# Patient Record
Sex: Male | Born: 1940
Health system: Southern US, Community
[De-identification: ages and names within clinical notes are randomized; demographics above are authoritative.]

## PROBLEM LIST (undated history)

## (undated) DIAGNOSIS — J984 Other disorders of lung: Secondary | ICD-10-CM

## (undated) DIAGNOSIS — R942 Abnormal results of pulmonary function studies: Secondary | ICD-10-CM

## (undated) DIAGNOSIS — G20 Parkinson's disease: Secondary | ICD-10-CM

## (undated) DIAGNOSIS — R011 Cardiac murmur, unspecified: Secondary | ICD-10-CM

## (undated) DIAGNOSIS — F419 Anxiety disorder, unspecified: Secondary | ICD-10-CM

## (undated) DIAGNOSIS — R202 Paresthesia of skin: Secondary | ICD-10-CM

## (undated) DIAGNOSIS — N529 Male erectile dysfunction, unspecified: Secondary | ICD-10-CM

## (undated) DIAGNOSIS — M1990 Unspecified osteoarthritis, unspecified site: Secondary | ICD-10-CM

## (undated) DIAGNOSIS — E785 Hyperlipidemia, unspecified: Secondary | ICD-10-CM

## (undated) DIAGNOSIS — N400 Enlarged prostate without lower urinary tract symptoms: Secondary | ICD-10-CM

## (undated) DIAGNOSIS — K219 Gastro-esophageal reflux disease without esophagitis: Secondary | ICD-10-CM

## (undated) DIAGNOSIS — I4821 Permanent atrial fibrillation: Secondary | ICD-10-CM

## (undated) DIAGNOSIS — L0390 Cellulitis, unspecified: Secondary | ICD-10-CM

## (undated) DIAGNOSIS — I10 Essential (primary) hypertension: Secondary | ICD-10-CM

## (undated) DIAGNOSIS — J309 Allergic rhinitis, unspecified: Secondary | ICD-10-CM

## (undated) HISTORY — DX: Abnormal results of pulmonary function studies: R94.2

## (undated) HISTORY — PX: COLONOSCOPY: SHX174

## (undated) HISTORY — DX: Anxiety disorder, unspecified: F41.9

## (undated) HISTORY — PX: CARDIOVERSION: SHX1299

## (undated) HISTORY — PX: EYE SURGERY: SHX253

## (undated) HISTORY — DX: Other disorders of lung: J98.4

## (undated) HISTORY — DX: Cellulitis, unspecified: L03.90

## (undated) HISTORY — DX: Hyperlipidemia, unspecified: E78.5

## (undated) HISTORY — DX: Allergic rhinitis, unspecified: J30.9

## (undated) HISTORY — DX: Paresthesia of skin: R20.2

## (undated) HISTORY — DX: Permanent atrial fibrillation: I48.21

## (undated) HISTORY — PX: CATARACT EXTRACTION: SUR2

## (undated) HISTORY — DX: Unspecified osteoarthritis, unspecified site: M19.90

## (undated) HISTORY — DX: Benign prostatic hyperplasia without lower urinary tract symptoms: N40.0

## (undated) HISTORY — DX: Essential (primary) hypertension: I10

## (undated) HISTORY — DX: Parkinson's disease: G20

## (undated) HISTORY — DX: Gastro-esophageal reflux disease without esophagitis: K21.9

## (undated) HISTORY — DX: Male erectile dysfunction, unspecified: N52.9

## (undated) HISTORY — PX: JOINT REPLACEMENT: SHX530

---

## 1998-07-12 ENCOUNTER — Encounter: Payer: Self-pay | Admitting: Pulmonary Disease

## 1998-07-12 ENCOUNTER — Ambulatory Visit (HOSPITAL_COMMUNITY): Admission: RE | Admit: 1998-07-12 | Discharge: 1998-07-12 | Payer: Self-pay | Admitting: Pulmonary Disease

## 1999-12-03 ENCOUNTER — Inpatient Hospital Stay (HOSPITAL_COMMUNITY): Admission: EM | Admit: 1999-12-03 | Discharge: 1999-12-07 | Payer: Self-pay | Admitting: *Deleted

## 1999-12-03 ENCOUNTER — Encounter: Payer: Self-pay | Admitting: *Deleted

## 1999-12-04 ENCOUNTER — Encounter: Payer: Self-pay | Admitting: Pulmonary Disease

## 2001-04-08 ENCOUNTER — Ambulatory Visit (HOSPITAL_COMMUNITY): Admission: RE | Admit: 2001-04-08 | Discharge: 2001-04-08 | Payer: Self-pay | Admitting: Cardiology

## 2001-08-18 ENCOUNTER — Ambulatory Visit (HOSPITAL_COMMUNITY): Admission: RE | Admit: 2001-08-18 | Discharge: 2001-08-18 | Payer: Self-pay | Admitting: Cardiology

## 2001-09-16 ENCOUNTER — Ambulatory Visit (HOSPITAL_COMMUNITY): Admission: RE | Admit: 2001-09-16 | Discharge: 2001-09-16 | Payer: Self-pay | Admitting: Gastroenterology

## 2003-06-23 ENCOUNTER — Inpatient Hospital Stay (HOSPITAL_COMMUNITY): Admission: AD | Admit: 2003-06-23 | Discharge: 2003-06-27 | Payer: Self-pay | Admitting: Cardiology

## 2003-08-07 ENCOUNTER — Encounter: Admission: RE | Admit: 2003-08-07 | Discharge: 2003-08-07 | Payer: Self-pay | Admitting: Cardiology

## 2003-08-08 ENCOUNTER — Ambulatory Visit (HOSPITAL_COMMUNITY): Admission: RE | Admit: 2003-08-08 | Discharge: 2003-08-08 | Payer: Self-pay | Admitting: Cardiology

## 2003-08-14 ENCOUNTER — Ambulatory Visit (HOSPITAL_COMMUNITY): Admission: RE | Admit: 2003-08-14 | Discharge: 2003-08-14 | Payer: Self-pay | Admitting: Cardiology

## 2003-12-06 ENCOUNTER — Ambulatory Visit (HOSPITAL_COMMUNITY): Admission: RE | Admit: 2003-12-06 | Discharge: 2003-12-06 | Payer: Self-pay | Admitting: Cardiology

## 2004-07-19 ENCOUNTER — Ambulatory Visit (HOSPITAL_COMMUNITY): Admission: RE | Admit: 2004-07-19 | Discharge: 2004-07-19 | Payer: Self-pay | Admitting: Cardiology

## 2004-08-12 ENCOUNTER — Encounter: Admission: RE | Admit: 2004-08-12 | Discharge: 2004-08-12 | Payer: Self-pay | Admitting: Internal Medicine

## 2004-08-20 ENCOUNTER — Encounter: Admission: RE | Admit: 2004-08-20 | Discharge: 2004-08-20 | Payer: Self-pay | Admitting: Internal Medicine

## 2005-12-19 ENCOUNTER — Ambulatory Visit (HOSPITAL_COMMUNITY): Admission: RE | Admit: 2005-12-19 | Discharge: 2005-12-19 | Payer: Self-pay | Admitting: Cardiology

## 2006-10-15 ENCOUNTER — Encounter: Admission: RE | Admit: 2006-10-15 | Discharge: 2006-10-15 | Payer: Self-pay | Admitting: Internal Medicine

## 2007-02-09 ENCOUNTER — Ambulatory Visit (HOSPITAL_COMMUNITY): Admission: RE | Admit: 2007-02-09 | Discharge: 2007-02-09 | Payer: Self-pay | Admitting: Cardiology

## 2008-03-14 ENCOUNTER — Ambulatory Visit (HOSPITAL_COMMUNITY): Admission: RE | Admit: 2008-03-14 | Discharge: 2008-03-14 | Payer: Self-pay | Admitting: Cardiology

## 2008-03-14 ENCOUNTER — Encounter: Payer: Self-pay | Admitting: Pulmonary Disease

## 2008-05-15 ENCOUNTER — Ambulatory Visit: Payer: Self-pay | Admitting: Pulmonary Disease

## 2008-05-15 DIAGNOSIS — I10 Essential (primary) hypertension: Secondary | ICD-10-CM

## 2008-05-15 DIAGNOSIS — R942 Abnormal results of pulmonary function studies: Secondary | ICD-10-CM | POA: Insufficient documentation

## 2008-05-15 DIAGNOSIS — E785 Hyperlipidemia, unspecified: Secondary | ICD-10-CM

## 2008-05-18 ENCOUNTER — Ambulatory Visit: Payer: Self-pay | Admitting: Internal Medicine

## 2008-05-18 ENCOUNTER — Ambulatory Visit: Payer: Self-pay | Admitting: Pulmonary Disease

## 2008-05-29 ENCOUNTER — Encounter: Payer: Self-pay | Admitting: Pulmonary Disease

## 2008-06-02 ENCOUNTER — Ambulatory Visit: Payer: Self-pay | Admitting: Pulmonary Disease

## 2008-06-02 DIAGNOSIS — R0989 Other specified symptoms and signs involving the circulatory and respiratory systems: Secondary | ICD-10-CM

## 2008-06-02 DIAGNOSIS — R0609 Other forms of dyspnea: Secondary | ICD-10-CM

## 2008-06-02 DIAGNOSIS — J984 Other disorders of lung: Secondary | ICD-10-CM

## 2008-11-30 ENCOUNTER — Ambulatory Visit: Payer: Self-pay | Admitting: Pulmonary Disease

## 2008-11-30 ENCOUNTER — Ambulatory Visit: Payer: Self-pay | Admitting: Cardiology

## 2008-12-01 ENCOUNTER — Encounter: Payer: Self-pay | Admitting: Pulmonary Disease

## 2009-11-22 HISTORY — PX: CARDIAC CATHETERIZATION: SHX172

## 2010-07-31 ENCOUNTER — Encounter: Payer: Self-pay | Admitting: Internal Medicine

## 2010-08-06 ENCOUNTER — Ambulatory Visit (HOSPITAL_COMMUNITY)
Admission: RE | Admit: 2010-08-06 | Discharge: 2010-08-06 | Disposition: A | Discharge status: Home / Self Care | Payer: Medicare Other | Source: Ambulatory Visit | Attending: Cardiology | Admitting: Cardiology

## 2010-08-06 DIAGNOSIS — I4891 Unspecified atrial fibrillation: Secondary | ICD-10-CM | POA: Insufficient documentation

## 2010-08-06 LAB — PROTIME-INR
INR: 2.2 — ABNORMAL HIGH (ref 0.00–1.49)
Prothrombin Time: 24.6 seconds — ABNORMAL HIGH (ref 11.6–15.2)

## 2010-08-06 LAB — MAGNESIUM: Magnesium: 1.7 mg/dL (ref 1.5–2.5)

## 2010-08-09 NOTE — Discharge Summary (Signed)
[place]. [facility]  Patient:    Brian [name], Brian [name]                        MRN: [id number] Adm. Date:  [id number] Disc. Date: [id number] Attending:  Eino Farber Dictator:   Dionne D. Su Hilt, N.P.                           Discharge Summary  ADMISSION DIAGNOSES: 1. Atrial fibrillation. 2. Arrhythmias with sinus pauses. 3. Hypertensive cardiovascular disease. 4. Hyperlipidemia. 5. Hypokalemia.  DISCHARGE DIAGNOSES: 1. Atrial fibrillation converted to sinus rhythm. 2. Hypertensive cardiovascular disease. 3. Hypercholesterolemia. 4. Hyperkalemia. 5. Obesity, moderate.  REASON FOR HOSPITALIZATION:  This is a 70 year old black male who states he did three days of hard workout Thursday, Friday, and Saturday.  States that he normally has days off between workout.  He noticed on Friday evening as well as Saturday and Sunday after where he thought his heart was beating fast. States it was more ______ pounding in his throat.  He states he felt like his heart was out of synch with his body, did not quite feel right.  These episodes usually came on when he was doing something strenuous.  Also when he was up the steps, lasted a few minutes, and subsided on their own.  He had a similar episode 10 years ago when he was in [place].  He had an irregular heart rate, but the heart spontaneously converted to a normal sinus rhythm. Two years ago he states this is in his record with Dr. Petra Kuba.  He noticed over the last two or three days he has been sweating.  He has taken a lot longer to cool down.  He had to get in front of the air conditioner to cool down while he was at home after his shower.  He had no nausea or vomiting.  ALLERGIES:  No known drug allergies.  LABORATORY DATA AND STUDIES:  On December 03, 1999, EKG showed atrial fibrillation.  Chest x-ray on December 03, 1999, showed myocardial ______, but no active disease.  On December 03, 1999,  WBCs 4.5, hemoglobin 15.1, hematocrit 42.0, platelets 215.  Chemistry studies on December 03, 1999, showed sodium of 139, potassium 3.1, chloride 102, CO2 28, glucose 99, BUN 21, creatinine 1.2, calcium 9.7, total protein 7.2, albumin 3.8, AST 27, ALT 24, ALP 51, total bilirubin 0.7.  On December 07, 1999, chemistry studies showed a sodium of 135, potassium of 4.2, chloride 102, CO2 28, glucose 98, BUN 12, creatinine 1.2, calcium 8.8.  Cardiac enzymes on December 03, 1999, showed a CK of 189, CK-MB 1.8, index of 1.0, troponin less than 0.03.  On December 03, 1999, T4 was 8.2, TSH 1.2, T3 150.4.  HOSPITAL COURSE:  The patient was admitted to a telemetry bed.  The patient was started on Prinivil and Lipitor and a baby aspirin, and Lovenox 60 mg q.12h.  Also he received Lopressor 25 mg p.o. q.12h.  Cardiac enzymes were obtained with troponins.  The patient had an episode of hypokalemia.  He was given potassium supplement orally and intravenously.  Chemistry studies were drawn and monitored at intervals.  Cardiology consult was done by Dr. Donia Guiles.  Adenosine Cardiolite was obtained, and also a 2-D echo.  Thyroid profile was also drawn.  The patient had an episode of bradycardia.  Lopressor was held.  The patient was started on Coumadin  6 mg.  He also had a TEE done with cardioversion.  Lopressor was restarted 25 mg tab p.o. q.d.  CONDITION ON DISCHARGE:  The patient was discharged home in stable condition. The patient is doing well and tolerating medications post cardioversion.  DISCHARGE MEDICATIONS: 1. Coumadin 5 mg tab one p.o. q.d. 2. Lopressor 50 mg tab 1/2 tab q.d. 3. Prinivil 20 mg tab one q.d. 4. KCL 20 mEq tab one q.d. 5. Baby aspirin 81 mg tab one q.d. 6. Lipitor 10 mg tab one q.d.  ACTIVITY:  Ambulate as tolerated.  DIET:  Low fat, low cholesterol diet, no added salt diet.  FOLLOWUP APPOINTMENT:  In 7 to 10 days.  Need blood, chemistry, CBC,  and prothrombin. DD:  01/14/00 TD:  01/15/00 Job: [id number]

## 2010-08-09 NOTE — H&P (Signed)
[place]. [facility]  Patient:    Brian [name], Brian [name] Visit Number: [id number] MRN: [id number]          Service Type: CAT Location: [facility] 2871 01 Attending Physician:  Armanda Magic Dictated by:   Anselm Lis, N.P. Admit Date:  04/08/2001 Discharge Date: 04/08/2001                           History and Physical  DATE OF BIRTH:  1940/10/30  PRIMARY CARE Leyana Whidden:  Dr. Tyson Dense.  DATE OF PROCEDURE:  April 08, 2001  PROBLEMS:  1. History of paroxysmal atrial fibrillation.     a. Episode in [place] about eight years ago.     b. (September 2001) [facility] admission where he underwent direct-current        cardioversion (Dr. Doylene Canning. Ladona Ridgel).  Stress Cardiolite,        2-D echocardiogram were done at that time.     c. March 12, 2001, 2-D echocardiogram revealing normal left ventricular        cavity dimensions with moderate left ventricular hypertrophy.        Moderate-to-marked left atrial enlargement.  Upper-limits-of-normal        right ventricular size and mild right atrial enlargement.  Mild aortic        valve sclerosis, no stenosis.  Mildly thickened anterior mitral valve        leaflet with trace mitral regurgitation.  Mild tricuspid regurgitation.        Left atrial enlargement -- 57 mm.  In comparison with        2-D echocardiogram, September 2001, left atrium appears to have        increased in size.     d. Recent stress Cardiolite revealing decreased uptake, inferior wall,        with stress only.  ST segment depression at peak exercise.  No        complaint of chest pain nor shortness of breath.  2. History of hypertension for approximately 10 years.  3. History of dyslipidemia, treated for two years.  4. History of gastroesophageal reflux disease, treated for Helicobacter     pylori about 10 years earlier.  CURRENT MEDICATIONS:  1. Lopressor 50 mg p.o. q.h.s.  2. Lipitor 10 mg p.o. q.d.  3. Lisinopril/HCTZ 20/25 mg p.o.  q.d.  4. Coumadin -- last dose April 04, 2001.  5. Tylenol PM one dose every evening and one dose prior to his     four-to-five-times-per-week cardiovascular exercises.  HISTORY OF PRESENT ILLNESS:  [name] is a very pleasant 70 year old gentleman with a history of paroxysmal atrial fibrillation as detailed above. He had recurrence of his atrial fibrillation sometime post cardioversion, September 2001.  He has been on systemic anticoagulation for this.  He had an echocardiogram and stress Cardiolite recently.  The Cardiolite was suspicious for inferior ischemia.  The echo revealed moderate left ventricular hypertrophy and marked left atrial enlargement thought likely secondary to underlying hypertension.  He has been counseled for and has agreed to undergo coronary angiography.  ALLERGIES:  No known drug allergies.  No problem with seafood, shellfish nor with iodinated products.  SOCIAL HISTORY AND HABITS:  Patient has been married for 18 years (second marriage).  He has four children, ages 49, 70, 52, 64, all alive and well. Smoking/ETOH:  Negative.  Exercise:  Works out four to five times per week with cardiovascular  exercises.  Caffeine:  None.  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  REVIEW OF SYSTEMS:  He does have episodic transient light-headedness with fast position changes.  Denies anterior chest pressure nor tightness.  Negative dysphagia to food or fluid.  Negative symptoms of GERD.  Good dentition though he does have upper and lower partials.  Denies melena nor bright red blood PR. Negative constipation nor diarrhea.  He did recover recently from a 24-hour intestinal flu bug 2 days earlier.  Negative dysuria nor hematuria. Arthritic-type complaints:  Just early morning stiffness; it is improved if he takes Tylenol PM the night before.  Denies pedal edema, orthopnea, PND nor DOE.  He does snore but no apneic periods per his  wife.  PHYSICAL EXAMINATION:  (As performed by Dr. Armanda Magic.)  VITAL SIGNS:  Blood pressure is 122/72, heart rate regular, respiratory rate 12.  He is afebrile.  His weight is 250 pounds.  He is 6 feet 2 inches.  GENERAL:  He is a well-nourished 70 year old gentleman in no apparent distress.  His wife is in attendance.  HEENT/NECK:  Brisk bilateral carotid upstrokes without bruits.  No significant JVD nor thyromegaly.  CARDIAC:  Regular rate and rhythm without murmur, rub nor gallop.  Normal S1 and S2.  CHEST:  Lung sounds clear with equal bilateral excursion.  Negative CVA tenderness.  ABDOMEN:  Soft, nondistended, normoactive bowel sounds.  Negative abdominal aortic, renal nor femoral bruit.  Nontender to applied pressure.  No masses nor organomegaly appreciated.  EXTREMITIES:  Distal pulses intact.  Negative pedal edema.  Pedal pulses are decreased.  NEUROLOGIC:  Cranial nerves II-XII grossly intact, alert and oriented x3.  GENITAL:  Deferred.  RECTAL:  Deferred.  LABORATORY TESTS AND DATA:  WBC of 3.8, hemoglobin 14.6, hematocrit of 43.1, platelets of 183,000; those labs were from today.  From March 30, 2001, glucose of 123, BUN of 31, creatinine 1.5, sodium 136, potassium of 4.3, chloride of 98, CO2 of 28, calcium of 9.1; hemoglobin 14.8, hematocrit of 43.1, platelets of 201,000, WBC 4.3.  Pro time 15.5, INR of 1.73, PTT of 33.  Chest x-ray was done today and is pending.  EKG from February 11, 2001 revealed atrial fibrillation with controlled ventricular response, 78 beats per minute, without ischemic changes.  IMPRESSION:  (As dictated by Dr. Armanda Magic.)  1. History of paroxysmal atrial fibrillation, now recurrent and continuous.  2. Systemic anticoagulation secondary to atrial fibrillation; last dose taken     four day prior to today.  3. History of hyperlipidemia; on Lipitor.  4. History of hypertension, good control on current medical regimen.  5.  Recent abnormal stress Cardiolite suggestive of inferior ischemia with      some diagnostic electrocardiographic changes of ischemia at peak exercise.  PLAN:  (As dictated by Dr. Armanda Magic.)  1. Patient was counseled to undergo and has accepted plans for coronary     angiography with possible percutaneous intervention if indicated and able.     Risks, potential complications, benefits and alternatives to procedure     were discussed in detail.  Mr. Dibartolo and his wife indicate their questions     and concerns have been addressed and are agreeable to proceed.  2. Awaiting STAT PT/INR results before proceeding. Dictated by:   Anselm Lis, N.P. Attending Physician:  Armanda Magic DD:  04/08/01 TD:  04/08/01 Job: [id number]

## 2010-08-09 NOTE — Procedures (Signed)
[place]. [facility]  Patient:    Brian, [name] Visit Number: [id number] MRN: [id number]          Service Type: END Location: ENDO Attending Physician:  Dennison Bulla Ii Dictated by:   Verlin Grills, M.D. Proc. Date: 09/16/01 Admit Date:  09/16/2001 Discharge Date: 09/16/2001                             Procedure Report  REFERRING PHYSICIAN:  Tyson Dense, M.D.  PROCEDURE:  Screening colonoscopy.  PROCEDURE INDICATION:  Brian [name] is a 70 year old male who is referred for his first screening colonoscopy with polypectomy to prevent colon cancer.  ENDOSCOPIST:  Verlin Grills, M.D.  PREMEDICATION:  Versed 10 mg, fentanyl 50 mcg.  ENDOSCOPE:  Olympus pediatric colonoscope.  DESCRIPTION OF PROCEDURE:  After obtaining informed consent, Brian [name] was placed in the left lateral decubitus position.  I administered intravenous fentanyl and intravenous Versed to achieve conscious sedation for the procedure.  The patients blood pressure, oxygen saturation, and cardiac rhythm were monitored throughout the procedure and documented in the medical record.  Anal inspection was normal.  Digital rectal exam revealed a non-nodular prostate.  The Olympus pediatric video colonoscope was introduced into the rectum and easily advanced to the cecum.  Colonic preparation for the exam today was excellent.  Rectum normal.  Sigmoid colon and descending colon normal.  Splenic flexure normal.  Transverse colon normal.  Hepatic flexure normal.  Ascending colon normal.  Cecum and ileocecal valve normal.  ASSESSMENT:  Normal screening proctocolonoscopy to the cecum.  No endoscopic evidence for the presence of colorectal neoplasia. Dictated by:   Verlin Grills, M.D. Attending Physician:  Dennison Bulla Ii DD:  09/16/01 TD:  09/18/01 Job: [id number]

## 2010-08-09 NOTE — Op Note (Signed)
NAME:  Brian [name], Brian [name]                           ACCOUNT NO.:  [id number]   MEDICAL RECORD NO.:  [id number]                   PATIENT TYPE:  OIB   LOCATION:  2899                                 FACILITY:  [facility]   PHYSICIAN:  Armanda Magic, M.D.                  DATE OF BIRTH:  1940/04/15   DATE OF PROCEDURE:  08/14/2003  DATE OF DISCHARGE:  08/14/2003                                 OPERATIVE REPORT   REFERRING PHYSICIAN:  Georgann Housekeeper, M.D.   PROCEDURE:  Direct current cardioversion.   OPERATOR:  Armanda Magic, M.D.   INDICATIONS FOR PROCEDURE:  Atrial fibrillation with therapeutic INR.   COMPLICATIONS:  None.   MEDICATIONS:  Pentothal 375 mg.   This is a 70 year old male with a history of paroxysmal atrial fibrillation  with multiple attempts at keeping in sinus rhythm after cardioversion.  He  is now status post Amiodarone loading and has a therapeutic INR and presents  for cardioversion.  The patient was brought to the hospital in a fasting  nonsedated state.  Informed consent was obtained.  The patient was connected  to continuous heart rate and pulse oximetry monitoring and intermittent  blood pressure monitoring.  After adequate anesthesia was obtained, a  synchronized biphasic 100 joule shock was delivered.  It was unsuccessful at  converting.  A second 150 joule synchronized biphasic shock was then  delivered successfully converting the patient to normal sinus rhythm.  The  patient tolerated the procedure well.   ASSESSMENT:  1. Atrial fibrillation.  2. Systemic anticoagulation with therapeutic INR for greater than four     weeks.  3. Successful direct current cardioversion to sinus rhythm.   PLAN:  Discharge to home after ambulating and fully awake.  Follow up with  Dr. Mayford Knife in three weeks.  Decrease Coumadin to 5 mg every other day  alternating with 2.5 mg every other day due to INR being 3.9 today.  PT and  INR check on Thursday in our Coumadin clinic.                                             Armanda Magic, M.D.   TT/MEDQ  D:  08/14/2003  T:  08/14/2003  Job:  [id number]   cc:   Georgann Housekeeper, M.D.  [address]  [place]  [place] [address]  Fax: [phone]

## 2010-08-09 NOTE — Procedures (Signed)
[facility]  Patient:    Brian [name], Brian [name] Visit Number: [id number] MRN: [id number]          Service Type: CAT Location: [facility] 2853 01 Attending Physician:  Armanda Magic Dictated by:   Armanda Magic, M.D. Proc. Date: 08/18/01 Admit Date:  08/18/2001 Discharge Date: 08/18/2001   CC:         Tyson Dense, M.D.   Procedure Report  DATE OF BIRTH:  Jan 01, 1941.  PROCEDURE:  Direct current cardioversion.  OPERATOR:  Armanda Magic, M.D.  INDICATION:  Atrial fibrillation on Coumadin, with INR greater than or equal to 2 for four weeks.  COMPLICATIONS:  None.  IV MEDICATIONS:  Pentothal 325 mg IV.  CLINICAL NOTE:  This is a 70 year old black male with a history of atrial fibrillation.  He underwent cardiac catheterization showing normal coronary arteries and normal LV systolic function.  Left atrium is moderately enlarged at 57 mm by 2 D echocardiogram.  He also has moderate LVH and normal systolic function.  He has been on Lopressor and Coumadin.  Once therapeutic with an INR of greater than or equal to 2 for four weeks, he now presents for DC cardioversion.  DESCRIPTION OF PROCEDURE:  The patient was in the fasting, nonsedated state, and was connected to continuous heart rate and pulse oximetry monitor and intermittent blood pressure monitoring.  After informed consent was obtained, anesthesia administered pentothal 325 mg IV.  After adequate anesthesia was obtained, a synchronized 150-joule shock was delivered, which successfully converted the patient to sinus bradycardia with heart rates of 50-60 beats per minute.  The patient tolerated the procedure well.  ASSESSMENT: 1. Atrial fibrillation. 2. Systemic anticoagulation. 3. Status post successful direct current cardioversion to normal sinus rhythm,    sinus bradycardia, heart rates 49-60 beats per minute.  PLAN:  Discharge home after fully awake.  Decrease Lopressor to 12.5 mg b.i.d., and  follow up with Dr. Mayford Knife in two weeks. Dictated by:   Armanda Magic, M.D. Attending Physician:  Armanda Magic DD:  08/18/01 TD:  08/19/01 Job: [id number]

## 2010-08-09 NOTE — Cardiovascular Report (Signed)
[place]. [facility]  Patient:    Brian [name], Brian [name] Visit Number: [id number] MRN: [id number]          Service Type: CAT Location: [facility] 2871 01 Attending Physician:  Armanda Magic Dictated by:   Armanda Magic, M.D. Proc. Date: 04/08/01 Admit Date:  04/08/2001 Discharge Date: 04/08/2001   CC:         Tyson Dense, M.D.   Cardiac Catheterization  REFERRING PHYSICIAN: Tyson Dense, M.D.  HISTORY OF PRESENT ILLNESS: This is very pleasant 70 year old black male, who initially saw me for a paroxysmal atrial fibrillation. He was noted to have an abnormal stress test with significant ST segment depression at peak exercise. A 2-D echocardiogram showed moderate LV hypertrophy with moderate to marked left atrial enlargement with thickening of the mitral valve leaflet and trace MR. No mitral valve prolapse. He had an abnormal Cardiolite study and now presents for cardiac catheterization.  PROCEDURES: Left heart catheterization, coronary angiography, left ventriculography.  INDICATIONS: Atrial fibrillation, abnormal Cardiolite.  INTERVENOUS ACCESS: Via right femoral artery, 6 French sheath.  COMPLICATIONS: None.  DESCRIPTION OF PROCEDURE: The patient is brought to the cardiac catheterization laboratory in a fasting, nonsedated state.  Informed consent was obtained.  The patient was connected to continuous heart rate and pulse oximetry monitoring, and intermittent blood pressure monitoring.  The right groin was prepped and draped in a sterile fashion.  Xylocaine 1% was used for local anesthesia.  Using the modified Seldinger technique, a 6 French sheath was placed in the right femoral artery.  Under fluoroscopic guidance, a 6 [place] JL4 catheter was placed in the left coronary artery. Under fluoroscopic guidance multiple cine films were taken in a 30 degree RAO, 40 degree LAO views.  This catheter was then exchanged out over a wire and a 6 [place] JR4 catheter  which was placed under fluoroscopic guidance in the right coronary artery. Multiple cine films were taken in a 30 degree RAO, 40 degree LAO views. This catheter was then exchanged out over a guide wire for a 6 French angled pigtail catheter which was placed under fluoroscopic guidance in the left ventricular cavity. Left ventriculography was performed in a 30 degree RAO view using a total of 30 cc of contrast at 13 cc/sec.  The catheter was then pulled back across the aortic valve with no significant gradient noted. At the end of the procedure, all catheters and sheaths were removed. Manual compression was performed until adequate hemostasis was obtained. The patient was transferred back to his room in stable condition.  RESULTS: 1. The left main coronary artery is widely patent and trifurcates into    a left anterior descending artery, ramus branch and left circumflex    artery. The ramus branch is widely patent and proximally bifurcates into    two large daughter branches which are widely patent. 2. The left anterior descending artery is widely patent proximally and    gives rise to two diagonal branches. In between the first and second    diagonal there is a 30% narrowing, otherwise widely patent. 3. The left circumflex is widely patent throughout its course and gives rise    to two obtuse marginal branches which are widely patent. 4. The right coronary artery is widely patent throughout its course and    bifurcates distally in the posterior descending artery and posterolateral    artery, both of which are widely patent.  LEFT VENTRICULOGRAM: The left ventriculography performed in the 30 degree RAO vies using a  total of 30 cc of contrast at 13 cc/sec. showed low-normal LV function with an EF of 50-55% and mild mitral regurgitation.  ASSESSMENT: 1. Paroxysmal atrial fibrillation. 2. Nonobstructive coronary disease. 3. Low-normal left ventricular function. 4. Mild mitral  regurgitation with a history of thickened anterior    mitral valve leaflet by echocardiogram.  PLAN: 1. Discharge to home after IV fluid and bed rest. 2. Restart Coumadin tonight. 3. Followup with Dr. Mayford Knife in two weeks. 4. PT/INR check on Monday. 5. SBE prophylaxis secondary to MR and thickened mitral valve leaflet. Dictated by:   Armanda Magic, M.D. Attending Physician:  Armanda Magic DD:  04/08/01 TD:  04/09/01 Job: [id number]

## 2010-08-09 NOTE — Discharge Summary (Signed)
NAME:  RICHARDSON, DUBREE                           ACCOUNT NO.:  [id number]   MEDICAL RECORD NO.:  [id number]                   PATIENT TYPE:  INP   LOCATION:  3736                                 FACILITY:  [facility]   PHYSICIAN:  Armanda Magic, M.D.                  DATE OF BIRTH:  1940-10-07   DATE OF ADMISSION:  06/23/2003  DATE OF DISCHARGE:  06/27/2003                                 DISCHARGE SUMMARY   HISTORY OF PRESENT ILLNESS AND REASON FOR ADMISSION:  Mr. Sowles is a 70-year-  old male patient with a known history of recurrent paroxysmal atrial  fibrillation.  He had cardioversion done in 2001 and back in September 2004  was maintaining sinus rhythm.  Subsequently the patient had been  experiencing palpitations and after evaluation per Dr. Mayford Knife was found to  be back in atrial fibrillation.  He was placed on Coumadin and his rate was  well controlled with Lopressor.  After discussion, the patient opted to come  into the hospital for Betapace loading in attempt for chemical cardioversion  with Betapace.  He was also made aware that if he had not converted on his  own, that Dr. Mayford Knife will consider doing an electric cardioversion prior to  discharge.  Now that the patient's INR was greater than 2.0, he therefore  was admitted to [facility] for Betapace loading.   HOSPITAL COURSE:  Recurrent atrial fibrillation - Admission for Betapace  loading.  The patient was admitted to the general telemetry unit where he  was started on Betapace 80 mg b.i.d.  By the next day, it was noted that he  had subsequently developed significant bradyarrhythmias after first dose of  Betapace.  Betapace was subsequently held for 24 hours.  If his heart rate  continued to decrease, there was some consideration about changing the  patient over to amiodarone.  He continued to have episodes of bradycardia  and pauses and therefore Betapace was discontinued and he was started on  amiodarone 400 mg b.i.d.  As per  routine, TSH, LFTs, PFTs were done pre  treatment and daily EKGs were monitored to follow QTC.  The patient remained  in atrial fibrillation without any further episodes of significant  bradyarrhythmias or pauses.  He had one short episode of heart rate in the  40s while sleeping.  He remained therapeutic on his INR.  Due to one episode  of decreased heart rate into the 40s amiodarone was decreased to daily  dosing.  While the patient went to the respiratory therapy department for  PFTs measurements, he experienced transient uncontrolled ventricular rate  with a heart rate in the 130s.  He also experienced wide-complex tachycardia  that Dr. Mayford Knife later determined to be atrial fibrillation with aberrant  conduction.  Subsequently the patient has remained stable with daily  amiodarone.  QTC was 438 ms on April 5.  At that  point Dr. Mayford Knife determined  that the patient was safe for discharge home and discharge recommendations  will be noted in the discharge instructions later in the dictation.   FINAL DISCHARGE DIAGNOSES:  1. Paroxysmal atrial fibrillation with recurrent atrial fibrillation, status     post amiodarone loading.  2. Hypertension, controlled.  3. Dyslipidemia.  4. Erectile dysfunction.  5. Chronic anticoagulation.   DISCHARGE MEDICATIONS:  1. Pravachol 40 mg daily.  2. Amiodarone 200 mg daily.  3. Avalide 300/12.5 mg daily.  4. Coumadin 5 mg daily.  5. Stop Diovan 160/HCT 12.5 for now.  6. Stop Lipitor.  7. Stop Lopressor.   ACTIVITY:  As previous.   DIET:  Cardiac with Coumadin precautions, avoid excessive intake of leafy,  green vegetables.   FOLLOW-UP APPOINTMENTS:  1. Dr. Mayford Knife, Thursday, April 21, at 11:30 a.m.  2. EKG Friday at the [facility] Office on April 8, at 10:30 a.m.  3. Coumadin Clinic, Friday, April 15, at 2:00 p.m.  4. Statin panel in six weeks on Tuesday, May 17.  The patient has been     instructed to have nothing to eat or drink after  midnight before the     statin and nothing to eat or drink the morning of the lab.      Allison L. Ulla Gallo, M.D.    ALE/MEDQ  D:  07/24/2003  T:  07/25/2003  Job:  [id number]

## 2010-08-13 NOTE — Op Note (Signed)
  NAMELEO, FRAY                 ACCOUNT NO.:  [id number]  MEDICAL RECORD NO.:  [id number]           PATIENT TYPE:  O  LOCATION:  [facility]                         FACILITY:  [facility]  PHYSICIAN:  Armanda Magic, M.D.     DATE OF BIRTH:  12-26-1940  DATE OF PROCEDURE: DATE OF DISCHARGE:  08/06/2010                              OPERATIVE REPORT   REFERRING PHYSICIAN:  Georgann Housekeeper, MD  PROCEDURE:  Direct current cardioversion.  OPERATION:  Armanda Magic, MD  INDICATIONS:  Atrial fibrillation.  COMPLICATIONS:  None.  IV MEDICATIONS:  Propofol 150 mg.  This is a 70 year old African American male with a history of paroxysmal atrial fibrillation status post 2 cardioversions in the past, hypertension, normal coronary arteries in the past, dyslipidemia, GERD who presented for routine office visit and was found to be back in atrial fibrillation.  He was asymptomatic except for some occasional numbness and tingling in his face and hands.  He has been on systemic anticoagulation for some time and he has had therapeutic INRs over the past several months.  He now presents for cardioversion.  The patient was brought to the day hospital in a fasting nonsedated state.  Informed consent was obtained.  The patient was connected to continuous heart rate, pulse oximetry monitoring and intermittent blood pressure monitor.  Defibrillator pads were placed on the left anterior chest and posterior back.  After adequate anesthesia was obtained, a 150 joules synchronized biphasic shock was delivered which was unsuccessful in converting the patient to sinus rhythm.  A 200 joules synchronized biphasic shock was then delivered which again was unsuccessful in converting the patient to sinus rhythm.  A second attempt to the 200 joules synchronized biphasic shock was delivered again unsuccessful in converting the patient to sinus rhythm.  The patient tolerated the procedure well without complications.  He was  later discharged to home. 1. Atrial fibrillation. 2. Systemic anticoagulation with therapeutic INR greater than 4 weeks. 3. Hypertension.  PLAN:  We will need to go on antiarrhythmic drug therapy.  We will check an outpatient Lexiscan Cardiolite and 2-D echocardiogram.  If these are normal, then we will start outpatient flecainide with subsequent cardioversion in 2-3 weeks.     Armanda Magic, M.D.     TT/MEDQ  D:  08/06/2010  T:  08/07/2010  Job:  [id number]  Electronically Signed by Armanda Magic M.D. on 08/13/2010 01:41:03 PM

## 2010-08-14 ENCOUNTER — Encounter: Payer: Self-pay | Admitting: Internal Medicine

## 2010-08-20 ENCOUNTER — Encounter: Payer: Self-pay | Admitting: Internal Medicine

## 2010-09-09 ENCOUNTER — Encounter: Payer: Self-pay | Admitting: Internal Medicine

## 2010-09-10 ENCOUNTER — Ambulatory Visit (HOSPITAL_COMMUNITY)
Admission: RE | Admit: 2010-09-10 | Discharge: 2010-09-10 | Disposition: A | Discharge status: Home / Self Care | Payer: Medicare Other | Source: Ambulatory Visit | Attending: Cardiology | Admitting: Cardiology

## 2010-09-10 DIAGNOSIS — K219 Gastro-esophageal reflux disease without esophagitis: Secondary | ICD-10-CM | POA: Insufficient documentation

## 2010-09-10 DIAGNOSIS — E785 Hyperlipidemia, unspecified: Secondary | ICD-10-CM | POA: Insufficient documentation

## 2010-09-10 DIAGNOSIS — Z79899 Other long term (current) drug therapy: Secondary | ICD-10-CM | POA: Insufficient documentation

## 2010-09-10 DIAGNOSIS — I129 Hypertensive chronic kidney disease with stage 1 through stage 4 chronic kidney disease, or unspecified chronic kidney disease: Secondary | ICD-10-CM | POA: Insufficient documentation

## 2010-09-10 DIAGNOSIS — N400 Enlarged prostate without lower urinary tract symptoms: Secondary | ICD-10-CM | POA: Insufficient documentation

## 2010-09-10 DIAGNOSIS — Z01812 Encounter for preprocedural laboratory examination: Secondary | ICD-10-CM | POA: Insufficient documentation

## 2010-09-10 DIAGNOSIS — I4891 Unspecified atrial fibrillation: Secondary | ICD-10-CM | POA: Insufficient documentation

## 2010-09-10 DIAGNOSIS — N181 Chronic kidney disease, stage 1: Secondary | ICD-10-CM | POA: Insufficient documentation

## 2010-09-10 DIAGNOSIS — Z7901 Long term (current) use of anticoagulants: Secondary | ICD-10-CM | POA: Insufficient documentation

## 2010-09-10 DIAGNOSIS — Z01810 Encounter for preprocedural cardiovascular examination: Secondary | ICD-10-CM | POA: Insufficient documentation

## 2010-09-10 LAB — PROTIME-INR: Prothrombin Time: 32.2 seconds — ABNORMAL HIGH (ref 11.6–15.2)

## 2010-09-17 NOTE — Op Note (Signed)
  NAMESIDDIQ, KALUZNY                 ACCOUNT NO.:  [id number]  MEDICAL RECORD NO.:  [id number]  LOCATION:  [facility]                         FACILITY:  [facility]  PHYSICIAN:  Armanda Magic, M.D.     DATE OF BIRTH:  09-08-1940  DATE OF PROCEDURE:  09/10/2010 DATE OF DISCHARGE:  09/10/2010                              OPERATIVE REPORT   PROCEDURE:  Direct current cardioversion.  OPERATOR:  Armanda Magic, MD  INDICATIONS:  Atrial fibrillation.  COMPLICATIONS:  None.  MEDICATIONS:  Propofol 120 mg IV.  REFERRING PHYSICIAN:  Georgann Housekeeper, MD  This is a 70 year old male with a history of hypertension, paroxysmal atrial fibrillation, status post several cardioversions in the past, normal coronary arteries by cath in 2011, who presented with recurrent AFib and underwent cardioversion which was unsuccessful.  He was subsequently put on flecainide 50 mg b.i.d.  A exercise treadmill test showed frequent ventricular ectopy versus AFib with aberration.  He now presents back for cardioversion and then plan was to repeat exercise treadmill test to see if this was actually aberration from his AFib or actual PVCs.  The patient was brought to the day hospital in the fasting nonsedated state.  Informed consent was obtained.  The patient was connected to continuous heart rate, pulse oximetry monitoring and intermittent blood pressure monitor.  Defibrillator pads were placed in the left anterior and posterior chest.  After adequate anesthesia was obtained, a 150 joules synchronized biphasic shock was delivered which was unsuccessful in converting the patient's sinus rhythm.  A 200 joules synchronized biphasic shock was delivered which was unsuccessful and was repeated again and again was unsuccessful in converting the patient's sinus rhythm.  The patient was later discharged to home after fully awake and ambulating.  ASSESSMENT: 1. Atrial fibrillation, status post flecainide load. 2. Systemic  anticoagulation with therapeutic INR for greater than 3     months. 3. Unsuccessful cardioversion to sinus rhythm.  PLAN:  Discharged home after fully awake.  We will discontinue flecainide given the possibility that he is having some proarrhythmia based on his recent exercise treadmill test.  We will continue Bystolic and we will discuss further with EP further antiarrhythmic drug therapy.     Armanda Magic, M.D.     TT/MEDQ  D:  09/10/2010  T:  09/11/2010  Job:  [id number]  cc:   Georgann Housekeeper, MD  Electronically Signed by Armanda Magic M.D. on 09/17/2010 10:02:35 PM

## 2010-10-10 ENCOUNTER — Encounter: Payer: Self-pay | Admitting: Internal Medicine

## 2010-10-11 ENCOUNTER — Encounter: Payer: Self-pay | Admitting: *Deleted

## 2010-10-14 ENCOUNTER — Ambulatory Visit (INDEPENDENT_AMBULATORY_CARE_PROVIDER_SITE_OTHER): Payer: Medicare Other | Admitting: Internal Medicine

## 2010-10-14 ENCOUNTER — Encounter: Payer: Self-pay | Admitting: Internal Medicine

## 2010-10-14 DIAGNOSIS — I4891 Unspecified atrial fibrillation: Secondary | ICD-10-CM

## 2010-10-14 DIAGNOSIS — I10 Essential (primary) hypertension: Secondary | ICD-10-CM

## 2010-10-14 NOTE — Assessment & Plan Note (Signed)
The patient has persistent atrial fibrillation.  He has had multiple recent cardioversions which were unsuccessful in restoring sinus rhythm.  He previously was treated with amiodarone however this was discontinued.  He reports having thyroid changes with amiodarone.  At this time, he is well rate controlled and appropriately anticoagualted with coumadin.  He is asymptomatic. Therapeutic strategies for afib including medicine and ablation were discussed in detail with the patient today. Risk, benefits, and alternatives to EP study and radiofrequency ablation for afib were also discussed in detail today.  Given his persistent afib refractory to cardioversion, his anticipated success rates from ablation are on the order of 50%.  As he is asymptomatic, I would not advised ablation at this time. We discussed the AFFIRM trial and strategies of rate vs rhythm control.  As he is asymptomatic, I would like to continue rhythm control.  I think that this is a very appropriate strategy.  No changes were made today. He will follow-up with Dr Mayford Knife and I will see him as needed.

## 2010-10-14 NOTE — Assessment & Plan Note (Signed)
Above goal He will keep a log of his blood pressure readings and follow-up with Dr Mayford Knife.

## 2010-10-14 NOTE — Progress Notes (Signed)
Brian [name] is a pleasant 70 y.o. yo patient with a h/o persistent atrial fibrillation who presents today for EP consultation.  He reports initially being diagnosed with atrial fibrillation around 1995 when he presented to his primary care doctor's office with fatigue.  He was living in [place] at that time.  He states that he was about to be cardioverted when he returned to sinus rhythm.  He did well until 9/01 and was found to have recurrence of afib at that time.  He required cardioversion at that time.  He reports being treated with amiodarone and coumadin at that time.  He required cardioversions 2003 and 2005.  He did very well and eventually was taken off of amiodarone a year or so ago.  He did not have any trouble on amiodarone but stopped the medicine as he had had no further afib.  He returned to Dr Malachy Mood office 5/12 and was found to have recurrence of afib.  He was asymptomatic at that time.  He underwent cardiovesion off of AAD 5/12 which was unsuccessful.  He was started on flecainide and returned 6/12 for cardioversion.  This cardioversion was also unsuccessful. Presently, he reports that he is doing very well.  He continues to workout regularly.  He does not feel that he is limited by his afib.  He feels that his energy is preserved.  He is unaware of any triggers or precipitants for his afib.  Today, he denies symptoms of palpitations, chest pain, shortness of breath, orthopnea, PND, lower extremity edema, dizziness, presyncope, syncope, or neurologic sequela. The patient is tolerating medications without difficulties and is otherwise without complaint today.   Past Medical History  Diagnosis Date  . Atrial fibrillation     persistent afib  . Hypertension   . Dyslipidemia   . Coronary artery disease     Non-obstructive  . Mitral regurgitation     Mild  . Abnormal PFT 1. 05/18/08  2. 11/30/08    1. Showed mild airflow obstruction, mild restriction, mild diffusion defect; FEV1  2.22(64%), FVC 3.33(65%), FEVi% 67, TLC 5.19(69%), DLCO 77%, +BD  2. FEV1 2.38(73%), FVC 3.81(80%), FEV1% 63, TLC 5.61(80%), DLCO 79%, no BD   Past Surgical History  Procedure Date  . Cataract extraction   . Cardioversion     multiple    Current Outpatient Prescriptions  Medication Sig Dispense Refill  . amLODipine-olmesartan (AZOR) 10-40 MG per tablet Take 1 tablet by mouth daily.        . Cholecalciferol (VITAMIN D3) 1000 UNITS CAPS Take by mouth daily.        . Multiple Vitamins-Minerals (CENTRUM SILVER PO) Take by mouth daily.        . nebivolol (BYSTOLIC) 5 MG tablet Take 5 mg by mouth daily.        . pravastatin (PRAVACHOL) 40 MG tablet Take 40 mg by mouth daily.        Marland Kitchen warfarin (COUMADIN) 5 MG tablet Take 5 mg by mouth as directed.          Allergies  Allergen Reactions  . Lisinopril     coughing    History   Social History  . Marital Status: Married    Spouse Name: N/A    Number of Children: 4  . Years of Education: N/A   Occupational History  . Benefit analyst     Worked with [employer]   Social History Main Topics  . Smoking status: Never Smoker   . Smokeless  tobacco: Never Used  . Alcohol Use: No     occasionally  . Drug Use: No  . Sexually Active: Not on file   Other Topics Concern  . Not on file   Social History Narrative   Married and lives in [place].  Retired Airline pilot.    Family History  Problem Relation Age of Onset  . Diabetes Mother     DM  . Stroke Father     CVA  . Pancreatic cancer Brother   . Pancreatic cancer Other     Nephew    ROS- All systems are reviewed and negative except as per the HPI above  Physical Exam: Filed Vitals:   10/14/10 1007  BP: 166/91  Pulse: 77  Height: 6\' 2"  (1.88 m)  Weight: 258 lb (117.028 kg)    GEN- The patient is well appearing, alert and oriented x 3 today.   Head- normocephalic, atraumatic Eyes-  Sclera clear, conjunctiva pink Ears- hearing intact Oropharynx- clear Neck-  supple, no JVP Lymph- no cervical lymphadenopathy Lungs- Clear to ausculation bilaterally, normal work of breathing Heart- irregular rate and rhythm, no murmurs, rubs or gallops, PMI not laterally displaced GI- soft, NT, ND, + BS Extremities- no clubbing, cyanosis, or edema MS- no significant deformity or atrophy Skin- no rash or lesion Psych- euthymic mood, full affect Neuro- strength and sensation are intact  EKG today reveals afib, V rate 77 bpm, LAD, nonspecific ST/T changes Echo 08/14/10- EF 66%, mild MR, LA size 48 mm  Assessment and Plan:

## 2011-04-01 DIAGNOSIS — M1710 Unilateral primary osteoarthritis, unspecified knee: Secondary | ICD-10-CM | POA: Diagnosis not present

## 2011-04-04 DIAGNOSIS — M1710 Unilateral primary osteoarthritis, unspecified knee: Secondary | ICD-10-CM | POA: Diagnosis not present

## 2011-04-08 DIAGNOSIS — Z7901 Long term (current) use of anticoagulants: Secondary | ICD-10-CM | POA: Diagnosis not present

## 2011-04-08 DIAGNOSIS — I4891 Unspecified atrial fibrillation: Secondary | ICD-10-CM | POA: Diagnosis not present

## 2011-04-14 DIAGNOSIS — Z0000 Encounter for general adult medical examination without abnormal findings: Secondary | ICD-10-CM | POA: Diagnosis not present

## 2011-04-14 DIAGNOSIS — I4891 Unspecified atrial fibrillation: Secondary | ICD-10-CM | POA: Diagnosis not present

## 2011-04-14 DIAGNOSIS — E782 Mixed hyperlipidemia: Secondary | ICD-10-CM | POA: Diagnosis not present

## 2011-04-14 DIAGNOSIS — N182 Chronic kidney disease, stage 2 (mild): Secondary | ICD-10-CM | POA: Diagnosis not present

## 2011-04-14 DIAGNOSIS — I10 Essential (primary) hypertension: Secondary | ICD-10-CM | POA: Diagnosis not present

## 2011-04-14 DIAGNOSIS — Z125 Encounter for screening for malignant neoplasm of prostate: Secondary | ICD-10-CM | POA: Diagnosis not present

## 2011-04-22 DIAGNOSIS — E782 Mixed hyperlipidemia: Secondary | ICD-10-CM | POA: Diagnosis not present

## 2011-04-22 DIAGNOSIS — I10 Essential (primary) hypertension: Secondary | ICD-10-CM | POA: Diagnosis not present

## 2011-04-22 DIAGNOSIS — M1990 Unspecified osteoarthritis, unspecified site: Secondary | ICD-10-CM | POA: Diagnosis not present

## 2011-04-22 DIAGNOSIS — I4891 Unspecified atrial fibrillation: Secondary | ICD-10-CM | POA: Diagnosis not present

## 2011-04-29 DIAGNOSIS — I4891 Unspecified atrial fibrillation: Secondary | ICD-10-CM | POA: Diagnosis not present

## 2011-05-27 DIAGNOSIS — I4891 Unspecified atrial fibrillation: Secondary | ICD-10-CM | POA: Diagnosis not present

## 2011-06-24 DIAGNOSIS — I4891 Unspecified atrial fibrillation: Secondary | ICD-10-CM | POA: Diagnosis not present

## 2011-07-01 DIAGNOSIS — J209 Acute bronchitis, unspecified: Secondary | ICD-10-CM | POA: Diagnosis not present

## 2011-07-14 DIAGNOSIS — M1710 Unilateral primary osteoarthritis, unspecified knee: Secondary | ICD-10-CM | POA: Diagnosis not present

## 2011-07-22 DIAGNOSIS — I4891 Unspecified atrial fibrillation: Secondary | ICD-10-CM | POA: Diagnosis not present

## 2011-07-23 DIAGNOSIS — I4891 Unspecified atrial fibrillation: Secondary | ICD-10-CM | POA: Diagnosis not present

## 2011-07-23 DIAGNOSIS — I10 Essential (primary) hypertension: Secondary | ICD-10-CM | POA: Diagnosis not present

## 2011-07-23 DIAGNOSIS — Z7901 Long term (current) use of anticoagulants: Secondary | ICD-10-CM | POA: Diagnosis not present

## 2011-08-12 DIAGNOSIS — F411 Generalized anxiety disorder: Secondary | ICD-10-CM | POA: Diagnosis not present

## 2011-08-12 DIAGNOSIS — R61 Generalized hyperhidrosis: Secondary | ICD-10-CM | POA: Diagnosis not present

## 2011-08-19 DIAGNOSIS — I4891 Unspecified atrial fibrillation: Secondary | ICD-10-CM | POA: Diagnosis not present

## 2011-09-16 DIAGNOSIS — I4891 Unspecified atrial fibrillation: Secondary | ICD-10-CM | POA: Diagnosis not present

## 2011-09-22 DIAGNOSIS — H40019 Open angle with borderline findings, low risk, unspecified eye: Secondary | ICD-10-CM | POA: Diagnosis not present

## 2011-09-22 DIAGNOSIS — Z961 Presence of intraocular lens: Secondary | ICD-10-CM | POA: Diagnosis not present

## 2011-09-30 DIAGNOSIS — G20 Parkinson's disease: Secondary | ICD-10-CM | POA: Diagnosis not present

## 2011-09-30 DIAGNOSIS — R209 Unspecified disturbances of skin sensation: Secondary | ICD-10-CM | POA: Diagnosis not present

## 2011-10-01 ENCOUNTER — Other Ambulatory Visit: Payer: Self-pay | Admitting: Neurology

## 2011-10-01 DIAGNOSIS — G20 Parkinson's disease: Secondary | ICD-10-CM

## 2011-10-01 DIAGNOSIS — R202 Paresthesia of skin: Secondary | ICD-10-CM

## 2011-10-06 DIAGNOSIS — R209 Unspecified disturbances of skin sensation: Secondary | ICD-10-CM | POA: Diagnosis not present

## 2011-10-06 DIAGNOSIS — G5600 Carpal tunnel syndrome, unspecified upper limb: Secondary | ICD-10-CM | POA: Diagnosis not present

## 2011-10-07 ENCOUNTER — Ambulatory Visit
Admission: RE | Admit: 2011-10-07 | Discharge: 2011-10-07 | Disposition: A | Discharge status: Home / Self Care | Payer: Medicare Other | Source: Ambulatory Visit | Attending: Neurology | Admitting: Neurology

## 2011-10-07 DIAGNOSIS — R202 Paresthesia of skin: Secondary | ICD-10-CM

## 2011-10-07 DIAGNOSIS — G20 Parkinson's disease: Secondary | ICD-10-CM | POA: Diagnosis not present

## 2011-10-07 DIAGNOSIS — R209 Unspecified disturbances of skin sensation: Secondary | ICD-10-CM | POA: Diagnosis not present

## 2011-10-07 MED ORDER — GADOBENATE DIMEGLUMINE 529 MG/ML IV SOLN
20.0000 mL | Freq: Once | INTRAVENOUS | Status: AC | PRN
  Administered 2011-10-07: 20 mL via INTRAVENOUS

## 2011-10-14 DIAGNOSIS — I4891 Unspecified atrial fibrillation: Secondary | ICD-10-CM | POA: Diagnosis not present

## 2011-10-14 DIAGNOSIS — I10 Essential (primary) hypertension: Secondary | ICD-10-CM | POA: Diagnosis not present

## 2011-10-16 ENCOUNTER — Other Ambulatory Visit: Payer: Self-pay | Admitting: Neurology

## 2011-10-16 DIAGNOSIS — G20 Parkinson's disease: Secondary | ICD-10-CM

## 2011-10-16 DIAGNOSIS — R9089 Other abnormal findings on diagnostic imaging of central nervous system: Secondary | ICD-10-CM

## 2011-10-22 DIAGNOSIS — I4891 Unspecified atrial fibrillation: Secondary | ICD-10-CM | POA: Diagnosis not present

## 2011-10-22 DIAGNOSIS — I10 Essential (primary) hypertension: Secondary | ICD-10-CM | POA: Diagnosis not present

## 2011-10-22 DIAGNOSIS — J309 Allergic rhinitis, unspecified: Secondary | ICD-10-CM | POA: Diagnosis not present

## 2011-10-22 DIAGNOSIS — E782 Mixed hyperlipidemia: Secondary | ICD-10-CM | POA: Diagnosis not present

## 2011-10-22 DIAGNOSIS — Z0000 Encounter for general adult medical examination without abnormal findings: Secondary | ICD-10-CM | POA: Diagnosis not present

## 2011-10-23 ENCOUNTER — Ambulatory Visit
Admission: RE | Admit: 2011-10-23 | Discharge: 2011-10-23 | Disposition: A | Discharge status: Home / Self Care | Payer: Medicare Other | Source: Ambulatory Visit | Attending: Neurology | Admitting: Neurology

## 2011-10-23 DIAGNOSIS — G20 Parkinson's disease: Secondary | ICD-10-CM | POA: Diagnosis not present

## 2011-10-23 DIAGNOSIS — I10 Essential (primary) hypertension: Secondary | ICD-10-CM

## 2011-10-23 DIAGNOSIS — J984 Other disorders of lung: Secondary | ICD-10-CM | POA: Diagnosis not present

## 2011-10-23 DIAGNOSIS — E785 Hyperlipidemia, unspecified: Secondary | ICD-10-CM

## 2011-10-23 DIAGNOSIS — R0609 Other forms of dyspnea: Secondary | ICD-10-CM

## 2011-10-23 DIAGNOSIS — R930 Abnormal findings on diagnostic imaging of skull and head, not elsewhere classified: Secondary | ICD-10-CM | POA: Diagnosis not present

## 2011-10-23 DIAGNOSIS — R9089 Other abnormal findings on diagnostic imaging of central nervous system: Secondary | ICD-10-CM

## 2011-10-23 DIAGNOSIS — I4891 Unspecified atrial fibrillation: Secondary | ICD-10-CM | POA: Diagnosis not present

## 2011-10-23 DIAGNOSIS — G20A1 Parkinson's disease without dyskinesia, without mention of fluctuations: Secondary | ICD-10-CM | POA: Diagnosis not present

## 2011-10-23 DIAGNOSIS — R942 Abnormal results of pulmonary function studies: Secondary | ICD-10-CM

## 2011-10-23 DIAGNOSIS — R0989 Other specified symptoms and signs involving the circulatory and respiratory systems: Secondary | ICD-10-CM | POA: Diagnosis not present

## 2011-10-23 LAB — CSF CELL COUNT WITH DIFFERENTIAL
RBC Count, CSF: 0 cu mm
WBC, CSF: 0 cu mm (ref 0–5)

## 2011-10-23 NOTE — Progress Notes (Signed)
Pt tolerated blood draw well, blood drawn from left antecubital vein, site unremarkable.

## 2011-10-25 LAB — ANGIOTENSIN CONVERTING ENZYME, CSF: ACE, CSF: 5 U/L (ref <=15)

## 2011-10-25 LAB — CNS IGG SYNTHESIS RATE, CSF+BLOOD
IgG, CSF: 3.3 mg/dL (ref 0.8–7.7)
MS CNS IgG Synthesis Rate: -6.5 mg/24 h (ref <=3.3)

## 2011-11-04 DIAGNOSIS — G20 Parkinson's disease: Secondary | ICD-10-CM | POA: Diagnosis not present

## 2011-11-11 DIAGNOSIS — I4891 Unspecified atrial fibrillation: Secondary | ICD-10-CM | POA: Diagnosis not present

## 2011-12-09 DIAGNOSIS — I4891 Unspecified atrial fibrillation: Secondary | ICD-10-CM | POA: Diagnosis not present

## 2011-12-16 DIAGNOSIS — G20 Parkinson's disease: Secondary | ICD-10-CM | POA: Diagnosis not present

## 2011-12-25 DIAGNOSIS — K5730 Diverticulosis of large intestine without perforation or abscess without bleeding: Secondary | ICD-10-CM | POA: Diagnosis not present

## 2011-12-25 DIAGNOSIS — D126 Benign neoplasm of colon, unspecified: Secondary | ICD-10-CM | POA: Diagnosis not present

## 2011-12-25 DIAGNOSIS — Z1211 Encounter for screening for malignant neoplasm of colon: Secondary | ICD-10-CM | POA: Diagnosis not present

## 2011-12-30 DIAGNOSIS — M1710 Unilateral primary osteoarthritis, unspecified knee: Secondary | ICD-10-CM | POA: Diagnosis not present

## 2012-01-06 DIAGNOSIS — I4891 Unspecified atrial fibrillation: Secondary | ICD-10-CM | POA: Diagnosis not present

## 2012-01-12 DIAGNOSIS — M1710 Unilateral primary osteoarthritis, unspecified knee: Secondary | ICD-10-CM | POA: Diagnosis not present

## 2012-01-19 DIAGNOSIS — M1710 Unilateral primary osteoarthritis, unspecified knee: Secondary | ICD-10-CM | POA: Diagnosis not present

## 2012-01-26 DIAGNOSIS — J40 Bronchitis, not specified as acute or chronic: Secondary | ICD-10-CM | POA: Diagnosis not present

## 2012-01-26 DIAGNOSIS — M1710 Unilateral primary osteoarthritis, unspecified knee: Secondary | ICD-10-CM | POA: Diagnosis not present

## 2012-01-26 DIAGNOSIS — Z23 Encounter for immunization: Secondary | ICD-10-CM | POA: Diagnosis not present

## 2012-02-03 DIAGNOSIS — I4891 Unspecified atrial fibrillation: Secondary | ICD-10-CM | POA: Diagnosis not present

## 2012-02-03 DIAGNOSIS — I10 Essential (primary) hypertension: Secondary | ICD-10-CM | POA: Diagnosis not present

## 2012-02-03 DIAGNOSIS — Z7901 Long term (current) use of anticoagulants: Secondary | ICD-10-CM | POA: Diagnosis not present

## 2012-02-16 DIAGNOSIS — I4891 Unspecified atrial fibrillation: Secondary | ICD-10-CM | POA: Diagnosis not present

## 2012-02-17 DIAGNOSIS — R209 Unspecified disturbances of skin sensation: Secondary | ICD-10-CM | POA: Diagnosis not present

## 2012-02-17 DIAGNOSIS — R9409 Abnormal results of other function studies of central nervous system: Secondary | ICD-10-CM | POA: Diagnosis not present

## 2012-02-17 DIAGNOSIS — I4891 Unspecified atrial fibrillation: Secondary | ICD-10-CM | POA: Diagnosis not present

## 2012-02-17 DIAGNOSIS — G20 Parkinson's disease: Secondary | ICD-10-CM | POA: Diagnosis not present

## 2012-03-01 DIAGNOSIS — I4891 Unspecified atrial fibrillation: Secondary | ICD-10-CM | POA: Diagnosis not present

## 2012-03-15 DIAGNOSIS — I10 Essential (primary) hypertension: Secondary | ICD-10-CM | POA: Diagnosis not present

## 2012-03-15 DIAGNOSIS — I4891 Unspecified atrial fibrillation: Secondary | ICD-10-CM | POA: Diagnosis not present

## 2012-03-15 DIAGNOSIS — Z7901 Long term (current) use of anticoagulants: Secondary | ICD-10-CM | POA: Diagnosis not present

## 2012-03-15 DIAGNOSIS — R609 Edema, unspecified: Secondary | ICD-10-CM | POA: Diagnosis not present

## 2012-03-23 DIAGNOSIS — Z79899 Other long term (current) drug therapy: Secondary | ICD-10-CM | POA: Diagnosis not present

## 2012-03-23 DIAGNOSIS — R609 Edema, unspecified: Secondary | ICD-10-CM | POA: Diagnosis not present

## 2012-03-23 DIAGNOSIS — I4891 Unspecified atrial fibrillation: Secondary | ICD-10-CM | POA: Diagnosis not present

## 2012-03-23 DIAGNOSIS — I10 Essential (primary) hypertension: Secondary | ICD-10-CM | POA: Diagnosis not present

## 2012-04-09 DIAGNOSIS — Z7901 Long term (current) use of anticoagulants: Secondary | ICD-10-CM | POA: Diagnosis not present

## 2012-04-09 DIAGNOSIS — I10 Essential (primary) hypertension: Secondary | ICD-10-CM | POA: Diagnosis not present

## 2012-04-09 DIAGNOSIS — I4891 Unspecified atrial fibrillation: Secondary | ICD-10-CM | POA: Diagnosis not present

## 2012-04-09 DIAGNOSIS — R609 Edema, unspecified: Secondary | ICD-10-CM | POA: Diagnosis not present

## 2012-04-12 DIAGNOSIS — I4891 Unspecified atrial fibrillation: Secondary | ICD-10-CM | POA: Diagnosis not present

## 2012-04-12 DIAGNOSIS — I10 Essential (primary) hypertension: Secondary | ICD-10-CM | POA: Diagnosis not present

## 2012-04-19 DIAGNOSIS — I4891 Unspecified atrial fibrillation: Secondary | ICD-10-CM | POA: Diagnosis not present

## 2012-04-22 ENCOUNTER — Other Ambulatory Visit: Payer: Self-pay | Admitting: Internal Medicine

## 2012-04-22 DIAGNOSIS — R609 Edema, unspecified: Secondary | ICD-10-CM | POA: Diagnosis not present

## 2012-04-22 DIAGNOSIS — R599 Enlarged lymph nodes, unspecified: Secondary | ICD-10-CM | POA: Diagnosis not present

## 2012-04-22 DIAGNOSIS — N400 Enlarged prostate without lower urinary tract symptoms: Secondary | ICD-10-CM | POA: Diagnosis not present

## 2012-04-22 DIAGNOSIS — Z0000 Encounter for general adult medical examination without abnormal findings: Secondary | ICD-10-CM | POA: Diagnosis not present

## 2012-04-22 DIAGNOSIS — E782 Mixed hyperlipidemia: Secondary | ICD-10-CM | POA: Diagnosis not present

## 2012-04-22 DIAGNOSIS — Z1331 Encounter for screening for depression: Secondary | ICD-10-CM | POA: Diagnosis not present

## 2012-04-22 DIAGNOSIS — I10 Essential (primary) hypertension: Secondary | ICD-10-CM | POA: Diagnosis not present

## 2012-04-22 DIAGNOSIS — I4891 Unspecified atrial fibrillation: Secondary | ICD-10-CM | POA: Diagnosis not present

## 2012-04-26 ENCOUNTER — Ambulatory Visit
Admission: RE | Admit: 2012-04-26 | Discharge: 2012-04-26 | Disposition: A | Discharge status: Home / Self Care | Payer: Medicare Other | Source: Ambulatory Visit | Attending: Internal Medicine | Admitting: Internal Medicine

## 2012-04-26 DIAGNOSIS — R599 Enlarged lymph nodes, unspecified: Secondary | ICD-10-CM

## 2012-04-26 DIAGNOSIS — K8689 Other specified diseases of pancreas: Secondary | ICD-10-CM | POA: Diagnosis not present

## 2012-04-26 MED ORDER — IOHEXOL 300 MG/ML  SOLN
125.0000 mL | Freq: Once | INTRAMUSCULAR | Status: AC | PRN
  Administered 2012-04-26: 125 mL via INTRAVENOUS

## 2012-05-11 DIAGNOSIS — Z7901 Long term (current) use of anticoagulants: Secondary | ICD-10-CM | POA: Diagnosis not present

## 2012-05-11 DIAGNOSIS — I4891 Unspecified atrial fibrillation: Secondary | ICD-10-CM | POA: Diagnosis not present

## 2012-05-11 DIAGNOSIS — R609 Edema, unspecified: Secondary | ICD-10-CM | POA: Diagnosis not present

## 2012-05-11 DIAGNOSIS — I10 Essential (primary) hypertension: Secondary | ICD-10-CM | POA: Diagnosis not present

## 2012-06-21 DIAGNOSIS — I4891 Unspecified atrial fibrillation: Secondary | ICD-10-CM | POA: Diagnosis not present

## 2012-06-22 ENCOUNTER — Encounter: Payer: Self-pay | Admitting: Neurology

## 2012-06-22 ENCOUNTER — Ambulatory Visit (INDEPENDENT_AMBULATORY_CARE_PROVIDER_SITE_OTHER): Payer: Medicare Other | Admitting: Neurology

## 2012-06-22 VITALS — BP 147/86 | HR 71 | Temp 98.4°F | Ht 73.0 in | Wt 259.0 lb

## 2012-06-22 DIAGNOSIS — R202 Paresthesia of skin: Secondary | ICD-10-CM

## 2012-06-22 DIAGNOSIS — I4891 Unspecified atrial fibrillation: Secondary | ICD-10-CM

## 2012-06-22 DIAGNOSIS — R209 Unspecified disturbances of skin sensation: Secondary | ICD-10-CM

## 2012-06-22 DIAGNOSIS — G20 Parkinson's disease: Secondary | ICD-10-CM

## 2012-06-22 DIAGNOSIS — G20C Parkinsonism, unspecified: Secondary | ICD-10-CM

## 2012-06-22 HISTORY — DX: Parkinsonism, unspecified: G20.C

## 2012-06-22 HISTORY — DX: Parkinson's disease: G20

## 2012-06-22 HISTORY — DX: Paresthesia of skin: R20.2

## 2012-06-22 MED ORDER — CARBIDOPA-LEVODOPA 25-100 MG PO TABS: 0.5000 | ORAL_TABLET | Freq: Three times a day (TID) | ORAL | Status: DC

## 2012-06-22 NOTE — Patient Instructions (Addendum)
I think overall you are doing fairly well and are stable at this point. But I do want you to restart the Gabapentin seen 100 mg capsule one pill 3 times a day for your tingling. After a month I want to just start taking a new medicine for your Parkinson's symptoms. This will be generic Sinemet 25/100 mg strength half a pill 3 times a day, namely at 8 AM, 2 PM, and 8 PM. Give [place] a call if they have any trouble with the new medication or the gabapentin. I do have some generic suggestions for you today:  Please make sure that you drink plenty of fluids. I would like for you to exercise daily for example in the form of walking 20-30 minutes every day, if you can. Please keep a regular sleep-wake schedule, keep regular meal times, do not skip any meals, eat  healthy snacks in between meals, such as fruit or nuts. Try to eat protein with every meal.   Engage in social activities in your community and with your family and try to keep up with current events by reading the newspaper or watching the news.  I want to see you back in 3 months, sooner if we need to. Please call us if you have any interim questions, concerns, or problems or updates to need to discuss.  Brett Canales is my clinical assistant and will answer any of your questions and relay your messages to me and will give you my messages.   Our phone number is [phone]. We also have an after hours call service for urgent matters and there is a physician on-call for urgent questions. For any emergencies you know to call 911 or go to the nearest emergency room.

## 2012-06-22 NOTE — Progress Notes (Signed)
Subjective:    Patient ID: Brian [name] is a 72 y.o. male.  HPI Interim history:  Brian [name] is a very friendly 72 year old right-handed African American gentleman with an underlying history of atrial fibrillation, hypertension, hyperlipidemia and obstructive sleep apnea who presents for followup consultation of his parkinsonism. He is unaccompanied today. This is his first visit with me and he has previously been following with Dr. Sandria Manly for a few years. He was last seen by him on 02/17/2012 at which time Dr. Sandria Manly felt that his parkinsonism had improved. Particularly his gait was better. The patient was complaining of some paresthesias for which Dr. Sandria Manly suggested a trial of low-dose gabapentin 100 mg 3 times a day. The patient currently is on pramipexole 0.5 mg 3 times a day, Azor 10-40 mg strength once daily, multivitamin once daily, vitamin D, pravastatin 40 mg once daily, by starlike 5 mg once daily, Coumadin 5 mg daily, gabapentin seen 100 mg 3 times a day.  I reviewed Dr. Imagene Gurney prior notes and the patient records and below is a summary of that review:  72 year old RH AAM with a Hx of A fib of over 20 years' duration, s/p cardioversion, which was not successful. He is on Coumadin. Some 2 1/2 years ago he noted tingling occurring in various regions, including his legs, arms, face, and hands. This would occur at different times of the day without triggers and no alleviating factors. He has been sleeping well. He has a family history of DM on his mother's side. He uses city water. He has no history of exposure to toxins. He denies Lhermitte's sign, single eye visual loss, double vision, swallowing problems, bowel or bladder dysfunction or slurred speech. He works out at [employer] 5 times per week. He has a past history of gastroesophageal reflux disease. CBC, CMP, lipid profile, TSH, and PSA were normal on 04/04/2010. EKG showed atrial fibrillation with left axis anterior fascicular block. His vitamin B12  level was normal. He was noted to have parkinsonian findings on 08/24/2010. His tingling did not improve and reportedly are made worse during inactivity and better with activity. They occur throughout the day. There is no itching and no pain. He has nocturia 2 times per night. He has "arthritis" in his knees followed by Dr. Jillyn Hidden and received an injection in each knee recently with benefit. He has difficulty getting out of a car. He believes his knee pain is what causes walking difficulties.He notices tightness in his legs. He had an evaluation for sleep apnea 2 years ago with apparently negative findings. He has constipation, but no loss of sense of smell. He denies memory loss, hallucinations, or delusions. He noted that his handwriting was getting smaller. MRI of the brain on 10/07/2011 showed mild to moderate chronic small vessel disease. EMG/NCV on 10/06/2011 was normal. CSF evaluation was normal including  protein, glucose, cell count, VDRL, ACE, IgG, and oligoclonal IgG on 10/23/2011. There is no family history of tremor. 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 His wife has noted tremor in his right hand and arm when he is anxious. He was placed him on increasing doses of pramipaxole, which helped. He denies sleepiness, postural hypotension, compulsive behavior, or change in appetite with the medication. He recently had a cough with mucus and was treated with prednisone. He snores at night.   He states, that discontinued both the gabapentin and the pramipexole a couple of months ago due to swelling in the feet and ankles,  which improved after stopping the meds. He has arthritis in both knees, L is worse. He feels, his Sx with parkinsonism are worse on the R.   His Past Medical History Is Significant For: Past Medical History  Diagnosis Date  . Atrial fibrillation     persistent afib  . Hypertension   . Dyslipidemia   . Coronary artery disease     Non-obstructive  . Mitral regurgitation      Mild  . Abnormal PFT 1. 05/18/08  2. 11/30/08    1. Showed mild airflow obstruction, mild restriction, mild diffusion defect; FEV1 2.22(64%), FVC 3.33(65%), FEVi% 67, TLC 5.19(69%), DLCO 77%, +BD  2. FEV1 2.38(73%), FVC 3.81(80%), FEV1% 63, TLC 5.61(80%), DLCO 79%, no BD  . Parkinsonism 06/22/2012  . Paresthesias 06/22/2012    His Past Surgical History Is Significant For: Past Surgical History  Procedure Laterality Date  . Cataract extraction    . Cardioversion      multiple    His Family History Is Significant For: Family History  Problem Relation Age of Onset  . Diabetes Mother     DM  . Stroke Father     CVA  . Pancreatic cancer Brother   . Pancreatic cancer Other     Nephew    His Social History Is Significant For: History   Social History  . Marital Status: Married    Spouse Name: N/A    Number of Children: 4  . Years of Education: N/A   Occupational History  . Benefit analyst     Worked with [employer]   Social History Main Topics  . Smoking status: Never Smoker   . Smokeless tobacco: Never Used  . Alcohol Use: No     Comment: occasionally  . Drug Use: No  . Sexually Active: None   Other Topics Concern  . None   Social History Narrative   Married and lives in [place].  Retired Airline pilot.    His Allergies Are:  Allergies  Allergen Reactions  . Lisinopril Other (See Comments)    coughing  :   His Current Medications Are:  Outpatient Encounter Prescriptions as of 06/22/2012  Medication Sig Dispense Refill  . amLODipine-olmesartan (AZOR) 10-40 MG per tablet Take 1 tablet by mouth daily.        . Cholecalciferol (VITAMIN D3) 1000 UNITS CAPS Take by mouth daily.        Marland Kitchen doxazosin (CARDURA) 2 MG tablet       . furosemide (LASIX) 20 MG tablet       . Multiple Vitamins-Minerals (CENTRUM SILVER PO) Take by mouth daily.        . nebivolol (BYSTOLIC) 5 MG tablet Take 5 mg by mouth daily.        . pravastatin (PRAVACHOL) 40 MG tablet Take 40 mg by mouth  daily.        Marland Kitchen warfarin (COUMADIN) 5 MG tablet Take 5 mg by mouth as directed.        . carbidopa-levodopa (SINEMET IR) 25-100 MG per tablet Take 0.5 tablets by mouth 3 (three) times daily. At 8AM, Patricia Pesa, and 8PM. Start on 07/22/12  45 tablet  3   No facility-administered encounter medications on file as of 06/22/2012.  :  Review of Systems  HENT: Positive for tinnitus.   Respiratory:       Snoring  Neurological: Positive for numbness.    Objective:  Neurologic Exam  Physical Exam Physical Examination:   Filed Vitals:   06/22/12  1054  BP: 147/86  Pulse: 71  Temp: 98.4 F (36.9 C)    General Examination: The patient is a very pleasant 72 y.o. male in no acute distress.  HEENT: Normocephalic, atraumatic, pupils are equal, round and reactive to light and accommodation. Extraocular tracking shows mild saccadic breakdown without nystagmus noted. There is no limitation to gaze. There is no apraxia of eyelid opening. There is mild decrease in eye blink rate. Hearing is intact. Face is symmetric with moderate facial masking and normal facial sensation. There is no lip, neck or jaw tremor. Neck is mildly rigid with intact passive ROM. There are no carotid bruits on auscultation. Oropharynx exam reveals no mouth dryness. No significant airway crowding is noted. Mallampati is class II. Tongue protrudes centrally and palate elevates symmetrically.    Chest: is clear to auscultation without wheezing, rhonchi or crackles noted.  Heart: sounds are irregularly irregular without murmurs, rubs or gallops noted.   Abdomen: is soft, non-tender and non-distended with normal bowel sounds appreciated on auscultation.  Extremities: There is no trace edema in the ankles bilaterally.   Skin: is warm and dry with no trophic changes noted.  Musculoskeletal: exam reveals no obvious joint deformities, tenderness or joint swelling or erythema.  Neurologically:  Mental status: The patient is awake and alert,  paying good  attention. He is able to to provide a good history. He is oriented to: person, place, time/date, situation, day of week, month of year and year. His memory, attention, language and knowledge are intact. There is no aphasia, agnosia, apraxia or anomia. There is a mild degree of bradyphrenia. Speech is mildly hypophonic with no dysarthria noted. Mood is congruent and affect is normal.  Cranial nerves are as described above under HEENT exam. In addition, shoulder shrug is normal with equal shoulder height noted.  Motor exam: Normal bulk, and strength for age is noted. Tone is mildly rigid with absence of cogwheeling in the bilateral extremities. There is overall mild bradykinesia. There is no drift or rebound. Romberg is negative. Reflexes are 1+ in the upper extremities and 1+ in the lower extremities. Fine motor skills exam reveals: Finger taps are mildly impaired on the right and mildly impaired on the left. Hand movements are mildly impaired on the right and mildly impaired on the left. RAP (rapid alternating patting) is mildly impaired on the right and mildly impaired on the left. Foot taps are mildly impaired on the right and mildly impaired on the left. Foot agility (in the form of heel stomping) is mildly impaired on the right and mildly impaired on the left.    Cerebellar testing shows no dysmetria or intention tremor on finger to nose testing. Heel to shin is unremarkable bilaterally. There is no truncal or gait ataxia.   Sensory exam is intact to light touch, pinprick, vibration, temperature sense and proprioception in the upper and lower extremities.   Gait, station and balance exan: He stands up from the seated position with no significant difficulty. No veering to one side is noted. He is not noted to lean to one side. Posture is mildly  to moderately stooped. Stance is narrow-based. He walks with decrease in stride length and pace and decreased arm swing on both sides . He turns in  3 steps. Tandem walk is not possible. Balance is mildly impaired. On shoulder pull test, there is no corrective steps. He is not able to do a toe or heel stance.     Assessment and Plan:  Assessment and Plan:  In summary, Brian [name] is a very pleasant 72 y.o.-year old male with a history of  atrial fibrillation, hypertension, hyperlipidemia, and signs and symptoms of mild parkinsonism without much in the way of lateralization. He is doing  fairly well at this time.  I had a long chat with the patient about my findings and the diagnosis, its prognosis and treatment options.  It may be that he does not have idiopathic Parkinson's disease. Time will tell and perhaps he may evolve into one of the Parkinson's plus syndromes. We talked about medical treatments and non-pharmacological approaches. We talked about maintaining a healthy lifestyle in general. I encouraged the patient to eat healthy, exercise daily and keep well hydrated, to keep a scheduled bedtime and wake time routine, to not skip any meals and eat healthy snacks in between meals and to have protein with every meal.   I recommended the following at this time: I do want to restart the Gabapentin seen 100 mg capsule one pill 3 times a day for his tingling. After a month I want him to start a trial of generic Sinemet 25/100 mg strength half a pill 3 times a day, namely at 8 AM, 2 PM, and 8 PM.  I answered all his questions today and the patient was in agreement with the plan. I would like to see him back in 3 months, sooner if the need arises and encouraged him to call with any interim questions, concerns, problems or updates and refill requests.

## 2012-07-08 ENCOUNTER — Telehealth: Payer: Self-pay

## 2012-07-08 ENCOUNTER — Other Ambulatory Visit: Payer: Self-pay | Admitting: *Deleted

## 2012-07-08 DIAGNOSIS — G20 Parkinson's disease: Secondary | ICD-10-CM

## 2012-07-08 MED ORDER — CARBIDOPA-LEVODOPA 25-100 MG PO TABS: 0.5000 | ORAL_TABLET | Freq: Three times a day (TID) | ORAL | Status: DC

## 2012-07-08 NOTE — Telephone Encounter (Signed)
Patient called clinic saying pharmacy says they did not get the Rx Dr Frances Furbish sent.  I will resend it now.  Brett Canales has spoken to the patient.

## 2012-07-13 ENCOUNTER — Telehealth: Payer: Self-pay | Admitting: *Deleted

## 2012-07-13 NOTE — Telephone Encounter (Signed)
Message copied by Monico Blitz on Tue Jul 13, 2012  5:22 PM ------      Message from: [facility], [place] L      Created: Tue Jul 13, 2012 12:42 PM       Pt got meds from Dr. Frances Furbish and was told to start on May 1. He wants to know if he can start it earlier.  Not sure what the meds is. Please call [phone] cell.            Thanks, ------

## 2012-07-14 NOTE — Telephone Encounter (Signed)
The medication is carbidopa-levodopa 25/100 mg strength half a pill 3 times a day, namely at 8 AM, 2 PM, and 8 PM. He can go ahead and start it, pls relay to patient. Thanks Huston Foley, MD, PhD [facility])

## 2012-07-19 DIAGNOSIS — Z7901 Long term (current) use of anticoagulants: Secondary | ICD-10-CM | POA: Diagnosis not present

## 2012-07-19 DIAGNOSIS — I4891 Unspecified atrial fibrillation: Secondary | ICD-10-CM | POA: Diagnosis not present

## 2012-07-22 ENCOUNTER — Telehealth: Payer: Self-pay | Admitting: *Deleted

## 2012-07-22 NOTE — Telephone Encounter (Signed)
Patient called wanting to know if he can come in and be seen sooner. He is scheduled to see Dr. Frances Furbish 09-21-12 and was last seen 06-22-12. The patient is stating that his new meds are not working. Please advise.

## 2012-07-22 NOTE — Telephone Encounter (Signed)
Message copied by Ardeth Sportsman on Thu Jul 22, 2012  9:24 AM ------      Message from: [facility], MON[place] L      Created: Wed Jul 21, 2012  2:30 PM       Pt wants to know if he can be seen sooner, he has an appt in July.  The new medication does not seem to be working.  He wants to try something else.  Please call and advise. ------

## 2012-07-22 NOTE — Telephone Encounter (Signed)
When I first saw the patient on 06/22/2012 a started him on gabapentin 100 mg 3 times a day and instructed him to start on Sinemet half a pill 3 times a day on 07/22/2012. He had called back on 4/22 and wanted to start the Sinemet sooner. Asked him to please if he is on Sinemet and gabapentin at this time and which medication he believes is now working for him. We may have to adjust one or both of these medications and we can do this over the phone for now.

## 2012-07-23 NOTE — Telephone Encounter (Signed)
Please see previous note from today

## 2012-07-23 NOTE — Telephone Encounter (Signed)
The patient stated that the Sinemet is not working. He also stated that he is not sure why he was put on it and still having tremors. He would like a call back because he had hoped for some relief by now.

## 2012-07-23 NOTE — Telephone Encounter (Signed)
Please advise patient that the Sinemet is for the same reason he was placed on pramipexole by Dr. love, namely to improve the tremors. If he is taking half a pill 3 times a day I would recommend that he try one whole pill 3 times a day.

## 2012-07-23 NOTE — Telephone Encounter (Signed)
Brian [name], can you please explain previous message to the patient.

## 2012-07-27 NOTE — Telephone Encounter (Signed)
I called pt and relayed the message of taking the 1 tablet of 25/100 po tid.  He had cut all the tabs in half (then will take 2 (half tabs) po tid.  He stated the gabapentin is having some positive effect.  I encouraged him to try the sinemet increase for 2 wks and then if still problems to call us back for sooner appt.    He verbalized understanding.

## 2012-07-30 DIAGNOSIS — Z79899 Other long term (current) drug therapy: Secondary | ICD-10-CM | POA: Diagnosis not present

## 2012-07-30 DIAGNOSIS — I4891 Unspecified atrial fibrillation: Secondary | ICD-10-CM | POA: Diagnosis not present

## 2012-07-30 DIAGNOSIS — I10 Essential (primary) hypertension: Secondary | ICD-10-CM | POA: Diagnosis not present

## 2012-07-30 DIAGNOSIS — Z7901 Long term (current) use of anticoagulants: Secondary | ICD-10-CM | POA: Diagnosis not present

## 2012-07-30 DIAGNOSIS — R609 Edema, unspecified: Secondary | ICD-10-CM | POA: Diagnosis not present

## 2012-08-06 DIAGNOSIS — Z79899 Other long term (current) drug therapy: Secondary | ICD-10-CM | POA: Diagnosis not present

## 2012-08-19 ENCOUNTER — Telehealth: Payer: Self-pay | Admitting: Neurology

## 2012-08-19 NOTE — Telephone Encounter (Signed)
Patient has been scheduled for 08-23-12 at 10:30.

## 2012-08-20 DIAGNOSIS — H6120 Impacted cerumen, unspecified ear: Secondary | ICD-10-CM | POA: Diagnosis not present

## 2012-08-23 ENCOUNTER — Ambulatory Visit (INDEPENDENT_AMBULATORY_CARE_PROVIDER_SITE_OTHER): Payer: Medicare Other | Admitting: Neurology

## 2012-08-23 ENCOUNTER — Encounter: Payer: Self-pay | Admitting: Neurology

## 2012-08-23 VITALS — BP 141/76 | HR 66 | Temp 97.8°F | Ht 73.0 in | Wt 260.0 lb

## 2012-08-23 DIAGNOSIS — I4891 Unspecified atrial fibrillation: Secondary | ICD-10-CM | POA: Diagnosis not present

## 2012-08-23 DIAGNOSIS — Z7901 Long term (current) use of anticoagulants: Secondary | ICD-10-CM | POA: Diagnosis not present

## 2012-08-23 DIAGNOSIS — G20 Parkinson's disease: Secondary | ICD-10-CM

## 2012-08-23 MED ORDER — CARBIDOPA-LEVODOPA 25-100 MG PO TABS: 1.0000 | ORAL_TABLET | Freq: Four times a day (QID) | ORAL | Status: DC

## 2012-08-23 NOTE — Patient Instructions (Signed)
Your exam is stable. Increase Sinemet to 4 pills a day: at 7 am, 11 am, 3 pm, 7 pm. Keep gabapentin at three times a day, follow up in 3 months.

## 2012-08-23 NOTE — Addendum Note (Signed)
Addended by: Huston Foley on: 08/23/2012 11:55 AM   Modules accepted: Orders

## 2012-08-23 NOTE — Progress Notes (Signed)
Subjective:    Patient ID: Brian [name] is a 72 y.o. male.  HPI Interim history:   [name] is a 72 year old right-handed gentleman who presents for followup consultation of his parkinsonism. I first met him on 06/22/2012 and he presents for a sooner than scheduled appointment because he felt his medication was not helpful. He previously followed with Dr. Sandria Manly for parkinsonism. He has an underlying medical history of atrial fibrillation, hypertension, hyperlipidemia and OSA. He has been complaining of paresthesias and Dr. Sandria Manly started him on low-dose gabapentin. For his parkinsonism which was first noted in June of 2012 he was tried on pramipexole which helped but at his first visit with me he told me that his gabapentin was not helpful and he also discontinued pramipexole a few months ago because of swelling in his feet and ankles. This improved after stopping both medications. At the time of his visit with me I felt he had mild parkinsonism without much in the way of lateralization. I started him on gabapentin again because of his paresthesias and also asked him to start low-dose Sinemet. He called back stating that the Sinemet was not helpful. He requested a sooner appointment. He is taking gabapentin 100 mg tid, and C/L one full pill tid with minimal improvement he states today. He takes all his pills 7 AM, 1 PM, 7 PM. No SE with C/L reported.    His Past Medical History Is Significant For: Past Medical History  Diagnosis Date  . Atrial fibrillation     persistent afib  . Hypertension   . Dyslipidemia   . Coronary artery disease     Non-obstructive  . Mitral regurgitation     Mild  . Abnormal PFT 1. 05/18/08  2. 11/30/08    1. Showed mild airflow obstruction, mild restriction, mild diffusion defect; FEV1 2.22(64%), FVC 3.33(65%), FEVi% 67, TLC 5.19(69%), DLCO 77%, +BD  2. FEV1 2.38(73%), FVC 3.81(80%), FEV1% 63, TLC 5.61(80%), DLCO 79%, no BD  . Parkinsonism 06/22/2012  . Paresthesias  06/22/2012    His Past Surgical History Is Significant For: Past Surgical History  Procedure Laterality Date  . Cataract extraction    . Cardioversion      multiple    His Family History Is Significant For: Family History  Problem Relation Age of Onset  . Diabetes Mother     DM  . Stroke Father     CVA  . Pancreatic cancer Brother   . Pancreatic cancer Other     Nephew    His Social History Is Significant For: History   Social History  . Marital Status: Married    Spouse Name: N/A    Number of Children: 4  . Years of Education: N/A   Occupational History  . Benefit analyst     Worked with [employer]   Social History Main Topics  . Smoking status: Never Smoker   . Smokeless tobacco: Never Used  . Alcohol Use: No     Comment: occasionally  . Drug Use: No  . Sexually Active: None   Other Topics Concern  . None   Social History Narrative   Married and lives in [place].  Retired Airline pilot.    His Allergies Are:  Allergies  Allergen Reactions  . Lisinopril Other (See Comments)    coughing  :   His Current Medications Are:  Outpatient Encounter Prescriptions as of 08/23/2012  Medication Sig Dispense Refill  . amLODipine-olmesartan (AZOR) 10-40 MG per tablet Take 1 tablet  by mouth daily.        . carbidopa-levodopa (SINEMET IR) 25-100 MG per tablet Take 1 tablet by mouth 3 (three) times daily. At 8AM, 2PM, and 8PM. Start on 07/22/12      . Cholecalciferol (VITAMIN D3) 1000 UNITS CAPS Take by mouth daily.        Marland Kitchen doxazosin (CARDURA) 2 MG tablet       . furosemide (LASIX) 20 MG tablet       . gabapentin (NEURONTIN) 100 MG capsule Take 100 mg by mouth 3 (three) times daily.      . Multiple Vitamins-Minerals (CENTRUM SILVER PO) Take by mouth daily.        . nebivolol (BYSTOLIC) 5 MG tablet Take 5 mg by mouth daily.        . pravastatin (PRAVACHOL) 40 MG tablet Take 40 mg by mouth daily.        Marland Kitchen warfarin (COUMADIN) 5 MG tablet Take 5 mg by mouth as directed.         . [DISCONTINUED] carbidopa-levodopa (SINEMET IR) 25-100 MG per tablet Take 0.5 tablets by mouth 3 (three) times daily. At 8AM, Patricia Pesa, and 8PM. Start on 07/22/12  45 tablet  3   No facility-administered encounter medications on file as of 08/23/2012.    Review of Systems  Neurological: Positive for tremors.    Objective:  Neurologic Exam  Physical Exam Physical Examination:   Filed Vitals:   08/23/12 1036  BP: 141/76  Pulse: 66  Temp: 97.8 F (36.6 C)    General Examination: The patient is a very pleasant 72 y.o. male in no acute distress.  HEENT: Normocephalic, atraumatic, pupils are equal, round and reactive to light and accommodation. Extraocular tracking shows mild saccadic breakdown without nystagmus noted. There is no limitation to gaze. There is no apraxia of eyelid opening. There is mild decrease in eye blink rate. Hearing is intact. Face is symmetric with moderate facial masking and normal facial sensation. There is no lip, neck or jaw tremor. Neck is mildly rigid with intact passive ROM. There are no carotid bruits on auscultation. Oropharynx exam reveals no mouth dryness. No significant airway crowding is noted. Mallampati is class II. Tongue protrudes centrally and palate elevates symmetrically.    Chest: is clear to auscultation without wheezing, rhonchi or crackles noted.  Heart: sounds are irregularly irregular without murmurs, rubs or gallops noted.   Abdomen: is soft, non-tender and non-distended with normal bowel sounds appreciated on auscultation.  Extremities: There is no trace edema in the ankles bilaterally.   Skin: is warm and dry with no trophic changes noted.  Musculoskeletal: exam reveals no obvious joint deformities, tenderness or joint swelling or erythema.  Neurologically:  Mental status: The patient is awake and alert, paying good  attention. He is able to to provide a good history. He is oriented to: person, place, time/date, situation, day of  week, month of year and year. His memory, attention, language and knowledge are intact. There is no aphasia, agnosia, apraxia or anomia. There is a mild degree of bradyphrenia. Speech is mildly hypophonic with no dysarthria noted. Mood is congruent and affect is normal.  Cranial nerves are as described above under HEENT exam. In addition, shoulder shrug is normal with equal shoulder height noted.  Motor exam: Normal bulk, and strength for age is noted. Tone is mildly rigid with absence of cogwheeling in the bilateral extremities. There is a mild intermittent R hand tremor today. There is overall mild bradykinesia.  There is no drift or rebound. Romberg is negative. Reflexes are 1+ in the upper extremities and 1+ in the lower extremities. Fine motor skills exam reveals: Finger taps are mildly impaired on the right and mildly impaired on the left. Hand movements are mildly impaired on the right and mildly impaired on the left. RAP (rapid alternating patting) is mildly impaired on the right and mildly impaired on the left. Foot taps are mildly impaired on the right and mildly impaired on the left. Foot agility (in the form of heel stomping) is mildly impaired on the right and mildly impaired on the left.    Cerebellar testing shows no dysmetria or intention tremor on finger to nose testing. Heel to shin is unremarkable bilaterally. There is no truncal or gait ataxia.   Sensory exam is intact to light touch.   Gait, station and balance exan: He stands up from the seated position with no significant difficulty. No veering to one side is noted. He is not noted to lean to one side. Posture is mildly  to moderately stooped. Stance is narrow-based. He walks with decrease in stride length and pace and decreased arm swing on both sides . He turns in 3 steps. Tandem walk is not possible. Balance is mildly impaired.   Assessment and Plan:    In summary, EULOGIO REQUENA is a very pleasant 72 y.o.-year old male with a  history of  atrial fibrillation, hypertension, hyperlipidemia, and signs and symptoms of mild parkinsonism without much in the way of lateralization. He presents for a sooner than scheduled appointment to discuss his medications His exam is stable.   I again had a long chat with the patient about my findings and the diagnosis, its prognosis and treatment options. He may have R sided predom. IPD (d/t intermittent tremor on R side) vs. Parkinsonism. We talked about medical treatments and non-pharmacological approaches. We talked about maintaining a healthy lifestyle in general. I encouraged the patient to eat healthy, exercise daily and keep well hydrated, to keep a scheduled bedtime and wake time routine, to not skip any meals and eat healthy snacks in between meals and to have protein with every meal.   I recommended the following at this time: continue Gabapentin 100 mg capsule one pill 3 times a day for his tingling. Increase generic Sinemet 25/100 mg strength to qid: at 7, 11, 3 and 7PM.   I answered all his questions today and the patient was in agreement with the plan. I would like to see him back in 3 months, sooner if the need arises and encouraged him to call with any interim questions, concerns, problems or updates and refill requests.

## 2012-09-21 ENCOUNTER — Encounter: Payer: Medicare Other | Admitting: Neurology

## 2012-09-22 DIAGNOSIS — M1710 Unilateral primary osteoarthritis, unspecified knee: Secondary | ICD-10-CM | POA: Diagnosis not present

## 2012-09-27 DIAGNOSIS — I4891 Unspecified atrial fibrillation: Secondary | ICD-10-CM | POA: Diagnosis not present

## 2012-09-27 DIAGNOSIS — Z7901 Long term (current) use of anticoagulants: Secondary | ICD-10-CM | POA: Diagnosis not present

## 2012-10-20 DIAGNOSIS — Z7901 Long term (current) use of anticoagulants: Secondary | ICD-10-CM | POA: Diagnosis not present

## 2012-10-20 DIAGNOSIS — E782 Mixed hyperlipidemia: Secondary | ICD-10-CM | POA: Diagnosis not present

## 2012-10-20 DIAGNOSIS — I4891 Unspecified atrial fibrillation: Secondary | ICD-10-CM | POA: Diagnosis not present

## 2012-10-20 DIAGNOSIS — I10 Essential (primary) hypertension: Secondary | ICD-10-CM | POA: Diagnosis not present

## 2012-10-20 DIAGNOSIS — G20 Parkinson's disease: Secondary | ICD-10-CM | POA: Diagnosis not present

## 2012-10-20 DIAGNOSIS — R490 Dysphonia: Secondary | ICD-10-CM | POA: Diagnosis not present

## 2012-10-20 DIAGNOSIS — K219 Gastro-esophageal reflux disease without esophagitis: Secondary | ICD-10-CM | POA: Diagnosis not present

## 2012-11-18 DIAGNOSIS — Z961 Presence of intraocular lens: Secondary | ICD-10-CM | POA: Diagnosis not present

## 2012-11-18 DIAGNOSIS — H40019 Open angle with borderline findings, low risk, unspecified eye: Secondary | ICD-10-CM | POA: Diagnosis not present

## 2012-11-19 DIAGNOSIS — K219 Gastro-esophageal reflux disease without esophagitis: Secondary | ICD-10-CM | POA: Diagnosis not present

## 2012-11-19 DIAGNOSIS — Z7901 Long term (current) use of anticoagulants: Secondary | ICD-10-CM | POA: Diagnosis not present

## 2012-11-19 DIAGNOSIS — I4891 Unspecified atrial fibrillation: Secondary | ICD-10-CM | POA: Diagnosis not present

## 2012-11-24 ENCOUNTER — Telehealth: Payer: Self-pay | Admitting: *Deleted

## 2012-11-24 ENCOUNTER — Other Ambulatory Visit: Payer: Self-pay | Admitting: *Deleted

## 2012-11-24 ENCOUNTER — Ambulatory Visit: Payer: Medicare Other | Admitting: Neurology

## 2012-11-24 NOTE — Telephone Encounter (Signed)
Called patient phone left a voicemail letting him know his doctor will not be in today. And his appointment has been cancelled to call us i provided the number so he can reschedule. 8:11am RR

## 2012-11-25 ENCOUNTER — Telehealth: Payer: Self-pay

## 2012-11-25 NOTE — Telephone Encounter (Signed)
The patient called clinic requesting a refill on Sinemet with QID dose.  Says he has been getting refills with TID dose.  An Rx for four daily was already sent to the pharmacy on 08/23/2012 for 90 days plus 3 refills (E-Prescribing Status: Receipt confirmed by pharmacy (08/23/2012 11:55 AM EDT) The pharmacy had this Rx saved on the file, and they will fill it for th patient, and contact him when it's ready for pick up.

## 2012-11-30 ENCOUNTER — Ambulatory Visit (INDEPENDENT_AMBULATORY_CARE_PROVIDER_SITE_OTHER): Payer: Medicare Other | Admitting: Neurology

## 2012-11-30 ENCOUNTER — Encounter: Payer: Self-pay | Admitting: Neurology

## 2012-11-30 VITALS — BP 149/89 | HR 52 | Temp 98.8°F | Ht 73.0 in | Wt 260.0 lb

## 2012-11-30 DIAGNOSIS — G20 Parkinson's disease: Secondary | ICD-10-CM | POA: Diagnosis not present

## 2012-11-30 DIAGNOSIS — I4891 Unspecified atrial fibrillation: Secondary | ICD-10-CM | POA: Diagnosis not present

## 2012-11-30 DIAGNOSIS — R202 Paresthesia of skin: Secondary | ICD-10-CM

## 2012-11-30 DIAGNOSIS — R209 Unspecified disturbances of skin sensation: Secondary | ICD-10-CM

## 2012-11-30 MED ORDER — GABAPENTIN 100 MG PO CAPS: 100.0000 mg | ORAL_CAPSULE | Freq: Three times a day (TID) | ORAL | Status: DC

## 2012-11-30 NOTE — Patient Instructions (Addendum)
I think overall you are doing fairly well but I do want to suggest a few things today:  Remember to drink plenty of fluid, eat healthy meals and do not skip any meals. Try to eat protein with a every meal and eat a healthy snack such as fruit or nuts in between meals. Try to keep a regular sleep-wake schedule and try to exercise daily, particularly in the form of walking, 20-30 minutes a day, if you can.   As far as your medications are concerned, I would like to suggest no change.    As far as diagnostic testing: no new test.  Try to stand up straight, drink more water and try to swing your arms, especially the right, when walking.   I would like to see you back in 6 months, sooner if we need to. Please call us with any interim questions, concerns, problems, updates or refill requests.

## 2012-11-30 NOTE — Progress Notes (Signed)
Subjective:    Patient ID: Brian [name] is a 72 y.o. male.  HPI  Interim history:   Brian [name] is a 72 year old right-handed gentleman who presents for followup consultation of his parkinsonism. I first met him on 06/22/2012 and he presented on 08/23/2012 for a sooner than scheduled appointment because he felt his medication was not helpful. He has an underlying medical history of atrial fibrillation, hypertension, hyperlipidemia and OSA and previously followed with Dr. Fayrene Fearing love and had been complaining of paresthesias. Dr. Sandria Manly had started him on low-dose gabapentin. For his parkinsonism which was first noted in June of 2012 he was tried on pramipexole which helped, but at his first visit with me he told me that his gabapentin was not helpful and he also discontinued pramipexole a few months prior because of swelling in his feet and ankles. This improved after stopping both medications. At the time of his first visit with me I felt he had mild parkinsonism without much in the way of lateralization. I started him on gabapentin again because of his paresthesias and also asked him to start low-dose Sinemet. He called back stating that the Sinemet was not helpful. He requested a sooner appointment. He had been continuing taking gabapentin 100 mg 3 times a day and Sinemet one pill 3 times a day with minimal improvement as he reported last time. I suggested at the last visit that he increase his Sinemet to one pill 4 times a day and continue with gabapentin 100 mg 3 times a day. I also felt that he may have right sided predominant idiopathic Parkinson's disease due to an intermittent tremor noted only on the right side last time. He reports no new symptoms. His tremor is better since the increase in L-dopa and he is taking it 1 pill at 7, 11, 3PM, 7PM. He takes the gabapentin at 7 AM, 11 and 3 PM and he feels, the tingling is less noticible.   His Past Medical History Is Significant For: Past Medical  History  Diagnosis Date  . Atrial fibrillation     persistent afib  . Hypertension   . Dyslipidemia   . Coronary artery disease     Non-obstructive  . Mitral regurgitation     Mild  . Abnormal PFT 1. 05/18/08  2. 11/30/08    1. Showed mild airflow obstruction, mild restriction, mild diffusion defect; FEV1 2.22(64%), FVC 3.33(65%), FEVi% 67, TLC 5.19(69%), DLCO 77%, +BD  2. FEV1 2.38(73%), FVC 3.81(80%), FEV1% 63, TLC 5.61(80%), DLCO 79%, no BD  . Parkinsonism 06/22/2012  . Paresthesias 06/22/2012    His Past Surgical History Is Significant For: Past Surgical History  Procedure Laterality Date  . Cataract extraction    . Cardioversion      multiple    His Family History Is Significant For: Family History  Problem Relation Age of Onset  . Diabetes Mother     DM  . Stroke Father     CVA  . Pancreatic cancer Brother   . Pancreatic cancer Other     Nephew    His Social History Is Significant For: History   Social History  . Marital Status: Married    Spouse Name: N/A    Number of Children: 4  . Years of Education: N/A   Occupational History  . Benefit analyst     Worked with [employer]   Social History Main Topics  . Smoking status: Never Smoker   . Smokeless tobacco: Never Used  .  Alcohol Use: No     Comment: occasionally  . Drug Use: No  . Sexual Activity: None   Other Topics Concern  . None   Social History Narrative   Married and lives in [place].  Retired Airline pilot.    His Allergies Are:  Allergies  Allergen Reactions  . Lisinopril Other (See Comments)    coughing  :   His Current Medications Are:  Outpatient Encounter Prescriptions as of 11/30/2012  Medication Sig Dispense Refill  . amLODipine-olmesartan (AZOR) 10-40 MG per tablet Take 1 tablet by mouth daily.        . carbidopa-levodopa (SINEMET IR) 25-100 MG per tablet Take 1 tablet by mouth 4 (four) times daily. At 8AM, 2PM, and 8PM. Start on 07/22/12  360 tablet  3  . Cholecalciferol  (VITAMIN D3) 1000 UNITS CAPS Take by mouth daily.        Marland Kitchen doxazosin (CARDURA) 2 MG tablet       . famotidine (PEPCID) 40 MG tablet       . furosemide (LASIX) 20 MG tablet       . gabapentin (NEURONTIN) 100 MG capsule Take 100 mg by mouth 3 (three) times daily.      . Multiple Vitamins-Minerals (CENTRUM SILVER PO) Take by mouth daily.        . nebivolol (BYSTOLIC) 5 MG tablet Take 5 mg by mouth daily.        . pantoprazole (PROTONIX) 40 MG tablet       . pravastatin (PRAVACHOL) 40 MG tablet Take 40 mg by mouth daily.        Marland Kitchen warfarin (COUMADIN) 5 MG tablet Take 5 mg by mouth as directed.         No facility-administered encounter medications on file as of 11/30/2012.  : Review of Systems  All other systems reviewed and are negative.    Objective:  Neurologic Exam  Physical Exam Physical Examination:   Filed Vitals:   11/30/12 1145  BP: 149/89  Pulse: 52  Temp: 98.8 F (37.1 C)    General Examination: The patient is a very pleasant 72 y.o. male in no acute distress.  HEENT: Normocephalic, atraumatic, pupils are equal, round and reactive to light and accommodation. Extraocular tracking shows mild saccadic breakdown without nystagmus noted. There is no limitation to gaze. There is no apraxia of eyelid opening. There is mild decrease in eye blink rate. Hearing is intact. Face is symmetric with moderate facial masking and normal facial sensation. There is no lip, neck or jaw tremor. Neck is mildly to moderately rigid with intact passive ROM. There are no carotid bruits on auscultation. Oropharynx exam reveals no mouth dryness. No significant airway crowding is noted. Mallampati is class II. Tongue protrudes centrally and palate elevates symmetrically.    Chest: is clear to auscultation without wheezing, rhonchi or crackles noted.  Heart: sounds are irregularly irregular without murmurs, rubs or gallops noted.   Abdomen: is soft, non-tender and non-distended with normal bowel sounds  appreciated on auscultation.  Extremities: There is trace edema in the ankles bilaterally, not worse.   Skin: is warm and dry with no trophic changes noted.  Musculoskeletal: exam reveals no obvious joint deformities, tenderness or joint swelling or erythema.  Neurologically:  Mental status: The patient is awake and alert, paying good  attention. He is able to to provide a good history. He is oriented to: person, place, time/date, situation, day of week, month of year and year. His memory, attention, language  and knowledge are intact. There is no aphasia, agnosia, apraxia or anomia. There is a mild degree of bradyphrenia. Speech is mildly hypophonic with no dysarthria noted. Mood is congruent and affect is normal.  Cranial nerves are as described above under HEENT exam. In addition, shoulder shrug is normal with equal shoulder height noted.  Motor exam: Normal bulk, and strength for age is noted. Tone is mildly rigid with absence of cogwheeling in the bilateral extremities. There is a mild intermittent R hand tremor today. There is overall mild bradykinesia. There is no drift or rebound. Romberg is negative. Reflexes are 1+ in the upper extremities and 1+ in the lower extremities. Fine motor skills exam reveals: Finger taps are mild to moderately impaired on the right and mildly impaired on the left. Hand movements are mildly impaired on the right and mildly impaired on the left. RAP (rapid alternating patting) is mildly impaired on the right and mildly impaired on the left. Foot taps are moderately impaired on the right and mildly to moderately impaired on the left. Foot agility (in the form of heel stomping) is mildly impaired on the right and mildly impaired on the left.    Cerebellar testing shows no dysmetria or intention tremor on finger to nose testing. There is no truncal or gait ataxia.   Sensory exam is intact to light touch.   Gait, station and balance exam: He stands up from the seated  position with no significant difficulty. No veering to one side is noted. He is not noted to lean to one side. Posture is mildly to moderately stooped. Stance is narrow-based. He walks with decrease in stride length and pace and decreased arm swing on both sides, R more than L. He turns in 3 steps. Tandem walk is not possible. Balance is mildly impaired.   Assessment and Plan:   In summary, Brian [name] is a very pleasant 72 y.o.-year old male with a history of  atrial fibrillation, hypertension, hyperlipidemia, and signs and a Hx and exam c/w R sided predom. IPD (d/t intermittent tremor on R side). I believe, he has right sided PD, rather than atypical parkinsonism. We talked about medical treatments and non-pharmacological approaches. We talked about maintaining a healthy lifestyle in general. I encouraged the patient to eat healthy, exercise daily and keep well hydrated, to keep a scheduled bedtime and wake time routine, to not skip any meals and eat healthy snacks in between meals and to have protein with every meal. For his paresthesias, he is advised to continue Gabapentin 100 mg capsule one pill 3 times a day and I would like for him to continue Sinemet 25/100 mg strength qid, namely at 7, 11, 3 and 7 PM.   I answered all his questions today and the patient was in agreement with the plan. I would like to see him back in 6 months, sooner if the need arises and encouraged him to call with any interim questions, concerns, problems or updates and refill requests.

## 2012-12-21 DIAGNOSIS — I4891 Unspecified atrial fibrillation: Secondary | ICD-10-CM | POA: Diagnosis not present

## 2012-12-21 DIAGNOSIS — N400 Enlarged prostate without lower urinary tract symptoms: Secondary | ICD-10-CM | POA: Diagnosis not present

## 2012-12-21 DIAGNOSIS — Z7901 Long term (current) use of anticoagulants: Secondary | ICD-10-CM | POA: Diagnosis not present

## 2012-12-21 DIAGNOSIS — R350 Frequency of micturition: Secondary | ICD-10-CM | POA: Diagnosis not present

## 2013-01-25 ENCOUNTER — Ambulatory Visit (INDEPENDENT_AMBULATORY_CARE_PROVIDER_SITE_OTHER): Payer: Medicare Other | Admitting: General Practice

## 2013-01-25 DIAGNOSIS — I4891 Unspecified atrial fibrillation: Secondary | ICD-10-CM

## 2013-01-25 DIAGNOSIS — R51 Headache: Secondary | ICD-10-CM | POA: Diagnosis not present

## 2013-01-25 DIAGNOSIS — Z23 Encounter for immunization: Secondary | ICD-10-CM | POA: Diagnosis not present

## 2013-01-25 DIAGNOSIS — Z7901 Long term (current) use of anticoagulants: Secondary | ICD-10-CM

## 2013-02-02 ENCOUNTER — Encounter: Payer: Self-pay | Admitting: Cardiology

## 2013-02-04 ENCOUNTER — Ambulatory Visit (INDEPENDENT_AMBULATORY_CARE_PROVIDER_SITE_OTHER): Payer: Medicare Other | Admitting: Cardiology

## 2013-02-04 ENCOUNTER — Encounter: Payer: Self-pay | Admitting: Cardiology

## 2013-02-04 VITALS — BP 170/85 | HR 70 | Ht 73.0 in | Wt 263.0 lb

## 2013-02-04 DIAGNOSIS — I10 Essential (primary) hypertension: Secondary | ICD-10-CM

## 2013-02-04 DIAGNOSIS — I4891 Unspecified atrial fibrillation: Secondary | ICD-10-CM | POA: Diagnosis not present

## 2013-02-04 DIAGNOSIS — R609 Edema, unspecified: Secondary | ICD-10-CM | POA: Diagnosis not present

## 2013-02-04 DIAGNOSIS — R600 Localized edema: Secondary | ICD-10-CM

## 2013-02-04 DIAGNOSIS — J984 Other disorders of lung: Secondary | ICD-10-CM | POA: Insufficient documentation

## 2013-02-04 DIAGNOSIS — J309 Allergic rhinitis, unspecified: Secondary | ICD-10-CM | POA: Insufficient documentation

## 2013-02-04 DIAGNOSIS — L0390 Cellulitis, unspecified: Secondary | ICD-10-CM | POA: Insufficient documentation

## 2013-02-04 DIAGNOSIS — Z7901 Long term (current) use of anticoagulants: Secondary | ICD-10-CM

## 2013-02-04 DIAGNOSIS — K219 Gastro-esophageal reflux disease without esophagitis: Secondary | ICD-10-CM | POA: Insufficient documentation

## 2013-02-04 DIAGNOSIS — N182 Chronic kidney disease, stage 2 (mild): Secondary | ICD-10-CM | POA: Insufficient documentation

## 2013-02-04 DIAGNOSIS — M1990 Unspecified osteoarthritis, unspecified site: Secondary | ICD-10-CM | POA: Insufficient documentation

## 2013-02-04 DIAGNOSIS — F419 Anxiety disorder, unspecified: Secondary | ICD-10-CM | POA: Insufficient documentation

## 2013-02-04 MED ORDER — AMLODIPINE BESYLATE 10 MG PO TABS: 10.0000 mg | ORAL_TABLET | Freq: Every day | ORAL | Status: DC

## 2013-02-04 MED ORDER — DOXAZOSIN MESYLATE 2 MG PO TABS: 4.0000 mg | ORAL_TABLET | Freq: Every day | ORAL | Status: DC

## 2013-02-04 MED ORDER — OLMESARTAN MEDOXOMIL 40 MG PO TABS: 40.0000 mg | ORAL_TABLET | Freq: Every day | ORAL | Status: DC

## 2013-02-04 NOTE — Progress Notes (Signed)
[address] [place], [place]  [address] Phone: [phone] Fax:  [phone]  Date:  02/04/2013   ID:  Brian [name], DOB [birth year], MRN [id number]  PCP:  Georgann Housekeeper, MD  Cardiologist:  Armanda Magic, MD     History of Present Illness: Brian [name] is a 72 y.o. male with chronic atrial fibrillation, chronic anticoagulation, chronic LE edema and HTN who presents today for followup.  He is doing well.  He denies any chest pain, SOB, DOE, LE edema, dizziness, palpitations or syncope.  Since I saw him his LE edema has resolved with TED hose compression stockings.   Wt Readings from Last 3 Encounters:  02/04/13 263 lb (119.296 kg)  11/30/12 260 lb (117.935 kg)  08/23/12 260 lb (117.935 kg)     Past Medical History  Diagnosis Date  . Dyslipidemia   . Mitral regurgitation     Mild  . Abnormal PFT 1. 05/18/08  2. 11/30/08    1. Showed mild airflow obstruction, mild restriction, mild diffusion defect; FEV1 2.22(64%), FVC 3.33(65%), FEVi% 67, TLC 5.19(69%), DLCO 77%, +BD  2. FEV1 2.38(73%), FVC 3.81(80%), FEV1% 63, TLC 5.61(80%), DLCO 79%, no BD  . Parkinsonism 06/22/2012  . Paresthesias 06/22/2012  . Hyperlipidemia   . GERD (gastroesophageal reflux disease)   . Erectile dysfunction   . BPH (benign prostatic hyperplasia)   . Restrictive lung disease   . CKD (chronic kidney disease) stage 2, GFR 60-89 ml/min   . Cellulitis     right leg MRSA  . Osteoarthritis   . Allergic rhinitis   . Anxiety   . Hypertension   . Atrial fibrillation     persistent afib    Current Outpatient Prescriptions  Medication Sig Dispense Refill  . amLODipine-olmesartan (AZOR) 10-40 MG per tablet Take 1 tablet by mouth daily.        . carbidopa-levodopa (SINEMET IR) 25-100 MG per tablet Take 1 tablet by mouth 4 (four) times daily. At 8AM, 2PM, and 8PM. Start on 07/22/12  360 tablet  3  . Cholecalciferol (VITAMIN D3) 1000 UNITS CAPS Take by mouth daily.        Marland Kitchen doxazosin (CARDURA) 2 MG tablet        . famotidine (PEPCID) 40 MG tablet Take 40 mg by mouth daily.       . furosemide (LASIX) 20 MG tablet Take 20 mg by mouth as needed.       . gabapentin (NEURONTIN) 100 MG capsule Take 1 capsule (100 mg total) by mouth 3 (three) times daily.  270 capsule  3  . Multiple Vitamins-Minerals (CENTRUM SILVER PO) Take by mouth daily.        . nebivolol (BYSTOLIC) 5 MG tablet Take 10 mg by mouth daily.       . pravastatin (PRAVACHOL) 40 MG tablet Take 40 mg by mouth daily.       Marland Kitchen warfarin (COUMADIN) 5 MG tablet Take 5 mg by mouth as directed.         No current facility-administered medications for this visit.    Allergies:    Allergies  Allergen Reactions  . Lisinopril Other (See Comments)    coughing    Social History:  The patient  reports that he has never smoked. He has never used smokeless tobacco. He reports that he does not drink alcohol or use illicit drugs.   Family History:  The patient's family history includes Diabetes in his mother; Pancreatic cancer in his brother  and other; Stroke in his father.   ROS:  Please see the history of present illness.      All other systems reviewed and negative.   PHYSICAL EXAM: VS:  BP 170/85  Pulse 70  Ht 6\' 1"  (1.854 m)  Wt 263 lb (119.296 kg)  BMI 34.71 kg/m2 Well nourished, well developed, in no acute distress HEENT: normal Neck: no JVD Cardiac:  normal S1, S2; RRR; no murmur Lungs:  clear to auscultation bilaterally, no wheezing, rhonchi or rales Abd: soft, nontender, no hepatomegaly Ext: no edema Skin: warm and dry Neuro:  CNs 2-12 intact, no focal abnormalities noted       ASSESSMENT AND PLAN:  1. Chronic atrial fibrillation rated controlled  - continue Bystolic/Warfarin 2. HTN - BP mildly elevated  - his Azor will no longer be covered by his insurance so I will give him new scripts for amlodipine 10mg  daily and Olmesartan 40mg  daily.    - increase doxazosin to 4mg  daily  - check BP daily for a week and call with  results 3. Chronic LE edema - resolved  - continue Lasix 4. Chronic systemic anticoagulation  Followup with me in 6 months  Signed, Armanda Magic, MD 02/04/2013 8:59 AM

## 2013-02-04 NOTE — Patient Instructions (Signed)
Your physician has recommended you make the following change in your medication: Stop Azor, Start Amlodipine 10 Mg daily, Start Olmesartan 40 Mg Daily, and increase Doxazosin to 4 Mg daily. All meds have been sent to your pharmacy.  Please get a larger cuff for your BP machine and take your BP readings daily for a week and call with the results.   Your physician wants you to follow-up in: 6 Months with Dr. Sherlyn Lick will receive a reminder letter in the mail two months in advance. If you don't receive a letter, please call our office to schedule the follow-up appointment.

## 2013-02-08 ENCOUNTER — Telehealth: Payer: Self-pay | Admitting: Cardiology

## 2013-02-08 NOTE — Telephone Encounter (Signed)
Pt called because he said he was taking Doxazosin 4 mg tablet once a day , he does not understand why Dr. Mayford Knife prescribed 2 mg  pt is to take 2 tablets daily. Pt states it is confusing. Pt is aware to take one  4 mg tablet till finish then take the 2 tablets daily. I offered pt to change the prescription  medication to 4 mg . Pt states he will call back to have it changed when is time for refills.

## 2013-02-08 NOTE — Telephone Encounter (Signed)
New Problem:  Pt states he has questions about his meds. Pt states he will give more details when a nurse calls him back. Please advise

## 2013-02-22 ENCOUNTER — Telehealth: Payer: Self-pay | Admitting: Cardiology

## 2013-02-22 DIAGNOSIS — I10 Essential (primary) hypertension: Secondary | ICD-10-CM

## 2013-02-22 MED ORDER — DOXAZOSIN MESYLATE 2 MG PO TABS: 6.0000 mg | ORAL_TABLET | Freq: Every day | ORAL | Status: DC

## 2013-02-22 NOTE — Telephone Encounter (Signed)
New Problem:  Pt is calling to report his BP readings.   11/19   --- 138/85 11/21  ---- 159/87 11/24  ----  158/91 11/25  --- 149/74 12/2 ----  141/89

## 2013-02-22 NOTE — Telephone Encounter (Signed)
To Dr. Turner, please advise.  

## 2013-02-22 NOTE — Telephone Encounter (Signed)
BP still elevated so please increase Doxazosin to 6mg  daily and have patient check BP daily for a week and call with results

## 2013-02-22 NOTE — Telephone Encounter (Signed)
See note prior

## 2013-02-22 NOTE — Telephone Encounter (Signed)
FYI to [employer].

## 2013-02-22 NOTE — Telephone Encounter (Signed)
Spoke with pt and gave him instructions from Dr. Mayford Knife. Will send new prescription to [facility] on [address].

## 2013-03-03 ENCOUNTER — Telehealth: Payer: Self-pay | Admitting: Cardiology

## 2013-03-03 NOTE — Telephone Encounter (Signed)
BP adequately controlled continue current meds

## 2013-03-03 NOTE — Telephone Encounter (Signed)
New Problem:  Pt is calling in to report his BP and Heart Rate for the past week...  Date       BP      Heart Rate  12/3    133/87     81 12/4     133/85    66 12/5     144/76    59 12/6     140/81    69 12/7     144/76    69 12/8     150/86    70 12/9     147/87    64 12/10    128/84    77

## 2013-03-03 NOTE — Telephone Encounter (Signed)
Pt made aware

## 2013-03-03 NOTE — Telephone Encounter (Signed)
To Dr Turner to advise 

## 2013-03-08 ENCOUNTER — Ambulatory Visit (INDEPENDENT_AMBULATORY_CARE_PROVIDER_SITE_OTHER): Payer: Medicare Other | Admitting: General Practice

## 2013-03-08 DIAGNOSIS — I4891 Unspecified atrial fibrillation: Secondary | ICD-10-CM

## 2013-03-08 DIAGNOSIS — Z7901 Long term (current) use of anticoagulants: Secondary | ICD-10-CM

## 2013-03-08 LAB — POCT INR: INR: 3.1

## 2013-04-12 ENCOUNTER — Ambulatory Visit (INDEPENDENT_AMBULATORY_CARE_PROVIDER_SITE_OTHER): Payer: Medicare Other | Admitting: *Deleted

## 2013-04-12 DIAGNOSIS — I4891 Unspecified atrial fibrillation: Secondary | ICD-10-CM

## 2013-04-12 DIAGNOSIS — Z7901 Long term (current) use of anticoagulants: Secondary | ICD-10-CM

## 2013-04-12 LAB — POCT INR: INR: 3.1

## 2013-04-25 DIAGNOSIS — K219 Gastro-esophageal reflux disease without esophagitis: Secondary | ICD-10-CM | POA: Diagnosis not present

## 2013-04-25 DIAGNOSIS — N182 Chronic kidney disease, stage 2 (mild): Secondary | ICD-10-CM | POA: Diagnosis not present

## 2013-04-25 DIAGNOSIS — Z1331 Encounter for screening for depression: Secondary | ICD-10-CM | POA: Diagnosis not present

## 2013-04-25 DIAGNOSIS — N400 Enlarged prostate without lower urinary tract symptoms: Secondary | ICD-10-CM | POA: Diagnosis not present

## 2013-04-25 DIAGNOSIS — Z0000 Encounter for general adult medical examination without abnormal findings: Secondary | ICD-10-CM | POA: Diagnosis not present

## 2013-04-25 DIAGNOSIS — I10 Essential (primary) hypertension: Secondary | ICD-10-CM | POA: Diagnosis not present

## 2013-04-25 DIAGNOSIS — Z23 Encounter for immunization: Secondary | ICD-10-CM | POA: Diagnosis not present

## 2013-04-25 DIAGNOSIS — G20 Parkinson's disease: Secondary | ICD-10-CM | POA: Diagnosis not present

## 2013-04-25 DIAGNOSIS — I4891 Unspecified atrial fibrillation: Secondary | ICD-10-CM | POA: Diagnosis not present

## 2013-04-25 DIAGNOSIS — E782 Mixed hyperlipidemia: Secondary | ICD-10-CM | POA: Diagnosis not present

## 2013-05-03 ENCOUNTER — Ambulatory Visit (INDEPENDENT_AMBULATORY_CARE_PROVIDER_SITE_OTHER): Payer: Medicare Other | Admitting: Pharmacist

## 2013-05-03 DIAGNOSIS — I4891 Unspecified atrial fibrillation: Secondary | ICD-10-CM

## 2013-05-03 DIAGNOSIS — Z7901 Long term (current) use of anticoagulants: Secondary | ICD-10-CM | POA: Diagnosis not present

## 2013-05-03 LAB — POCT INR: INR: 2.9

## 2013-05-23 DIAGNOSIS — M25559 Pain in unspecified hip: Secondary | ICD-10-CM | POA: Diagnosis not present

## 2013-05-23 DIAGNOSIS — M1710 Unilateral primary osteoarthritis, unspecified knee: Secondary | ICD-10-CM | POA: Diagnosis not present

## 2013-05-23 DIAGNOSIS — IMO0002 Reserved for concepts with insufficient information to code with codable children: Secondary | ICD-10-CM | POA: Diagnosis not present

## 2013-05-23 DIAGNOSIS — M5137 Other intervertebral disc degeneration, lumbosacral region: Secondary | ICD-10-CM | POA: Diagnosis not present

## 2013-05-31 ENCOUNTER — Encounter (INDEPENDENT_AMBULATORY_CARE_PROVIDER_SITE_OTHER): Payer: Self-pay

## 2013-05-31 ENCOUNTER — Encounter: Payer: Self-pay | Admitting: Neurology

## 2013-05-31 ENCOUNTER — Ambulatory Visit (INDEPENDENT_AMBULATORY_CARE_PROVIDER_SITE_OTHER): Payer: Medicare Other | Admitting: Neurology

## 2013-05-31 VITALS — BP 154/88 | HR 76 | Temp 97.6°F | Ht 73.0 in | Wt 266.0 lb

## 2013-05-31 DIAGNOSIS — G20 Parkinson's disease: Secondary | ICD-10-CM

## 2013-05-31 DIAGNOSIS — R202 Paresthesia of skin: Secondary | ICD-10-CM

## 2013-05-31 DIAGNOSIS — I4891 Unspecified atrial fibrillation: Secondary | ICD-10-CM

## 2013-05-31 DIAGNOSIS — K5900 Constipation, unspecified: Secondary | ICD-10-CM | POA: Diagnosis not present

## 2013-05-31 DIAGNOSIS — R209 Unspecified disturbances of skin sensation: Secondary | ICD-10-CM

## 2013-05-31 NOTE — Patient Instructions (Addendum)
I think your Parkinson's disease has remained fairly stable, which is reassuring. Nevertheless, as you know, this disease does progress with time. It can affect your balance, your memory, your mood, your bowel and bladder function, your posture, balance and walking. Overall you are doing fairly well but I do want to suggest a few things today:  Remember to drink plenty of fluid, eat healthy meals and do not skip any meals. Try to eat protein with a every meal and eat a healthy snack such as fruit or nuts in between meals. Try to keep a regular sleep-wake schedule and try to exercise daily, particularly in the form of walking, 20-30 minutes a day, if you can.   Taking your medication on schedule is key.   Try to stay active physically and mentally. Engage in social activities in your community and with your family and try to keep up with current events by reading the newspaper or watching the news. Try to do word puzzles and you may like to do word puzzles and brain games on the computer such as on [email or web address]   As far as your medications are concerned, I would like to suggest that you take your current medication with the following additional changes: none today.    As far as diagnostic testing, I will order: no new test needed from my end of things.  I would like to see you back in 6 months, sooner if we need to. Please call us with any interim questions, concerns, problems, updates or refill requests.  Our nursing staff will answer any of your questions and relay your messages to me and also relay most of my messages to you.  Our phone number is [phone]. We also have an after hours call service for urgent matters and there is a physician on-call for urgent questions, that cannot wait till the next work day. For any emergencies you know to call 911 or go to the nearest emergency room.

## 2013-05-31 NOTE — Progress Notes (Signed)
Subjective:    Patient ID: Brian [name] is a 73 y.o. male.  HPI    Interim history:   Brian [name] is a 73 year old right-handed gentleman with an underlying medical history of atrial fibrillation, hypertension, OSA, and hyperlipidemia who presents for followup consultation of his right-sided predominant Parkinson's disease. He is unaccompanied today. I last saw him on 11/30/2012, at which time I suggested that he continue gabapentin 100 mg 1 pill 3 times a day for his paresthesias and encouraged him to continue Sinemet with 25/100 mg strength one tablet 4 times a day, at 7, 11, 3 PM and 7 PM.   Today, he reports, that going up to qid with C/L, seemed to be the key. He still takes gabapentin 100 mg tid, now primarily d/t tingling in his face, which is tolerable. He reports no SE with either medication and has not fallen recently. He has LBP and went to [facility]. He had an Xray of the back and was told there was degenerative disease and was given and was given a 3 week taper of oral steroids. He is almost done with the taper and it seemed to help a lot. He stopped exercising about a month ago d/t the pain, but is hoping to re-start soon. He is scheduled for PT evaluation tomorrow through ortho. He takes metamucil for constipation which helps.  I first met him on 06/22/2012 and he presented on 08/23/2012 for a sooner than scheduled appointment because he felt his medication was not helpful. He previously followed with Dr. Jeneen Rinks love and had been complaining of paresthesias. Dr. Erling Cruz had started him on low-dose gabapentin. For his parkinsonism which was first noted in June of 2012 he was tried on pramipexole which helped, but at his first visit with me he told me that his gabapentin was not helpful and he also discontinued pramipexole a few months prior because of swelling in his feet and ankles. This improved after stopping both medications. At the time of his first visit with me I felt he had mild  parkinsonism without much in the way of lateralization. I started him on gabapentin again because of his paresthesias and also asked him to start low-dose Sinemet. He called back stating that the Sinemet was not helpful. He requested a sooner appointment. He had been continuing taking gabapentin 100 mg 3 times a day and Sinemet one pill 3 times a day with minimal improvement as he reported last time. I suggested at the last visit that he increase his Sinemet to one pill 4 times a day and continue with gabapentin 100 mg 3 times a day. I also felt that he may have right sided predominant idiopathic Parkinson's disease due to an intermittent tremor noted only on the right side last time.   His Past Medical History Is Significant For: Past Medical History  Diagnosis Date  . Dyslipidemia   . Mitral regurgitation     Mild  . Abnormal PFT 1. 05/18/08  2. 11/30/08    1. Showed mild airflow obstruction, mild restriction, mild diffusion defect; FEV1 2.22(64%), FVC 3.33(65%), FEVi% 67, TLC 5.19(69%), DLCO 77%, +BD  2. FEV1 2.38(73%), FVC 3.81(80%), FEV1% 63, TLC 5.61(80%), DLCO 79%, no BD  . Parkinsonism 06/22/2012  . Paresthesias 06/22/2012  . Hyperlipidemia   . GERD (gastroesophageal reflux disease)   . Erectile dysfunction   . BPH (benign prostatic hyperplasia)   . Restrictive lung disease   . CKD (chronic kidney disease) stage 2, GFR 60-89 ml/min   .  Cellulitis     right leg MRSA  . Osteoarthritis   . Allergic rhinitis   . Anxiety   . Hypertension   . Atrial fibrillation     persistent afib    His Past Surgical History Is Significant For: Past Surgical History  Procedure Laterality Date  . Cataract extraction    . Cardioversion      multiple  . Cardiac catheterization  11/2009    normal coronary arteries    His Family History Is Significant For: Family History  Problem Relation Age of Onset  . Diabetes Mother     DM  . Stroke Father     CVA  . Pancreatic cancer Brother   . Pancreatic  cancer Other     Nephew    His Social History Is Significant For: History   Social History  . Marital Status: Married    Spouse Name: N/A    Number of Children: 4  . Years of Education: N/A   Occupational History  . Benefit analyst     Worked with [employer]   Social History Main Topics  . Smoking status: Never Smoker   . Smokeless tobacco: Never Used  . Alcohol Use: No     Comment: occasionally  . Drug Use: No  . Sexual Activity: None   Other Topics Concern  . None   Social History Narrative   Married and lives in [place].  Retired Optometrist.    His Allergies Are:  Allergies  Allergen Reactions  . Lisinopril Other (See Comments)    coughing  :   His Current Medications Are:  Outpatient Encounter Prescriptions as of 05/31/2013  Medication Sig  . amLODipine (NORVASC) 10 MG tablet Take 1 tablet (10 mg total) by mouth daily.  . carbidopa-levodopa (SINEMET IR) 25-100 MG per tablet Take 1 tablet by mouth 4 (four) times daily. At [place], [place], and 8PM. Start on 07/22/12  . Cholecalciferol (VITAMIN D3) 1000 UNITS CAPS Take by mouth daily.    Marland Kitchen doxazosin (CARDURA) 2 MG tablet Take 3 tablets (6 mg total) by mouth daily.  . famotidine (PEPCID) 40 MG tablet Take 40 mg by mouth daily.   . furosemide (LASIX) 20 MG tablet Take 20 mg by mouth as needed.   . gabapentin (NEURONTIN) 100 MG capsule Take 1 capsule (100 mg total) by mouth 3 (three) times daily.  . Multiple Vitamins-Minerals (CENTRUM SILVER PO) Take by mouth daily.    . nebivolol (BYSTOLIC) 5 MG tablet Take 10 mg by mouth daily.   Marland Kitchen olmesartan (BENICAR) 40 MG tablet Take 1 tablet (40 mg total) by mouth daily.  . pravastatin (PRAVACHOL) 40 MG tablet Take 40 mg by mouth daily.   Marland Kitchen warfarin (COUMADIN) 5 MG tablet Take 5 mg by mouth as directed.     Review of Systems:  Out of a complete 14 point review of systems, all are reviewed and negative with the exception of these symptoms as listed below:  Review of Systems   Constitutional: Negative.   HENT: Negative.   Eyes: Negative.   Respiratory: Negative.   Cardiovascular: Negative.   Gastrointestinal: Negative.   Endocrine: Negative.   Genitourinary: Negative.   Musculoskeletal: Negative.   Skin: Negative.   Allergic/Immunologic: Negative.   Neurological: Negative.   Hematological: Negative.   Psychiatric/Behavioral: Negative.   All other systems reviewed and are negative.   Objective:  Neurologic Exam  Physical Exam Physical Examination:   Filed Vitals:   05/31/13 1206  BP: 154/88  Pulse: 76  Temp: 97.6 F (36.4 C)   General Examination: The patient is a very pleasant 73 y.o. male in no acute distress. He appears well-developed and well-nourished and well groomed. He is overweight.   HEENT: Normocephalic, atraumatic, pupils are equal, round and reactive to light and accommodation. Extraocular tracking shows mild saccadic breakdown without nystagmus noted. There is no limitation to gaze. There is no apraxia of eyelid opening. There is mild decrease in eye blink rate. Hearing is intact. Face is symmetric with moderate facial masking and normal facial sensation. There is no lip, neck or jaw tremor. Neck is moderately rigid with intact passive ROM. There are no carotid bruits on auscultation. Oropharynx exam reveals no mouth dryness. No significant airway crowding is noted. Mallampati is class II. Tongue protrudes centrally and palate elevates symmetrically.    Chest: is clear to auscultation without wheezing, rhonchi or crackles noted.  Heart: sounds are irregularly irregular without murmurs, rubs or gallops noted.   Abdomen: is soft, non-tender and non-distended with normal bowel sounds appreciated on auscultation.  Extremities: There is trace edema in the ankles bilaterally, not worse.   Skin: is warm and dry with no trophic changes noted.   Musculoskeletal: exam reveals no obvious joint deformities, tenderness or joint swelling or  erythema.  Neurologically:  Mental status: The patient is awake and alert, paying good attention. He is able to to provide a good history. He is oriented to: person, place, time/date, situation, day of week, month of year and year. His memory, attention, language and knowledge are intact. There is no aphasia, agnosia, apraxia or anomia. There is a mild degree of bradyphrenia. Speech is mildly hypophonic with no dysarthria noted. Mood is congruent and affect is normal.  Cranial nerves are as described above under HEENT exam. In addition, shoulder shrug is normal with equal shoulder height noted.  Motor exam: Normal bulk, and strength for age is noted. Tone is mildly rigid with absence of cogwheeling in the bilateral extremities. Tone is increased on the R>L. There is no tremor today. There is overall mild bradykinesia. There is no drift or rebound. Romberg is negative. Reflexes are 1+ in the upper extremities and 1+ in the lower extremities. Toes are downgoing. Fine motor skills exam reveals: Finger taps are moderately impaired on the right and mildly impaired on the left. Hand movements are mildly impaired on the right and mildly impaired on the left. RAP (rapid alternating patting) is mildly impaired on the right and mildly impaired on the left. Foot taps are moderately to severely impaired on the right and moderately impaired on the left. Foot agility (in the form of heel stomping) is mildly impaired on the right and mildly impaired on the left.    Cerebellar testing shows no dysmetria or intention tremor on finger to nose testing. There is no truncal or gait ataxia.   Sensory exam is intact to light touch.   Gait, station and balance exam: He stands up from the seated position with no significant difficulty. No veering to one side is noted. He is not noted to lean to one side. Posture is mildly to moderately stooped. Stance is narrow-based. He walks with decrease in stride length and pace and  decreased arm swing on both sides, R more than L. He turns in 3 steps. Tandem walk is not possible. Balance is mildly impaired.   Assessment and Plan:   In summary, Brian [name] is a very pleasant 73 year old male with a  history of atrial fibrillation, hypertension, hyperlipidemia, who presents for followup consultation of his right-sided predominant Parkinson's disease, most likely akinetic-rigid type. He has remained fairly stable and indicates good results with levodopa therapy taken 4 times a day and for paresthesias he has been on gabapentin 100 mg 3 times a day. He has had some setback in his exercise routine because of lower back pain but is on treatment with this and is going to start physical therapy soon. We talked about maintaining a healthy lifestyle in general. I encouraged the patient to eat healthy, exercise daily and keep well hydrated, to keep a scheduled bedtime and wake time routine, to not skip any meals and eat healthy snacks in between meals and to have protein with every meal. I would like for him to continue Sinemet 25/100 mg strength qid, namely at 7, 11, 3 and 7 PM and gabapentin 100 mg 3 times a day. I renewed his prescriptions. I did discuss with him that down the Road we may increase his Sinemet by either increasing to a fifth dose or increasing each dose and keeping to 4 times a day schedule.   I answered all his questions today and the patient was in agreement with the plan. I would like to see him back in 6 months, sooner if the need arises and encouraged him to call with any interim questions, concerns, problems or updates and refill requests.

## 2013-06-01 DIAGNOSIS — IMO0002 Reserved for concepts with insufficient information to code with codable children: Secondary | ICD-10-CM | POA: Diagnosis not present

## 2013-06-01 DIAGNOSIS — M1710 Unilateral primary osteoarthritis, unspecified knee: Secondary | ICD-10-CM | POA: Diagnosis not present

## 2013-06-02 ENCOUNTER — Ambulatory Visit (INDEPENDENT_AMBULATORY_CARE_PROVIDER_SITE_OTHER): Payer: Medicare Other | Admitting: Pharmacist

## 2013-06-02 DIAGNOSIS — Z7901 Long term (current) use of anticoagulants: Secondary | ICD-10-CM

## 2013-06-02 DIAGNOSIS — I4891 Unspecified atrial fibrillation: Secondary | ICD-10-CM | POA: Diagnosis not present

## 2013-06-02 LAB — POCT INR: INR: 5.5

## 2013-06-07 ENCOUNTER — Ambulatory Visit (INDEPENDENT_AMBULATORY_CARE_PROVIDER_SITE_OTHER): Payer: Medicare Other | Admitting: Pharmacist

## 2013-06-07 DIAGNOSIS — Z7901 Long term (current) use of anticoagulants: Secondary | ICD-10-CM

## 2013-06-07 DIAGNOSIS — I4891 Unspecified atrial fibrillation: Secondary | ICD-10-CM

## 2013-06-07 LAB — POCT INR: INR: 2.7

## 2013-06-21 ENCOUNTER — Ambulatory Visit (INDEPENDENT_AMBULATORY_CARE_PROVIDER_SITE_OTHER): Payer: Medicare Other | Admitting: *Deleted

## 2013-06-21 DIAGNOSIS — Z7901 Long term (current) use of anticoagulants: Secondary | ICD-10-CM | POA: Diagnosis not present

## 2013-06-21 DIAGNOSIS — I4891 Unspecified atrial fibrillation: Secondary | ICD-10-CM

## 2013-06-21 DIAGNOSIS — Z5181 Encounter for therapeutic drug level monitoring: Secondary | ICD-10-CM

## 2013-06-21 LAB — POCT INR: INR: 4.2

## 2013-06-27 DIAGNOSIS — M5137 Other intervertebral disc degeneration, lumbosacral region: Secondary | ICD-10-CM | POA: Diagnosis not present

## 2013-07-05 ENCOUNTER — Ambulatory Visit (INDEPENDENT_AMBULATORY_CARE_PROVIDER_SITE_OTHER): Payer: Medicare Other | Admitting: *Deleted

## 2013-07-05 DIAGNOSIS — Z5181 Encounter for therapeutic drug level monitoring: Secondary | ICD-10-CM

## 2013-07-05 DIAGNOSIS — I4891 Unspecified atrial fibrillation: Secondary | ICD-10-CM

## 2013-07-05 DIAGNOSIS — Z7901 Long term (current) use of anticoagulants: Secondary | ICD-10-CM | POA: Diagnosis not present

## 2013-07-05 LAB — POCT INR: INR: 3.7

## 2013-07-12 DIAGNOSIS — N400 Enlarged prostate without lower urinary tract symptoms: Secondary | ICD-10-CM | POA: Diagnosis not present

## 2013-07-12 DIAGNOSIS — R351 Nocturia: Secondary | ICD-10-CM | POA: Diagnosis not present

## 2013-07-12 DIAGNOSIS — R609 Edema, unspecified: Secondary | ICD-10-CM | POA: Diagnosis not present

## 2013-07-14 ENCOUNTER — Telehealth: Payer: Self-pay | Admitting: Cardiology

## 2013-07-14 NOTE — Telephone Encounter (Signed)
Received letter in regard to using Steroid Dosepaks on Mr. Goncalves and interaction with Coumadin.  He is fine to continue to have Steroids when needed.  Please notify us though when he gets them so we can adjust his coumadin in the interim since steroids can change the INR.

## 2013-07-19 ENCOUNTER — Ambulatory Visit (INDEPENDENT_AMBULATORY_CARE_PROVIDER_SITE_OTHER): Payer: Medicare Other | Admitting: *Deleted

## 2013-07-19 DIAGNOSIS — I4891 Unspecified atrial fibrillation: Secondary | ICD-10-CM | POA: Diagnosis not present

## 2013-07-19 DIAGNOSIS — Z5181 Encounter for therapeutic drug level monitoring: Secondary | ICD-10-CM

## 2013-07-19 DIAGNOSIS — Z7901 Long term (current) use of anticoagulants: Secondary | ICD-10-CM

## 2013-07-19 LAB — POCT INR: INR: 3

## 2013-07-21 DIAGNOSIS — R351 Nocturia: Secondary | ICD-10-CM | POA: Diagnosis not present

## 2013-07-21 DIAGNOSIS — N401 Enlarged prostate with lower urinary tract symptoms: Secondary | ICD-10-CM | POA: Diagnosis not present

## 2013-07-22 DIAGNOSIS — R609 Edema, unspecified: Secondary | ICD-10-CM | POA: Diagnosis not present

## 2013-07-28 DIAGNOSIS — M1710 Unilateral primary osteoarthritis, unspecified knee: Secondary | ICD-10-CM | POA: Diagnosis not present

## 2013-08-09 ENCOUNTER — Ambulatory Visit (INDEPENDENT_AMBULATORY_CARE_PROVIDER_SITE_OTHER): Payer: Medicare Other | Admitting: Pharmacist

## 2013-08-09 DIAGNOSIS — Z7901 Long term (current) use of anticoagulants: Secondary | ICD-10-CM | POA: Diagnosis not present

## 2013-08-09 DIAGNOSIS — I4891 Unspecified atrial fibrillation: Secondary | ICD-10-CM

## 2013-08-09 DIAGNOSIS — Z5181 Encounter for therapeutic drug level monitoring: Secondary | ICD-10-CM | POA: Diagnosis not present

## 2013-08-09 LAB — POCT INR: INR: 3.5

## 2013-08-15 ENCOUNTER — Other Ambulatory Visit: Payer: Self-pay | Admitting: Neurology

## 2013-08-23 ENCOUNTER — Ambulatory Visit (INDEPENDENT_AMBULATORY_CARE_PROVIDER_SITE_OTHER): Payer: Medicare Other | Admitting: Pharmacist Clinician (PhC)/ Clinical Pharmacy Specialist

## 2013-08-23 DIAGNOSIS — Z5181 Encounter for therapeutic drug level monitoring: Secondary | ICD-10-CM

## 2013-08-23 DIAGNOSIS — I4891 Unspecified atrial fibrillation: Secondary | ICD-10-CM | POA: Diagnosis not present

## 2013-08-23 DIAGNOSIS — Q849 Congenital malformation of integument, unspecified: Secondary | ICD-10-CM | POA: Diagnosis not present

## 2013-08-23 DIAGNOSIS — Z7901 Long term (current) use of anticoagulants: Secondary | ICD-10-CM

## 2013-08-23 DIAGNOSIS — R609 Edema, unspecified: Secondary | ICD-10-CM | POA: Diagnosis not present

## 2013-08-23 LAB — POCT INR: INR: 2.5

## 2013-08-26 DIAGNOSIS — L819 Disorder of pigmentation, unspecified: Secondary | ICD-10-CM | POA: Diagnosis not present

## 2013-08-26 DIAGNOSIS — B353 Tinea pedis: Secondary | ICD-10-CM | POA: Diagnosis not present

## 2013-08-26 DIAGNOSIS — I8310 Varicose veins of unspecified lower extremity with inflammation: Secondary | ICD-10-CM | POA: Diagnosis not present

## 2013-09-05 DIAGNOSIS — R351 Nocturia: Secondary | ICD-10-CM | POA: Diagnosis not present

## 2013-09-05 DIAGNOSIS — N401 Enlarged prostate with lower urinary tract symptoms: Secondary | ICD-10-CM | POA: Diagnosis not present

## 2013-09-05 DIAGNOSIS — N138 Other obstructive and reflux uropathy: Secondary | ICD-10-CM | POA: Diagnosis not present

## 2013-09-13 ENCOUNTER — Ambulatory Visit (INDEPENDENT_AMBULATORY_CARE_PROVIDER_SITE_OTHER): Payer: Medicare Other | Admitting: Pharmacist

## 2013-09-13 DIAGNOSIS — Z7901 Long term (current) use of anticoagulants: Secondary | ICD-10-CM | POA: Diagnosis not present

## 2013-09-13 DIAGNOSIS — Z5181 Encounter for therapeutic drug level monitoring: Secondary | ICD-10-CM

## 2013-09-13 DIAGNOSIS — I4891 Unspecified atrial fibrillation: Secondary | ICD-10-CM

## 2013-09-13 LAB — POCT INR: INR: 3.3

## 2013-09-22 DIAGNOSIS — B353 Tinea pedis: Secondary | ICD-10-CM | POA: Diagnosis not present

## 2013-09-26 ENCOUNTER — Other Ambulatory Visit: Payer: Self-pay | Admitting: Cardiology

## 2013-09-26 ENCOUNTER — Telehealth: Payer: Self-pay | Admitting: Neurology

## 2013-09-26 NOTE — Telephone Encounter (Signed)
I called pt and last seen March 2015.  Has been having increased tremors, and sinemet, gabapentin not working like it did. Made RV this Thursday 09-29-13 at 0930.

## 2013-09-26 NOTE — Telephone Encounter (Signed)
Patient states medications Gabapentin and Carb-Levo does not seem to be working any more--starting having tremors over the weekend--please call.

## 2013-09-27 ENCOUNTER — Telehealth: Payer: Self-pay | Admitting: Neurology

## 2013-09-27 NOTE — Telephone Encounter (Signed)
error 

## 2013-09-29 ENCOUNTER — Encounter (INDEPENDENT_AMBULATORY_CARE_PROVIDER_SITE_OTHER): Payer: Self-pay

## 2013-09-29 ENCOUNTER — Encounter: Payer: Self-pay | Admitting: Neurology

## 2013-09-29 ENCOUNTER — Ambulatory Visit (INDEPENDENT_AMBULATORY_CARE_PROVIDER_SITE_OTHER): Payer: Medicare Other | Admitting: Neurology

## 2013-09-29 VITALS — BP 155/88 | HR 92 | Temp 97.4°F | Ht 73.0 in | Wt 265.0 lb

## 2013-09-29 DIAGNOSIS — R0989 Other specified symptoms and signs involving the circulatory and respiratory systems: Secondary | ICD-10-CM | POA: Diagnosis not present

## 2013-09-29 DIAGNOSIS — R209 Unspecified disturbances of skin sensation: Secondary | ICD-10-CM

## 2013-09-29 DIAGNOSIS — G4710 Hypersomnia, unspecified: Secondary | ICD-10-CM

## 2013-09-29 DIAGNOSIS — G4752 REM sleep behavior disorder: Secondary | ICD-10-CM

## 2013-09-29 DIAGNOSIS — R202 Paresthesia of skin: Secondary | ICD-10-CM

## 2013-09-29 DIAGNOSIS — R400 Somnolence: Secondary | ICD-10-CM

## 2013-09-29 DIAGNOSIS — G20 Parkinson's disease: Secondary | ICD-10-CM

## 2013-09-29 DIAGNOSIS — R0609 Other forms of dyspnea: Secondary | ICD-10-CM | POA: Diagnosis not present

## 2013-09-29 DIAGNOSIS — R351 Nocturia: Secondary | ICD-10-CM

## 2013-09-29 DIAGNOSIS — R0683 Snoring: Secondary | ICD-10-CM

## 2013-09-29 DIAGNOSIS — I4891 Unspecified atrial fibrillation: Secondary | ICD-10-CM | POA: Diagnosis not present

## 2013-09-29 DIAGNOSIS — G20A1 Parkinson's disease without dyskinesia, without mention of fluctuations: Secondary | ICD-10-CM

## 2013-09-29 DIAGNOSIS — F411 Generalized anxiety disorder: Secondary | ICD-10-CM

## 2013-09-29 MED ORDER — CARBIDOPA-LEVODOPA ER 50-200 MG PO TBCR: 1.0000 | EXTENDED_RELEASE_TABLET | Freq: Every day | ORAL | Status: DC

## 2013-09-29 MED ORDER — ESCITALOPRAM OXALATE 5 MG PO TABS: 5.0000 mg | ORAL_TABLET | Freq: Every day | ORAL | Status: DC

## 2013-09-29 NOTE — Progress Notes (Signed)
Subjective:    Patient ID: Brian [name] is a 73 y.o. male.  HPI    Interim history:   Brian [name] is a 73-year-old right-handed gentleman with an underlying medical history of atrial fibrillation, hypertension, OSA, and hyperlipidemia who presents for followup consultation of his right-sided predominant Parkinson's disease. He is accompanied by his wife today. He presents for a sooner than scheduled appointment due to increase in tremors and paresthesias reported. I last saw him on 05/31/2013, at which time I felt he was fairly stable. I kept him on Sinemet 4 times a day and gabapentin 100 mg 3 times a day. He reported, going up to 4 times a day with his Sinemet helped. He reported no side effects with gabapentin or Sinemet and seemed to tolerate them well. He had some residual facial tingling which was tolerable to him. He has LBP and went to [facility]. He had an Xray of the back and was told there was degenerative disease and was given and was given a 3 week taper of oral steroids, which helped. He had stopped exercising d/t back pain and was scheduled for PT evaluation through ortho. He has been on metamucil for constipation which helped.   Today, he reports having noted an increase in his R hand tremor and he has been more anxious, but not depressed, but his wife feels he is somewhat depressed. He sleeps during the day a lot, has some yelling in his sleep at night, does not sleep well at night and snores loudly. He had a sleep study over 5 years ago, and that was negative for OSA. He has nocturia x 2-3 times per night and sees a urologist and has started Myrbetriq a week or so ago and tried something else before. He still has intermittent paresthesias particularly in his left face and left arm but nothing that bothers him. He has been taking gabapentin 3 times a day, and 7, 11 and 3. He takes Sinemet regularly at 7, 11, 3, and 7. He has noted an intermittent head tremor. He has otherwise no  significant motor complications.  I saw him on 11/30/2012, at which time I suggested that he continue gabapentin 100 mg 1 pill 3 times a day for his paresthesias and encouraged him to continue Sinemet with 25/100 mg strength one tablet 4 times a day, at 7, 11, 3 PM and 7 PM.  I first met him on 06/22/2012 and he presented on 08/23/2012 for a sooner than scheduled appointment because he felt his medication was not helpful. He previously followed with Dr. James love and had been complaining of paresthesias. Dr. Love had started him on low-dose gabapentin. For his parkinsonism which was first noted in June of 2012 he was tried on pramipexole which helped, but at his first visit with me he told me that his gabapentin was not helpful and he also discontinued pramipexole a few months prior because of swelling in his feet and ankles. This improved after stopping both medications. At the time of his first visit with me I felt he had mild parkinsonism without much in the way of lateralization. I started him on gabapentin again because of his paresthesias and also asked him to start low-dose Sinemet. He called back stating that the Sinemet was not helpful. He requested a sooner appointment. He had been continuing taking gabapentin 100 mg 3 times a day and Sinemet one pill 3 times a day with minimal improvement as he reported last time. I suggested   at that visit that he increase his Sinemet to one pill 4 times a day and continue with gabapentin 100 mg 3 times a day. I also felt that he may have right sided predominant idiopathic Parkinson's disease due to an intermittent tremor noted only on the right side.    His Past Medical History Is Significant For: Past Medical History  Diagnosis Date  . Dyslipidemia   . Mitral regurgitation     Mild  . Abnormal PFT 1. 05/18/08  2. 11/30/08    1. Showed mild airflow obstruction, mild restriction, mild diffusion defect; FEV1 2.22(64%), FVC 3.33(65%), FEVi% 67, TLC 5.19(69%),  DLCO 77%, +BD  2. FEV1 2.38(73%), FVC 3.81(80%), FEV1% 63, TLC 5.61(80%), DLCO 79%, no BD  . Parkinsonism 06/22/2012  . Paresthesias 06/22/2012  . Hyperlipidemia   . GERD (gastroesophageal reflux disease)   . Erectile dysfunction   . BPH (benign prostatic hyperplasia)   . Restrictive lung disease   . CKD (chronic kidney disease) stage 2, GFR 60-89 ml/min   . Cellulitis     right leg MRSA  . Osteoarthritis   . Allergic rhinitis   . Anxiety   . Hypertension   . Atrial fibrillation     persistent afib    His Past Surgical History Is Significant For: Past Surgical History  Procedure Laterality Date  . Cataract extraction    . Cardioversion      multiple  . Cardiac catheterization  11/2009    normal coronary arteries    His Family History Is Significant For: Family History  Problem Relation Age of Onset  . Diabetes Mother     DM  . Stroke Father     CVA  . Pancreatic cancer Brother   . Pancreatic cancer Other     Nephew    His Social History Is Significant For: History   Social History  . Marital Status: Married    Spouse Name: N/A    Number of Children: 4  . Years of Education: N/A   Occupational History  . Benefit analyst     Worked with [employer]   Social History Main Topics  . Smoking status: Never Smoker   . Smokeless tobacco: Never Used  . Alcohol Use: No     Comment: occasionally  . Drug Use: No  . Sexual Activity: None   Other Topics Concern  . None   Social History Narrative   Married and lives in [place].  Retired accountant.    His Allergies Are:  Allergies  Allergen Reactions  . Lisinopril Other (See Comments)    coughing  :   His Current Medications Are:  Outpatient Encounter Prescriptions as of 09/29/2013  Medication Sig  . amLODipine (NORVASC) 10 MG tablet Take 1 tablet (10 mg total) by mouth daily.  . carbidopa-levodopa (SINEMET IR) 25-100 MG per tablet Take 1 tablet by mouth 4 (four) times daily. At 7am, 11am, 3pm and 7pm  .  Cholecalciferol (VITAMIN D3) 1000 UNITS CAPS Take by mouth daily.    . doxazosin (CARDURA) 2 MG tablet TAKE THREE TABLETS BY MOUTH DAILY  . famotidine (PEPCID) 40 MG tablet Take 40 mg by mouth daily.   . furosemide (LASIX) 20 MG tablet Take 20 mg by mouth as needed.   . gabapentin (NEURONTIN) 100 MG capsule Take 1 capsule (100 mg total) by mouth 3 (three) times daily.  . ketoconazole (NIZORAL) 2 % cream Apply 2 application topically 2 (two) times a week.  . KLOR-CON M10 10   MEQ tablet Take 1 tablet by mouth daily.  . Multiple Vitamins-Minerals (CENTRUM SILVER PO) Take by mouth daily.    . MYRBETRIQ 25 MG TB24 tablet Take 1 tablet by mouth daily with breakfast.  . nebivolol (BYSTOLIC) 5 MG tablet Take 10 mg by mouth daily.   . olmesartan (BENICAR) 40 MG tablet Take 1 tablet (40 mg total) by mouth daily.  . pravastatin (PRAVACHOL) 40 MG tablet Take 40 mg by mouth daily.   . warfarin (COUMADIN) 5 MG tablet Take 5 mg by mouth as directed.    :  Review of Systems:  Out of a complete 14 point review of systems, all are reviewed and negative with the exception of these symptoms as listed below:   Review of Systems  Constitutional: Negative.   HENT: Negative.   Eyes: Negative.   Respiratory: Negative.   Cardiovascular: Negative.   Gastrointestinal: Negative.   Endocrine: Negative.   Genitourinary: Positive for frequency.  Musculoskeletal: Negative.   Skin: Negative.   Allergic/Immunologic: Negative.   Neurological: Positive for tremors and speech difficulty.  Hematological: Negative.   Psychiatric/Behavioral: Positive for sleep disturbance (eds, snoring, sleep talking).    Objective:  Neurologic Exam  Physical Exam Physical Examination:   Filed Vitals:   09/29/13 0908  BP: 155/88  Pulse: 92  Temp: 97.4 F (36.3 C)   General Examination: The patient is a very pleasant 73 y.o. male in no acute distress. He appears well-developed and well-nourished and well groomed. He is  overweight. He is mildly anxious appearing.  HEENT: Normocephalic, atraumatic, pupils are equal, round and reactive to light and accommodation. Extraocular tracking shows mild saccadic breakdown without nystagmus noted. There is no limitation to gaze. There is no apraxia of eyelid opening. There is mild decrease in eye blink rate. Hearing is intact. Face is symmetric with moderate facial masking and normal facial sensation. There is an intermittent lip and head tremor. This seems to be relatively new to me. Neck is moderately rigid with intact passive ROM. There are no carotid bruits on auscultation. Oropharynx exam reveals no mouth dryness. No significant airway crowding is noted, tonsils are absent, but he does have a floppy appearing soft palate and his uvula reaches far down . Mallampati is class II. Tongue protrudes centrally and palate elevates symmetrically.    Chest: is clear to auscultation without wheezing, rhonchi or crackles noted.  Heart: sounds are irregularly irregular without murmurs, rubs or gallops noted.   Abdomen: is soft, non-tender and non-distended with normal bowel sounds appreciated on auscultation.  Extremities: There is trace edema in the ankles bilaterally, not worse.   Skin: is warm and dry with no trophic changes noted.   Musculoskeletal: exam reveals no obvious joint deformities, tenderness or joint swelling or erythema.  Neurologically:  Mental status: The patient is awake and alert, paying good attention. He is able to to provide a good history. He is oriented to: person, place, time/date, situation, day of week, month of year and year. His memory, attention, language and knowledge are intact. There is no aphasia, agnosia, apraxia or anomia. There is a mild degree of bradyphrenia. Speech is mildly hypophonic with no dysarthria noted. Mood is congruent and affect is normal.  Cranial nerves are as described above under HEENT exam. In addition, shoulder shrug is normal  with equal shoulder height noted.  Motor exam: Normal bulk, and strength for age is noted. Tone is mildly rigid with absence of cogwheeling in the bilateral extremities. Tone is increased   on the R>L. There is an intermittent right upper extremity tremor at rest. There is overall mild bradykinesia. There is no drift or rebound. Romberg is negative. Reflexes are 1+ in the upper extremities and 1+ in the lower extremities. Fine motor skills exam reveals: Finger taps are moderately impaired on the right and mildly impaired on the left. Hand movements are mildly impaired on the right and mildly impaired on the left. RAP (rapid alternating patting) is mildly impaired on the right and mildly impaired on the left. Foot taps are moderately to severely impaired on the right and moderately impaired on the left. Foot agility (in the form of heel stomping) is mildly impaired on the right and mildly impaired on the left.    Cerebellar testing shows no dysmetria or intention tremor on finger to nose testing. There is no truncal or gait ataxia.   Sensory exam is intact to light touch.   Gait, station and balance exam: He stands up from the seated position with mild significant difficulty. No veering to one side is noted. He is not noted to lean to one side. Posture is moderately stooped. Stance is  slightly wide based. He walks with decrease in stride length and pace and decreased arm swing on both sides, R more than L. He turns in 3 steps. Tandem walk is not possible. Balance is mildly impaired.   Assessment and Plan:   In summary, Brian [name] is a very pleasant 73-year old male with a history of atrial fibrillation, hypertension, hyperlipidemia, who presents for followup consultation of his right-sided predominant Parkinson's disease, most likely akinetic-rigid type. He has  had some progression in the last 4 months including tremor in the right upper extremity, lip and head tremor. His history suggestive of sleep  disturbance including REM behavior disorder and loud snoring per wife. He had a sleep study several years ago which was apparently negative for sleep apnea, however given his nocturia, his sleep disturbance at night, his daytime somnolence and loud snoring reported I would like to proceed with a sleep study. We talked at length about his issues today. I think there are several contributors to how he's feeling. I think there is a degree of anxiety and perhaps depression that we should address at this time. I would like to start him off on low-dose Lexapro. We talked about potential side effects including suicidal ideations in flareup and depressive symptoms in the beginning. We will start with 5 mg once daily. Furthermore, I would like to change the timing of his gabapentin to one pill at 7 AM, one pill at 11 AM, and one pill at 7 PM. He is going to continue with Sinemet 25-100 mg strength one pill 4 times a day at 7, 11, 3, and 7 PM. However, I would like to add a long-acting Sinemet at bedtime which will be Sinemet CR 50/200 mg strength one pill each bedtime. He reports tremors wake him up at night. This will hopefully carry him a little bit better overnight. We may consider clonazepam at night to help his sleep and reduce his dream activity down the Road. We may address this after he's had a sleep study. He is currently trying Myrbetric for his bladder hyperactivity . I encouraged the patient to eat healthy, exercise daily and keep well hydrated, to keep a scheduled bedtime and wake time routine, to not skip any meals and eat healthy snacks in between meals and to have protein with every meal. I would like   for him to  pick up his exercise regimen again. I answered all their questions today and the patient and his wife were in agreement. He can keep his appointment in September with me at which time we will talk about his sleep test results as well. They are encouraged to call with any interim questions, concerns,  problems or updates and refill requests.  Most of my 40 minute visit today was spent in counseling and coordination of care, and reviewing medication.  

## 2013-09-29 NOTE — Patient Instructions (Addendum)
1. We will continue with sinemet 1 pill 4 times a day.  2. We will add a bedtime dose of sinemet CR, 1 pill at bedtime to help the trembling in the middle of the night.  3. I will change the timing of your gabapentin to 7 AM, 11 AM and 7 PM.  4. We will do a sleep study to make sure you don't have sleep apnea.  5. We will start a small dose of an antidepressant, namely Lexapro (generic) 5 mg strength, take one pill each morning. Side effects may include flare up in depression initially, otherwise some people can have drowsiness, confusion, or dizziness. Rarely, personality changes.  6. See you in September!

## 2013-10-04 ENCOUNTER — Ambulatory Visit (INDEPENDENT_AMBULATORY_CARE_PROVIDER_SITE_OTHER): Payer: Medicare Other | Admitting: *Deleted

## 2013-10-04 DIAGNOSIS — Z5181 Encounter for therapeutic drug level monitoring: Secondary | ICD-10-CM

## 2013-10-04 DIAGNOSIS — I4891 Unspecified atrial fibrillation: Secondary | ICD-10-CM

## 2013-10-04 DIAGNOSIS — Z7901 Long term (current) use of anticoagulants: Secondary | ICD-10-CM

## 2013-10-04 LAB — POCT INR: INR: 2.3

## 2013-10-28 DIAGNOSIS — I10 Essential (primary) hypertension: Secondary | ICD-10-CM | POA: Diagnosis not present

## 2013-10-28 DIAGNOSIS — E782 Mixed hyperlipidemia: Secondary | ICD-10-CM | POA: Diagnosis not present

## 2013-10-28 DIAGNOSIS — I4891 Unspecified atrial fibrillation: Secondary | ICD-10-CM | POA: Diagnosis not present

## 2013-10-28 DIAGNOSIS — G20 Parkinson's disease: Secondary | ICD-10-CM | POA: Diagnosis not present

## 2013-11-01 ENCOUNTER — Ambulatory Visit (INDEPENDENT_AMBULATORY_CARE_PROVIDER_SITE_OTHER): Payer: Medicare Other | Admitting: *Deleted

## 2013-11-01 ENCOUNTER — Other Ambulatory Visit: Payer: Self-pay | Admitting: Pharmacist Clinician (PhC)/ Clinical Pharmacy Specialist

## 2013-11-01 DIAGNOSIS — I4891 Unspecified atrial fibrillation: Secondary | ICD-10-CM | POA: Diagnosis not present

## 2013-11-01 DIAGNOSIS — Z5181 Encounter for therapeutic drug level monitoring: Secondary | ICD-10-CM | POA: Diagnosis not present

## 2013-11-01 DIAGNOSIS — Z7901 Long term (current) use of anticoagulants: Secondary | ICD-10-CM | POA: Diagnosis not present

## 2013-11-01 LAB — POCT INR: INR: 3.2

## 2013-11-01 MED ORDER — WARFARIN SODIUM 5 MG PO TABS: ORAL_TABLET | ORAL | Status: DC

## 2013-11-02 DIAGNOSIS — R609 Edema, unspecified: Secondary | ICD-10-CM | POA: Diagnosis not present

## 2013-11-09 ENCOUNTER — Ambulatory Visit: Payer: Medicare Other | Admitting: Neurology

## 2013-11-09 DIAGNOSIS — G4761 Periodic limb movement disorder: Secondary | ICD-10-CM | POA: Diagnosis not present

## 2013-11-09 DIAGNOSIS — R0609 Other forms of dyspnea: Secondary | ICD-10-CM | POA: Diagnosis not present

## 2013-11-09 DIAGNOSIS — R0989 Other specified symptoms and signs involving the circulatory and respiratory systems: Secondary | ICD-10-CM

## 2013-11-22 ENCOUNTER — Ambulatory Visit (INDEPENDENT_AMBULATORY_CARE_PROVIDER_SITE_OTHER): Payer: Medicare Other | Admitting: Pharmacist

## 2013-11-22 DIAGNOSIS — Z5181 Encounter for therapeutic drug level monitoring: Secondary | ICD-10-CM

## 2013-11-22 DIAGNOSIS — I4891 Unspecified atrial fibrillation: Secondary | ICD-10-CM

## 2013-11-22 DIAGNOSIS — Z7901 Long term (current) use of anticoagulants: Secondary | ICD-10-CM

## 2013-11-22 LAB — POCT INR: INR: 3

## 2013-11-25 ENCOUNTER — Other Ambulatory Visit: Payer: Self-pay | Admitting: *Deleted

## 2013-11-25 ENCOUNTER — Telehealth: Payer: Self-pay | Admitting: Neurology

## 2013-11-25 MED ORDER — DOXAZOSIN MESYLATE 2 MG PO TABS: ORAL_TABLET | ORAL | Status: DC

## 2013-11-25 NOTE — Telephone Encounter (Signed)
Please advise patient or his wife, that His recent sleep study did not show any significant obstructive sleep apnea. He does snore and had some sleep disturbance including moving excessively in sleep which we expect to see given his History of Parkinson's disease. We can go for details during our already scheduled followup appointment on 12/01/13, and we can mail report to them in the interim.

## 2013-11-30 ENCOUNTER — Other Ambulatory Visit: Payer: Self-pay | Admitting: *Deleted

## 2013-11-30 DIAGNOSIS — R400 Somnolence: Secondary | ICD-10-CM

## 2013-11-30 DIAGNOSIS — G20 Parkinson's disease: Secondary | ICD-10-CM

## 2013-11-30 DIAGNOSIS — R0683 Snoring: Secondary | ICD-10-CM

## 2013-11-30 DIAGNOSIS — R351 Nocturia: Secondary | ICD-10-CM

## 2013-11-30 DIAGNOSIS — G4752 REM sleep behavior disorder: Secondary | ICD-10-CM

## 2013-11-30 NOTE — Telephone Encounter (Signed)
Sleep study results have been scanned into the patient's chart.  He has been scheduled for a follow up appointment with Dr. Rexene Alberts to review the results in detail.  The patient will be provided with his sleep study results to keep for his records upon arrival.

## 2013-12-01 ENCOUNTER — Encounter (INDEPENDENT_AMBULATORY_CARE_PROVIDER_SITE_OTHER): Payer: Self-pay

## 2013-12-01 ENCOUNTER — Ambulatory Visit (INDEPENDENT_AMBULATORY_CARE_PROVIDER_SITE_OTHER): Payer: Medicare Other | Admitting: Neurology

## 2013-12-01 ENCOUNTER — Encounter: Payer: Self-pay | Admitting: Neurology

## 2013-12-01 VITALS — BP 141/82 | HR 69 | Temp 98.7°F | Ht 72.0 in | Wt 258.0 lb

## 2013-12-01 DIAGNOSIS — R0989 Other specified symptoms and signs involving the circulatory and respiratory systems: Secondary | ICD-10-CM | POA: Diagnosis not present

## 2013-12-01 DIAGNOSIS — F3289 Other specified depressive episodes: Secondary | ICD-10-CM

## 2013-12-01 DIAGNOSIS — R0609 Other forms of dyspnea: Secondary | ICD-10-CM

## 2013-12-01 DIAGNOSIS — I4891 Unspecified atrial fibrillation: Secondary | ICD-10-CM | POA: Diagnosis not present

## 2013-12-01 DIAGNOSIS — F32A Depression, unspecified: Secondary | ICD-10-CM

## 2013-12-01 DIAGNOSIS — R351 Nocturia: Secondary | ICD-10-CM

## 2013-12-01 DIAGNOSIS — G20 Parkinson's disease: Secondary | ICD-10-CM | POA: Diagnosis not present

## 2013-12-01 DIAGNOSIS — G4752 REM sleep behavior disorder: Secondary | ICD-10-CM

## 2013-12-01 DIAGNOSIS — F329 Major depressive disorder, single episode, unspecified: Secondary | ICD-10-CM

## 2013-12-01 DIAGNOSIS — F411 Generalized anxiety disorder: Secondary | ICD-10-CM

## 2013-12-01 DIAGNOSIS — R0683 Snoring: Secondary | ICD-10-CM

## 2013-12-01 MED ORDER — CLONAZEPAM 0.25 MG PO TBDP: 0.2500 mg | ORAL_TABLET | Freq: Every day | ORAL | Status: DC

## 2013-12-01 NOTE — Progress Notes (Signed)
Subjective:    Patient ID: Brian [name] is a 73 y.o. male.  HPI    Interim history:   Brian [name] is a 73 year old right-handed gentleman with an underlying medical history of atrial fibrillation, hypertension, OSA, and hyperlipidemia who presents for followup consultation of his right-sided predominant Parkinson's disease. He is accompanied by his wife again today. I last saw him on 09/29/2013 at which time he presented for a sooner than scheduled appointment because of increasing tremors and parkinsonian symptoms. He also felt more anxious but not frankly depressed. His wife felt that he may have been depressed. She reported dream enactments. She also reported loud snoring. He had a sleep study over 5 years ago, and that was negative for OSA at the time. He had started Myrbetriq recently. He has been taking gabapentin 3 times a day, and 7, 11 and 3. He takes Sinemet regularly at 7, 11, 3, and 7. I suggested that he start low-dose Lexapro at 5 mg strength. I changed the timing of his gabapentin to one pill at 7 AM, 1 pill at 11 AM and one pill at 7 PM. I asked him to continue with Sinemet 4 times a day. I asked him to add a long-acting Sinemet CR 50/200 mg strength once daily at bed time. I suggested he return for a sleep study. He had a diagnostic polysomnogram on 11/09/2013 and I went over his test results with him in detail today. Sleep efficiency was reduced at 58.7% with a latency to sleep of 82 minutes and wake after sleep onset of 117 minutes with mild to moderate sleep fragmentation noted. He had an increased percentage of light stage sleep, absence of deep sleep and 16.8% of dream sleep with a prolonged REM latency. He had mild to moderate snoring. Total AHI was 3.2 per hour, he had some lack of REM atonia. Today, he reports, that he stopped the Myrbetric due to new onset facial swelling, but this was no better, after stopping it for the last 3 weeks. He is still on the lexapro, but his facial  swelling may have actually started after the lexapro. He denies itching, no rash and no swelling elsewhere. He has added to long-acting Sinemet at that time and actually sleeps a little bit longer up to 3 AM.    I saw him on 05/31/2013, at which time I felt he was fairly stable. I kept him on Sinemet 4 times a day and gabapentin 100 mg 3 times a day. He reported, going up to 4 times a day with his Sinemet helped. He reported no side effects with gabapentin or Sinemet and seemed to tolerate them well. He had some residual facial tingling which was tolerable to him. He has LBP and went to [facility]. He had an Xray of the back and was told there was degenerative disease and was given and was given a 3 week taper of oral steroids, which helped. He had stopped exercising d/t back pain and was scheduled for PT evaluation through ortho. He has been on metamucil for constipation which helped.  I saw him on 11/30/2012, at which time I suggested that he continue gabapentin 100 mg 1 pill 3 times a day for his paresthesias and encouraged him to continue Sinemet with 25/100 mg strength one tablet 4 times a day, at 7, 11, 3 PM and 7 PM.  I first met him on 06/22/2012 and he presented on 08/23/2012 for a sooner than scheduled appointment because he felt his  medication was not helpful. He previously followed with Dr. Jeneen Rinks love and had been complaining of paresthesias. Dr. Erling Cruz had started him on low-dose gabapentin. For his parkinsonism which was first noted in June of 2012 he was tried on pramipexole which helped, but at his first visit with me he told me that his gabapentin was not helpful and he also discontinued pramipexole a few months prior because of swelling in his feet and ankles. This improved after stopping both medications. At the time of his first visit with me I felt he had mild parkinsonism without much in the way of lateralization. I started him on gabapentin again because of his paresthesias and also  asked him to start low-dose Sinemet. He called back stating that the Sinemet was not helpful. He requested a sooner appointment. He had been continuing taking gabapentin 100 mg 3 times a day and Sinemet one pill 3 times a day with minimal improvement as he reported last time. I suggested at that visit that he increase his Sinemet to one pill 4 times a day and continue with gabapentin 100 mg 3 times a day. I also felt that he may have right sided predominant idiopathic Parkinson's disease due to an intermittent tremor noted only on the right side.   His Past Medical History Is Significant For: Past Medical History  Diagnosis Date  . Dyslipidemia   . Mitral regurgitation     Mild  . Abnormal PFT 1. 05/18/08  2. 11/30/08    1. Showed mild airflow obstruction, mild restriction, mild diffusion defect; FEV1 2.22(64%), FVC 3.33(65%), FEVi% 67, TLC 5.19(69%), DLCO 77%, +BD  2. FEV1 2.38(73%), FVC 3.81(80%), FEV1% 63, TLC 5.61(80%), DLCO 79%, no BD  . Parkinsonism 06/22/2012  . Paresthesias 06/22/2012  . Hyperlipidemia   . GERD (gastroesophageal reflux disease)   . Erectile dysfunction   . BPH (benign prostatic hyperplasia)   . Restrictive lung disease   . CKD (chronic kidney disease) stage 2, GFR 60-89 ml/min   . Cellulitis     right leg MRSA  . Osteoarthritis   . Allergic rhinitis   . Anxiety   . Hypertension   . Atrial fibrillation     persistent afib    His Past Surgical History Is Significant For: Past Surgical History  Procedure Laterality Date  . Cataract extraction    . Cardioversion      multiple  . Cardiac catheterization  11/2009    normal coronary arteries    His Family History Is Significant For: Family History  Problem Relation Age of Onset  . Diabetes Mother     DM  . Stroke Father     CVA  . Pancreatic cancer Brother   . Pancreatic cancer Other     Nephew    His Social History Is Significant For: History   Social History  . Marital Status: Married    Spouse Name:  [name]    Number of Children: 4  . Years of Education: masters   Occupational History  . Benefit analyst     Worked with [employer]   Social History Main Topics  . Smoking status: Never Smoker   . Smokeless tobacco: Never Used  . Alcohol Use: No     Comment: occasionally  . Drug Use: No  . Sexual Activity: None   Other Topics Concern  . None   Social History Narrative   Married and lives in [place].  Retired Optometrist.    His Allergies Are:  Allergies  Allergen Reactions  . Lisinopril Other (See Comments)    coughing  :   His Current Medications Are:  Outpatient Encounter Prescriptions as of 12/01/2013  Medication Sig  . amLODipine (NORVASC) 10 MG tablet Take 1 tablet (10 mg total) by mouth daily.  . carbidopa-levodopa (SINEMET CR) 50-200 MG per tablet Take 1 tablet by mouth at bedtime.  . carbidopa-levodopa (SINEMET IR) 25-100 MG per tablet Take 1 tablet by mouth 4 (four) times daily. At 7am, 11am, 3pm and 7pm  . Cholecalciferol (VITAMIN D3) 1000 UNITS CAPS Take by mouth daily.    Marland Kitchen doxazosin (CARDURA) 2 MG tablet TAKE THREE TABLETS BY MOUTH DAILY  . escitalopram (LEXAPRO) 5 MG tablet Take 1 tablet (5 mg total) by mouth daily.  . famotidine (PEPCID) 40 MG tablet Take 40 mg by mouth daily.   . furosemide (LASIX) 20 MG tablet Take 20 mg by mouth as needed.   . gabapentin (NEURONTIN) 100 MG capsule Take 1 capsule (100 mg total) by mouth 3 (three) times daily.  Marland Kitchen ketoconazole (NIZORAL) 2 % cream Apply 2 application topically 2 (two) times a week.  Marland Kitchen KLOR-CON M10 10 MEQ tablet Take 1 tablet by mouth daily.  . Multiple Vitamins-Minerals (CENTRUM SILVER PO) Take by mouth daily.    . nebivolol (BYSTOLIC) 5 MG tablet Take 10 mg by mouth daily.   Marland Kitchen olmesartan (BENICAR) 40 MG tablet Take 1 tablet (40 mg total) by mouth daily.  . pravastatin (PRAVACHOL) 40 MG tablet Take 40 mg by mouth daily.   Marland Kitchen warfarin (COUMADIN) 5 MG tablet Take 1 to 1.5 tablets by mouth daily as  directed by coumadin clinic  . [DISCONTINUED] MYRBETRIQ 25 MG TB24 tablet Take 1 tablet by mouth daily with breakfast.  :  Review of Systems:  Out of a complete 14 point review of systems, all are reviewed and negative with the exception of these symptoms as listed below:   Review of Systems  HENT: Positive for facial swelling.   Genitourinary:       Frequency of urination    Objective:  Neurologic Exam  Physical Exam Physical Examination:   Filed Vitals:   12/01/13 1458  BP: 141/82  Pulse: 69  Temp: 98.7 F (37.1 C)   General Examination: The patient is a very pleasant 73 y.o. male in no acute distress. He appears well-developed and well-nourished and well groomed. He is overweight. He is mildly anxious appearing.  HEENT: Normocephalic, atraumatic, pupils are equal, round and reactive to light and accommodation. He has very mild facial puffiness especially under his right eye. He has no rash. He has no signs of urticaria. Extraocular tracking shows mild saccadic breakdown without nystagmus noted. There is no limitation to gaze. There is no apraxia of eyelid opening. He has bilateral cataract repairs. There is mild decrease in eye blink rate. Hearing is intact. Face is symmetric with moderate facial masking and normal facial sensation. There is an intermittent lip and head tremor. This seems to be relatively new to me. Neck is moderately rigid with intact passive ROM. There are no carotid bruits on auscultation. Oropharynx exam reveals no mouth dryness. No significant airway crowding is noted, tonsils are absent, but he does have a floppy appearing soft palate and his uvula reaches far down . Mallampati is class II. Tongue protrudes centrally and palate elevates symmetrically.    Chest: is clear to auscultation without wheezing, rhonchi or crackles noted.  Heart: sounds are irregularly irregular without murmurs, rubs or gallops  noted.   Abdomen: is soft, non-tender and non-distended  with normal bowel sounds appreciated on auscultation.  Extremities: There is trace edema in the ankles bilaterally, not worse.   Skin: is warm and dry with no trophic changes noted.   Musculoskeletal: exam reveals no obvious joint deformities, tenderness or joint swelling or erythema.  Neurologically:  Mental status: The patient is awake and alert, paying good attention. He is able to to provide a good history. He is oriented to: person, place, time/date, situation, day of week, month of year and year. His memory, attention, language and knowledge are intact. There is no aphasia, agnosia, apraxia or anomia. There is a mild degree of bradyphrenia. Speech is mildly hypophonic with no dysarthria noted. Mood is congruent and affect is normal.  Cranial nerves are as described above under HEENT exam. In addition, shoulder shrug is normal with equal shoulder height noted.  Motor exam: Normal bulk, and strength for age is noted. Tone is mildly rigid with absence of cogwheeling in the bilateral extremities. Tone is increased on the R>L. There is an intermittent right upper extremity tremor at rest. There is overall mild bradykinesia. There is no drift or rebound. Romberg is negative. Reflexes are 1+ in the upper extremities and 1+ in the lower extremities. Fine motor skills exam reveals: Finger taps are moderately impaired on the right and mildly impaired on the left. Hand movements are mildly impaired on the right and mildly impaired on the left. RAP (rapid alternating patting) is mildly impaired on the right and mildly impaired on the left. Foot taps are moderately to severely impaired on the right and moderately impaired on the left. Foot agility (in the form of heel stomping) is mildly impaired on the right and mildly impaired on the left.    Cerebellar testing shows no dysmetria or intention tremor on finger to nose testing. There is no truncal or gait ataxia.   Sensory exam is intact to light touch.    Gait, station and balance exam: He stands up from the seated position with mild difficulty. No veering to one side is noted. He is not noted to lean to one side. Posture is moderately stooped. Stance is  slightly wide based. He walks with decrease in stride length and pace and decreased arm swing on both sides, R more than L. He turns in 3 steps. Tandem walk is not possible. Balance is mildly impaired with mild insecurity with turns.   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 I would like for him to come off of Lexapro because of his facial swelling, even though I'm not completely sure he has actually had a reaction to it. He had stopped his bladder medication as well after noticing the facial puffiness and has an appointment with his urologist next week. He can also not afford his bladder medication and may want to start something else, but I suggested he discuss it with his urologist next week. I would like to keep the Sinemet and Sinemet CR the same. I asked him to call us back in about a month for an update as to how he's doing off of Lexapro with regards to his facial puffiness. I would like to consider starting him  on clonazepam low-dose for his RBD and his sleep maintenance issues. Down the road we will also consider restarting an antidepressant, maybe a different one. I suggested he make an appointment in 3 months for Jeani Hawking, nurse practitioner, and in 6 months for me. We talked about his sleep study results in detail today. I answered all their questions today and the patient and his wife were in agreement.

## 2013-12-01 NOTE — Patient Instructions (Addendum)
We will stop the Lexapro to see if your facial swelling improves, which may take days or weeks to go away.  We will continue everything else for now.  Your sleep study did not show any significant sleep apnea, but you do not sleep very well and you have evidence of REM behavior disorder.   Call in one month and report back regarding the facial swelling and we will consider adding Klonopin (generic: clonazepam): 0.25 mg, take one pill each bedtime. Common side effects include sedation or sleepiness, balance problem, personality changes. (hold prescription until ready to fill)  At some point, maybe next visit we will talk about restarting an antidepressant, maybe a different one.   Talk to your urologist about the facial swelling and Myrbetriq.

## 2013-12-02 DIAGNOSIS — Z961 Presence of intraocular lens: Secondary | ICD-10-CM | POA: Diagnosis not present

## 2013-12-02 DIAGNOSIS — H40019 Open angle with borderline findings, low risk, unspecified eye: Secondary | ICD-10-CM | POA: Diagnosis not present

## 2013-12-06 DIAGNOSIS — N401 Enlarged prostate with lower urinary tract symptoms: Secondary | ICD-10-CM | POA: Diagnosis not present

## 2013-12-16 DIAGNOSIS — M1710 Unilateral primary osteoarthritis, unspecified knee: Secondary | ICD-10-CM | POA: Diagnosis not present

## 2013-12-20 ENCOUNTER — Ambulatory Visit (INDEPENDENT_AMBULATORY_CARE_PROVIDER_SITE_OTHER): Payer: Medicare Other | Admitting: Pharmacist Clinician (PhC)/ Clinical Pharmacy Specialist

## 2013-12-20 VITALS — BP 126/80

## 2013-12-20 DIAGNOSIS — I4891 Unspecified atrial fibrillation: Secondary | ICD-10-CM

## 2013-12-20 DIAGNOSIS — Z7901 Long term (current) use of anticoagulants: Secondary | ICD-10-CM | POA: Diagnosis not present

## 2013-12-20 DIAGNOSIS — Z5181 Encounter for therapeutic drug level monitoring: Secondary | ICD-10-CM | POA: Diagnosis not present

## 2013-12-20 LAB — POCT INR: INR: 3.3

## 2013-12-23 ENCOUNTER — Other Ambulatory Visit: Payer: Self-pay | Admitting: *Deleted

## 2013-12-23 MED ORDER — DOXAZOSIN MESYLATE 2 MG PO TABS: ORAL_TABLET | ORAL | Status: DC

## 2014-01-02 DIAGNOSIS — M5136 Other intervertebral disc degeneration, lumbar region: Secondary | ICD-10-CM | POA: Diagnosis not present

## 2014-01-03 ENCOUNTER — Ambulatory Visit (INDEPENDENT_AMBULATORY_CARE_PROVIDER_SITE_OTHER): Payer: Medicare Other | Admitting: Pharmacist Clinician (PhC)/ Clinical Pharmacy Specialist

## 2014-01-03 VITALS — BP 136/88

## 2014-01-03 DIAGNOSIS — Z5181 Encounter for therapeutic drug level monitoring: Secondary | ICD-10-CM | POA: Diagnosis not present

## 2014-01-03 DIAGNOSIS — Z7901 Long term (current) use of anticoagulants: Secondary | ICD-10-CM | POA: Diagnosis not present

## 2014-01-03 DIAGNOSIS — I4891 Unspecified atrial fibrillation: Secondary | ICD-10-CM

## 2014-01-03 LAB — POCT INR: INR: 2.3

## 2014-01-07 ENCOUNTER — Other Ambulatory Visit: Payer: Self-pay | Admitting: Neurology

## 2014-01-12 DIAGNOSIS — M5136 Other intervertebral disc degeneration, lumbar region: Secondary | ICD-10-CM | POA: Diagnosis not present

## 2014-01-19 ENCOUNTER — Other Ambulatory Visit: Payer: Self-pay | Admitting: Cardiology

## 2014-01-19 ENCOUNTER — Other Ambulatory Visit: Payer: Self-pay | Admitting: Neurology

## 2014-01-24 ENCOUNTER — Ambulatory Visit (INDEPENDENT_AMBULATORY_CARE_PROVIDER_SITE_OTHER): Payer: Medicare Other | Admitting: Surgery

## 2014-01-24 DIAGNOSIS — Z7901 Long term (current) use of anticoagulants: Secondary | ICD-10-CM | POA: Diagnosis not present

## 2014-01-24 DIAGNOSIS — Z5181 Encounter for therapeutic drug level monitoring: Secondary | ICD-10-CM | POA: Diagnosis not present

## 2014-01-24 DIAGNOSIS — R609 Edema, unspecified: Secondary | ICD-10-CM | POA: Diagnosis not present

## 2014-01-24 DIAGNOSIS — M4806 Spinal stenosis, lumbar region: Secondary | ICD-10-CM | POA: Diagnosis not present

## 2014-01-24 DIAGNOSIS — M5136 Other intervertebral disc degeneration, lumbar region: Secondary | ICD-10-CM | POA: Diagnosis not present

## 2014-01-24 DIAGNOSIS — Z23 Encounter for immunization: Secondary | ICD-10-CM | POA: Diagnosis not present

## 2014-01-24 DIAGNOSIS — I4891 Unspecified atrial fibrillation: Secondary | ICD-10-CM | POA: Diagnosis not present

## 2014-01-24 LAB — POCT INR: INR: 2.3

## 2014-01-27 ENCOUNTER — Other Ambulatory Visit: Payer: Self-pay | Admitting: Cardiology

## 2014-01-28 ENCOUNTER — Other Ambulatory Visit: Payer: Self-pay | Admitting: Cardiology

## 2014-02-15 ENCOUNTER — Other Ambulatory Visit: Payer: Self-pay | Admitting: Neurology

## 2014-02-15 ENCOUNTER — Other Ambulatory Visit: Payer: Self-pay | Admitting: Cardiology

## 2014-02-15 DIAGNOSIS — M9901 Segmental and somatic dysfunction of cervical region: Secondary | ICD-10-CM | POA: Diagnosis not present

## 2014-02-15 DIAGNOSIS — M5022 Other cervical disc displacement, mid-cervical region: Secondary | ICD-10-CM | POA: Diagnosis not present

## 2014-02-20 DIAGNOSIS — M5022 Other cervical disc displacement, mid-cervical region: Secondary | ICD-10-CM | POA: Diagnosis not present

## 2014-02-20 DIAGNOSIS — M9901 Segmental and somatic dysfunction of cervical region: Secondary | ICD-10-CM | POA: Diagnosis not present

## 2014-02-22 DIAGNOSIS — M5022 Other cervical disc displacement, mid-cervical region: Secondary | ICD-10-CM | POA: Diagnosis not present

## 2014-02-22 DIAGNOSIS — M9901 Segmental and somatic dysfunction of cervical region: Secondary | ICD-10-CM | POA: Diagnosis not present

## 2014-02-23 DIAGNOSIS — M5022 Other cervical disc displacement, mid-cervical region: Secondary | ICD-10-CM | POA: Diagnosis not present

## 2014-02-23 DIAGNOSIS — M9901 Segmental and somatic dysfunction of cervical region: Secondary | ICD-10-CM | POA: Diagnosis not present

## 2014-02-25 ENCOUNTER — Other Ambulatory Visit: Payer: Self-pay | Admitting: Internal Medicine

## 2014-02-25 NOTE — Telephone Encounter (Signed)
Rx was sent to pharmacy electronically. OV 12/11 with Dr. Radford Pax

## 2014-02-28 ENCOUNTER — Ambulatory Visit (INDEPENDENT_AMBULATORY_CARE_PROVIDER_SITE_OTHER): Payer: Medicare Other

## 2014-02-28 DIAGNOSIS — Z5181 Encounter for therapeutic drug level monitoring: Secondary | ICD-10-CM | POA: Diagnosis not present

## 2014-02-28 DIAGNOSIS — Z7901 Long term (current) use of anticoagulants: Secondary | ICD-10-CM | POA: Diagnosis not present

## 2014-02-28 DIAGNOSIS — I4891 Unspecified atrial fibrillation: Secondary | ICD-10-CM | POA: Diagnosis not present

## 2014-02-28 DIAGNOSIS — M9901 Segmental and somatic dysfunction of cervical region: Secondary | ICD-10-CM | POA: Diagnosis not present

## 2014-02-28 DIAGNOSIS — M5022 Other cervical disc displacement, mid-cervical region: Secondary | ICD-10-CM | POA: Diagnosis not present

## 2014-02-28 LAB — POCT INR: INR: 2.5

## 2014-03-02 ENCOUNTER — Ambulatory Visit: Payer: Medicare Other | Admitting: Nurse Practitioner

## 2014-03-02 DIAGNOSIS — M9901 Segmental and somatic dysfunction of cervical region: Secondary | ICD-10-CM | POA: Diagnosis not present

## 2014-03-02 DIAGNOSIS — M5022 Other cervical disc displacement, mid-cervical region: Secondary | ICD-10-CM | POA: Diagnosis not present

## 2014-03-03 ENCOUNTER — Ambulatory Visit (INDEPENDENT_AMBULATORY_CARE_PROVIDER_SITE_OTHER): Payer: Medicare Other | Admitting: Cardiology

## 2014-03-03 ENCOUNTER — Encounter: Payer: Self-pay | Admitting: Cardiology

## 2014-03-03 VITALS — BP 138/82 | HR 70 | Ht 74.0 in | Wt 259.6 lb

## 2014-03-03 DIAGNOSIS — I10 Essential (primary) hypertension: Secondary | ICD-10-CM

## 2014-03-03 DIAGNOSIS — I482 Chronic atrial fibrillation, unspecified: Secondary | ICD-10-CM

## 2014-03-03 DIAGNOSIS — R600 Localized edema: Secondary | ICD-10-CM

## 2014-03-03 DIAGNOSIS — R609 Edema, unspecified: Secondary | ICD-10-CM | POA: Diagnosis not present

## 2014-03-03 DIAGNOSIS — I4891 Unspecified atrial fibrillation: Secondary | ICD-10-CM | POA: Diagnosis not present

## 2014-03-03 NOTE — Patient Instructions (Signed)
Your physician wants you to follow-up in: 6 months with Dr. Radford Pax. You will receive a reminder letter in the mail two months in advance. If you don't receive a letter, please call our office to schedule the follow-up appointment.

## 2014-03-03 NOTE — Progress Notes (Signed)
[facility], [address], [place]  [address] Phone: [phone] Fax:  [phone]  Date:  03/03/2014   ID:  YI HAUGAN, DOB 08-10-1940, MRN [id number]  PCP:  Wenda Low, MD  Cardiologist:  Fransico Him, MD    History of Present Illness: Brian [name] is a 73 y.o. male with chronic atrial fibrillation, chronic anticoagulation, chronic LE edema and HTN who presents today for followup. He is doing well. He denies any chest pain, SOB, DOE, LE edema, dizziness, palpitations or syncope. Since I saw him his LE edema has resolved with TED hose compression stockings.   Wt Readings from Last 3 Encounters:  03/03/14 259 lb 9.6 oz (117.754 kg)  12/01/13 258 lb (117.028 kg)  09/29/13 265 lb (120.203 kg)     Past Medical History  Diagnosis Date  . Dyslipidemia   . Mitral regurgitation     Mild  . Abnormal PFT 1. 05/18/08  2. 11/30/08    1. Showed mild airflow obstruction, mild restriction, mild diffusion defect; FEV1 2.22(64%), FVC 3.33(65%), FEVi% 67, TLC 5.19(69%), DLCO 77%, +BD  2. FEV1 2.38(73%), FVC 3.81(80%), FEV1% 63, TLC 5.61(80%), DLCO 79%, no BD  . Parkinsonism 06/22/2012  . Paresthesias 06/22/2012  . Hyperlipidemia   . GERD (gastroesophageal reflux disease)   . Erectile dysfunction   . BPH (benign prostatic hyperplasia)   . Restrictive lung disease   . CKD (chronic kidney disease) stage 2, GFR 60-89 ml/min   . Cellulitis     right leg MRSA  . Osteoarthritis   . Allergic rhinitis   . Anxiety   . Hypertension   . Atrial fibrillation     persistent afib    Current Outpatient Prescriptions  Medication Sig Dispense Refill  . amLODipine (NORVASC) 10 MG tablet TAKE ONE TABLET BY MOUTH ONCE DAILY. 30 tablet 0  . amLODipine (NORVASC) 10 MG tablet TAKE ONE TABLET BY MOUTH ONCE DAILY 30 tablet 0  . BENICAR 40 MG tablet TAKE ONE TABLET BY MOUTH ONCE DAILY 30 tablet 0  . carbidopa-levodopa (SINEMET CR) 50-200 MG per tablet TAKE ONE TABLET BY MOUTH AT BEDTIME 30 tablet 3   . carbidopa-levodopa (SINEMET IR) 25-100 MG per tablet TAKE ONE TABLET BY MOUTH 4 TIMES DAILY-AT 7 AM,11 AM,3 PM AND 7 PM 360 tablet 0  . Cholecalciferol (VITAMIN D3) 1000 UNITS CAPS Take by mouth daily.      . clonazePAM (KLONOPIN) 0.25 MG disintegrating tablet Take 1 tablet (0.25 mg total) by mouth at bedtime. 30 tablet 5  . doxazosin (CARDURA) 2 MG tablet TAKE THREE TABLETS BY MOUTH ONCE DAILY. 90 tablet 0  . escitalopram (LEXAPRO) 5 MG tablet Take 1 tablet (5 mg total) by mouth daily. 30 tablet 3  . famotidine (PEPCID) 40 MG tablet Take 40 mg by mouth daily.     . furosemide (LASIX) 20 MG tablet Take 20 mg by mouth as needed.     . gabapentin (NEURONTIN) 100 MG capsule TAKE ONE CAPSULE BY MOUTH THREE TIMES DAILY 270 capsule 1  . ketoconazole (NIZORAL) 2 % cream Apply 2 application topically 2 (two) times a week.    Marland Kitchen KLOR-CON M10 10 MEQ tablet Take 1 tablet by mouth daily.    . Multiple Vitamins-Minerals (CENTRUM SILVER PO) Take by mouth daily.      . nebivolol (BYSTOLIC) 5 MG tablet Take 10 mg by mouth daily.     . pravastatin (PRAVACHOL) 40 MG tablet Take 40 mg by mouth daily.     Marland Kitchen  warfarin (COUMADIN) 5 MG tablet Take 1 to 1.5 tablets by mouth daily as directed by coumadin clinic 135 tablet 1   No current facility-administered medications for this visit.    Allergies:    Allergies  Allergen Reactions  . Lisinopril Other (See Comments)    coughing    Social History:  The patient  reports that he has never smoked. He has never used smokeless tobacco. He reports that he does not drink alcohol or use illicit drugs.   Family History:  The patient's family history includes Diabetes in his mother; Pancreatic cancer in his brother and other; Stroke in his father.   ROS:  Please see the history of present illness.      All other systems reviewed and negative.   PHYSICAL EXAM: VS:  BP 138/82 mmHg  Pulse 70  Ht 6\' 2"  (1.88 m)  Wt 259 lb 9.6 oz (117.754 kg)  BMI 33.32 kg/m2 Well  nourished, well developed, in no acute distress HEENT: normal Neck: no JVD Cardiac:  normal S1, S2; RRR; no murmur Lungs:  clear to auscultation bilaterally, no wheezing, rhonchi or rales Abd: soft, nontender, no hepatomegaly Ext: trace edema Skin: warm and dry Neuro:  CNs 2-12 intact, no focal abnormalities noted  EKG:  Atrial fibrillation with CVR and nonspecific T wave abnormality   ASSESSMENT AND PLAN:  1. Chronic atrial fibrillation rated controlled - continue Bystolic/Warfarin 2. HTN - controlled - Continue amlodipine/Olmesartan/doxazosin/Bystolic 3.  Chronic LE edema - resolved - continue Lasix 4. Chronic systemic anticoagulation  Followup with me in 6 months    Signed, Fransico Him, MD [facility] 03/03/2014 8:20 AM

## 2014-03-07 DIAGNOSIS — R351 Nocturia: Secondary | ICD-10-CM | POA: Diagnosis not present

## 2014-03-07 DIAGNOSIS — N401 Enlarged prostate with lower urinary tract symptoms: Secondary | ICD-10-CM | POA: Diagnosis not present

## 2014-03-07 DIAGNOSIS — R350 Frequency of micturition: Secondary | ICD-10-CM | POA: Diagnosis not present

## 2014-03-08 DIAGNOSIS — M5022 Other cervical disc displacement, mid-cervical region: Secondary | ICD-10-CM | POA: Diagnosis not present

## 2014-03-08 DIAGNOSIS — M9901 Segmental and somatic dysfunction of cervical region: Secondary | ICD-10-CM | POA: Diagnosis not present

## 2014-03-13 DIAGNOSIS — M5022 Other cervical disc displacement, mid-cervical region: Secondary | ICD-10-CM | POA: Diagnosis not present

## 2014-03-13 DIAGNOSIS — M9901 Segmental and somatic dysfunction of cervical region: Secondary | ICD-10-CM | POA: Diagnosis not present

## 2014-03-14 ENCOUNTER — Other Ambulatory Visit: Payer: Self-pay | Admitting: Neurology

## 2014-03-14 NOTE — Telephone Encounter (Signed)
Last OV note says: We will stop the Lexapro to see if your facial swelling improves, which may take days or weeks to go away.  At some point, maybe next visit we will talk about restarting an antidepressant, maybe a different one.

## 2014-03-15 DIAGNOSIS — M5022 Other cervical disc displacement, mid-cervical region: Secondary | ICD-10-CM | POA: Diagnosis not present

## 2014-03-15 DIAGNOSIS — M9901 Segmental and somatic dysfunction of cervical region: Secondary | ICD-10-CM | POA: Diagnosis not present

## 2014-03-15 NOTE — Telephone Encounter (Signed)
I have tried to reach the patient multiple times to no avail.

## 2014-03-22 ENCOUNTER — Other Ambulatory Visit: Payer: Self-pay | Admitting: Neurology

## 2014-03-22 DIAGNOSIS — M5022 Other cervical disc displacement, mid-cervical region: Secondary | ICD-10-CM | POA: Diagnosis not present

## 2014-03-22 DIAGNOSIS — M9901 Segmental and somatic dysfunction of cervical region: Secondary | ICD-10-CM | POA: Diagnosis not present

## 2014-03-22 NOTE — Telephone Encounter (Signed)
Patients wife is calling to get a refill for Escitalopram called to [facility] on [address]. Patient is out of medication. Thank you.

## 2014-03-22 NOTE — Telephone Encounter (Signed)
Last OV note says: We will stop the Lexapro to see if your facial swelling improves, which may take days or weeks to go away. At some point, maybe next visit we will talk about restarting an antidepressant, maybe a different one. I called back.  Ms Baby said the patient never stopped taking Lexapro as recommended.  He stopped taking a different drug prescribed by Urologist and swelling subsided.  She would like a refill on Lexapro to last until OV in Jan, and says at that time they may discuss changing to different antidepressant.   Dr Rexene Alberts is out of the office.  Forwarding message to [facility] for review.  Okay to refill?  Please advise.  Thank you.

## 2014-03-23 DIAGNOSIS — M5022 Other cervical disc displacement, mid-cervical region: Secondary | ICD-10-CM | POA: Diagnosis not present

## 2014-03-23 DIAGNOSIS — M9901 Segmental and somatic dysfunction of cervical region: Secondary | ICD-10-CM | POA: Diagnosis not present

## 2014-03-27 ENCOUNTER — Ambulatory Visit (INDEPENDENT_AMBULATORY_CARE_PROVIDER_SITE_OTHER): Payer: Medicare Other | Admitting: Neurology

## 2014-03-27 ENCOUNTER — Encounter: Payer: Self-pay | Admitting: Neurology

## 2014-03-27 VITALS — BP 150/80 | HR 87 | Temp 97.9°F | Ht 72.0 in | Wt 232.0 lb

## 2014-03-27 DIAGNOSIS — G20 Parkinson's disease: Secondary | ICD-10-CM | POA: Diagnosis not present

## 2014-03-27 DIAGNOSIS — F419 Anxiety disorder, unspecified: Secondary | ICD-10-CM | POA: Diagnosis not present

## 2014-03-27 DIAGNOSIS — G4752 REM sleep behavior disorder: Secondary | ICD-10-CM

## 2014-03-27 MED ORDER — CARBIDOPA-LEVODOPA ER 50-200 MG PO TBCR: 1.0000 | EXTENDED_RELEASE_TABLET | Freq: Every day | ORAL | Status: DC

## 2014-03-27 MED ORDER — CLONAZEPAM 0.25 MG PO TBDP: 0.2500 mg | ORAL_TABLET | Freq: Every day | ORAL | Status: DC

## 2014-03-27 MED ORDER — ESCITALOPRAM OXALATE 5 MG PO TABS: 5.0000 mg | ORAL_TABLET | Freq: Every day | ORAL | Status: DC

## 2014-03-27 MED ORDER — CARBIDOPA-LEVODOPA 25-100 MG PO TABS: 1.0000 | ORAL_TABLET | Freq: Every day | ORAL | Status: DC

## 2014-03-27 MED ORDER — GABAPENTIN 100 MG PO CAPS: 100.0000 mg | ORAL_CAPSULE | Freq: Three times a day (TID) | ORAL | Status: DC

## 2014-03-27 NOTE — Progress Notes (Signed)
Subjective:    Patient ID: Brian [name] is a 74 y.o. male.  HPI     Interim history:  Brian [name] is a 74 year old right-handed gentleman with an underlying medical history of atrial fibrillation, hypertension, OSA, and hyperlipidemia who presents for followup consultation of his right-sided predominant Parkinson's disease. He is unaccompanied today. I last saw him on 12/01/2013, at which time he reported, that he stopped Myrbetric due to new onset facial swelling, but this was no better after stopping it. His facial swelling may have started after the lexapro. Adding long-acting Sinemet helped him sleep a little bit longer up to 3 AM. I suggested he stop Lexapro because of a possible allergic reaction to it. I also added low-dose clonazepam for his RBD and for his sleep.  Today, he reports sleeping a little better, tremor is worse. He does not feel that his Sinemet lasts him throughout the 4 hours in between 2 doses. He goes to the gym twice a week only. He is back on the antidepressant. His primary care physician told him that his swelling was not from the antidepressant. He has a new medication for his bladder from his urologist but has not filled it yet.  I saw him on 09/29/2013 at which time he presented for a sooner than scheduled appointment because of increasing tremors and parkinsonian symptoms. He also felt more anxious but not frankly depressed. His wife felt that he may have been depressed. She reported dream enactments. She also reported loud snoring. He had a sleep study over 5 years ago, and that was negative for OSA at the time. He had started Myrbetriq recently. He has been taking gabapentin 3 times a day, and 7, 11 and 3. He takes Sinemet regularly at 7, 11, 3, and 7. I suggested that he start low-dose Lexapro at 5 mg strength. I changed the timing of his gabapentin to one pill at 7 AM, 1 pill at 11 AM and one pill at 7 PM. I asked him to continue with Sinemet 4 times a day. I asked him  to add a long-acting Sinemet CR 50/200 mg strength once daily at bed time. I suggested he return for a sleep study. He had a diagnostic polysomnogram on 11/09/2013 and I went over his test results with him in detail today. Sleep efficiency was reduced at 58.7% with a latency to sleep of 82 minutes and wake after sleep onset of 117 minutes with mild to moderate sleep fragmentation noted. He had an increased percentage of light stage sleep, absence of deep sleep and 16.8% of dream sleep with a prolonged REM latency. He had mild to moderate snoring. Total AHI was 3.2 per hour, he had some lack of REM atonia.  I saw him on 05/31/2013, at which time I felt he was fairly stable. I kept him on Sinemet 4 times a day and gabapentin 100 mg 3 times a day. He reported, going up to 4 times a day with his Sinemet helped. He reported no side effects with gabapentin or Sinemet and seemed to tolerate them well. He had some residual facial tingling which was tolerable to him. He has LBP and went to [facility]. He had an Xray of the back and was told there was degenerative disease and was given and was given a 3 week taper of oral steroids, which helped. He had stopped exercising d/t back pain and was scheduled for PT evaluation through ortho. He has been on metamucil for constipation which helped.  I saw him on 11/30/2012, at which time I suggested that he continue gabapentin 100 mg 1 pill 3 times a day for his paresthesias and encouraged him to continue Sinemet with 25/100 mg strength one tablet 4 times a day, at 7, 11, 3 PM and 7 PM.  I first met him on 06/22/2012 and he presented on 08/23/2012 for a sooner than scheduled appointment because he felt his medication was not helpful. He previously followed with Dr. Jeneen Rinks love and had been complaining of paresthesias. Dr. Erling Cruz had started him on low-dose gabapentin. For his parkinsonism which was first noted in June of 2012 he was tried on pramipexole which helped, but at his  first visit with me he told me that his gabapentin was not helpful and he also discontinued pramipexole a few months prior because of swelling in his feet and ankles. This improved after stopping both medications. At the time of his first visit with me I felt he had mild parkinsonism without much in the way of lateralization. I started him on gabapentin again because of his paresthesias and also asked him to start low-dose Sinemet. He called back stating that the Sinemet was not helpful. He requested a sooner appointment. He had been continuing taking gabapentin 100 mg 3 times a day and Sinemet one pill 3 times a day with minimal improvement as he reported last time. I suggested at that visit that he increase his Sinemet to one pill 4 times a day and continue with gabapentin 100 mg 3 times a day. I also felt that he may have right sided predominant idiopathic Parkinson's disease due to an intermittent tremor noted only on the right side.   His Past Medical History Is Significant For: Past Medical History  Diagnosis Date  . Dyslipidemia   . Mitral regurgitation     Mild  . Abnormal PFT 1. 05/18/08  2. 11/30/08    1. Showed mild airflow obstruction, mild restriction, mild diffusion defect; FEV1 2.22(64%), FVC 3.33(65%), FEVi% 67, TLC 5.19(69%), DLCO 77%, +BD  2. FEV1 2.38(73%), FVC 3.81(80%), FEV1% 63, TLC 5.61(80%), DLCO 79%, no BD  . Parkinsonism 06/22/2012  . Paresthesias 06/22/2012  . Hyperlipidemia   . GERD (gastroesophageal reflux disease)   . Erectile dysfunction   . BPH (benign prostatic hyperplasia)   . Restrictive lung disease   . CKD (chronic kidney disease) stage 2, GFR 60-89 ml/min   . Cellulitis     right leg MRSA  . Osteoarthritis   . Allergic rhinitis   . Anxiety   . Hypertension   . Atrial fibrillation     persistent afib    His Past Surgical History Is Significant For: Past Surgical History  Procedure Laterality Date  . Cataract extraction    . Cardioversion      multiple   . Cardiac catheterization  11/2009    normal coronary arteries    His Family History Is Significant For: Family History  Problem Relation Age of Onset  . Diabetes Mother     DM  . Stroke Father     CVA  . Pancreatic cancer Brother   . Pancreatic cancer Other     Nephew    His Social History Is Significant For: History   Social History  . Marital Status: Married    Spouse Name: [name]    Number of Children: 4  . Years of Education: masters   Occupational History  . Benefit analyst     Worked with [employer]  Social History Main Topics  . Smoking status: Never Smoker   . Smokeless tobacco: Never Used  . Alcohol Use: No     Comment: occasionally  . Drug Use: No  . Sexual Activity: None   Other Topics Concern  . None   Social History Narrative   Married and lives in [place].  Retired Optometrist.    His Allergies Are:  Allergies  Allergen Reactions  . Lisinopril Other (See Comments)    coughing  :   His Current Medications Are:  Outpatient Encounter Prescriptions as of 03/27/2014  Medication Sig  . amLODipine (NORVASC) 10 MG tablet TAKE ONE TABLET BY MOUTH ONCE DAILY.  Marland Kitchen amLODipine (NORVASC) 10 MG tablet TAKE ONE TABLET BY MOUTH ONCE DAILY  . BENICAR 40 MG tablet TAKE ONE TABLET BY MOUTH ONCE DAILY  . BYSTOLIC 10 MG tablet   . carbidopa-levodopa (SINEMET CR) 50-200 MG per tablet TAKE ONE TABLET BY MOUTH AT BEDTIME  . carbidopa-levodopa (SINEMET IR) 25-100 MG per tablet TAKE ONE TABLET BY MOUTH 4 TIMES DAILY-AT 7 AM,11 AM,3 PM AND 7 PM  . Cholecalciferol (VITAMIN D3) 1000 UNITS CAPS Take by mouth daily.    . clonazePAM (KLONOPIN) 0.25 MG disintegrating tablet Take 1 tablet (0.25 mg total) by mouth at bedtime.  Marland Kitchen doxazosin (CARDURA) 2 MG tablet TAKE THREE TABLETS BY MOUTH ONCE DAILY.  Marland Kitchen escitalopram (LEXAPRO) 5 MG tablet TAKE ONE TABLET BY MOUTH ONCE DAILY  . famotidine (PEPCID) 40 MG tablet Take 40 mg by mouth daily.   . furosemide (LASIX) 20 MG tablet  Take 20 mg by mouth as needed.   . gabapentin (NEURONTIN) 100 MG capsule TAKE ONE CAPSULE BY MOUTH THREE TIMES DAILY  . ketoconazole (NIZORAL) 2 % cream Apply 2 application topically 2 (two) times a week.  Marland Kitchen KLOR-CON M10 10 MEQ tablet Take 1 tablet by mouth daily.  . Multiple Vitamins-Minerals (CENTRUM SILVER PO) Take by mouth daily.    . nebivolol (BYSTOLIC) 5 MG tablet Take 10 mg by mouth daily.   . pravastatin (PRAVACHOL) 40 MG tablet Take 40 mg by mouth daily.   Marland Kitchen warfarin (COUMADIN) 5 MG tablet Take 1 to 1.5 tablets by mouth daily as directed by coumadin clinic  :  Review of Systems:  Out of a complete 14 point review of systems, all are reviewed and negative with the exception of these symptoms as listed below:   Review of Systems  Neurological: Positive for tremors.       Snoring    Objective:  Neurologic Exam  Physical Exam Physical Examination:   Filed Vitals:   03/27/14 0812  BP: 150/80  Pulse: 87  Temp: 97.9 F (36.6 C)   General Examination: The patient is a very pleasant 74 y.o. male in no acute distress. He appears well-developed and well-nourished and well groomed. He is overweight.   HEENT: Normocephalic, atraumatic, pupils are equal, round and reactive to light and accommodation. He has very mild facial puffiness especially under his right eye. He has no rash. He has no signs of urticaria. Extraocular tracking shows mild saccadic breakdown without nystagmus noted. There is no limitation to gaze. There is no apraxia of eyelid opening. He has bilateral cataract repairs. There is mild decrease in eye blink rate. Hearing is intact. Face is symmetric with moderate facial masking and normal facial sensation. There is an intermittent lip and head tremor. This seems to be relatively new to me. Neck is moderately rigid with intact passive ROM. There  are no carotid bruits on auscultation. Oropharynx exam reveals no mouth dryness. No significant airway crowding is noted,  tonsils are absent, but he does have a floppy appearing soft palate and his uvula reaches far down . Mallampati is class II. Tongue protrudes centrally and palate elevates symmetrically.    Chest: is clear to auscultation without wheezing, rhonchi or crackles noted.  Heart: sounds are irregularly irregular without murmurs, rubs or gallops noted.   Abdomen: is soft, non-tender and non-distended with normal bowel sounds appreciated on auscultation.  Extremities: There is 1+ edema in the ankles bilaterally, little worse.   Skin: is warm and dry with no trophic changes noted.   Musculoskeletal: exam reveals no obvious joint deformities, tenderness or joint swelling or erythema.  Neurologically:  Mental status: The patient is awake and alert, paying good attention. He is able to to provide a good history. He is oriented to: person, place, time/date, situation, day of week, month of year and year. His memory, attention, language and knowledge are intact. There is no aphasia, agnosia, apraxia or anomia. There is a mild degree of bradyphrenia. Speech is mildly hypophonic with no dysarthria noted. Mood is congruent and affect is normal.  Cranial nerves are as described above under HEENT exam. In addition, shoulder shrug is normal with equal shoulder height noted.  Motor exam: Normal bulk, and strength for age is noted. Tone is mildly rigid with absence of cogwheeling in the bilateral extremities. Tone is increased on the R>L. There is an intermittent right upper extremity tremor at rest, unchanged. There is overall mild bradykinesia. There is no drift or rebound. Romberg is negative. Reflexes are 1+ in the upper extremities and 1+ in the lower extremities. Fine motor skills exam reveals: Finger taps are moderately impaired on the right and mildly impaired on the left. Hand movements are mildly impaired on the right and mildly impaired on the left. RAP (rapid alternating patting) is mildly impaired on the  right and mildly impaired on the left. Foot taps are moderately to severely impaired on the right and moderately impaired on the left. Foot agility (in the form of heel stomping) is mildly impaired on the right and mildly impaired on the left.    Cerebellar testing shows no dysmetria or intention tremor on finger to nose testing. There is no truncal or gait ataxia.   Sensory exam is intact to light touch, temperature and vibration sense.   Gait, station and balance exam: He stands up from the seated position with mild difficulty. No veering to one side is noted. He is not noted to lean to one side. Posture is moderately stooped. Stance is  slightly wide based. He walks with decrease in stride length and pace and decreased arm swing on both sides, R more than L. He turns in 3 steps. His walk is a little worse from last time. Tandem walk is not possible. Balance is mildly impaired with mild insecurity with turns.   Assessment and Plan:   In summary, Brian [name] is a very pleasant 74 year old male with a history of atrial fibrillation, hypertension, hyperlipidemia, who presents for followup consultation of his right-sided predominant Parkinson's disease, most likely akinetic-rigid type, complicated by anxiety, mild depression, sleep disorder, including sleep fragmentation, nocturia, insomnia, and RBD. He is back on the antidepressant. His facial swelling has resolved. His lower leg edema is a little worse. I would like to increase his Sinemet to 1 pill 5 times a day, 1 pill every 3  hours starting at 7 AM. We will keep the Sinemet CR the same. He supposed to be on clonazepam each night but is not sure if he is actually taking it. He will check at home and also call me back with his new medication from his urologist. I provided him refills on all his prescriptions including gabapentin 3 times a day. I will see him back in March for his previously scheduled, and I encouraged him to call us back with the  information about all his prescriptions from me as well as his new prescription from his urologist. I answered all his questions today and he was in agreement.

## 2014-03-27 NOTE — Patient Instructions (Signed)
I think your Parkinson's disease has remained fairly stable, which is reassuring. Nevertheless, as you know, this disease does progress with time. It can affect your balance, your memory, your mood, your bowel and bladder function, your posture, balance and walking. Overall you are doing fairly well but I do want to suggest a few things today:  Remember to drink plenty of fluid, eat healthy meals and do not skip any meals. Try to eat protein with a every meal and eat a healthy snack such as fruit or nuts in between meals. Try to keep a regular sleep-wake schedule and try to exercise daily, particularly in the form of walking, 20-30 minutes a day, if you can.   Taking your medication on schedule is key.   Try to stay active physically and mentally. Engage in social activities in your community and with your family and try to keep up with current events by reading the newspaper or watching the news. Try to do word puzzles and you may like to do word puzzles and brain games on the computer such as on [email or web address]   As far as your medications are concerned, I would like to suggest that you take your current medication with the following additional changes: take sinemet 25/100 mg: 1 pill 5 times a day, at 7, 10, 1PM, 4PM, and 7PM and the long-acting 50/200mg , 1 at bedtime.     I would like to see you back in 3 months, sooner if we need to. Please call us with any interim questions, concerns, problems, updates or refill requests.  Please also call us for any test results so we can go over those with you on the phone. Our nursing staff will answer any of your questions and relay your messages to me and also relay most of my messages to you.  Our phone number is [phone]. We also have an after hours call service for urgent matters and there is a physician on-call for urgent questions, that cannot wait till the next work day. For any emergencies you know to call 911 or go to the nearest emergency room.

## 2014-03-28 ENCOUNTER — Telehealth: Payer: Self-pay | Admitting: Neurology

## 2014-03-28 DIAGNOSIS — M1711 Unilateral primary osteoarthritis, right knee: Secondary | ICD-10-CM | POA: Diagnosis not present

## 2014-03-28 DIAGNOSIS — M1712 Unilateral primary osteoarthritis, left knee: Secondary | ICD-10-CM | POA: Diagnosis not present

## 2014-03-28 NOTE — Telephone Encounter (Signed)
At the previous visit and again at the visit yesterday I prescribed one more medication to help him sleep at night and to tone down his active dreams. This is clonazepam. He can start taking this at night at bedtime. Please remind him that I provided him with a written prescription for this medicine at his visit yesterday.

## 2014-03-28 NOTE — Telephone Encounter (Signed)
FYI-Patient calling to inform Dr. Rexene Alberts of medications he's currently on, carbidopa-levodopa (SINEMET CR) 50-200 MG per tablet,  carbidopa-levodopa (SINEMET IR) 25-100 MG per tablet,  escitalopram (LEXAPRO) 5 MG tablet, and gabapentin (NEURONTIN) 100 MG capsule.  Patient stated he's not taking medication prescribed by Urologist due to [facility] will not pay for it, Rx's [facility] and [facility].

## 2014-03-28 NOTE — Telephone Encounter (Signed)
Shared Dr Guadelupe Sabin message with patient and he said that they had not had a chance to have it filled but will sometimes this week and just wanted to make aware of the other  medications listed below.

## 2014-03-29 DIAGNOSIS — M5022 Other cervical disc displacement, mid-cervical region: Secondary | ICD-10-CM | POA: Diagnosis not present

## 2014-03-29 DIAGNOSIS — M9901 Segmental and somatic dysfunction of cervical region: Secondary | ICD-10-CM | POA: Diagnosis not present

## 2014-03-30 ENCOUNTER — Other Ambulatory Visit: Payer: Self-pay | Admitting: Internal Medicine

## 2014-04-11 ENCOUNTER — Ambulatory Visit (INDEPENDENT_AMBULATORY_CARE_PROVIDER_SITE_OTHER): Payer: Medicare Other | Admitting: Pharmacist

## 2014-04-11 ENCOUNTER — Other Ambulatory Visit: Payer: Self-pay | Admitting: Cardiology

## 2014-04-11 DIAGNOSIS — I4891 Unspecified atrial fibrillation: Secondary | ICD-10-CM

## 2014-04-11 DIAGNOSIS — Z7901 Long term (current) use of anticoagulants: Secondary | ICD-10-CM

## 2014-04-11 DIAGNOSIS — Z5181 Encounter for therapeutic drug level monitoring: Secondary | ICD-10-CM | POA: Diagnosis not present

## 2014-04-11 LAB — POCT INR: INR: 2.5

## 2014-04-24 ENCOUNTER — Other Ambulatory Visit: Payer: Self-pay | Admitting: Cardiology

## 2014-05-03 DIAGNOSIS — K219 Gastro-esophageal reflux disease without esophagitis: Secondary | ICD-10-CM | POA: Diagnosis not present

## 2014-05-03 DIAGNOSIS — Z0000 Encounter for general adult medical examination without abnormal findings: Secondary | ICD-10-CM | POA: Diagnosis not present

## 2014-05-03 DIAGNOSIS — G20 Parkinson's disease: Secondary | ICD-10-CM | POA: Diagnosis not present

## 2014-05-03 DIAGNOSIS — I10 Essential (primary) hypertension: Secondary | ICD-10-CM | POA: Diagnosis not present

## 2014-05-03 DIAGNOSIS — I482 Chronic atrial fibrillation: Secondary | ICD-10-CM | POA: Diagnosis not present

## 2014-05-03 DIAGNOSIS — F419 Anxiety disorder, unspecified: Secondary | ICD-10-CM | POA: Diagnosis not present

## 2014-05-03 DIAGNOSIS — N400 Enlarged prostate without lower urinary tract symptoms: Secondary | ICD-10-CM | POA: Diagnosis not present

## 2014-05-03 DIAGNOSIS — N182 Chronic kidney disease, stage 2 (mild): Secondary | ICD-10-CM | POA: Diagnosis not present

## 2014-05-03 DIAGNOSIS — E782 Mixed hyperlipidemia: Secondary | ICD-10-CM | POA: Diagnosis not present

## 2014-05-03 DIAGNOSIS — R490 Dysphonia: Secondary | ICD-10-CM | POA: Diagnosis not present

## 2014-05-03 DIAGNOSIS — Z1389 Encounter for screening for other disorder: Secondary | ICD-10-CM | POA: Diagnosis not present

## 2014-05-04 ENCOUNTER — Telehealth: Payer: Self-pay | Admitting: Neurology

## 2014-05-04 NOTE — Telephone Encounter (Signed)
Patient's wife is calling to get a new Rx called in for Carb/Levo 25-100mg  90 day supply. Patient takes 5 pills a day. Please call to [facility] on [facility]. Thank you.

## 2014-05-04 NOTE — Telephone Encounter (Signed)
This Rx was sent for 90 day supply. With 3 refills last month.  I called back.  They are aware.

## 2014-05-09 DIAGNOSIS — G20 Parkinson's disease: Secondary | ICD-10-CM | POA: Diagnosis not present

## 2014-05-09 DIAGNOSIS — H6123 Impacted cerumen, bilateral: Secondary | ICD-10-CM | POA: Diagnosis not present

## 2014-05-09 DIAGNOSIS — J310 Chronic rhinitis: Secondary | ICD-10-CM | POA: Diagnosis not present

## 2014-05-09 DIAGNOSIS — F0280 Dementia in other diseases classified elsewhere without behavioral disturbance: Secondary | ICD-10-CM | POA: Diagnosis not present

## 2014-05-23 ENCOUNTER — Ambulatory Visit (INDEPENDENT_AMBULATORY_CARE_PROVIDER_SITE_OTHER): Payer: Medicare Other

## 2014-05-23 DIAGNOSIS — I4891 Unspecified atrial fibrillation: Secondary | ICD-10-CM

## 2014-05-23 DIAGNOSIS — Z5181 Encounter for therapeutic drug level monitoring: Secondary | ICD-10-CM

## 2014-05-23 DIAGNOSIS — Z7901 Long term (current) use of anticoagulants: Secondary | ICD-10-CM | POA: Diagnosis not present

## 2014-05-23 LAB — POCT INR: INR: 2.8

## 2014-06-01 ENCOUNTER — Encounter: Payer: Self-pay | Admitting: Cardiology

## 2014-06-01 ENCOUNTER — Ambulatory Visit (INDEPENDENT_AMBULATORY_CARE_PROVIDER_SITE_OTHER): Payer: Medicare Other | Admitting: Neurology

## 2014-06-01 ENCOUNTER — Encounter: Payer: Self-pay | Admitting: Neurology

## 2014-06-01 VITALS — BP 111/71 | HR 60 | Temp 98.7°F | Resp 18 | Ht 72.0 in | Wt 232.0 lb

## 2014-06-01 DIAGNOSIS — G20 Parkinson's disease: Secondary | ICD-10-CM | POA: Diagnosis not present

## 2014-06-01 DIAGNOSIS — R351 Nocturia: Secondary | ICD-10-CM

## 2014-06-01 DIAGNOSIS — G4752 REM sleep behavior disorder: Secondary | ICD-10-CM

## 2014-06-01 DIAGNOSIS — F419 Anxiety disorder, unspecified: Secondary | ICD-10-CM | POA: Diagnosis not present

## 2014-06-01 DIAGNOSIS — N318 Other neuromuscular dysfunction of bladder: Secondary | ICD-10-CM | POA: Diagnosis not present

## 2014-06-01 DIAGNOSIS — I482 Chronic atrial fibrillation, unspecified: Secondary | ICD-10-CM

## 2014-06-01 MED ORDER — CLONAZEPAM 0.5 MG PO TBDP: 0.5000 mg | ORAL_TABLET | Freq: Every day | ORAL | Status: DC

## 2014-06-01 NOTE — Patient Instructions (Addendum)
1. We will increase your clonazepam to 0.5 mg each night to help you sleep through the night better.  2. We will keep the lexepro at 5 mg at night for anxiety.  3. We will change the Sinemet 25/100 mg to: 1 pill at 7 AM and 7 PM and 1 1/2 pills at 11 AM and 3 PM for a total of 5 pills daily.  4. We will keep the Sinemet CR 50/200 mg at bedtime.  5. Drink more water.  6. Exercise daily, in your own limitation and in moderation.

## 2014-06-01 NOTE — Progress Notes (Signed)
Subjective:    Patient ID: Brian [name] is a 74 y.o. male.  HPI     Interim history:   Brian [name] is a 74 year old right-handed gentleman with an underlying medical history of atrial fibrillation, hypertension, OSA, and hyperlipidemia who presents for followup consultation of his right-sided predominant Parkinson's disease complicated by bladder hyperactivity, anxiety and RBD. He is accompanied by his wife today. I last saw him on 03/27/2014, at which time he reported sleeping a little bit better but his tremor was worse. He did not feel Sinemet was lasting him 4 hours in between 2 different doses. He was going to the gym twice a week. He was back on an antidepressant. He was supposed to start a new bladder medication. He never actually started clonazepam for RBD. I prescribed clonazepam for him for RBD. I increased his Sinemet to one fill 5 times a day.  Today, 06/01/2014: He is able to give most of his history, in addition, some history is also provided by his wife. He reports overall doing fairly well. His issues currently are not being able to sleep through the night and it bothers him that he consistently wakes up at 3 or 4 AM. He has to get up to use the bathroom twice per night on average and while this overall has improved since his bladder medications have been more successful, he still has disrupted sleep. He believes that the addition of clonazepam has helped. His wife agrees that clonazepam 0.25 mg helped. He also agrees that the increase in Sinemet was helpful in his motor function. Nevertheless, it is hard for him to keep up with a 5 pills a day schedule. He is asking if we can adjust his schedule. He is trying to stay active. He does feel that sometimes his knees hurt because he is exercising too long. He usually goes to the [employer] 3 days a week for about 30 minutes. He is thinking of cutting back to less time each time but maybe going daily. His wife feels that he is okay driving  locally. The addition of Lexapro helped his mood. She feels that his anxiety is less. He agrees that he is less anxious lately. His Coumadin dose is stable. His INR have been stable. He recently saw his primary care physician and had blood work.   Previously:  I saw him on 12/01/2013, at which time he reported, that he stopped Myrbetric due to new onset facial swelling, but this was no better after stopping it. His facial swelling may have started after the lexapro. Adding long-acting Sinemet helped him sleep a little bit longer up to 3 AM. I suggested he stop Lexapro because of a possible allergic reaction to it. I also added low-dose clonazepam for his RBD and for his sleep.  I saw him on 09/29/2013 at which time he presented for a sooner than scheduled appointment because of increasing tremors and parkinsonian symptoms. He also felt more anxious but not frankly depressed. His wife felt that he may have been depressed. She reported dream enactments. She also reported loud snoring. He had a sleep study over 5 years ago, and that was negative for OSA at the time. He had started Myrbetriq recently. He has been taking gabapentin 3 times a day, and 7, 11 and 3. He takes Sinemet regularly at 7, 11, 3, and 7. I suggested that he start low-dose Lexapro at 5 mg strength. I changed the timing of his gabapentin to one pill at 7  AM, 1 pill at 11 AM and one pill at 7 PM. I asked him to continue with Sinemet 4 times a day. I asked him to add a long-acting Sinemet CR 50/200 mg strength once daily at bed time. I suggested he return for a sleep study. He had a diagnostic polysomnogram on 11/09/2013 and I went over his test results with him in detail today. Sleep efficiency was reduced at 58.7% with a latency to sleep of 82 minutes and wake after sleep onset of 117 minutes with mild to moderate sleep fragmentation noted. He had an increased percentage of light stage sleep, absence of deep sleep and 16.8% of dream sleep with a  prolonged REM latency. He had mild to moderate snoring. Total AHI was 3.2 per hour, he had some lack of REM atonia.   I saw him on 05/31/2013, at which time I felt he was fairly stable. I kept him on Sinemet 4 times a day and gabapentin 100 mg 3 times a day. He reported, going up to 4 times a day with his Sinemet helped. He reported no side effects with gabapentin or Sinemet and seemed to tolerate them well. He had some residual facial tingling which was tolerable to him. He has LBP and went to [facility]. He had an Xray of the back and was told there was degenerative disease and was given and was given a 3 week taper of oral steroids, which helped. He had stopped exercising d/t back pain and was scheduled for PT evaluation through ortho. He has been on metamucil for constipation which helped.   I saw him on 11/30/2012, at which time I suggested that he continue gabapentin 100 mg 1 pill 3 times a day for his paresthesias and encouraged him to continue Sinemet with 25/100 mg strength one tablet 4 times a day, at 7, 11, 3 PM and 7 PM.   I first met him on 06/22/2012 and he presented on 08/23/2012 for a sooner than scheduled appointment because he felt his medication was not helpful. He previously followed with Dr. Jeneen Rinks love and had been complaining of paresthesias. Dr. Erling Cruz had started him on low-dose gabapentin. For his parkinsonism which was first noted in June of 2012 he was tried on pramipexole which helped, but at his first visit with me he told me that his gabapentin was not helpful and he also discontinued pramipexole a few months prior because of swelling in his feet and ankles. This improved after stopping both medications. At the time of his first visit with me I felt he had mild parkinsonism without much in the way of lateralization. I started him on gabapentin again because of his paresthesias and also asked him to start low-dose Sinemet. He called back stating that the Sinemet was not helpful. He  requested a sooner appointment. He had been continuing taking gabapentin 100 mg 3 times a day and Sinemet one pill 3 times a day with minimal improvement as he reported last time. I suggested at that visit that he increase his Sinemet to one pill 4 times a day and continue with gabapentin 100 mg 3 times a day. I also felt that he may have right sided predominant idiopathic Parkinson's disease due to an intermittent tremor noted only on the right side.   His Past Medical History Is Significant For: Past Medical History  Diagnosis Date  . Dyslipidemia   . Mitral regurgitation     Mild  . Abnormal PFT 1. 05/18/08  2. 11/30/08  1. Showed mild airflow obstruction, mild restriction, mild diffusion defect; FEV1 2.22(64%), FVC 3.33(65%), FEVi% 67, TLC 5.19(69%), DLCO 77%, +BD  2. FEV1 2.38(73%), FVC 3.81(80%), FEV1% 63, TLC 5.61(80%), DLCO 79%, no BD  . Parkinsonism 06/22/2012  . Paresthesias 06/22/2012  . Hyperlipidemia   . GERD (gastroesophageal reflux disease)   . Erectile dysfunction   . BPH (benign prostatic hyperplasia)   . Restrictive lung disease   . CKD (chronic kidney disease) stage 2, GFR 60-89 ml/min   . Cellulitis     right leg MRSA  . Osteoarthritis   . Allergic rhinitis   . Anxiety   . Hypertension   . Atrial fibrillation     persistent afib    His Past Surgical History Is Significant For: Past Surgical History  Procedure Laterality Date  . Cataract extraction    . Cardioversion      multiple  . Cardiac catheterization  11/2009    normal coronary arteries    His Family History Is Significant For: Family History  Problem Relation Age of Onset  . Diabetes Mother     DM  . Stroke Father     CVA  . Pancreatic cancer Brother   . Pancreatic cancer Other     Nephew    His Social History Is Significant For: History   Social History  . Marital Status: Married    Spouse Name: [name]  . Number of Children: 4  . Years of Education: masters   Occupational History  .  Benefit analyst     Worked with [employer]   Social History Main Topics  . Smoking status: Never Smoker   . Smokeless tobacco: Never Used  . Alcohol Use: No     Comment: occasionally  . Drug Use: No  . Sexual Activity: Not on file   Other Topics Concern  . None   Social History Narrative   Married and lives in [place].  Retired Optometrist.    His Allergies Are:  Allergies  Allergen Reactions  . Lisinopril Other (See Comments)    coughing  :   His Current Medications Are:  Outpatient Encounter Prescriptions as of 06/01/2014  Medication Sig  . amLODipine (NORVASC) 10 MG tablet TAKE ONE TABLET BY MOUTH ONCE DAILY.  Marland Kitchen amLODipine (NORVASC) 10 MG tablet TAKE ONE TABLET BY MOUTH ONCE DAILY  . BENICAR 40 MG tablet TAKE ONE TABLET BY MOUTH ONCE DAILY  . BYSTOLIC 10 MG tablet   . carbidopa-levodopa (SINEMET CR) 50-200 MG per tablet Take 1 tablet by mouth at bedtime.  . carbidopa-levodopa (SINEMET IR) 25-100 MG per tablet Take 1 tablet by mouth 5 (five) times daily. 7, 10., 1PM, 4PM, 7PM  . Cholecalciferol (VITAMIN D3) 1000 UNITS CAPS Take by mouth daily.    . clonazePAM (KLONOPIN) 0.25 MG disintegrating tablet Take 1 tablet (0.25 mg total) by mouth at bedtime.  Marland Kitchen doxazosin (CARDURA) 2 MG tablet TAKE THREE TABLETS BY MOUTH ONCE DAILY.  Marland Kitchen escitalopram (LEXAPRO) 5 MG tablet Take 1 tablet (5 mg total) by mouth daily.  . famotidine (PEPCID) 40 MG tablet Take 40 mg by mouth daily.   . furosemide (LASIX) 20 MG tablet Take 20 mg by mouth as needed.   . gabapentin (NEURONTIN) 100 MG capsule Take 1 capsule (100 mg total) by mouth 3 (three) times daily.  Marland Kitchen ketoconazole (NIZORAL) 2 % cream Apply 2 application topically 2 (two) times a week.  Marland Kitchen KLOR-CON M10 10 MEQ tablet Take 1 tablet by mouth daily.  Marland Kitchen  Multiple Vitamins-Minerals (CENTRUM SILVER PO) Take by mouth daily.    . nebivolol (BYSTOLIC) 5 MG tablet Take 10 mg by mouth daily.   Marland Kitchen oxybutynin (DITROPAN) 5 MG tablet   . pravastatin  (PRAVACHOL) 40 MG tablet Take 40 mg by mouth daily.   Marland Kitchen warfarin (COUMADIN) 5 MG tablet Take 1 to 1.5 tablets by mouth daily as directed by coumadin clinic  :  Review of Systems:  Out of a complete 14 point review of systems, all are reviewed and negative with the exception of these symptoms as listed below:    Review of Systems  HENT: Positive for voice change.   All other systems reviewed and are negative.   Objective:  Neurologic Exam  Physical Exam Physical Examination:   Filed Vitals:   06/01/14 1539  BP: 111/71  Pulse: 60  Temp: 98.7 F (37.1 C)  Resp: 18   General Examination: The patient is a very pleasant 74 y.o. male in no acute distress. He appears well-developed and well-nourished and well groomed. He is overweight.   HEENT: Normocephalic, atraumatic, pupils are equal, round and reactive to light and accommodation. He has very mild facial puffiness especially under his right eye. He has no rash. He has no signs of urticaria. Extraocular tracking shows mild saccadic breakdown without nystagmus noted. There is no limitation to gaze. There is no apraxia of eyelid opening. He has bilateral cataract repairs. There is mild decrease in eye blink rate. Hearing is intact. Face is symmetric with moderate facial masking and normal facial sensation. There is an intermittent lip and head tremor. This is about the same. Neck is moderately rigid with intact passive ROM. There are no carotid bruits on auscultation. Oropharynx exam reveals no mouth dryness. No significant airway crowding is noted, tonsils are absent, but he does have a floppy appearing soft palate and his uvula reaches far down . Mallampati is class II. Tongue protrudes centrally and palate elevates symmetrically.    Chest: is clear to auscultation without wheezing, rhonchi or crackles noted.  Heart: sounds are irregularly irregular without murmurs, rubs or gallops noted.   Abdomen: is soft, non-tender and non-distended  with normal bowel sounds appreciated on auscultation.  Extremities: There is 1+ edema in the ankles bilaterally, about the same.   Skin: is warm and dry with no trophic changes noted.   Musculoskeletal: exam reveals no obvious joint deformities, tenderness or joint swelling or erythema.  Neurologically:  Mental status: The patient is awake and alert, paying good attention. He is able to to provide a good history. His wife provides some detail oriented to: person, place, time/date, situation, day of week, month of year and year. His memory, attention, language and knowledge are intact. There is no aphasia, agnosia, apraxia or anomia. There is a mild degree of bradyphrenia. Speech is mildly hypophonic with no dysarthria noted. Mood is congruent and affect is normal.  Cranial nerves are as described above under HEENT exam. In addition, shoulder shrug is normal with equal shoulder height noted.  Motor exam: Normal bulk, and strength for age is noted. Tone is mildly rigid with absence of cogwheeling in the bilateral extremities. Tone is increased on the R>L. There is an intermittent right upper extremity tremor at rest, unchanged. There is overall mild bradykinesia. There is no drift or rebound. Romberg is negative. Reflexes are 1+ in the upper extremities and 1+ in the lower extremities. Fine motor skills exam reveals: Finger taps are moderately impaired on the right and  mildly impaired on the left. Hand movements are mildly impaired on the right and mildly impaired on the left. RAP (rapid alternating patting) is mildly impaired on the right and mildly impaired on the left. Foot taps are moderately to severely impaired on the right and moderately impaired on the left. Foot agility (in the form of heel stomping) is mildly impaired on the right and mildly impaired on the left.    Cerebellar testing shows no dysmetria or intention tremor on finger to nose testing. There is no truncal or gait ataxia.    Sensory exam is intact to light touch, temperature and vibration sense in the UEs and LEs.   Gait, station and balance exam: He stands up from the seated position with mild difficulty. No veering to one side is noted. He is not noted to lean to one side. Posture is moderately stooped he is slightly tilted to the left. Stance is  slightly wide based. He walks with decrease in stride length and pace and decreased arm swing on both sides, R nearly absent. He turns in 3 steps. His walk is a little worse from last time. Tandem walk is not possible. Balance is mildly impaired with mild insecurity with turns.   Assessment and Plan:   In summary, Brian [name] is a very pleasant 74 year old male with a history of atrial fibrillation, hypertension, hyperlipidemia, who presents for followup consultation of his right-sided predominant Parkinson's disease, most likely akinetic-rigid type, complicated by anxiety, mild depression, sleep disorder, including sleep fragmentation, nocturia, insomnia, and RBD. He is back on the antidepressant and has actually done well with it. His anxiety has improved. His sleep has improved since the addition of clonazepam. Nevertheless, one of his concerns or not being able to sleep beyond 3 or 4 AM. He felt that the increase in Sinemet to 5 pills daily has helped but he is having a hard time keeping up with a 5 times a day schedule. Today we spent most of my 30 minute visit in counseling and coordination of care. We talked about his sleep disorder, his bladder hyperactivity, his anxiety, his motor dysfunction and mild progression of motor function, and medication management as well as nonpharmacological management. I suggested the following to him today: 1. We will increase your clonazepam to 0.5 mg each night to help you sleep through the night better.  2. We will keep the lexepro at 5 mg at night for anxiety.  3. We will change the Sinemet 25/100 mg to: 1 pill at 7 AM and 7 PM and 1  1/2 pills at 11 AM and 3 PM for a total of 5 pills daily.  4. We will keep the Sinemet CR 50/200 mg at bedtime.  5. Drink more water.  6. Exercise daily, in your own limitation and in moderation.  I answered all their questions today and the patient and his wife were in agreement. He has an appointment with me in June which I asked him to keep. They're encouraged to sign up for the patient portal so they can email as well as call if they need to.

## 2014-07-04 ENCOUNTER — Ambulatory Visit (INDEPENDENT_AMBULATORY_CARE_PROVIDER_SITE_OTHER): Payer: Medicare Other

## 2014-07-04 DIAGNOSIS — I4891 Unspecified atrial fibrillation: Secondary | ICD-10-CM

## 2014-07-04 DIAGNOSIS — Z7901 Long term (current) use of anticoagulants: Secondary | ICD-10-CM

## 2014-07-04 DIAGNOSIS — Z5181 Encounter for therapeutic drug level monitoring: Secondary | ICD-10-CM | POA: Diagnosis not present

## 2014-07-04 LAB — POCT INR: INR: 2.6

## 2014-07-21 ENCOUNTER — Other Ambulatory Visit: Payer: Self-pay | Admitting: *Deleted

## 2014-07-21 DIAGNOSIS — M170 Bilateral primary osteoarthritis of knee: Secondary | ICD-10-CM | POA: Diagnosis not present

## 2014-07-21 MED ORDER — WARFARIN SODIUM 5 MG PO TABS: ORAL_TABLET | ORAL | Status: DC

## 2014-07-21 NOTE — Telephone Encounter (Signed)
Called Pharmacy at [facility] and informed that it is ok to change fron Zydus  Generic Warfarin to Citron generic Warfarin as the manufacturer has changed.. Also ordered refill on Warfarin as requested

## 2014-07-28 DIAGNOSIS — M1712 Unilateral primary osteoarthritis, left knee: Secondary | ICD-10-CM | POA: Diagnosis not present

## 2014-07-28 DIAGNOSIS — M1711 Unilateral primary osteoarthritis, right knee: Secondary | ICD-10-CM | POA: Diagnosis not present

## 2014-08-04 ENCOUNTER — Encounter: Payer: Self-pay | Admitting: Cardiology

## 2014-08-04 DIAGNOSIS — M170 Bilateral primary osteoarthritis of knee: Secondary | ICD-10-CM | POA: Diagnosis not present

## 2014-08-14 DIAGNOSIS — M79674 Pain in right toe(s): Secondary | ICD-10-CM | POA: Diagnosis not present

## 2014-08-14 DIAGNOSIS — B351 Tinea unguium: Secondary | ICD-10-CM | POA: Diagnosis not present

## 2014-08-14 DIAGNOSIS — M79675 Pain in left toe(s): Secondary | ICD-10-CM | POA: Diagnosis not present

## 2014-08-15 ENCOUNTER — Ambulatory Visit (INDEPENDENT_AMBULATORY_CARE_PROVIDER_SITE_OTHER): Payer: Medicare Other | Admitting: *Deleted

## 2014-08-15 DIAGNOSIS — Z5181 Encounter for therapeutic drug level monitoring: Secondary | ICD-10-CM | POA: Diagnosis not present

## 2014-08-15 DIAGNOSIS — I4891 Unspecified atrial fibrillation: Secondary | ICD-10-CM

## 2014-08-15 DIAGNOSIS — Z7901 Long term (current) use of anticoagulants: Secondary | ICD-10-CM

## 2014-08-15 LAB — POCT INR: INR: 2.3

## 2014-08-17 DIAGNOSIS — I10 Essential (primary) hypertension: Secondary | ICD-10-CM | POA: Diagnosis not present

## 2014-08-17 DIAGNOSIS — J309 Allergic rhinitis, unspecified: Secondary | ICD-10-CM | POA: Diagnosis not present

## 2014-08-17 DIAGNOSIS — I482 Chronic atrial fibrillation: Secondary | ICD-10-CM | POA: Diagnosis not present

## 2014-08-22 ENCOUNTER — Ambulatory Visit (INDEPENDENT_AMBULATORY_CARE_PROVIDER_SITE_OTHER): Payer: Medicare Other | Admitting: Neurology

## 2014-08-22 ENCOUNTER — Encounter: Payer: Self-pay | Admitting: Neurology

## 2014-08-22 VITALS — BP 124/62 | HR 66 | Resp 18 | Ht 72.0 in | Wt 250.0 lb

## 2014-08-22 DIAGNOSIS — M25462 Effusion, left knee: Secondary | ICD-10-CM

## 2014-08-22 DIAGNOSIS — G4752 REM sleep behavior disorder: Secondary | ICD-10-CM

## 2014-08-22 DIAGNOSIS — I482 Chronic atrial fibrillation, unspecified: Secondary | ICD-10-CM

## 2014-08-22 DIAGNOSIS — M25562 Pain in left knee: Secondary | ICD-10-CM

## 2014-08-22 DIAGNOSIS — R600 Localized edema: Secondary | ICD-10-CM

## 2014-08-22 DIAGNOSIS — F419 Anxiety disorder, unspecified: Secondary | ICD-10-CM | POA: Diagnosis not present

## 2014-08-22 DIAGNOSIS — M25561 Pain in right knee: Secondary | ICD-10-CM | POA: Diagnosis not present

## 2014-08-22 DIAGNOSIS — G20 Parkinson's disease: Secondary | ICD-10-CM | POA: Diagnosis not present

## 2014-08-22 MED ORDER — CARBIDOPA-LEVODOPA 25-100 MG PO TABS: 1.5000 | ORAL_TABLET | Freq: Four times a day (QID) | ORAL | Status: DC

## 2014-08-22 NOTE — Progress Notes (Signed)
Subjective:    Patient ID: Brian [name] is a 74 y.o. male.  HPI     Interim history:   Brian [name] is a 74 year old right-handed gentleman with an underlying medical history of atrial fibrillation, hypertension, OSA, and hyperlipidemia who presents for followup consultation of his right-sided predominant Parkinson's disease complicated by bladder hyperactivity, anxiety and RBD. He is unaccompanied today. I last saw him on 06/01/2014, at which time he reported overall doing well. He was not able to sleep through the night. He was consistently waking up between 3 and 4 AM. He had nocturia twice on average. He felt improved from the bladder medication but still reported disrupted sleep. He felt clonazepam has helped. His wife agreed. He also felt that the increase in Sinemet was helpful in his motor function. It was difficult for him to keep up with the 5 pills a day schedule. He was going to the [facility] 3 times a week for about 30 minutes. Lexapro has helped his mood. His wife felt that he was less anxious. I suggested we increase his clonazepam to 0.5 mg each night. I suggested we change his Sinemet to 1 pill alternating with one half pills for a total of 5 pills daily but for doses. I suggested he continue with Sinemet CR at bedtime.  Today, 08/22/2014: He reports bilateral knee pain. He has seen Dr. Tonita Cong for his knee arthritis. He has received injections in both knees. He did not think the injections helped. He has more difficulty getting out of chairs. He has more difficulty with freezing. He does feel that the increase in Sinemet had helped in the past, and currently he is alternating 1 pill with 1-1/2 pills. He takes the CR at night. He continues to take gabapentin 100 mg 3 times a day and Lexapro 5 mg daily as well as clonazepam 0.5 mg each night. He has no new memory or mood issues. He's moving slower. Thankfully he has not fallen recently. He may need knee replacement surgery.  Previously:   I  saw him on 03/27/2014, at which time he reported sleeping a little bit better but his tremor was worse. He did not feel Sinemet was lasting him 4 hours in between 2 different doses. He was going to the gym twice a week. He was back on an antidepressant. He was supposed to start a new bladder medication. He never actually started clonazepam for RBD. I prescribed clonazepam for him for RBD. I increased his Sinemet to one fill 5 times a day.   I saw him on 12/01/2013, at which time he reported, that he stopped Myrbetric due to new onset facial swelling, but this was no better after stopping it. His facial swelling may have started after the lexapro. Adding long-acting Sinemet helped him sleep a little bit longer up to 3 AM. I suggested he stop Lexapro because of a possible allergic reaction to it. I also added low-dose clonazepam for his RBD and for his sleep.  I saw him on 09/29/2013 at which time he presented for a sooner than scheduled appointment because of increasing tremors and parkinsonian symptoms. He also felt more anxious but not frankly depressed. His wife felt that he may have been depressed. She reported dream enactments. She also reported loud snoring. He had a sleep study over 5 years ago, and that was negative for OSA at the time. He had started Myrbetriq recently. He has been taking gabapentin 3 times a day, and 7, 11 and 3.  He takes Sinemet regularly at 7, 11, 3, and 7. I suggested that he start low-dose Lexapro at 5 mg strength. I changed the timing of his gabapentin to one pill at 7 AM, 1 pill at 11 AM and one pill at 7 PM. I asked him to continue with Sinemet 4 times a day. I asked him to add a long-acting Sinemet CR 50/200 mg strength once daily at bed time. I suggested he return for a sleep study. He had a diagnostic polysomnogram on 11/09/2013 and I went over his test results with him in detail today. Sleep efficiency was reduced at 58.7% with a latency to sleep of 82 minutes and wake after  sleep onset of 117 minutes with mild to moderate sleep fragmentation noted. He had an increased percentage of light stage sleep, absence of deep sleep and 16.8% of dream sleep with a prolonged REM latency. He had mild to moderate snoring. Total AHI was 3.2 per hour, he had some lack of REM atonia.   I saw him on 05/31/2013, at which time I felt he was fairly stable. I kept him on Sinemet 4 times a day and gabapentin 100 mg 3 times a day. He reported, going up to 4 times a day with his Sinemet helped. He reported no side effects with gabapentin or Sinemet and seemed to tolerate them well. He had some residual facial tingling which was tolerable to him. He has LBP and went to [facility]. He had an Xray of the back and was told there was degenerative disease and was given and was given a 3 week taper of oral steroids, which helped. He had stopped exercising d/t back pain and was scheduled for PT evaluation through ortho. He has been on metamucil for constipation which helped.   I saw him on 11/30/2012, at which time I suggested that he continue gabapentin 100 mg 1 pill 3 times a day for his paresthesias and encouraged him to continue Sinemet with 25/100 mg strength one tablet 4 times a day, at 7, 11, 3 PM and 7 PM.   I first met him on 06/22/2012 and he presented on 08/23/2012 for a sooner than scheduled appointment because he felt his medication was not helpful. He previously followed with Dr. Jeneen Rinks love and had been complaining of paresthesias. Dr. Erling Cruz had started him on low-dose gabapentin. For his parkinsonism which was first noted in June of 2012 he was tried on pramipexole which helped, but at his first visit with me he told me that his gabapentin was not helpful and he also discontinued pramipexole a few months prior because of swelling in his feet and ankles. This improved after stopping both medications. At the time of his first visit with me I felt he had mild parkinsonism without much in the way of  lateralization. I started him on gabapentin again because of his paresthesias and also asked him to start low-dose Sinemet. He called back stating that the Sinemet was not helpful. He requested a sooner appointment. He had been continuing taking gabapentin 100 mg 3 times a day and Sinemet one pill 3 times a day with minimal improvement as he reported last time. I suggested at that visit that he increase his Sinemet to one pill 4 times a day and continue with gabapentin 100 mg 3 times a day. I also felt that he may have right sided predominant idiopathic Parkinson's disease due to an intermittent tremor noted only on the right side.   His Past  Medical History Is Significant For: Past Medical History  Diagnosis Date  . Dyslipidemia   . Mitral regurgitation     Mild  . Abnormal PFT 1. 05/18/08  2. 11/30/08    1. Showed mild airflow obstruction, mild restriction, mild diffusion defect; FEV1 2.22(64%), FVC 3.33(65%), FEVi% 67, TLC 5.19(69%), DLCO 77%, +BD  2. FEV1 2.38(73%), FVC 3.81(80%), FEV1% 63, TLC 5.61(80%), DLCO 79%, no BD  . Parkinsonism 06/22/2012  . Paresthesias 06/22/2012  . Hyperlipidemia   . GERD (gastroesophageal reflux disease)   . Erectile dysfunction   . BPH (benign prostatic hyperplasia)   . Restrictive lung disease   . CKD (chronic kidney disease) stage 2, GFR 60-89 ml/min   . Cellulitis     right leg MRSA  . Osteoarthritis   . Allergic rhinitis   . Anxiety   . Hypertension   . Atrial fibrillation     persistent afib    His Past Surgical History Is Significant For: Past Surgical History  Procedure Laterality Date  . Cataract extraction    . Cardioversion      multiple  . Cardiac catheterization  11/2009    normal coronary arteries    His Family History Is Significant For: Family History  Problem Relation Age of Onset  . Diabetes Mother     DM  . Stroke Father     CVA  . Pancreatic cancer Brother   . Pancreatic cancer Other     Nephew    His Social History Is  Significant For: History   Social History  . Marital Status: Married    Spouse Name: [name]  . Number of Children: 4  . Years of Education: masters   Occupational History  . Benefit analyst     Worked with [employer]   Social History Main Topics  . Smoking status: Never Smoker   . Smokeless tobacco: Never Used  . Alcohol Use: No     Comment: occasionally  . Drug Use: No  . Sexual Activity: Not on file   Other Topics Concern  . None   Social History Narrative   Married and lives in [place].  Retired Optometrist.   No reported caffeine use.     His Allergies Are:  Allergies  Allergen Reactions  . Lisinopril Other (See Comments)    coughing  :   His Current Medications Are:  Outpatient Encounter Prescriptions as of 08/22/2014  Medication Sig  . amLODipine (NORVASC) 10 MG tablet TAKE ONE TABLET BY MOUTH ONCE DAILY.  Marland Kitchen amLODipine (NORVASC) 10 MG tablet TAKE ONE TABLET BY MOUTH ONCE DAILY  . BENICAR 40 MG tablet TAKE ONE TABLET BY MOUTH ONCE DAILY  . BYSTOLIC 10 MG tablet   . carbidopa-levodopa (SINEMET CR) 50-200 MG per tablet Take 1 tablet by mouth at bedtime.  . carbidopa-levodopa (SINEMET IR) 25-100 MG per tablet Take 1 tablet by mouth 5 (five) times daily. 7, 10., 1PM, 4PM, 7PM  . Cholecalciferol (VITAMIN D3) 1000 UNITS CAPS Take by mouth daily.    . clonazePAM (KLONOPIN) 0.5 MG disintegrating tablet Take 1 tablet (0.5 mg total) by mouth at bedtime.  Marland Kitchen doxazosin (CARDURA) 2 MG tablet TAKE THREE TABLETS BY MOUTH ONCE DAILY.  Marland Kitchen escitalopram (LEXAPRO) 5 MG tablet Take 1 tablet (5 mg total) by mouth daily.  . famotidine (PEPCID) 40 MG tablet Take 40 mg by mouth daily.   . furosemide (LASIX) 20 MG tablet Take 20 mg by mouth as needed.   . gabapentin (NEURONTIN) 100  MG capsule Take 1 capsule (100 mg total) by mouth 3 (three) times daily.  Marland Kitchen ketoconazole (NIZORAL) 2 % cream Apply 2 application topically 2 (two) times a week.  Marland Kitchen KLOR-CON M10 10 MEQ tablet Take 1 tablet  by mouth daily.  . Multiple Vitamins-Minerals (CENTRUM SILVER PO) Take by mouth daily.    . nebivolol (BYSTOLIC) 5 MG tablet Take 10 mg by mouth daily.   Marland Kitchen oxybutynin (DITROPAN) 5 MG tablet   . pravastatin (PRAVACHOL) 40 MG tablet Take 40 mg by mouth daily.   Marland Kitchen warfarin (COUMADIN) 5 MG tablet Take as directed by  Coumadin clinic   No facility-administered encounter medications on file as of 08/22/2014.  :  Review of Systems:  Out of a complete 14 point review of systems, all are reviewed and negative with the exception of these symptoms as listed below:   Review of Systems  All other systems reviewed and are negative.  Knee pain, left more than right. Swelling both legs.  More freezing.   Objective:  Neurologic Exam  Physical Exam Physical Examination:   Filed Vitals:   08/22/14 1545  BP: 124/62  Pulse: 66  Resp: 18   General Examination: The patient is a very pleasant 74 y.o. male in no acute distress. He appears well-developed and well-nourished and well groomed. He is overweight.   HEENT: Normocephalic, atraumatic, pupils are equal, round and reactive to light and accommodation. He has very mild facial puffiness especially under his right eye. He has no rash. He has no signs of urticaria. Extraocular tracking shows mild saccadic breakdown without nystagmus noted. There is no limitation to gaze. There is no apraxia of eyelid opening. He has bilateral cataract repairs. There is mild decrease in eye blink rate. Hearing is intact. Face is symmetric with moderate facial masking and normal facial sensation. There is an intermittent lip and head tremor. This is about the same. Neck is moderately rigid with intact passive ROM. There are no carotid bruits on auscultation. Oropharynx exam reveals no mouth dryness. No significant airway crowding is noted, tonsils are absent, but he does have a floppy appearing soft palate and his uvula reaches far down . Mallampati is class II. Tongue  protrudes centrally and palate elevates symmetrically.    Chest: is clear to auscultation without wheezing, rhonchi or crackles noted.  Heart: sounds are irregularly irregular without murmurs, rubs or gallops noted.   Abdomen: is soft, non-tender and non-distended with normal bowel sounds appreciated on auscultation.  Extremities: There is 1-2+ edema in the ankles and above bilaterally, worse than last time.    Skin: is warm and dry with no trophic changes noted. Chronic stasis like changes are seen in the distal legs bilateral.   Musculoskeletal: exam reveals no obvious joint deformities, tenderness or joint swelling or erythema, except for bilateral knee swelling, L>R and mild warmth at the lateral aspect.   Neurologically:   Mental status: The patient is awake and alert, paying good attention. He is able to to provide a good history. His wife provides some detail oriented to: person, place, time/date, situation, day of week, month of year and year. His memory, attention, language and knowledge are intact. There is no aphasia, agnosia, apraxia or anomia. There is a mild degree of bradyphrenia. Speech is mildly hypophonic with no dysarthria noted. Mood is congruent and affect is normal.  Cranial nerves are as described above under HEENT exam. In addition, shoulder shrug is normal with equal shoulder height noted.  Motor  exam: Normal bulk, and strength for age is noted. Tone is mild to moderately rigid with absence of cogwheeling in the bilateral extremities. Tone is increased on the R>L. There is an intermittent right upper extremity tremor at rest, unchanged. There is overall moderate bradykinesia. There is no drift or rebound. Romberg is negative. Reflexes are 1+ in the upper extremities and 1+ in the lower extremities. Fine motor skills exam reveals: Finger taps are moderately impaired on the right and mildly impaired on the left. Hand movements are mildly impaired on the right and mildly  impaired on the left. RAP (rapid alternating patting) is mildly impaired on the right and mildly impaired on the left. Foot taps are moderately to severely impaired on the right and moderately impaired on the left. Foot agility (in the form of heel stomping) is mildly impaired on the right and mildly impaired on the left.    Cerebellar testing shows no dysmetria or intention tremor on finger to nose testing. There is no truncal or gait ataxia.   Sensory exam is intact to light touch, temperature and vibration sense in the UEs and LEs.   Gait, station and balance exam: He stands up from the seated position with mild difficulty. No veering to one side is noted. He is not noted to lean to one side. Posture is moderately stooped he is slightly tilted to the left. Stance is  slightly wide based. He walks with decrease in stride length and pace and decreased arm swing on both sides, R nearly absent. He turns in 3 steps. His walk and posture are a little worse from last time. Tandem walk is not possible. Balance is mildly impaired with mild insecurity with turns.   Assessment and Plan:   In summary, Brian [name] is a very pleasant 74 year old male with a history of atrial fibrillation, hypertension, hyperlipidemia, who presents for followup consultation of his right-sided predominant Parkinson's disease, most likely akinetic-rigid type, complicated by anxiety, mild depression, sleep disorder, including sleep fragmentation, nocturia, insomnia, and RBD. He is now on lexapro low dose and has actually done well with it. His anxiety has improved. His sleep has improved since the addition of clonazepam. His exam is a little worse today. I suggested the following to him today:  1. We will keep the clonazepam at 0.5 mg each night to help you sleep through the night and reduce dream enactments.   2. We will keep the lexepro at 5 mg at night for anxiety.  3. We will increase the Sinemet 25/100 mg to: 1 1/2 pills at  all 4 dose times: 7 AM, 11 AM, 3 PM and 7 PM for a total of 6 pills daily.  4. We will keep the Sinemet CR 50/200 mg at bedtime.  5. Drink more water.  6. Exercise daily, in your own limitation. 7. If you indeed need joint replacement surgery, please be advised that because of your Parkinson's disease you may take longer to recover and temporarily note a worsening of your tremor/stiffness/slowness/balance issues and/or freezing.  8. We will keep the gabapentin at 100 mg three times a day.   I answered all his questions today and the patient was in agreement. He is advised to follow up in aobut 4 months and call for an update on his left knee replacement surgery. I spent 25 minutes in total face-to-face time with the patient, more than 50% of which was spent in counseling and coordination of care, reviewing test results, reviewing medication  and discussing or reviewing the diagnosis of PD, its prognosis and treatment options.

## 2014-08-22 NOTE — Patient Instructions (Signed)
  1. We will keep the clonazepam at 0.5 mg each night to help you sleep through the night and reduce dream enactments.   2. We will keep the lexepro at 5 mg at night for anxiety.  3. We will increase the Sinemet 25/100 mg to: 1 1/2 pills at all 4 dose times: 7 AM, 11 AM, 3 PM and 7 PM for a total of 6 pills daily.  4. We will keep the Sinemet CR 50/200 mg at bedtime.  5. Drink more water.  6. Exercise daily, in your own limitation. 7. If you indeed need joint replacement surgery, please be advised that because of your Parkinson's disease you may take longer to recover and temporarily note a worsening of your tremor/stiffness/slowness/balance issues and/or freezing.  8. We will keep the gabapentin at 100 mg three times a day.

## 2014-08-28 ENCOUNTER — Ambulatory Visit: Payer: Medicare Other | Admitting: Neurology

## 2014-08-31 NOTE — Progress Notes (Signed)
Cardiology Office Note   Date:  09/01/2014   ID:  Brian [name], DOB [birth year], MRN [id number]  PCP:  Wenda Low, MD    No chief complaint on file.     History of Present Illness: Brian [name] is a 74 y.o. male with chronic atrial fibrillation, chronic anticoagulation, chronic LE edema and HTN who presents today for followup. He is doing well. He denies any chest pain, SOB, DOE, LE edema, dizziness, palpitations or syncope. His LE edema has resolved with TED hose compression stockings.  His exercise is limited by knee pain.   Past Medical History  Diagnosis Date  . Dyslipidemia   . Mitral regurgitation     Mild  . Abnormal PFT 1. 05/18/08  2. 11/30/08    1. Showed mild airflow obstruction, mild restriction, mild diffusion defect; FEV1 2.22(64%), FVC 3.33(65%), FEVi% 67, TLC 5.19(69%), DLCO 77%, +BD  2. FEV1 2.38(73%), FVC 3.81(80%), FEV1% 63, TLC 5.61(80%), DLCO 79%, no BD  . Parkinsonism 06/22/2012  . Paresthesias 06/22/2012  . Hyperlipidemia   . GERD (gastroesophageal reflux disease)   . Erectile dysfunction   . BPH (benign prostatic hyperplasia)   . Restrictive lung disease   . CKD (chronic kidney disease) stage 2, GFR 60-89 ml/min   . Cellulitis     right leg MRSA  . Osteoarthritis   . Allergic rhinitis   . Anxiety   . Hypertension   . Atrial fibrillation     persistent afib    Past Surgical History  Procedure Laterality Date  . Cataract extraction    . Cardioversion      multiple  . Cardiac catheterization  11/2009    normal coronary arteries     Current Outpatient Prescriptions  Medication Sig Dispense Refill  . amLODipine (NORVASC) 10 MG tablet TAKE ONE TABLET BY MOUTH ONCE DAILY. 30 tablet 0  . amLODipine (NORVASC) 10 MG tablet TAKE ONE TABLET BY MOUTH ONCE DAILY 30 tablet 4  . BENICAR 40 MG tablet TAKE ONE TABLET BY MOUTH ONCE DAILY 30 tablet 4  . BYSTOLIC 10 MG tablet Take 10 mg by mouth daily.     . carbidopa-levodopa (SINEMET  CR) 50-200 MG per tablet Take 1 tablet by mouth at bedtime. 90 tablet 3  . carbidopa-levodopa (SINEMET IR) 25-100 MG per tablet Take 1.5 tablets by mouth 4 (four) times daily. 7 am, 11 am, 3 pm and 7 pm 540 tablet 3  . Cholecalciferol (VITAMIN D3) 1000 UNITS CAPS Take 1,000 Units by mouth daily.     . clonazePAM (KLONOPIN) 0.5 MG disintegrating tablet Take 1 tablet (0.5 mg total) by mouth at bedtime. 90 tablet 3  . doxazosin (CARDURA) 2 MG tablet TAKE THREE TABLETS BY MOUTH ONCE DAILY. 90 tablet 0  . escitalopram (LEXAPRO) 5 MG tablet Take 1 tablet (5 mg total) by mouth daily. 90 tablet 3  . famotidine (PEPCID) 40 MG tablet Take 40 mg by mouth daily.     . furosemide (LASIX) 20 MG tablet Take 20 mg by mouth as needed (edema).     . gabapentin (NEURONTIN) 100 MG capsule Take 1 capsule (100 mg total) by mouth 3 (three) times daily. 270 capsule 3  . ketoconazole (NIZORAL) 2 % cream Apply 2 application topically 2 (two) times a week.    Marland Kitchen KLOR-CON M10 10 MEQ tablet Take 1 tablet by mouth daily.    . Multiple Vitamins-Minerals (  CENTRUM SILVER PO) Take 1 tablet by mouth daily.     . nebivolol (BYSTOLIC) 5 MG tablet Take 10 mg by mouth daily.     Marland Kitchen oxybutynin (DITROPAN) 5 MG tablet Take 5 mg by mouth daily.     . pravastatin (PRAVACHOL) 40 MG tablet Take 40 mg by mouth daily.     Marland Kitchen warfarin (COUMADIN) 5 MG tablet Take as directed by  Coumadin clinic 135 tablet 1   No current facility-administered medications for this visit.    Allergies:   Lisinopril    Social History:  The patient  reports that he has never smoked. He has never used smokeless tobacco. He reports that he does not drink alcohol or use illicit drugs.   Family History:  The patient's family history includes Diabetes in his mother; Pancreatic cancer in his brother and other; Stroke in his father.    ROS:  Please see the history of present illness.   Otherwise, review of systems are positive for none.   All other systems are reviewed  and negative.    PHYSICAL EXAM: VS:  BP 122/60 mmHg  Pulse 51  Ht 6\' 2"  (1.88 m)  Wt 249 lb 1.9 oz (113 kg)  BMI 31.97 kg/m2 , BMI Body mass index is 31.97 kg/(m^2). GEN: Well nourished, well developed, in no acute distress HEENT: normal Neck: no JVD, carotid bruits, or masses Cardiac: irregularly irregular; no murmurs, rubs, or gallops,no edema  Respiratory:  clear to auscultation bilaterally, normal work of breathing GI: soft, nontender, nondistended, + BS MS: no deformity or atrophy Skin: warm and dry, no rash Neuro:  Strength and sensation are intact Psych: euthymic mood, full affect   EKG:  EKG is not ordered today.    Recent Labs: No results found for requested labs within last 365 days.    Lipid Panel No results found for: CHOL, TRIG, HDL, CHOLHDL, VLDL, LDLCALC, LDLDIRECT    Wt Readings from Last 3 Encounters:  09/01/14 249 lb 1.9 oz (113 kg)  08/22/14 250 lb (113.399 kg)  06/01/14 232 lb (105.235 kg)    ASSESSMENT AND PLAN:  1. Chronic atrial fibrillation rated controlled - continue Bystolic/Warfarin 2. HTN - controlled - Continue amlodipine/Olmesartan/doxazosin/Bystolic 3. Chronic LE edema - resolved - continue Lasix 4. Chronic systemic anticoagulation    Current medicines are reviewed at length with the patient today.  The patient does not have concerns regarding medicines.  The following changes have been made:  no change  Labs/ tests ordered today: See above Assessment and Plan No orders of the defined types were placed in this encounter.     Disposition:   FU with me in 1 year  Signed, Sueanne Margarita, MD  09/01/2014 8:06 AM    [facility] [place], [place], [place]  [address] Phone: [phone]; Fax: [phone]

## 2014-09-01 ENCOUNTER — Ambulatory Visit (INDEPENDENT_AMBULATORY_CARE_PROVIDER_SITE_OTHER): Payer: Medicare Other | Admitting: Cardiology

## 2014-09-01 ENCOUNTER — Encounter: Payer: Self-pay | Admitting: Cardiology

## 2014-09-01 VITALS — BP 122/60 | HR 51 | Ht 74.0 in | Wt 249.1 lb

## 2014-09-01 DIAGNOSIS — I482 Chronic atrial fibrillation, unspecified: Secondary | ICD-10-CM

## 2014-09-01 DIAGNOSIS — I10 Essential (primary) hypertension: Secondary | ICD-10-CM

## 2014-09-01 DIAGNOSIS — R600 Localized edema: Secondary | ICD-10-CM

## 2014-09-01 DIAGNOSIS — R609 Edema, unspecified: Secondary | ICD-10-CM

## 2014-09-01 NOTE — Patient Instructions (Signed)

## 2014-09-13 DIAGNOSIS — M1711 Unilateral primary osteoarthritis, right knee: Secondary | ICD-10-CM | POA: Diagnosis not present

## 2014-09-13 DIAGNOSIS — M1712 Unilateral primary osteoarthritis, left knee: Secondary | ICD-10-CM | POA: Diagnosis not present

## 2014-09-13 DIAGNOSIS — R351 Nocturia: Secondary | ICD-10-CM | POA: Diagnosis not present

## 2014-09-13 DIAGNOSIS — N401 Enlarged prostate with lower urinary tract symptoms: Secondary | ICD-10-CM | POA: Diagnosis not present

## 2014-09-13 DIAGNOSIS — R350 Frequency of micturition: Secondary | ICD-10-CM | POA: Diagnosis not present

## 2014-09-26 ENCOUNTER — Ambulatory Visit (INDEPENDENT_AMBULATORY_CARE_PROVIDER_SITE_OTHER): Payer: Medicare Other | Admitting: *Deleted

## 2014-09-26 ENCOUNTER — Telehealth: Payer: Self-pay | Admitting: *Deleted

## 2014-09-26 DIAGNOSIS — I4891 Unspecified atrial fibrillation: Secondary | ICD-10-CM

## 2014-09-26 DIAGNOSIS — I482 Chronic atrial fibrillation, unspecified: Secondary | ICD-10-CM

## 2014-09-26 DIAGNOSIS — Z7901 Long term (current) use of anticoagulants: Secondary | ICD-10-CM | POA: Diagnosis not present

## 2014-09-26 DIAGNOSIS — Z5181 Encounter for therapeutic drug level monitoring: Secondary | ICD-10-CM | POA: Diagnosis not present

## 2014-09-26 LAB — POCT INR
INR: 3.3
INR: 3.3

## 2014-09-26 NOTE — Telephone Encounter (Addendum)
Patient stated that he is having a Left Knee Replacement in July or early August.  Called Dr. Reather Littler office and left a message for Judeen Hammans (scheduler) for information on upcoming procedure.  Also, left our fax/office number in the [facility] clinic for follow-up and to fax clearance forms once a scheduled date has been set.  Advised patient in the office to keep [place] updated with procedure date and any other information. He verbalized understanding.

## 2014-09-29 NOTE — Telephone Encounter (Signed)
Received faxed paperwork from Dr. Jenne Campus office, stating the procedure has not been scheduled to date and they will keep [place] informed.

## 2014-10-03 DIAGNOSIS — R350 Frequency of micturition: Secondary | ICD-10-CM | POA: Diagnosis not present

## 2014-10-03 DIAGNOSIS — N401 Enlarged prostate with lower urinary tract symptoms: Secondary | ICD-10-CM | POA: Diagnosis not present

## 2014-10-09 ENCOUNTER — Telehealth: Payer: Self-pay | Admitting: Neurology

## 2014-10-09 NOTE — Telephone Encounter (Signed)
Patients wife called and requested to speak with the nurse regarding a medical release form that is needed for the patient to have surgery. Please call and advise.

## 2014-10-09 NOTE — Telephone Encounter (Signed)
I spoke to wife. She asked if we had received a form from [place] releasing patient for surgery. I do not see record of [place] getting one or faxing one off. I called [facility] and left a message to have them refax that to [place].

## 2014-10-09 NOTE — Telephone Encounter (Signed)
Barbara(wife) returned Diana's call.

## 2014-10-09 NOTE — Telephone Encounter (Signed)
Left message to call back  

## 2014-10-11 ENCOUNTER — Telehealth: Payer: Self-pay

## 2014-10-11 ENCOUNTER — Telehealth: Payer: Self-pay | Admitting: Neurology

## 2014-10-11 NOTE — Telephone Encounter (Signed)
error 

## 2014-10-11 NOTE — Telephone Encounter (Signed)
I spoke to wife to f/u on prior call about consent for surgery. I explained to her that I called  Orth[facility] to have them refax consent form to [place] but I had never received it. She states that they report that they now have the signed consent form from [place] but need it from PCP. She will call back if she needs anything else.

## 2014-10-19 DIAGNOSIS — I10 Essential (primary) hypertension: Secondary | ICD-10-CM | POA: Diagnosis not present

## 2014-10-19 DIAGNOSIS — G20 Parkinson's disease: Secondary | ICD-10-CM | POA: Diagnosis not present

## 2014-10-19 DIAGNOSIS — M179 Osteoarthritis of knee, unspecified: Secondary | ICD-10-CM | POA: Diagnosis not present

## 2014-10-19 DIAGNOSIS — Z01818 Encounter for other preprocedural examination: Secondary | ICD-10-CM | POA: Diagnosis not present

## 2014-10-19 DIAGNOSIS — I482 Chronic atrial fibrillation: Secondary | ICD-10-CM | POA: Diagnosis not present

## 2014-10-19 DIAGNOSIS — K219 Gastro-esophageal reflux disease without esophagitis: Secondary | ICD-10-CM | POA: Diagnosis not present

## 2014-10-24 ENCOUNTER — Ambulatory Visit (INDEPENDENT_AMBULATORY_CARE_PROVIDER_SITE_OTHER): Payer: Medicare Other | Admitting: *Deleted

## 2014-10-24 DIAGNOSIS — Z5181 Encounter for therapeutic drug level monitoring: Secondary | ICD-10-CM

## 2014-10-24 DIAGNOSIS — Z7901 Long term (current) use of anticoagulants: Secondary | ICD-10-CM

## 2014-10-24 DIAGNOSIS — I4891 Unspecified atrial fibrillation: Secondary | ICD-10-CM

## 2014-10-24 DIAGNOSIS — I482 Chronic atrial fibrillation, unspecified: Secondary | ICD-10-CM

## 2014-10-24 LAB — POCT INR: INR: 2.3

## 2014-10-25 ENCOUNTER — Encounter: Payer: Self-pay | Admitting: Podiatry

## 2014-10-25 ENCOUNTER — Ambulatory Visit: Payer: Self-pay | Admitting: Orthopedic Surgery

## 2014-10-25 ENCOUNTER — Ambulatory Visit (INDEPENDENT_AMBULATORY_CARE_PROVIDER_SITE_OTHER): Payer: Medicare Other | Admitting: Podiatry

## 2014-10-25 VITALS — BP 133/84 | HR 76 | Resp 18

## 2014-10-25 DIAGNOSIS — M79676 Pain in unspecified toe(s): Secondary | ICD-10-CM | POA: Diagnosis not present

## 2014-10-25 DIAGNOSIS — B351 Tinea unguium: Secondary | ICD-10-CM | POA: Diagnosis not present

## 2014-10-25 NOTE — Progress Notes (Signed)
Patient ID: Brian [name], male   DOB: [birth year], 74 y.o.   MRN: [id number] Complaint:  Visit Type: Patient returns to my office for continued preventative foot care services. Complaint: Patient states" my nails have grown long and thick and become painful to walk and wear shoes" . The patient presents for preventative foot care services. No changes to ROS  Podiatric Exam: Vascular: dorsalis pedis and posterior tibial pulses are palpable bilateral. Capillary return is immediate. Temperature gradient is WNL. Skin turgor WNL  Sensorium: Normal Semmes Weinstein monofilament test. Normal tactile sensation bilaterally. Nail Exam: Pt has thick disfigured discolored nails with subungual debris noted bilateral entire nail hallux through fifth toenails Ulcer Exam: There is no evidence of ulcer or pre-ulcerative changes or infection. Orthopedic Exam: Muscle tone and strength are WNL. No limitations in general ROM. No crepitus or effusions noted. Foot type and digits show no abnormalities. Bony prominences are unremarkable. Skin: No Porokeratosis. No infection or ulcers  Diagnosis:  Onychomycosis, , Pain in right toe, pain in left toes  Treatment & Plan Procedures and Treatment: Consent by patient was obtained for treatment procedures. The patient understood the discussion of treatment and procedures well. All questions were answered thoroughly reviewed. Debridement of mycotic and hypertrophic toenails, 1 through 5 bilateral and clearing of subungual debris. No ulceration, no infection noted.  Return Visit-Office Procedure: Patient instructed to return to the office for a follow up visit 3 months for continued evaluation and treatment.

## 2014-11-03 DIAGNOSIS — M1712 Unilateral primary osteoarthritis, left knee: Secondary | ICD-10-CM | POA: Diagnosis not present

## 2014-11-21 ENCOUNTER — Ambulatory Visit (INDEPENDENT_AMBULATORY_CARE_PROVIDER_SITE_OTHER): Payer: Medicare Other

## 2014-11-21 ENCOUNTER — Telehealth: Payer: Self-pay | Admitting: Neurology

## 2014-11-21 ENCOUNTER — Ambulatory Visit: Payer: Self-pay | Admitting: Orthopedic Surgery

## 2014-11-21 DIAGNOSIS — Z5181 Encounter for therapeutic drug level monitoring: Secondary | ICD-10-CM | POA: Diagnosis not present

## 2014-11-21 DIAGNOSIS — I482 Chronic atrial fibrillation, unspecified: Secondary | ICD-10-CM

## 2014-11-21 DIAGNOSIS — I4891 Unspecified atrial fibrillation: Secondary | ICD-10-CM

## 2014-11-21 DIAGNOSIS — Z7901 Long term (current) use of anticoagulants: Secondary | ICD-10-CM

## 2014-11-21 LAB — POCT INR: INR: 2.7

## 2014-11-21 NOTE — Telephone Encounter (Signed)
Kennyth Lose with [employer] states that Dr. Tonita Cong is doing surgery for this patient next week. She is asking if any of the doctors from our practice would like to round for this patient or do we have suggestions. Also, the nurse is asking if they should continue current dosages of medication or if anything different perioperatively should be administered.  Are there certain medications to avoid. Please call Kennyth Lose.  Thanks!

## 2014-11-21 NOTE — H&P (Signed)
Brian [name] DOB: 09-28-1940 Married / Language: English / Race: Black or African American Male  H&P Date: 11/21/14  Chief complaint: Left knee pain  History of Present Illness The patient is a 74 year old male who comes in today for a preoperative History and Physical. The patient is scheduled for a left total knee arthroplasty to be performed by Dr. Johnn Hai, MD at [facility] on 11/30/2014. [name] reports disabling left knee pain refractory to steroid injections, viscosupplementation, bracing, activity modifications, home exercise program. He reports pain interfering with ADL's and he desires to proceed with surgical intervention at this point. He has been cleared by cardiology and told to hold his coumadin x 5 days pre-op (on for A.Fib). He will not require bridging with any other anticoagulation pre-op per [place] coumadin clinic. He has Parkinson's, has been cleared by his neurologist with recommendation to continue current medications.  Dr. Tonita Cong and the patient mutually agreed to proceed with a total knee replacement. Risks and benefits of the procedure were discussed including stiffness, suboptimal range of motion, persistent pain, infection requiring removal of prosthesis and reinsertion, need for prophylactic antibiotics in the future, for example, dental procedures, possible need for manipulation, revision in the future and also anesthetic complications including DVT, PE, etc. We discussed the perioperative course, time in the hospital, postoperative recovery and the need for elevation to control swelling. We also discussed the predicted range of motion and the probability that squatting and kneeling would be unobtainable in the future. In addition, postoperative anticoagulation was discussed. We have obtained preoperative medical clearance as necessary. Provided illustrated handout and discussed it in detail. They will enroll in the total joint replacement educational forum  at the hospital.  Allergies No Known Drug Allergies01/10/2011  Family History Cerebrovascular Accident father First Degree Relatives Cancer brother Hypertension father Diabetes Mellitus mother  Social History Tobacco use Never smoker.  Medication History Carbidopa-Levodopa (Oral) Specific dose unknown - Active. (CR) Carbidopa (Oral) Specific dose unknown - Active. (4x per day) Escitalopram Oxalate (Oral) Specific dose unknown - Active. ClonazePAM (Oral) Specific dose unknown - Active. Furosemide (Oral) Specific dose unknown - Active. Doxazosin Mesylate (2MG  Tablet, Oral three times daily) Active. Bystolic (10MG  Tablet, Oral daily) Active. Pravastatin Sodium (40MG  Tablet, Oral daily) Active. Warfarin Sodium (5MG  Tablet, Oral daily) Active. Gabapentin (100MG  Capsule, Oral three times daily) Active. Azor (10-40MG  Tablet, Oral) Active. Centrum Silver (Oral) Active. Vitamin D3 (Oral) Specific dose unknown - Active. Medications Reconciled  Past Surgical History No pertinent past surgical history  Past Medical Hx High blood pressure Cardiac Arrhythmia  Hypercholesterolemia Primary localized osteoarthritis of left knee (M17.12) Hip pain, left (M25.552) Cataract Dentures Atrial Fibrillation Parkinson's Disease  Review of Systems General Not Present- Chills, Fatigue, Fever, Memory Loss, Night Sweats, Weight Gain and Weight Loss. Skin Not Present- Eczema, Hives, Itching, Lesions and Rash. HEENT Present- Dentures. Not Present- Double Vision, Headache, Hearing Loss, Tinnitus and Visual Loss. Respiratory Not Present- Allergies, Chronic Cough, Coughing up blood, Shortness of breath at rest and Shortness of breath with exertion. Cardiovascular Not Present- Chest Pain, Difficulty Breathing Lying Down, Murmur, Palpitations, Racing/skipping heartbeats and Swelling. Gastrointestinal Not Present- Abdominal Pain, Bloody Stool, Constipation, Diarrhea,  Difficulty Swallowing, Heartburn, Jaundice, Loss of appetitie, Nausea and Vomiting. Male Genitourinary Present- Urinating at Night. Not Present- Blood in Urine, Discharge, Flank Pain, Incontinence, Painful Urination, Urgency, Urinary frequency, Urinary Retention and Weak urinary stream. Musculoskeletal Present- Joint Pain, Joint Swelling and Morning Stiffness. Not Present- Back Pain, Muscle Pain,  Muscle Weakness and Spasms. Neurological Present- Difficulty with balance. Not Present- Blackout spells, Dizziness, Paralysis, Tremor and Weakness. Psychiatric Not Present- Insomnia.  Physical Exam General Mental Status -Alert, cooperative and good historian. General Appearance-pleasant, Not in acute distress. Orientation-Oriented X3. Build & Nutrition-Well nourished and Well developed.  Head and Neck Head-normocephalic, atraumatic . Neck Global Assessment - supple, no bruit auscultated on the right, no bruit auscultated on the left.  Eye Pupil - Bilateral-Regular and Round. Motion - Bilateral-EOMI.  Chest and Lung Exam Auscultation Breath sounds - clear at anterior chest wall and clear at posterior chest wall. Adventitious sounds - No Adventitious sounds.  Cardiovascular Auscultation Rhythm - Irregularly irregular. Murmurs & Other Heart Sounds - Auscultation of the heart reveals - No Murmurs.  Abdomen Palpation/Percussion Tenderness - Abdomen is non-tender to palpation. Rigidity (guarding) - Abdomen is soft. Auscultation Auscultation of the abdomen reveals - Bowel sounds normal.  Male Genitourinary Note: Not done, not pertinent to present illness  Musculoskeletal Note: On exam, he is in mild distress. Mood and affect is appropriate. Severely tender in the medial joint line. Left knee patellofemoral pain with compression. Crepitus is noted. Slight valgus. Mild-to-moderate effusion. He is tender on the right medial joint line with patellofemoral pain with compression.  No DVT. Range is 0 to 90.  Imaging AP, standing and repeat demonstrates bone-on-bone arthrosis, lateral compartment and patellofemoral joint minimal varus.  Assessment & Plan Primary osteoarthritis of left knee (M17.12)  Pt with end-stage Left knee DJD, bone-on-bone, refractory to conservative tx, scheduled for Left total knee replacement by Dr. Tonita Cong 11/30/14. We again discussed the procedure itself as well as risks, complications and alternatives, including but not limited to DVT, PE, infx, bleeding, failure of procedure, need for secondary procedure including manipulation, nerve injury, ongoing pain/symptoms, anesthesia risk, even stroke or death. Also discussed typical post-op protocols, activity restrictions, need for PT, flexion/extension exercises, time out of work. Discussed need for DVT ppx post-op with Xarelto then ASA per protocol. Discussed dental ppx. Also discussed limitations post-operatively such as kneeling and squatting. All questions were answered. Patient desires to proceed with surgery as scheduled. He's been cleared medically. Will hold vitamins and supplements accordingly, directed to hold coumadin x 5 days by the coumadin clinic, does not require bridging (verified by phone with Gay Filler at [facility] 8/30). Will remain NPO after MN night before surgery. Will present to [facility] for pre-op testing. Plan Percocet, Colace. Will resume Coumadin post-op and likely bridge with Lovenox to decrease risk of DVT while waiting for therapeutic INR. Anticipate 3-4 day hospital stay due to his medical hx and have asked neuro to follow in house and if they are unable, to recommend if hospitalists can help manage this. I have left a message for Dr. Guadelupe Sabin nurse to return my call regarding any other medication recommendations. Plan home with HHPT post-op with family members at home for assistance. Will follow up 10-14 days post-op for staple removal and xrays.  Plan Left total knee  replacement  Signed electronically by Cecilie Kicks, PA-C for Dr. Tonita Cong

## 2014-11-21 NOTE — Telephone Encounter (Signed)
I don't think any of our doctors do rounds at the hospital for this. Do you have any further recommendations that you want me to give [place]?

## 2014-11-21 NOTE — Telephone Encounter (Signed)
Try to continue PD meds during hospitalization, no other recs needed. They can request a neuro consult at the hospital if needed, but he would see a neuro hospitalist at the time, who is not familiar with the patient.

## 2014-11-22 NOTE — Patient Instructions (Addendum)
Brian [name]  11/22/2014   Your procedure is scheduled on:   11/30/2014    Report to [facility] Main  Entrance take [place]  elevators to 3rd floor to  [place] at      [place] AM.  Call this number if you have problems the morning of surgery [phone]   Remember: ONLY 1 PERSON MAY GO WITH YOU TO SHORT STAY TO GET  READY MORNING OF [place].  Do not eat food or drink liquids :After Midnight.     Take these medicines the morning of surgery with A SIP OF WATER:   Amlodipine ( NOrvasc), Bystolic, Carbidopa-Levodopa ( Sinemet), Lexapro, pepcid , cardura                                You may not have any metal on your body including hair pins and              piercings  Do not wear jewelry, lotions, powders or perfumes, deodorant                          Men may shave face and neck.   Do not bring valuables to the hospital. [place].  Contacts, dentures or bridgework may not be worn into surgery.  Leave suitcase in the car. After surgery it may be brought to your room.       Special Instructions: coughing and deep breathing exercises, leg exercises               Please read over the following fact sheets you were given: _____________________________________________________________________             [facility] - Preparing for Surgery Before surgery, you can play an important role.  Because skin is not sterile, your skin needs to be as free of germs as possible.  You can reduce the number of germs on your skin by washing with CHG (chlorahexidine gluconate) soap before surgery.  CHG is an antiseptic cleaner which kills germs and bonds with the skin to continue killing germs even after washing. Please DO NOT use if you have an allergy to CHG or antibacterial soaps.  If your skin becomes reddened/irritated stop using the CHG and inform your nurse when you arrive at Short Stay. Do not shave (including  legs and underarms) for at least 48 hours prior to the first CHG shower.  You may shave your face/neck. Please follow these instructions carefully:  1.  Shower with CHG Soap the night before surgery and the  morning of Surgery.  2.  If you choose to wash your hair, wash your hair first as usual with your  normal  shampoo.  3.  After you shampoo, rinse your hair and body thoroughly to remove the  shampoo.                           4.  Use CHG as you would any other liquid soap.  You can apply chg directly  to the skin and wash  Gently with a scrungie or clean washcloth.  5.  Apply the CHG Soap to your body ONLY FROM THE NECK DOWN.   Do not use on face/ open                           Wound or open sores. Avoid contact with eyes, ears mouth and genitals (private parts).                       Wash face,  Genitals (private parts) with your normal soap.             6.  Wash thoroughly, paying special attention to the area where your surgery  will be performed.  7.  Thoroughly rinse your body with warm water from the neck down.  8.  DO NOT shower/wash with your normal soap after using and rinsing off  the CHG Soap.                9.  Pat yourself dry with a clean towel.            10.  Wear clean pajamas.            11.  Place clean sheets on your bed the night of your first shower and do not  sleep with pets. Day of Surgery : Do not apply any lotions/deodorants the morning of surgery.  Please wear clean clothes to the hospital/surgery center.  FAILURE TO FOLLOW THESE INSTRUCTIONS MAY RESULT IN THE CANCELLATION OF YOUR SURGERY PATIENT SIGNATURE_________________________________  NURSE SIGNATURE__________________________________  ________________________________________________________________________  WHAT IS A BLOOD TRANSFUSION? Blood Transfusion Information  A transfusion is the replacement of blood or some of its parts. Blood is made up of multiple cells which provide  different functions.  Red blood cells carry oxygen and are used for blood loss replacement.  White blood cells fight against infection.  Platelets control bleeding.  Plasma helps clot blood.  Other blood products are available for specialized needs, such as hemophilia or other clotting disorders. BEFORE THE TRANSFUSION  Who gives blood for transfusions?   Healthy volunteers who are fully evaluated to make sure their blood is safe. This is blood bank blood. Transfusion therapy is the safest it has ever been in the practice of medicine. Before blood is taken from a donor, a complete history is taken to make sure that person has no history of diseases nor engages in risky social behavior (examples are intravenous drug use or sexual activity with multiple partners). The donor's travel history is screened to minimize risk of transmitting infections, such as malaria. The donated blood is tested for signs of infectious diseases, such as HIV and hepatitis. The blood is then tested to be sure it is compatible with you in order to minimize the chance of a transfusion reaction. If you or a relative donates blood, this is often done in anticipation of surgery and is not appropriate for emergency situations. It takes many days to process the donated blood. RISKS AND COMPLICATIONS Although transfusion therapy is very safe and saves many lives, the main dangers of transfusion include:  1. Getting an infectious disease. 2. Developing a transfusion reaction. This is an allergic reaction to something in the blood you were given. Every precaution is taken to prevent this. The decision to have a blood transfusion has been considered carefully by your caregiver before blood is given. Blood is not given unless the benefits outweigh  the risks. AFTER THE TRANSFUSION  Right after receiving a blood transfusion, you will usually feel much better and more energetic. This is especially true if your red blood cells have  gotten low (anemic). The transfusion raises the level of the red blood cells which carry oxygen, and this usually causes an energy increase.  The nurse administering the transfusion will monitor you carefully for complications. HOME CARE INSTRUCTIONS  No special instructions are needed after a transfusion. You may find your energy is better. Speak with your caregiver about any limitations on activity for underlying diseases you may have. SEEK MEDICAL CARE IF:   Your condition is not improving after your transfusion.  You develop redness or irritation at the intravenous (IV) site. SEEK IMMEDIATE MEDICAL CARE IF:  Any of the following symptoms occur over the next 12 hours:  Shaking chills.  You have a temperature by mouth above 102 F (38.9 C), not controlled by medicine.  Chest, back, or muscle pain.  People around you feel you are not acting correctly or are confused.  Shortness of breath or difficulty breathing.  Dizziness and fainting.  You get a rash or develop hives.  You have a decrease in urine output.  Your urine turns a dark color or changes to pink, red, or brown. Any of the following symptoms occur over the next 10 days:  You have a temperature by mouth above 102 F (38.9 C), not controlled by medicine.  Shortness of breath.  Weakness after normal activity.  The white part of the eye turns yellow (jaundice).  You have a decrease in the amount of urine or are urinating less often.  Your urine turns a dark color or changes to pink, red, or brown. Document Released: 03/07/2000 Document Revised: 06/02/2011 Document Reviewed: 10/25/2007 ExitCare Patient Information 2014 [place].  _______________________________________________________________________  Incentive Spirometer  An incentive spirometer is a tool that can help keep your lungs clear and active. This tool measures how well you are filling your lungs with each breath. Taking long deep breaths  may help reverse or decrease the chance of developing breathing (pulmonary) problems (especially infection) following:  A long period of time when you are unable to move or be active. BEFORE THE PROCEDURE   If the spirometer includes an indicator to show your best effort, your nurse or respiratory therapist will set it to a desired goal.  If possible, sit up straight or lean slightly forward. Try not to slouch.  Hold the incentive spirometer in an upright position. INSTRUCTIONS FOR USE  3. Sit on the edge of your bed if possible, or sit up as far as you can in bed or on a chair. 4. Hold the incentive spirometer in an upright position. 5. Breathe out normally. 6. Place the mouthpiece in your mouth and seal your lips tightly around it. 7. Breathe in slowly and as deeply as possible, raising the piston or the ball toward the top of the column. 8. Hold your breath for 3-5 seconds or for as long as possible. Allow the piston or ball to fall to the bottom of the column. 9. Remove the mouthpiece from your mouth and breathe out normally. 10. Rest for a few seconds and repeat Steps 1 through 7 at least 10 times every 1-2 hours when you are awake. Take your time and take a few normal breaths between deep breaths. 11. The spirometer may include an indicator to show your best effort. Use the indicator as a goal to work  toward during each repetition. 12. After each set of 10 deep breaths, practice coughing to be sure your lungs are clear. If you have an incision (the cut made at the time of surgery), support your incision when coughing by placing a pillow or rolled up towels firmly against it. Once you are able to get out of bed, walk around indoors and cough well. You may stop using the incentive spirometer when instructed by your caregiver.  RISKS AND COMPLICATIONS  Take your time so you do not get dizzy or light-headed.  If you are in pain, you may need to take or ask for pain medication before doing  incentive spirometry. It is harder to take a deep breath if you are having pain. AFTER USE  Rest and breathe slowly and easily.  It can be helpful to keep track of a log of your progress. Your caregiver can provide you with a simple table to help with this. If you are using the spirometer at home, follow these instructions: [place] IF:   You are having difficultly using the spirometer.  You have trouble using the spirometer as often as instructed.  Your pain medication is not giving enough relief while using the spirometer.  You develop fever of 100.5 F (38.1 C) or higher. SEEK IMMEDIATE MEDICAL CARE IF:   You cough up bloody sputum that had not been present before.  You develop fever of 102 F (38.9 C) or greater.  You develop worsening pain at or near the incision site. MAKE SURE YOU:   Understand these instructions.  Will watch your condition.  Will get help right away if you are not doing well or get worse. Document Released: 07/21/2006 Document Revised: 06/02/2011 Document Reviewed: 09/21/2006 [facility] Patient Information 2014 [place], [place].   ________________________________________________________________________

## 2014-11-22 NOTE — Telephone Encounter (Signed)
I spoke to [name] and gave recommendations below. She voices understanding and will call back with any further questions.

## 2014-11-23 ENCOUNTER — Encounter (HOSPITAL_COMMUNITY): Payer: Self-pay

## 2014-11-23 ENCOUNTER — Ambulatory Visit (HOSPITAL_COMMUNITY)
Admission: RE | Admit: 2014-11-23 | Discharge: 2014-11-23 | Disposition: A | Discharge status: Home / Self Care | Payer: Medicare Other | Source: Ambulatory Visit | Attending: Orthopedic Surgery | Admitting: Orthopedic Surgery

## 2014-11-23 ENCOUNTER — Encounter (HOSPITAL_COMMUNITY)
Admission: RE | Admit: 2014-11-23 | Discharge: 2014-11-23 | Disposition: A | Discharge status: Home / Self Care | Payer: Medicare Other | Source: Ambulatory Visit | Attending: Specialist | Admitting: Specialist

## 2014-11-23 DIAGNOSIS — M79605 Pain in left leg: Secondary | ICD-10-CM | POA: Insufficient documentation

## 2014-11-23 DIAGNOSIS — Z01818 Encounter for other preprocedural examination: Secondary | ICD-10-CM | POA: Diagnosis not present

## 2014-11-23 DIAGNOSIS — M1712 Unilateral primary osteoarthritis, left knee: Secondary | ICD-10-CM | POA: Insufficient documentation

## 2014-11-23 HISTORY — DX: Cardiac murmur, unspecified: R01.1

## 2014-11-23 LAB — URINALYSIS, ROUTINE W REFLEX MICROSCOPIC
Bilirubin Urine: NEGATIVE
Glucose, UA: NEGATIVE mg/dL
Hgb urine dipstick: NEGATIVE
KETONES UR: NEGATIVE mg/dL
LEUKOCYTES UA: NEGATIVE
NITRITE: NEGATIVE
PH: 6 (ref 5.0–8.0)
Protein, ur: NEGATIVE mg/dL
SPECIFIC GRAVITY, URINE: 1.019 (ref 1.005–1.030)
Urobilinogen, UA: 1 mg/dL (ref 0.0–1.0)

## 2014-11-23 LAB — BASIC METABOLIC PANEL
Anion gap: 10 (ref 5–15)
BUN: 12 mg/dL (ref 6–20)
CALCIUM: 9.7 mg/dL (ref 8.9–10.3)
CO2: 26 mmol/L (ref 22–32)
CREATININE: 1.19 mg/dL (ref 0.61–1.24)
Chloride: 105 mmol/L (ref 101–111)
GFR calc Af Amer: >60 mL/min (ref >60)
GFR calc non Af Amer: 59 mL/min — ABNORMAL LOW (ref >60)
GLUCOSE: 123 mg/dL — AB (ref 65–99)
Potassium: 4.3 mmol/L (ref 3.5–5.1)
Sodium: 141 mmol/L (ref 135–145)

## 2014-11-23 LAB — CBC
HCT: 40.8 % (ref 39.0–52.0)
Hemoglobin: 13.9 g/dL (ref 13.0–17.0)
MCH: 31.7 pg (ref 26.0–34.0)
MCHC: 34.1 g/dL (ref 30.0–36.0)
MCV: 93.2 fL (ref 78.0–100.0)
PLATELETS: 161 10*3/uL (ref 150–400)
RBC: 4.38 MIL/uL (ref 4.22–5.81)
RDW: 13.6 % (ref 11.5–15.5)
WBC: 4 10*3/uL (ref 4.0–10.5)

## 2014-11-23 LAB — SURGICAL PCR SCREEN
MRSA, PCR: NEGATIVE
STAPHYLOCOCCUS AUREUS: NEGATIVE

## 2014-11-23 LAB — PROTIME-INR
INR: 2.52 — ABNORMAL HIGH (ref 0.00–1.49)
PROTHROMBIN TIME: 26.9 s — AB (ref 11.6–15.2)

## 2014-11-23 LAB — APTT: APTT: 38 s — AB (ref 24–37)

## 2014-11-23 LAB — ABO/RH: ABO/RH(D): O POS

## 2014-11-23 NOTE — Progress Notes (Addendum)
PT/INR and PTT  result done 11/23/14 faxed via EPIC to Dr Tonita Cong.

## 2014-11-23 NOTE — Progress Notes (Signed)
EKG- 03/03/14- EPIC  ECHO-2014-EPIC  09/01/14 LOV with DR Radford Pax in [facility]

## 2014-11-29 LAB — TYPE AND SCREEN
ABO/RH(D): O POS
Antibody Screen: NEGATIVE

## 2014-11-29 MED ORDER — VANCOMYCIN HCL 10 G IV SOLR
1500.0000 mg | INTRAVENOUS | Status: AC
  Administered 2014-11-30 (×2): 1500 mg via INTRAVENOUS
  Filled 2014-11-29 (×2): qty 1500

## 2014-11-30 ENCOUNTER — Inpatient Hospital Stay (HOSPITAL_COMMUNITY): Payer: Medicare Other

## 2014-11-30 ENCOUNTER — Encounter (HOSPITAL_COMMUNITY)
Admission: RE | Disposition: A | Discharge status: Skilled Nursing Facility | Payer: Self-pay | Source: Ambulatory Visit | Attending: Specialist

## 2014-11-30 ENCOUNTER — Inpatient Hospital Stay (HOSPITAL_COMMUNITY): Payer: Medicare Other | Admitting: Certified Registered Nurse Anesthetist

## 2014-11-30 ENCOUNTER — Encounter (HOSPITAL_COMMUNITY): Payer: Self-pay | Admitting: *Deleted

## 2014-11-30 ENCOUNTER — Inpatient Hospital Stay (HOSPITAL_COMMUNITY)
Admission: RE | Admit: 2014-11-30 | Discharge: 2014-12-03 | DRG: 470 | Disposition: A | Discharge status: Skilled Nursing Facility | Payer: Medicare Other | Source: Ambulatory Visit | Attending: Specialist | Admitting: Specialist

## 2014-11-30 DIAGNOSIS — I4891 Unspecified atrial fibrillation: Secondary | ICD-10-CM | POA: Diagnosis not present

## 2014-11-30 DIAGNOSIS — K219 Gastro-esophageal reflux disease without esophagitis: Secondary | ICD-10-CM | POA: Diagnosis present

## 2014-11-30 DIAGNOSIS — M1712 Unilateral primary osteoarthritis, left knee: Secondary | ICD-10-CM | POA: Diagnosis present

## 2014-11-30 DIAGNOSIS — G8918 Other acute postprocedural pain: Secondary | ICD-10-CM | POA: Diagnosis not present

## 2014-11-30 DIAGNOSIS — Z8614 Personal history of Methicillin resistant Staphylococcus aureus infection: Secondary | ICD-10-CM

## 2014-11-30 DIAGNOSIS — Z800 Family history of malignant neoplasm of digestive organs: Secondary | ICD-10-CM

## 2014-11-30 DIAGNOSIS — E780 Pure hypercholesterolemia: Secondary | ICD-10-CM | POA: Diagnosis present

## 2014-11-30 DIAGNOSIS — J984 Other disorders of lung: Secondary | ICD-10-CM | POA: Diagnosis present

## 2014-11-30 DIAGNOSIS — I480 Paroxysmal atrial fibrillation: Secondary | ICD-10-CM | POA: Diagnosis not present

## 2014-11-30 DIAGNOSIS — M6281 Muscle weakness (generalized): Secondary | ICD-10-CM | POA: Diagnosis not present

## 2014-11-30 DIAGNOSIS — I340 Nonrheumatic mitral (valve) insufficiency: Secondary | ICD-10-CM | POA: Diagnosis present

## 2014-11-30 DIAGNOSIS — Z79899 Other long term (current) drug therapy: Secondary | ICD-10-CM | POA: Diagnosis not present

## 2014-11-30 DIAGNOSIS — N182 Chronic kidney disease, stage 2 (mild): Secondary | ICD-10-CM | POA: Diagnosis not present

## 2014-11-30 DIAGNOSIS — Z7901 Long term (current) use of anticoagulants: Secondary | ICD-10-CM | POA: Diagnosis not present

## 2014-11-30 DIAGNOSIS — M25461 Effusion, right knee: Secondary | ICD-10-CM | POA: Diagnosis present

## 2014-11-30 DIAGNOSIS — Z833 Family history of diabetes mellitus: Secondary | ICD-10-CM | POA: Diagnosis not present

## 2014-11-30 DIAGNOSIS — Z823 Family history of stroke: Secondary | ICD-10-CM

## 2014-11-30 DIAGNOSIS — R600 Localized edema: Secondary | ICD-10-CM | POA: Diagnosis present

## 2014-11-30 DIAGNOSIS — M7989 Other specified soft tissue disorders: Secondary | ICD-10-CM | POA: Diagnosis not present

## 2014-11-30 DIAGNOSIS — I481 Persistent atrial fibrillation: Secondary | ICD-10-CM | POA: Diagnosis present

## 2014-11-30 DIAGNOSIS — M25562 Pain in left knee: Secondary | ICD-10-CM | POA: Diagnosis not present

## 2014-11-30 DIAGNOSIS — G20 Parkinson's disease: Secondary | ICD-10-CM | POA: Diagnosis present

## 2014-11-30 DIAGNOSIS — I129 Hypertensive chronic kidney disease with stage 1 through stage 4 chronic kidney disease, or unspecified chronic kidney disease: Secondary | ICD-10-CM | POA: Diagnosis present

## 2014-11-30 DIAGNOSIS — E785 Hyperlipidemia, unspecified: Secondary | ICD-10-CM | POA: Diagnosis present

## 2014-11-30 DIAGNOSIS — R3915 Urgency of urination: Secondary | ICD-10-CM | POA: Diagnosis present

## 2014-11-30 DIAGNOSIS — Z96652 Presence of left artificial knee joint: Secondary | ICD-10-CM | POA: Diagnosis not present

## 2014-11-30 DIAGNOSIS — M179 Osteoarthritis of knee, unspecified: Secondary | ICD-10-CM | POA: Diagnosis not present

## 2014-11-30 DIAGNOSIS — Z8249 Family history of ischemic heart disease and other diseases of the circulatory system: Secondary | ICD-10-CM | POA: Diagnosis not present

## 2014-11-30 DIAGNOSIS — Z471 Aftercare following joint replacement surgery: Secondary | ICD-10-CM | POA: Diagnosis not present

## 2014-11-30 DIAGNOSIS — R2689 Other abnormalities of gait and mobility: Secondary | ICD-10-CM | POA: Diagnosis not present

## 2014-11-30 DIAGNOSIS — N400 Enlarged prostate without lower urinary tract symptoms: Secondary | ICD-10-CM | POA: Diagnosis present

## 2014-11-30 DIAGNOSIS — I10 Essential (primary) hypertension: Secondary | ICD-10-CM | POA: Diagnosis not present

## 2014-11-30 DIAGNOSIS — R278 Other lack of coordination: Secondary | ICD-10-CM | POA: Diagnosis not present

## 2014-11-30 DIAGNOSIS — G20C Parkinsonism, unspecified: Secondary | ICD-10-CM | POA: Diagnosis present

## 2014-11-30 DIAGNOSIS — Z96659 Presence of unspecified artificial knee joint: Secondary | ICD-10-CM

## 2014-11-30 HISTORY — PX: TOTAL KNEE ARTHROPLASTY: SHX125

## 2014-11-30 LAB — PROTIME-INR
INR: 1.3 (ref 0.00–1.49)
Prothrombin Time: 16.3 seconds — ABNORMAL HIGH (ref 11.6–15.2)

## 2014-11-30 SURGERY — ARTHROPLASTY, KNEE, TOTAL
Anesthesia: General | Site: Knee | Laterality: Left

## 2014-11-30 MED ORDER — DOXAZOSIN MESYLATE 2 MG PO TABS
2.0000 mg | ORAL_TABLET | Freq: Three times a day (TID) | ORAL | Status: DC
  Administered 2014-12-01 – 2014-12-03 (×5): 2 mg via ORAL
  Filled 2014-11-30 (×11): qty 1

## 2014-11-30 MED ORDER — FENTANYL CITRATE (PF) 100 MCG/2ML IJ SOLN
INTRAMUSCULAR | Status: AC
  Filled 2014-11-30: qty 4

## 2014-11-30 MED ORDER — BUPIVACAINE-EPINEPHRINE (PF) 0.5% -1:200000 IJ SOLN
INTRAMUSCULAR | Status: AC
  Filled 2014-11-30: qty 30

## 2014-11-30 MED ORDER — IRBESARTAN 300 MG PO TABS
300.0000 mg | ORAL_TABLET | Freq: Every day | ORAL | Status: DC
  Administered 2014-11-30 – 2014-12-01 (×2): 300 mg via ORAL
  Filled 2014-11-30 (×3): qty 1

## 2014-11-30 MED ORDER — METOPROLOL TARTRATE 1 MG/ML IV SOLN
5.0000 mg | Freq: Once | INTRAVENOUS | Status: AC
  Administered 2014-11-30: 5 mg via INTRAVENOUS

## 2014-11-30 MED ORDER — AMLODIPINE BESYLATE 10 MG PO TABS: 10.0000 mg | ORAL_TABLET | Freq: Every day | ORAL | Status: DC

## 2014-11-30 MED ORDER — SODIUM CHLORIDE 0.9 % IR SOLN
Status: DC | PRN
  Administered 2014-11-30 (×2): 1000 mL

## 2014-11-30 MED ORDER — IRBESARTAN 300 MG PO TABS
300.0000 mg | ORAL_TABLET | Freq: Every day | ORAL | Status: DC
  Filled 2014-11-30: qty 1

## 2014-11-30 MED ORDER — LACTATED RINGERS IV SOLN: INTRAVENOUS | Status: DC

## 2014-11-30 MED ORDER — MENTHOL 3 MG MT LOZG
1.0000 | LOZENGE | OROMUCOSAL | Status: DC | PRN
  Filled 2014-11-30: qty 9

## 2014-11-30 MED ORDER — OXYBUTYNIN CHLORIDE 5 MG PO TABS
5.0000 mg | ORAL_TABLET | Freq: Every day | ORAL | Status: DC
  Administered 2014-12-01 – 2014-12-03 (×3): 5 mg via ORAL
  Filled 2014-11-30 (×4): qty 1

## 2014-11-30 MED ORDER — ONDANSETRON HCL 4 MG PO TABS: 4.0000 mg | ORAL_TABLET | Freq: Four times a day (QID) | ORAL | Status: DC | PRN

## 2014-11-30 MED ORDER — ONDANSETRON HCL 4 MG/2ML IJ SOLN
INTRAMUSCULAR | Status: AC
  Filled 2014-11-30: qty 2

## 2014-11-30 MED ORDER — OXYCODONE HCL 5 MG PO TABS
5.0000 mg | ORAL_TABLET | ORAL | Status: DC | PRN
  Administered 2014-12-01 (×3): 5 mg via ORAL
  Filled 2014-11-30 (×3): qty 1

## 2014-11-30 MED ORDER — BUPIVACAINE-EPINEPHRINE 0.25% -1:200000 IJ SOLN
INTRAMUSCULAR | Status: AC
  Filled 2014-11-30: qty 1

## 2014-11-30 MED ORDER — ESCITALOPRAM OXALATE 5 MG PO TABS: 5.0000 mg | ORAL_TABLET | Freq: Every day | ORAL | Status: DC

## 2014-11-30 MED ORDER — HYDROMORPHONE HCL 1 MG/ML IJ SOLN
INTRAMUSCULAR | Status: AC
  Filled 2014-11-30: qty 1

## 2014-11-30 MED ORDER — FENTANYL CITRATE (PF) 100 MCG/2ML IJ SOLN
INTRAMUSCULAR | Status: DC | PRN
  Administered 2014-11-30 (×6): 50 ug via INTRAVENOUS

## 2014-11-30 MED ORDER — PROPOFOL 10 MG/ML IV BOLUS
INTRAVENOUS | Status: AC
Start: 2014-11-30 — End: 2014-11-30
  Filled 2014-11-30: qty 20

## 2014-11-30 MED ORDER — RISAQUAD PO CAPS
1.0000 | ORAL_CAPSULE | Freq: Every day | ORAL | Status: DC
  Administered 2014-11-30 – 2014-12-03 (×4): 1 via ORAL
  Filled 2014-11-30 (×4): qty 1

## 2014-11-30 MED ORDER — BUPIVACAINE-EPINEPHRINE 0.25% -1:200000 IJ SOLN
INTRAMUSCULAR | Status: DC | PRN
  Administered 2014-11-30: 50 mL

## 2014-11-30 MED ORDER — AMLODIPINE BESYLATE 10 MG PO TABS
10.0000 mg | ORAL_TABLET | Freq: Every day | ORAL | Status: DC
  Administered 2014-11-30: 10 mg via ORAL
  Filled 2014-11-30 (×3): qty 1

## 2014-11-30 MED ORDER — FENTANYL CITRATE (PF) 100 MCG/2ML IJ SOLN
INTRAMUSCULAR | Status: AC
Start: 2014-11-30 — End: 2014-11-30
  Filled 2014-11-30: qty 4

## 2014-11-30 MED ORDER — METOCLOPRAMIDE HCL 5 MG/ML IJ SOLN: 5.0000 mg | Freq: Three times a day (TID) | INTRAMUSCULAR | Status: DC | PRN

## 2014-11-30 MED ORDER — KCL IN DEXTROSE-NACL 10-5-0.45 MEQ/L-%-% IV SOLN
INTRAVENOUS | Status: DC
  Administered 2014-11-30: 16:00:00 via INTRAVENOUS
  Filled 2014-11-30 (×2): qty 1000

## 2014-11-30 MED ORDER — CARBIDOPA-LEVODOPA 25-100 MG PO TABS
1.5000 | ORAL_TABLET | ORAL | Status: DC
  Administered 2014-11-30 – 2014-12-03 (×11): 1.5 via ORAL
  Filled 2014-11-30 (×23): qty 1.5

## 2014-11-30 MED ORDER — WARFARIN SODIUM 5 MG PO TABS
5.0000 mg | ORAL_TABLET | Freq: Once | ORAL | Status: AC
  Administered 2014-11-30: 5 mg via ORAL
  Filled 2014-11-30: qty 1

## 2014-11-30 MED ORDER — HYDROMORPHONE HCL 1 MG/ML IJ SOLN
0.2500 mg | INTRAMUSCULAR | Status: DC | PRN
  Administered 2014-11-30: 0.25 mg via INTRAVENOUS

## 2014-11-30 MED ORDER — LIDOCAINE HCL (CARDIAC) 20 MG/ML IV SOLN
INTRAVENOUS | Status: DC | PRN
  Administered 2014-11-30: 60 mg via INTRAVENOUS

## 2014-11-30 MED ORDER — HYDRALAZINE HCL 20 MG/ML IJ SOLN
INTRAMUSCULAR | Status: DC | PRN
  Administered 2014-11-30: 2.5 mg via INTRAVENOUS

## 2014-11-30 MED ORDER — METOCLOPRAMIDE HCL 5 MG/ML IJ SOLN
INTRAMUSCULAR | Status: DC | PRN
  Administered 2014-11-30: 5 mg via INTRAVENOUS

## 2014-11-30 MED ORDER — METHOCARBAMOL 1000 MG/10ML IJ SOLN
500.0000 mg | Freq: Four times a day (QID) | INTRAVENOUS | Status: DC | PRN
  Administered 2014-11-30: 500 mg via INTRAVENOUS
  Filled 2014-11-30 (×2): qty 5

## 2014-11-30 MED ORDER — HYDROMORPHONE HCL 2 MG/ML IJ SOLN
INTRAMUSCULAR | Status: AC
  Filled 2014-11-30: qty 1

## 2014-11-30 MED ORDER — MEPERIDINE HCL 50 MG/ML IJ SOLN: 6.2500 mg | INTRAMUSCULAR | Status: DC | PRN

## 2014-11-30 MED ORDER — SODIUM CHLORIDE 0.9 % IR SOLN
Status: DC | PRN
  Administered 2014-11-30: 500 mL

## 2014-11-30 MED ORDER — NEBIVOLOL HCL 10 MG PO TABS
10.0000 mg | ORAL_TABLET | Freq: Every day | ORAL | Status: DC
  Administered 2014-12-01 – 2014-12-03 (×3): 10 mg via ORAL
  Filled 2014-11-30 (×3): qty 1

## 2014-11-30 MED ORDER — GLYCOPYRROLATE 0.2 MG/ML IJ SOLN
INTRAMUSCULAR | Status: AC
  Filled 2014-11-30: qty 3

## 2014-11-30 MED ORDER — MIDAZOLAM HCL 5 MG/5ML IJ SOLN
INTRAMUSCULAR | Status: DC | PRN
  Administered 2014-11-30: 0.5 mg via INTRAVENOUS
  Administered 2014-11-30: 1 mg via INTRAVENOUS
  Administered 2014-11-30: 0.5 mg via INTRAVENOUS

## 2014-11-30 MED ORDER — SODIUM CHLORIDE 0.9 % IR SOLN
Status: AC
  Filled 2014-11-30: qty 1

## 2014-11-30 MED ORDER — DIPHENHYDRAMINE HCL 12.5 MG/5ML PO ELIX: 12.5000 mg | ORAL_SOLUTION | ORAL | Status: DC | PRN

## 2014-11-30 MED ORDER — ONDANSETRON HCL 4 MG/2ML IJ SOLN: 4.0000 mg | Freq: Four times a day (QID) | INTRAMUSCULAR | Status: DC | PRN

## 2014-11-30 MED ORDER — PHENOL 1.4 % MT LIQD: 1.0000 | OROMUCOSAL | Status: DC | PRN

## 2014-11-30 MED ORDER — ENOXAPARIN SODIUM 30 MG/0.3ML ~~LOC~~ SOLN
30.0000 mg | SUBCUTANEOUS | Status: DC
  Administered 2014-12-01 – 2014-12-03 (×3): 30 mg via SUBCUTANEOUS
  Filled 2014-11-30 (×4): qty 0.3

## 2014-11-30 MED ORDER — HYDROMORPHONE HCL 1 MG/ML IJ SOLN
INTRAMUSCULAR | Status: DC | PRN
  Administered 2014-11-30: 1 mg via INTRAVENOUS
  Administered 2014-11-30 (×2): 0.5 mg via INTRAVENOUS

## 2014-11-30 MED ORDER — ACETAMINOPHEN 650 MG RE SUPP: 650.0000 mg | Freq: Four times a day (QID) | RECTAL | Status: DC | PRN

## 2014-11-30 MED ORDER — LACTATED RINGERS IV SOLN
INTRAVENOUS | Status: DC | PRN
  Administered 2014-11-30 (×3): via INTRAVENOUS

## 2014-11-30 MED ORDER — POTASSIUM CHLORIDE CRYS ER 10 MEQ PO TBCR
10.0000 meq | EXTENDED_RELEASE_TABLET | Freq: Every day | ORAL | Status: DC
  Administered 2014-11-30: 10 meq via ORAL
  Filled 2014-11-30 (×2): qty 1

## 2014-11-30 MED ORDER — FUROSEMIDE 20 MG PO TABS
20.0000 mg | ORAL_TABLET | Freq: Two times a day (BID) | ORAL | Status: DC
  Administered 2014-11-30: 20 mg via ORAL
  Filled 2014-11-30 (×4): qty 1

## 2014-11-30 MED ORDER — METOPROLOL TARTRATE 1 MG/ML IV SOLN: 5.0000 mg | INTRAVENOUS | Status: DC | PRN

## 2014-11-30 MED ORDER — VANCOMYCIN HCL 10 G IV SOLR
1500.0000 mg | Freq: Two times a day (BID) | INTRAVENOUS | Status: AC
  Administered 2014-11-30: 1500 mg via INTRAVENOUS
  Filled 2014-11-30: qty 1500

## 2014-11-30 MED ORDER — WARFARIN - PHARMACIST DOSING INPATIENT: Freq: Every day | Status: DC

## 2014-11-30 MED ORDER — PROPOFOL 10 MG/ML IV BOLUS
INTRAVENOUS | Status: AC
  Filled 2014-11-30: qty 20

## 2014-11-30 MED ORDER — FAMOTIDINE 40 MG PO TABS
40.0000 mg | ORAL_TABLET | Freq: Every day | ORAL | Status: DC
  Administered 2014-12-01 – 2014-12-03 (×3): 40 mg via ORAL
  Filled 2014-11-30 (×3): qty 1

## 2014-11-30 MED ORDER — ACETAMINOPHEN 10 MG/ML IV SOLN
INTRAVENOUS | Status: DC | PRN
  Administered 2014-11-30: 1000 mg via INTRAVENOUS

## 2014-11-30 MED ORDER — ESCITALOPRAM OXALATE 5 MG PO TABS
5.0000 mg | ORAL_TABLET | Freq: Every day | ORAL | Status: DC
  Administered 2014-12-01 – 2014-12-02 (×2): 5 mg via ORAL
  Filled 2014-11-30 (×3): qty 1

## 2014-11-30 MED ORDER — NEBIVOLOL HCL 10 MG PO TABS
10.0000 mg | ORAL_TABLET | Freq: Once | ORAL | Status: AC
  Administered 2014-11-30: 10 mg via ORAL
  Filled 2014-11-30: qty 1

## 2014-11-30 MED ORDER — ACETAMINOPHEN 10 MG/ML IV SOLN
INTRAVENOUS | Status: AC
  Filled 2014-11-30: qty 100

## 2014-11-30 MED ORDER — LIDOCAINE HCL (CARDIAC) 20 MG/ML IV SOLN
INTRAVENOUS | Status: AC
  Filled 2014-11-30: qty 5

## 2014-11-30 MED ORDER — ACETAMINOPHEN 325 MG PO TABS: 650.0000 mg | ORAL_TABLET | Freq: Four times a day (QID) | ORAL | Status: DC | PRN

## 2014-11-30 MED ORDER — GLYCOPYRROLATE 0.2 MG/ML IJ SOLN
INTRAMUSCULAR | Status: DC | PRN
Start: 2014-11-30 — End: 2014-11-30
  Administered 2014-11-30: .3 mg via INTRAVENOUS

## 2014-11-30 MED ORDER — ROCURONIUM BROMIDE 100 MG/10ML IV SOLN
INTRAVENOUS | Status: DC | PRN
  Administered 2014-11-30: 50 mg via INTRAVENOUS

## 2014-11-30 MED ORDER — METHOCARBAMOL 500 MG PO TABS: 500.0000 mg | ORAL_TABLET | Freq: Four times a day (QID) | ORAL | Status: DC | PRN

## 2014-11-30 MED ORDER — MIDAZOLAM HCL 2 MG/2ML IJ SOLN
INTRAMUSCULAR | Status: AC
  Filled 2014-11-30: qty 4

## 2014-11-30 MED ORDER — CARBIDOPA-LEVODOPA ER 50-200 MG PO TBCR
1.0000 | EXTENDED_RELEASE_TABLET | Freq: Every day | ORAL | Status: DC
Start: 2014-11-30 — End: 2014-12-03
  Administered 2014-11-30 – 2014-12-02 (×3): 1 via ORAL
  Filled 2014-11-30 (×4): qty 1

## 2014-11-30 MED ORDER — PROPOFOL 10 MG/ML IV BOLUS
INTRAVENOUS | Status: DC | PRN
  Administered 2014-11-30: 180 mg via INTRAVENOUS
  Administered 2014-11-30: 20 mg via INTRAVENOUS

## 2014-11-30 MED ORDER — METOCLOPRAMIDE HCL 10 MG PO TABS: 5.0000 mg | ORAL_TABLET | Freq: Three times a day (TID) | ORAL | Status: DC | PRN

## 2014-11-30 MED ORDER — DOXAZOSIN MESYLATE 2 MG PO TABS: 2.0000 mg | ORAL_TABLET | Freq: Every day | ORAL | Status: DC

## 2014-11-30 MED ORDER — GABAPENTIN 100 MG PO CAPS
100.0000 mg | ORAL_CAPSULE | Freq: Three times a day (TID) | ORAL | Status: DC
  Administered 2014-11-30 – 2014-12-03 (×9): 100 mg via ORAL
  Filled 2014-11-30 (×11): qty 1

## 2014-11-30 MED ORDER — NEOSTIGMINE METHYLSULFATE 10 MG/10ML IV SOLN
INTRAVENOUS | Status: AC
  Filled 2014-11-30: qty 1

## 2014-11-30 MED ORDER — ONDANSETRON HCL 4 MG/2ML IJ SOLN
INTRAMUSCULAR | Status: DC | PRN
  Administered 2014-11-30: 4 mg via INTRAVENOUS

## 2014-11-30 MED ORDER — ONDANSETRON HCL 4 MG/2ML IJ SOLN: 4.0000 mg | Freq: Once | INTRAMUSCULAR | Status: DC | PRN

## 2014-11-30 MED ORDER — NEOSTIGMINE METHYLSULFATE 10 MG/10ML IV SOLN
INTRAVENOUS | Status: DC | PRN
  Administered 2014-11-30: 2 mg via INTRAVENOUS

## 2014-11-30 MED ORDER — METOPROLOL TARTRATE 1 MG/ML IV SOLN
INTRAVENOUS | Status: AC
  Filled 2014-11-30: qty 5

## 2014-11-30 MED ORDER — HYDROMORPHONE HCL 1 MG/ML IJ SOLN: 0.5000 mg | INTRAMUSCULAR | Status: DC | PRN

## 2014-11-30 MED ORDER — HYDRALAZINE HCL 20 MG/ML IJ SOLN
INTRAMUSCULAR | Status: AC
  Filled 2014-11-30: qty 1

## 2014-11-30 MED ORDER — CLONAZEPAM 0.125 MG PO TBDP
0.5000 mg | ORAL_TABLET | Freq: Every day | ORAL | Status: DC
  Administered 2014-11-30 – 2014-12-02 (×3): 0.5 mg via ORAL
  Filled 2014-11-30 (×3): qty 4

## 2014-11-30 MED ORDER — ALUM & MAG HYDROXIDE-SIMETH 200-200-20 MG/5ML PO SUSP: 30.0000 mL | ORAL | Status: DC | PRN

## 2014-11-30 MED ORDER — DEXAMETHASONE SODIUM PHOSPHATE 10 MG/ML IJ SOLN
INTRAMUSCULAR | Status: DC | PRN
  Administered 2014-11-30: 10 mg via INTRAVENOUS

## 2014-11-30 SURGICAL SUPPLY — 73 items
BAG ZIPLOCK 12X15 (MISCELLANEOUS) IMPLANT
BANDAGE ELASTIC 4 VELCRO ST LF (GAUZE/BANDAGES/DRESSINGS) ×3 IMPLANT
BANDAGE ELASTIC 6 VELCRO ST LF (GAUZE/BANDAGES/DRESSINGS) ×3 IMPLANT
BANDAGE ESMARK 6X9 LF (GAUZE/BANDAGES/DRESSINGS) ×1 IMPLANT
BLADE SAG 18X100X1.27 (BLADE) ×3 IMPLANT
BLADE SAW SGTL 13.0X1.19X90.0M (BLADE) ×3 IMPLANT
BNDG ESMARK 6X9 LF (GAUZE/BANDAGES/DRESSINGS) ×3
CAP KNEE TOTAL 3 SIGMA ×3 IMPLANT
CEMENT HV SMART SET (Cement) ×6 IMPLANT
CHLORAPREP W/TINT 26ML (MISCELLANEOUS) IMPLANT
CLOSURE WOUND 1/2 X4 (GAUZE/BANDAGES/DRESSINGS)
CLOTH 2% CHLOROHEXIDINE 3PK (PERSONAL CARE ITEMS) ×3 IMPLANT
CUFF TOURN SGL QUICK 34 (TOURNIQUET CUFF) ×2
CUFF TRNQT CYL 34X4X40X1 (TOURNIQUET CUFF) ×1 IMPLANT
DECANTER SPIKE VIAL GLASS SM (MISCELLANEOUS) ×3 IMPLANT
DRAPE INCISE IOBAN 66X45 STRL (DRAPES) IMPLANT
DRAPE ORTHO SPLIT 77X108 STRL (DRAPES) ×4
DRAPE POUCH INSTRU U-SHP 10X18 (DRAPES) ×3 IMPLANT
DRAPE SHEET LG 3/4 BI-LAMINATE (DRAPES) ×9 IMPLANT
DRAPE SURG ORHT 6 SPLT 77X108 (DRAPES) ×2 IMPLANT
DRAPE U-SHAPE 47X51 STRL (DRAPES) ×3 IMPLANT
DRSG ADAPTIC 3X8 NADH LF (GAUZE/BANDAGES/DRESSINGS) IMPLANT
DRSG AQUACEL AG ADV 3.5X10 (GAUZE/BANDAGES/DRESSINGS) ×3 IMPLANT
DRSG PAD ABDOMINAL 8X10 ST (GAUZE/BANDAGES/DRESSINGS) IMPLANT
DRSG TEGADERM 4X4.75 (GAUZE/BANDAGES/DRESSINGS) IMPLANT
ELECT REM PT RETURN 9FT ADLT (ELECTROSURGICAL) ×3
ELECTRODE REM PT RTRN 9FT ADLT (ELECTROSURGICAL) ×1 IMPLANT
EVACUATOR 1/8 PVC DRAIN (DRAIN) IMPLANT
FACESHIELD WRAPAROUND (MASK) ×15 IMPLANT
GAUZE SPONGE 2X2 8PLY STRL LF (GAUZE/BANDAGES/DRESSINGS) IMPLANT
GAUZE SPONGE 4X4 12PLY STRL (GAUZE/BANDAGES/DRESSINGS) IMPLANT
GLOVE BIOGEL PI IND STRL 7.5 (GLOVE) ×1 IMPLANT
GLOVE BIOGEL PI IND STRL 8 (GLOVE) ×1 IMPLANT
GLOVE BIOGEL PI INDICATOR 7.5 (GLOVE) ×2
GLOVE BIOGEL PI INDICATOR 8 (GLOVE) ×2
GLOVE SURG SS PI 7.5 STRL IVOR (GLOVE) ×3 IMPLANT
GLOVE SURG SS PI 8.0 STRL IVOR (GLOVE) ×6 IMPLANT
GOWN STRL REUS W/TWL XL LVL3 (GOWN DISPOSABLE) ×6 IMPLANT
HANDPIECE INTERPULSE COAX TIP (DISPOSABLE) ×3
IMMOBILIZER KNEE 20 (SOFTGOODS) ×3
IMMOBILIZER KNEE 20 THIGH 36 (SOFTGOODS) ×1 IMPLANT
IMMOBILIZER KNEE 22 (SOFTGOODS) ×3 IMPLANT
KIT BASIN OR (CUSTOM PROCEDURE TRAY) ×3 IMPLANT
MANIFOLD NEPTUNE II (INSTRUMENTS) ×3 IMPLANT
NDL SAFETY ECLIPSE 18X1.5 (NEEDLE) ×1 IMPLANT
NEEDLE HYPO 18GX1.5 SHARP (NEEDLE) ×2
NS IRRIG 1000ML POUR BTL (IV SOLUTION) IMPLANT
PACK TOTAL JOINT (CUSTOM PROCEDURE TRAY) ×3 IMPLANT
PADDING CAST COTTON 6X4 STRL (CAST SUPPLIES) IMPLANT
PEN SKIN MARKING BROAD (MISCELLANEOUS) ×3 IMPLANT
POSITIONER SURGICAL ARM (MISCELLANEOUS) ×3 IMPLANT
SET HNDPC FAN SPRY TIP SCT (DISPOSABLE) ×1 IMPLANT
SPONGE GAUZE 2X2 STER 10/PKG (GAUZE/BANDAGES/DRESSINGS)
SPONGE SURGIFOAM ABS GEL 100 (HEMOSTASIS) IMPLANT
STAPLER VISISTAT (STAPLE) ×3 IMPLANT
STRIP CLOSURE SKIN 1/2X4 (GAUZE/BANDAGES/DRESSINGS) IMPLANT
SUCTION FRAZIER 12FR DISP (SUCTIONS) ×3 IMPLANT
SUT BONE WAX W31G (SUTURE) IMPLANT
SUT MNCRL AB 4-0 PS2 18 (SUTURE) IMPLANT
SUT VIC AB 1 CT1 27 (SUTURE) ×2
SUT VIC AB 1 CT1 27XBRD ANTBC (SUTURE) ×1 IMPLANT
SUT VIC AB 2-0 CT1 27 (SUTURE) ×6
SUT VIC AB 2-0 CT1 TAPERPNT 27 (SUTURE) ×3 IMPLANT
SUT VLOC 180 0 24IN GS25 (SUTURE) ×3 IMPLANT
SYR 50ML LL SCALE MARK (SYRINGE) ×3 IMPLANT
TOWEL OR 17X26 10 PK STRL BLUE (TOWEL DISPOSABLE) ×3 IMPLANT
TOWEL OR NON WOVEN STRL DISP B (DISPOSABLE) IMPLANT
TOWER CARTRIDGE SMART MIX (DISPOSABLE) ×3 IMPLANT
TRAY FOLEY W/METER SILVER 16FR (SET/KITS/TRAYS/PACK) ×3 IMPLANT
WAND HAND CNTRL MULTIVAC 50 (MISCELLANEOUS) IMPLANT
WATER STERILE IRR 1500ML POUR (IV SOLUTION) ×3 IMPLANT
WRAP KNEE MAXI GEL POST OP (GAUZE/BANDAGES/DRESSINGS) ×3 IMPLANT
YANKAUER SUCT BULB TIP 10FT TU (MISCELLANEOUS) ×3 IMPLANT

## 2014-11-30 NOTE — Progress Notes (Signed)
PACU note-----pt's heart rate 90-100 atrial fib; Dr. Conrad Continental in and made aware; will continue to observe

## 2014-11-30 NOTE — Plan of Care (Signed)
Problem: Consults Goal: Diagnosis- Total Joint Replacement Primary Total Knee, LEFT

## 2014-11-30 NOTE — H&P (View-Only) (Signed)
Brian [name] DOB: Jun 09, 1940 Married / Language: English / Race: Black or African American Male  H&P Date: 11/21/14  Chief complaint: Left knee pain  History of Present Illness The patient is a 74 year old male who comes in today for a preoperative History and Physical. The patient is scheduled for a left total knee arthroplasty to be performed by Dr. Johnn Hai, MD at [facility] on 11/30/2014. Brian [name] reports disabling left knee pain refractory to steroid injections, viscosupplementation, bracing, activity modifications, home exercise program. He reports pain interfering with ADL's and he desires to proceed with surgical intervention at this point. He has been cleared by cardiology and told to hold his coumadin x 5 days pre-op (on for A.Fib). He will not require bridging with any other anticoagulation pre-op per [place] [facility]madin [facility]. He has Parkinson's, has been cleared by his neurologist with recommendation to continue current medications.  Dr. Tonita Cong and the patient mutually agreed to proceed with a total knee replacement. Risks and benefits of the procedure were discussed including stiffness, suboptimal range of motion, persistent pain, infection requiring removal of prosthesis and reinsertion, need for prophylactic antibiotics in the future, for example, dental procedures, possible need for manipulation, revision in the future and also anesthetic complications including DVT, PE, etc. We discussed the perioperative course, time in the hospital, postoperative recovery and the need for elevation to control swelling. We also discussed the predicted range of motion and the probability that squatting and kneeling would be unobtainable in the future. In addition, postoperative anticoagulation was discussed. We have obtained preoperative medical clearance as necessary. Provided illustrated handout and discussed it in detail. They will enroll in the total joint replacement educational forum  at the hospital.  Allergies No Known Drug Allergies01/10/2011  Family History Cerebrovascular Accident father First Degree Relatives Cancer brother Hypertension father Diabetes Mellitus mother  Social History Tobacco use Never smoker.  Medication History Carbidopa-Levodopa (Oral) Specific dose unknown - Active. (CR) Carbidopa (Oral) Specific dose unknown - Active. (4x per day) Escitalopram Oxalate (Oral) Specific dose unknown - Active. ClonazePAM (Oral) Specific dose unknown - Active. Furosemide (Oral) Specific dose unknown - Active. Doxazosin Mesylate (2MG  Tablet, Oral three times daily) Active. Bystolic (10MG  Tablet, Oral daily) Active. Pravastatin Sodium (40MG  Tablet, Oral daily) Active. Warfarin Sodium (5MG  Tablet, Oral daily) Active. Gabapentin (100MG  Capsule, Oral three times daily) Active. Azor (10-40MG  Tablet, Oral) Active. Centrum Silver (Oral) Active. Vitamin D3 (Oral) Specific dose unknown - Active. Medications Reconciled  Past Surgical History No pertinent past surgical history  Past Medical Hx High blood pressure Cardiac Arrhythmia  Hypercholesterolemia Primary localized osteoarthritis of left knee (M17.12) Hip pain, left (M25.552) Cataract Dentures Atrial Fibrillation Parkinson's Disease  Review of Systems General Not Present- Chills, Fatigue, Fever, Memory Loss, Night Sweats, Weight Gain and Weight Loss. Skin Not Present- Eczema, Hives, Itching, Lesions and Rash. HEENT Present- Dentures. Not Present- Double Vision, Headache, Hearing Loss, Tinnitus and Visual Loss. Respiratory Not Present- Allergies, Chronic Cough, Coughing up blood, Shortness of breath at rest and Shortness of breath with exertion. Cardiovascular Not Present- Chest Pain, Difficulty Breathing Lying Down, Murmur, Palpitations, Racing/skipping heartbeats and Swelling. Gastrointestinal Not Present- Abdominal Pain, Bloody Stool, Constipation, Diarrhea,  Difficulty Swallowing, Heartburn, Jaundice, Loss of appetitie, Nausea and Vomiting. Male Genitourinary Present- Urinating at Night. Not Present- Blood in Urine, Discharge, Flank Pain, Incontinence, Painful Urination, Urgency, Urinary frequency, Urinary Retention and Weak urinary stream. Musculoskeletal Present- Joint Pain, Joint Swelling and Morning Stiffness. Not Present- Back Pain, Muscle Pain,  Muscle Weakness and Spasms. Neurological Present- Difficulty with balance. Not Present- Blackout spells, Dizziness, Paralysis, Tremor and Weakness. Psychiatric Not Present- Insomnia.  Physical Exam General Mental Status -Alert, cooperative and good historian. General Appearance-pleasant, Not in acute distress. Orientation-Oriented X3. Build & Nutrition-Well nourished and Well developed.  Head and Neck Head-normocephalic, atraumatic . Neck Global Assessment - supple, no bruit auscultated on the right, no bruit auscultated on the left.  Eye Pupil - Bilateral-Regular and Round. Motion - Bilateral-EOMI.  Chest and Lung Exam Auscultation Breath sounds - clear at anterior chest wall and clear at posterior chest wall. Adventitious sounds - No Adventitious sounds.  Cardiovascular Auscultation Rhythm - Irregularly irregular. Murmurs & Other Heart Sounds - Auscultation of the heart reveals - No Murmurs.  Abdomen Palpation/Percussion Tenderness - Abdomen is non-tender to palpation. Rigidity (guarding) - Abdomen is soft. Auscultation Auscultation of the abdomen reveals - Bowel sounds normal.  Male Genitourinary Note: Not done, not pertinent to present illness  Musculoskeletal Note: On exam, he is in mild distress. Mood and affect is appropriate. Severely tender in the medial joint line. Left knee patellofemoral pain with compression. Crepitus is noted. Slight valgus. Mild-to-moderate effusion. He is tender on the right medial joint line with patellofemoral pain with compression.  No DVT. Range is 0 to 90.  Imaging AP, standing and repeat demonstrates bone-on-bone arthrosis, lateral compartment and patellofemoral joint minimal varus.  Assessment & Plan Primary osteoarthritis of left knee (M17.12)  Pt with end-stage Left knee DJD, bone-on-bone, refractory to conservative tx, scheduled for Left total knee replacement by Dr. Tonita Cong 11/30/14. We again discussed the procedure itself as well as risks, complications and alternatives, including but not limited to DVT, PE, infx, bleeding, failure of procedure, need for secondary procedure including manipulation, nerve injury, ongoing pain/symptoms, anesthesia risk, even stroke or death. Also discussed typical post-op protocols, activity restrictions, need for PT, flexion/extension exercises, time out of work. Discussed need for DVT ppx post-op with Xarelto then ASA per protocol. Discussed dental ppx. Also discussed limitations post-operatively such as kneeling and squatting. All questions were answered. Patient desires to proceed with surgery as scheduled. He's been cleared medically. Will hold vitamins and supplements accordingly, directed to hold coumadin x 5 days by the coumadin clinic, does not require bridging (verified by phone with Gay Filler at [facility] 8/30). Will remain NPO after MN night before surgery. Will present to [facility] for pre-op testing. Plan Percocet, Colace. Will resume Coumadin post-op and likely bridge with Lovenox to decrease risk of DVT while waiting for therapeutic INR. Anticipate 3-4 day hospital stay due to his medical hx and have asked neuro to follow in house and if they are unable, to recommend if hospitalists can help manage this. I have left a message for Dr. Guadelupe Sabin nurse to return my call regarding any other medication recommendations. Plan home with HHPT post-op with family members at home for assistance. Will follow up 10-14 days post-op for staple removal and xrays.  Plan Left total knee  replacement  Signed electronically by Cecilie Kicks, PA-C for Dr. Tonita Cong

## 2014-11-30 NOTE — Anesthesia Postprocedure Evaluation (Signed)
Anesthesia Post Note  Patient: Brian [name]  Procedure(s) Performed: Procedure(s) (LRB): TOTAL KNEE ARTHROPLASTY (Left)  Anesthesia type: general  Patient location: PACU  Post pain: Pain level controlled  Post assessment: Patient's Cardiovascular Status Stable  Last Vitals:  Filed Vitals:   11/30/14 1245  BP: 142/72  Pulse: 92  Temp: 37.1 C  Resp: 16    Post vital signs: Reviewed and stable  Level of consciousness: sedated  Complications: No apparent anesthesia complications

## 2014-11-30 NOTE — Progress Notes (Signed)
AssistedDr. Ossey with left, ultrasound guided, adductor canal block. Side rails up, monitors on throughout procedure. See vital signs in flow sheet. Tolerated Procedure well.  

## 2014-11-30 NOTE — Progress Notes (Signed)
ANTICOAGULATION CONSULT NOTE - Initial Consult  Pharmacy Consult for Coumadin Indication: VTE prophylaxis post-op; Afib  Allergies  Allergen Reactions  . Lisinopril Other (See Comments)    coughing    Patient Measurements: Height: 6\' 2"  (188 cm) Weight: 248 lb (112.492 kg) IBW/kg (Calculated) : 82.2  Vital Signs: Temp: 98.7 F (37.1 C) (09/08 1245) Temp Source: Oral (09/08 0539) BP: 142/72 mmHg (09/08 1245) Pulse Rate: 92 (09/08 1245)  Labs:  Recent Labs  11/30/14 0624  LABPROT 16.3*  INR 1.30    Estimated Creatinine Clearance: 74.8 mL/min (by C-G formula based on Cr of 1.19).   Medical History: Past Medical History  Diagnosis Date  . Dyslipidemia   . Mitral regurgitation     Mild  . Abnormal PFT 1. 05/18/08  2. 11/30/08    1. Showed mild airflow obstruction, mild restriction, mild diffusion defect; FEV1 2.22(64%), FVC 3.33(65%), FEVi% 67, TLC 5.19(69%), DLCO 77%, +BD  2. FEV1 2.38(73%), FVC 3.81(80%), FEV1% 63, TLC 5.61(80%), DLCO 79%, no BD  . Parkinsonism 06/22/2012  . Paresthesias 06/22/2012  . Hyperlipidemia   . GERD (gastroesophageal reflux disease)   . Erectile dysfunction   . BPH (benign prostatic hyperplasia)   . Restrictive lung disease   . CKD (chronic kidney disease) stage 2, GFR 60-89 ml/min   . Cellulitis     right leg MRSA  . Osteoarthritis   . Allergic rhinitis   . Anxiety   . Hypertension   . Atrial fibrillation     persistent afib  . Heart murmur     as a teenager     Medications:  Prescriptions prior to admission  Medication Sig Dispense Refill Last Dose  . amLODipine (NORVASC) 10 MG tablet TAKE ONE TABLET BY MOUTH ONCE DAILY. 30 tablet 0 11/30/2014 at 0445  . BENICAR 40 MG tablet TAKE ONE TABLET BY MOUTH ONCE DAILY 30 tablet 4 11/29/2014 at Unknown time  . BYSTOLIC 10 MG tablet Take 10 mg by mouth daily.    11/30/2014 at 0445  . carbidopa-levodopa (SINEMET CR) 50-200 MG per tablet Take 1 tablet by mouth at bedtime. 90 tablet 3 11/29/2014 at  2000  . carbidopa-levodopa (SINEMET IR) 25-100 MG per tablet Take 1.5 tablets by mouth 4 (four) times daily. 7 am, 11 am, 3 pm and 7 pm 540 tablet 3 11/30/2014 at 0445  . Cholecalciferol (VITAMIN D3) 1000 UNITS CAPS Take 1,000 Units by mouth daily.    11/29/2014 at Unknown time  . clonazePAM (KLONOPIN) 0.5 MG disintegrating tablet Take 1 tablet (0.5 mg total) by mouth at bedtime. 90 tablet 3 11/29/2014 at 2000  . doxazosin (CARDURA) 2 MG tablet TAKE THREE TABLETS BY MOUTH ONCE DAILY. (Patient taking differently: 1 tablet TID) 90 tablet 0 11/30/2014 at 0445  . escitalopram (LEXAPRO) 5 MG tablet Take 1 tablet (5 mg total) by mouth daily. 90 tablet 3 11/30/2014 at 0445  . famotidine (PEPCID) 40 MG tablet Take 40 mg by mouth daily.    11/30/2014 at 0445  . furosemide (LASIX) 20 MG tablet Take 20 mg by mouth 2 (two) times daily.    11/29/2014 at Unknown time  . gabapentin (NEURONTIN) 100 MG capsule Take 1 capsule (100 mg total) by mouth 3 (three) times daily. 270 capsule 3 11/29/2014 at Unknown time  . ketoconazole (NIZORAL) 2 % cream Apply 2 application topically 2 (two) times a week.   Taking  . KLOR-CON M10 10 MEQ tablet Take 1 tablet by mouth daily.   11/29/2014 at  Unknown time  . Multiple Vitamins-Minerals (CENTRUM SILVER PO) Take 1 tablet by mouth daily.    11/29/2014 at Unknown time  . oxybutynin (DITROPAN) 5 MG tablet Take 5 mg by mouth daily.    11/29/2014 at Unknown time  . pravastatin (PRAVACHOL) 40 MG tablet Take 40 mg by mouth daily.    11/29/2014 at Unknown time  . warfarin (COUMADIN) 5 MG tablet Take as directed by  Coumadin clinic (Patient taking differently: Take 5 mg by mouth daily. Take as directed by  Coumadin clinic) 135 tablet 1 11/24/2014    Assessment: 74 yo M admitted for L TKA.  He is on chronic Coumadin for Afib.  INR is at baseline after being held x 5 days pre-op (last dose 11/24/14).  Per Coumadin clinic records he has been therapeutic on 5mg  daily for >6 months.   11/30/2014:   INR  sub-therapeutic.   No major drug interactions  No bleeding issues per nursing  Clear liquid diet  Goal of Therapy:  INR 2-3 Monitor platelets by anticoagulation protocol: Yes   Plan:  Coumadin 5mg  po x1 today Daily INR Lovenox 30mg  sq daily until INR >2 Monitor CBC at least q72h  Biagio Borg 11/30/2014,1:14 PM

## 2014-11-30 NOTE — Consult Note (Signed)
[facility] Medical Consultation  Brian [name] [name] DOB: 02-28-1941 DOA: 11/30/2014 PCP: Wenda Low, MD   Requesting physician:Dr Beane Date of consultation: 11/30/14 Reason for consultation: manage HTN and A-fib  Impression/Recommendations Principal Problem:   Primary osteoarthritis of left knee - Per ortho service  Active Problems:   Atrial fibrillation with RVR -Possibly related to medications given during procedure (received bupivacaine-epinephrine) or stress and hopefully should resolve in next 24 hours -Improved with 1 dose of 5 mg IV Lopressor and heart rate now in the low 100s -he took one dose of Bystolic already this morning-have given him a second dose of bystolic  -Have placed him on PRN IV Lopressor for rapid heart rate - CHA2DS2-VASc Score 2 - spoke with his cardiologist Fransico Him- no need to bridge- can simply resume Coumadin which I have done    Parkinsonism - Continue Sinemet at home dose:1 1/2 pills at all 4 dose times: 7 AM, 11 AM, 3 PM and 7 PM for a total of 6 pills daily.  -On Lexapro at bedtime and gabapentin 3 times a day this as well   Essential hypertension -Continue Bystolic (has received 2 doses today-see above) and when necessary Lopressor for HR control - as he is on above, hold Norvasc and ARB for now to prevent Hypotension (he took them this AM)- reassess tomorrow AM for need to resume  Pedal edema -He takes Lasix for this-no history of heart failure her family and per cardiology notes -Hold Lasix for now and use it when necessary if he develops edema  BPH -Currently has Foley-resume Cardura to prevent urinary retention  Urinary urgency -Continue oxybutynin    Restrictive lung disease (Listed in Epic) -Not on home O2    CKD (chronic kidney disease) stage 2, GFR 60-89 ml/min -Follow  [facility] will followup again tomorrow. Please contact me if I can be of assistance in the meanwhile. Thank you for this  consultation.  Chief Complaint: Consult request to manage hypertension and A. fib  HPI:  This is a 74 year old male with parkinsonism, BPH, hypertension, A. fib on Coumadin who is admitted to the hospital after left total knee arthroplasty. Dr. Tonita Cong initially requested a consult for management of hypertension. While the patient was in the PACU, he developed A. fib with RVR which we will be managing as well. The patient is currently in and out of consciousness due to sedation. History is obtained from his wife. Other than recent issues with difficulty ambulating secondary to knee pain, he has been doing overall well. Despite his Parkinson's he is able to take care of his ADLs.  Review of Systems  Unable to obtain due to altered level of consciousness postop   Past Medical History  Diagnosis Date  . Dyslipidemia   . Mitral regurgitation     Mild  . Abnormal PFT 1. 05/18/08  2. 11/30/08    1. Showed mild airflow obstruction, mild restriction, mild diffusion defect; FEV1 2.22(64%), FVC 3.33(65%), FEVi% 67, TLC 5.19(69%), DLCO 77%, +BD  2. FEV1 2.38(73%), FVC 3.81(80%), FEV1% 63, TLC 5.61(80%), DLCO 79%, no BD  . Parkinsonism 06/22/2012  . Paresthesias 06/22/2012  . Hyperlipidemia   . GERD (gastroesophageal reflux disease)   . Erectile dysfunction   . BPH (benign prostatic hyperplasia)   . Restrictive lung disease   . CKD (chronic kidney disease) stage 2, GFR 60-89 ml/min   . Cellulitis     right leg MRSA  . Osteoarthritis   . Allergic rhinitis   .  Anxiety   . Hypertension   . Atrial fibrillation     persistent afib  . Heart murmur     as a teenager    Past Surgical History  Procedure Laterality Date  . Cataract extraction    . Cardioversion      multiple  . Cardiac catheterization  11/2009    normal coronary arteries  . Colonoscopy     Social History:  reports that he has never smoked. He has never used smokeless tobacco. He reports that he does not drink alcohol or use illicit  drugs.  Lives with wife at- does most of his ADLs- has been having trouble walking due to his knee.  Allergies  Allergen Reactions  . Lisinopril Other (See Comments)    coughing   Family History  Problem Relation Age of Onset  . Diabetes Mother     DM  . Stroke Father     CVA  . Pancreatic cancer Brother   . Pancreatic cancer Other     Nephew    Prior to Admission medications   Medication Sig Start Date End Date Taking? Authorizing Provider  amLODipine (NORVASC) 10 MG tablet TAKE ONE TABLET BY MOUTH ONCE DAILY. 01/30/14  Yes Sueanne Margarita, MD  BENICAR 40 MG tablet TAKE ONE TABLET BY MOUTH ONCE DAILY 04/12/14  Yes Sueanne Margarita, MD  BYSTOLIC 10 MG tablet Take 10 mg by mouth daily.  08/07/14  Yes Historical Provider, MD  carbidopa-levodopa (SINEMET CR) 50-200 MG per tablet Take 1 tablet by mouth at bedtime. 03/27/14  Yes Star Age, MD  carbidopa-levodopa (SINEMET IR) 25-100 MG per tablet Take 1.5 tablets by mouth 4 (four) times daily. 7 am, 11 am, 3 pm and 7 pm 08/22/14  Yes Star Age, MD  Cholecalciferol (VITAMIN D3) 1000 UNITS CAPS Take 1,000 Units by mouth daily.    Yes Historical Provider, MD  clonazePAM (KLONOPIN) 0.5 MG disintegrating tablet Take 1 tablet (0.5 mg total) by mouth at bedtime. 06/01/14  Yes Star Age, MD  doxazosin (CARDURA) 2 MG tablet TAKE THREE TABLETS BY MOUTH ONCE DAILY. Patient taking differently: 1 tablet TID 04/25/14  Yes Sueanne Margarita, MD  escitalopram (LEXAPRO) 5 MG tablet Take 1 tablet (5 mg total) by mouth daily. 03/27/14  Yes Star Age, MD  famotidine (PEPCID) 40 MG tablet Take 40 mg by mouth daily.  11/19/12  Yes Historical Provider, MD  furosemide (LASIX) 20 MG tablet Take 20 mg by mouth 2 (two) times daily.  05/31/12  Yes Historical Provider, MD  gabapentin (NEURONTIN) 100 MG capsule Take 1 capsule (100 mg total) by mouth 3 (three) times daily. 03/27/14  Yes Star Age, MD  ketoconazole (NIZORAL) 2 % cream Apply 2 application topically 2 (two) times a  week. 09/18/13  Yes Historical Provider, MD  KLOR-CON M10 10 MEQ tablet Take 1 tablet by mouth daily. 09/05/13  Yes Historical Provider, MD  Multiple Vitamins-Minerals (CENTRUM SILVER PO) Take 1 tablet by mouth daily.    Yes Historical Provider, MD  oxybutynin (DITROPAN) 5 MG tablet Take 5 mg by mouth daily.  05/18/14  Yes Historical Provider, MD  pravastatin (PRAVACHOL) 40 MG tablet Take 40 mg by mouth daily.    Yes Historical Provider, MD  warfarin (COUMADIN) 5 MG tablet Take as directed by  Coumadin clinic Patient taking differently: Take 5 mg by mouth daily. Take as directed by  Coumadin clinic 07/21/14  Yes Sueanne Margarita, MD    Physical Exam: Blood pressure  142/72, pulse 92, temperature 98.7 F (37.1 C), temperature source Oral, resp. rate 16, height 6\' 2"  (1.88 m), weight 112.492 kg (248 lb), SpO2 96 %. @VITALS2 @ [employer]   11/30/14 0641  Weight: 112.492 kg (248 lb)    Intake/Output Summary (Last 24 hours) at 11/30/14 1357 Last data filed at 11/30/14 1230  Gross per 24 hour  Intake   2500 ml  Output   1225 ml  Net   1275 ml     Constitutional: Appears well-developed and well-nourished. No distress. HENT: Normocephalic. External right and left ear normal. Oropharynx is clear-tongue is dry Eyes: Conjunctivae and EOM are normal. PERRLA, no scleral icterus.  Neck: Normal ROM. Neck supple. No JVD. No tracheal deviation. No thyromegaly.  CVS: IIRR, S1/S2 +, no murmurs, no gallops, no carotid bruit.  Pulmonary: Effort and breath sounds normal, no stridor, rhonchi, wheezes, rales.  Abdominal: Soft. BS +,  no distension, tenderness, rebound or guarding.  Musculoskeletal:  No edema in right leg-left leg in a mobilizer  Neuro: In and out of consciousness- difficult to assess-when he wakes up intermittently, his speech is clear and he is oriented to person and place Skin: Skin is warm and dry. No rash noted. Not diaphoretic. No erythema. No pallor.  Psychiatric: Difficult to assess  at this time   Labs on Admission:  Basic Metabolic Panel:  Recent Labs Lab 11/23/14 1435  NA 141  K 4.3  CL 105  CO2 26  GLUCOSE 123*  BUN 12  CREATININE 1.19  CALCIUM 9.7   Liver Function Tests: No results for input(s): AST, ALT, ALKPHOS, BILITOT, PROT, ALBUMIN in the last 168 hours. No results for input(s): LIPASE, AMYLASE in the last 168 hours. No results for input(s): AMMONIA in the last 168 hours. CBC:  Recent Labs Lab 11/23/14 1435  WBC 4.0  HGB 13.9  HCT 40.8  MCV 93.2  PLT 161   Cardiac Enzymes: No results for input(s): CKTOTAL, CKMB, CKMBINDEX, TROPONINI in the last 168 hours. BNP: Invalid input(s): POCBNP CBG: No results for input(s): GLUCAP in the last 168 hours.  Radiological Exams on Admission: Dg Knee Left Port  11/30/2014   CLINICAL DATA:  Postoperative left knee radiograph, post knee arthroplasty.  EXAM: PORTABLE LEFT KNEE - 1-2 VIEW  COMPARISON:  11/23/2014  FINDINGS: There has been interval total knee arthroplasty. The prosthetic components are well seated. No evidence of immediate complications. There are expected postsurgical changes such is soft tissue swelling and subcutaneous emphysema. Gas within the suprapatellar joint space is also seen. Skin staples are noted.  IMPRESSION: Status post total left knee arthroplasty. No evidence of immediate complications.   Electronically Signed   By: Fidela Salisbury M.D.   On: 11/30/2014 11:42    EKG: Independently reviewed. Telemetry showing A. fib  Time spent: 55 min   [place], MD [facility] To page rounding or on call physician  [email or web address] 11/30/2014, 1:57 PM

## 2014-11-30 NOTE — Anesthesia Procedure Notes (Addendum)
Procedure Name: Intubation Date/Time: 11/30/2014 7:56 AM Performed by: Sherian Maroon A Pre-anesthesia Checklist: Patient identified, Emergency Drugs available, Suction available, Patient being monitored and Timeout performed Patient Re-evaluated:Patient Re-evaluated prior to inductionOxygen Delivery Method: Circle system utilized Preoxygenation: Pre-oxygenation with 100% oxygen Intubation Type: IV induction Ventilation: Mask ventilation without difficulty Laryngoscope Size: Mac and 4 Grade View: Grade III Tube type: Oral Number of attempts: 1 Airway Equipment and Method: Stylet Placement Confirmation: ETT inserted through vocal cords under direct vision,  breath sounds checked- equal and bilateral and positive ETCO2 Secured at: 23 cm Tube secured with: Tape Dental Injury: Teeth and Oropharynx as per pre-operative assessment    Anesthesia Regional Block:  Adductor canal block  Pre-Anesthetic Checklist: ,, timeout performed, Correct Patient, Correct Site, Correct Laterality, Correct Procedure, Correct Position, site marked, Risks and benefits discussed,  Surgical consent,  Pre-op evaluation,  At surgeon's request and post-op pain management  Laterality: Left  Prep: chloraprep       Needles:  Injection technique: Single-shot  Needle Type: Echogenic Stimulator Needle     Needle Length: 9cm 9 cm Needle Gauge: 21 and 21 G    Additional Needles:  Procedures: ultrasound guided (picture in chart) Adductor canal block Narrative:  Start time: 11/30/2014 7:15 AM End time: 11/30/2014 7:25 AM Injection made incrementally with aspirations every 5 mL.  Performed by: Personally  Anesthesiologist: Lillia Abed  Additional Notes: Monitors applied. Patient sedated. Sterile prep and drape,hand hygiene and sterile gloves were used. Relevant anatomy identified.Needle position confirmed.Local anesthetic injected incrementally after negative aspiration. Local anesthetic spread visualized  around nerve(s). Vascular puncture avoided. No complications. Image printed for medical record.The patient tolerated the procedure well.    Lillia Abed MD

## 2014-11-30 NOTE — Anesthesia Preprocedure Evaluation (Addendum)
Anesthesia Evaluation  Patient identified by MRN, date of birth, ID band Patient awake    Reviewed: Allergy & Precautions, NPO status , Patient's Chart, lab work & pertinent test results  Airway Mallampati: II  TM Distance: >3 FB Neck ROM: Full    Dental   Pulmonary    Pulmonary exam normal        Cardiovascular hypertension, Pt. on medications Normal cardiovascular exam     Neuro/Psych    GI/Hepatic GERD  Medicated and Controlled,  Endo/Other    Renal/GU CRFRenal disease     Musculoskeletal   Abdominal   Peds  Hematology   Anesthesia Other Findings   Reproductive/Obstetrics                            Anesthesia Physical Anesthesia Plan  ASA: III  Anesthesia Plan: General   Post-op Pain Management: GA combined w/ Regional for post-op pain   Induction: Intravenous  Airway Management Planned: Oral ETT  Additional Equipment:   Intra-op Plan:   Post-operative Plan: Extubation in OR  Informed Consent: I have reviewed the patients History and Physical, chart, labs and discussed the procedure including the risks, benefits and alternatives for the proposed anesthesia with the patient or authorized representative who has indicated his/her understanding and acceptance.     Plan Discussed with: CRNA and Surgeon  Anesthesia Plan Comments:         Anesthesia Quick Evaluation

## 2014-11-30 NOTE — Brief Op Note (Signed)
11/30/2014  10:03 AM  PATIENT:  Brian [name]  74 y.o. male  PRE-OPERATIVE DIAGNOSIS:  DEGENERATIVE JOINT DIDEASE LEFT KNEE  POST-OPERATIVE DIAGNOSIS:  DEGENERATIVE JOINT DIDEASE LEFT KNEE  PROCEDURE:  Procedure(s): TOTAL KNEE ARTHROPLASTY (Left)  SURGEON:  Surgeon(s) and Role:    * Susa Day, MD - Primary  PHYSICIAN ASSISTANT:   ASSISTANTS: Bissell   ANESTHESIA:   general  EBL:  Total I/O In: 2000 [I.V.:2000] Out: 1075 [Urine:975; Blood:100]  BLOOD ADMINISTERED:none  DRAINS: none   LOCAL MEDICATIONS USED:  MARCAINE     SPECIMEN:  No Specimen  DISPOSITION OF SPECIMEN:  N/A  COUNTS:  YES  TOURNIQUET:   Total Tourniquet Time Documented: Thigh (Left) - 86 minutes Total: Thigh (Left) - 86 minutes   DICTATION: .Other Dictation: Dictation Number [id number]  PLAN OF CARE: Admit to inpatient   PATIENT DISPOSITION:  PACU - hemodynamically stable.   Delay start of Pharmacological VTE agent (>24hrs) due to surgical blood loss or risk of bleeding: no

## 2014-11-30 NOTE — Discharge Summary (Signed)
Physician Discharge Summary   Patient ID: Brian [name] MRN: [id number] DOB/AGE: May 01, 1940 74 y.o.  Admit date: 11/30/2014 Discharge date: anticipate 12/03/2014  Primary Diagnosis: left knee primary osteoarthritis  Admission Diagnoses:  Past Medical History  Diagnosis Date  . Dyslipidemia   . Mitral regurgitation     Mild  . Abnormal PFT 1. 05/18/08  2. 11/30/08    1. Showed mild airflow obstruction, mild restriction, mild diffusion defect; FEV1 2.22(64%), FVC 3.33(65%), FEVi% 67, TLC 5.19(69%), DLCO 77%, +BD  2. FEV1 2.38(73%), FVC 3.81(80%), FEV1% 63, TLC 5.61(80%), DLCO 79%, no BD  . Parkinsonism 06/22/2012  . Paresthesias 06/22/2012  . Hyperlipidemia   . GERD (gastroesophageal reflux disease)   . Erectile dysfunction   . BPH (benign prostatic hyperplasia)   . Restrictive lung disease   . CKD (chronic kidney disease) stage 2, GFR 60-89 ml/min   . Cellulitis     right leg MRSA  . Osteoarthritis   . Allergic rhinitis   . Anxiety   . Hypertension   . Atrial fibrillation     persistent afib  . Heart murmur     as a teenager    Discharge Diagnoses:   Principal Problem:   Primary osteoarthritis of left knee Active Problems:   Essential hypertension   Atrial fibrillation   Parkinsonism   Restrictive lung disease   CKD (chronic kidney disease) stage 2, GFR 60-89 ml/min  Estimated body mass index is 31.83 kg/(m^2) as calculated from the following:   Height as of this encounter: _0  (1.88 m).   Weight as of this encounter: 112.492 kg (248 lb).  Procedure:  Procedure(s) (LRB): TOTAL KNEE ARTHROPLASTY (Left)   Consults: hospitalists for A.Fib and Parkinson's mgmt  HPI: see H&P Laboratory Data: Admission on 11/30/2014  Component Date Value Ref Range Status  . Prothrombin Time 11/30/2014 16.3* 11.6 - 15.2 seconds Final  . INR 11/30/2014 1.30  0.00 - 1.49 Final  . WBC 12/01/2014 8.3  4.0 - 10.5 K/uL Final  . RBC 12/01/2014 3.99* 4.22 - 5.81 MIL/uL Final  . Hemoglobin  12/01/2014 12.8* 13.0 - 17.0 g/dL Final  . HCT 12/01/2014 36.6* 39.0 - 52.0 % Final  . MCV 12/01/2014 91.7  78.0 - 100.0 fL Final  . MCH 12/01/2014 32.1  26.0 - 34.0 pg Final  . MCHC 12/01/2014 35.0  30.0 - 36.0 g/dL Final  . RDW 12/01/2014 13.2  11.5 - 15.5 % Final  . Platelets 12/01/2014 171  150 - 400 K/uL Final  . Sodium 12/01/2014 136  135 - 145 mmol/L Final  . Potassium 12/01/2014 3.8  3.5 - 5.1 mmol/L Final  . Chloride 12/01/2014 103  101 - 111 mmol/L Final  . CO2 12/01/2014 25  22 - 32 mmol/L Final  . Glucose, Bld 12/01/2014 138* 65 - 99 mg/dL Final  . BUN 12/01/2014 11  6 - 20 mg/dL Final  . Creatinine, Ser 12/01/2014 0.90  0.61 - 1.24 mg/dL Final  . Calcium 12/01/2014 9.2  8.9 - 10.3 mg/dL Final  . GFR calc non Af Amer 12/01/2014 >60  >60 mL/min Final  . GFR calc Af Amer 12/01/2014 >60  >60 mL/min Final   Comment: (NOTE) The eGFR has been calculated using the CKD EPI equation. This calculation has not been validated in all clinical situations. eGFR's persistently <60 mL/min signify possible Chronic Kidney Disease.   . Anion gap 12/01/2014 8  5 - 15 Final  . Prothrombin Time 12/01/2014 17.3* 11.6 - 15.2 seconds Final  .  INR 12/01/2014 1.41  0.00 - 1.49 Final  . WBC 12/02/2014 8.4  4.0 - 10.5 K/uL Final  . RBC 12/02/2014 4.10* 4.22 - 5.81 MIL/uL Final  . Hemoglobin 12/02/2014 13.0  13.0 - 17.0 g/dL Final  . HCT 12/02/2014 38.0* 39.0 - 52.0 % Final  . MCV 12/02/2014 92.7  78.0 - 100.0 fL Final  . MCH 12/02/2014 31.7  26.0 - 34.0 pg Final  . MCHC 12/02/2014 34.2  30.0 - 36.0 g/dL Final  . RDW 12/02/2014 13.4  11.5 - 15.5 % Final  . Platelets 12/02/2014 157  150 - 400 K/uL Final  . Prothrombin Time 12/02/2014 19.3* 11.6 - 15.2 seconds Final  . INR 12/02/2014 1.63* 0.00 - 1.49 Final  . WBC 12/03/2014 8.7  4.0 - 10.5 K/uL Final  . RBC 12/03/2014 3.88* 4.22 - 5.81 MIL/uL Final  . Hemoglobin 12/03/2014 12.2* 13.0 - 17.0 g/dL Final  . HCT 12/03/2014 36.2* 39.0 - 52.0 % Final    . MCV 12/03/2014 93.3  78.0 - 100.0 fL Final  . MCH 12/03/2014 31.4  26.0 - 34.0 pg Final  . MCHC 12/03/2014 33.7  30.0 - 36.0 g/dL Final  . RDW 12/03/2014 13.4  11.5 - 15.5 % Final  . Platelets 12/03/2014 137* 150 - 400 K/uL Final  . Prothrombin Time 12/03/2014 21.0* 11.6 - 15.2 seconds Final  . INR 12/03/2014 1.82* 0.00 - 1.49 Final  Hospital Outpatient Visit on 11/23/2014  Component Date Value Ref Range Status  . Sodium 11/23/2014 141  135 - 145 mmol/L Final  . Potassium 11/23/2014 4.3  3.5 - 5.1 mmol/L Final  . Chloride 11/23/2014 105  101 - 111 mmol/L Final  . CO2 11/23/2014 26  22 - 32 mmol/L Final  . Glucose, Bld 11/23/2014 123* 65 - 99 mg/dL Final  . BUN 11/23/2014 12  6 - 20 mg/dL Final  . Creatinine, Ser 11/23/2014 1.19  0.61 - 1.24 mg/dL Final  . Calcium 11/23/2014 9.7  8.9 - 10.3 mg/dL Final  . GFR calc non Af Amer 11/23/2014 59* >60 mL/min Final  . GFR calc Af Amer 11/23/2014 >60  >60 mL/min Final   Comment: (NOTE) The eGFR has been calculated using the CKD EPI equation. This calculation has not been validated in all clinical situations. eGFR's persistently <60 mL/min signify possible Chronic Kidney Disease.   . Anion gap 11/23/2014 10  5 - 15 Final  . WBC 11/23/2014 4.0  4.0 - 10.5 K/uL Final  . RBC 11/23/2014 4.38  4.22 - 5.81 MIL/uL Final  . Hemoglobin 11/23/2014 13.9  13.0 - 17.0 g/dL Final  . HCT 11/23/2014 40.8  39.0 - 52.0 % Final  . MCV 11/23/2014 93.2  78.0 - 100.0 fL Final  . MCH 11/23/2014 31.7  26.0 - 34.0 pg Final  . MCHC 11/23/2014 34.1  30.0 - 36.0 g/dL Final  . RDW 11/23/2014 13.6  11.5 - 15.5 % Final  . Platelets 11/23/2014 161  150 - 400 K/uL Final  . Prothrombin Time 11/23/2014 26.9* 11.6 - 15.2 seconds Final  . INR 11/23/2014 2.52* 0.00 - 1.49 Final  . ABO/RH(D) 11/23/2014 O POS   Final  . Antibody Screen 11/23/2014 NEG   Final  . Sample Expiration 11/23/2014 12/03/2014   Final  . Color, Urine 11/23/2014 YELLOW  YELLOW Final  . APPearance  11/23/2014 CLEAR  CLEAR Final  . Specific Gravity, Urine 11/23/2014 1.019  1.005 - 1.030 Final  . pH 11/23/2014 6.0  5.0 - 8.0 Final  .  Glucose, UA 11/23/2014 NEGATIVE  NEGATIVE mg/dL Final  . Hgb urine dipstick 11/23/2014 NEGATIVE  NEGATIVE Final  . Bilirubin Urine 11/23/2014 NEGATIVE  NEGATIVE Final  . Ketones, ur 11/23/2014 NEGATIVE  NEGATIVE mg/dL Final  . Protein, ur 11/23/2014 NEGATIVE  NEGATIVE mg/dL Final  . Urobilinogen, UA 11/23/2014 1.0  0.0 - 1.0 mg/dL Final  . Nitrite 11/23/2014 NEGATIVE  NEGATIVE Final  . Leukocytes, UA 11/23/2014 NEGATIVE  NEGATIVE Final   MICROSCOPIC NOT DONE ON URINES WITH NEGATIVE PROTEIN, BLOOD, LEUKOCYTES, NITRITE, OR GLUCOSE <1000 mg/dL.  Marland Kitchen MRSA, PCR 11/23/2014 NEGATIVE  NEGATIVE Final  . Staphylococcus aureus 11/23/2014 NEGATIVE  NEGATIVE Final   Comment:        The Xpert SA Assay (FDA approved for NASAL specimens in patients over 31 years of age), is one component of a comprehensive surveillance program.  Test performance has been validated by [facility] for patients greater than or equal to 63 year old. It is not intended to diagnose infection nor to guide or monitor treatment.   Marland Kitchen aPTT 11/23/2014 38* 24 - 37 seconds Final   Comment:        IF BASELINE aPTT IS ELEVATED, SUGGEST PATIENT RISK ASSESSMENT BE USED TO DETERMINE APPROPRIATE ANTICOAGULANT THERAPY.   . ABO/RH(D) 11/23/2014 O POS   Final  Anti-coag visit on 11/21/2014  Component Date Value Ref Range Status  . INR 11/21/2014 2.7   Final  Anti-coag visit on 10/24/2014  Component Date Value Ref Range Status  . INR 10/24/2014 2.3   Final     X-Rays:Dg Knee 1-2 Views Left  11/23/2014   CLINICAL DATA:  Osteoarthritis.  Preoperative knee replacement  EXAM: LEFT KNEE - 1-2 VIEW  COMPARISON:  March 20, 2011  FINDINGS: Frontal and lateral views were obtained. There is no fracture or dislocation. There is no appreciable joint effusion. There is calcification in the suprapatellar  bursa. There is marked narrowing of the patellofemoral joint. There is moderately severe narrowing laterally. There is spurring arising from the patella and lateral compartment. There is also intracondylar spurring. No erosive change. There is a small benign appearing exostosis along the medial distal femoral metaphysis, stable.  IMPRESSION: Osteoarthritic change, most marked in the lateral compartment and patellofemoral joint regions. No acute fracture or joint effusion. Note calcification in suprapatellar bursa.   Electronically Signed   By: Lowella Grip III M.D.   On: 11/23/2014 16:50   Dg Knee Left Port  11/30/2014   CLINICAL DATA:  Postoperative left knee radiograph, post knee arthroplasty.  EXAM: PORTABLE LEFT KNEE - 1-2 VIEW  COMPARISON:  11/23/2014  FINDINGS: There has been interval total knee arthroplasty. The prosthetic components are well seated. No evidence of immediate complications. There are expected postsurgical changes such is soft tissue swelling and subcutaneous emphysema. Gas within the suprapatellar joint space is also seen. Skin staples are noted.  IMPRESSION: Status post total left knee arthroplasty. No evidence of immediate complications.   Electronically Signed   By: Fidela Salisbury M.D.   On: 11/30/2014 11:42    EKG: Orders placed or performed in visit on 03/03/14  . EKG 12-Lead     Hospital Course: Brian [name] is a 74 y.o. who was admitted to [facility]. They were brought to the operating room on 11/30/2014 and underwent Procedure(s): TOTAL KNEE ARTHROPLASTY.  Patient tolerated the procedure well and was later transferred to the recovery room and then to the orthopaedic floor for postoperative care.  They were given PO  and IV analgesics for pain control following their surgery.  They were given 24 hours of postoperative antibiotics of  Anti-infectives    Start     Dose/Rate Route Frequency Ordered Stop   11/30/14 1800  vancomycin (VANCOCIN) 1,500 mg in  sodium chloride 0.9 % 500 mL IVPB     1,500 mg 250 mL/hr over 120 Minutes Intravenous Every 12 hours 11/30/14 1312 11/30/14 2014   11/30/14 0848  polymyxin B 500,000 Units, bacitracin 50,000 Units in sodium chloride irrigation 0.9 % 500 mL irrigation  Status:  Discontinued       As needed 11/30/14 0848 11/30/14 1027   11/30/14 0600  vancomycin (VANCOCIN) 1,500 mg in sodium chloride 0.9 % 500 mL IVPB     1,500 mg 250 mL/hr over 120 Minutes Intravenous On call to O.R. 11/29/14 1349 11/30/14 0836     and started on DVT prophylaxis in the form of Lovenox, Coumadin, TED hose and SCDs.   PT and OT were ordered for total joint protocol.  Discharge planning consulted to help with postop disposition and equipment needs.  Patient had a fair night on the evening of surgery.  They started to get up with PT the next day and progressed into day 2. By day three, the patient had progressed with therapy and meeting their goals.  Incision was healing well.  Patient was seen in rounds and was ready to go home. He was bridged with Lovenox while awaiting Coumadin/INR to be therapeutic for A.Fib and DVT ppx. Hospitalists were consulted for mgmt of his multiple medical conditions.  Addendum: Patient with right knee pain and swelling that has been interfering with his mobility. He states that he has had injections into that right knee with benefit in the past. Aspiration/Injection Procedure Note Brian [name] [id number] 05-06-1940 Procedure: Aspiration and Injection Right Knee Indications: Painful effusion Procedure Details Consent: Risks of procedure as well as the alternatives and risks of each were explained to the (patient/caregiver). Consent for procedure obtained. Local Anesthesia Used:Lidocaine 1% plain; 57m Amount of Fluid Aspirated: 637mCharacter of Fluid: clear and straw colored Fluid not sent A sterile dressing was applied.  Patient did tolerate procedure well. Estimated blood loss: Zero  PEMickel Crow/01/2015, 2:33 PM  Diet: Regular diet Activity:WBAT Follow-up:in 14 days Disposition - [facility] Discharged Condition: good       Discharge Instructions    Discharge instructions    Complete by:  As directed   Check BP daily and resume Norvasc and Benicar if needed.            Medication List    STOP taking these medications        amLODipine 10 MG tablet  Commonly known as:  NORVASC     BENICAR 40 MG tablet  Generic drug:  olmesartan     CENTRUM SILVER PO      TAKE these medications        BYSTOLIC 10 MG tablet  Generic drug:  nebivolol  Take 10 mg by mouth daily.     carbidopa-levodopa 50-200 MG per tablet  Commonly known as:  SINEMET CR  Take 1 tablet by mouth at bedtime.     carbidopa-levodopa 25-100 MG per tablet  Commonly known as:  SINEMET IR  Take 1.5 tablets by mouth 4 (four) times daily. 7 am, 11 am, 3 pm and 7 pm     clonazePAM 0.5 MG disintegrating tablet  Commonly known as:  KLONOPIN  Take 1 tablet (  0.5 mg total) by mouth at bedtime.     doxazosin 2 MG tablet  Commonly known as:  CARDURA  TAKE THREE TABLETS BY MOUTH ONCE DAILY.     escitalopram 5 MG tablet  Commonly known as:  LEXAPRO  Take 1 tablet (5 mg total) by mouth daily.     famotidine 40 MG tablet  Commonly known as:  PEPCID  Take 40 mg by mouth daily.     furosemide 20 MG tablet  Commonly known as:  LASIX  Take 20 mg by mouth 2 (two) times daily.     gabapentin 100 MG capsule  Commonly known as:  NEURONTIN  Take 1 capsule (100 mg total) by mouth 3 (three) times daily.     HYDROcodone-acetaminophen 5-325 MG per tablet  Commonly known as:  NORCO/VICODIN  Take 1-2 tablets by mouth every 4 (four) hours as needed for moderate pain.     ketoconazole 2 % cream  Commonly known as:  NIZORAL  Apply 2 application topically 2 (two) times a week.     KLOR-CON M10 10 MEQ tablet  Generic drug:  potassium chloride  Take 1 tablet by mouth daily.     oxybutynin 5  MG tablet  Commonly known as:  DITROPAN  Take 5 mg by mouth daily.     pravastatin 40 MG tablet  Commonly known as:  PRAVACHOL  Take 40 mg by mouth daily.     traMADol 50 MG tablet  Commonly known as:  ULTRAM  Take 1-2 tablets (50-100 mg total) by mouth every 6 (six) hours as needed (mild pain).     Vitamin D3 1000 UNITS Caps  Take 1,000 Units by mouth daily.     warfarin 5 MG tablet  Commonly known as:  COUMADIN  Take as directed by  Coumadin clinic       Follow-up Information    Follow up with BEANE,JEFFREY C, MD In 2 weeks.   Specialty:  Orthopedic Surgery   Why:  For suture removal   Contact information:   [address] [place] [address] [phone]       Follow up with Johnn Hai, MD In 2 weeks.   Specialty:  Orthopedic Surgery   Contact information:   [address] [place] [address] [phone]       Signed: Lacie Draft, PA-C Orthopaedic Surgery 12/03/2014, 12:40 PM

## 2014-11-30 NOTE — Interval H&P Note (Signed)
History and Physical Interval Note:  11/30/2014 7:27 AM  Brian [name]  has presented today for surgery, with the diagnosis of DJD LEFT KNEE  The various methods of treatment have been discussed with the patient and family. After consideration of risks, benefits and other options for treatment, the patient has consented to  Procedure(s): TOTAL KNEE ARTHROPLASTY (Left) as a surgical intervention .  The patient's history has been reviewed, patient examined, no change in status, stable for surgery.  I have reviewed the patient's chart and labs.  Questions were answered to the patient's satisfaction.     [name] C

## 2014-11-30 NOTE — Transfer of Care (Signed)
Immediate Anesthesia Transfer of Care Note  Patient: Brian [name]  Procedure(s) Performed: Procedure(s): TOTAL KNEE ARTHROPLASTY (Left)  Patient Location: PACU  Anesthesia Type:General  Level of Consciousness: awake, oriented and patient cooperative  Airway & Oxygen Therapy: Patient connected to face mask oxygen  Post-op Assessment: Report given to RN and Post -op Vital signs reviewed and stable  Post vital signs: Reviewed and stable  Last Vitals:  Filed Vitals:   11/30/14 1030  BP: 144/73  Pulse:   Temp:   Resp:     Complications: No apparent anesthesia complications

## 2014-11-30 NOTE — Progress Notes (Signed)
PACU note-----med for elevated heart rate given as ordered

## 2014-11-30 NOTE — Discharge Instructions (Signed)
Elevate leg above heart 6x a day for 44minutes each Use knee immobilizer while walking until can SLR x 10 Use knee immobilizer in bed to keep knee in extension Aquacel dressing may remain in place until follow up. May shower with aquacel dressing in place. If the dressing becomes saturated or peels off, you may remove aquacel dressing. Do not remove steri-strips if they are present. Place new dressing with gauze and tape or ACE bandage which should be kept clean and dry and changed daily. Follow up with coumadin clinic to recheck INR

## 2014-12-01 ENCOUNTER — Encounter (HOSPITAL_COMMUNITY): Payer: Self-pay | Admitting: Specialist

## 2014-12-01 DIAGNOSIS — N182 Chronic kidney disease, stage 2 (mild): Secondary | ICD-10-CM

## 2014-12-01 DIAGNOSIS — I481 Persistent atrial fibrillation: Secondary | ICD-10-CM

## 2014-12-01 LAB — PROTIME-INR
INR: 1.41 (ref 0.00–1.49)
Prothrombin Time: 17.3 seconds — ABNORMAL HIGH (ref 11.6–15.2)

## 2014-12-01 LAB — BASIC METABOLIC PANEL
ANION GAP: 8 (ref 5–15)
BUN: 11 mg/dL (ref 6–20)
CALCIUM: 9.2 mg/dL (ref 8.9–10.3)
CO2: 25 mmol/L (ref 22–32)
Chloride: 103 mmol/L (ref 101–111)
Creatinine, Ser: 0.9 mg/dL (ref 0.61–1.24)
Glucose, Bld: 138 mg/dL — ABNORMAL HIGH (ref 65–99)
Potassium: 3.8 mmol/L (ref 3.5–5.1)
SODIUM: 136 mmol/L (ref 135–145)

## 2014-12-01 LAB — CBC
HCT: 36.6 % — ABNORMAL LOW (ref 39.0–52.0)
Hemoglobin: 12.8 g/dL — ABNORMAL LOW (ref 13.0–17.0)
MCH: 32.1 pg (ref 26.0–34.0)
MCHC: 35 g/dL (ref 30.0–36.0)
MCV: 91.7 fL (ref 78.0–100.0)
PLATELETS: 171 10*3/uL (ref 150–400)
RBC: 3.99 MIL/uL — ABNORMAL LOW (ref 4.22–5.81)
RDW: 13.2 % (ref 11.5–15.5)
WBC: 8.3 10*3/uL (ref 4.0–10.5)

## 2014-12-01 MED ORDER — WARFARIN SODIUM 5 MG PO TABS
5.0000 mg | ORAL_TABLET | Freq: Once | ORAL | Status: AC
  Administered 2014-12-01: 5 mg via ORAL
  Filled 2014-12-01 (×2): qty 1

## 2014-12-01 NOTE — Progress Notes (Signed)
Subjective: 1 Day Post-Op Procedure(s) (LRB): TOTAL KNEE ARTHROPLASTY (Left) Patient reports pain as 2 on 0-10 scale.    Objective: Vital signs in last 24 hours: Temp:  [97.7 F (36.5 C)-98.9 F (37.2 C)] 97.7 F (36.5 C) (09/09 0522) Pulse Rate:  [45-152] 77 (09/09 0522) Resp:  [8-20] 20 (09/09 0522) BP: (125-158)/(58-86) 139/67 mmHg (09/09 0522) SpO2:  [90 %-100 %] 98 % (09/09 0522)  Intake/Output from previous day: 09/08 0701 - 09/09 0700 In: 3744.5 [I.V.:3494.5; IV Piggyback:250] Out: 3875 [Urine:3725; Blood:150] Intake/Output this shift:     Recent Labs  12/01/14 0525  HGB 12.8*    Recent Labs  12/01/14 0525  WBC 8.3  RBC 3.99*  HCT 36.6*  PLT 171    Recent Labs  12/01/14 0525  NA 136  K 3.8  CL 103  CO2 25  BUN 11  CREATININE 0.90  GLUCOSE 138*  CALCIUM 9.2    Recent Labs  11/30/14 0624 12/01/14 0525  INR 1.30 1.41    Neurologically intact Neurovascular intact Intact pulses distally Dorsiflexion/Plantar flexion intact No cellulitis present Compartment soft  Assessment/Plan: 1 Day Post-Op Procedure(s) (LRB): TOTAL KNEE ARTHROPLASTY (Left) Advance diet Up with therapy D/C IV fluids  Luann Aspinwall C 12/01/2014, 7:18 AM

## 2014-12-01 NOTE — Progress Notes (Signed)
[place] for Coumadin Indication: VTE prophylaxis post-op; Afib  Allergies  Allergen Reactions  . Lisinopril Other (See Comments)    coughing    Patient Measurements: Height: 6\' 2"  (188 cm) Weight: 248 lb (112.492 kg) IBW/kg (Calculated) : 82.2  Vital Signs: Temp: 97.7 F (36.5 C) (09/09 0522) Temp Source: Oral (09/09 0522) BP: 139/67 mmHg (09/09 0522) Pulse Rate: 77 (09/09 0522)  Labs:  Recent Labs  11/30/14 0624 12/01/14 0525  HGB  --  12.8*  HCT  --  36.6*  PLT  --  171  LABPROT 16.3* 17.3*  INR 1.30 1.41  CREATININE  --  0.90    Estimated Creatinine Clearance: 99 mL/min (by C-G formula based on Cr of 0.9).   Medical History: Past Medical History  Diagnosis Date  . Dyslipidemia   . Mitral regurgitation     Mild  . Abnormal PFT 1. 05/18/08  2. 11/30/08    1. Showed mild airflow obstruction, mild restriction, mild diffusion defect; FEV1 2.22(64%), FVC 3.33(65%), FEVi% 67, TLC 5.19(69%), DLCO 77%, +BD  2. FEV1 2.38(73%), FVC 3.81(80%), FEV1% 63, TLC 5.61(80%), DLCO 79%, no BD  . Parkinsonism 06/22/2012  . Paresthesias 06/22/2012  . Hyperlipidemia   . GERD (gastroesophageal reflux disease)   . Erectile dysfunction   . BPH (benign prostatic hyperplasia)   . Restrictive lung disease   . CKD (chronic kidney disease) stage 2, GFR 60-89 ml/min   . Cellulitis     right leg MRSA  . Osteoarthritis   . Allergic rhinitis   . Anxiety   . Hypertension   . Atrial fibrillation     persistent afib  . Heart murmur     as a teenager     Medications:  Prescriptions prior to admission  Medication Sig Dispense Refill Last Dose  . amLODipine (NORVASC) 10 MG tablet TAKE ONE TABLET BY MOUTH ONCE DAILY. 30 tablet 0 11/30/2014 at 0445  . BENICAR 40 MG tablet TAKE ONE TABLET BY MOUTH ONCE DAILY 30 tablet 4 11/29/2014 at Unknown time  . BYSTOLIC 10 MG tablet Take 10 mg by mouth daily.    11/30/2014 at 0445  . carbidopa-levodopa (SINEMET CR)  50-200 MG per tablet Take 1 tablet by mouth at bedtime. 90 tablet 3 11/29/2014 at 2000  . carbidopa-levodopa (SINEMET IR) 25-100 MG per tablet Take 1.5 tablets by mouth 4 (four) times daily. 7 am, 11 am, 3 pm and 7 pm 540 tablet 3 11/30/2014 at 0445  . Cholecalciferol (VITAMIN D3) 1000 UNITS CAPS Take 1,000 Units by mouth daily.    11/29/2014 at Unknown time  . clonazePAM (KLONOPIN) 0.5 MG disintegrating tablet Take 1 tablet (0.5 mg total) by mouth at bedtime. 90 tablet 3 11/29/2014 at 2000  . doxazosin (CARDURA) 2 MG tablet TAKE THREE TABLETS BY MOUTH ONCE DAILY. (Patient taking differently: 1 tablet TID) 90 tablet 0 11/30/2014 at 0445  . escitalopram (LEXAPRO) 5 MG tablet Take 1 tablet (5 mg total) by mouth daily. 90 tablet 3 11/30/2014 at 0445  . famotidine (PEPCID) 40 MG tablet Take 40 mg by mouth daily.    11/30/2014 at 0445  . furosemide (LASIX) 20 MG tablet Take 20 mg by mouth 2 (two) times daily.    11/29/2014 at Unknown time  . gabapentin (NEURONTIN) 100 MG capsule Take 1 capsule (100 mg total) by mouth 3 (three) times daily. 270 capsule 3 11/29/2014 at Unknown time  . ketoconazole (NIZORAL) 2 % cream Apply 2 application topically  2 (two) times a week.   Taking  . KLOR-CON M10 10 MEQ tablet Take 1 tablet by mouth daily.   11/29/2014 at Unknown time  . Multiple Vitamins-Minerals (CENTRUM SILVER PO) Take 1 tablet by mouth daily.    11/29/2014 at Unknown time  . oxybutynin (DITROPAN) 5 MG tablet Take 5 mg by mouth daily.    11/29/2014 at Unknown time  . pravastatin (PRAVACHOL) 40 MG tablet Take 40 mg by mouth daily.    11/29/2014 at Unknown time  . warfarin (COUMADIN) 5 MG tablet Take as directed by  Coumadin clinic (Patient taking differently: Take 5 mg by mouth daily. Take as directed by  Coumadin clinic) 135 tablet 1 11/24/2014    Assessment: 74 yo M admitted for L TKA.  He is on chronic Coumadin for Afib.  INR is at baseline after being held x 5 days pre-op (last dose 11/24/14).  Per Coumadin clinic records he has  been therapeutic on 5mg  daily for >6 months.   12/01/2014:   INR sub-therapeutic, but rising to goal   No major drug interactions  No bleeding issues per nursing  Increased to regular diet today  Goal of Therapy:  INR 2-3 Monitor platelets by anticoagulation protocol: Yes   Plan:  Coumadin 5mg  po x1 today Daily INR Lovenox 30mg  sq daily until INR >2 Monitor CBC at least q72h  Biagio Borg 12/01/2014,7:46 AM

## 2014-12-01 NOTE — Clinical Social Work Placement (Signed)
   CLINICAL SOCIAL WORK PLACEMENT  NOTE  Date:  12/01/2014  Patient Details  Name: Brian [name] MRN: [id number] Date of Birth: 01-Feb-1941  Clinical Social Work is seeking post-discharge placement for this patient at the [place] level of care (*CSW will initial, date and re-position this form in  chart as items are completed):  No   Patient/family provided with [facility] Work Department's list of facilities offering this level of care within the geographic area requested by the patient (or if unable, by the patient's family).  Yes   Patient/family informed of their freedom to choose among providers that offer the needed level of care, that participate in Medicare, Medicaid or managed care program needed by the patient, have an available bed and are willing to accept the patient.  No   Patient/family informed of [facility]'s ownership interest in [facility] and [facility], as well as of the fact that they are under no obligation to receive care at these facilities.  PASRR submitted to EDS on 12/01/14     PASRR number received on 12/01/14     Existing PASRR number confirmed on       FL2 transmitted to all facilities in geographic area requested by pt/family on       FL2 transmitted to all facilities within larger geographic area on 12/01/14     Patient informed that his/her managed care company has contracts with or will negotiate with certain facilities, including the following:        Yes   Patient/family informed of bed offers received.  Patient chooses bed at [facility]     Physician recommends and patient chooses bed at [facility]    Patient to be transferred to [facility] on  .  Patient to be transferred to facility by       Patient family notified on   of transfer.  Name of family member notified:        PHYSICIAN       Additional Comment:    _______________________________________________ Luretha Rued,  [place] 12/01/2014, 3:16 PM

## 2014-12-01 NOTE — Evaluation (Signed)
Physical Therapy Evaluation Patient Details Name: Brian [name] MRN: [id number] DOB: 11/06/1940 Today's Date: 12/01/2014   History of Present Illness  s/p L TKA; PMHx: parkinson's, afib  Clinical Impression  Pt admitted with above diagnosis. Pt currently with functional limitations due to the deficits listed below (see PT Problem List).  Pt will benefit from skilled PT to increase their independence and safety with mobility to allow discharge to the venue listed below.   Pt will benefit from PT post acute at SNF level to allow return to independence     Follow Up Recommendations SNF;Supervision/Assistance - 24 hour    Equipment Recommendations  None recommended by PT    Recommendations for Other Services       Precautions / Restrictions Precautions Precautions: Knee Required Braces or Orthoses: Knee Immobilizer - Left Knee Immobilizer - Left: Discontinue once straight leg raise with < 10 degree lag Restrictions Weight Bearing Restrictions: No Other Position/Activity Restrictions: WBAT      Mobility  Bed Mobility Overal bed mobility: Needs Assistance Bed Mobility: Supine to Sit     Supine to sit: Mod assist     General bed mobility comments: cues for technique and self assist  Transfers Overall transfer level: Needs assistance Equipment used: Rolling walker (2 wheeled) Transfers: Sit to/from Stand Sit to Stand: Min assist;+2 physical assistance;From elevated surface         General transfer comment: cues for hand placment, LLE position, incr time required  Ambulation/Gait Ambulation/Gait assistance: Min assist;+2 safety/equipment;+2 physical assistance Ambulation Distance (Feet): 14 Feet Assistive device: Rolling walker (2 wheeled) Gait Pattern/deviations: Trunk flexed;Step-to pattern;Step-through pattern;Decreased step length - right;Antalgic     General Gait Details: cues for sequence, posture, upward gaze, step length; assist with blance, wt shift, RW  maneuvering  Stairs            Wheelchair Mobility    Modified Rankin (Stroke Patients Only)       Balance Overall balance assessment: Needs assistance Sitting-balance support: Feet supported;Bilateral upper extremity supported Sitting balance-Leahy Scale: Poor Sitting balance - Comments: Needs VCs to lean forward--tendency is for posterior lean Postural control: Posterior lean Standing balance support: Bilateral upper extremity supported Standing balance-Leahy Scale: Poor                               Pertinent Vitals/Pain Pain Assessment: Faces Faces Pain Scale: Hurts a little bit Pain Location: left knee Pain Descriptors / Indicators: Sore Pain Intervention(s): Limited activity within patient's tolerance;Monitored during session;Premedicated before session    Home Living Family/patient expects to be discharged to:: Skilled nursing facility Living Arrangements: Spouse/significant other                    Prior Function Level of Independence: Independent               Hand Dominance        Extremity/Trunk Assessment   Upper Extremity Assessment: Defer to OT evaluation;Overall WFL for tasks assessed           Lower Extremity Assessment: LLE deficits/detail   LLE Deficits / Details: hip flexion and knee extension 2/5 to 2+/5     Communication   Communication: No difficulties  Cognition Arousal/Alertness: Awake/alert Behavior During Therapy: WFL for tasks assessed/performed Overall Cognitive Status: Impaired/Different from baseline Area of Impairment: Following commands       Following Commands: Follows one step commands with increased time  Problem Solving: Decreased initiation;Requires verbal cues;Requires tactile cues;Slow processing General Comments: h/o parkinsons--moves slower    General Comments      Exercises        Assessment/Plan    PT Assessment Patient needs continued PT services  PT Diagnosis  Difficulty walking   PT Problem List Decreased strength;Decreased range of motion;Decreased activity tolerance;Decreased balance;Decreased mobility;Pain  PT Treatment Interventions DME instruction;Gait training;Functional mobility training;Therapeutic activities;Patient/family education;Therapeutic exercise   PT Goals (Current goals can be found in the Care Plan section) Acute Rehab PT Goals Patient Stated Goal: to go to rehab then home PT Goal Formulation: With patient Time For Goal Achievement: 12/08/14 Potential to Achieve Goals: Good    Frequency 7X/week   Barriers to discharge        Co-evaluation PT/OT/SLP Co-Evaluation/Treatment: Yes Reason for Co-Treatment: For patient/therapist safety PT goals addressed during session: Mobility/safety with mobility         End of Session Equipment Utilized During Treatment: Gait belt;Left knee immobilizer Activity Tolerance: Patient tolerated treatment well Patient left: in chair;with call bell/phone within reach;with family/visitor present Nurse Communication: Mobility status         Time: 0930-1002 PT Time Calculation (min) (ACUTE ONLY): 32 min   Charges:   PT Evaluation $Initial PT Evaluation Tier I: 1 Procedure     PT G CodesKenyon Ana 12-21-2014, 11:27 AM

## 2014-12-01 NOTE — Progress Notes (Signed)
Physical Therapy Treatment Patient Details Name: Brian [name] MRN: [id number] DOB: 1940/10/02 Today's Date: 12/01/2014    History of Present Illness s/p L TKA; PMHx: parkinson's, afib    PT Comments    Pt requiring incr assist this afternoon, slower processing time, appears intermittently mildly confused; Pt bleeding from L knee incision--dsg reinforced prior to bed to chair transfer and additional gauze was soaked as well ACE wrap and post op bandage after pt back to bed;  post op dressing taken down as the post op bandage was loose d/t copious amounts of blood, dry gauze/light pressure dressing applied and  New ace wraps--RN notified; pt wife aware also; pt appears slightly diaphoretic after back in bed, HR in 70s and NT to check vitals; Exercises deferred d/t bleeding  Follow Up Recommendations  SNF;Supervision/Assistance - 24 hour     Equipment Recommendations  None recommended by PT    Recommendations for Other Services       Precautions / Restrictions Precautions Precautions: Knee Required Braces or Orthoses: Knee Immobilizer - Left Knee Immobilizer - Left: Discontinue once straight leg raise with < 10 degree lag Restrictions Weight Bearing Restrictions: No Other Position/Activity Restrictions: WBAT    Mobility  Bed Mobility Overal bed mobility: Needs Assistance Bed Mobility: Sit to Supine     Supine to sit: Mod assist Sit to supine: Mod assist;+2 for physical assistance   General bed mobility comments: cues for technique and self assist; +2 for trunk and LEs, incr time  Transfers Overall transfer level: Needs assistance Equipment used: Rolling walker (2 wheeled) Transfers: Sit to/from [facility] Sit to Stand: Max assist;+2 physical assistance Stand pivot transfers: +2 physical assistance;Mod assist;Max assist       General transfer comment: cues for hand placment, LLE position, incr time required  Ambulation/Gait   Stairs            Wheelchair Mobility    Modified Rankin (Stroke Patients Only)       Balance Overall balance assessment: Needs assistance Sitting-balance support: Feet supported;Bilateral upper extremity supported Sitting balance-Leahy Scale: Poor Sitting balance - Comments: Needs VCs to lean forward--tendency is for posterior lean Postural control: Posterior lean Standing balance support: Bilateral upper extremity supported Standing balance-Leahy Scale: Poor                      Cognition Arousal/Alertness: Awake/alert Behavior During Therapy: WFL for tasks assessed/performed Overall Cognitive Status: Impaired/Different from baseline Area of Impairment: Following commands       Following Commands: Follows one step commands with increased time     Problem Solving: Decreased initiation;Requires verbal cues;Requires tactile cues;Slow processing General Comments: h/o parkinsons--moves slower; pt making statements that did not make sense this pm asking to go back to bed and then asking what we were doing, stating  that if we "move table closer to bed so I won't have to go as far"    Exercises      General Comments        Pertinent Vitals/Pain Pain Assessment: 0-10 Pain Score: 2  Faces Pain Scale: Hurts a little bit Pain Location: left knee Pain Descriptors / Indicators: Sore Pain Intervention(s): Limited activity within patient's tolerance;Monitored during session;Ice applied    Home Living Family/patient expects to be discharged to:: Skilled nursing facility Living Arrangements: Spouse/significant other                  Prior Function Level of Independence: Independent  PT Goals (current goals can now be found in the care plan section) Acute Rehab PT Goals Patient Stated Goal: to go to rehab then home PT Goal Formulation: With patient Time For Goal Achievement: 12/08/14 Potential to Achieve Goals: Good Progress towards PT goals: Progressing toward  goals    Frequency  7X/week    PT Plan Current plan remains appropriate    Co-evaluation PT/OT/SLP Co-Evaluation/Treatment: Yes Reason for Co-Treatment: For patient/therapist safety PT goals addressed during session: Mobility/safety with mobility       End of Session Equipment Utilized During Treatment: Gait belt;Left knee immobilizer Activity Tolerance: Patient limited by fatigue Patient left: in bed;with call bell/phone within reach;with nursing/sitter in room;with family/visitor present     Time: 0973-5329 PT Time Calculation (min) (ACUTE ONLY): 29 min  Charges:  $Therapeutic Activity: 23-37 mins                    G Codes:      Denzil Mceachron 2014/12/28, 1:49 PM

## 2014-12-01 NOTE — Progress Notes (Signed)
Utilization review completed.  

## 2014-12-01 NOTE — Clinical Social Work Note (Signed)
Clinical Social Work Assessment  Patient Details  Name: Brian [name] MRN: [id number] Date of Birth: [birth year]  Date of referral:  12/01/14               Reason for consult:  Discharge Planning, Facility Placement                Permission sought to share information with:  Chartered certified accountant granted to share information::  Yes, Verbal Permission Granted  Name::        Agency::     Relationship::     Contact Information:     Housing/Transportation Living arrangements for the past 2 months:  Single Family Home Source of Information:  Patient, Adult Children Patient Interpreter Needed:  None Criminal Activity/Legal Involvement Pertinent to Current Situation/Hospitalization:  No - Comment as needed Significant Relationships:    Lives with:  Spouse Do you feel safe going back to the place where you live?   (ST Rehab needed.) Need for family participation in patient care:  No (Coment)  Care giving concerns: Pt's care cannot be managed at home following hospital d/c.   Social Worker assessment / plan:  Pt hospitalized on 11/30/14 for pre planned left total knee replacement. Pt is part of the medicare bundle program. CSW has met with pt / spouse to assist with d/c planning. PT has recommended ST Rehab placement and pt / spouse are in agreement with this plan. Pt has accepted bed offer from [facility]. CSW will follow to assist with d/c planning to SNF.  Employment status:  Retired Forensic scientist:  Medicare PT Recommendations:  [place] / Referral to community resources:  [place]  Patient/Family's Response to care:  Pt / spouse feel ST rehab is needed.  Patient/Family's Understanding of and Emotional Response to Diagnosis, Current Treatment, and Prognosis:  Pt / spouse are aware of pt's medical status. Pt is motivated to work with therapy.   Emotional Assessment Appearance:  Appears stated  age Attitude/Demeanor/Rapport:  Other (cooperative) Affect (typically observed):  Calm, Pleasant Orientation:  Oriented to Self, Oriented to Place, Oriented to  Time, Oriented to Situation Alcohol / Substance use:  Not Applicable Psych involvement (Current and /or in the community):  No (Comment)  Discharge Needs  Concerns to be addressed:  Discharge Planning Concerns Readmission within the last 30 days:  No Current discharge risk:  None Barriers to Discharge:  No Barriers Identified   Luretha Rued, [place] 12/01/2014, 3:08 PM

## 2014-12-01 NOTE — Evaluation (Signed)
Occupational Therapy Evaluation and Discharge Patient Details Name: Brian [name] MRN: [id number] DOB: [birth year] Today's Date: 12/01/2014    History of Present Illness s/p L TKA; PMHx: parkinson's, afib   Clinical Impression   This 74 yo male admitted and underwent above presents to acute OT with decreased mobility, decreased balance, slowness of movement/coordination due to Parkinsons, decreased bend in LLE, increased pain all affecting his ability to be at home by himself and manage his BADLs while his wife is at work. He will benefit from continued OT at SNF to get to a Mod I level. Acute OT will sign off.    Follow Up Recommendations  SNF    Equipment Recommendations   (TBD next venue)       Precautions / Restrictions Precautions Precautions: Knee Required Braces or Orthoses: Knee Immobilizer - Left Knee Immobilizer - Left: Discontinue once straight leg raise with < 10 degree lag Restrictions Weight Bearing Restrictions: No Other Position/Activity Restrictions: WBAT      Mobility Bed Mobility Overal bed mobility: Needs Assistance Bed Mobility: Supine to Sit     Supine to sit: Mod assist        Transfers Overall transfer level: Needs assistance Equipment used: Rolling walker (2 wheeled) Transfers: Sit to/from Stand Sit to Stand: Min assist;+2 physical assistance;From elevated surface (with increased time)              Balance Overall balance assessment: Needs assistance Sitting-balance support: Bilateral upper extremity supported;Feet supported Sitting balance-Leahy Scale: Poor Sitting balance - Comments: Needs VCs to lean forward--tendency is for posterior lean Postural control: Posterior lean Standing balance support: Bilateral upper extremity supported Standing balance-Leahy Scale: Poor                              ADL Overall ADL's : Needs assistance/impaired Eating/Feeding: Independent;Sitting   Grooming: Set up;Sitting   Upper  Body Bathing: Set up;Sitting   Lower Body Bathing: Maximal assistance (with min A +2 sit<>stand given increased time from raised surface)   Upper Body Dressing : Set up;Sitting   Lower Body Dressing: Total assistance (with min A +2 sit<>stand given increased time from raised surface)   Toilet Transfer: Minimal assistance;+2 for physical assistance;Ambulation;RW (raised bed>walking in hallway>sit in recliner behind him)   Toileting- Water quality scientist and Hygiene: Moderate assistance (with min A +2 sit<>stand given increased time from raised surface)               Vision Additional Comments: Wears glasses all the time per pt          Pertinent Vitals/Pain Pain Assessment: Faces Faces Pain Scale: Hurts a little bit Pain Location: left knee Pain Descriptors / Indicators: Sore Pain Intervention(s): Limited activity within patient's tolerance;Monitored during session;Premedicated before session     Hand Dominance  right   Extremity/Trunk Assessment Upper Extremity Assessment Upper Extremity Assessment: Overall WFL for tasks assessed   Lower Extremity Assessment Lower Extremity Assessment: LLE deficits/detail LLE Deficits / Details: hip flexion and knee extension 2/5 to 2+/5 LLE: Unable to fully assess due to pain       Communication Communication Communication: Sometimes he has trouble getting his thoughts out   Cognition Arousal/Alertness: Awake/alert Behavior During Therapy: WFL for tasks assessed/performed Overall Cognitive Status: Impaired/Different from baseline Area of Impairment: Following commands       Following Commands: Follows one step commands with increased time     Problem Solving: Decreased initiation;Requires verbal  cues;Requires tactile cues;Slow processing General Comments: h/o parkinsons--moves slower              Home Living Family/patient expects to be discharged to:: Skilled nursing facility Living Arrangements: Spouse/significant  other                                      Prior Functioning/Environment Level of Independence: Independent             OT Diagnosis: Generalized weakness;Acute pain (slowness of movements due to parkinsons)   OT Problem List: Decreased strength;Decreased range of motion;Impaired balance (sitting and/or standing);Pain (decreased coordination due to Parkinson's)      OT Goals(Current goals can be found in the care plan section) Acute Rehab OT Goals Patient Stated Goal: to go to rehab then home  OT Frequency:             Co-evaluation PT/OT/SLP Co-Evaluation/Treatment: Yes Reason for Co-Treatment: For patient/therapist safety PT goals addressed during session: Mobility/safety with mobility        End of Session Equipment Utilized During Treatment: Gait belt;Rolling walker  Activity Tolerance: Patient tolerated treatment well Patient left: in chair;with call bell/phone within reach;with chair alarm set;with family/visitor present   Time: 7939-0300 OT Time Calculation (min): 33 min Charges:  OT General Charges $OT Visit: 1 Procedure OT Evaluation $Initial OT Evaluation Tier I: 1 Procedure  Almon Register [phone] 12/01/2014, 11:01 AM

## 2014-12-01 NOTE — Progress Notes (Signed)
[facility] Consult Note   Brian [name]  JIR:[id number]  DOB: 08/06/1940  DOA: 11/30/2014 PCP: Wenda Low, MD  Brief narrative: Brian [name] is a 74 y.o. male with parkinsonism, BPH, hypertension, A. fib on Coumadin who is admitted to the hospital after left total knee arthroplasty. [facility] asked to help manage medical issues. He was noted to go into A. fib with RVR in the PACU.   Subjective: Mild pain in leg. No dyspnea, chest pain or palpitations.   Assessment/Plan: Principal Problem:  Primary osteoarthritis of left knee - Per ortho service  Active Problems:  Atrial fibrillation with RVR -Possibly related to medications given during procedure (received bupivacaine-epinephrine) or stress now resolved -Rate controlled in the 75Z---WCHENIDP Bystolic  - [id number] Score 2 - spoke with his cardiologist Fransico Him- no need to bridge- can simply resume Coumadin which I have done   Parkinsonism - Continue Sinemet at home dose:1 1/2 pills at all 4 dose times: 7 AM, 11 AM, 3 PM and 7 PM for a total of 6 pills daily.  -On Lexapro at bedtime and gabapentin 3 times a day this as well  Essential hypertension -Continue Bystolic -Adjusting other antihypertensives while monitoring BP in the hospital to prevent hypotension  Pedal edema -He takes Lasix for this-no history of heart failure her family and per cardiology notes -Hold Lasix for now and use it when necessary if he develops edema  BPH -resumed Cardura to prevent urinary retention  Urinary urgency -Continue oxybutynin   Restrictive lung disease (Listed in Epic) -Not on home O2   CKD (chronic kidney disease) stage 2, GFR 60-89 ml/min -Follow   Code Status:     Code Status Orders        Start     Ordered   11/30/14 1313  Full code   Continuous     11/30/14 1312    Advance Directive Documentation        Most Recent Value   Type of Advance Directive  Healthcare Power of Attorney,  Living will   Pre-existing out of facility DNR order (yellow form or pink MOST form)     "MOST" Form in Place?       Antibiotics: Anti-infectives    Start     Dose/Rate Route Frequency Ordered Stop   11/30/14 1800  vancomycin (VANCOCIN) 1,500 mg in sodium chloride 0.9 % 500 mL IVPB     1,500 mg 250 mL/hr over 120 Minutes Intravenous Every 12 hours 11/30/14 1312 11/30/14 2014   11/30/14 0848  polymyxin B 500,000 Units, bacitracin 50,000 Units in sodium chloride irrigation 0.9 % 500 mL irrigation  Status:  Discontinued       As needed 11/30/14 0848 11/30/14 1027   11/30/14 0600  vancomycin (VANCOCIN) 1,500 mg in sodium chloride 0.9 % 500 mL IVPB     1,500 mg 250 mL/hr over 120 Minutes Intravenous On call to O.R. 11/29/14 1349 11/30/14 0836      Objective: Filed Weights   11/30/14 0641  Weight: 112.492 kg (248 lb)    Intake/Output Summary (Last 24 hours) at 12/01/14 1504 Last data filed at 12/01/14 1401  Gross per 24 hour  Intake 1604.5 ml  Output   4225 ml  Net -2620.5 ml     Vitals Filed Vitals:   12/01/14 0210 12/01/14 0522 12/01/14 1051 12/01/14 1425  BP: 125/66 139/67 111/66 125/64  Pulse: 97 77 78 69  Temp: 98.3 F (36.8 C) 97.7 F (36.5 C) 98 F (36.7  C) 98.7 F (37.1 C)  TempSrc: Oral Oral Oral Oral  Resp: 20 20 18 20   Height:      Weight:      SpO2: 97% 98% 98% 97%    Exam:  General:  Pt is alert, not in acute distress  HEENT: No icterus, No thrush, oral mucosa moist  Cardiovascular: regular rate and rhythm, S1/S2 No murmur  Respiratory: clear to auscultation bilaterally   Abdomen: Soft, +Bowel sounds, non tender, non distended, no guarding  MSK: No LE edema, cyanosis or clubbing  Data Reviewed: Basic Metabolic Panel:  Recent Labs Lab 12/01/14 0525  NA 136  K 3.8  CL 103  CO2 25  GLUCOSE 138*  BUN 11  CREATININE 0.90  CALCIUM 9.2   Liver Function Tests: No results for input(s): AST, ALT, ALKPHOS, BILITOT, PROT, ALBUMIN in the last  168 hours. No results for input(s): LIPASE, AMYLASE in the last 168 hours. No results for input(s): AMMONIA in the last 168 hours. CBC:  Recent Labs Lab 12/01/14 0525  WBC 8.3  HGB 12.8*  HCT 36.6*  MCV 91.7  PLT 171   Cardiac Enzymes: No results for input(s): CKTOTAL, CKMB, CKMBINDEX, TROPONINI in the last 168 hours. BNP (last 3 results) No results for input(s): BNP in the last 8760 hours.  ProBNP (last 3 results) No results for input(s): PROBNP in the last 8760 hours.  CBG: No results for input(s): GLUCAP in the last 168 hours.  Recent Results (from the past 240 hour(s))  Surgical pcr screen     Status: None   Collection Time: 11/23/14  2:55 PM  Result Value Ref Range Status   MRSA, PCR NEGATIVE NEGATIVE Final   Staphylococcus aureus NEGATIVE NEGATIVE Final    Comment:        The Xpert SA Assay (FDA approved for NASAL specimens in patients over 36 years of age), is one component of a comprehensive surveillance program.  Test performance has been validated by [facility] for patients greater than or equal to 44 year old. It is not intended to diagnose infection nor to guide or monitor treatment.      Studies: Dg Knee Left Port  11/30/2014   CLINICAL DATA:  Postoperative left knee radiograph, post knee arthroplasty.  EXAM: PORTABLE LEFT KNEE - 1-2 VIEW  COMPARISON:  11/23/2014  FINDINGS: There has been interval total knee arthroplasty. The prosthetic components are well seated. No evidence of immediate complications. There are expected postsurgical changes such is soft tissue swelling and subcutaneous emphysema. Gas within the suprapatellar joint space is also seen. Skin staples are noted.  IMPRESSION: Status post total left knee arthroplasty. No evidence of immediate complications.   Electronically Signed   By: Fidela Salisbury M.D.   On: 11/30/2014 11:42    Scheduled Meds:  Scheduled Meds: . acidophilus  1 capsule Oral Daily  . carbidopa-levodopa  1 tablet  Oral QHS  . carbidopa-levodopa  1.5 tablet Oral 4 times per day  . clonazePAM  0.5 mg Oral QHS  . doxazosin  2 mg Oral TID AC  . enoxaparin (LOVENOX) injection  30 mg Subcutaneous Q24H  . escitalopram  5 mg Oral QHS  . famotidine  40 mg Oral Daily  . gabapentin  100 mg Oral TID  . nebivolol  10 mg Oral Daily  . oxybutynin  5 mg Oral Daily  . warfarin  5 mg Oral ONCE-1800  . Warfarin - Pharmacist Dosing Inpatient   Does not apply [phone]  Continuous Infusions:   Time spent on care of this patient: 30 min   [place], MD 12/01/2014, 3:04 PM  LOS: 1 day   [facility] Office  [phone] Pager - Text Page per [email or web address] If 7PM-7AM, please contact night-coverage [email or web address]

## 2014-12-01 NOTE — Op Note (Signed)
NAMEHERMILO, DUTTER NO.:  [id number]  MEDICAL RECORD NO.:  [id number]  LOCATION:  3762                         FACILITY:  [facility] ([facility]  PHYSICIAN:  Susa Day, M.D.    DATE OF BIRTH:  1940-08-24  DATE OF PROCEDURE:  11/30/2014 DATE OF DISCHARGE:                              OPERATIVE REPORT   PREOPERATIVE DIAGNOSIS:  Degenerative joint disease and end-stage osteoarthrosis, left knee.  POSTOPERATIVE DIAGNOSIS:  Degenerative joint disease and end-stage osteoarthrosis, left knee.  PROCEDURE PERFORMED:  Left total knee arthroplasty.  COMPONENTS:  DePuy rotating platform, 4 femur, 4 tibia, 10 mm insert, 41 patella.  ANESTHESIA:  General.  ASSISTANT:  Cleophas Dunker, PA.  HISTORY:  A 74 year old, bone-on-bone arthrosis of lateral compartment, Parkinson disease, refractory to conservative treatment, indicated for replacement of degenerated joint due to significant detriment to his activities of daily living.  Risk and benefits were discussed including bleeding, infection, damage to neurovascular structures, DVT, PE, anesthetic complications, suboptimal range of motion, etc.  TECHNIQUE:  With the patient supine position, after induction of adequate general anesthesia, 1 g vanco and 2 g of Kefzol, the left lower extremity was prepped and draped in the usual sterile fashion.  After exsanguination, thigh tourniquet, 300 mmHg, midline incision was made over the knee.  Full-thickness flaps developed.  Median parapatellar arthrotomy performed.  Patella was everted.  Knee flexed. Tricompartmental osteoarthrosis was noted, particularly lateral compartment, femoral condyle, and patella.  Osteophytes removed with a rongeur.  Remnants of medial and lateral menisci were removed.  We slightly elevated soft tissues medially preserving the MCL.  Step drill was utilized to enter the femoral canal, it was irrigated, 5-degree left was placed, 11 off the distal femur.  Due to  the slight flexion contracture, performed distal femoral cut and then we sized off the anterior cortex to a 4, this was then pinned at 3 degrees of external rotation.  Anterior, posterior, and chamfer cuts were performed.  We did not notch the cortex.  Attention was turned towards the tibia, it was subluxed with retractors.  Remnants of the medial and lateral menisci removed, external alignment guide, slight slope, parallel to the shaft, bisecting tibiotalar joint, 2 off the defect, which was actually posteromedially, which was then pinned.  We performed our oscillating saw protecting the soft tissues at all times.  We checked our flexion- extension gaps, they were equivalent.  Next, we turned our attention towards completing the tibia.  Again, it was subluxed.  Retractors placed, measured to a 4, maximizing the coverage just to the medial aspect of the tibial tubercle, it was pinned, centrally drilled, punch guide utilized and turned our attention back towards the femur.  Box cut was used and a fairly large medial femoral condyle bisected the notch parallel to the lateral shaft of the femur.  Pinned it and performed our box cut, placed a trial femur and tibia and a 10 mm insert.  We actually had full extension, full flexion, good stability, and mid flexion, then everted the patella, removed osteophytes with a rongeur, was fairly eburnated.  We planed it to 15 mm of thickness.  This was an oscillating saw, measured the  remaining, which was 15, sized it to a 41, drilled our PEG holes medializing them, placed a trial patella and we had excellent patellofemoral tracking.  All the trials were removed.  Knee was flexed, tibia subluxed, and all surface was thoroughly dried.  We mixed the cement on the back table in appropriate fashion, injecting cement into the tibial canal and digitally pressurizing the cement.  We impacted the tibial tray, redundant cement removed.  We cemented and impacted  the femoral component, redundant cement removed.  We cemented and clamped the patella.  Copiously irrigated with antibiotic irrigation.  Allowed for the cement to fully cure and then removed redundant cement.  We trailed and there was excellent tracking and stability.  Full extension, full flexion.  We therefore selected the 10, removed the 10, removed any noted redundant cement.  Copiously irrigated the wound with antibiotic irrigation.  Placed a 10 permanent insert, reduced it, full extension, full flexion, and excellent patellofemoral tracking.  Bone wax placed on an exposed cancellous surface.  Tourniquet was deflated.  We had injected 0.25% Marcaine with epinephrine during the periarticular tissues.  There was minimal bleeding.  We therefore decided not to use a drain.  Cauterized any minor bleeding.  Copiously irrigated once again. Repaired and reapproximated the patellar arthrotomy with 1 Vicryl and then oversewed with running V-Loc.  Copious irrigation and subcu with 2- 0, and skin with staples.  He had good flexion, good extension, flexion to gravity was 90 degrees.  Excellent patellofemoral tracking. Excellent stability.  Negative anterior drawer.  Sterile dressing was applied.  He was placed in immobilizer, placed in a knee sleeve, extubated without difficulty, and transported to the recovery room in satisfactory condition.  The patient tolerated the procedure well.  No complications.  Minimal blood loss.  Tourniquet time was 86 minutes.     Susa Day, M.D.     Geralynn Rile  D:  11/30/2014  T:  12/01/2014  Job:  [id number]

## 2014-12-02 LAB — PROTIME-INR
INR: 1.63 — AB (ref 0.00–1.49)
Prothrombin Time: 19.3 seconds — ABNORMAL HIGH (ref 11.6–15.2)

## 2014-12-02 LAB — CBC
HEMATOCRIT: 38 % — AB (ref 39.0–52.0)
HEMOGLOBIN: 13 g/dL (ref 13.0–17.0)
MCH: 31.7 pg (ref 26.0–34.0)
MCHC: 34.2 g/dL (ref 30.0–36.0)
MCV: 92.7 fL (ref 78.0–100.0)
Platelets: 157 10*3/uL (ref 150–400)
RBC: 4.1 MIL/uL — ABNORMAL LOW (ref 4.22–5.81)
RDW: 13.4 % (ref 11.5–15.5)
WBC: 8.4 10*3/uL (ref 4.0–10.5)

## 2014-12-02 MED ORDER — WARFARIN SODIUM 5 MG PO TABS
5.0000 mg | ORAL_TABLET | Freq: Once | ORAL | Status: AC
  Administered 2014-12-02: 5 mg via ORAL
  Filled 2014-12-02: qty 1

## 2014-12-02 MED ORDER — HYDROCODONE-ACETAMINOPHEN 5-325 MG PO TABS: 1.0000 | ORAL_TABLET | ORAL | Status: DC | PRN

## 2014-12-02 MED ORDER — TRAMADOL HCL 50 MG PO TABS: 50.0000 mg | ORAL_TABLET | Freq: Four times a day (QID) | ORAL | Status: DC | PRN

## 2014-12-02 NOTE — Progress Notes (Signed)
Physical Therapy Treatment Patient Details Name: Brian [name] MRN: [id number] DOB: 1940-08-05 Today's Date: 12/02/2014    History of Present Illness s/p L TKA; PMHx: parkinson's, afib    PT Comments    Pt progressing slowly, sleepy today;   Follow Up Recommendations  SNF;Supervision/Assistance - 24 hour     Equipment Recommendations  None recommended by PT    Recommendations for Other Services       Precautions / Restrictions Precautions Precautions: Knee Required Braces or Orthoses: Knee Immobilizer - Left Knee Immobilizer - Left: Discontinue once straight leg raise with < 10 degree lag Restrictions Other Position/Activity Restrictions: WBAT    Mobility  Bed Mobility Overal bed mobility: Needs Assistance Bed Mobility: Sit to Supine     Supine to sit: +2 for physical assistance;Max assist Sit to supine: +2 for physical assistance;Max assist   General bed mobility comments: cues for technique and self assist; +2 for trunk and LEs, incr time  Transfers Overall transfer level: Needs assistance Equipment used: Rolling walker (2 wheeled) Transfers: Sit to/from Stand Sit to Stand: Total assist;+2 physical assistance;From elevated surface         General transfer comment: pt unable to come to stand with +2 assist d/t R knee pain (his "good leg"); completed scooting along EOB for better positioning with +2 total assist, using bed pad   Ambulation/Gait                 Stairs            Wheelchair Mobility    Modified Rankin (Stroke Patients Only)       Balance   Sitting-balance support: Feet supported;Bilateral upper extremity supported Sitting balance-Leahy Scale: Fair                              Cognition Arousal/Alertness: Awake/alert Behavior During Therapy: WFL for tasks assessed/performed;Flat affect Overall Cognitive Status: Impaired/Different from baseline Area of Impairment: Following commands       Following  Commands: Follows one step commands with increased time     Problem Solving: Decreased initiation;Requires verbal cues;Requires tactile cues;Slow processing General Comments: h/o parkinsons-slightly lethargic but arousable    Exercises Total Joint Exercises Ankle Circles/Pumps: AAROM;Both;5 reps Quad Sets: Left;5 reps;Strengthening Heel Slides: AAROM;Both;5 reps    General Comments        Pertinent Vitals/Pain Pain Assessment: Faces Faces Pain Scale: Hurts little more Pain Location: bil knees Pain Descriptors / Indicators: Discomfort;Grimacing;Guarding Pain Intervention(s): Monitored during session;Limited activity within patient's tolerance;Premedicated before session;Repositioned;Ice applied    Home Living                      Prior Function            PT Goals (current goals can now be found in the care plan section) Acute Rehab PT Goals Patient Stated Goal: to go to rehab then home PT Goal Formulation: With patient Time For Goal Achievement: 12/08/14 Potential to Achieve Goals: Good Progress towards PT goals: Not progressing toward goals - comment    Frequency  7X/week    PT Plan Current plan remains appropriate    Co-evaluation             End of Session Equipment Utilized During Treatment: Gait belt;Left knee immobilizer Activity Tolerance: Patient limited by fatigue;Patient limited by pain Patient left: in bed;with call bell/phone within reach;with family/visitor present     Time: 2355-7322  PT Time Calculation (min) (ACUTE ONLY): 31 min  Charges:  $Therapeutic Activity: 8-22 mins                    G Codes:      Wylie Coon 2014-12-17, 5:35 PM

## 2014-12-02 NOTE — Progress Notes (Signed)
   Subjective: 2 Days Post-Op Procedure(s) (LRB): TOTAL KNEE ARTHROPLASTY (Left) Patient reports pain as mild.   Patient seen in rounds with Dr. Wynelle Link.  Wife in room.  Patient quite sedated. Will change the medications. Patient is well, but has had some minor complaints of pain in the knee, requiring pain medications Plan is to go Home after hospital stay.  Objective: Vital signs in last 24 hours: Temp:  [98 F (36.7 C)-98.7 F (37.1 C)] 98.4 F (36.9 C) (09/10 0645) Pulse Rate:  [69-78] 77 (09/10 0645) Resp:  [18-20] 20 (09/10 0645) BP: (111-125)/(64-66) 113/66 mmHg (09/10 0645) SpO2:  [97 %-98 %] 98 % (09/10 0645)  Intake/Output from previous day:  Intake/Output Summary (Last 24 hours) at 12/02/14 0738 Last data filed at 12/01/14 1850  Gross per 24 hour  Intake    960 ml  Output   1925 ml  Net   -965 ml     Labs:  Recent Labs  12/01/14 0525 12/02/14 0605  HGB 12.8* 13.0    Recent Labs  12/01/14 0525 12/02/14 0605  WBC 8.3 8.4  RBC 3.99* 4.10*  HCT 36.6* 38.0*  PLT 171 157    Recent Labs  12/01/14 0525  NA 136  K 3.8  CL 103  CO2 25  BUN 11  CREATININE 0.90  GLUCOSE 138*  CALCIUM 9.2    Recent Labs  12/01/14 0525 12/02/14 0605  INR 1.41 1.63*    EXAM General - Patient is Appropriate and but sedated Extremity - Neurovascular intact Sensation intact distally Dressing/Incision - clean, dry, no drainage Motor Function - intact, moving foot and toes well on exam.   Past Medical History  Diagnosis Date  . Dyslipidemia   . Mitral regurgitation     Mild  . Abnormal PFT 1. 05/18/08  2. 11/30/08    1. Showed mild airflow obstruction, mild restriction, mild diffusion defect; FEV1 2.22(64%), FVC 3.33(65%), FEVi% 67, TLC 5.19(69%), DLCO 77%, +BD  2. FEV1 2.38(73%), FVC 3.81(80%), FEV1% 63, TLC 5.61(80%), DLCO 79%, no BD  . Parkinsonism 06/22/2012  . Paresthesias 06/22/2012  . Hyperlipidemia   . GERD (gastroesophageal reflux disease)   . Erectile  dysfunction   . BPH (benign prostatic hyperplasia)   . Restrictive lung disease   . CKD (chronic kidney disease) stage 2, GFR 60-89 ml/min   . Cellulitis     right leg MRSA  . Osteoarthritis   . Allergic rhinitis   . Anxiety   . Hypertension   . Atrial fibrillation     persistent afib  . Heart murmur     as a teenager     Assessment/Plan: 2 Days Post-Op Procedure(s) (LRB): TOTAL KNEE ARTHROPLASTY (Left) Principal Problem:   Primary osteoarthritis of left knee Active Problems:   Essential hypertension   Atrial fibrillation   Parkinsonism   Restrictive lung disease   CKD (chronic kidney disease) stage 2, GFR 60-89 ml/min  Estimated body mass index is 31.83 kg/(m^2) as calculated from the following:   Height as of this encounter: 6\' 2"  (1.88 m).   Weight as of this encounter: 112.492 kg (248 lb). Up with therapy  DVT Prophylaxis - Lovenox and Coumadin Weight-Bearing as tolerated to left leg  Arlee Muslim, PA-C Orthopaedic Surgery 12/02/2014, 7:38 AM

## 2014-12-02 NOTE — Progress Notes (Signed)
[facility] Consult Note   AIMEE TIMMONS  IWL:[id number]  DOB: 18-Sep-1940  DOA: 11/30/2014 PCP: Wenda Low, MD  Brief narrative: Brian [name] is a 74 y.o. male with parkinsonism, BPH, hypertension, A. fib on Coumadin who is admitted to the hospital after left total knee arthroplasty. [facility] asked to help manage medical issues. He was noted to go into A. fib with RVR in the PACU.   Subjective: No complaints today. Quite sleepy.   Assessment/Plan: Principal Problem:  Primary osteoarthritis of left knee - Per ortho service  Active Problems:  Atrial fibrillation with RVR -Possibly related to medications given during procedure (received bupivacaine-epinephrine) or stress now resolved -Rate controlled in the 17E---YCXKGYJE Bystolic  - [id number] Score 2 - spoke with his cardiologist Fransico Him- no need to bridge- can simply resume Coumadin which I have done   Parkinsons disease  - Continue Sinemet at home dose:1 1/2 pills at all 4 dose times: 7 AM, 11 AM, 3 PM and 7 PM for a total of 6 pills daily.  -On Lexapro at bedtime and gabapentin 3 times a day this as well  Essential hypertension -Continue Bystolic and Doxazosin - holding other antihypertensives while monitoring BP in the hospital to prevent hypotension  Pedal edema -He takes Lasix for this-no history of heart failure her family and per cardiology notes -Hold Lasix for now and use it when necessary if he develops edema  BPH -resumed Cardura to prevent urinary retention  Urinary urgency -Continue oxybutynin   Restrictive lung disease (Listed in Epic) -Not on home O2   CKD (chronic kidney disease) stage 2, GFR 60-89 ml/min -Follow- stable   Code Status:     Code Status Orders        Start     Ordered   11/30/14 1313  Full code   Continuous     11/30/14 1312    Advance Directive Documentation        Most Recent Value   Type of Advance Directive  Healthcare Power of  Attorney, Living will   Pre-existing out of facility DNR order (yellow form or pink MOST form)     "MOST" Form in Place?       Antibiotics: Anti-infectives    Start     Dose/Rate Route Frequency Ordered Stop   11/30/14 1800  vancomycin (VANCOCIN) 1,500 mg in sodium chloride 0.9 % 500 mL IVPB     1,500 mg 250 mL/hr over 120 Minutes Intravenous Every 12 hours 11/30/14 1312 11/30/14 2014   11/30/14 0848  polymyxin B 500,000 Units, bacitracin 50,000 Units in sodium chloride irrigation 0.9 % 500 mL irrigation  Status:  Discontinued       As needed 11/30/14 0848 11/30/14 1027   11/30/14 0600  vancomycin (VANCOCIN) 1,500 mg in sodium chloride 0.9 % 500 mL IVPB     1,500 mg 250 mL/hr over 120 Minutes Intravenous On call to O.R. 11/29/14 1349 11/30/14 0836      Objective: Filed Weights   11/30/14 0641  Weight: 112.492 kg (248 lb)    Intake/Output Summary (Last 24 hours) at 12/02/14 1405 Last data filed at 12/02/14 1100  Gross per 24 hour  Intake    600 ml  Output    500 ml  Net    100 ml     Vitals Filed Vitals:   12/01/14 0522 12/01/14 1051 12/01/14 1425 12/02/14 0645  BP: 139/67 111/66 125/64 113/66  Pulse: 77 78 69 77  Temp: 97.7 F (36.5  C) 98 F (36.7 C) 98.7 F (37.1 C) 98.4 F (36.9 C)  TempSrc: Oral Oral Oral Oral  Resp: 20 18 20 20   Height:      Weight:      SpO2: 98% 98% 97% 98%    Exam:  General:  Pt is alert, not in acute distress  HEENT: No icterus, No thrush, oral mucosa moist  Cardiovascular: regular rate and rhythm, S1/S2 No murmur  Respiratory: clear to auscultation bilaterally   Abdomen: Soft, +Bowel sounds, non tender, non distended, no guarding  MSK: No LE edema, cyanosis or clubbing  Data Reviewed: Basic Metabolic Panel:  Recent Labs Lab 12/01/14 0525  NA 136  K 3.8  CL 103  CO2 25  GLUCOSE 138*  BUN 11  CREATININE 0.90  CALCIUM 9.2   Liver Function Tests: No results for input(s): AST, ALT, ALKPHOS, BILITOT, PROT, ALBUMIN in  the last 168 hours. No results for input(s): LIPASE, AMYLASE in the last 168 hours. No results for input(s): AMMONIA in the last 168 hours. CBC:  Recent Labs Lab 12/01/14 0525 12/02/14 0605  WBC 8.3 8.4  HGB 12.8* 13.0  HCT 36.6* 38.0*  MCV 91.7 92.7  PLT 171 157   Cardiac Enzymes: No results for input(s): CKTOTAL, CKMB, CKMBINDEX, TROPONINI in the last 168 hours. BNP (last 3 results) No results for input(s): BNP in the last 8760 hours.  ProBNP (last 3 results) No results for input(s): PROBNP in the last 8760 hours.  CBG: No results for input(s): GLUCAP in the last 168 hours.  Recent Results (from the past 240 hour(s))  Surgical pcr screen     Status: None   Collection Time: 11/23/14  2:55 PM  Result Value Ref Range Status   MRSA, PCR NEGATIVE NEGATIVE Final   Staphylococcus aureus NEGATIVE NEGATIVE Final    Comment:        The Xpert SA Assay (FDA approved for NASAL specimens in patients over 14 years of age), is one component of a comprehensive surveillance program.  Test performance has been validated by [facility] for patients greater than or equal to 69 year old. It is not intended to diagnose infection nor to guide or monitor treatment.      Studies: No results found.  Scheduled Meds:  Scheduled Meds: . acidophilus  1 capsule Oral Daily  . carbidopa-levodopa  1 tablet Oral QHS  . carbidopa-levodopa  1.5 tablet Oral 4 times per day  . clonazePAM  0.5 mg Oral QHS  . doxazosin  2 mg Oral TID AC  . enoxaparin (LOVENOX) injection  30 mg Subcutaneous Q24H  . escitalopram  5 mg Oral QHS  . famotidine  40 mg Oral Daily  . gabapentin  100 mg Oral TID  . nebivolol  10 mg Oral Daily  . oxybutynin  5 mg Oral Daily  . warfarin  5 mg Oral ONCE-1800  . Warfarin - Pharmacist Dosing Inpatient   Does not apply q1800   Continuous Infusions:   Time spent on care of this patient: 30 min   [place], MD 12/02/2014, 2:05 PM  LOS: 2 days   [facility] Office  [phone] Pager - Text Page per [email or web address] If 7PM-7AM, please contact night-coverage [email or web address]

## 2014-12-02 NOTE — Progress Notes (Signed)
[place] for Coumadin Indication: VTE prophylaxis post-op; Afib  Allergies  Allergen Reactions  . Lisinopril Other (See Comments)    coughing    Patient Measurements: Height: 6\' 2"  (188 cm) Weight: 248 lb (112.492 kg) IBW/kg (Calculated) : 82.2  Vital Signs: Temp: 98.4 F (36.9 C) (09/10 0645) Temp Source: Oral (09/10 0645) BP: 113/66 mmHg (09/10 0645) Pulse Rate: 77 (09/10 0645)  Labs:  Recent Labs  11/30/14 0624 12/01/14 0525 12/02/14 0605  HGB  --  12.8* 13.0  HCT  --  36.6* 38.0*  PLT  --  171 157  LABPROT 16.3* 17.3* 19.3*  INR 1.30 1.41 1.63*  CREATININE  --  0.90  --     Estimated Creatinine Clearance: 99 mL/min (by C-G formula based on Cr of 0.9).   Medical History: Past Medical History  Diagnosis Date  . Dyslipidemia   . Mitral regurgitation     Mild  . Abnormal PFT 1. 05/18/08  2. 11/30/08    1. Showed mild airflow obstruction, mild restriction, mild diffusion defect; FEV1 2.22(64%), FVC 3.33(65%), FEVi% 67, TLC 5.19(69%), DLCO 77%, +BD  2. FEV1 2.38(73%), FVC 3.81(80%), FEV1% 63, TLC 5.61(80%), DLCO 79%, no BD  . Parkinsonism 06/22/2012  . Paresthesias 06/22/2012  . Hyperlipidemia   . GERD (gastroesophageal reflux disease)   . Erectile dysfunction   . BPH (benign prostatic hyperplasia)   . Restrictive lung disease   . CKD (chronic kidney disease) stage 2, GFR 60-89 ml/min   . Cellulitis     right leg MRSA  . Osteoarthritis   . Allergic rhinitis   . Anxiety   . Hypertension   . Atrial fibrillation     persistent afib  . Heart murmur     as a teenager     Medications:  Prescriptions prior to admission  Medication Sig Dispense Refill Last Dose  . amLODipine (NORVASC) 10 MG tablet TAKE ONE TABLET BY MOUTH ONCE DAILY. 30 tablet 0 11/30/2014 at 0445  . BENICAR 40 MG tablet TAKE ONE TABLET BY MOUTH ONCE DAILY 30 tablet 4 11/29/2014 at Unknown time  . BYSTOLIC 10 MG tablet Take 10 mg by mouth daily.    11/30/2014 at  0445  . carbidopa-levodopa (SINEMET CR) 50-200 MG per tablet Take 1 tablet by mouth at bedtime. 90 tablet 3 11/29/2014 at 2000  . carbidopa-levodopa (SINEMET IR) 25-100 MG per tablet Take 1.5 tablets by mouth 4 (four) times daily. 7 am, 11 am, 3 pm and 7 pm 540 tablet 3 11/30/2014 at 0445  . Cholecalciferol (VITAMIN D3) 1000 UNITS CAPS Take 1,000 Units by mouth daily.    11/29/2014 at Unknown time  . clonazePAM (KLONOPIN) 0.5 MG disintegrating tablet Take 1 tablet (0.5 mg total) by mouth at bedtime. 90 tablet 3 11/29/2014 at 2000  . doxazosin (CARDURA) 2 MG tablet TAKE THREE TABLETS BY MOUTH ONCE DAILY. (Patient taking differently: 1 tablet TID) 90 tablet 0 11/30/2014 at 0445  . escitalopram (LEXAPRO) 5 MG tablet Take 1 tablet (5 mg total) by mouth daily. 90 tablet 3 11/30/2014 at 0445  . famotidine (PEPCID) 40 MG tablet Take 40 mg by mouth daily.    11/30/2014 at 0445  . furosemide (LASIX) 20 MG tablet Take 20 mg by mouth 2 (two) times daily.    11/29/2014 at Unknown time  . gabapentin (NEURONTIN) 100 MG capsule Take 1 capsule (100 mg total) by mouth 3 (three) times daily. 270 capsule 3 11/29/2014 at Unknown time  .  ketoconazole (NIZORAL) 2 % cream Apply 2 application topically 2 (two) times a week.   Taking  . KLOR-CON M10 10 MEQ tablet Take 1 tablet by mouth daily.   11/29/2014 at Unknown time  . Multiple Vitamins-Minerals (CENTRUM SILVER PO) Take 1 tablet by mouth daily.    11/29/2014 at Unknown time  . oxybutynin (DITROPAN) 5 MG tablet Take 5 mg by mouth daily.    11/29/2014 at Unknown time  . pravastatin (PRAVACHOL) 40 MG tablet Take 40 mg by mouth daily.    11/29/2014 at Unknown time  . warfarin (COUMADIN) 5 MG tablet Take as directed by  Coumadin clinic (Patient taking differently: Take 5 mg by mouth daily. Take as directed by  Coumadin clinic) 135 tablet 1 11/24/2014    Assessment: 74 yo M admitted for L TKA.  He is on chronic Coumadin for Afib.  INR is at baseline after being held x 5 days pre-op (last dose  11/24/14).  Per Coumadin clinic records he has been therapeutic on 5mg  daily for >6 months.   12/02/2014:   INR sub-therapeutic, but rising to goal   No major drug interactions  CBC stable  No bleeding documented  Diet: regular diet   Goal of Therapy:  INR 2-3 Monitor platelets by anticoagulation protocol: Yes   Plan:  - Coumadin 5mg  po x1 today - Daily INR - Lovenox 30mg  sq daily until INR >1.8 (per MD)  Devin Going, Wandalee Klang P 12/02/2014,1:24 PM

## 2014-12-02 NOTE — Plan of Care (Signed)
Problem: Consults Goal: Diagnosis- Total Joint Replacement Outcome: Completed/Met Date Met:  12/02/14 Primary Total Knee LEFT     

## 2014-12-02 NOTE — Progress Notes (Signed)
   Subjective: 2 Days Post-Op Procedure(s) (LRB): TOTAL KNEE ARTHROPLASTY (Left) Patient reports pain as mild.  Very sedate this AM. Wife feels like the oxycodone is too strong and sedates him too much. Plan is to go Skilled nursing facility after hospital stay.  Objective: Vital signs in last 24 hours: Temp:  [98 F (36.7 C)-98.7 F (37.1 C)] 98.4 F (36.9 C) (09/10 0645) Pulse Rate:  [69-78] 77 (09/10 0645) Resp:  [18-20] 20 (09/10 0645) BP: (111-125)/(64-66) 113/66 mmHg (09/10 0645) SpO2:  [97 %-98 %] 98 % (09/10 0645)  Intake/Output from previous day:  Intake/Output Summary (Last 24 hours) at 12/02/14 0805 Last data filed at 12/01/14 1850  Gross per 24 hour  Intake    960 ml  Output   1925 ml  Net   -965 ml    Intake/Output this shift:    Labs:  Recent Labs  12/01/14 0525 12/02/14 0605  HGB 12.8* 13.0    Recent Labs  12/01/14 0525 12/02/14 0605  WBC 8.3 8.4  RBC 3.99* 4.10*  HCT 36.6* 38.0*  PLT 171 157    Recent Labs  12/01/14 0525  NA 136  K 3.8  CL 103  CO2 25  BUN 11  CREATININE 0.90  GLUCOSE 138*  CALCIUM 9.2    Recent Labs  12/01/14 0525 12/02/14 0605  INR 1.41 1.63*    EXAM General - Patient is Appropriate, Oriented and alert but very sedated Extremity - Neurovascular intact Incision: dressing C/D/I No cellulitis present Compartment soft Dressing/Incision - clean, dry, no drainage Motor Function - intact, moving foot and toes well on exam.   Past Medical History  Diagnosis Date  . Dyslipidemia   . Mitral regurgitation     Mild  . Abnormal PFT 1. 05/18/08  2. 11/30/08    1. Showed mild airflow obstruction, mild restriction, mild diffusion defect; FEV1 2.22(64%), FVC 3.33(65%), FEVi% 67, TLC 5.19(69%), DLCO 77%, +BD  2. FEV1 2.38(73%), FVC 3.81(80%), FEV1% 63, TLC 5.61(80%), DLCO 79%, no BD  . Parkinsonism 06/22/2012  . Paresthesias 06/22/2012  . Hyperlipidemia   . GERD (gastroesophageal reflux disease)   . Erectile  dysfunction   . BPH (benign prostatic hyperplasia)   . Restrictive lung disease   . CKD (chronic kidney disease) stage 2, GFR 60-89 ml/min   . Cellulitis     right leg MRSA  . Osteoarthritis   . Allergic rhinitis   . Anxiety   . Hypertension   . Atrial fibrillation     persistent afib  . Heart murmur     as a teenager     Assessment/Plan: 2 Days Post-Op Procedure(s) (LRB): TOTAL KNEE ARTHROPLASTY (Left) Principal Problem:   Primary osteoarthritis of left knee Active Problems:   Essential hypertension   Atrial fibrillation   Parkinsonism   Restrictive lung disease   CKD (chronic kidney disease) stage 2, GFR 60-89 ml/min   Up with therapy Plan for discharge tomorrow  Oxycodone discontinued. Use tramadol for pain with hydrocodone as backup Weight-Bearing as tolerated to left leg Hold on discharge today as he is too sedated to effectively participate in rehab. Will see if he clears with the tramadol and probably discharge tomorrow if possible  Treven Holtman V 12/02/2014, 8:05 AM

## 2014-12-03 DIAGNOSIS — K219 Gastro-esophageal reflux disease without esophagitis: Secondary | ICD-10-CM | POA: Diagnosis not present

## 2014-12-03 DIAGNOSIS — R609 Edema, unspecified: Secondary | ICD-10-CM | POA: Diagnosis not present

## 2014-12-03 DIAGNOSIS — I129 Hypertensive chronic kidney disease with stage 1 through stage 4 chronic kidney disease, or unspecified chronic kidney disease: Secondary | ICD-10-CM | POA: Diagnosis not present

## 2014-12-03 DIAGNOSIS — I481 Persistent atrial fibrillation: Secondary | ICD-10-CM | POA: Diagnosis not present

## 2014-12-03 DIAGNOSIS — I480 Paroxysmal atrial fibrillation: Secondary | ICD-10-CM | POA: Diagnosis not present

## 2014-12-03 DIAGNOSIS — Z833 Family history of diabetes mellitus: Secondary | ICD-10-CM | POA: Diagnosis not present

## 2014-12-03 DIAGNOSIS — Z96652 Presence of left artificial knee joint: Secondary | ICD-10-CM | POA: Diagnosis not present

## 2014-12-03 DIAGNOSIS — D62 Acute posthemorrhagic anemia: Secondary | ICD-10-CM | POA: Diagnosis not present

## 2014-12-03 DIAGNOSIS — Z8249 Family history of ischemic heart disease and other diseases of the circulatory system: Secondary | ICD-10-CM | POA: Diagnosis not present

## 2014-12-03 DIAGNOSIS — Z471 Aftercare following joint replacement surgery: Secondary | ICD-10-CM | POA: Diagnosis not present

## 2014-12-03 DIAGNOSIS — R278 Other lack of coordination: Secondary | ICD-10-CM | POA: Diagnosis not present

## 2014-12-03 DIAGNOSIS — R011 Cardiac murmur, unspecified: Secondary | ICD-10-CM | POA: Diagnosis not present

## 2014-12-03 DIAGNOSIS — R112 Nausea with vomiting, unspecified: Secondary | ICD-10-CM | POA: Diagnosis not present

## 2014-12-03 DIAGNOSIS — M1712 Unilateral primary osteoarthritis, left knee: Secondary | ICD-10-CM | POA: Diagnosis not present

## 2014-12-03 DIAGNOSIS — M6281 Muscle weakness (generalized): Secondary | ICD-10-CM | POA: Diagnosis not present

## 2014-12-03 DIAGNOSIS — G20 Parkinson's disease: Secondary | ICD-10-CM | POA: Diagnosis not present

## 2014-12-03 DIAGNOSIS — I4891 Unspecified atrial fibrillation: Secondary | ICD-10-CM | POA: Diagnosis not present

## 2014-12-03 DIAGNOSIS — M1990 Unspecified osteoarthritis, unspecified site: Secondary | ICD-10-CM | POA: Diagnosis not present

## 2014-12-03 DIAGNOSIS — R2681 Unsteadiness on feet: Secondary | ICD-10-CM | POA: Diagnosis not present

## 2014-12-03 DIAGNOSIS — Z8669 Personal history of other diseases of the nervous system and sense organs: Secondary | ICD-10-CM | POA: Diagnosis not present

## 2014-12-03 DIAGNOSIS — F419 Anxiety disorder, unspecified: Secondary | ICD-10-CM | POA: Diagnosis not present

## 2014-12-03 DIAGNOSIS — Z87438 Personal history of other diseases of male genital organs: Secondary | ICD-10-CM | POA: Diagnosis not present

## 2014-12-03 DIAGNOSIS — Z79899 Other long term (current) drug therapy: Secondary | ICD-10-CM | POA: Diagnosis not present

## 2014-12-03 DIAGNOSIS — Z823 Family history of stroke: Secondary | ICD-10-CM | POA: Diagnosis not present

## 2014-12-03 DIAGNOSIS — R3915 Urgency of urination: Secondary | ICD-10-CM | POA: Diagnosis not present

## 2014-12-03 DIAGNOSIS — N182 Chronic kidney disease, stage 2 (mild): Secondary | ICD-10-CM | POA: Diagnosis not present

## 2014-12-03 DIAGNOSIS — E785 Hyperlipidemia, unspecified: Secondary | ICD-10-CM | POA: Diagnosis not present

## 2014-12-03 DIAGNOSIS — N400 Enlarged prostate without lower urinary tract symptoms: Secondary | ICD-10-CM | POA: Diagnosis not present

## 2014-12-03 DIAGNOSIS — K5901 Slow transit constipation: Secondary | ICD-10-CM | POA: Diagnosis not present

## 2014-12-03 DIAGNOSIS — R2689 Other abnormalities of gait and mobility: Secondary | ICD-10-CM | POA: Diagnosis not present

## 2014-12-03 DIAGNOSIS — M25562 Pain in left knee: Secondary | ICD-10-CM | POA: Diagnosis not present

## 2014-12-03 DIAGNOSIS — I10 Essential (primary) hypertension: Secondary | ICD-10-CM | POA: Diagnosis not present

## 2014-12-03 DIAGNOSIS — G629 Polyneuropathy, unspecified: Secondary | ICD-10-CM | POA: Diagnosis not present

## 2014-12-03 DIAGNOSIS — F329 Major depressive disorder, single episode, unspecified: Secondary | ICD-10-CM | POA: Diagnosis not present

## 2014-12-03 DIAGNOSIS — Z872 Personal history of diseases of the skin and subcutaneous tissue: Secondary | ICD-10-CM | POA: Diagnosis not present

## 2014-12-03 DIAGNOSIS — Z7901 Long term (current) use of anticoagulants: Secondary | ICD-10-CM | POA: Diagnosis not present

## 2014-12-03 DIAGNOSIS — R1110 Vomiting, unspecified: Secondary | ICD-10-CM | POA: Diagnosis not present

## 2014-12-03 LAB — CBC
HCT: 36.2 % — ABNORMAL LOW (ref 39.0–52.0)
HEMOGLOBIN: 12.2 g/dL — AB (ref 13.0–17.0)
MCH: 31.4 pg (ref 26.0–34.0)
MCHC: 33.7 g/dL (ref 30.0–36.0)
MCV: 93.3 fL (ref 78.0–100.0)
PLATELETS: 137 10*3/uL — AB (ref 150–400)
RBC: 3.88 MIL/uL — AB (ref 4.22–5.81)
RDW: 13.4 % (ref 11.5–15.5)
WBC: 8.7 10*3/uL (ref 4.0–10.5)

## 2014-12-03 LAB — PROTIME-INR
INR: 1.82 — AB (ref 0.00–1.49)
PROTHROMBIN TIME: 21 s — AB (ref 11.6–15.2)

## 2014-12-03 MED ORDER — METHYLPREDNISOLONE ACETATE 40 MG/ML IJ SUSP
80.0000 mg | Freq: Once | INTRAMUSCULAR | Status: AC
  Administered 2014-12-03: 80 mg via INTRA_ARTICULAR
  Filled 2014-12-03: qty 2

## 2014-12-03 MED ORDER — LIDOCAINE HCL 1 % IJ SOLN
20.0000 mL | Freq: Once | INTRAMUSCULAR | Status: AC
  Administered 2014-12-03: 20 mL

## 2014-12-03 MED ORDER — TRAMADOL HCL 50 MG PO TABS: 50.0000 mg | ORAL_TABLET | Freq: Four times a day (QID) | ORAL | Status: DC | PRN

## 2014-12-03 MED ORDER — WARFARIN SODIUM 5 MG PO TABS
5.0000 mg | ORAL_TABLET | Freq: Every day | ORAL | Status: DC
Start: 2014-12-03 — End: 2014-12-03
  Filled 2014-12-03: qty 1

## 2014-12-03 MED ORDER — HYDROCODONE-ACETAMINOPHEN 5-325 MG PO TABS: 1.0000 | ORAL_TABLET | ORAL | Status: DC | PRN

## 2014-12-03 NOTE — Progress Notes (Signed)
Made several phone calls clarifying and getting discharge in order. Hong Moring, [employer]

## 2014-12-03 NOTE — Plan of Care (Signed)
Problem: Discharge Progression Outcomes Goal: Activity appropriate for discharge plan Outcome: Completed/Met Date Met:  12/03/14 To SNF for rehab

## 2014-12-03 NOTE — Care Management Important Message (Signed)
Important Message  Patient Details  Name: Brian [name] MRN: [id number] Date of Birth: 1940/04/16   Medicare Important Message Given:  Yes-second notification given    Erenest Rasher, RN 12/03/2014, 9:50 AM

## 2014-12-03 NOTE — Progress Notes (Addendum)
Subjective: 3 Days Post-Op Procedure(s) (LRB): TOTAL KNEE ARTHROPLASTY (Left) Patient reports pain as mild.   Patient seen in rounds with Dr. Wynelle Link. More alert today.  Pain under control. Patient is well, but has had some minor complaints of pain in the knee, requiring pain medications Patient is ready to go to [facility] when bed available.  Objective: Vital signs in last 24 hours: Temp:  [98.4 F (36.9 C)-99.1 F (37.3 C)] 99.1 F (37.3 C) (09/11 0631) Pulse Rate:  [76-84] 76 (09/11 0631) Resp:  [19] 19 (09/11 0631) BP: (122-148)/(59-93) 123/59 mmHg (09/11 0631) SpO2:  [97 %-99 %] 98 % (09/11 0631)  Intake/Output from previous day:  Intake/Output Summary (Last 24 hours) at 12/03/14 0839 Last data filed at 12/03/14 0600  Gross per 24 hour  Intake    960 ml  Output    300 ml  Net    660 ml    Labs:  Recent Labs  12/01/14 0525 12/02/14 0605 12/03/14 0554  HGB 12.8* 13.0 12.2*    Recent Labs  12/02/14 0605 12/03/14 0554  WBC 8.4 8.7  RBC 4.10* 3.88*  HCT 38.0* 36.2*  PLT 157 137*    Recent Labs  12/01/14 0525  NA 136  K 3.8  CL 103  CO2 25  BUN 11  CREATININE 0.90  GLUCOSE 138*  CALCIUM 9.2    Recent Labs  12/02/14 0605 12/03/14 0554  INR 1.63* 1.82*    EXAM: General - Patient is Alert and Appropriate Extremity - Neurovascular intact Sensation intact distally Dorsiflexion/Plantar flexion intact Incision - clean, dry, no drainage, staples intact Motor Function - intact, moving foot and toes well on exam.   Assessment/Plan: 3 Days Post-Op Procedure(s) (LRB): TOTAL KNEE ARTHROPLASTY (Left) Procedure(s) (LRB): TOTAL KNEE ARTHROPLASTY (Left) Past Medical History  Diagnosis Date  . Dyslipidemia   . Mitral regurgitation     Mild  . Abnormal PFT 1. 05/18/08  2. 11/30/08    1. Showed mild airflow obstruction, mild restriction, mild diffusion defect; FEV1 2.22(64%), FVC 3.33(65%), FEVi% 67, TLC 5.19(69%), DLCO 77%, +BD  2. FEV1 2.38(73%), FVC  3.81(80%), FEV1% 63, TLC 5.61(80%), DLCO 79%, no BD  . Parkinsonism 06/22/2012  . Paresthesias 06/22/2012  . Hyperlipidemia   . GERD (gastroesophageal reflux disease)   . Erectile dysfunction   . BPH (benign prostatic hyperplasia)   . Restrictive lung disease   . CKD (chronic kidney disease) stage 2, GFR 60-89 ml/min   . Cellulitis     right leg MRSA  . Osteoarthritis   . Allergic rhinitis   . Anxiety   . Hypertension   . Atrial fibrillation     persistent afib  . Heart murmur     as a teenager    Principal Problem:   Primary osteoarthritis of left knee Active Problems:   Essential hypertension   Atrial fibrillation   Parkinsonism   Restrictive lung disease   CKD (chronic kidney disease) stage 2, GFR 60-89 ml/min  Estimated body mass index is 31.83 kg/(m^2) as calculated from the following:   Height as of this encounter: 6\' 2"  (1.88 m).   Weight as of this encounter: 112.492 kg (248 lb). Up with therapy Discharge to SNF Diet - Cardiac diet Follow up - in 2 weeks Activity - WBAT Disposition - Skilled nursing facility Condition Upon Discharge - Stable D/C Meds - See DC Summary DVT Prophylaxis - Lovenox and Coumadin  Arlee Muslim, PA-C Orthopaedic Surgery 12/03/2014, 8:39 AM  Addendum: Patient with right  knee pain and swelling that has been interfering with his mobility.  He states that he has had injections into that right knee with benefit in the past.  Aspiration/Injection Procedure Note GAYLIN BULTHUIS [id number] 12-May-1940  Procedure: Aspiration and Injection Right Knee Indications: Painful effusion  Procedure Details Consent: Risks of procedure as well as the alternatives and risks of each were explained to the (patient/caregiver).  Consent for procedure obtained.  Local Anesthesia Used:Lidocaine 1% plain; 60mL Amount of Fluid Aspirated: 55mL Character of Fluid: clear and straw colored Fluid not sent A sterile dressing was applied.  Patient did tolerate  procedure well. Estimated blood loss: Zero  Mickel Crow 12/03/2014, 2:33 PM

## 2014-12-03 NOTE — Progress Notes (Signed)
Discharged from floor via stretcher, EMT with pt, belongings with wife. No changes in assessment.  Kalilah Barua, [employer]

## 2014-12-03 NOTE — Progress Notes (Signed)
First attempt to give report to [facility] Second attempt to call report ended when cut off but [place] called back & report given. Ashford Clouse, [employer]

## 2014-12-03 NOTE — Clinical Social Work Placement (Signed)
   CLINICAL SOCIAL WORK PLACEMENT  NOTE  Date:  12/03/2014  Patient Details  Name: Brian [name] MRN: [id number] Date of Birth: 1940/03/30  Clinical Social Work is seeking post-discharge placement for this patient at the [place] level of care (*CSW will initial, date and re-position this form in  chart as items are completed):  No   Patient/family provided with [place] Work Department's list of facilities offering this level of care within the geographic area requested by the patient (or if unable, by the patient's family).  Yes   Patient/family informed of their freedom to choose among providers that offer the needed level of care, that participate in Medicare, Medicaid or managed care program needed by the patient, have an available bed and are willing to accept the patient.  No   Patient/family informed of [place]'s ownership interest in [facility] and [facility], as well as of the fact that they are under no obligation to receive care at these facilities.  PASRR submitted to EDS on 12/01/14     PASRR number received on 12/01/14     Existing PASRR number confirmed on       FL2 transmitted to all facilities in geographic area requested by pt/family on       FL2 transmitted to all facilities within larger geographic area on 12/01/14     Patient informed that his/her managed care company has contracts with or will negotiate with certain facilities, including the following:        Yes   Patient/family informed of bed offers received.  Patient chooses bed at [facility]     Physician recommends and patient chooses bed at [facility]    Patient to be transferred to [facility] on  .December 03, 2014  Patient to be transferred to facility by     ambulance  Patient family notified on   December 03, 2014 of transfer.  Name of family member notified:      Barbara/wife  PHYSICIAN       Additional Comment:     _______________________________________________ Carlean Jews, LCSW 12/03/2014, 4:05 PM

## 2014-12-03 NOTE — Progress Notes (Signed)
Writer received notification from Brian [name] that pt has had 3 runs of 2 second pauses since last night. Writer informed pt's nurse, Lovena Le, who called MD (Dr. Wynelle Cleveland) and informed of these pauses. MD stated to "Lovena Le" that she would make some medication changes on pt discharge to [facility].

## 2014-12-03 NOTE — Progress Notes (Signed)
Physical Therapy Treatment Patient Details Name: Brian [name] MRN: [id number] DOB: 1940/10/05 Today's Date: 12/03/2014    History of Present Illness s/p L TKA; PMHx: parkinson's, afib    PT Comments    Progressing slowly, right knee pain limiting standing/gait today; pt is pleasant and very cooperative with PT  Follow Up Recommendations  SNF;Supervision/Assistance - 24 hour     Equipment Recommendations  None recommended by PT    Recommendations for Other Services       Precautions / Restrictions Precautions Precautions: Knee;Fall Required Braces or Orthoses: Knee Immobilizer - Left Knee Immobilizer - Left: Discontinue once straight leg raise with < 10 degree lag Restrictions Weight Bearing Restrictions: No Other Position/Activity Restrictions: WBAT    Mobility  Bed Mobility Overal bed mobility: Needs Assistance;+2 for physical assistance Bed Mobility: Supine to Sit;Sit to Supine     Supine to sit: Max assist;+2 for physical assistance Sit to supine: Max assist;+2 for physical assistance   General bed mobility comments: cues for technique and self assist; +2 for trunk and LEs, incr time  Transfers Overall transfer level: Needs assistance Equipment used: Rolling walker (2 wheeled) Transfers: Sit to/from Stand Sit to Stand: Total assist;+2 physical assistance;From elevated surface         General transfer comment: pt with great diffficulty coming to stand, requiring +2  assist for wt shift, therapist blocking right knee, multi-modal cues for trunk/hip extension, right knee extension, use of UEs  Ambulation/Gait             General Gait Details: unable d/t R knee pain   Stairs            Wheelchair Mobility    Modified Rankin (Stroke Patients Only)       Balance   Sitting-balance support: No upper extremity supported;Feet supported Sitting balance-Leahy Scale: Fair                              Cognition Arousal/Alertness:  Awake/alert Behavior During Therapy: WFL for tasks assessed/performed;Flat affect   Area of Impairment: Following commands       Following Commands: Follows one step commands with increased time     Problem Solving: Decreased initiation;Requires verbal cues;Requires tactile cues;Slow processing General Comments: h/o parkinson's -moves slower    Exercises Total Joint Exercises Ankle Circles/Pumps: AAROM;Both;10 reps Quad Sets: AROM;Both;10 reps Heel Slides: AAROM;10 reps;Both    General Comments        Pertinent Vitals/Pain Pain Assessment: Faces Faces Pain Scale: Hurts little more Pain Location: bil knees, right>left Pain Descriptors / Indicators: Grimacing;Guarding Pain Intervention(s): Ice applied;Limited activity within patient's tolerance;Monitored during session    Home Living                      Prior Function            PT Goals (current goals can now be found in the care plan section) Acute Rehab PT Goals Patient Stated Goal: to go to rehab then home PT Goal Formulation: With patient Time For Goal Achievement: 12/08/14 Potential to Achieve Goals: Good Progress towards PT goals: Progressing toward goals    Frequency  7X/week    PT Plan Current plan remains appropriate    Co-evaluation             End of Session Equipment Utilized During Treatment: Gait belt;Left knee immobilizer Activity Tolerance: Patient limited by pain Patient left: in bed;with call  bell/phone within reach;with family/visitor present     Time: 1747-1595 PT Time Calculation (min) (ACUTE ONLY): 29 min  Charges:  $Gait Training: 8-22 mins $Therapeutic Exercise: 8-22 mins                    G Codes:      Ildefonso Keaney 2014-12-10, 12:13 PM

## 2014-12-03 NOTE — Progress Notes (Signed)
[place] for Coumadin Indication: VTE prophylaxis post-op; Afib  Allergies  Allergen Reactions  . Lisinopril Other (See Comments)    coughing    Patient Measurements: Height: 6\' 2"  (188 cm) Weight: 248 lb (112.492 kg) IBW/kg (Calculated) : 82.2  Vital Signs: Temp: 99.1 F (37.3 C) (09/11 0631) Temp Source: Axillary (09/11 0631) BP: 123/59 mmHg (09/11 0631) Pulse Rate: 76 (09/11 0631)  Labs:  Recent Labs  12/01/14 0525 12/02/14 0605 12/03/14 0554  HGB 12.8* 13.0 12.2*  HCT 36.6* 38.0* 36.2*  PLT 171 157 137*  LABPROT 17.3* 19.3* 21.0*  INR 1.41 1.63* 1.82*  CREATININE 0.90  --   --     Estimated Creatinine Clearance: 99 mL/min (by C-G formula based on Cr of 0.9).   Medical History: Past Medical History  Diagnosis Date  . Dyslipidemia   . Mitral regurgitation     Mild  . Abnormal PFT 1. 05/18/08  2. 11/30/08    1. Showed mild airflow obstruction, mild restriction, mild diffusion defect; FEV1 2.22(64%), FVC 3.33(65%), FEVi% 67, TLC 5.19(69%), DLCO 77%, +BD  2. FEV1 2.38(73%), FVC 3.81(80%), FEV1% 63, TLC 5.61(80%), DLCO 79%, no BD  . Parkinsonism 06/22/2012  . Paresthesias 06/22/2012  . Hyperlipidemia   . GERD (gastroesophageal reflux disease)   . Erectile dysfunction   . BPH (benign prostatic hyperplasia)   . Restrictive lung disease   . CKD (chronic kidney disease) stage 2, GFR 60-89 ml/min   . Cellulitis     right leg MRSA  . Osteoarthritis   . Allergic rhinitis   . Anxiety   . Hypertension   . Atrial fibrillation     persistent afib  . Heart murmur     as a teenager     Medications:  Prescriptions prior to admission  Medication Sig Dispense Refill Last Dose  . amLODipine (NORVASC) 10 MG tablet TAKE ONE TABLET BY MOUTH ONCE DAILY. 30 tablet 0 11/30/2014 at 0445  . BENICAR 40 MG tablet TAKE ONE TABLET BY MOUTH ONCE DAILY 30 tablet 4 11/29/2014 at Unknown time  . BYSTOLIC 10 MG tablet Take 10 mg by mouth daily.     11/30/2014 at 0445  . carbidopa-levodopa (SINEMET CR) 50-200 MG per tablet Take 1 tablet by mouth at bedtime. 90 tablet 3 11/29/2014 at 2000  . carbidopa-levodopa (SINEMET IR) 25-100 MG per tablet Take 1.5 tablets by mouth 4 (four) times daily. 7 am, 11 am, 3 pm and 7 pm 540 tablet 3 11/30/2014 at 0445  . Cholecalciferol (VITAMIN D3) 1000 UNITS CAPS Take 1,000 Units by mouth daily.    11/29/2014 at Unknown time  . clonazePAM (KLONOPIN) 0.5 MG disintegrating tablet Take 1 tablet (0.5 mg total) by mouth at bedtime. 90 tablet 3 11/29/2014 at 2000  . doxazosin (CARDURA) 2 MG tablet TAKE THREE TABLETS BY MOUTH ONCE DAILY. (Patient taking differently: 1 tablet TID) 90 tablet 0 11/30/2014 at 0445  . escitalopram (LEXAPRO) 5 MG tablet Take 1 tablet (5 mg total) by mouth daily. 90 tablet 3 11/30/2014 at 0445  . famotidine (PEPCID) 40 MG tablet Take 40 mg by mouth daily.    11/30/2014 at 0445  . furosemide (LASIX) 20 MG tablet Take 20 mg by mouth 2 (two) times daily.    11/29/2014 at Unknown time  . gabapentin (NEURONTIN) 100 MG capsule Take 1 capsule (100 mg total) by mouth 3 (three) times daily. 270 capsule 3 11/29/2014 at Unknown time  . ketoconazole (NIZORAL) 2 % cream  Apply 2 application topically 2 (two) times a week.   Taking  . KLOR-CON M10 10 MEQ tablet Take 1 tablet by mouth daily.   11/29/2014 at Unknown time  . Multiple Vitamins-Minerals (CENTRUM SILVER PO) Take 1 tablet by mouth daily.    11/29/2014 at Unknown time  . oxybutynin (DITROPAN) 5 MG tablet Take 5 mg by mouth daily.    11/29/2014 at Unknown time  . pravastatin (PRAVACHOL) 40 MG tablet Take 40 mg by mouth daily.    11/29/2014 at Unknown time  . warfarin (COUMADIN) 5 MG tablet Take as directed by  Coumadin clinic (Patient taking differently: Take 5 mg by mouth daily. Take as directed by  Coumadin clinic) 135 tablet 1 11/24/2014    Assessment: Patient's a 74 yo M on chronic coumadin for Afib, who was admitted on 9/8 for L TKA.  INR was at baseline on admit after  being held x 5 days pre-op (last dose 11/24/14).  Per Coumadin clinic records he has been therapeutic on 5mg  daily for >6 months.   Today, 12/03/2014:   INR sub-therapeutic at 1.82 (no bridging per cardiologist), but rising nicely towards goalrange  No major drug interactions  Hgb stable, plt trending down  No bleeding documented  Diet: regular diet (eating 75-100% of meals)  Goal of Therapy:  INR 2-3 Monitor platelets by anticoagulation protocol: Yes   Plan:  - Resume home Coumadin regimen of 5mg  po daily - Daily INR - d/c Lovenox 30mg  SQ daily (per MD's admin instruction to d/c once INR >1.8) - Plan for [facility] discharge to [facility] when bed available  Beckham Buxbaum P 12/03/2014,10:18 AM

## 2014-12-04 ENCOUNTER — Non-Acute Institutional Stay (SKILLED_NURSING_FACILITY): Payer: Medicare Other | Admitting: Adult Health

## 2014-12-04 ENCOUNTER — Encounter: Payer: Self-pay | Admitting: Adult Health

## 2014-12-04 DIAGNOSIS — I10 Essential (primary) hypertension: Secondary | ICD-10-CM | POA: Diagnosis not present

## 2014-12-04 DIAGNOSIS — F329 Major depressive disorder, single episode, unspecified: Secondary | ICD-10-CM

## 2014-12-04 DIAGNOSIS — M1712 Unilateral primary osteoarthritis, left knee: Secondary | ICD-10-CM | POA: Diagnosis not present

## 2014-12-04 DIAGNOSIS — R609 Edema, unspecified: Secondary | ICD-10-CM

## 2014-12-04 DIAGNOSIS — G629 Polyneuropathy, unspecified: Secondary | ICD-10-CM

## 2014-12-04 DIAGNOSIS — G20 Parkinson's disease: Secondary | ICD-10-CM

## 2014-12-04 DIAGNOSIS — K219 Gastro-esophageal reflux disease without esophagitis: Secondary | ICD-10-CM

## 2014-12-04 DIAGNOSIS — E785 Hyperlipidemia, unspecified: Secondary | ICD-10-CM

## 2014-12-04 DIAGNOSIS — I480 Paroxysmal atrial fibrillation: Secondary | ICD-10-CM

## 2014-12-04 DIAGNOSIS — R600 Localized edema: Secondary | ICD-10-CM

## 2014-12-04 DIAGNOSIS — R3915 Urgency of urination: Secondary | ICD-10-CM

## 2014-12-04 DIAGNOSIS — E876 Hypokalemia: Secondary | ICD-10-CM

## 2014-12-04 DIAGNOSIS — N400 Enlarged prostate without lower urinary tract symptoms: Secondary | ICD-10-CM | POA: Diagnosis not present

## 2014-12-04 DIAGNOSIS — F419 Anxiety disorder, unspecified: Secondary | ICD-10-CM | POA: Diagnosis not present

## 2014-12-04 DIAGNOSIS — F32A Depression, unspecified: Secondary | ICD-10-CM

## 2014-12-08 ENCOUNTER — Non-Acute Institutional Stay (SKILLED_NURSING_FACILITY): Payer: Medicare Other | Admitting: Internal Medicine

## 2014-12-08 DIAGNOSIS — K5901 Slow transit constipation: Secondary | ICD-10-CM

## 2014-12-08 DIAGNOSIS — I480 Paroxysmal atrial fibrillation: Secondary | ICD-10-CM | POA: Diagnosis not present

## 2014-12-08 DIAGNOSIS — D62 Acute posthemorrhagic anemia: Secondary | ICD-10-CM

## 2014-12-08 DIAGNOSIS — R2681 Unsteadiness on feet: Secondary | ICD-10-CM

## 2014-12-08 DIAGNOSIS — E785 Hyperlipidemia, unspecified: Secondary | ICD-10-CM

## 2014-12-08 DIAGNOSIS — M1712 Unilateral primary osteoarthritis, left knee: Secondary | ICD-10-CM

## 2014-12-08 DIAGNOSIS — K219 Gastro-esophageal reflux disease without esophagitis: Secondary | ICD-10-CM

## 2014-12-08 DIAGNOSIS — G20 Parkinson's disease: Secondary | ICD-10-CM

## 2014-12-08 DIAGNOSIS — I10 Essential (primary) hypertension: Secondary | ICD-10-CM | POA: Diagnosis not present

## 2014-12-08 DIAGNOSIS — N400 Enlarged prostate without lower urinary tract symptoms: Secondary | ICD-10-CM | POA: Diagnosis not present

## 2014-12-08 NOTE — Progress Notes (Signed)
Patient ID: Brian [name], male   DOB: 21-Jul-1940, 74 y.o.   MRN: [id number]     [facility]   PCP: Wenda Low, MD  Code Status: full code  Allergies  Allergen Reactions  . Lisinopril Other (See Comments)    coughing    Chief Complaint  Patient presents with  . New Admit To SNF     HPI:  74 y.o. patient is here for short term rehabilitation post hospital admission from 11/30/14-12/03/14 with left knee OA. he underwent left total knee arthroplasty. he also underwent right knee aspiration and injection due to painful effusion. He is seen in his room today. His pain is under control with current pain regimen. Denies any muscle spasm. Has been constipated with last bowel movement 4 days back. At home had bowel movement everyday  Review of Systems:  Constitutional: Negative for fever, chills, diaphoresis.  HENT: Negative for headache, congestion, nasal discharge, difficulty swallowing.   Eyes: Negative for eye pain, blurred vision, double vision and discharge.  Respiratory: Negative for cough, shortness of breath and wheezing.   Cardiovascular: Negative for chest pain, palpitations, leg swelling.  Gastrointestinal: Negative for heartburn, nausea, vomiting, abdominal pain. Genitourinary: Negative for dysuria and flank pain.  Musculoskeletal: Negative for back pain, falls Skin: Negative for itching, rash.  Neurological: Negative for dizziness, tingling, focal weakness Psychiatric/Behavioral: Negative for depression   Past Medical History  Diagnosis Date  . Dyslipidemia   . Mitral regurgitation     Mild  . Abnormal PFT 1. 05/18/08  2. 11/30/08    1. Showed mild airflow obstruction, mild restriction, mild diffusion defect; FEV1 2.22(64%), FVC 3.33(65%), FEVi% 67, TLC 5.19(69%), DLCO 77%, +BD  2. FEV1 2.38(73%), FVC 3.81(80%), FEV1% 63, TLC 5.61(80%), DLCO 79%, no BD  . Parkinsonism 06/22/2012  . Paresthesias 06/22/2012  . Hyperlipidemia   . GERD  (gastroesophageal reflux disease)   . Erectile dysfunction   . BPH (benign prostatic hyperplasia)   . Restrictive lung disease   . CKD (chronic kidney disease) stage 2, GFR 60-89 ml/min   . Cellulitis     right leg MRSA  . Osteoarthritis   . Allergic rhinitis   . Anxiety   . Hypertension   . Atrial fibrillation     persistent afib  . Heart murmur     as a teenager    Past Surgical History  Procedure Laterality Date  . Cataract extraction    . Cardioversion      multiple  . Cardiac catheterization  11/2009    normal coronary arteries  . Colonoscopy    . Total knee arthroplasty Left 11/30/2014    Procedure: TOTAL KNEE ARTHROPLASTY;  Surgeon: Susa Day, MD;  Location: [facility];  Service: Orthopedics;  Laterality: Left;   Social History:   reports that he has never smoked. He has never used smokeless tobacco. He reports that he does not drink alcohol or use illicit drugs.  Family History  Problem Relation Age of Onset  . Diabetes Mother     DM  . Stroke Father     CVA  . Pancreatic cancer Brother   . Pancreatic cancer Other     Nephew    Medications:   Medication List       This list is accurate as of: 12/08/14  9:31 AM.  Always use your most recent med list.               BYSTOLIC 10 MG tablet  Generic  drug:  nebivolol  Take 10 mg by mouth daily.     carbidopa-levodopa 50-200 MG per tablet  Commonly known as:  SINEMET CR  Take 1 tablet by mouth at bedtime.     carbidopa-levodopa 25-100 MG per tablet  Commonly known as:  SINEMET IR  Take 1.5 tablets by mouth 4 (four) times daily. 7 am, 11 am, 3 pm and 7 pm     clonazePAM 0.5 MG disintegrating tablet  Commonly known as:  KLONOPIN  Take 1 tablet (0.5 mg total) by mouth at bedtime.     doxazosin 2 MG tablet  Commonly known as:  CARDURA  TAKE THREE TABLETS BY MOUTH ONCE DAILY.     escitalopram 5 MG tablet  Commonly known as:  LEXAPRO  Take 1 tablet (5 mg total) by mouth daily.     famotidine 40 MG  tablet  Commonly known as:  PEPCID  Take 40 mg by mouth daily.     furosemide 20 MG tablet  Commonly known as:  LASIX  Take 20 mg by mouth 2 (two) times daily.     gabapentin 100 MG capsule  Commonly known as:  NEURONTIN  Take 1 capsule (100 mg total) by mouth 3 (three) times daily.     HYDROcodone-acetaminophen 5-325 MG per tablet  Commonly known as:  NORCO/VICODIN  Take 1-2 tablets by mouth every 4 (four) hours as needed for moderate pain.     ketoconazole 2 % cream  Commonly known as:  NIZORAL  Apply 2 application topically 2 (two) times a week.     KLOR-CON M10 10 MEQ tablet  Generic drug:  potassium chloride  Take 1 tablet by mouth daily.     oxybutynin 5 MG tablet  Commonly known as:  DITROPAN  Take 5 mg by mouth daily.     pravastatin 40 MG tablet  Commonly known as:  PRAVACHOL  Take 40 mg by mouth daily.     traMADol 50 MG tablet  Commonly known as:  ULTRAM  Take 1-2 tablets (50-100 mg total) by mouth every 6 (six) hours as needed (mild pain).     Vitamin D3 1000 UNITS Caps  Take 1,000 Units by mouth daily.     warfarin 5 MG tablet  Commonly known as:  COUMADIN  Take as directed by  Coumadin clinic         Physical Exam Filed Vitals:   12/08/14 0929  BP: 112/66  Pulse: 86  Temp: 97 F (36.1 C)  Resp: 19  Weight: 242 lb 3.2 oz (109.861 kg)  SpO2: 98%    General- elderly male, well built, in no acute distress Head- normocephalic, atraumatic Nose- normal nasal mucosa, no maxillary or frontal sinus tenderness, no nasal discharge Throat- moist mucus membrane, normal oropharynx, dentition is  Eyes- PERRLA, EOMI, no pallor, no icterus, no discharge, normal conjunctiva, normal sclera Neck- no cervical lymphadenopathy Cardiovascular- normal s1,s2, no murmurs, palpable dorsalis pedis and radial pulses, trace left leg edema Respiratory- bilateral clear to auscultation, no wheeze, no rhonchi, no crackles, no use of accessory muscles Abdomen- bowel sounds  present, soft, non tender Musculoskeletal- able to move all 4 extremities, limited left leg range of motion Neurological- no focal deficit, alert and oriented to person, place and time Skin- warm and dry, left knee surgical incision healing well with staples in place and mild peri incision erythema, no signs of infection Psychiatry- normal mood and affect    Labs reviewed: Basic Metabolic Panel:  Recent Labs  11/23/14 1435 12/01/14 0525  NA 141 136  K 4.3 3.8  CL 105 103  CO2 26 25  GLUCOSE 123* 138*  BUN 12 11  CREATININE 1.19 0.90  CALCIUM 9.7 9.2   CBC:  Recent Labs  12/01/14 0525 12/02/14 0605 12/03/14 0554  WBC 8.3 8.4 8.7  HGB 12.8* 13.0 12.2*  HCT 36.6* 38.0* 36.2*  MCV 91.7 92.7 93.3  PLT 171 157 137*   Radiological Exams: Dg Knee 1-2 Views Left  11/23/2014   CLINICAL DATA:  Osteoarthritis.  Preoperative knee replacement  EXAM: LEFT KNEE - 1-2 VIEW  COMPARISON:  March 20, 2011  FINDINGS: Frontal and lateral views were obtained. There is no fracture or dislocation. There is no appreciable joint effusion. There is calcification in the suprapatellar bursa. There is marked narrowing of the patellofemoral joint. There is moderately severe narrowing laterally. There is spurring arising from the patella and lateral compartment. There is also intracondylar spurring. No erosive change. There is a small benign appearing exostosis along the medial distal femoral metaphysis, stable.  IMPRESSION: Osteoarthritic change, most marked in the lateral compartment and patellofemoral joint regions. No acute fracture or joint effusion. Note calcification in suprapatellar bursa.   Electronically Signed   By: Lowella Grip III M.D.   On: 11/23/2014 16:50     Assessment/Plan  Unsteady gait Post left knee severe OA s/p surgical repair. Will have him work with physical therapy and occupational therapy team to help with gait training and muscle strengthening exercises.fall  precautions. Skin care. Encourage to be out of bed.   Left knee OA S/p left TKA. Surgical incision healing well. F/u with orthopedics. Will have patient work with PT/OT as tolerated to regain strength and restore function.  Continue norco 5-325 1-2 tab q4h prn pain with tramadol 50 mg 1-2 tab q6h prn painFall precautions are in place.continue coumadin for dvt prophylaxis. WBAT. Continue vitamin d  Acute blood loss anemia Post op, monitor h&h  Constipation Start miralax 17 g daily with senna s 2 tab qhs and reassess, hydration encouraged  afib Rate controlled.inr 2 on 12/05/14 and recheck on 12/12/14. Continue coumadin 5 mg daily for now. Continue lipitor  HLD Continue lipitor 10 mg daily  HTN Stable, monitor bp, continue bystolic 10 mg daily  Parkinsonism Stable continue home regimen sinemet and neurontin, no changes made  gerd Stable, continue pepcid 40 mg daily  BPH Continue cardura and ditropan, monitor clinically  Goals of care: short term rehabilitation   Labs/tests ordered: cbc  Family/ staff Communication: reviewed care plan with patient and nursing supervisor    Blanchie Serve, MD  [facility] [phone] (Monday-Friday 8 am - 5 pm) [phone] (afterhours)

## 2014-12-12 ENCOUNTER — Other Ambulatory Visit: Payer: Self-pay

## 2014-12-12 MED ORDER — HYDROCODONE-ACETAMINOPHEN 5-325 MG PO TABS: 1.0000 | ORAL_TABLET | ORAL | Status: DC | PRN

## 2014-12-14 DIAGNOSIS — Z96652 Presence of left artificial knee joint: Secondary | ICD-10-CM | POA: Diagnosis not present

## 2014-12-14 DIAGNOSIS — Z471 Aftercare following joint replacement surgery: Secondary | ICD-10-CM | POA: Diagnosis not present

## 2014-12-16 ENCOUNTER — Encounter (HOSPITAL_COMMUNITY): Payer: Self-pay | Admitting: Emergency Medicine

## 2014-12-16 ENCOUNTER — Emergency Department (HOSPITAL_COMMUNITY)
Admission: EM | Admit: 2014-12-16 | Discharge: 2014-12-16 | Disposition: A | Discharge status: Home / Self Care | Payer: Medicare Other | Attending: Emergency Medicine | Admitting: Emergency Medicine

## 2014-12-16 DIAGNOSIS — K5901 Slow transit constipation: Secondary | ICD-10-CM | POA: Diagnosis not present

## 2014-12-16 DIAGNOSIS — N182 Chronic kidney disease, stage 2 (mild): Secondary | ICD-10-CM | POA: Insufficient documentation

## 2014-12-16 DIAGNOSIS — I4891 Unspecified atrial fibrillation: Secondary | ICD-10-CM | POA: Diagnosis not present

## 2014-12-16 DIAGNOSIS — R011 Cardiac murmur, unspecified: Secondary | ICD-10-CM | POA: Insufficient documentation

## 2014-12-16 DIAGNOSIS — Z79899 Other long term (current) drug therapy: Secondary | ICD-10-CM | POA: Insufficient documentation

## 2014-12-16 DIAGNOSIS — F419 Anxiety disorder, unspecified: Secondary | ICD-10-CM | POA: Diagnosis not present

## 2014-12-16 DIAGNOSIS — Z87438 Personal history of other diseases of male genital organs: Secondary | ICD-10-CM | POA: Insufficient documentation

## 2014-12-16 DIAGNOSIS — I129 Hypertensive chronic kidney disease with stage 1 through stage 4 chronic kidney disease, or unspecified chronic kidney disease: Secondary | ICD-10-CM | POA: Insufficient documentation

## 2014-12-16 DIAGNOSIS — R112 Nausea with vomiting, unspecified: Secondary | ICD-10-CM | POA: Diagnosis not present

## 2014-12-16 DIAGNOSIS — Z8669 Personal history of other diseases of the nervous system and sense organs: Secondary | ICD-10-CM | POA: Insufficient documentation

## 2014-12-16 DIAGNOSIS — I10 Essential (primary) hypertension: Secondary | ICD-10-CM | POA: Diagnosis not present

## 2014-12-16 DIAGNOSIS — E785 Hyperlipidemia, unspecified: Secondary | ICD-10-CM | POA: Insufficient documentation

## 2014-12-16 DIAGNOSIS — Z872 Personal history of diseases of the skin and subcutaneous tissue: Secondary | ICD-10-CM | POA: Insufficient documentation

## 2014-12-16 DIAGNOSIS — M1990 Unspecified osteoarthritis, unspecified site: Secondary | ICD-10-CM | POA: Diagnosis not present

## 2014-12-16 DIAGNOSIS — Z7901 Long term (current) use of anticoagulants: Secondary | ICD-10-CM | POA: Insufficient documentation

## 2014-12-16 LAB — COMPREHENSIVE METABOLIC PANEL
ALBUMIN: 3.2 g/dL — AB (ref 3.5–5.0)
ALK PHOS: 58 U/L (ref 38–126)
ALT: 8 U/L — AB (ref 17–63)
AST: 23 U/L (ref 15–41)
Anion gap: 10 (ref 5–15)
BILIRUBIN TOTAL: 1.5 mg/dL — AB (ref 0.3–1.2)
BUN: 12 mg/dL (ref 6–20)
CALCIUM: 9 mg/dL (ref 8.9–10.3)
CO2: 29 mmol/L (ref 22–32)
CREATININE: 1.1 mg/dL (ref 0.61–1.24)
Chloride: 97 mmol/L — ABNORMAL LOW (ref 101–111)
GFR calc Af Amer: >60 mL/min (ref >60)
GFR calc non Af Amer: >60 mL/min (ref >60)
GLUCOSE: 95 mg/dL (ref 65–99)
Potassium: 3.5 mmol/L (ref 3.5–5.1)
Sodium: 136 mmol/L (ref 135–145)
TOTAL PROTEIN: 6.5 g/dL (ref 6.5–8.1)

## 2014-12-16 LAB — CBC WITH DIFFERENTIAL/PLATELET
Basophils Absolute: 0 10*3/uL (ref 0.0–0.1)
Basophils Relative: 0 %
EOS ABS: 0.1 10*3/uL (ref 0.0–0.7)
EOS PCT: 1 %
HCT: 34.6 % — ABNORMAL LOW (ref 39.0–52.0)
Hemoglobin: 11.9 g/dL — ABNORMAL LOW (ref 13.0–17.0)
LYMPHS ABS: 1.3 10*3/uL (ref 0.7–4.0)
Lymphocytes Relative: 18 %
MCH: 32.1 pg (ref 26.0–34.0)
MCHC: 34.4 g/dL (ref 30.0–36.0)
MCV: 93.3 fL (ref 78.0–100.0)
MONOS PCT: 9 %
Monocytes Absolute: 0.7 10*3/uL (ref 0.1–1.0)
Neutro Abs: 5.3 10*3/uL (ref 1.7–7.7)
Neutrophils Relative %: 72 %
PLATELETS: 265 10*3/uL (ref 150–400)
RBC: 3.71 MIL/uL — ABNORMAL LOW (ref 4.22–5.81)
RDW: 13.3 % (ref 11.5–15.5)
WBC: 7.3 10*3/uL (ref 4.0–10.5)

## 2014-12-16 LAB — PROTIME-INR
INR: 2.59 — ABNORMAL HIGH (ref 0.00–1.49)
Prothrombin Time: 27.4 seconds — ABNORMAL HIGH (ref 11.6–15.2)

## 2014-12-16 LAB — TROPONIN I
Troponin I: <0.03 ng/mL (ref <0.031)
Troponin I: <0.03 ng/mL (ref <0.031)

## 2014-12-16 LAB — POC OCCULT BLOOD, ED: FECAL OCCULT BLD: NEGATIVE

## 2014-12-16 MED ORDER — DOCUSATE SODIUM 100 MG PO CAPS: 100.0000 mg | ORAL_CAPSULE | Freq: Two times a day (BID) | ORAL | Status: DC

## 2014-12-16 MED ORDER — ONDANSETRON 4 MG PO TBDP: ORAL_TABLET | ORAL | Status: DC

## 2014-12-16 MED ORDER — CARBIDOPA-LEVODOPA 25-100 MG PO TABS
1.5000 | ORAL_TABLET | Freq: Four times a day (QID) | ORAL | Status: DC
  Administered 2014-12-16: 1.5 via ORAL
  Filled 2014-12-16 (×2): qty 1.5

## 2014-12-16 NOTE — Discharge Instructions (Signed)
Constipation °Constipation is when a person has fewer than three bowel movements a week, has difficulty having a bowel movement, or has stools that are dry, hard, or larger than normal. As people grow older, constipation is more common. If you try to fix constipation with medicines that make you have a bowel movement (laxatives), the problem may get worse. Long-term laxative use may cause the muscles of the colon to become weak. A low-fiber diet, not taking in enough fluids, and taking certain medicines may make constipation worse.  °CAUSES  °· Certain medicines, such as antidepressants, pain medicine, iron supplements, antacids, and water pills.   °· Certain diseases, such as diabetes, irritable bowel syndrome (IBS), thyroid disease, or depression.   °· Not drinking enough water.   °· Not eating enough fiber-rich foods.   °· Stress or travel.   °· Lack of physical activity or exercise.   °· Ignoring the urge to have a bowel movement.   °· Using laxatives too much.   °SIGNS AND SYMPTOMS  °· Having fewer than three bowel movements a week.   °· Straining to have a bowel movement.   °· Having stools that are hard, dry, or larger than normal.   °· Feeling full or bloated.   °· Pain in the lower abdomen.   °· Not feeling relief after having a bowel movement.   °DIAGNOSIS  °Your health care provider will take a medical history and perform a physical exam. Further testing may be done for severe constipation. Some tests may include: °· A barium enema X-ray to examine your rectum, colon, and, sometimes, your small intestine.   °· A sigmoidoscopy to examine your lower colon.   °· A colonoscopy to examine your entire colon. °TREATMENT  °Treatment will depend on the severity of your constipation and what is causing it. Some dietary treatments include drinking more fluids and eating more fiber-rich foods. Lifestyle treatments may include regular exercise. If these diet and lifestyle recommendations do not help, your health care  provider may recommend taking over-the-counter laxative medicines to help you have bowel movements. Prescription medicines may be prescribed if over-the-counter medicines do not work.  °HOME CARE INSTRUCTIONS  °· Eat foods that have a lot of fiber, such as fruits, vegetables, whole grains, and beans. °· Limit foods high in fat and processed sugars, such as french fries, hamburgers, cookies, candies, and soda.   °· A fiber supplement may be added to your diet if you cannot get enough fiber from foods.   °· Drink enough fluids to keep your urine clear or pale yellow.   °· Exercise regularly or as directed by your health care provider.   °· Go to the restroom when you have the urge to go. Do not hold it.   °· Only take over-the-counter or prescription medicines as directed by your health care provider. Do not take other medicines for constipation without talking to your health care provider first.   °SEEK IMMEDIATE MEDICAL CARE IF:  °· You have bright red blood in your stool.   °· Your constipation lasts for more than 4 days or gets worse.   °· You have abdominal or rectal pain.   °· You have thin, pencil-like stools.   °· You have unexplained weight loss. °MAKE SURE YOU:  °· Understand these instructions. °· Will watch your condition. °· Will get help right away if you are not doing well or get worse. °Document Released: 12/07/2003 Document Revised: 03/15/2013 Document Reviewed: 12/20/2012 °ExitCare® Patient Information ©2015 [employer]. This information is not intended to replace advice given to you by your health care provider. Make sure you discuss any questions   you have with your health care provider.  Nausea and Vomiting Nausea is a sick feeling that often comes before throwing up (vomiting). Vomiting is a reflex where stomach contents come out of your mouth. Vomiting can cause severe loss of body fluids (dehydration). Children and elderly adults can become dehydrated quickly, especially if they also have  diarrhea. Nausea and vomiting are symptoms of a condition or disease. It is important to find the cause of your symptoms. CAUSES   Direct irritation of the stomach lining. This irritation can result from increased acid production (gastroesophageal reflux disease), infection, food poisoning, taking certain medicines (such as nonsteroidal anti-inflammatory drugs), alcohol use, or tobacco use.  Signals from the brain.These signals could be caused by a headache, heat exposure, an inner ear disturbance, increased pressure in the brain from injury, infection, a tumor, or a concussion, pain, emotional stimulus, or metabolic problems.  An obstruction in the gastrointestinal tract (bowel obstruction).  Illnesses such as diabetes, hepatitis, gallbladder problems, appendicitis, kidney problems, cancer, sepsis, atypical symptoms of a heart attack, or eating disorders.  Medical treatments such as chemotherapy and radiation.  Receiving medicine that makes you sleep (general anesthetic) during surgery. DIAGNOSIS Your caregiver may ask for tests to be done if the problems do not improve after a few days. Tests may also be done if symptoms are severe or if the reason for the nausea and vomiting is not clear. Tests may include:  Urine tests.  Blood tests.  Stool tests.  Cultures (to look for evidence of infection).  X-rays or other imaging studies. Test results can help your caregiver make decisions about treatment or the need for additional tests. TREATMENT You need to stay well hydrated. Drink frequently but in small amounts.You may wish to drink water, sports drinks, clear broth, or eat frozen ice pops or gelatin dessert to help stay hydrated.When you eat, eating slowly may help prevent nausea.There are also some antinausea medicines that may help prevent nausea. HOME CARE INSTRUCTIONS   Take all medicine as directed by your caregiver.  If you do not have an appetite, do not force yourself to  eat. However, you must continue to drink fluids.  If you have an appetite, eat a normal diet unless your caregiver tells you differently.  Eat a variety of complex carbohydrates (rice, wheat, potatoes, bread), lean meats, yogurt, fruits, and vegetables.  Avoid high-fat foods because they are more difficult to digest.  Drink enough water and fluids to keep your urine clear or pale yellow.  If you are dehydrated, ask your caregiver for specific rehydration instructions. Signs of dehydration may include:  Severe thirst.  Dry lips and mouth.  Dizziness.  Dark urine.  Decreasing urine frequency and amount.  Confusion.  Rapid breathing or pulse. SEEK IMMEDIATE MEDICAL CARE IF:   You have blood or brown flecks (like coffee grounds) in your vomit.  You have black or bloody stools.  You have a severe headache or stiff neck.  You are confused.  You have severe abdominal pain.  You have chest pain or trouble breathing.  You do not urinate at least once every 8 hours.  You develop cold or clammy skin.  You continue to vomit for longer than 24 to 48 hours.  You have a fever. MAKE SURE YOU:   Understand these instructions.  Will watch your condition.  Will get help right away if you are not doing well or get worse. Document Released: 03/10/2005 Document Revised: 06/02/2011 Document Reviewed: 08/07/2010 ExitCare Patient  Information 2015 [place], [place]. This information is not intended to replace advice given to you by your health care provider. Make sure you discuss any questions you have with your health care provider.

## 2014-12-16 NOTE — ED Provider Notes (Signed)
CSN: [id number]     Arrival date & time 12/16/14  1346 History   First MD Initiated Contact with Patient 12/16/14 1347     Chief Complaint  Patient presents with  . Emesis     (Consider location/radiation/quality/duration/timing/severity/associated sxs/prior Treatment) HPI  74 year old male presents with 4 episodes of vomiting since yesterday. Patient is currently in a rehabilitation facility because he had knee surgery and after he vomited this morning the staff called EMS. He stated that the emesis was dark but no blood was seen. Patient has not noticed any blood in his stool or melena. Denies chest pain, dyspnea or abdominal pain. Not currently nauseated, no vomiting. Last emesis several hours ago.  Past Medical History  Diagnosis Date  . Dyslipidemia   . Mitral regurgitation     Mild  . Abnormal PFT 1. 05/18/08  2. 11/30/08    1. Showed mild airflow obstruction, mild restriction, mild diffusion defect; FEV1 2.22(64%), FVC 3.33(65%), FEVi% 67, TLC 5.19(69%), DLCO 77%, +BD  2. FEV1 2.38(73%), FVC 3.81(80%), FEV1% 63, TLC 5.61(80%), DLCO 79%, no BD  . Parkinsonism 06/22/2012  . Paresthesias 06/22/2012  . Hyperlipidemia   . GERD (gastroesophageal reflux disease)   . Erectile dysfunction   . BPH (benign prostatic hyperplasia)   . Restrictive lung disease   . CKD (chronic kidney disease) stage 2, GFR 60-89 ml/min   . Cellulitis     right leg MRSA  . Osteoarthritis   . Allergic rhinitis   . Anxiety   . Hypertension   . Atrial fibrillation     persistent afib  . Heart murmur     as a teenager    Past Surgical History  Procedure Laterality Date  . Cataract extraction    . Cardioversion      multiple  . Cardiac catheterization  11/2009    normal coronary arteries  . Colonoscopy    . Total knee arthroplasty Left 11/30/2014    Procedure: TOTAL KNEE ARTHROPLASTY;  Surgeon: Susa Day, MD;  Location: [facility];  Service: Orthopedics;  Laterality: Left;   Family History  Problem  Relation Age of Onset  . Diabetes Mother     DM  . Stroke Father     CVA  . Pancreatic cancer Brother   . Pancreatic cancer Other     Nephew   Social History  Substance Use Topics  . Smoking status: Never Smoker   . Smokeless tobacco: Never Used  . Alcohol Use: No     Comment: occasionally    Review of Systems  Respiratory: Negative for shortness of breath.   Cardiovascular: Negative for chest pain.  Gastrointestinal: Positive for vomiting. Negative for abdominal pain and blood in stool.  Neurological: Negative for light-headedness.  All other systems reviewed and are negative.     Allergies  Lisinopril  Home Medications   Prior to Admission medications   Medication Sig Start Date End Date Taking? Authorizing Provider  BYSTOLIC 10 MG tablet Take 10 mg by mouth daily.  08/07/14   Historical Provider, MD  carbidopa-levodopa (SINEMET CR) 50-200 MG per tablet Take 1 tablet by mouth at bedtime. 03/27/14   Star Age, MD  carbidopa-levodopa (SINEMET IR) 25-100 MG per tablet Take 1.5 tablets by mouth 4 (four) times daily. 7 am, 11 am, 3 pm and 7 pm 08/22/14   Star Age, MD  Cholecalciferol (VITAMIN D3) 1000 UNITS CAPS Take 1,000 Units by mouth daily.     Historical Provider, MD  clonazePAM (KLONOPIN) 0.5 MG  disintegrating tablet Take 1 tablet (0.5 mg total) by mouth at bedtime. 06/01/14   Star Age, MD  doxazosin (CARDURA) 2 MG tablet TAKE THREE TABLETS BY MOUTH ONCE DAILY. Patient taking differently: 1 tablet TID 04/25/14   Sueanne Margarita, MD  escitalopram (LEXAPRO) 5 MG tablet Take 1 tablet (5 mg total) by mouth daily. 03/27/14   Star Age, MD  famotidine (PEPCID) 40 MG tablet Take 40 mg by mouth daily.  11/19/12   Historical Provider, MD  furosemide (LASIX) 20 MG tablet Take 20 mg by mouth 2 (two) times daily.  05/31/12   Historical Provider, MD  gabapentin (NEURONTIN) 100 MG capsule Take 1 capsule (100 mg total) by mouth 3 (three) times daily. 03/27/14   Star Age, MD   HYDROcodone-acetaminophen (NORCO/VICODIN) 5-325 MG per tablet Take 1-2 tablets by mouth every 4 (four) hours as needed for moderate pain. 12/12/14   Lauree Chandler, NP  ketoconazole (NIZORAL) 2 % cream Apply 2 application topically 2 (two) times a week. 09/18/13   Historical Provider, MD  KLOR-CON M10 10 MEQ tablet Take 1 tablet by mouth daily. 09/05/13   Historical Provider, MD  oxybutynin (DITROPAN) 5 MG tablet Take 5 mg by mouth daily.  05/18/14   Historical Provider, MD  pravastatin (PRAVACHOL) 40 MG tablet Take 40 mg by mouth daily.     Historical Provider, MD  traMADol (ULTRAM) 50 MG tablet Take 1-2 tablets (50-100 mg total) by mouth every 6 (six) hours as needed (mild pain). 12/03/14   Arlee Muslim, PA-C  warfarin (COUMADIN) 5 MG tablet Take as directed by  Coumadin clinic Patient taking differently: Take 5 mg by mouth daily. Take as directed by  Coumadin clinic 07/21/14   Sueanne Margarita, MD   BP 131/77 mmHg  Pulse 74  Resp 15  Ht 6\' 2"  (1.88 m)  Wt 235 lb (106.595 kg)  BMI 30.16 kg/m2  SpO2 99% Physical Exam  Constitutional: He is oriented to person, place, and time. He appears well-developed and well-nourished.  HENT:  Head: Normocephalic and atraumatic.  Right Ear: External ear normal.  Left Ear: External ear normal.  Nose: Nose normal.  Eyes: Right eye exhibits no discharge. Left eye exhibits no discharge.  Neck: Neck supple.  Cardiovascular: Normal rate, regular rhythm, normal heart sounds and intact distal pulses.   Pulmonary/Chest: Effort normal and breath sounds normal.  Abdominal: Soft. He exhibits no distension. There is no tenderness.  Musculoskeletal: He exhibits no edema.  Neurological: He is alert and oriented to person, place, and time.  Skin: Skin is warm and dry.  Nursing note and vitals reviewed.   ED Course  Procedures (including critical care time) Labs Review Labs Reviewed  COMPREHENSIVE METABOLIC PANEL - Abnormal; Notable for the following:     Chloride 97 (*)    Albumin 3.2 (*)    ALT 8 (*)    Total Bilirubin 1.5 (*)    All other components within normal limits  PROTIME-INR - Abnormal; Notable for the following:    Prothrombin Time 27.4 (*)    INR 2.59 (*)    All other components within normal limits  CBC WITH DIFFERENTIAL/PLATELET - Abnormal; Notable for the following:    RBC 3.71 (*)    Hemoglobin 11.9 (*)    HCT 34.6 (*)    All other components within normal limits  TROPONIN I  TROPONIN I  POC OCCULT BLOOD, ED    Imaging Review No results found. I have personally reviewed and  evaluated these images and lab results as part of my medical decision-making.   EKG Interpretation   Date/Time:  Saturday December 16 2014 14:06:05 EDT Ventricular Rate:  70 PR Interval:    QRS Duration: 99 QT Interval:  417 QTC Calculation: 450 R Axis:   -19 Text Interpretation:  Atrial fibrillation Borderline left axis deviation  nonspecific T wave changes Confirmed by Harsh Trulock  MD, Shaia Porath ([id number]) on  12/16/2014 2:44:59 PM      MDM   Final diagnoses:  None    74 year old male appears quite well, no vomiting at several hours prior to ER arrival. No symptoms at this time. No anginal symptoms but an EKG shows T-wave inversions were not present back in December 2015. Initial troponin is negative, no other signs of angina except for the undifferentiated nausea and vomiting. No other family members are sick. No diarrhea. The vomit was dark but his rectal exam was normal with normal light colored brown stool without blood. I doubt that his emesis had blood in it, especially given that with a therapeutic Coumadin level it would not be likely to stop. His hemoglobin is also normal. With the nonspecific EKG, I feel that he would benefit for a second troponin reevaluation. Will repeat the EKG. Care transferred to Dr. Eulis Foster, plan to re-eval and discuss with cards when 2nd troponin back.    Sherwood Gambler, MD 12/16/14 [phone]

## 2014-12-16 NOTE — ED Notes (Signed)
Pt states that he would like something for constipation. Has not had a BM for a "few days"

## 2014-12-16 NOTE — ED Provider Notes (Signed)
19:00- evaluation after return of second troponin. Patient is being evaluated for possible occult coronary infarct, evidenced by an episode of nausea and vomiting. Second troponin is normal. Patient had a cardiac catheterization 2011 which was remarkable for "normal coronary arteries". He is anticoagulated for chronic atrial fibrillation follows closely with cardiology, last being seen there in 6/16. At this time the patient is comfortable. He has tolerated oral fluids. Findings discussed with patient, and wife. Patient states he has had a bowel movement since he has been here, but he would like to get something "for constipation".  Medical decision making- nonspecific, vomiting, twice yesterday, not recurrent here. Evaluation for complicating factors is negative. I think that this is all likely to represent an anginal equivalent. Most likely his vomiting was secondary to constipation. Patient is stable for discharge with outpatient management and expectant observation.  Plan: Prescriptions for Zofran and Colace. Commended follow-up with PCP in 3 days for reevaluation.  Daleen Bo, MD 12/16/14 [phone]

## 2014-12-16 NOTE — ED Notes (Signed)
Pt to ED via [facility] from [facility] with c/o GI Bleeding-- vomiting blood-- did not see any blood vomitus

## 2014-12-18 ENCOUNTER — Non-Acute Institutional Stay (SKILLED_NURSING_FACILITY): Payer: Medicare Other | Admitting: Adult Health

## 2014-12-18 ENCOUNTER — Encounter: Payer: Self-pay | Admitting: Adult Health

## 2014-12-18 DIAGNOSIS — R600 Localized edema: Secondary | ICD-10-CM

## 2014-12-18 DIAGNOSIS — M1712 Unilateral primary osteoarthritis, left knee: Secondary | ICD-10-CM

## 2014-12-18 DIAGNOSIS — K5901 Slow transit constipation: Secondary | ICD-10-CM

## 2014-12-18 DIAGNOSIS — F329 Major depressive disorder, single episode, unspecified: Secondary | ICD-10-CM | POA: Diagnosis not present

## 2014-12-18 DIAGNOSIS — K219 Gastro-esophageal reflux disease without esophagitis: Secondary | ICD-10-CM | POA: Diagnosis not present

## 2014-12-18 DIAGNOSIS — N400 Enlarged prostate without lower urinary tract symptoms: Secondary | ICD-10-CM | POA: Diagnosis not present

## 2014-12-18 DIAGNOSIS — G629 Polyneuropathy, unspecified: Secondary | ICD-10-CM | POA: Diagnosis not present

## 2014-12-18 DIAGNOSIS — E876 Hypokalemia: Secondary | ICD-10-CM

## 2014-12-18 DIAGNOSIS — I480 Paroxysmal atrial fibrillation: Secondary | ICD-10-CM | POA: Diagnosis not present

## 2014-12-18 DIAGNOSIS — I10 Essential (primary) hypertension: Secondary | ICD-10-CM | POA: Diagnosis not present

## 2014-12-18 DIAGNOSIS — G20C Parkinsonism, unspecified: Secondary | ICD-10-CM

## 2014-12-18 DIAGNOSIS — E785 Hyperlipidemia, unspecified: Secondary | ICD-10-CM | POA: Diagnosis not present

## 2014-12-18 DIAGNOSIS — G20 Parkinson's disease: Secondary | ICD-10-CM | POA: Diagnosis not present

## 2014-12-18 DIAGNOSIS — R3915 Urgency of urination: Secondary | ICD-10-CM

## 2014-12-18 DIAGNOSIS — F419 Anxiety disorder, unspecified: Secondary | ICD-10-CM

## 2014-12-18 DIAGNOSIS — F32A Depression, unspecified: Secondary | ICD-10-CM

## 2014-12-18 DIAGNOSIS — R609 Edema, unspecified: Secondary | ICD-10-CM | POA: Diagnosis not present

## 2014-12-19 ENCOUNTER — Ambulatory Visit: Payer: Medicare Other | Admitting: Neurology

## 2014-12-19 ENCOUNTER — Telehealth: Payer: Self-pay

## 2014-12-19 NOTE — Telephone Encounter (Signed)
Patient did not show to appt today. He may be in rehab after hospital stay.

## 2014-12-19 NOTE — Progress Notes (Signed)
Patient ID: Brian [name], male   DOB: [birth year], 74 y.o.   MRN: [id number]    DATE:  12/04/14 MRN:  [id number]  BIRTHDAY: [birth year]  Facility:  [facility]ursing Home Location:  [place] Room Number: 702-P  LEVEL OF CARE:  SNF (31)  Contact Information    Name Relation Home Work [place] Spouse [phone]  [place]  Patient presents with  . Hospitalization Follow-up    Osteoarthritis S/P left total knee arthroplasty, atrial fibrillation, parkinsonism, depression, hypertension, neuropathy, pedal edema, BPH, urinary urgency, anxiety, GERD, hypokalemia and hyperlipidemia    HISTORY OF PRESENT ILLNESS:  This is a 74 year old male who has been admitted to [facility] on 12/03/14 from [facility]. He has PMH of parkinsonism, hyperlipidemia, GERD, BPH, chronic kidney disease stage II, hypertension and atrial fibrillation on Coumadin. He has osteoarthritis for which he had left total knee arthroplasty on 9/08. While in the PACU, he developed A. fib with RVR. He was given PRN IV Lopressor.  He has been admitted for a short-term rehabilitation.  PAST MEDICAL HISTORY:  Past Medical History  Diagnosis Date  . Dyslipidemia   . Mitral regurgitation     Mild  . Abnormal PFT 1. 05/18/08  2. 11/30/08    1. Showed mild airflow obstruction, mild restriction, mild diffusion defect; FEV1 2.22(64%), FVC 3.33(65%), FEVi% 67, TLC 5.19(69%), DLCO 77%, +BD  2. FEV1 2.38(73%), FVC 3.81(80%), FEV1% 63, TLC 5.61(80%), DLCO 79%, no BD  . Parkinsonism 06/22/2012  . Paresthesias 06/22/2012  . Hyperlipidemia   . GERD (gastroesophageal reflux disease)   . Erectile dysfunction   . BPH (benign prostatic hyperplasia)   . Restrictive lung disease   . CKD (chronic kidney disease) stage 2, GFR 60-89 ml/min   . Cellulitis     right leg MRSA  . Osteoarthritis   . Allergic rhinitis   . Anxiety   . Hypertension   . Atrial fibrillation    persistent afib  . Heart murmur     as a teenager      CURRENT MEDICATIONS: Reviewed  Patient's Medications  New Prescriptions   DOCUSATE SODIUM (COLACE) 100 MG CAPSULE    Take 1 capsule (100 mg total) by mouth every 12 (twelve) hours.   ONDANSETRON (ZOFRAN ODT) 4 MG DISINTEGRATING TABLET    4mg  ODT q6 hours prn nausea/vomit  Previous Medications   BYSTOLIC 10 MG TABLET    Take 10 mg by mouth daily.    CARBIDOPA-LEVODOPA (SINEMET CR) 50-200 MG PER TABLET    Take 1 tablet by mouth at bedtime.   CARBIDOPA-LEVODOPA (SINEMET IR) 25-100 MG PER TABLET    Take 1.5 tablets by mouth 4 (four) times daily. 7 am, 11 am, 3 pm and 7 pm   CHOLECALCIFEROL (VITAMIN D3) 1000 UNITS CAPS    Take 1,000 Units by mouth daily.    CLONAZEPAM (KLONOPIN) 0.5 MG DISINTEGRATING TABLET    Take 1 tablet (0.5 mg total) by mouth at bedtime.   DOXAZOSIN (CARDURA) 2 MG TABLET    TAKE THREE TABLETS BY MOUTH ONCE DAILY.   ESCITALOPRAM (LEXAPRO) 5 MG TABLET    Take 1 tablet (5 mg total) by mouth daily.   FAMOTIDINE (PEPCID) 40 MG TABLET    Take 40 mg by mouth daily.    FUROSEMIDE (LASIX) 20 MG TABLET    Take 20 mg by mouth 2 (two) times daily.    GABAPENTIN (NEURONTIN)  100 MG CAPSULE    Take 1 capsule (100 mg total) by mouth 3 (three) times daily.   KETOCONAZOLE (NIZORAL) 2 % CREAM    Apply 2 application topically 2 (two) times a week.   KLOR-CON M10 10 MEQ TABLET    Take 1 tablet by mouth daily.   OXYBUTYNIN (DITROPAN) 5 MG TABLET    Take 5 mg by mouth daily.    PRAVASTATIN (PRAVACHOL) 40 MG TABLET    Take 40 mg by mouth daily.    TRAMADOL (ULTRAM) 50 MG TABLET    Take 1-2 tablets (50-100 mg total) by mouth every 6 (six) hours as needed (mild pain).   WARFARIN (COUMADIN) 5 MG TABLET    Take as directed by  Coumadin clinic  Modified Medications   Modified Medication Previous Medication   HYDROCODONE-ACETAMINOPHEN (NORCO/VICODIN) 5-325 MG PER TABLET HYDROcodone-acetaminophen (NORCO/VICODIN) 5-325 MG per tablet      Take 1-2  tablets by mouth every 4 (four) hours as needed for moderate pain.    Take 1-2 tablets by mouth every 4 (four) hours as needed for moderate pain.  Discontinued Medications   No medications on file     Allergies  Allergen Reactions  . Lisinopril Other (See Comments)    coughing     REVIEW OF SYSTEMS:  GENERAL: no change in appetite, no fatigue, no weight changes, no fever, chills or weakness EYES: Denies change in vision, dry eyes, eye pain, itching or discharge EARS: Denies change in hearing, ringing in ears, or earache NOSE: Denies nasal congestion or epistaxis MOUTH and THROAT: Denies oral discomfort, gingival pain or bleeding, pain from teeth or hoarseness   RESPIRATORY: no cough, SOB, DOE, wheezing, hemoptysis CARDIAC: no chest pain, edema or palpitations GI: no abdominal pain, diarrhea, constipation, heart burn, nausea or vomiting GU: Denies dysuria, frequency, hematuria, incontinence, or discharge PSYCHIATRIC: Denies feeling of depression or anxiety. No report of hallucinations, insomnia, paranoia, or agitation   PHYSICAL EXAMINATION  GENERAL APPEARANCE: Well nourished. In no acute distress. Obese SKIN:  Left knee surgical site has staples and dry dressing, no erythema HEAD: Normal in size and contour. No evidence of trauma EYES: Lids open and close normally. No blepharitis, entropion or ectropion. PERRL. Conjunctivae are clear and sclerae are white. Lenses are without opacity EARS: Pinnae are normal. Patient hears normal voice tunes of the examiner MOUTH and THROAT: Lips are without lesions. Oral mucosa is moist and without lesions. Tongue is normal in shape, size, and color and without lesions NECK: supple, trachea midline, no neck masses, no thyroid tenderness, no thyromegaly LYMPHATICS: no LAN in the neck, no supraclavicular LAN RESPIRATORY: breathing is even & unlabored, BS CTAB CARDIAC: RRR, no murmur,no extra heart sounds, no edema GI: abdomen soft, normal BS, no  masses, no tenderness, no hepatomegaly, no splenomegaly EXTREMITIES:  Able to move 4 extremities PSYCHIATRIC: Alert and oriented X 3. Affect and behavior are appropriate  LABS/RADIOLOGY: Labs reviewed: Basic Metabolic Panel:  Recent Labs  11/23/14 1435 12/01/14 0525   NA 141 136   K 4.3 3.8   CL 105 103   CO2 26 25   GLUCOSE 123* 138*   BUN 12 11   CREATININE 1.19 0.90   CALCIUM 9.7 9.2       CBC:   Recent Labs  12/02/14 0605 12/03/14 0554   WBC 8.4 8.7   NEUTROABS  --   --    HGB 13.0 12.2*   HCT 38.0* 36.2*   MCV 92.7 93.3  PLT 157 137*      Dg Knee 1-2 Views Left  11/23/2014   CLINICAL DATA:  Osteoarthritis.  Preoperative knee replacement  EXAM: LEFT KNEE - 1-2 VIEW  COMPARISON:  March 20, 2011  FINDINGS: Frontal and lateral views were obtained. There is no fracture or dislocation. There is no appreciable joint effusion. There is calcification in the suprapatellar bursa. There is marked narrowing of the patellofemoral joint. There is moderately severe narrowing laterally. There is spurring arising from the patella and lateral compartment. There is also intracondylar spurring. No erosive change. There is a small benign appearing exostosis along the medial distal femoral metaphysis, stable.  IMPRESSION: Osteoarthritic change, most marked in the lateral compartment and patellofemoral joint regions. No acute fracture or joint effusion. Note calcification in suprapatellar bursa.   Electronically Signed   By: Lowella Grip III M.D.   On: 11/23/2014 16:50   Dg Knee Left Port  11/30/2014   CLINICAL DATA:  Postoperative left knee radiograph, post knee arthroplasty.  EXAM: PORTABLE LEFT KNEE - 1-2 VIEW  COMPARISON:  11/23/2014  FINDINGS: There has been interval total knee arthroplasty. The prosthetic components are well seated. No evidence of immediate complications. There are expected postsurgical changes such is soft tissue swelling and subcutaneous emphysema. Gas within  the suprapatellar joint space is also seen. Skin staples are noted.  IMPRESSION: Status post total left knee arthroplasty. No evidence of immediate complications.   Electronically Signed   By: Fidela Salisbury M.D.   On: 11/30/2014 11:42    ASSESSMENT/PLAN:  Osteoarthritis S/P left total knee arthroplasty - for rehabilitation; continue Norco 5/325 mg 1-2 tabs by mouth every 4 hours when necessary and tramadol 50 mg 1-2 tabs by mouth every 6 hours when necessary for pain; follow-up with Dr. Tonita Cong, orthopedic surgeon, in 2 weeks; Coumadin 5 mg by mouth daily for DVT prophylaxis  Atrial fibrillation - rate controlled; continue Bystolic 10 mg 1 tab by mouth daily and Coumadin 5 mg by mouth daily; check CBC in 1 week  Parkinsonism - continue Sinemet 50-200 mg CR 1 tab by mouth daily at bedtime and Sinemet 25-100 mg 1 1/2 tab by mouth every 7 AM, 11 AM, 3 PM and 7 PM  Depression - mood this is stable; continue Lexapro 5 mg 1 tab by mouth daily  Hypertension - well controlled; continue Bystolic 10 mg 1 tab by mouth daily and Lasix 20 mg by mouth twice a day  Neuropathy - continue Neurontin 100 mg 1 capsule by mouth 3 times a day  Bilateral lower extremity edema - continue Lasix 20 mg by mouth twice a day; check BMP in 1 week  BPH - continue Cardura 2 mg 1 tab by mouth 3 times a day  Urinary urgency - continue oxybutynin 5 mg 1 tab by mouth daily  Anxiety - continue Klonopin 0.5 mg 1 tab by mouth daily at bedtime  GERD - continue famotidine 40 mg 1 tab by mouth daily  Hypokalemia - K3.8; continue Klor-Con 10 meq 1 tab by mouth daily  Hyperlipidemia - continue Pravachol 40 mg 1 tab by mouth daily     Goals of care:  Short-term rehabilitation    [facility], NP [facility] [phone]

## 2014-12-20 ENCOUNTER — Ambulatory Visit (INDEPENDENT_AMBULATORY_CARE_PROVIDER_SITE_OTHER): Payer: Medicare Other | Admitting: Cardiovascular Disease

## 2014-12-20 ENCOUNTER — Encounter: Payer: Self-pay | Admitting: Neurology

## 2014-12-20 DIAGNOSIS — I480 Paroxysmal atrial fibrillation: Secondary | ICD-10-CM

## 2014-12-20 DIAGNOSIS — Z5181 Encounter for therapeutic drug level monitoring: Secondary | ICD-10-CM

## 2014-12-20 DIAGNOSIS — I129 Hypertensive chronic kidney disease with stage 1 through stage 4 chronic kidney disease, or unspecified chronic kidney disease: Secondary | ICD-10-CM | POA: Diagnosis not present

## 2014-12-20 DIAGNOSIS — Z471 Aftercare following joint replacement surgery: Secondary | ICD-10-CM | POA: Diagnosis not present

## 2014-12-20 DIAGNOSIS — G20 Parkinson's disease: Secondary | ICD-10-CM | POA: Diagnosis not present

## 2014-12-20 DIAGNOSIS — G629 Polyneuropathy, unspecified: Secondary | ICD-10-CM | POA: Diagnosis not present

## 2014-12-20 DIAGNOSIS — M1990 Unspecified osteoarthritis, unspecified site: Secondary | ICD-10-CM | POA: Diagnosis not present

## 2014-12-20 LAB — POCT INR: INR: 2.7

## 2014-12-20 NOTE — Progress Notes (Signed)
Patient ID: CROIX PRESLEY, male   DOB: [birth year], 74 y.o.   MRN: [id number]    DATE:  12/18/14 MRN:  [id number]  BIRTHDAY: [birth year]  Facility:  Nursing Home Location:  [place] Room Number: 702-P  LEVEL OF CARE:  SNF (31)  Contact Information    Name Relation Home Work [place] Spouse [phone]  [place]  Patient presents with  . Discharge Note    Osteoarthritis S/P left total knee arthroplasty, atrial fibrillation, parkinsonism, depression, hypertension, neuropathy, pedal edema, BPH, urinary urgency, anxiety, GERD, hypokalemia, constipation and hyperlipidemia    HISTORY OF PRESENT ILLNESS:  This is a 74 year old male who is for discharge home with Home health PT. DME:  Bedside commode and rolling walker . He has been admitted to [facility] on 12/03/14 from [facility]. He has PMH of parkinsonism, hyperlipidemia, GERD, BPH, chronic kidney disease stage II, hypertension and atrial fibrillation on Coumadin. He has osteoarthritis for which he had left total knee arthroplasty on 9/08. While in the PACU, he developed A. fib with RVR. He was given PRN IV Lopressor.  Patient was admitted to this facility for short-term rehabilitation after the patient's recent hospitalization.  Patient has completed SNF rehabilitation and therapy has cleared the patient for discharge.   PAST MEDICAL HISTORY:  Past Medical History  Diagnosis Date  . Dyslipidemia   . Mitral regurgitation     Mild  . Abnormal PFT 1. 05/18/08  2. 11/30/08    1. Showed mild airflow obstruction, mild restriction, mild diffusion defect; FEV1 2.22(64%), FVC 3.33(65%), FEVi% 67, TLC 5.19(69%), DLCO 77%, +BD  2. FEV1 2.38(73%), FVC 3.81(80%), FEV1% 63, TLC 5.61(80%), DLCO 79%, no BD  . Parkinsonism 06/22/2012  . Paresthesias 06/22/2012  . Hyperlipidemia   . GERD (gastroesophageal reflux disease)   . Erectile dysfunction   . BPH (benign prostatic  hyperplasia)   . Restrictive lung disease   . CKD (chronic kidney disease) stage 2, GFR 60-89 ml/min   . Cellulitis     right leg MRSA  . Osteoarthritis   . Allergic rhinitis   . Anxiety   . Hypertension   . Atrial fibrillation     persistent afib  . Heart murmur     as a teenager      CURRENT MEDICATIONS: Reviewed  Patient's Medications  New Prescriptions   No medications on file  Previous Medications   BYSTOLIC 10 MG TABLET    Take 10 mg by mouth daily.    CARBIDOPA-LEVODOPA (SINEMET CR) 50-200 MG PER TABLET    Take 1 tablet by mouth at bedtime.   CARBIDOPA-LEVODOPA (SINEMET IR) 25-100 MG PER TABLET    Take 1.5 tablets by mouth 4 (four) times daily. 7 am, 11 am, 3 pm and 7 pm   CHOLECALCIFEROL (VITAMIN D3) 1000 UNITS CAPS    Take 1,000 Units by mouth daily.    CLONAZEPAM (KLONOPIN) 0.5 MG DISINTEGRATING TABLET    Take 1 tablet (0.5 mg total) by mouth at bedtime.   DOCUSATE SODIUM (COLACE) 100 MG CAPSULE    Take 1 capsule (100 mg total) by mouth every 12 (twelve) hours.   DOXAZOSIN (CARDURA) 2 MG TABLET    TAKE THREE TABLETS BY MOUTH ONCE DAILY.   ESCITALOPRAM (LEXAPRO) 5 MG TABLET    Take 1 tablet (5 mg total) by mouth daily.   FAMOTIDINE (PEPCID) 40 MG TABLET    Take 40  mg by mouth daily.    FUROSEMIDE (LASIX) 20 MG TABLET    Take 20 mg by mouth 2 (two) times daily.    GABAPENTIN (NEURONTIN) 100 MG CAPSULE    Take 1 capsule (100 mg total) by mouth 3 (three) times daily.   HYDROCODONE-ACETAMINOPHEN (NORCO/VICODIN) 5-325 MG PER TABLET    Take 1-2 tablets by mouth every 4 (four) hours as needed for moderate pain.   KETOCONAZOLE (NIZORAL) 2 % CREAM    Apply 2 application topically 2 (two) times a week.   KLOR-CON M10 10 MEQ TABLET    Take 1 tablet by mouth daily.   ONDANSETRON (ZOFRAN ODT) 4 MG DISINTEGRATING TABLET    4mg  ODT q6 hours prn nausea/vomit   OXYBUTYNIN (DITROPAN) 5 MG TABLET    Take 5 mg by mouth daily.    PRAVASTATIN (PRAVACHOL) 40 MG TABLET    Take 40 mg by mouth  daily.    TRAMADOL (ULTRAM) 50 MG TABLET    Take 1-2 tablets (50-100 mg total) by mouth every 6 (six) hours as needed (mild pain).   WARFARIN (COUMADIN) 5 MG TABLET    Take as directed by  Coumadin clinic  Modified Medications   No medications on file  Discontinued Medications   No medications on file     Allergies  Allergen Reactions  . Lisinopril Other (See Comments)    coughing     REVIEW OF SYSTEMS:  GENERAL: no change in appetite, no fatigue, no weight changes, no fever, chills or weakness EYES: Denies change in vision, dry eyes, eye pain, itching or discharge EARS: Denies change in hearing, ringing in ears, or earache NOSE: Denies nasal congestion or epistaxis MOUTH and THROAT: Denies oral discomfort, gingival pain or bleeding, pain from teeth or hoarseness   RESPIRATORY: no cough, SOB, DOE, wheezing, hemoptysis CARDIAC: no chest pain, edema or palpitations GI: no abdominal pain, diarrhea, constipation, heart burn, nausea or vomiting GU: Denies dysuria, frequency, hematuria, incontinence, or discharge PSYCHIATRIC: Denies feeling of depression or anxiety. No report of hallucinations, insomnia, paranoia, or agitation   PHYSICAL EXAMINATION  GENERAL APPEARANCE: Well nourished. In no acute distress. Obese SKIN:  Left knee surgical site has steri-strips, dry,  no erythema HEAD: Normal in size and contour. No evidence of trauma EYES: Lids open and close normally. No blepharitis, entropion or ectropion. PERRL. Conjunctivae are clear and sclerae are white. Lenses are without opacity EARS: Pinnae are normal. Patient hears normal voice tunes of the examiner MOUTH and THROAT: Lips are without lesions. Oral mucosa is moist and without lesions. Tongue is normal in shape, size, and color and without lesions NECK: supple, trachea midline, no neck masses, no thyroid tenderness, no thyromegaly LYMPHATICS: no LAN in the neck, no supraclavicular LAN RESPIRATORY: breathing is even &  unlabored, BS CTAB CARDIAC: RRR, no murmur,no extra heart sounds, no edema GI: abdomen soft, normal BS, no masses, no tenderness, no hepatomegaly, no splenomegaly EXTREMITIES:  Able to move 4 extremities PSYCHIATRIC: Alert and oriented X 3. Affect and behavior are appropriate  LABS/RADIOLOGY: Labs reviewed: 12/11/14  WBC 6.6 hemoglobin 12.5 hematocrit 38.4 MCV 95 platelet count 293 sodium 141 potassium 4.1 glucose 93 BUN 12 creatinine 1.01 calcium 9.5 Basic Metabolic Panel:  Recent Labs  11/23/14 1435 12/01/14 0525 12/16/14 1421  NA 141 136 136  K 4.3 3.8 3.5  CL 105 103 97*  CO2 26 25 29   GLUCOSE 123* 138* 95  BUN 12 11 12   CREATININE 1.19 0.90 1.10  CALCIUM  9.7 9.2 9.0   Liver Function Tests:  Recent Labs  12/16/14 1421  AST 23  ALT 8*  ALKPHOS 58  BILITOT 1.5*  PROT 6.5  ALBUMIN 3.2*    CBC:  Recent Labs  12/02/14 0605 12/03/14 0554 12/16/14 1421  WBC 8.4 8.7 7.3  NEUTROABS  --   --  5.3  HGB 13.0 12.2* 11.9*  HCT 38.0* 36.2* 34.6*  MCV 92.7 93.3 93.3  PLT 157 137* 265   Cardiac Enzymes:  Recent Labs  12/16/14 1421 12/16/14 1726  TROPONINI <0.03 <0.03     Dg Knee 1-2 Views Left  11/23/2014   CLINICAL DATA:  Osteoarthritis.  Preoperative knee replacement  EXAM: LEFT KNEE - 1-2 VIEW  COMPARISON:  March 20, 2011  FINDINGS: Frontal and lateral views were obtained. There is no fracture or dislocation. There is no appreciable joint effusion. There is calcification in the suprapatellar bursa. There is marked narrowing of the patellofemoral joint. There is moderately severe narrowing laterally. There is spurring arising from the patella and lateral compartment. There is also intracondylar spurring. No erosive change. There is a small benign appearing exostosis along the medial distal femoral metaphysis, stable.  IMPRESSION: Osteoarthritic change, most marked in the lateral compartment and patellofemoral joint regions. No acute fracture or joint effusion.  Note calcification in suprapatellar bursa.   Electronically Signed   By: Lowella Grip III M.D.   On: 11/23/2014 16:50   Dg Knee Left Port  11/30/2014   CLINICAL DATA:  Postoperative left knee radiograph, post knee arthroplasty.  EXAM: PORTABLE LEFT KNEE - 1-2 VIEW  COMPARISON:  11/23/2014  FINDINGS: There has been interval total knee arthroplasty. The prosthetic components are well seated. No evidence of immediate complications. There are expected postsurgical changes such is soft tissue swelling and subcutaneous emphysema. Gas within the suprapatellar joint space is also seen. Skin staples are noted.  IMPRESSION: Status post total left knee arthroplasty. No evidence of immediate complications.   Electronically Signed   By: Fidela Salisbury M.D.   On: 11/30/2014 11:42    ASSESSMENT/PLAN:  Osteoarthritis S/P left total knee arthroplasty - for Home health PT; continue Norco 5/325 mg 1-2 tabs by mouth every 4 hours when necessary and tramadol 50 mg 1-2 tabs by mouth every 6 hours when necessary for pain; follow-up with Dr. Tonita Cong, orthopedic surgeon, in 2 weeks; Coumadin 5 mg by mouth daily for DVT prophylaxis  Atrial fibrillation - rate controlled; continue Bystolic 10 mg 1 tab by mouth daily and Coumadin 5 mg by mouth daily; check CBC in 1 week  Parkinsonism - continue Sinemet 50-200 mg CR 1 tab by mouth daily at bedtime and Sinemet 25-100 mg 1 1/2 tab by mouth every 7 AM, 11 AM, 3 PM and 7 PM  Depression - mood this is stable; continue Lexapro 5 mg 1 tab by mouth daily  Hypertension - well controlled; continue Bystolic 10 mg 1 tab by mouth daily and Lasix 20 mg by mouth twice a day  Neuropathy - continue Neurontin 100 mg 1 capsule by mouth 3 times a day  Bilateral lower extremity edema - continue Lasix 20 mg by mouth twice a day; check BMP in 1 week  BPH - continue Cardura 2 mg 1 tab by mouth 3 times a day  Urinary urgency - continue oxybutynin 5 mg 1 tab by mouth daily  Anxiety -  continue Klonopin 0.5 mg 1 tab by mouth daily at bedtime  GERD - continue famotidine 40 mg  1 tab by mouth daily  Hypokalemia - K3.5; continue Klor-Con 10 meq 1 tab by mouth daily  Hyperlipidemia - continue Pravachol 40 mg 1 tab by mouth daily  Constipation - increase senna S 2 tabs by mouth twice a day and MiraLAX 17 g by mouth daily; Fleet's enema 1; lactulose 20 g per 30 mL by mouth twice a day 1 day  Edema of extremities - continue Lasix 20 mg 1 tab by mouth twice a day     I have filled out patient's discharge paperwork and written prescriptions.  Patient will receive home health PT.  DME provided:  Bedside commode and rolling walker  Total discharge time: Greater than 30 minutes  Discharge time involved coordination of the discharge process with social worker, nursing staff and therapy department. Medical justification for home health services/DME verified.     [facility], NP [employer] [phone]

## 2014-12-21 ENCOUNTER — Telehealth: Payer: Self-pay | Admitting: Neurology

## 2014-12-21 DIAGNOSIS — I129 Hypertensive chronic kidney disease with stage 1 through stage 4 chronic kidney disease, or unspecified chronic kidney disease: Secondary | ICD-10-CM | POA: Diagnosis not present

## 2014-12-21 DIAGNOSIS — I480 Paroxysmal atrial fibrillation: Secondary | ICD-10-CM | POA: Diagnosis not present

## 2014-12-21 DIAGNOSIS — G20 Parkinson's disease: Secondary | ICD-10-CM | POA: Diagnosis not present

## 2014-12-21 DIAGNOSIS — G629 Polyneuropathy, unspecified: Secondary | ICD-10-CM | POA: Diagnosis not present

## 2014-12-21 DIAGNOSIS — M1990 Unspecified osteoarthritis, unspecified site: Secondary | ICD-10-CM | POA: Diagnosis not present

## 2014-12-21 DIAGNOSIS — Z471 Aftercare following joint replacement surgery: Secondary | ICD-10-CM | POA: Diagnosis not present

## 2014-12-21 NOTE — Telephone Encounter (Addendum)
Dr Susa Day called requesting to speak with Dr Rexene Alberts. Please call him at [phone].

## 2014-12-22 DIAGNOSIS — G20 Parkinson's disease: Secondary | ICD-10-CM | POA: Diagnosis not present

## 2014-12-22 DIAGNOSIS — I129 Hypertensive chronic kidney disease with stage 1 through stage 4 chronic kidney disease, or unspecified chronic kidney disease: Secondary | ICD-10-CM | POA: Diagnosis not present

## 2014-12-22 DIAGNOSIS — G629 Polyneuropathy, unspecified: Secondary | ICD-10-CM | POA: Diagnosis not present

## 2014-12-22 DIAGNOSIS — M1990 Unspecified osteoarthritis, unspecified site: Secondary | ICD-10-CM | POA: Diagnosis not present

## 2014-12-22 DIAGNOSIS — I480 Paroxysmal atrial fibrillation: Secondary | ICD-10-CM | POA: Diagnosis not present

## 2014-12-22 DIAGNOSIS — Z471 Aftercare following joint replacement surgery: Secondary | ICD-10-CM | POA: Diagnosis not present

## 2014-12-26 DIAGNOSIS — G629 Polyneuropathy, unspecified: Secondary | ICD-10-CM | POA: Diagnosis not present

## 2014-12-26 DIAGNOSIS — M1990 Unspecified osteoarthritis, unspecified site: Secondary | ICD-10-CM | POA: Diagnosis not present

## 2014-12-26 DIAGNOSIS — I480 Paroxysmal atrial fibrillation: Secondary | ICD-10-CM | POA: Diagnosis not present

## 2014-12-26 DIAGNOSIS — I129 Hypertensive chronic kidney disease with stage 1 through stage 4 chronic kidney disease, or unspecified chronic kidney disease: Secondary | ICD-10-CM | POA: Diagnosis not present

## 2014-12-26 DIAGNOSIS — Z471 Aftercare following joint replacement surgery: Secondary | ICD-10-CM | POA: Diagnosis not present

## 2014-12-26 DIAGNOSIS — G20 Parkinson's disease: Secondary | ICD-10-CM | POA: Diagnosis not present

## 2014-12-27 ENCOUNTER — Ambulatory Visit (INDEPENDENT_AMBULATORY_CARE_PROVIDER_SITE_OTHER): Payer: Medicare Other | Admitting: Internal Medicine

## 2014-12-27 DIAGNOSIS — Z5181 Encounter for therapeutic drug level monitoring: Secondary | ICD-10-CM

## 2014-12-27 DIAGNOSIS — I129 Hypertensive chronic kidney disease with stage 1 through stage 4 chronic kidney disease, or unspecified chronic kidney disease: Secondary | ICD-10-CM | POA: Diagnosis not present

## 2014-12-27 DIAGNOSIS — M1990 Unspecified osteoarthritis, unspecified site: Secondary | ICD-10-CM | POA: Diagnosis not present

## 2014-12-27 DIAGNOSIS — G20 Parkinson's disease: Secondary | ICD-10-CM | POA: Diagnosis not present

## 2014-12-27 DIAGNOSIS — I480 Paroxysmal atrial fibrillation: Secondary | ICD-10-CM

## 2014-12-27 DIAGNOSIS — Z471 Aftercare following joint replacement surgery: Secondary | ICD-10-CM | POA: Diagnosis not present

## 2014-12-27 DIAGNOSIS — G629 Polyneuropathy, unspecified: Secondary | ICD-10-CM | POA: Diagnosis not present

## 2014-12-27 LAB — POCT INR: INR: 1.4

## 2014-12-29 DIAGNOSIS — I480 Paroxysmal atrial fibrillation: Secondary | ICD-10-CM | POA: Diagnosis not present

## 2014-12-29 DIAGNOSIS — G629 Polyneuropathy, unspecified: Secondary | ICD-10-CM | POA: Diagnosis not present

## 2014-12-29 DIAGNOSIS — Z471 Aftercare following joint replacement surgery: Secondary | ICD-10-CM | POA: Diagnosis not present

## 2014-12-29 DIAGNOSIS — M1990 Unspecified osteoarthritis, unspecified site: Secondary | ICD-10-CM | POA: Diagnosis not present

## 2014-12-29 DIAGNOSIS — G20 Parkinson's disease: Secondary | ICD-10-CM | POA: Diagnosis not present

## 2014-12-29 DIAGNOSIS — I129 Hypertensive chronic kidney disease with stage 1 through stage 4 chronic kidney disease, or unspecified chronic kidney disease: Secondary | ICD-10-CM | POA: Diagnosis not present

## 2015-01-01 DIAGNOSIS — G629 Polyneuropathy, unspecified: Secondary | ICD-10-CM | POA: Diagnosis not present

## 2015-01-01 DIAGNOSIS — G20 Parkinson's disease: Secondary | ICD-10-CM | POA: Diagnosis not present

## 2015-01-01 DIAGNOSIS — I129 Hypertensive chronic kidney disease with stage 1 through stage 4 chronic kidney disease, or unspecified chronic kidney disease: Secondary | ICD-10-CM | POA: Diagnosis not present

## 2015-01-01 DIAGNOSIS — Z471 Aftercare following joint replacement surgery: Secondary | ICD-10-CM | POA: Diagnosis not present

## 2015-01-01 DIAGNOSIS — M1990 Unspecified osteoarthritis, unspecified site: Secondary | ICD-10-CM | POA: Diagnosis not present

## 2015-01-01 DIAGNOSIS — I480 Paroxysmal atrial fibrillation: Secondary | ICD-10-CM | POA: Diagnosis not present

## 2015-01-03 ENCOUNTER — Ambulatory Visit (INDEPENDENT_AMBULATORY_CARE_PROVIDER_SITE_OTHER): Payer: Medicare Other | Admitting: Internal Medicine

## 2015-01-03 DIAGNOSIS — I480 Paroxysmal atrial fibrillation: Secondary | ICD-10-CM | POA: Diagnosis not present

## 2015-01-03 DIAGNOSIS — G20 Parkinson's disease: Secondary | ICD-10-CM | POA: Diagnosis not present

## 2015-01-03 DIAGNOSIS — M1990 Unspecified osteoarthritis, unspecified site: Secondary | ICD-10-CM | POA: Diagnosis not present

## 2015-01-03 DIAGNOSIS — Z5181 Encounter for therapeutic drug level monitoring: Secondary | ICD-10-CM

## 2015-01-03 DIAGNOSIS — G629 Polyneuropathy, unspecified: Secondary | ICD-10-CM | POA: Diagnosis not present

## 2015-01-03 DIAGNOSIS — Z471 Aftercare following joint replacement surgery: Secondary | ICD-10-CM | POA: Diagnosis not present

## 2015-01-03 DIAGNOSIS — I129 Hypertensive chronic kidney disease with stage 1 through stage 4 chronic kidney disease, or unspecified chronic kidney disease: Secondary | ICD-10-CM | POA: Diagnosis not present

## 2015-01-03 LAB — POCT INR: INR: 2.2

## 2015-01-04 ENCOUNTER — Telehealth: Payer: Self-pay | Admitting: Neurology

## 2015-01-04 DIAGNOSIS — G4752 REM sleep behavior disorder: Secondary | ICD-10-CM

## 2015-01-04 DIAGNOSIS — G20A1 Parkinson's disease without dyskinesia, without mention of fluctuations: Secondary | ICD-10-CM

## 2015-01-04 DIAGNOSIS — F419 Anxiety disorder, unspecified: Secondary | ICD-10-CM

## 2015-01-04 DIAGNOSIS — G20 Parkinson's disease: Secondary | ICD-10-CM

## 2015-01-04 MED ORDER — CARBIDOPA-LEVODOPA 25-100 MG PO TABS: 2.0000 | ORAL_TABLET | Freq: Two times a day (BID) | ORAL | Status: DC

## 2015-01-04 NOTE — Telephone Encounter (Signed)
Pt's wife called stating he had knee replacement 11/30/14, it has been a struggle. Pt said night tremors have increased, he mentioned it to her about a week ago. He is taking carbidopa-levodopa (SINEMET IR) 25-100 MG per tablet 1- 1/2 tab 4 times a day (7am, 11am, 3pm and 7pm) and carbidopa-levodopa (SINEMET CR) 50-200 MG per tablet 1 at bedtime. Please call and advise. She can be reached at [phone]

## 2015-01-04 NOTE — Telephone Encounter (Signed)
I spoke to wife and gave new recommendation. She voices understanding and I will send in new Rx. Confirmed pharmacy.

## 2015-01-04 NOTE — Telephone Encounter (Signed)
Please have him increase sinemet to 2 tabs alternating with 1 1/2 tabs: 2 at 7, 1 1/2 at 11 AM, 2 at 3 PM and 1 1/2 at 7 PM, keep the CR at bedtime, pls call wife and explain, does she need a new Rx for him?

## 2015-01-05 DIAGNOSIS — I480 Paroxysmal atrial fibrillation: Secondary | ICD-10-CM | POA: Diagnosis not present

## 2015-01-05 DIAGNOSIS — M1990 Unspecified osteoarthritis, unspecified site: Secondary | ICD-10-CM | POA: Diagnosis not present

## 2015-01-05 DIAGNOSIS — I129 Hypertensive chronic kidney disease with stage 1 through stage 4 chronic kidney disease, or unspecified chronic kidney disease: Secondary | ICD-10-CM | POA: Diagnosis not present

## 2015-01-05 DIAGNOSIS — G629 Polyneuropathy, unspecified: Secondary | ICD-10-CM | POA: Diagnosis not present

## 2015-01-05 DIAGNOSIS — Z471 Aftercare following joint replacement surgery: Secondary | ICD-10-CM | POA: Diagnosis not present

## 2015-01-05 DIAGNOSIS — G20 Parkinson's disease: Secondary | ICD-10-CM | POA: Diagnosis not present

## 2015-01-07 ENCOUNTER — Other Ambulatory Visit: Payer: Self-pay | Admitting: Adult Health

## 2015-01-08 DIAGNOSIS — I480 Paroxysmal atrial fibrillation: Secondary | ICD-10-CM | POA: Diagnosis not present

## 2015-01-08 DIAGNOSIS — I129 Hypertensive chronic kidney disease with stage 1 through stage 4 chronic kidney disease, or unspecified chronic kidney disease: Secondary | ICD-10-CM | POA: Diagnosis not present

## 2015-01-08 DIAGNOSIS — G20 Parkinson's disease: Secondary | ICD-10-CM | POA: Diagnosis not present

## 2015-01-08 DIAGNOSIS — Z471 Aftercare following joint replacement surgery: Secondary | ICD-10-CM | POA: Diagnosis not present

## 2015-01-08 DIAGNOSIS — G629 Polyneuropathy, unspecified: Secondary | ICD-10-CM | POA: Diagnosis not present

## 2015-01-08 DIAGNOSIS — M1990 Unspecified osteoarthritis, unspecified site: Secondary | ICD-10-CM | POA: Diagnosis not present

## 2015-01-09 DIAGNOSIS — I5032 Chronic diastolic (congestive) heart failure: Secondary | ICD-10-CM | POA: Diagnosis not present

## 2015-01-09 DIAGNOSIS — R600 Localized edema: Secondary | ICD-10-CM | POA: Diagnosis not present

## 2015-01-09 DIAGNOSIS — I482 Chronic atrial fibrillation: Secondary | ICD-10-CM | POA: Diagnosis not present

## 2015-01-10 DIAGNOSIS — G20 Parkinson's disease: Secondary | ICD-10-CM | POA: Diagnosis not present

## 2015-01-10 DIAGNOSIS — I480 Paroxysmal atrial fibrillation: Secondary | ICD-10-CM | POA: Diagnosis not present

## 2015-01-10 DIAGNOSIS — Z471 Aftercare following joint replacement surgery: Secondary | ICD-10-CM | POA: Diagnosis not present

## 2015-01-10 DIAGNOSIS — M1990 Unspecified osteoarthritis, unspecified site: Secondary | ICD-10-CM | POA: Diagnosis not present

## 2015-01-10 DIAGNOSIS — I129 Hypertensive chronic kidney disease with stage 1 through stage 4 chronic kidney disease, or unspecified chronic kidney disease: Secondary | ICD-10-CM | POA: Diagnosis not present

## 2015-01-10 DIAGNOSIS — G629 Polyneuropathy, unspecified: Secondary | ICD-10-CM | POA: Diagnosis not present

## 2015-01-11 DIAGNOSIS — Z96652 Presence of left artificial knee joint: Secondary | ICD-10-CM | POA: Diagnosis not present

## 2015-01-11 DIAGNOSIS — Z471 Aftercare following joint replacement surgery: Secondary | ICD-10-CM | POA: Diagnosis not present

## 2015-01-12 ENCOUNTER — Other Ambulatory Visit: Payer: Self-pay | Admitting: Neurology

## 2015-01-12 DIAGNOSIS — I129 Hypertensive chronic kidney disease with stage 1 through stage 4 chronic kidney disease, or unspecified chronic kidney disease: Secondary | ICD-10-CM | POA: Diagnosis not present

## 2015-01-12 DIAGNOSIS — I480 Paroxysmal atrial fibrillation: Secondary | ICD-10-CM | POA: Diagnosis not present

## 2015-01-12 DIAGNOSIS — M1990 Unspecified osteoarthritis, unspecified site: Secondary | ICD-10-CM | POA: Diagnosis not present

## 2015-01-12 DIAGNOSIS — Z471 Aftercare following joint replacement surgery: Secondary | ICD-10-CM | POA: Diagnosis not present

## 2015-01-12 DIAGNOSIS — G20 Parkinson's disease: Secondary | ICD-10-CM | POA: Diagnosis not present

## 2015-01-12 DIAGNOSIS — G629 Polyneuropathy, unspecified: Secondary | ICD-10-CM | POA: Diagnosis not present

## 2015-01-15 DIAGNOSIS — I129 Hypertensive chronic kidney disease with stage 1 through stage 4 chronic kidney disease, or unspecified chronic kidney disease: Secondary | ICD-10-CM | POA: Diagnosis not present

## 2015-01-15 DIAGNOSIS — I480 Paroxysmal atrial fibrillation: Secondary | ICD-10-CM | POA: Diagnosis not present

## 2015-01-15 DIAGNOSIS — G20 Parkinson's disease: Secondary | ICD-10-CM | POA: Diagnosis not present

## 2015-01-15 DIAGNOSIS — M1990 Unspecified osteoarthritis, unspecified site: Secondary | ICD-10-CM | POA: Diagnosis not present

## 2015-01-15 DIAGNOSIS — Z471 Aftercare following joint replacement surgery: Secondary | ICD-10-CM | POA: Diagnosis not present

## 2015-01-15 DIAGNOSIS — G629 Polyneuropathy, unspecified: Secondary | ICD-10-CM | POA: Diagnosis not present

## 2015-01-16 ENCOUNTER — Ambulatory Visit: Payer: Medicare Other | Admitting: Neurology

## 2015-01-16 ENCOUNTER — Telehealth: Payer: Self-pay

## 2015-01-16 ENCOUNTER — Telehealth: Payer: Self-pay | Admitting: Neurology

## 2015-01-16 DIAGNOSIS — I480 Paroxysmal atrial fibrillation: Secondary | ICD-10-CM | POA: Diagnosis not present

## 2015-01-16 DIAGNOSIS — Z471 Aftercare following joint replacement surgery: Secondary | ICD-10-CM | POA: Diagnosis not present

## 2015-01-16 DIAGNOSIS — F419 Anxiety disorder, unspecified: Secondary | ICD-10-CM

## 2015-01-16 DIAGNOSIS — I129 Hypertensive chronic kidney disease with stage 1 through stage 4 chronic kidney disease, or unspecified chronic kidney disease: Secondary | ICD-10-CM | POA: Diagnosis not present

## 2015-01-16 DIAGNOSIS — M1990 Unspecified osteoarthritis, unspecified site: Secondary | ICD-10-CM | POA: Diagnosis not present

## 2015-01-16 DIAGNOSIS — G629 Polyneuropathy, unspecified: Secondary | ICD-10-CM | POA: Diagnosis not present

## 2015-01-16 DIAGNOSIS — G4752 REM sleep behavior disorder: Secondary | ICD-10-CM

## 2015-01-16 DIAGNOSIS — G20 Parkinson's disease: Secondary | ICD-10-CM

## 2015-01-16 NOTE — Telephone Encounter (Signed)
I called the pharmacy.  Spoke with pharmacist who verified there is a refill saved on the patient's file.  They will fill this today and notify patient when Rx is ready for pick up.  I called the patient back to advise.  Got no answer.  Left message.

## 2015-01-16 NOTE — Telephone Encounter (Signed)
Wife [name] [phone] called regarding clonazePAM (KLONOPIN) 0.5 MG disintegrating tablet. Patient is currently out of medication. Pharmacy sent over request, hasn't heard back from our office.

## 2015-01-16 NOTE — Telephone Encounter (Signed)
Dr. Rexene Alberts is out of office due to emergency. I was able to contact patient and reschedule for this Thursday.

## 2015-01-17 ENCOUNTER — Ambulatory Visit (INDEPENDENT_AMBULATORY_CARE_PROVIDER_SITE_OTHER): Payer: Medicare Other | Admitting: Internal Medicine

## 2015-01-17 DIAGNOSIS — R600 Localized edema: Secondary | ICD-10-CM | POA: Diagnosis not present

## 2015-01-17 DIAGNOSIS — Z471 Aftercare following joint replacement surgery: Secondary | ICD-10-CM | POA: Diagnosis not present

## 2015-01-17 DIAGNOSIS — I480 Paroxysmal atrial fibrillation: Secondary | ICD-10-CM | POA: Diagnosis not present

## 2015-01-17 DIAGNOSIS — Z5181 Encounter for therapeutic drug level monitoring: Secondary | ICD-10-CM

## 2015-01-17 DIAGNOSIS — M1990 Unspecified osteoarthritis, unspecified site: Secondary | ICD-10-CM | POA: Diagnosis not present

## 2015-01-17 DIAGNOSIS — G629 Polyneuropathy, unspecified: Secondary | ICD-10-CM | POA: Diagnosis not present

## 2015-01-17 DIAGNOSIS — G20 Parkinson's disease: Secondary | ICD-10-CM | POA: Diagnosis not present

## 2015-01-17 DIAGNOSIS — I129 Hypertensive chronic kidney disease with stage 1 through stage 4 chronic kidney disease, or unspecified chronic kidney disease: Secondary | ICD-10-CM | POA: Diagnosis not present

## 2015-01-17 DIAGNOSIS — I872 Venous insufficiency (chronic) (peripheral): Secondary | ICD-10-CM | POA: Diagnosis not present

## 2015-01-17 LAB — POCT INR: INR: 1.7

## 2015-01-18 ENCOUNTER — Ambulatory Visit (INDEPENDENT_AMBULATORY_CARE_PROVIDER_SITE_OTHER): Payer: Medicare Other | Admitting: Neurology

## 2015-01-18 ENCOUNTER — Encounter: Payer: Self-pay | Admitting: Neurology

## 2015-01-18 VITALS — BP 118/68 | HR 76 | Resp 18 | Ht 72.0 in | Wt 240.0 lb

## 2015-01-18 DIAGNOSIS — G4752 REM sleep behavior disorder: Secondary | ICD-10-CM | POA: Diagnosis not present

## 2015-01-18 DIAGNOSIS — G20 Parkinson's disease: Secondary | ICD-10-CM

## 2015-01-18 DIAGNOSIS — I482 Chronic atrial fibrillation, unspecified: Secondary | ICD-10-CM

## 2015-01-18 DIAGNOSIS — Z96652 Presence of left artificial knee joint: Secondary | ICD-10-CM

## 2015-01-18 NOTE — Progress Notes (Signed)
Subjective:    Patient ID: Brian [name] is a 74 y.o. male.  HPI     Interim history:   Brian [name] is a 74 year old right-handed gentleman with an underlying medical history of atrial fibrillation, hypertension, OSA, and hyperlipidemia who presents for followup consultation of his right-sided predominant Parkinson's disease complicated by bladder hyperactivity, anxiety and RBD. He is accompanied by his wife's sister Brian [name]) today. He missed an appointment on 12/19/2014. I last saw him on 08/22/2014, at which time he reported bilateral knee pain. He had seen Dr. Tonita Cong for his knee arthritis and had undergone injections into both knees. He did not think the injections helped a lot. He was exploring knee replacement surgery. He was having more difficulty getting out of chairs. He had no freezing. He did think that the increase in Sinemet had helped in the past. He was alternating 1 pill with 1-1/2 pills. He was taking Sinemet CR at night. He was on gabapentin 100 mg 3 times a day and Lexapro 5 mg daily as well as clonazepam 0.5 mg each night. He had no new memory or mood issues. He was overall moving slower. He had no recent falls.  In the interim, his wife called on 01/04/2015 reported that he had worsening symptoms. I suggested we increase his Sinemet slightly to 2 tablets alternating with 1-1/2 tablets.  Today, 01/18/2015: He reports doing better slowly. He finished home health physical therapy and is about to start outpatient physical therapy through his orthopedic surgeon. He had left total knee arthroplasty on 11/30/2014. I reviewed the operative note as well as his discharge summary. He was discharged on 12/03/2014 to inpatient rehabilitation. Of note, he presented to the emergency room on 12/16/2014 secondary to repeated vomiting. This was deemed secondary to constipation. Cardiac enzymes were tested which were negative for any acute coronary syndrome. I reviewed the emergency room records. He had  blood work which I reviewed. INR was 2.59. Hemoglobin was slightly low at 11.9 and hematocrit was 34.6. Troponin was negative 2. He reports overall doing a little better since we increased his Sinemet. He takes a long-acting at night. He does report some more off time in the middle of the night, typically between 2 and 3 AM. Otherwise, he feels that he is making slow progress. He had some issues with lower extremity swelling. He is wearing compression stockings for this. He was on furosemide for a little while. He is no longer on narcotic pain medications. He uses a cane but feels that he can walk without a cane. His next follow-up with his orthopedic surgeon is in 5 weeks from now.  Previously:   I saw him on 06/01/2014, at which time he reported overall doing well. He was not able to sleep through the night. He was consistently waking up between 3 and 4 AM. He had nocturia twice on average. He felt improved from the bladder medication but still reported disrupted sleep. He felt clonazepam has helped. His wife agreed. He also felt that the increase in Sinemet was helpful in his motor function. It was difficult for him to keep up with the 5 pills a day schedule. He was going to the [facility] 3 times a week for about 30 minutes. Lexapro has helped his mood. His wife felt that he was less anxious. I suggested we increase his clonazepam to 0.5 mg each night. I suggested we change his Sinemet to 1 pill alternating with one half pills for a total of 5 pills  daily but for doses. I suggested he continue with Sinemet CR at bedtime.  I saw him on 03/27/2014, at which time he reported sleeping a little bit better but his tremor was worse. He did not feel Sinemet was lasting him 4 hours in between 2 different doses. He was going to the gym twice a week. He was back on an antidepressant. He was supposed to start a new bladder medication. He never actually started clonazepam for RBD. I prescribed clonazepam for him for RBD. I  increased his Sinemet to one fill 5 times a day.   I saw him on 12/01/2013, at which time he reported, that he stopped Myrbetric due to new onset facial swelling, but this was no better after stopping it. His facial swelling may have started after the lexapro. Adding long-acting Sinemet helped him sleep a little bit longer up to 3 AM. I suggested he stop Lexapro because of a possible allergic reaction to it. I also added low-dose clonazepam for his RBD and for his sleep.  I saw him on 09/29/2013 at which time he presented for a sooner than scheduled appointment because of increasing tremors and parkinsonian symptoms. He also felt more anxious but not frankly depressed. His wife felt that he may have been depressed. She reported dream enactments. She also reported loud snoring. He had a sleep study over 5 years ago, and that was negative for OSA at the time. He had started Myrbetriq recently. He has been taking gabapentin 3 times a day, and 7, 11 and 3. He takes Sinemet regularly at 7, 11, 3, and 7. I suggested that he start low-dose Lexapro at 5 mg strength. I changed the timing of his gabapentin to one pill at 7 AM, 1 pill at 11 AM and one pill at 7 PM. I asked him to continue with Sinemet 4 times a day. I asked him to add a long-acting Sinemet CR 50/200 mg strength once daily at bed time. I suggested he return for a sleep study. He had a diagnostic polysomnogram on 11/09/2013 and I went over his test results with him in detail today. Sleep efficiency was reduced at 58.7% with a latency to sleep of 82 minutes and wake after sleep onset of 117 minutes with mild to moderate sleep fragmentation noted. He had an increased percentage of light stage sleep, absence of deep sleep and 16.8% of dream sleep with a prolonged REM latency. He had mild to moderate snoring. Total AHI was 3.2 per hour, he had some lack of REM atonia.   I saw him on 05/31/2013, at which time I felt he was fairly stable. I kept him on Sinemet 4  times a day and gabapentin 100 mg 3 times a day. He reported, going up to 4 times a day with his Sinemet helped. He reported no side effects with gabapentin or Sinemet and seemed to tolerate them well. He had some residual facial tingling which was tolerable to him. He has LBP and went to [facility]. He had an Xray of the back and was told there was degenerative disease and was given and was given a 3 week taper of oral steroids, which helped. He had stopped exercising d/t back pain and was scheduled for PT evaluation through ortho. He has been on metamucil for constipation which helped.   I saw him on 11/30/2012, at which time I suggested that he continue gabapentin 100 mg 1 pill 3 times a day for his paresthesias and encouraged him  to continue Sinemet with 25/100 mg strength one tablet 4 times a day, at 7, 11, 3 PM and 7 PM.   I first met him on 06/22/2012 and he presented on 08/23/2012 for a sooner than scheduled appointment because he felt his medication was not helpful. He previously followed with Dr. Jeneen Rinks love and had been complaining of paresthesias. Dr. Erling Cruz had started him on low-dose gabapentin. For his parkinsonism which was first noted in June of 2012 he was tried on pramipexole which helped, but at his first visit with me he told me that his gabapentin was not helpful and he also discontinued pramipexole a few months prior because of swelling in his feet and ankles. This improved after stopping both medications. At the time of his first visit with me I felt he had mild parkinsonism without much in the way of lateralization. I started him on gabapentin again because of his paresthesias and also asked him to start low-dose Sinemet. He called back stating that the Sinemet was not helpful. He requested a sooner appointment. He had been continuing taking gabapentin 100 mg 3 times a day and Sinemet one pill 3 times a day with minimal improvement as he reported last time. I suggested at that visit that  he increase his Sinemet to one pill 4 times a day and continue with gabapentin 100 mg 3 times a day. I also felt that he may have right sided predominant idiopathic Parkinson's disease due to an intermittent tremor noted only on the right side.    His Past Medical History Is Significant For: Past Medical History  Diagnosis Date  . Dyslipidemia   . Mitral regurgitation     Mild  . Abnormal PFT 1. 05/18/08  2. 11/30/08    1. Showed mild airflow obstruction, mild restriction, mild diffusion defect; FEV1 2.22(64%), FVC 3.33(65%), FEVi% 67, TLC 5.19(69%), DLCO 77%, +BD  2. FEV1 2.38(73%), FVC 3.81(80%), FEV1% 63, TLC 5.61(80%), DLCO 79%, no BD  . Parkinsonism ([place]) 06/22/2012  . Paresthesias 06/22/2012  . Hyperlipidemia   . GERD (gastroesophageal reflux disease)   . Erectile dysfunction   . BPH (benign prostatic hyperplasia)   . Restrictive lung disease   . CKD (chronic kidney disease) stage 2, GFR 60-89 ml/min   . Cellulitis     right leg MRSA  . Osteoarthritis   . Allergic rhinitis   . Anxiety   . Hypertension   . Atrial fibrillation (HCC)     persistent afib  . Heart murmur     as a teenager     His Past Surgical History Is Significant For: Past Surgical History  Procedure Laterality Date  . Cataract extraction    . Cardioversion      multiple  . Cardiac catheterization  11/2009    normal coronary arteries  . Colonoscopy    . Total knee arthroplasty Left 11/30/2014    Procedure: TOTAL KNEE ARTHROPLASTY;  Surgeon: Susa Day, MD;  Location: [facility];  Service: Orthopedics;  Laterality: Left;    His Family History Is Significant For: Family History  Problem Relation Age of Onset  . Diabetes Mother     DM  . Stroke Father     CVA  . Pancreatic cancer Brother   . Pancreatic cancer Other     Nephew    His Social History Is Significant For: Social History   Social History  . Marital Status: Married    Spouse Name: [name]  . Number of Children: 4  .  Years of Education:  masters   Occupational History  . Benefit analyst     Worked with [employer]   Social History Main Topics  . Smoking status: Never Smoker   . Smokeless tobacco: Never Used  . Alcohol Use: No     Comment: occasionally  . Drug Use: No  . Sexual Activity: Not Asked   Other Topics Concern  . None   Social History Narrative   Married and lives in [place].  Retired Optometrist.   No reported caffeine use.     His Allergies Are:  Allergies  Allergen Reactions  . Lisinopril Other (See Comments)    coughing  :   His Current Medications Are:  Outpatient Encounter Prescriptions as of 01/18/2015  Medication Sig  . BYSTOLIC 10 MG tablet Take 10 mg by mouth daily.   . carbidopa-levodopa (SINEMET CR) 50-200 MG per tablet Take 1 tablet by mouth at bedtime.  . carbidopa-levodopa (SINEMET IR) 25-100 MG tablet Take 2 tablets by mouth 2 (two) times daily. Take 2 tabs at 7am and 3pm. Take 1.5 tabs at 11am and 7pm.  . Cholecalciferol (VITAMIN D3) 1000 UNITS CAPS Take 1,000 Units by mouth daily.   . clonazePAM (KLONOPIN) 0.5 MG disintegrating tablet Take 1 tablet (0.5 mg total) by mouth at bedtime.  . docusate sodium (COLACE) 100 MG capsule Take 1 capsule (100 mg total) by mouth every 12 (twelve) hours.  Marland Kitchen doxazosin (CARDURA) 2 MG tablet TAKE THREE TABLETS BY MOUTH ONCE DAILY. (Patient taking differently: 1 tablet TID)  . escitalopram (LEXAPRO) 5 MG tablet Take 1 tablet (5 mg total) by mouth daily.  . famotidine (PEPCID) 40 MG tablet Take 40 mg by mouth daily.   . furosemide (LASIX) 20 MG tablet Take 20 mg by mouth 2 (two) times daily.   Marland Kitchen gabapentin (NEURONTIN) 100 MG capsule Take 1 capsule (100 mg total) by mouth 3 (three) times daily.  Marland Kitchen HYDROcodone-acetaminophen (NORCO/VICODIN) 5-325 MG per tablet Take 1-2 tablets by mouth every 4 (four) hours as needed for moderate pain.  Marland Kitchen ketoconazole (NIZORAL) 2 % cream Apply 2 application topically 2 (two) times a week.  Marland Kitchen KLOR-CON M10 10 MEQ  tablet Take 1 tablet by mouth daily.  . ondansetron (ZOFRAN ODT) 4 MG disintegrating tablet 6m ODT q6 hours prn nausea/vomit  . oxybutynin (DITROPAN) 5 MG tablet Take 5 mg by mouth daily.   . polyethylene glycol (MIRALAX / GLYCOLAX) packet Take 17 g by mouth daily.  . pravastatin (PRAVACHOL) 40 MG tablet Take 40 mg by mouth daily.   . sennosides-docusate sodium (SENOKOT-S) 8.6-50 MG tablet Take 2 tablets by mouth 2 (two) times daily.  . sodium chloride (OCEAN) 0.65 % SOLN nasal spray Place 2 sprays into both nostrils 4 (four) times daily as needed for congestion.  . traMADol (ULTRAM) 50 MG tablet Take 1-2 tablets (50-100 mg total) by mouth every 6 (six) hours as needed (mild pain).  .Marland Kitchenwarfarin (COUMADIN) 5 MG tablet Take as directed by  Coumadin clinic (Patient taking differently: Take 5 mg by mouth daily. Take as directed by  Coumadin clinic)   No facility-administered encounter medications on file as of 01/18/2015.  :  Review of Systems:  Out of a complete 14 point review of systems, all are reviewed and negative with the exception of these symptoms as listed below:   Review of Systems  Neurological: Positive for tremors.       Patient states that he get tremors at about 2-3am, feels  that his Sinemet "wears off" at that time.      Objective:  Neurologic Exam  Physical Exam Physical Examination:   Filed Vitals:   01/18/15 1534  BP: 118/68  Pulse: 76  Resp: 18   General Examination: The patient is a very pleasant 74 y.o. male in no acute distress. He appears well-developed and well-nourished and well groomed. He is overweight. He is in good spirits today.  HEENT: Normocephalic, atraumatic, pupils are equal, round and reactive to light and accommodation. Extraocular tracking shows mild saccadic breakdown without nystagmus noted. There is no limitation to gaze. There is no apraxia of eyelid opening. He has bilateral cataract repairs. There is mild decrease in eye blink rate. Hearing  is intact. Face is symmetric with moderate facial masking and normal facial sensation. There is an intermittent lip and head tremor. This is about the same. Neck is moderately rigid with intact passive ROM. There are no carotid bruits on auscultation. Oropharynx exam reveals no mouth dryness. No significant airway crowding is noted, tonsils are absent, but he does have a floppy appearing soft palate and his uvula reaches far down . Mallampati is class II. Tongue protrudes centrally and palate elevates symmetrically.    Chest: is clear to auscultation without wheezing, rhonchi or crackles noted.  Heart: sounds are irregularly irregular without murmurs, rubs or gallops noted.   Abdomen: is soft, non-tender and non-distended with normal bowel sounds appreciated on auscultation.  Extremities: There is 1+ edema in the ankles and above bilaterally, and he is wearing bilateral compression stockings to up to knees.     Skin: is warm and dry with no trophic changes noted. Chronic stasis like changes are seen in the distal legs bilateral.   Musculoskeletal: exam reveals no obvious joint deformities, tenderness or joint swelling or erythema, except for bilateral knee swelling, L>R and a fairly well healing and unremarkable knee surgery scar on his left knee.   Neurologically:   Mental status: The patient is awake and alert, paying good attention. He is able to to provide a good history. His wife provides some detail oriented to: person, place, time/date, situation, day of week, month of year and year. His memory, attention, language and knowledge are intact. There is no aphasia, agnosia, apraxia or anomia. There is a mild degree of bradyphrenia. Speech is mildly hypophonic with no dysarthria noted. Mood is congruent and affect is normal.  Cranial nerves are as described above under HEENT exam. In addition, shoulder shrug is normal with equal shoulder height noted.  Motor exam: Normal bulk, and strength for  age is noted. Tone is mild to moderately rigid with absence of cogwheeling in the bilateral extremities. Tone is increased on the R>L. There is an intermittent right upper extremity tremor at rest, unchanged. There is overall moderate bradykinesia. There is no drift or rebound. Romberg is negative. Reflexes are 1+ in the upper extremities and 1+ in the lower extremities. Fine motor skills exam reveals: Finger taps are moderately impaired on the right and mildly impaired on the left. Hand movements are mildly impaired on the right and mildly impaired on the left. RAP (rapid alternating patting) is mildly impaired on the right and mildly impaired on the left. Foot taps are moderately to severely impaired on the right and moderately impaired on the left. Foot agility (in the form of heel stomping) is mildly impaired on the right and mildly impaired on the left.    Cerebellar testing shows no dysmetria or intention tremor  on finger to nose testing. There is no truncal or gait ataxia.   Sensory exam is intact to light touch in the UEs and LEs.   Gait, station and balance exam: He stands up from the seated position with mild difficulty. No veering to one side is noted. He is not noted to lean to one side. Posture is moderately stooped he is slightly tilted to the left (better than last time?). Stance is  slightly wide based. He walks with decrease in stride length and pace and decreased arm swing on both sides, R nearly absent. He turns in 3 steps.  he uses a single-point cane but also can walk without it some. He turns slowly.   Assessment and Plan:   In summary, KIANO TERRIEN is a very pleasant 74 year old male with a history of atrial fibrillation, hypertension, hyperlipidemia, who presents for followup consultation of his right-sided predominant Parkinson's disease, most likely akinetic-rigid type, complicated by anxiety, mild depression, sleep disorder, including sleep fragmentation, nocturia, insomnia,  obesity, arthritis, and RBD. He is now Status post recent left knee replacement surgery. He is recovering well. He had an increase in his Sinemet to 2 pills alternating with 1-1/2 pills for 4 doses. In addition, he reports more off time in the middle of the night, most typically in the early morning hours. For this I suggested he could add an additional Sinemet somewhere between midnight and 3 AM if he wishes. He can try this. He would like to think about it first. We will continue all his other medications including gabapentin, Lexapro low dose, clonazepam at night, and we will monitor his symptoms. He did not require any refills today on his prescriptions. We will keep the Sinemet CR 50/200 mg at bedtime. If he indeed needs joint replacement surgery on the right side as well, he is advised that  Parkinson's disease patients can take longer to recover and can experience temporary worsening of her symptoms while recovering from anesthesia and the surgery. We will keep the gabapentin at 100 mg three times a day.  I answered all his questions today and the patient was in agreement. He is advised to follow up in about 3 months. I spent 25 minutes in total face-to-face time with the patient, more than 50% of which was spent in counseling and coordination of care, reviewing test results, reviewing medication and discussing or reviewing the diagnosis of PD, its prognosis and treatment options.

## 2015-01-18 NOTE — Telephone Encounter (Signed)
Error

## 2015-01-18 NOTE — Patient Instructions (Addendum)
For now, let's keep your medications the same, as is Sinemet 2 pills alternating with 1 1/2 pills for a total of 4 doses per day.  I addition, if you have more off time in the early morning hours, we can try an additional Sinemet once at night, between midnight and 3 AM. Think about trying this for a few days.

## 2015-01-19 DIAGNOSIS — G20 Parkinson's disease: Secondary | ICD-10-CM | POA: Diagnosis not present

## 2015-01-19 DIAGNOSIS — G629 Polyneuropathy, unspecified: Secondary | ICD-10-CM | POA: Diagnosis not present

## 2015-01-19 DIAGNOSIS — Z471 Aftercare following joint replacement surgery: Secondary | ICD-10-CM | POA: Diagnosis not present

## 2015-01-19 DIAGNOSIS — M1990 Unspecified osteoarthritis, unspecified site: Secondary | ICD-10-CM | POA: Diagnosis not present

## 2015-01-19 DIAGNOSIS — I129 Hypertensive chronic kidney disease with stage 1 through stage 4 chronic kidney disease, or unspecified chronic kidney disease: Secondary | ICD-10-CM | POA: Diagnosis not present

## 2015-01-19 DIAGNOSIS — I480 Paroxysmal atrial fibrillation: Secondary | ICD-10-CM | POA: Diagnosis not present

## 2015-01-22 DIAGNOSIS — M1712 Unilateral primary osteoarthritis, left knee: Secondary | ICD-10-CM | POA: Diagnosis not present

## 2015-01-24 ENCOUNTER — Encounter: Payer: Self-pay | Admitting: Podiatry

## 2015-01-24 ENCOUNTER — Ambulatory Visit (INDEPENDENT_AMBULATORY_CARE_PROVIDER_SITE_OTHER): Payer: Medicare Other | Admitting: Podiatry

## 2015-01-24 DIAGNOSIS — M79676 Pain in unspecified toe(s): Secondary | ICD-10-CM | POA: Diagnosis not present

## 2015-01-24 DIAGNOSIS — B351 Tinea unguium: Secondary | ICD-10-CM | POA: Diagnosis not present

## 2015-01-24 NOTE — Progress Notes (Signed)
Patient ID: Brian [name], male   DOB: 10/27/1940, 74 y.o.   MRN: [id number] Complaint:  Visit Type: Patient returns to my office for continued preventative foot care services. Complaint: Patient states" my nails have grown long and thick and become painful to walk and wear shoes" . The patient presents for preventative foot care services. No changes to ROS  Podiatric Exam: Vascular: dorsalis pedis and posterior tibial pulses are palpable bilateral. Capillary return is immediate. Temperature gradient is WNL. Skin turgor WNL  Sensorium: Normal Semmes Weinstein monofilament test. Normal tactile sensation bilaterally. Nail Exam: Pt has thick disfigured discolored nails with subungual debris noted bilateral entire nail hallux through fifth toenails Ulcer Exam: There is no evidence of ulcer or pre-ulcerative changes or infection. Orthopedic Exam: Muscle tone and strength are WNL. No limitations in general ROM. No crepitus or effusions noted. Foot type and digits show no abnormalities. Bony prominences are unremarkable. Skin: No Porokeratosis. No infection or ulcers  Diagnosis:  Onychomycosis, , Pain in right toe, pain in left toes  Treatment & Plan Procedures and Treatment: Consent by patient was obtained for treatment procedures. The patient understood the discussion of treatment and procedures well. All questions were answered thoroughly reviewed. Debridement of mycotic and hypertrophic toenails, 1 through 5 bilateral and clearing of subungual debris. No ulceration, no infection noted.  Return Visit-Office Procedure: Patient instructed to return to the office for a follow up visit 3 months for continued evaluation and treatment. 

## 2015-01-25 DIAGNOSIS — M1712 Unilateral primary osteoarthritis, left knee: Secondary | ICD-10-CM | POA: Diagnosis not present

## 2015-01-29 DIAGNOSIS — M1712 Unilateral primary osteoarthritis, left knee: Secondary | ICD-10-CM | POA: Diagnosis not present

## 2015-02-01 DIAGNOSIS — M1712 Unilateral primary osteoarthritis, left knee: Secondary | ICD-10-CM | POA: Diagnosis not present

## 2015-02-02 ENCOUNTER — Ambulatory Visit (INDEPENDENT_AMBULATORY_CARE_PROVIDER_SITE_OTHER): Payer: Medicare Other | Admitting: *Deleted

## 2015-02-02 DIAGNOSIS — I482 Chronic atrial fibrillation, unspecified: Secondary | ICD-10-CM

## 2015-02-02 DIAGNOSIS — Z5181 Encounter for therapeutic drug level monitoring: Secondary | ICD-10-CM

## 2015-02-02 DIAGNOSIS — I4891 Unspecified atrial fibrillation: Secondary | ICD-10-CM | POA: Diagnosis not present

## 2015-02-02 DIAGNOSIS — Z7901 Long term (current) use of anticoagulants: Secondary | ICD-10-CM

## 2015-02-02 LAB — POCT INR: INR: 2.5

## 2015-02-05 DIAGNOSIS — M1712 Unilateral primary osteoarthritis, left knee: Secondary | ICD-10-CM | POA: Diagnosis not present

## 2015-02-08 DIAGNOSIS — M1712 Unilateral primary osteoarthritis, left knee: Secondary | ICD-10-CM | POA: Diagnosis not present

## 2015-02-12 DIAGNOSIS — R350 Frequency of micturition: Secondary | ICD-10-CM | POA: Diagnosis not present

## 2015-02-13 DIAGNOSIS — M1712 Unilateral primary osteoarthritis, left knee: Secondary | ICD-10-CM | POA: Diagnosis not present

## 2015-02-19 ENCOUNTER — Ambulatory Visit (INDEPENDENT_AMBULATORY_CARE_PROVIDER_SITE_OTHER): Payer: Medicare Other | Admitting: *Deleted

## 2015-02-19 DIAGNOSIS — I4891 Unspecified atrial fibrillation: Secondary | ICD-10-CM

## 2015-02-19 DIAGNOSIS — I482 Chronic atrial fibrillation, unspecified: Secondary | ICD-10-CM

## 2015-02-19 DIAGNOSIS — Z7901 Long term (current) use of anticoagulants: Secondary | ICD-10-CM

## 2015-02-19 DIAGNOSIS — Z5181 Encounter for therapeutic drug level monitoring: Secondary | ICD-10-CM

## 2015-02-19 DIAGNOSIS — M1712 Unilateral primary osteoarthritis, left knee: Secondary | ICD-10-CM | POA: Diagnosis not present

## 2015-02-19 LAB — POCT INR: INR: 2.1

## 2015-02-21 DIAGNOSIS — M1712 Unilateral primary osteoarthritis, left knee: Secondary | ICD-10-CM | POA: Diagnosis not present

## 2015-02-22 DIAGNOSIS — Z96652 Presence of left artificial knee joint: Secondary | ICD-10-CM | POA: Diagnosis not present

## 2015-02-22 DIAGNOSIS — Z471 Aftercare following joint replacement surgery: Secondary | ICD-10-CM | POA: Diagnosis not present

## 2015-03-16 ENCOUNTER — Ambulatory Visit (INDEPENDENT_AMBULATORY_CARE_PROVIDER_SITE_OTHER): Payer: Medicare Other | Admitting: *Deleted

## 2015-03-16 DIAGNOSIS — I4891 Unspecified atrial fibrillation: Secondary | ICD-10-CM | POA: Diagnosis not present

## 2015-03-16 DIAGNOSIS — I482 Chronic atrial fibrillation, unspecified: Secondary | ICD-10-CM

## 2015-03-16 DIAGNOSIS — Z5181 Encounter for therapeutic drug level monitoring: Secondary | ICD-10-CM

## 2015-03-16 DIAGNOSIS — Z7901 Long term (current) use of anticoagulants: Secondary | ICD-10-CM

## 2015-03-16 LAB — POCT INR: INR: 2.2

## 2015-03-29 DIAGNOSIS — R350 Frequency of micturition: Secondary | ICD-10-CM | POA: Diagnosis not present

## 2015-04-05 DIAGNOSIS — R350 Frequency of micturition: Secondary | ICD-10-CM | POA: Diagnosis not present

## 2015-04-12 DIAGNOSIS — R350 Frequency of micturition: Secondary | ICD-10-CM | POA: Diagnosis not present

## 2015-04-13 ENCOUNTER — Telehealth: Payer: Self-pay | Admitting: Neurology

## 2015-04-14 ENCOUNTER — Other Ambulatory Visit: Payer: Self-pay | Admitting: Neurology

## 2015-04-16 ENCOUNTER — Other Ambulatory Visit: Payer: Self-pay | Admitting: Neurology

## 2015-04-17 ENCOUNTER — Other Ambulatory Visit: Payer: Self-pay

## 2015-04-17 NOTE — Telephone Encounter (Signed)
Called pharmacy and called in Rx. I also notified wife that we have done this.

## 2015-04-17 NOTE — Telephone Encounter (Signed)
Pt called said he has called [facility]  twice and went by to pick up RX once and is being told RX has not been called to them from providers office. Please call pt to let him know status

## 2015-04-18 ENCOUNTER — Encounter: Payer: Self-pay | Admitting: Podiatry

## 2015-04-18 ENCOUNTER — Ambulatory Visit (INDEPENDENT_AMBULATORY_CARE_PROVIDER_SITE_OTHER): Payer: Medicare Other | Admitting: Podiatry

## 2015-04-18 DIAGNOSIS — M79676 Pain in unspecified toe(s): Secondary | ICD-10-CM

## 2015-04-18 DIAGNOSIS — B351 Tinea unguium: Secondary | ICD-10-CM

## 2015-04-18 NOTE — Progress Notes (Signed)
Patient ID: Brian [name], male   DOB: May 30, 1940, 75 y.o.   MRN: [id number] Complaint:  Visit Type: Patient returns to my office for continued preventative foot care services. Complaint: Patient states" my nails have grown long and thick and become painful to walk and wear shoes" . The patient presents for preventative foot care services. No changes to ROS  Podiatric Exam: Vascular: dorsalis pedis and posterior tibial pulses are palpable bilateral. Capillary return is immediate. Temperature gradient is WNL. Skin turgor WNL  Sensorium: Normal Semmes Weinstein monofilament test. Normal tactile sensation bilaterally. Nail Exam: Pt has thick disfigured discolored nails with subungual debris noted bilateral entire nail hallux through fifth toenails Ulcer Exam: There is no evidence of ulcer or pre-ulcerative changes or infection. Orthopedic Exam: Muscle tone and strength are WNL. No limitations in general ROM. No crepitus or effusions noted. Foot type and digits show no abnormalities. Bony prominences are unremarkable. Skin: No Porokeratosis. No infection or ulcers  Diagnosis:  Onychomycosis, , Pain in right toe, pain in left toes  Treatment & Plan Procedures and Treatment: Consent by patient was obtained for treatment procedures. The patient understood the discussion of treatment and procedures well. All questions were answered thoroughly reviewed. Debridement of mycotic and hypertrophic toenails, 1 through 5 bilateral and clearing of subungual debris. No ulceration, no infection noted.  Return Visit-Office Procedure: Patient instructed to return to the office for a follow up visit 3 months for continued evaluation and treatment.   Gardiner Barefoot DPM

## 2015-04-19 DIAGNOSIS — H40013 Open angle with borderline findings, low risk, bilateral: Secondary | ICD-10-CM | POA: Diagnosis not present

## 2015-04-19 DIAGNOSIS — Z961 Presence of intraocular lens: Secondary | ICD-10-CM | POA: Diagnosis not present

## 2015-04-19 DIAGNOSIS — R350 Frequency of micturition: Secondary | ICD-10-CM | POA: Diagnosis not present

## 2015-04-20 ENCOUNTER — Ambulatory Visit (INDEPENDENT_AMBULATORY_CARE_PROVIDER_SITE_OTHER): Payer: Medicare Other

## 2015-04-20 DIAGNOSIS — Z5181 Encounter for therapeutic drug level monitoring: Secondary | ICD-10-CM | POA: Diagnosis not present

## 2015-04-20 DIAGNOSIS — I4891 Unspecified atrial fibrillation: Secondary | ICD-10-CM | POA: Diagnosis not present

## 2015-04-20 DIAGNOSIS — Z7901 Long term (current) use of anticoagulants: Secondary | ICD-10-CM | POA: Diagnosis not present

## 2015-04-20 DIAGNOSIS — I482 Chronic atrial fibrillation, unspecified: Secondary | ICD-10-CM

## 2015-04-20 LAB — POCT INR: INR: 2

## 2015-04-24 DIAGNOSIS — E782 Mixed hyperlipidemia: Secondary | ICD-10-CM | POA: Diagnosis not present

## 2015-04-24 DIAGNOSIS — G20 Parkinson's disease: Secondary | ICD-10-CM | POA: Diagnosis not present

## 2015-04-24 DIAGNOSIS — N182 Chronic kidney disease, stage 2 (mild): Secondary | ICD-10-CM | POA: Diagnosis not present

## 2015-04-24 DIAGNOSIS — J309 Allergic rhinitis, unspecified: Secondary | ICD-10-CM | POA: Diagnosis not present

## 2015-04-24 DIAGNOSIS — Z1389 Encounter for screening for other disorder: Secondary | ICD-10-CM | POA: Diagnosis not present

## 2015-04-24 DIAGNOSIS — I482 Chronic atrial fibrillation: Secondary | ICD-10-CM | POA: Diagnosis not present

## 2015-04-24 DIAGNOSIS — F419 Anxiety disorder, unspecified: Secondary | ICD-10-CM | POA: Diagnosis not present

## 2015-04-24 DIAGNOSIS — N400 Enlarged prostate without lower urinary tract symptoms: Secondary | ICD-10-CM | POA: Diagnosis not present

## 2015-04-24 DIAGNOSIS — K219 Gastro-esophageal reflux disease without esophagitis: Secondary | ICD-10-CM | POA: Diagnosis not present

## 2015-04-24 DIAGNOSIS — I10 Essential (primary) hypertension: Secondary | ICD-10-CM | POA: Diagnosis not present

## 2015-04-24 DIAGNOSIS — Z0000 Encounter for general adult medical examination without abnormal findings: Secondary | ICD-10-CM | POA: Diagnosis not present

## 2015-04-26 ENCOUNTER — Telehealth: Payer: Self-pay | Admitting: Neurology

## 2015-04-26 ENCOUNTER — Encounter: Payer: Self-pay | Admitting: Neurology

## 2015-04-26 ENCOUNTER — Ambulatory Visit (INDEPENDENT_AMBULATORY_CARE_PROVIDER_SITE_OTHER): Payer: Medicare Other | Admitting: Neurology

## 2015-04-26 VITALS — BP 129/78 | HR 75 | Resp 16 | Ht 74.0 in | Wt 244.0 lb

## 2015-04-26 DIAGNOSIS — G20 Parkinson's disease: Secondary | ICD-10-CM

## 2015-04-26 DIAGNOSIS — I482 Chronic atrial fibrillation, unspecified: Secondary | ICD-10-CM

## 2015-04-26 DIAGNOSIS — G4752 REM sleep behavior disorder: Secondary | ICD-10-CM

## 2015-04-26 DIAGNOSIS — F419 Anxiety disorder, unspecified: Secondary | ICD-10-CM | POA: Diagnosis not present

## 2015-04-26 DIAGNOSIS — Z96652 Presence of left artificial knee joint: Secondary | ICD-10-CM

## 2015-04-26 DIAGNOSIS — R350 Frequency of micturition: Secondary | ICD-10-CM | POA: Diagnosis not present

## 2015-04-26 MED ORDER — CARBIDOPA-LEVODOPA 25-100 MG PO TABS: 2.0000 | ORAL_TABLET | Freq: Two times a day (BID) | ORAL | Status: DC

## 2015-04-26 MED ORDER — CARBIDOPA-LEVODOPA ER 50-200 MG PO TBCR: 1.0000 | EXTENDED_RELEASE_TABLET | Freq: Every day | ORAL | Status: DC

## 2015-04-26 MED ORDER — ESCITALOPRAM OXALATE 5 MG PO TABS: 5.0000 mg | ORAL_TABLET | Freq: Every day | ORAL | Status: DC

## 2015-04-26 MED ORDER — CLONAZEPAM 0.5 MG PO TBDP: 0.5000 mg | ORAL_TABLET | Freq: Every day | ORAL | Status: DC

## 2015-04-26 NOTE — Progress Notes (Signed)
Subjective:    Patient ID: Brian [name] is a 75 y.o. male.  HPI     Interim history:   Brian [name] is a 75 year old right-handed gentleman with an underlying medical history of atrial fibrillation, hypertension, OSA, and hyperlipidemia who presents for followup consultation of his right-sided predominant Parkinson's disease complicated by bladder hyperactivity, anxiety and RBD. He is unaccompanied today. I last saw him on 01/18/2015, at which time he reported doing better slowly. He had finished home health physical therapy. He was supposed to start outpatient physical therapy through orthopedics. He had left total knee arthroplasty on 11/30/2014. I reviewed the operative note as well as his discharge summary. He was discharged on 12/03/2014 to inpatient rehabilitation. Of note, he presented to the emergency room on 12/16/2014 secondary to repeated vomiting. This was deemed secondary to constipation. Cardiac enzymes were tested which were negative for any acute coronary syndrome. I reviewed the emergency room records. He had blood work which I reviewed. INR was 2.59. Hemoglobin was slightly low at 11.9 and hematocrit was 34.6. Troponin was negative 2. He overall felt a little better since we increased his Sinemet. He was taking a long-acting Sinemet CR at night. He had some off time in the middle of the night.  He felt he was making slow progress after his knee replacement surgery. He had some issues with edema. He was wearing compression stockings for this. He was also placed on furosemide for a little while. He was no longer on narcotic pain medications. He was using a cane at the time. I suggested we continue with his medication regimen, he was alternating Sinemet 2 pills with 1-1/2 pills for a total of 4 doses a day.  Today, 04/26/2015: He reports taking a total of 7 pills of C/L daily, 2 for the 1st dose, 1 1/2 pills for the 2nd and 4 th dose, 2 for the 3rd dose (he is not 100% sure, will call  later to verify). 1 CR at night. He has finished outpt PT, walks without a cane, has appt with Dr. Tonita Cong later this month, hopefully to be released for the L knee, but still has right knee pain. Motor-wise, he feels stable. He has not had any recent falls. He denies any significant depression or sleep problems. He tries to drink enough water.    Previously:    He missed an appointment on 12/19/2014. I saw him on 08/22/2014, at which time he reported bilateral knee pain. He had seen Dr. Tonita Cong for his knee arthritis and had undergone injections into both knees. He did not think the injections helped a lot. He was exploring knee replacement surgery. He was having more difficulty getting out of chairs. He had no freezing. He did think that the increase in Sinemet had helped in the past. He was alternating 1 pill with 1-1/2 pills. He was taking Sinemet CR at night. He was on gabapentin 100 mg 3 times a day and Lexapro 5 mg daily as well as clonazepam 0.5 mg each night. He had no new memory or mood issues. He was overall moving slower. He had no recent falls.  In the interim, his wife called on 01/04/2015 reported that he had worsening symptoms. I suggested we increase his Sinemet slightly to 2 tablets alternating with 1-1/2 tablets.  I saw him on 06/01/2014, at which time he reported overall doing well. He was not able to sleep through the night. He was consistently waking up between 3 and 4 AM. He  had nocturia twice on average. He felt improved from the bladder medication but still reported disrupted sleep. He felt clonazepam has helped. His wife agreed. He also felt that the increase in Sinemet was helpful in his motor function. It was difficult for him to keep up with the 5 pills a day schedule. He was going to the [facility] 3 times a week for about 30 minutes. Lexapro has helped his mood. His wife felt that he was less anxious. I suggested we increase his clonazepam to 0.5 mg each night. I suggested we change  his Sinemet to 1 pill alternating with one half pills for a total of 5 pills daily but for doses. I suggested he continue with Sinemet CR at bedtime.  I saw him on 03/27/2014, at which time he reported sleeping a little bit better but his tremor was worse. He did not feel Sinemet was lasting him 4 hours in between 2 different doses. He was going to the gym twice a week. He was back on an antidepressant. He was supposed to start a new bladder medication. He never actually started clonazepam for RBD. I prescribed clonazepam for him for RBD. I increased his Sinemet to one fill 5 times a day.   I saw him on 12/01/2013, at which time he reported, that he stopped Myrbetric due to new onset facial swelling, but this was no better after stopping it. His facial swelling may have started after the lexapro. Adding long-acting Sinemet helped him sleep a little bit longer up to 3 AM. I suggested he stop Lexapro because of a possible allergic reaction to it. I also added low-dose clonazepam for his RBD and for his sleep.  I saw him on 09/29/2013 at which time he presented for a sooner than scheduled appointment because of increasing tremors and parkinsonian symptoms. He also felt more anxious but not frankly depressed. His wife felt that he may have been depressed. She reported dream enactments. She also reported loud snoring. He had a sleep study over 5 years ago, and that was negative for OSA at the time. He had started Myrbetriq recently. He has been taking gabapentin 3 times a day, and 7, 11 and 3. He takes Sinemet regularly at 7, 11, 3, and 7. I suggested that he start low-dose Lexapro at 5 mg strength. I changed the timing of his gabapentin to one pill at 7 AM, 1 pill at 11 AM and one pill at 7 PM. I asked him to continue with Sinemet 4 times a day. I asked him to add a long-acting Sinemet CR 50/200 mg strength once daily at bed time. I suggested he return for a sleep study. He had a diagnostic polysomnogram on  11/09/2013 and I went over his test results with him in detail today. Sleep efficiency was reduced at 58.7% with a latency to sleep of 82 minutes and wake after sleep onset of 117 minutes with mild to moderate sleep fragmentation noted. He had an increased percentage of light stage sleep, absence of deep sleep and 16.8% of dream sleep with a prolonged REM latency. He had mild to moderate snoring. Total AHI was 3.2 per hour, he had some lack of REM atonia.   I saw him on 05/31/2013, at which time I felt he was fairly stable. I kept him on Sinemet 4 times a day and gabapentin 100 mg 3 times a day. He reported, going up to 4 times a day with his Sinemet helped. He reported no side effects  with gabapentin or Sinemet and seemed to tolerate them well. He had some residual facial tingling which was tolerable to him. He has LBP and went to [facility]. He had an Xray of the back and was told there was degenerative disease and was given and was given a 3 week taper of oral steroids, which helped. He had stopped exercising d/t back pain and was scheduled for PT evaluation through ortho. He has been on metamucil for constipation which helped.   I saw him on 11/30/2012, at which time I suggested that he continue gabapentin 100 mg 1 pill 3 times a day for his paresthesias and encouraged him to continue Sinemet with 25/100 mg strength one tablet 4 times a day, at 7, 11, 3 PM and 7 PM.   I first met him on 06/22/2012 and he presented on 08/23/2012 for a sooner than scheduled appointment because he felt his medication was not helpful. He previously followed with Dr. Jeneen Rinks love and had been complaining of paresthesias. Dr. Erling Cruz had started him on low-dose gabapentin. For his parkinsonism which was first noted in June of 2012 he was tried on pramipexole which helped, but at his first visit with me he told me that his gabapentin was not helpful and he also discontinued pramipexole a few months prior because of swelling in his  feet and ankles. This improved after stopping both medications. At the time of his first visit with me I felt he had mild parkinsonism without much in the way of lateralization. I started him on gabapentin again because of his paresthesias and also asked him to start low-dose Sinemet. He called back stating that the Sinemet was not helpful. He requested a sooner appointment. He had been continuing taking gabapentin 100 mg 3 times a day and Sinemet one pill 3 times a day with minimal improvement as he reported last time. I suggested at that visit that he increase his Sinemet to one pill 4 times a day and continue with gabapentin 100 mg 3 times a day. I also felt that he may have right sided predominant idiopathic Parkinson's disease due to an intermittent tremor noted only on the right side.    His Past Medical History Is Significant For: Past Medical History  Diagnosis Date  . Dyslipidemia   . Mitral regurgitation     Mild  . Abnormal PFT 1. 05/18/08  2. 11/30/08    1. Showed mild airflow obstruction, mild restriction, mild diffusion defect; FEV1 2.22(64%), FVC 3.33(65%), FEVi% 67, TLC 5.19(69%), DLCO 77%, +BD  2. FEV1 2.38(73%), FVC 3.81(80%), FEV1% 63, TLC 5.61(80%), DLCO 79%, no BD  . Parkinsonism ([place]) 06/22/2012  . Paresthesias 06/22/2012  . Hyperlipidemia   . GERD (gastroesophageal reflux disease)   . Erectile dysfunction   . BPH (benign prostatic hyperplasia)   . Restrictive lung disease   . CKD (chronic kidney disease) stage 2, GFR 60-89 ml/min   . Cellulitis     right leg MRSA  . Osteoarthritis   . Allergic rhinitis   . Anxiety   . Hypertension   . Atrial fibrillation (HCC)     persistent afib  . Heart murmur     as a teenager     His Past Surgical History Is Significant For: Past Surgical History  Procedure Laterality Date  . Cataract extraction    . Cardioversion      multiple  . Cardiac catheterization  11/2009    normal coronary arteries  . Colonoscopy    .  Total knee  arthroplasty Left 11/30/2014    Procedure: TOTAL KNEE ARTHROPLASTY;  Surgeon: Susa Day, MD;  Location: [facility];  Service: Orthopedics;  Laterality: Left;    His Family History Is Significant For: Family History  Problem Relation Age of Onset  . Diabetes Mother     DM  . Stroke Father     CVA  . Pancreatic cancer Brother   . Pancreatic cancer Other     Nephew    His Social History Is Significant For: Social History   Social History  . Marital Status: Married    Spouse Name: [name]  . Number of Children: 4  . Years of Education: masters   Occupational History  . Benefit analyst     Worked with [employer]   Social History Main Topics  . Smoking status: Never Smoker   . Smokeless tobacco: Never Used  . Alcohol Use: No     Comment: occasionally  . Drug Use: No  . Sexual Activity: Not Asked   Other Topics Concern  . None   Social History Narrative   Married and lives in [place].  Retired Optometrist.   No reported caffeine use.     His Allergies Are:  Allergies  Allergen Reactions  . Lisinopril Other (See Comments)    coughing  :   His Current Medications Are:  Outpatient Encounter Prescriptions as of 04/26/2015  Medication Sig  . BYSTOLIC 10 MG tablet Take 10 mg by mouth daily.   . carbidopa-levodopa (SINEMET CR) 50-200 MG per tablet Take 1 tablet by mouth at bedtime.  . carbidopa-levodopa (SINEMET IR) 25-100 MG tablet Take 2 tablets by mouth 2 (two) times daily. Take 2 tabs at 7am and 3pm. Take 1.5 tabs at 11am and 7pm.  . Cholecalciferol (VITAMIN D3) 1000 UNITS CAPS Take 1,000 Units by mouth daily.   . clonazePAM (KLONOPIN) 0.5 MG disintegrating tablet DISSOLVE ONE TABLET IN MOUTH AT BEDTIME  . docusate sodium (COLACE) 100 MG capsule Take 1 capsule (100 mg total) by mouth every 12 (twelve) hours.  Marland Kitchen doxazosin (CARDURA) 2 MG tablet TAKE THREE TABLETS BY MOUTH ONCE DAILY. (Patient taking differently: 1 tablet TID)  . escitalopram (LEXAPRO) 5 MG tablet  Take 1 tablet (5 mg total) by mouth daily.  . famotidine (PEPCID) 40 MG tablet Take 40 mg by mouth daily.   . furosemide (LASIX) 20 MG tablet Take 20 mg by mouth 2 (two) times daily.   Marland Kitchen gabapentin (NEURONTIN) 100 MG capsule Take 1 capsule (100 mg total) by mouth 3 (three) times daily.  Marland Kitchen HYDROcodone-acetaminophen (NORCO/VICODIN) 5-325 MG per tablet Take 1-2 tablets by mouth every 4 (four) hours as needed for moderate pain.  Marland Kitchen ketoconazole (NIZORAL) 2 % cream Apply 2 application topically 2 (two) times a week.  Marland Kitchen KLOR-CON M10 10 MEQ tablet Take 1 tablet by mouth daily.  . ondansetron (ZOFRAN ODT) 4 MG disintegrating tablet 12m ODT q6 hours prn nausea/vomit  . oxybutynin (DITROPAN) 5 MG tablet Take 5 mg by mouth daily.   . polyethylene glycol (MIRALAX / GLYCOLAX) packet Take 17 g by mouth daily.  . pravastatin (PRAVACHOL) 40 MG tablet Take 40 mg by mouth daily.   . sennosides-docusate sodium (SENOKOT-S) 8.6-50 MG tablet Take 2 tablets by mouth 2 (two) times daily.  . sodium chloride (OCEAN) 0.65 % SOLN nasal spray Place 2 sprays into both nostrils 4 (four) times daily as needed for congestion.  . traMADol (ULTRAM) 50 MG tablet Take 1-2 tablets (50-100  mg total) by mouth every 6 (six) hours as needed (mild pain).  Marland Kitchen warfarin (COUMADIN) 5 MG tablet Take as directed by  Coumadin clinic (Patient taking differently: Take 5 mg by mouth daily. Take as directed by  Coumadin clinic)   No facility-administered encounter medications on file as of 04/26/2015.  :  Review of Systems:  Out of a complete 14 point review of systems, all are reviewed and negative with the exception of these symptoms as listed below:   Review of Systems  Neurological:       Patient is here for f/u. States he is taking a total of 6 tablets of Sinemet 25-100 during the day, and Sinemet CR at night. No new concerns.     Objective:  Neurologic Exam  Physical Exam Physical Examination:   Filed Vitals:   04/26/15 1036  BP:  129/78  Pulse: 75  Resp: 16   General Examination: The patient is a very pleasant 75 y.o. male in no acute distress. He appears well-developed and well-nourished and well groomed. He is overweight. He is in good spirits today.  HEENT: Normocephalic, atraumatic, pupils are equal, round and reactive to light and accommodation. Extraocular tracking shows mild saccadic breakdown without nystagmus noted. There is no limitation to gaze. There is no apraxia of eyelid opening. He has bilateral cataract repairs. There is moderate decrease in eye blink rate. Hearing is intact. Face is symmetric with moderate facial masking and normal facial sensation. There is an intermittent lip and head tremor. This is about the same. Neck is moderately rigid with intact passive ROM. There are no carotid bruits on auscultation. Oropharynx exam reveals no mouth dryness. No significant airway crowding is noted, tonsils are absent, but he does have a floppy appearing soft palate and his uvula reaches far down. Mallampati is class II. Tongue protrudes centrally and palate elevates symmetrically.    Chest: is clear to auscultation without wheezing, rhonchi or crackles noted.  Heart: sounds are irregularly irregular without murmurs, rubs or gallops noted.   Abdomen: is soft, non-tender and non-distended with normal bowel sounds appreciated on auscultation.  Extremities: There is 1+ edema in the ankles and above bilaterally, and he not wearing his  compression stockings to up to knees. There are chronic appearing discolorations in the distal legs.  Skin: is warm and dry with no trophic changes noted. Chronic stasis like changes are seen in the distal legs bilateral.   Musculoskeletal: exam reveals no obvious joint deformities, tenderness or joint swelling or erythema, except for bilateral knee swelling, right side more than left today, and a fairly well healing and unremarkable knee surgery scar on his left knee.    Neurologically:   Mental status: The patient is awake and alert, paying good attention. He is able to to provide a good history. His wife provides some detail oriented to: person, place, time/date, situation, day of week, month of year and year. His memory, attention, language and knowledge are intact. There is no aphasia, agnosia, apraxia or anomia. There is a mild degree of bradyphrenia. Speech is mildly hypophonic with no dysarthria noted. Mood is congruent and affect is normal.  Cranial nerves are as described above under HEENT exam. In addition, shoulder shrug is normal with equal shoulder height noted.  Motor exam: Normal bulk, and strength for age is noted. Tone is mild to moderately rigid with absence of cogwheeling in the bilateral extremities. Tone is increased on the R>L. There is an intermittent right upper extremity tremor  at rest, unchanged. There is overall moderate bradykinesia. There is no drift or rebound. Romberg is negative. Reflexes are 1+ in the upper extremities and 1+ in the lower extremities. Fine motor skills exam reveals: Finger taps are moderately b/l, right more than left, hand movements are mild to moderately impaired, right more pronounced. RAP (rapid alternating patting) is mildly impaired on the right and mildly impaired on the left. Foot taps are moderately to severely impaired on the right and moderately impaired on the left. Foot agility (in the form of heel stomping) is mildly impaired on the right and mildly impaired on the left.    Cerebellar testing shows no dysmetria or intention tremor on finger to nose testing. There is no truncal or gait ataxia.   Sensory exam is intact to light touch in the UEs and LEs.   Gait, station and balance exam: He stands up from the seated position with mild difficulty. No veering to one side is noted. He is not noted to lean to one side. Posture is moderately stooped he is slightly tilted to the left. Stance is  slightly wide  based. He walks with decrease in stride length and pace and decreased arm swing on both sides, R nearly absent. He turns in 3 steps.   Assessment and Plan:   In summary, Brian [name] is a very pleasant 75 year old male with a history of atrial fibrillation, hypertension, hyperlipidemia, who presents for followup consultation of his right-sided predominant Parkinson's disease, most likely akinetic-rigid type, complicated by anxiety, mild depression, sleep disorder, including sleep fragmentation, nocturia, insomnia, obesity, arthritis, and RBD. He is now status post recent left knee replacement surgery. He is recovering well. He had an increase in his Sinemet to 2 pills alternating with 1-1/2 pills for 4 doses. He is not 100% sure that he is taking it this way. He does report taking 2 pills first thing in the morning and for the second dose he does take one half pills. He or his wife will call and verify that this is indeed the regimen he is taking. He is feeling stable. Thankfully, he has not had any recent falls. He has finished outpatient physical therapy. He has ongoing issues with his right knee but the left knee is doing fairly well at this time. He is no longer using a walking cane. He is using Sinemet CR at bedtime. I would like to keep his medications the same which also includes clonazepam 0.5 mg at bedtime and low-dose Lexapro 5 mg strength once daily. I renewed all 4 medication prescriptions today. I suggested a 4 month checkup, sooner if needed. I answered all his questions today and he was in agreement.  I spent 25 minutes in total face-to-face time with the patient, more than 50% of which was spent in counseling and coordination of care, reviewing test results, reviewing medication and discussing or reviewing the diagnosis of PD, its prognosis and treatment options.

## 2015-04-26 NOTE — Telephone Encounter (Signed)
Raquel Sarna with [facility] is calling to get directions on medication carbidopa-levodopa (SINEMET IR) 25-100 MG tablet. Thank you.

## 2015-04-26 NOTE — Patient Instructions (Signed)
We will keep your medications the same at this time.  Please call and verify, that you are taking the sinemet 2 pills alternating with 1 1/2 pills for 4 total doses daily, the CR is at bedtime and clonazepam is at bedtime, lexapro 5 mg daily. Follow up in 4 months.

## 2015-04-26 NOTE — Telephone Encounter (Signed)
OV note says: He had an increase in his Sinemet to 2 pills alternating with 1-1/2 pills for 4 doses. He is not 100% sure that he is taking it this way. He does report taking 2 pills first thing in the morning and for the second dose he does take one half pills. He or his wife will call and verify that this is indeed the regimen he is taking.  I called Ms Lathem.  Got no answer.  Left message.

## 2015-05-03 DIAGNOSIS — R350 Frequency of micturition: Secondary | ICD-10-CM | POA: Diagnosis not present

## 2015-05-03 MED ORDER — CARBIDOPA-LEVODOPA 25-100 MG PO TABS: ORAL_TABLET | ORAL | Status: DC

## 2015-05-03 NOTE — Telephone Encounter (Signed)
I have not heard back, so I called again and spoke with Ms Skillin.  She verified patient is taking 2 tabs alternating with 1.5 tabs for four doses.  Rx has been resent.  Pharmacy confirmed receipt.

## 2015-05-10 DIAGNOSIS — R350 Frequency of micturition: Secondary | ICD-10-CM | POA: Diagnosis not present

## 2015-05-10 DIAGNOSIS — R4789 Other speech disturbances: Secondary | ICD-10-CM | POA: Diagnosis not present

## 2015-05-10 DIAGNOSIS — G20 Parkinson's disease: Secondary | ICD-10-CM | POA: Diagnosis not present

## 2015-05-10 DIAGNOSIS — J310 Chronic rhinitis: Secondary | ICD-10-CM | POA: Diagnosis not present

## 2015-05-17 DIAGNOSIS — Z96652 Presence of left artificial knee joint: Secondary | ICD-10-CM | POA: Diagnosis not present

## 2015-05-17 DIAGNOSIS — Z471 Aftercare following joint replacement surgery: Secondary | ICD-10-CM | POA: Diagnosis not present

## 2015-05-17 DIAGNOSIS — M1711 Unilateral primary osteoarthritis, right knee: Secondary | ICD-10-CM | POA: Diagnosis not present

## 2015-05-17 DIAGNOSIS — R350 Frequency of micturition: Secondary | ICD-10-CM | POA: Diagnosis not present

## 2015-05-21 ENCOUNTER — Telehealth: Payer: Self-pay | Admitting: Pharmacist

## 2015-05-21 NOTE — Telephone Encounter (Signed)
Pt having TKA with date TBD by Dr. Susa Day with [facility]. Request to hold Coumadin x5 days. Dr. Radford Pax has already provided pt with medical clearance - pt is ok to hold Coumadin x5 days as well. On Coumadin for afib with CHADS2 score of 2 (age, HTN). Does not need Lovenox bridge. Clearance form faxed by Valetta Fuller, RN on 05/21/15.

## 2015-05-24 DIAGNOSIS — R350 Frequency of micturition: Secondary | ICD-10-CM | POA: Diagnosis not present

## 2015-05-29 DIAGNOSIS — G20 Parkinson's disease: Secondary | ICD-10-CM | POA: Diagnosis not present

## 2015-05-29 DIAGNOSIS — Z01818 Encounter for other preprocedural examination: Secondary | ICD-10-CM | POA: Diagnosis not present

## 2015-05-29 DIAGNOSIS — M1990 Unspecified osteoarthritis, unspecified site: Secondary | ICD-10-CM | POA: Diagnosis not present

## 2015-05-29 DIAGNOSIS — I10 Essential (primary) hypertension: Secondary | ICD-10-CM | POA: Diagnosis not present

## 2015-05-29 DIAGNOSIS — I482 Chronic atrial fibrillation: Secondary | ICD-10-CM | POA: Diagnosis not present

## 2015-05-31 DIAGNOSIS — R350 Frequency of micturition: Secondary | ICD-10-CM | POA: Diagnosis not present

## 2015-06-01 ENCOUNTER — Ambulatory Visit: Payer: Self-pay | Admitting: Orthopedic Surgery

## 2015-06-01 ENCOUNTER — Ambulatory Visit (INDEPENDENT_AMBULATORY_CARE_PROVIDER_SITE_OTHER): Payer: Medicare Other | Admitting: *Deleted

## 2015-06-01 DIAGNOSIS — I482 Chronic atrial fibrillation, unspecified: Secondary | ICD-10-CM

## 2015-06-01 DIAGNOSIS — Z7901 Long term (current) use of anticoagulants: Secondary | ICD-10-CM | POA: Diagnosis not present

## 2015-06-01 DIAGNOSIS — Z5181 Encounter for therapeutic drug level monitoring: Secondary | ICD-10-CM | POA: Diagnosis not present

## 2015-06-01 DIAGNOSIS — I4891 Unspecified atrial fibrillation: Secondary | ICD-10-CM | POA: Diagnosis not present

## 2015-06-01 LAB — POCT INR: INR: 2.9

## 2015-06-14 DIAGNOSIS — R350 Frequency of micturition: Secondary | ICD-10-CM | POA: Diagnosis not present

## 2015-06-21 DIAGNOSIS — R350 Frequency of micturition: Secondary | ICD-10-CM | POA: Diagnosis not present

## 2015-07-04 ENCOUNTER — Telehealth: Payer: Self-pay | Admitting: Pharmacist

## 2015-07-04 ENCOUNTER — Encounter: Payer: Self-pay | Admitting: Podiatry

## 2015-07-04 ENCOUNTER — Ambulatory Visit (INDEPENDENT_AMBULATORY_CARE_PROVIDER_SITE_OTHER): Payer: Medicare Other | Admitting: Podiatry

## 2015-07-04 DIAGNOSIS — L84 Corns and callosities: Secondary | ICD-10-CM | POA: Diagnosis not present

## 2015-07-04 DIAGNOSIS — B351 Tinea unguium: Secondary | ICD-10-CM | POA: Diagnosis not present

## 2015-07-04 DIAGNOSIS — M79676 Pain in unspecified toe(s): Secondary | ICD-10-CM | POA: Diagnosis not present

## 2015-07-04 NOTE — Telephone Encounter (Signed)
Pt called to let us know his TKA is scheduled for 4/20. Already cleared to hold Coumadin x5 days prior to procedure - pt verbalized understanding. Pt will have home health check his INRs after his procedure. Canceled next Coumadin appt and will await call from from home health.

## 2015-07-04 NOTE — Progress Notes (Signed)
Patient ID: Brian [name], male   DOB: [birth year], 75 y.o.   MRN: [id number] Complaint:  Visit Type: Patient returns to my office for continued preventative foot care services. Complaint: Patient states" my nails have grown long and thick and become painful to walk and wear shoes" . The patient presents for preventative foot care services. No changes to ROS  Podiatric Exam: Vascular: dorsalis pedis and posterior tibial pulses are palpable bilateral. Capillary return is immediate. Temperature gradient is WNL. Skin turgor WNL  Sensorium: Normal Semmes Weinstein monofilament test. Normal tactile sensation bilaterally. Nail Exam: Pt has thick disfigured discolored nails with subungual debris noted bilateral entire nail hallux through fifth toenails Ulcer Exam: There is no evidence of ulcer or pre-ulcerative changes or infection. Orthopedic Exam: Muscle tone and strength are WNL. No limitations in general ROM. No crepitus or effusions noted. Foot type and digits show no abnormalities. Bony prominences are unremarkable. Skin: No Porokeratosis. No infection or ulcers  Diagnosis:  Onychomycosis, , Pain in right toe, pain in left toes,  Heel callus left foot.  Treatment & Plan Procedures and Treatment: Consent by patient was obtained for treatment procedures. The patient understood the discussion of treatment and procedures well. All questions were answered thoroughly reviewed. Debridement of mycotic and hypertrophic toenails, 1 through 5 bilateral and clearing of subungual debris. No ulceration, no infection noted. Debride callus left heel. Return Visit-Office Procedure: Patient instructed to return to the office for a follow up visit 3 months for continued evaluation and treatment.   Gardiner Barefoot DPM

## 2015-07-05 ENCOUNTER — Ambulatory Visit (HOSPITAL_COMMUNITY)
Admission: RE | Admit: 2015-07-05 | Discharge: 2015-07-05 | Disposition: A | Discharge status: Home / Self Care | Payer: Medicare Other | Source: Ambulatory Visit | Attending: Orthopedic Surgery | Admitting: Orthopedic Surgery

## 2015-07-05 ENCOUNTER — Encounter (HOSPITAL_COMMUNITY)
Admission: RE | Admit: 2015-07-05 | Discharge: 2015-07-05 | Disposition: A | Discharge status: Home / Self Care | Payer: Medicare Other | Source: Ambulatory Visit | Attending: Specialist | Admitting: Specialist

## 2015-07-05 ENCOUNTER — Encounter (HOSPITAL_COMMUNITY): Payer: Self-pay

## 2015-07-05 DIAGNOSIS — M1711 Unilateral primary osteoarthritis, right knee: Secondary | ICD-10-CM | POA: Insufficient documentation

## 2015-07-05 DIAGNOSIS — M179 Osteoarthritis of knee, unspecified: Secondary | ICD-10-CM | POA: Diagnosis not present

## 2015-07-05 LAB — URINALYSIS, ROUTINE W REFLEX MICROSCOPIC
BILIRUBIN URINE: NEGATIVE
GLUCOSE, UA: NEGATIVE mg/dL
Hgb urine dipstick: NEGATIVE
KETONES UR: NEGATIVE mg/dL
LEUKOCYTES UA: NEGATIVE
Nitrite: NEGATIVE
PH: 5 (ref 5.0–8.0)
Protein, ur: NEGATIVE mg/dL
SPECIFIC GRAVITY, URINE: 1.024 (ref 1.005–1.030)

## 2015-07-05 LAB — CBC WITH DIFFERENTIAL/PLATELET
BASOS ABS: 0 10*3/uL (ref 0.0–0.1)
BASOS PCT: 0 %
EOS ABS: 0.2 10*3/uL (ref 0.0–0.7)
Eosinophils Relative: 6 %
HCT: 38.5 % — ABNORMAL LOW (ref 39.0–52.0)
HEMOGLOBIN: 13.1 g/dL (ref 13.0–17.0)
Lymphocytes Relative: 31 %
Lymphs Abs: 1 10*3/uL (ref 0.7–4.0)
MCH: 30.4 pg (ref 26.0–34.0)
MCHC: 34 g/dL (ref 30.0–36.0)
MCV: 89.3 fL (ref 78.0–100.0)
Monocytes Absolute: 0.4 10*3/uL (ref 0.1–1.0)
Monocytes Relative: 14 %
NEUTROS PCT: 49 %
Neutro Abs: 1.6 10*3/uL — ABNORMAL LOW (ref 1.7–7.7)
PLATELETS: 149 10*3/uL — AB (ref 150–400)
RBC: 4.31 MIL/uL (ref 4.22–5.81)
RDW: 14.3 % (ref 11.5–15.5)
WBC: 3.2 10*3/uL — AB (ref 4.0–10.5)

## 2015-07-05 LAB — BASIC METABOLIC PANEL
Anion gap: 7 (ref 5–15)
BUN: 21 mg/dL — ABNORMAL HIGH (ref 6–20)
CALCIUM: 9.8 mg/dL (ref 8.9–10.3)
CHLORIDE: 103 mmol/L (ref 101–111)
CO2: 28 mmol/L (ref 22–32)
CREATININE: 1.16 mg/dL (ref 0.61–1.24)
Glucose, Bld: 94 mg/dL (ref 65–99)
Potassium: 4.5 mmol/L (ref 3.5–5.1)
SODIUM: 138 mmol/L (ref 135–145)

## 2015-07-05 LAB — SURGICAL PCR SCREEN
MRSA, PCR: NEGATIVE
STAPHYLOCOCCUS AUREUS: NEGATIVE

## 2015-07-05 LAB — APTT: APTT: 36 s (ref 24–37)

## 2015-07-05 LAB — PROTIME-INR
INR: 2.36 — AB (ref 0.00–1.49)
PROTHROMBIN TIME: 25.6 s — AB (ref 11.6–15.2)

## 2015-07-05 NOTE — Patient Instructions (Signed)
YOUR PROCEDURE IS SCHEDULED ON :  07/12/15  REPORT TO [facility] MAIN ENTRANCE FOLLOW SIGNS TO EAST ELEVATOR - GO TO 3rd FLOOR CHECK IN AT 3 EAST NURSES STATION (SHORT STAY) AT: 5:30 AM  CALL THIS NUMBER IF YOU HAVE PROBLEMS THE MORNING OF SURGERY [phone]  REMEMBER:ONLY 1 PER PERSON MAY GO TO SHORT STAY WITH YOU TO GET READY THE MORNING OF YOUR SURGERY  DO NOT EAT FOOD OR DRINK LIQUIDS AFTER MIDNIGHT  TAKE THESE MEDICINES THE MORNING OF SURGERY:  AMLODIPINE / SINEMET / DOXAZOSIN / FAMOTIDINE / GABAPENTIN / PRAVASTATIN / BYSTOLIC  YOU MAY NOT HAVE ANY METAL ON YOUR BODY INCLUDING HAIR PINS AND PIERCING'S. DO NOT WEAR JEWELRY, MAKEUP, LOTIONS, POWDERS OR PERFUMES. DO NOT WEAR NAIL POLISH. DO NOT SHAVE 48 HRS PRIOR TO SURGERY. MEN MAY SHAVE FACE AND NECK.  DO NOT [place]. [facility] IS NOT RESPONSIBLE FOR VALUABLES.  CONTACTS, DENTURES OR PARTIALS MAY NOT BE WORN TO SURGERY. LEAVE SUITCASE IN CAR. CAN BE BROUGHT TO ROOM AFTER SURGERY.  PATIENTS DISCHARGED THE DAY OF SURGERY WILL NOT BE ALLOWED TO DRIVE HOME.  PLEASE READ OVER THE FOLLOWING INSTRUCTION SHEETS _________________________________________________________________________________                                          [facility] - PREPARING FOR SURGERY  Before surgery, you can play an important role.  Because skin is not sterile, your skin needs to be as free of germs as possible.  You can reduce the number of germs on your skin by washing with CHG (chlorahexidine gluconate) soap before surgery.  CHG is an antiseptic cleaner which kills germs and bonds with the skin to continue killing germs even after washing. Please DO NOT use if you have an allergy to CHG or antibacterial soaps.  If your skin becomes reddened/irritated stop using the CHG and inform your nurse when you arrive at Short Stay. Do not shave (including legs and underarms) for at least 48 hours prior to the first CHG  shower.  You may shave your face. Please follow these instructions carefully:   1.  Shower with CHG Soap the night before surgery and the  morning of Surgery.   2.  If you choose to wash your hair, wash your hair first as usual with your  normal  Shampoo.   3.  After you shampoo, rinse your hair and body thoroughly to remove the  shampoo.                                         4.  Use CHG as you would any other liquid soap.  You can apply chg directly  to the skin and wash . Gently wash with scrungie or clean wascloth    5.  Apply the CHG Soap to your body ONLY FROM THE NECK DOWN.   Do not use on open                           Wound or open sores. Avoid contact with eyes, ears mouth and genitals (private parts).  Genitals (private parts) with your normal soap.              6.  Wash thoroughly, paying special attention to the area where your surgery  will be performed.   7.  Thoroughly rinse your body with warm water from the neck down.   8.  DO NOT shower/wash with your normal soap after using and rinsing off  the CHG Soap .                9.  Pat yourself dry with a clean towel.             10.  Wear clean night clothes to bed after shower             11.  Place clean sheets on your bed the night of your first shower and do not  sleep with pets.  Day of Surgery : Do not apply any lotions/deodorants the morning of surgery.  Please wear clean clothes to the hospital/surgery center.  FAILURE TO FOLLOW THESE INSTRUCTIONS MAY RESULT IN THE CANCELLATION OF YOUR SURGERY    PATIENT SIGNATURE_________________________________  ______________________________________________________________________     Brian [name]  An incentive spirometer is a tool that can help keep your lungs clear and active. This tool measures how well you are filling your lungs with each breath. Taking long deep breaths may help reverse or decrease the chance of developing  breathing (pulmonary) problems (especially infection) following:  A long period of time when you are unable to move or be active. BEFORE THE PROCEDURE   If the spirometer includes an indicator to show your best effort, your nurse or respiratory therapist will set it to a desired goal.  If possible, sit up straight or lean slightly forward. Try not to slouch.  Hold the incentive spirometer in an upright position. INSTRUCTIONS FOR USE  1. Sit on the edge of your bed if possible, or sit up as far as you can in bed or on a chair. 2. Hold the incentive spirometer in an upright position. 3. Breathe out normally. 4. Place the mouthpiece in your mouth and seal your lips tightly around it. 5. Breathe in slowly and as deeply as possible, raising the piston or the ball toward the top of the column. 6. Hold your breath for 3-5 seconds or for as long as possible. Allow the piston or ball to fall to the bottom of the column. 7. Remove the mouthpiece from your mouth and breathe out normally. 8. Rest for a few seconds and repeat Steps 1 through 7 at least 10 times every 1-2 hours when you are awake. Take your time and take a few normal breaths between deep breaths. 9. The spirometer may include an indicator to show your best effort. Use the indicator as a goal to work toward during each repetition. 10. After each set of 10 deep breaths, practice coughing to be sure your lungs are clear. If you have an incision (the cut made at the time of surgery), support your incision when coughing by placing a pillow or rolled up towels firmly against it. Once you are able to get out of bed, walk around indoors and cough well. You may stop using the incentive spirometer when instructed by your caregiver.  RISKS AND COMPLICATIONS  Take your time so you do not get dizzy or light-headed.  If you are in pain, you may need to take or ask for pain medication before doing incentive spirometry. It is  harder to take a deep  breath if you are having pain. AFTER USE  Rest and breathe slowly and easily.  It can be helpful to keep track of a log of your progress. Your caregiver can provide you with a simple table to help with this. If you are using the spirometer at home, follow these instructions: [place] IF:   You are having difficultly using the spirometer.  You have trouble using the spirometer as often as instructed.  Your pain medication is not giving enough relief while using the spirometer.  You develop fever of 100.5 F (38.1 C) or higher. SEEK IMMEDIATE MEDICAL CARE IF:   You cough up bloody sputum that had not been present before.  You develop fever of 102 F (38.9 C) or greater.  You develop worsening pain at or near the incision site. MAKE SURE YOU:   Understand these instructions.  Will watch your condition.  Will get help right away if you are not doing well or get worse. Document Released: 07/21/2006 Document Revised: 06/02/2011 Document Reviewed: 09/21/2006 [facility] Patient Information 2014 [place], [place].   ________________________________________________________________________

## 2015-07-11 ENCOUNTER — Other Ambulatory Visit: Payer: Self-pay | Admitting: Neurology

## 2015-07-11 ENCOUNTER — Encounter (HOSPITAL_COMMUNITY): Payer: Self-pay | Admitting: Anesthesiology

## 2015-07-11 NOTE — Anesthesia Preprocedure Evaluation (Addendum)
Anesthesia Evaluation  Patient identified by MRN, date of birth, ID band Patient awake    Reviewed: Allergy & Precautions, NPO status , Patient's Chart, lab work & pertinent test results  Airway Mallampati: II  TM Distance: >3 FB Neck ROM: Full    Dental no notable dental hx. (+)    Pulmonary shortness of breath,    Pulmonary exam normal breath sounds clear to auscultation       Cardiovascular hypertension, Pt. on medications and Pt. on home beta blockers Normal cardiovascular exam+ dysrhythmias + Valvular Problems/Murmurs  Rhythm:Regular Rate:Normal     Neuro/Psych Anxiety negative neurological ROS     GI/Hepatic Neg liver ROS, GERD  Medicated,  Endo/Other  negative endocrine ROS  Renal/GU Renal disease  negative genitourinary   Musculoskeletal  (+) Arthritis ,   Abdominal (+) + obese,   Peds negative pediatric ROS (+)  Hematology negative hematology ROS (+)   Anesthesia Other Findings   Reproductive/Obstetrics negative OB ROS                          Anesthesia Physical Anesthesia Plan  ASA: III  Anesthesia Plan: General   Post-op Pain Management: GA combined w/ Regional for post-op pain   Induction: Intravenous  Airway Management Planned: Oral ETT  Additional Equipment:   Intra-op Plan:   Post-operative Plan: Extubation in OR  Informed Consent: I have reviewed the patients History and Physical, chart, labs and discussed the procedure including the risks, benefits and alternatives for the proposed anesthesia with the patient or authorized representative who has indicated his/her understanding and acceptance.   Dental advisory given  Plan Discussed with: CRNA  Anesthesia Plan Comments: (Discussed spinal and general with adductor canal block. He had a general with [facility] with other TKA. INR 1.38. Plan general with [facility]. Patient consents. Questions answered.)      Anesthesia  Quick Evaluation

## 2015-07-12 ENCOUNTER — Inpatient Hospital Stay (HOSPITAL_COMMUNITY)
Admission: RE | Admit: 2015-07-12 | Discharge: 2015-07-15 | DRG: 470 | Disposition: A | Discharge status: Skilled Nursing Facility | Payer: Medicare Other | Source: Ambulatory Visit | Attending: Specialist | Admitting: Specialist

## 2015-07-12 ENCOUNTER — Encounter (HOSPITAL_COMMUNITY): Payer: Self-pay | Admitting: *Deleted

## 2015-07-12 ENCOUNTER — Inpatient Hospital Stay (HOSPITAL_COMMUNITY): Payer: Medicare Other | Admitting: Anesthesiology

## 2015-07-12 ENCOUNTER — Encounter (HOSPITAL_COMMUNITY)
Admission: RE | Disposition: A | Discharge status: Skilled Nursing Facility | Payer: Self-pay | Source: Ambulatory Visit | Attending: Specialist

## 2015-07-12 ENCOUNTER — Inpatient Hospital Stay (HOSPITAL_COMMUNITY): Payer: Medicare Other

## 2015-07-12 DIAGNOSIS — D696 Thrombocytopenia, unspecified: Secondary | ICD-10-CM | POA: Diagnosis present

## 2015-07-12 DIAGNOSIS — Z96659 Presence of unspecified artificial knee joint: Secondary | ICD-10-CM

## 2015-07-12 DIAGNOSIS — M1711 Unilateral primary osteoarthritis, right knee: Principal | ICD-10-CM | POA: Diagnosis present

## 2015-07-12 DIAGNOSIS — I482 Chronic atrial fibrillation: Secondary | ICD-10-CM | POA: Diagnosis present

## 2015-07-12 DIAGNOSIS — Z79899 Other long term (current) drug therapy: Secondary | ICD-10-CM

## 2015-07-12 DIAGNOSIS — F419 Anxiety disorder, unspecified: Secondary | ICD-10-CM | POA: Diagnosis present

## 2015-07-12 DIAGNOSIS — N400 Enlarged prostate without lower urinary tract symptoms: Secondary | ICD-10-CM | POA: Diagnosis present

## 2015-07-12 DIAGNOSIS — E785 Hyperlipidemia, unspecified: Secondary | ICD-10-CM | POA: Diagnosis present

## 2015-07-12 DIAGNOSIS — G20 Parkinson's disease: Secondary | ICD-10-CM | POA: Diagnosis present

## 2015-07-12 DIAGNOSIS — K219 Gastro-esophageal reflux disease without esophagitis: Secondary | ICD-10-CM | POA: Diagnosis not present

## 2015-07-12 DIAGNOSIS — Z96652 Presence of left artificial knee joint: Secondary | ICD-10-CM | POA: Diagnosis present

## 2015-07-12 DIAGNOSIS — M25561 Pain in right knee: Secondary | ICD-10-CM | POA: Diagnosis not present

## 2015-07-12 DIAGNOSIS — I10 Essential (primary) hypertension: Secondary | ICD-10-CM | POA: Diagnosis not present

## 2015-07-12 DIAGNOSIS — R600 Localized edema: Secondary | ICD-10-CM | POA: Diagnosis present

## 2015-07-12 DIAGNOSIS — R2681 Unsteadiness on feet: Secondary | ICD-10-CM | POA: Diagnosis not present

## 2015-07-12 DIAGNOSIS — F329 Major depressive disorder, single episode, unspecified: Secondary | ICD-10-CM | POA: Diagnosis present

## 2015-07-12 DIAGNOSIS — Z6832 Body mass index (BMI) 32.0-32.9, adult: Secondary | ICD-10-CM | POA: Diagnosis not present

## 2015-07-12 DIAGNOSIS — N182 Chronic kidney disease, stage 2 (mild): Secondary | ICD-10-CM | POA: Diagnosis present

## 2015-07-12 DIAGNOSIS — E669 Obesity, unspecified: Secondary | ICD-10-CM | POA: Diagnosis present

## 2015-07-12 DIAGNOSIS — M179 Osteoarthritis of knee, unspecified: Secondary | ICD-10-CM | POA: Diagnosis not present

## 2015-07-12 DIAGNOSIS — N189 Chronic kidney disease, unspecified: Secondary | ICD-10-CM | POA: Diagnosis not present

## 2015-07-12 DIAGNOSIS — M6281 Muscle weakness (generalized): Secondary | ICD-10-CM | POA: Diagnosis not present

## 2015-07-12 DIAGNOSIS — Z7901 Long term (current) use of anticoagulants: Secondary | ICD-10-CM | POA: Diagnosis not present

## 2015-07-12 DIAGNOSIS — Z96651 Presence of right artificial knee joint: Secondary | ICD-10-CM | POA: Diagnosis not present

## 2015-07-12 DIAGNOSIS — I129 Hypertensive chronic kidney disease with stage 1 through stage 4 chronic kidney disease, or unspecified chronic kidney disease: Secondary | ICD-10-CM | POA: Diagnosis present

## 2015-07-12 DIAGNOSIS — I340 Nonrheumatic mitral (valve) insufficiency: Secondary | ICD-10-CM | POA: Diagnosis present

## 2015-07-12 DIAGNOSIS — R278 Other lack of coordination: Secondary | ICD-10-CM | POA: Diagnosis not present

## 2015-07-12 DIAGNOSIS — G20C Parkinsonism, unspecified: Secondary | ICD-10-CM | POA: Diagnosis present

## 2015-07-12 DIAGNOSIS — M25569 Pain in unspecified knee: Secondary | ICD-10-CM | POA: Diagnosis not present

## 2015-07-12 DIAGNOSIS — G8918 Other acute postprocedural pain: Secondary | ICD-10-CM | POA: Diagnosis not present

## 2015-07-12 DIAGNOSIS — Z471 Aftercare following joint replacement surgery: Secondary | ICD-10-CM | POA: Diagnosis not present

## 2015-07-12 HISTORY — PX: TOTAL KNEE ARTHROPLASTY: SHX125

## 2015-07-12 LAB — PROTIME-INR
INR: 1.38 (ref 0.00–1.49)
PROTHROMBIN TIME: 16.6 s — AB (ref 11.6–15.2)

## 2015-07-12 LAB — CREATININE, SERUM
Creatinine, Ser: 0.83 mg/dL (ref 0.61–1.24)
GFR calc Af Amer: >60 mL/min (ref >60)
GFR calc non Af Amer: >60 mL/min (ref >60)

## 2015-07-12 LAB — TYPE AND SCREEN
ABO/RH(D): O POS
ANTIBODY SCREEN: NEGATIVE

## 2015-07-12 LAB — APTT: aPTT: 29 seconds (ref 24–37)

## 2015-07-12 SURGERY — ARTHROPLASTY, KNEE, TOTAL
Anesthesia: General | Site: Knee | Laterality: Right

## 2015-07-12 MED ORDER — CARBIDOPA-LEVODOPA 25-100 MG PO TABS
2.0000 | ORAL_TABLET | Freq: Two times a day (BID) | ORAL | Status: DC
  Administered 2015-07-12 – 2015-07-15 (×6): 2 via ORAL
  Filled 2015-07-12 (×9): qty 2

## 2015-07-12 MED ORDER — TRAMADOL HCL 50 MG PO TABS: 50.0000 mg | ORAL_TABLET | Freq: Four times a day (QID) | ORAL | Status: DC | PRN

## 2015-07-12 MED ORDER — TRANEXAMIC ACID 1000 MG/10ML IV SOLN
1000.0000 mg | INTRAVENOUS | Status: AC
  Administered 2015-07-12: 1000 mg via INTRAVENOUS
  Filled 2015-07-12: qty 10

## 2015-07-12 MED ORDER — DEXTROSE 5 % IV SOLN
10.0000 mg | INTRAVENOUS | Status: DC | PRN
  Administered 2015-07-12: 40 ug/min via INTRAVENOUS

## 2015-07-12 MED ORDER — LIDOCAINE HCL (CARDIAC) 20 MG/ML IV SOLN
INTRAVENOUS | Status: AC
  Filled 2015-07-12: qty 5

## 2015-07-12 MED ORDER — WARFARIN - PHARMACIST DOSING INPATIENT: Freq: Every day | Status: DC

## 2015-07-12 MED ORDER — KCL IN DEXTROSE-NACL 20-5-0.45 MEQ/L-%-% IV SOLN
INTRAVENOUS | Status: AC
  Administered 2015-07-12: 18:00:00 via INTRAVENOUS
  Filled 2015-07-12 (×2): qty 1000

## 2015-07-12 MED ORDER — CEFAZOLIN SODIUM-DEXTROSE 2-4 GM/100ML-% IV SOLN
2.0000 g | Freq: Four times a day (QID) | INTRAVENOUS | Status: AC
  Administered 2015-07-12 (×2): 2 g via INTRAVENOUS
  Filled 2015-07-12 (×2): qty 100

## 2015-07-12 MED ORDER — ALUM & MAG HYDROXIDE-SIMETH 200-200-20 MG/5ML PO SUSP: 30.0000 mL | ORAL | Status: DC | PRN

## 2015-07-12 MED ORDER — WARFARIN SODIUM 5 MG PO TABS
5.0000 mg | ORAL_TABLET | Freq: Every day | ORAL | Status: AC
  Administered 2015-07-12: 5 mg via ORAL
  Filled 2015-07-12: qty 1

## 2015-07-12 MED ORDER — CLINDAMYCIN PHOSPHATE 900 MG/50ML IV SOLN
900.0000 mg | Freq: Once | INTRAVENOUS | Status: AC
  Administered 2015-07-12: 900 mg via INTRAVENOUS

## 2015-07-12 MED ORDER — CLONAZEPAM 0.5 MG PO TABS
0.5000 mg | ORAL_TABLET | Freq: Every day | ORAL | Status: DC
  Administered 2015-07-12 – 2015-07-14 (×3): 0.5 mg via ORAL
  Filled 2015-07-12 (×3): qty 1

## 2015-07-12 MED ORDER — PHENYLEPHRINE 40 MCG/ML (10ML) SYRINGE FOR IV PUSH (FOR BLOOD PRESSURE SUPPORT)
PREFILLED_SYRINGE | INTRAVENOUS | Status: AC
  Filled 2015-07-12: qty 10

## 2015-07-12 MED ORDER — PHENOL 1.4 % MT LIQD: 1.0000 | OROMUCOSAL | Status: DC | PRN

## 2015-07-12 MED ORDER — DOCUSATE SODIUM 100 MG PO CAPS: 100.0000 mg | ORAL_CAPSULE | Freq: Two times a day (BID) | ORAL | Status: DC

## 2015-07-12 MED ORDER — BUPIVACAINE-EPINEPHRINE (PF) 0.5% -1:200000 IJ SOLN
INTRAMUSCULAR | Status: AC
  Filled 2015-07-12: qty 30

## 2015-07-12 MED ORDER — LACTATED RINGERS IV SOLN
INTRAVENOUS | Status: DC
  Administered 2015-07-12 (×2): via INTRAVENOUS

## 2015-07-12 MED ORDER — ROCURONIUM BROMIDE 100 MG/10ML IV SOLN
INTRAVENOUS | Status: AC
  Filled 2015-07-12: qty 1

## 2015-07-12 MED ORDER — HYDROCODONE-ACETAMINOPHEN 5-325 MG PO TABS: 1.0000 | ORAL_TABLET | Freq: Four times a day (QID) | ORAL | Status: DC | PRN

## 2015-07-12 MED ORDER — SALINE SPRAY 0.65 % NA SOLN: 2.0000 | Freq: Four times a day (QID) | NASAL | Status: DC | PRN

## 2015-07-12 MED ORDER — HYDROMORPHONE HCL 1 MG/ML IJ SOLN: 0.2500 mg | INTRAMUSCULAR | Status: DC | PRN

## 2015-07-12 MED ORDER — METHOCARBAMOL 1000 MG/10ML IJ SOLN
500.0000 mg | Freq: Four times a day (QID) | INTRAVENOUS | Status: DC | PRN
  Filled 2015-07-12: qty 5

## 2015-07-12 MED ORDER — NALOXEGOL OXALATE 12.5 MG PO TABS
12.5000 mg | ORAL_TABLET | Freq: Every day | ORAL | Status: DC
  Administered 2015-07-12 – 2015-07-15 (×4): 12.5 mg via ORAL
  Filled 2015-07-12 (×4): qty 1

## 2015-07-12 MED ORDER — DEXTROSE 5 % IV SOLN
2.0000 g | INTRAVENOUS | Status: AC
  Administered 2015-07-12: 2 g via INTRAVENOUS

## 2015-07-12 MED ORDER — ONDANSETRON HCL 4 MG/2ML IJ SOLN: 4.0000 mg | Freq: Four times a day (QID) | INTRAMUSCULAR | Status: DC | PRN

## 2015-07-12 MED ORDER — SODIUM CHLORIDE 0.9 % IR SOLN
Status: DC | PRN
  Administered 2015-07-12: 500 mL

## 2015-07-12 MED ORDER — ONDANSETRON HCL 4 MG/2ML IJ SOLN
INTRAMUSCULAR | Status: DC | PRN
  Administered 2015-07-12: 4 mg via INTRAVENOUS

## 2015-07-12 MED ORDER — BISACODYL 5 MG PO TBEC
5.0000 mg | DELAYED_RELEASE_TABLET | Freq: Every day | ORAL | Status: DC | PRN
  Administered 2015-07-14: 5 mg via ORAL
  Filled 2015-07-12: qty 1

## 2015-07-12 MED ORDER — PHENYLEPHRINE HCL 10 MG/ML IJ SOLN
INTRAMUSCULAR | Status: DC | PRN
  Administered 2015-07-12: 40 ug via INTRAVENOUS

## 2015-07-12 MED ORDER — ACETAMINOPHEN 325 MG PO TABS
650.0000 mg | ORAL_TABLET | Freq: Four times a day (QID) | ORAL | Status: DC | PRN
  Administered 2015-07-14 (×2): 650 mg via ORAL
  Filled 2015-07-12 (×2): qty 2

## 2015-07-12 MED ORDER — CLINDAMYCIN PHOSPHATE 900 MG/50ML IV SOLN
INTRAVENOUS | Status: AC
  Filled 2015-07-12: qty 50

## 2015-07-12 MED ORDER — SODIUM CHLORIDE 0.9 % IR SOLN
Status: AC
  Filled 2015-07-12: qty 1

## 2015-07-12 MED ORDER — PHENYLEPHRINE HCL 10 MG/ML IJ SOLN
INTRAMUSCULAR | Status: AC
  Filled 2015-07-12: qty 1

## 2015-07-12 MED ORDER — FENTANYL CITRATE (PF) 250 MCG/5ML IJ SOLN
INTRAMUSCULAR | Status: AC
  Filled 2015-07-12: qty 5

## 2015-07-12 MED ORDER — CARBIDOPA-LEVODOPA 25-100 MG PO TABS
1.5000 | ORAL_TABLET | Freq: Two times a day (BID) | ORAL | Status: DC
  Administered 2015-07-12 – 2015-07-15 (×6): 1.5 via ORAL
  Filled 2015-07-12 (×8): qty 1.5

## 2015-07-12 MED ORDER — BUPIVACAINE-EPINEPHRINE 0.25% -1:200000 IJ SOLN
INTRAMUSCULAR | Status: AC
  Filled 2015-07-12: qty 1

## 2015-07-12 MED ORDER — POTASSIUM CHLORIDE CRYS ER 10 MEQ PO TBCR
10.0000 meq | EXTENDED_RELEASE_TABLET | Freq: Two times a day (BID) | ORAL | Status: DC
  Administered 2015-07-12 – 2015-07-15 (×6): 10 meq via ORAL
  Filled 2015-07-12 (×7): qty 1

## 2015-07-12 MED ORDER — DOCUSATE SODIUM 100 MG PO CAPS
100.0000 mg | ORAL_CAPSULE | Freq: Two times a day (BID) | ORAL | Status: DC
  Administered 2015-07-12 – 2015-07-15 (×6): 100 mg via ORAL

## 2015-07-12 MED ORDER — FAMOTIDINE 40 MG PO TABS
40.0000 mg | ORAL_TABLET | Freq: Every day | ORAL | Status: DC
  Administered 2015-07-13 – 2015-07-15 (×3): 40 mg via ORAL
  Filled 2015-07-12 (×3): qty 1

## 2015-07-12 MED ORDER — FUROSEMIDE 20 MG PO TABS
20.0000 mg | ORAL_TABLET | Freq: Two times a day (BID) | ORAL | Status: DC
  Administered 2015-07-12 – 2015-07-15 (×6): 20 mg via ORAL
  Filled 2015-07-12 (×9): qty 1

## 2015-07-12 MED ORDER — POLYETHYLENE GLYCOL 3350 17 G PO PACK: 17.0000 g | PACK | Freq: Every day | ORAL | Status: DC | PRN

## 2015-07-12 MED ORDER — ROCURONIUM BROMIDE 100 MG/10ML IV SOLN
INTRAVENOUS | Status: DC | PRN
  Administered 2015-07-12: 40 mg via INTRAVENOUS

## 2015-07-12 MED ORDER — CARBIDOPA-LEVODOPA 25-100 MG PO TABS
1.5000 | ORAL_TABLET | Freq: Four times a day (QID) | ORAL | Status: DC
  Filled 2015-07-12 (×3): qty 2

## 2015-07-12 MED ORDER — HYDROMORPHONE HCL 1 MG/ML IJ SOLN
INTRAMUSCULAR | Status: DC | PRN
  Administered 2015-07-12 (×2): 0.5 mg via INTRAVENOUS

## 2015-07-12 MED ORDER — METOCLOPRAMIDE HCL 10 MG PO TABS: 5.0000 mg | ORAL_TABLET | Freq: Three times a day (TID) | ORAL | Status: DC | PRN

## 2015-07-12 MED ORDER — NEBIVOLOL HCL 10 MG PO TABS
10.0000 mg | ORAL_TABLET | Freq: Every morning | ORAL | Status: DC
  Administered 2015-07-13 – 2015-07-15 (×3): 10 mg via ORAL
  Filled 2015-07-12 (×3): qty 1

## 2015-07-12 MED ORDER — ACETAMINOPHEN 650 MG RE SUPP: 650.0000 mg | Freq: Four times a day (QID) | RECTAL | Status: DC | PRN

## 2015-07-12 MED ORDER — ENOXAPARIN SODIUM 40 MG/0.4ML ~~LOC~~ SOLN
40.0000 mg | Freq: Once | SUBCUTANEOUS | Status: DC
  Filled 2015-07-12: qty 0.4

## 2015-07-12 MED ORDER — PROCHLORPERAZINE EDISYLATE 5 MG/ML IJ SOLN
10.0000 mg | Freq: Once | INTRAMUSCULAR | Status: DC | PRN
  Filled 2015-07-12: qty 2

## 2015-07-12 MED ORDER — SUGAMMADEX SODIUM 200 MG/2ML IV SOLN
INTRAVENOUS | Status: AC
  Filled 2015-07-12: qty 2

## 2015-07-12 MED ORDER — AMLODIPINE BESYLATE 10 MG PO TABS
10.0000 mg | ORAL_TABLET | Freq: Every morning | ORAL | Status: DC
  Administered 2015-07-13: 10 mg via ORAL
  Filled 2015-07-12 (×3): qty 1

## 2015-07-12 MED ORDER — MAGNESIUM CITRATE PO SOLN
1.0000 | Freq: Once | ORAL | Status: DC | PRN
Start: 2015-07-12 — End: 2015-07-15

## 2015-07-12 MED ORDER — SODIUM CHLORIDE 0.9 % IR SOLN
Status: DC | PRN
  Administered 2015-07-12: 2000 mL

## 2015-07-12 MED ORDER — ONDANSETRON HCL 4 MG PO TABS: 4.0000 mg | ORAL_TABLET | Freq: Four times a day (QID) | ORAL | Status: DC | PRN

## 2015-07-12 MED ORDER — RISAQUAD PO CAPS
1.0000 | ORAL_CAPSULE | Freq: Every day | ORAL | Status: DC
  Administered 2015-07-12 – 2015-07-15 (×4): 1 via ORAL
  Filled 2015-07-12 (×4): qty 1

## 2015-07-12 MED ORDER — POLYETHYLENE GLYCOL 3350 17 G PO PACK
17.0000 g | PACK | Freq: Every day | ORAL | Status: DC
  Administered 2015-07-12 – 2015-07-15 (×4): 17 g via ORAL

## 2015-07-12 MED ORDER — ONDANSETRON 4 MG PO TBDP: 8.0000 mg | ORAL_TABLET | Freq: Three times a day (TID) | ORAL | Status: DC | PRN

## 2015-07-12 MED ORDER — IRBESARTAN 75 MG PO TABS
75.0000 mg | ORAL_TABLET | Freq: Every day | ORAL | Status: DC
  Administered 2015-07-12 – 2015-07-15 (×3): 75 mg via ORAL
  Filled 2015-07-12 (×4): qty 1

## 2015-07-12 MED ORDER — METOCLOPRAMIDE HCL 5 MG/ML IJ SOLN: 5.0000 mg | Freq: Three times a day (TID) | INTRAMUSCULAR | Status: DC | PRN

## 2015-07-12 MED ORDER — KETOCONAZOLE 2 % EX CREA
2.0000 "application " | TOPICAL_CREAM | CUTANEOUS | Status: DC
  Filled 2015-07-12: qty 15

## 2015-07-12 MED ORDER — TRAMADOL HCL 50 MG PO TABS
50.0000 mg | ORAL_TABLET | Freq: Four times a day (QID) | ORAL | Status: DC | PRN
  Administered 2015-07-13: 50 mg via ORAL
  Filled 2015-07-12 (×2): qty 1

## 2015-07-12 MED ORDER — ENOXAPARIN SODIUM 40 MG/0.4ML ~~LOC~~ SOLN
40.0000 mg | Freq: Two times a day (BID) | SUBCUTANEOUS | Status: DC
  Filled 2015-07-12: qty 0.4

## 2015-07-12 MED ORDER — HYDROMORPHONE HCL 2 MG/ML IJ SOLN
INTRAMUSCULAR | Status: AC
  Filled 2015-07-12: qty 1

## 2015-07-12 MED ORDER — SUGAMMADEX SODIUM 200 MG/2ML IV SOLN
INTRAVENOUS | Status: DC | PRN
  Administered 2015-07-12: 200 mg via INTRAVENOUS

## 2015-07-12 MED ORDER — MENTHOL 3 MG MT LOZG: 1.0000 | LOZENGE | OROMUCOSAL | Status: DC | PRN

## 2015-07-12 MED ORDER — DOXAZOSIN MESYLATE 2 MG PO TABS
2.0000 mg | ORAL_TABLET | Freq: Three times a day (TID) | ORAL | Status: DC
  Administered 2015-07-12 – 2015-07-15 (×8): 2 mg via ORAL
  Filled 2015-07-12 (×11): qty 1

## 2015-07-12 MED ORDER — WARFARIN SODIUM 5 MG PO TABS: 5.0000 mg | ORAL_TABLET | Freq: Every day | ORAL | Status: DC

## 2015-07-12 MED ORDER — ESCITALOPRAM OXALATE 5 MG PO TABS
5.0000 mg | ORAL_TABLET | Freq: Every day | ORAL | Status: DC
  Administered 2015-07-12 – 2015-07-14 (×3): 5 mg via ORAL
  Filled 2015-07-12 (×4): qty 1

## 2015-07-12 MED ORDER — STERILE WATER FOR IRRIGATION IR SOLN
Status: DC | PRN
  Administered 2015-07-12: 2000 mL

## 2015-07-12 MED ORDER — GABAPENTIN 100 MG PO CAPS
100.0000 mg | ORAL_CAPSULE | Freq: Three times a day (TID) | ORAL | Status: DC
  Administered 2015-07-12 – 2015-07-15 (×9): 100 mg via ORAL
  Filled 2015-07-12 (×11): qty 1

## 2015-07-12 MED ORDER — FENTANYL CITRATE (PF) 100 MCG/2ML IJ SOLN
INTRAMUSCULAR | Status: DC | PRN
  Administered 2015-07-12 (×2): 50 ug via INTRAVENOUS
  Administered 2015-07-12: 100 ug via INTRAVENOUS
  Administered 2015-07-12: 50 ug via INTRAVENOUS

## 2015-07-12 MED ORDER — METHOCARBAMOL 500 MG PO TABS
500.0000 mg | ORAL_TABLET | Freq: Four times a day (QID) | ORAL | Status: DC | PRN
  Administered 2015-07-12: 500 mg via ORAL
  Filled 2015-07-12: qty 1

## 2015-07-12 MED ORDER — CEFAZOLIN SODIUM-DEXTROSE 2-4 GM/100ML-% IV SOLN
INTRAVENOUS | Status: AC
  Filled 2015-07-12: qty 100

## 2015-07-12 MED ORDER — DIPHENHYDRAMINE HCL 12.5 MG/5ML PO ELIX: 12.5000 mg | ORAL_SOLUTION | ORAL | Status: DC | PRN

## 2015-07-12 MED ORDER — PROPOFOL 10 MG/ML IV BOLUS
INTRAVENOUS | Status: AC
  Filled 2015-07-12: qty 20

## 2015-07-12 MED ORDER — POLYETHYLENE GLYCOL 3350 17 G PO PACK: 17.0000 g | PACK | Freq: Every day | ORAL | Status: DC

## 2015-07-12 MED ORDER — TRAMADOL HCL 50 MG PO TABS
50.0000 mg | ORAL_TABLET | Freq: Four times a day (QID) | ORAL | Status: DC | PRN
Start: 2015-07-12 — End: 2015-07-12

## 2015-07-12 MED ORDER — CARBIDOPA-LEVODOPA ER 50-200 MG PO TBCR
1.0000 | EXTENDED_RELEASE_TABLET | Freq: Every day | ORAL | Status: DC
  Administered 2015-07-12 – 2015-07-14 (×3): 1 via ORAL
  Filled 2015-07-12 (×4): qty 1

## 2015-07-12 MED ORDER — HYDROCODONE-ACETAMINOPHEN 5-325 MG PO TABS
1.0000 | ORAL_TABLET | ORAL | Status: DC | PRN
  Administered 2015-07-12 – 2015-07-13 (×3): 2 via ORAL
  Filled 2015-07-12 (×3): qty 2

## 2015-07-12 MED ORDER — PROPOFOL 10 MG/ML IV BOLUS
INTRAVENOUS | Status: DC | PRN
Start: 2015-07-12 — End: 2015-07-12
  Administered 2015-07-12: 150 mg via INTRAVENOUS

## 2015-07-12 MED ORDER — BUPIVACAINE-EPINEPHRINE 0.25% -1:200000 IJ SOLN
INTRAMUSCULAR | Status: DC | PRN
  Administered 2015-07-12: 50 mL

## 2015-07-12 MED ORDER — HYDROMORPHONE HCL 1 MG/ML IJ SOLN
0.5000 mg | INTRAMUSCULAR | Status: DC | PRN
  Administered 2015-07-12: 0.5 mg via INTRAVENOUS
  Filled 2015-07-12: qty 1

## 2015-07-12 MED ORDER — LIDOCAINE HCL (CARDIAC) 20 MG/ML IV SOLN
INTRAVENOUS | Status: DC | PRN
  Administered 2015-07-12: 100 mg via INTRAVENOUS

## 2015-07-12 MED ORDER — ONDANSETRON HCL 4 MG/2ML IJ SOLN
INTRAMUSCULAR | Status: AC
  Filled 2015-07-12: qty 2

## 2015-07-12 SURGICAL SUPPLY — 60 items
BAG ZIPLOCK 12X15 (MISCELLANEOUS) ×2 IMPLANT
BANDAGE ACE 4X5 VEL STRL LF (GAUZE/BANDAGES/DRESSINGS) ×2 IMPLANT
BANDAGE ACE 6X5 VEL STRL LF (GAUZE/BANDAGES/DRESSINGS) ×2 IMPLANT
BLADE SAG 18X100X1.27 (BLADE) ×2 IMPLANT
BLADE SAW SGTL 13.0X1.19X90.0M (BLADE) ×2 IMPLANT
CAPT KNEE TOTAL 3 ATTUNE ×2 IMPLANT
CEMENT HV SMART SET (Cement) ×4 IMPLANT
CLOTH 2% CHLOROHEXIDINE 3PK (PERSONAL CARE ITEMS) ×2 IMPLANT
CUFF TOURN SGL QUICK 34 (TOURNIQUET CUFF) ×2
CUFF TRNQT CYL 34X4X40X1 (TOURNIQUET CUFF) ×1 IMPLANT
DECANTER SPIKE VIAL GLASS SM (MISCELLANEOUS) ×2 IMPLANT
DRAPE INCISE IOBAN 66X45 STRL (DRAPES) ×2 IMPLANT
DRAPE LG THREE QUARTER DISP (DRAPES) ×2 IMPLANT
DRAPE ORTHO SPLIT 77X108 STRL (DRAPES) ×4
DRAPE SURG ORHT 6 SPLT 77X108 (DRAPES) ×2 IMPLANT
DRAPE U-SHAPE 47X51 STRL (DRAPES) ×2 IMPLANT
DRSG AQUACEL AG ADV 3.5X10 (GAUZE/BANDAGES/DRESSINGS) ×2 IMPLANT
DRSG TEGADERM 4X4.75 (GAUZE/BANDAGES/DRESSINGS) IMPLANT
DURAPREP 26ML APPLICATOR (WOUND CARE) ×2 IMPLANT
ELECT BLADE TIP CTD 4 INCH (ELECTRODE) ×2 IMPLANT
ELECT REM PT RETURN 9FT ADLT (ELECTROSURGICAL) ×2
ELECTRODE REM PT RTRN 9FT ADLT (ELECTROSURGICAL) ×1 IMPLANT
EVACUATOR 1/8 PVC DRAIN (DRAIN) ×2 IMPLANT
GAUZE SPONGE 2X2 8PLY STRL LF (GAUZE/BANDAGES/DRESSINGS) IMPLANT
GLOVE BIOGEL PI IND STRL 7.0 (GLOVE) ×1 IMPLANT
GLOVE BIOGEL PI IND STRL 7.5 (GLOVE) ×3 IMPLANT
GLOVE BIOGEL PI IND STRL 8 (GLOVE) ×3 IMPLANT
GLOVE BIOGEL PI INDICATOR 7.0 (GLOVE) ×1
GLOVE BIOGEL PI INDICATOR 7.5 (GLOVE) ×3
GLOVE BIOGEL PI INDICATOR 8 (GLOVE) ×3
GLOVE SURG SS PI 7.0 STRL IVOR (GLOVE) ×4 IMPLANT
GLOVE SURG SS PI 7.5 STRL IVOR (GLOVE) ×2 IMPLANT
GLOVE SURG SS PI 8.0 STRL IVOR (GLOVE) ×6 IMPLANT
GOWN STRL REUS W/ TWL LRG LVL3 (GOWN DISPOSABLE) ×1 IMPLANT
GOWN STRL REUS W/TWL 2XL LVL3 (GOWN DISPOSABLE) ×2 IMPLANT
GOWN STRL REUS W/TWL LRG LVL3 (GOWN DISPOSABLE) ×1
GOWN STRL REUS W/TWL XL LVL3 (GOWN DISPOSABLE) ×4 IMPLANT
HANDPIECE INTERPULSE COAX TIP (DISPOSABLE) ×2
IMMOBILIZER KNEE 20 (SOFTGOODS) ×2
IMMOBILIZER KNEE 20 THIGH 36 (SOFTGOODS) ×1 IMPLANT
MANIFOLD NEPTUNE II (INSTRUMENTS) ×2 IMPLANT
PACK TOTAL KNEE CUSTOM (KITS) ×2 IMPLANT
POSITIONER SURGICAL ARM (MISCELLANEOUS) ×2 IMPLANT
SET HNDPC FAN SPRY TIP SCT (DISPOSABLE) ×1 IMPLANT
SPONGE GAUZE 2X2 STER 10/PKG (GAUZE/BANDAGES/DRESSINGS)
SPONGE SURGIFOAM ABS GEL 100 (HEMOSTASIS) IMPLANT
STAPLER VISISTAT (STAPLE) ×2 IMPLANT
STRIP CLOSURE SKIN 1/2X4 (GAUZE/BANDAGES/DRESSINGS) IMPLANT
SUT BONE WAX W31G (SUTURE) IMPLANT
SUT MNCRL AB 4-0 PS2 18 (SUTURE) IMPLANT
SUT VIC AB 1 CT1 27 (SUTURE) ×4
SUT VIC AB 1 CT1 27XBRD ANTBC (SUTURE) ×2 IMPLANT
SUT VIC AB 2-0 CT1 27 (SUTURE) ×3
SUT VIC AB 2-0 CT1 TAPERPNT 27 (SUTURE) ×3 IMPLANT
SUT VLOC 180 0 24IN GS25 (SUTURE) ×2 IMPLANT
SYR 50ML LL SCALE MARK (SYRINGE) ×2 IMPLANT
TOWER CARTRIDGE SMART MIX (DISPOSABLE) ×2 IMPLANT
TRAY FOLEY W/METER SILVER 16FR (SET/KITS/TRAYS/PACK) ×2 IMPLANT
WRAP KNEE MAXI GEL POST OP (GAUZE/BANDAGES/DRESSINGS) ×2 IMPLANT
YANKAUER SUCT BULB TIP 10FT TU (MISCELLANEOUS) ×2 IMPLANT

## 2015-07-12 NOTE — H&P (Signed)
TOTAL KNEE ADMISSION H&P  Patient is being admitted for right total knee arthroplasty.  Subjective:  Chief Complaint:right knee pain.  HPI: Brian [name], 75 y.o. male, has a history of pain and functional disability in the right knee due to arthritis and has failed non-surgical conservative treatments for greater than 12 weeks to includeNSAID's and/or analgesics and corticosteriod injections.  Onset of symptoms was gradual, starting 5 years ago with rapidlly worsening course since that time. The patient noted no past surgery on the right knee(s).  Patient currently rates pain in the right knee(s) at 9 out of 10 with activity. Patient has worsening of pain with activity and weight bearing.  Patient has evidence of subchondral sclerosis and joint space narrowing by imaging studies. This patient has had arthroscopy. There is no active infection.  Patient Active Problem List   Diagnosis Date Noted  . Primary osteoarthritis of left knee 11/30/2014  . Encounter for therapeutic drug monitoring 06/21/2013  . Edema extremities 02/04/2013  . Chronic anticoagulation 02/04/2013  . GERD (gastroesophageal reflux disease)   . Restrictive lung disease   . CKD (chronic kidney disease) stage 2, GFR 60-89 ml/min   . Cellulitis   . Osteoarthritis   . Allergic rhinitis   . Anxiety   . Long term (current) use of anticoagulants 01/25/2013  . Parkinsonism ([employer]) 06/22/2012  . Paresthesias 06/22/2012  . Atrial fibrillation ([place]) 10/14/2010  . PULMONARY NODULE 06/02/2008  . SNORING 06/02/2008  . Hyperlipidemia 05/15/2008  . Essential hypertension 05/15/2008  . PULMONARY FUNCTION TESTS, ABNORMAL 05/15/2008   Past Medical History  Diagnosis Date  . Dyslipidemia   . Mitral regurgitation     Mild  . Abnormal PFT 1. 05/18/08  2. 11/30/08    1. Showed mild airflow obstruction, mild restriction, mild diffusion defect; FEV1 2.22(64%), FVC 3.33(65%), FEVi% 67, TLC 5.19(69%), DLCO 77%, +BD  2. FEV1 2.38(73%), FVC  3.81(80%), FEV1% 63, TLC 5.61(80%), DLCO 79%, no BD  . Parkinsonism ([place]) 06/22/2012  . Paresthesias 06/22/2012  . Hyperlipidemia   . GERD (gastroesophageal reflux disease)   . Erectile dysfunction   . BPH (benign prostatic hyperplasia)   . Restrictive lung disease   . Cellulitis     right leg MRSA  . Osteoarthritis   . Allergic rhinitis   . Anxiety   . Hypertension   . Atrial fibrillation ([facility])     persistent afib  . Heart murmur     as a teenager   . Dysrhythmia     A FIB    Past Surgical History  Procedure Laterality Date  . Cataract extraction    . Cardioversion      multiple  . Cardiac catheterization  11/2009    normal coronary arteries  . Colonoscopy    . Total knee arthroplasty Left 11/30/2014    Procedure: TOTAL KNEE ARTHROPLASTY;  Surgeon: Susa Day, MD;  Location: [facility] ORS;  Service: Orthopedics;  Laterality: Left;    Prescriptions prior to admission  Medication Sig Dispense Refill Last Dose  . amLODipine (NORVASC) 10 MG tablet Take 10 mg by mouth every morning. @@0600    07/12/2015 at Unknown time  . BYSTOLIC 10 MG tablet Take 10 mg by mouth every morning. @0600    07/12/2015 at 0415  . carbidopa-levodopa (SINEMET CR) 50-200 MG tablet Take 1 tablet by mouth at bedtime. (Patient taking differently: Take 1 tablet by mouth at bedtime. @2100  scheduled.) 90 tablet 3 07/11/2015 at Unknown time  . carbidopa-levodopa (SINEMET IR) 25-100 MG tablet Take  2 tabs at 7am and 3pm. Take 1.5 tabs at 11am and 7pm. 540 tablet 3 07/12/2015 at 0415  . Cholecalciferol (VITAMIN D3) 1000 UNITS CAPS Take 1,000 Units by mouth daily. @0700    Past Week at Unknown time  . clonazePAM (KLONOPIN) 0.5 MG disintegrating tablet Take 1 tablet (0.5 mg total) by mouth at bedtime. 90 tablet 3 07/11/2015 at 2030  . docusate sodium (COLACE) 100 MG capsule Take 1 capsule (100 mg total) by mouth every 12 (twelve) hours. 60 capsule 0 07/11/2015 at Unknown time  . doxazosin (CARDURA) 2 MG tablet TAKE THREE TABLETS BY  MOUTH ONCE DAILY. (Patient taking differently: 1 tablet by mouth three times daily.  @700 , @1500 , @1900  scheduled) 90 tablet 0 07/12/2015 at 0415  . escitalopram (LEXAPRO) 5 MG tablet Take 1 tablet (5 mg total) by mouth daily. (Patient taking differently: Take 5 mg by mouth at bedtime. @2100  scheduled.) 90 tablet 3 07/11/2015 at 2030  . famotidine (PEPCID) 40 MG tablet Take 40 mg by mouth daily. @0600    07/12/2015 at 0415  . furosemide (LASIX) 20 MG tablet Take 20 mg by mouth 2 (two) times daily. @0700 , @1900    07/11/2015 at Unknown time  . gabapentin (NEURONTIN) 100 MG capsule Take 1 capsule (100 mg total) by mouth 3 (three) times daily. (Patient taking differently: Take 100 mg by mouth 3 (three) times daily. @700 , @1500 , @1900  scheduled.) 270 capsule 3 07/12/2015 at 0415  . ketoconazole (NIZORAL) 2 % cream Apply 2 application topically 2 (two) times a week. Tuesday and Friday   Past Week at Unknown time  . KLOR-CON M10 10 MEQ tablet Take 1 tablet by mouth 2 (two) times daily. @0600 , @1900    07/11/2015 at Unknown time  . Multiple Vitamin (MULTIVITAMIN WITH MINERALS) TABS tablet Take 1 tablet by mouth every morning. @@0600    07/11/2015 at Unknown time  . olmesartan (BENICAR) 40 MG tablet Take 40 mg by mouth every morning. @@0600    07/11/2015 at Unknown time  . pravastatin (PRAVACHOL) 40 MG tablet Take 40 mg by mouth every morning. @0600    07/12/2015 at 0415  . sodium chloride (OCEAN) 0.65 % SOLN nasal spray Place 2 sprays into both nostrils 4 (four) times daily as needed for congestion.   Past Week at Unknown time  . warfarin (COUMADIN) 5 MG tablet Take as directed by  Coumadin clinic (Patient taking differently: Take 5 mg by mouth daily at 3 pm. Take as directed by  Coumadin clinic) 135 tablet 1 07/06/2015  . HYDROcodone-acetaminophen (NORCO/VICODIN) 5-325 MG per tablet Take 1-2 tablets by mouth every 4 (four) hours as needed for moderate pain. (Patient not taking: Reported on 06/28/2015) 80 tablet 0 Taking  .  ondansetron (ZOFRAN ODT) 4 MG disintegrating tablet 4mg  ODT q6 hours prn nausea/vomit 15 tablet 0 Unknown at Unknown time  . polyethylene glycol (MIRALAX / GLYCOLAX) packet Take 17 g by mouth daily.   Unknown at Unknown time  . traMADol (ULTRAM) 50 MG tablet Take 1-2 tablets (50-100 mg total) by mouth every 6 (six) hours as needed (mild pain). 80 tablet 1 Unknown at Unknown time   Allergies  Allergen Reactions  . Lisinopril Other (See Comments)    coughing    Social History  Substance Use Topics  . Smoking status: Never Smoker   . Smokeless tobacco: Never Used  . Alcohol Use: No    Family History  Problem Relation Age of Onset  . Diabetes Mother     DM  . Stroke Father  CVA  . Pancreatic cancer Brother   . Pancreatic cancer Other     Nephew     Review of Systems  Musculoskeletal: Positive for joint pain.  All other systems reviewed and are negative.   Objective:  Physical Exam  Vitals reviewed. Constitutional: He is oriented to person, place, and time. He appears well-developed.  HENT:  Head: Normocephalic.  Eyes: Pupils are equal, round, and reactive to light.  Neck: Normal range of motion.  Cardiovascular: Normal rate.   Respiratory: Effort normal.  GI: Soft.  Musculoskeletal:  MJLT. Effusion right knee. No DVT. ROM -10 to 90  Neurological: He is alert and oriented to person, place, and time.  Skin: Skin is warm and dry.  Psychiatric: He has a normal mood and affect.    Vital signs in last 24 hours: Temp:  [98.6 F (37 C)] 98.6 F (37 C) (04/20 0536) Pulse Rate:  [69] 69 (04/20 0536) Resp:  [18] 18 (04/20 0536) BP: (131)/(84) 131/84 mmHg (04/20 0536) SpO2:  [100 %] 100 % (04/20 0536) Weight:  [111.131 kg (245 lb)] 111.131 kg (245 lb) (04/20 0645)  Labs:   Estimated body mass index is 31.44 kg/(m^2) as calculated from the following:   Height as of this encounter: 6\' 2"  (1.88 m).   Weight as of this encounter: 111.131 kg (245 lb).   Imaging  Review Plain radiographs demonstrate severe degenerative joint disease of the right knee(s). The overall alignment ismild varus. The bone quality appears to be good for age and reported activity level.  Assessment/Plan:  End stage arthritis, right knee   The patient history, physical examination, clinical judgment of the provider and imaging studies are consistent with end stage degenerative joint disease of the right knee(s) and total knee arthroplasty is deemed medically necessary. The treatment options including medical management, injection therapy arthroscopy and arthroplasty were discussed at length. The risks and benefits of total knee arthroplasty were presented and reviewed. The risks due to aseptic loosening, infection, stiffness, patella tracking problems, thromboembolic complications and other imponderables were discussed. The patient acknowledged the explanation, agreed to proceed with the plan and consent was signed. Patient is being admitted for inpatient treatment for surgery, pain control, PT, OT, prophylactic antibiotics, VTE prophylaxis, progressive ambulation and ADL's and discharge planning. The patient is planning to be discharged to skilled nursing facility

## 2015-07-12 NOTE — Transfer of Care (Signed)
Immediate Anesthesia Transfer of Care Note  Patient: Brian [name]  Procedure(s) Performed: Procedure(s): RIGHT TOTAL KNEE ARTHROPLASTY (Right)  Patient Location: PACU  Anesthesia Type:GA combined with regional for post-op pain  Level of Consciousness:  sedated, patient cooperative and responds to stimulation  Airway & Oxygen Therapy:Patient Spontanous Breathing and Patient connected to face mask oxgen  Post-op Assessment:  Report given to PACU RN and Post -op Vital signs reviewed and stable  Post vital signs:  Reviewed and stable  Last Vitals:  Filed Vitals:   07/12/15 0536  BP: 131/84  Pulse: 69  Temp: 37 C  Resp: 18    Complications: No apparent anesthesia complications

## 2015-07-12 NOTE — Anesthesia Postprocedure Evaluation (Signed)
Anesthesia Post Note  Patient: Brian [name]  Procedure(s) Performed: Procedure(s) (LRB): RIGHT TOTAL KNEE ARTHROPLASTY (Right)  Patient location during evaluation: PACU Anesthesia Type: General and Regional Level of consciousness: awake and alert Pain management: pain level controlled Vital Signs Assessment: post-procedure vital signs reviewed and stable Respiratory status: spontaneous breathing, nonlabored ventilation, respiratory function stable and patient connected to nasal cannula oxygen Cardiovascular status: blood pressure returned to baseline and stable Postop Assessment: no signs of nausea or vomiting and patient able to bend at knees Anesthetic complications: no    Last Vitals:  Filed Vitals:   07/12/15 1100 07/12/15 1121  BP: 159/77 132/76  Pulse: 86 86  Temp: 36.8 C 36.9 C  Resp: 10 12    Last Pain:  Filed Vitals:   07/12/15 1122  PainSc: 0-No pain                 Rual Vermeer J

## 2015-07-12 NOTE — Discharge Instructions (Signed)
Elevate leg above heart 6x a day for 20minutes each °Use knee immobilizer while walking until can SLR x 10 °Use knee immobilizer in bed to keep knee in extension °Aquacel dressing may remain in place until follow up. May shower with aquacel dressing in place. If the dressing becomes saturated or peels off, you may remove aquacel dressing. Do not remove steri-strips if they are present. Place new dressing with gauze and tape or ACE bandage which should be kept clean and dry and changed daily. ° °INSTRUCTIONS AFTER JOINT REPLACEMENT  ° °o Remove items at home which could result in a fall. This includes throw rugs or furniture in walking pathways °o ICE to the affected joint every three hours while awake for 30 minutes at a time, for at least the first 3-5 days, and then as needed for pain and swelling.  Continue to use ice for pain and swelling. You may notice swelling that will progress down to the foot and ankle.  This is normal after surgery.  Elevate your leg when you are not up walking on it.   °o Continue to use the breathing machine you got in the hospital (incentive spirometer) which will help keep your temperature down.  It is common for your temperature to cycle up and down following surgery, especially at night when you are not up moving around and exerting yourself.  The breathing machine keeps your lungs expanded and your temperature down. ° ° °DIET:  As you were doing prior to hospitalization, we recommend a well-balanced diet. ° °DRESSING / WOUND CARE / SHOWERING ° °Keep the surgical dressing until follow up.  The dressing is water proof, so you can shower without any extra covering.  IF THE DRESSING FALLS OFF or the wound gets wet inside, change the dressing with sterile gauze.  Please use good hand washing techniques before changing the dressing.  Do not use any lotions or creams on the incision until instructed by your surgeon.   ° °ACTIVITY ° °o Increase activity slowly as tolerated, but follow the  weight bearing instructions below.   °o No driving for 6 weeks or until further direction given by your physician.  You cannot drive while taking narcotics.  °o No lifting or carrying greater than 10 lbs. until further directed by your surgeon. °o Avoid periods of inactivity such as sitting longer than an hour when not asleep. This helps prevent blood clots.  °o You may return to work once you are authorized by your doctor.  ° ° ° °WEIGHT BEARING  ° °Weight bearing as tolerated with assist device (walker, cane, etc) as directed, use it as long as suggested by your surgeon or therapist, typically at least 4-6 weeks. ° ° °EXERCISES ° °Results after joint replacement surgery are often greatly improved when you follow the exercise, range of motion and muscle strengthening exercises prescribed by your doctor. Safety measures are also important to protect the joint from further injury. Any time any of these exercises cause you to have increased pain or swelling, decrease what you are doing until you are comfortable again and then slowly increase them. If you have problems or questions, call your caregiver or physical therapist for advice.  ° °Rehabilitation is important following a joint replacement. After just a few days of immobilization, the muscles of the leg can become weakened and shrink (atrophy).  These exercises are designed to build up the tone and strength of the thigh and leg muscles and to improve motion. Often   times heat used for twenty to thirty minutes before working out will loosen up your tissues and help with improving the range of motion but do not use heat for the first two weeks following surgery (sometimes heat can increase post-operative swelling).  ° °These exercises can be done on a training (exercise) mat, on the floor, on a table or on a bed. Use whatever works the best and is most comfortable for you.    Use music or television while you are exercising so that the exercises are a pleasant  break in your day. This will make your life better with the exercises acting as a break in your routine that you can look forward to.   Perform all exercises about fifteen times, three times per day or as directed.  You should exercise both the operative leg and the other leg as well. ° °Exercises include: °  °• Quad Sets - Tighten up the muscle on the front of the thigh (Quad) and hold for 5-10 seconds.   °• Straight Leg Raises - With your knee straight (if you were given a brace, keep it on), lift the leg to 60 degrees, hold for 3 seconds, and slowly lower the leg.  Perform this exercise against resistance later as your leg gets stronger.  °• Leg Slides: Lying on your back, slowly slide your foot toward your buttocks, bending your knee up off the floor (only go as far as is comfortable). Then slowly slide your foot back down until your leg is flat on the floor again.  °• Angel Wings: Lying on your back spread your legs to the side as far apart as you can without causing discomfort.  °• Hamstring Strength:  Lying on your back, push your heel against the floor with your leg straight by tightening up the muscles of your buttocks.  Repeat, but this time bend your knee to a comfortable angle, and push your heel against the floor.  You may put a pillow under the heel to make it more comfortable if necessary.  ° °A rehabilitation program following joint replacement surgery can speed recovery and prevent re-injury in the future due to weakened muscles. Contact your doctor or a physical therapist for more information on knee rehabilitation.  ° ° °CONSTIPATION ° °Constipation is defined medically as fewer than three stools per week and severe constipation as less than one stool per week.  Even if you have a regular bowel pattern at home, your normal regimen is likely to be disrupted due to multiple reasons following surgery.  Combination of anesthesia, postoperative narcotics, change in appetite and fluid intake all can  affect your bowels.  ° °YOU MUST use at least one of the following options; they are listed in order of increasing strength to get the job done.  They are all available over the counter, and you may need to use some, POSSIBLY even all of these options:   ° °Drink plenty of fluids (prune juice may be helpful) and high fiber foods °Colace 100 mg by mouth twice a day  °Senokot for constipation as directed and as needed Dulcolax (bisacodyl), take with full glass of water  °Miralax (polyethylene glycol) once or twice a day as needed. ° °If you have tried all these things and are unable to have a bowel movement in the first 3-4 days after surgery call either your surgeon or your primary doctor.   ° °If you experience loose stools or diarrhea, hold the medications until you stool   forms back up.  If your symptoms do not get better within 1 week or if they get worse, check with your doctor.  If you experience "the worst abdominal pain ever" or develop nausea or vomiting, please contact the office immediately for further recommendations for treatment. ° ° °ITCHING:  If you experience itching with your medications, try taking only a single pain pill, or even half a pain pill at a time.  You can also use Benadryl over the counter for itching or also to help with sleep.  ° °TED HOSE STOCKINGS:  Use stockings on both legs until for at least 2 weeks or as directed by physician office. They may be removed at night for sleeping. ° °MEDICATIONS:  See your medication summary on the “After Visit Summary” that nursing will review with you.  You may have some home medications which will be placed on hold until you complete the course of blood thinner medication.  It is important for you to complete the blood thinner medication as prescribed. ° °PRECAUTIONS:  If you experience chest pain or shortness of breath - call 911 immediately for transfer to the hospital emergency department.  ° °If you develop a fever greater that 101 F, purulent  drainage from wound, increased redness or drainage from wound, foul odor from the wound/dressing, or calf pain - CONTACT YOUR SURGEON.   °                                                °FOLLOW-UP APPOINTMENTS:  If you do not already have a post-op appointment, please call the office for an appointment to be seen by your surgeon.  Guidelines for how soon to be seen are listed in your “After Visit Summary”, but are typically between 1-4 weeks after surgery. ° °OTHER INSTRUCTIONS:  ° °Knee Replacement:  Do not place pillow under knee, focus on keeping the knee straight while resting. CPM instructions: 0-90 degrees, 2 hours in the morning, 2 hours in the afternoon, and 2 hours in the evening. Place foam block, curve side up under heel at all times except when in CPM or when walking.  DO NOT modify, tear, cut, or change the foam block in any way. ° °MAKE SURE YOU:  °• Understand these instructions.  °• Get help right away if you are not doing well or get worse.  ° ° °Thank you for letting us be a part of your medical care team.  It is a privilege we respect greatly.  We hope these instructions will help you stay on track for a fast and full recovery!  ° ° °

## 2015-07-12 NOTE — Op Note (Signed)
NAMEORLAN, RYCHLIK NO.:  [id number]  MEDICAL RECORD NO.:  [id number]  LOCATION:  [facility]                         FACILITY:  [facility]  PHYSICIAN:  Susa Day, M.D.    DATE OF BIRTH:  February 09, 1941  DATE OF PROCEDURE:  07/12/2015 DATE OF DISCHARGE:                              OPERATIVE REPORT   PREOPERATIVE DIAGNOSIS:  End-stage osteoarthrosis, right knee.  POSTOPERATIVE DIAGNOSIS:  End-stage osteoarthrosis, right knee.  PROCEDURE PERFORMED:  Right total knee arthroplasty utilizing DePuy Attune rotating platform, 7 femur, 7 tibia, 38 patella, 5 mm insert.  ASSISTANT:  Lacie Draft, PA  HISTORY:  A 75 year old with history of Parkinson's, bone-on-bone arthrosis severely affecting his mobilization, was indicated for replacement of the degenerated joint.  Risks and benefits were discussed including bleeding, infection, damage to neurovascular structures, DVT, PE, anesthetic complications, etc.  TECHNIQUE:  Patient in supine position, after induction of adequate general anesthesia, 2 g Kefzol, 900 clindamycin, right lower extremity was prepped, draped, and exsanguinated in usual sterile fashion.  Thigh tourniquet inflated to 275 mmHg.  Midline incision was then made over the knee.  Full-thickness flaps developed.  Median parapatellar arthrotomy performed.  Clear synovial fluid evacuated.  Patellae everted.  Knee flexed.  Tricompartmental osteoarthrosis was noted particularly over the lateral compartment, patellofemoral joint, severe mottling, and deformity of the patella was noted.  Then the knee was noted to be fairly tight.  We used a step drill to enter the femoral canal, irrigated.  We used a 5 degree right 10 off the distal femur due to slight flexion contracture.  This was then pinned and then we performed our distal femoral cut.  We were unable to sublux the tibia and therefore sized the femur, sized off the anterior cortex to a 7 and pinned and 3 degrees  of external rotation.  The soft tissue was protected posteriorly.  We performed anterior and posterior chamfer cuts followed by our box cut bisecting the canal.  This was flushed laterally.  The box cut jig was then applied and we performed our box cut without difficulty.  Then attention was turned towards the tibia and with the knee flexed, we were able to remove the remnants of the medial and lateral menisci and the ACL remnant as well as any attachment of the PCL protecting the soft tissues at all times.  We subluxed the tibia, maximized it at 7, put our template, external alignment guide bisecting tibiotalar joint, 4 off the defect, which is medial, parallel to the shaft.  This was then pinned.  3 degree slope oscillating saw performed the cut.  Soft tissue was protected.  We then completed the tibia by using the 7 tray just to the medial aspect of tibial tubercle maximizing the coverage, pinned it, drilled it, used our punch guide.  We then placed a trial femur, tibia, used a 6 mm insert, slightly tight in extension, good in flexion.  We turned our attention then towards the patella, again it was saucerized, significantly worn.  We removed osteophytes with a rongeur using a patellar guide and planed and measured it at 24, planed it with a saw, measured again, it was more  of an 44.  We then planed additional 2 mm off that.  Drilled our PEG holes medializing that after using a 38 trial patella in terms of the restoring the thickness.  We had then excellent patellofemoral tracking. We then with excellent stability, varus valgus stressing 0-30 degrees, negative anterior drawer, trials were removed.  We checked posteriorly, and meticulously removed osteophytes of the capsule and popliteus were noted to be intact.  Cauterized geniculates.  Pulsatile lavage to lavage the joint.  Knee flexed.  Tibia subluxed.  Thoroughly dried all surfaces, mixed cement on back table in appropriate fashion,  injected it under pressure into the tibial canal.  Impacted our tibial tray, redundant cement removed.  We cemented the femur, impacted, redundant cement removed after drilling the lug holes.  We then placed a trial of 5 insert, reduced it, held on axial load throughout the curing of the cement.  Redundant cement removed and cemented patellar button at 38, clamped it, irrigated the wound using Marcaine with epinephrine in the periarticular tissues.  Bone wax on the free cancellous surfaces. Allowed full curing of the cement, then meticulously removed all cement. We had excellent patellofemoral tracking, instability prior to that.  We then copiously irrigated the wound with pulsatile antibiotic irrigation, selected 5 rotating platform insert, reduced it and had full extension, full flexion 140.  Excellent stability, varus valgus stressing 0-30 degrees.  Negative anterior drawer.  Copiously irrigated the wound once again.  Tourniquet was deflated at 90 minutes.  There was some minor bleeding, cauterized any areas.  Placed a Hemovac and brought out through lateral stab wound of skin.  We then reapproximated the patellar arthrotomy after placing Gelfoam in the femoral canal with the knee in slight flexion.  We reapproximated with #1 Vicryl interrupted figure-of- eight sutures in a running V-Loc.  Knee flexed fully to 140 and then straight and excellent patellofemoral tracking following this.  We checked the Hemovac and it did slide without incarceration.  Subcu with 2-0 and skin with staples wound was dressed sterilely, placed in immobilizer, extubated without difficulty, and transported to the recovery room in satisfactory condition.  The patient tolerated the procedure well.  No complications.  Assistant, Lacie Draft, [place].  Minimal blood loss.     Susa Day, M.D.     Geralynn Rile  D:  07/12/2015  T:  07/12/2015  Job:  [id number]

## 2015-07-12 NOTE — Evaluation (Signed)
Physical Therapy Evaluation Patient Details Name: TRESHAWN PLEVA MRN: [id number] DOB: 1940/12/18 Today's Date: 07/12/2015   History of Present Illness  R TKR; hx of L TKR 9/16, and Parkinsons  Clinical Impression  Pt s/p R TKR presents with decreased R LE strength/ROM, post op pain and affects of Parkinsons limiting functional mobility.  Pt would benefit from follow up rehab at SNF level to maximize safety and IND for return home with spouse.    Follow Up Recommendations SNF    Equipment Recommendations  None recommended by PT    Recommendations for Other Services OT consult     Precautions / Restrictions Precautions Precautions: Knee;Fall Required Braces or Orthoses: Knee Immobilizer - Right Knee Immobilizer - Right: Discontinue once straight leg raise with < 10 degree lag Restrictions Weight Bearing Restrictions: No      Mobility  Bed Mobility Overal bed mobility: Needs Assistance;+2 for physical assistance Bed Mobility: Supine to Sit     Supine to sit: Min assist;Mod assist;+2 for physical assistance     General bed mobility comments: cues for sequence and use of L LE to self assist.  Delayed response  ? Parkinsons  Transfers Overall transfer level: Needs assistance Equipment used: Rolling walker (2 wheeled) Transfers: Sit to/from Stand Sit to Stand: Mod assist;+2 physical assistance;+2 safety/equipment;From elevated surface         General transfer comment: Cues for LE management and use of UEs to self assist.  Physical assist to bring wt up and fwd and to maintain balance on initial standing.  Ambulation/Gait Ambulation/Gait assistance: Min assist;Mod assist;+2 physical assistance;+2 safety/equipment Ambulation Distance (Feet): 4 Feet Assistive device: Rolling walker (2 wheeled) Gait Pattern/deviations: Step-to pattern;Decreased step length - right;Decreased step length - left;Shuffle;Trunk flexed Gait velocity: decr   General Gait Details: cues for  sequence, posture, position from RW.  Physical assist for balance, support and correction of post lean  Stairs            Wheelchair Mobility    Modified Rankin (Stroke Patients Only)       Balance Overall balance assessment: Needs assistance Sitting-balance support: Feet supported;Bilateral upper extremity supported Sitting balance-Leahy Scale: Fair   Postural control: Posterior lean Standing balance support: Bilateral upper extremity supported Standing balance-Leahy Scale: Poor                               Pertinent Vitals/Pain Pain Assessment: Faces Faces Pain Scale: Hurts a little bit Pain Location: R knee Pain Descriptors / Indicators: Sore Pain Intervention(s): Limited activity within patient's tolerance;Monitored during session;Premedicated before session;Ice applied    Home Living Family/patient expects to be discharged to:: Skilled nursing facility                      Prior Function Level of Independence: Independent               Hand Dominance        Extremity/Trunk Assessment   Upper Extremity Assessment: RUE deficits/detail;LUE deficits/detail           Lower Extremity Assessment: RLE deficits/detail;LLE deficits/detail RLE Deficits / Details: 2/5 quads       Communication   Communication: Other (comment) (Delayed response - ? Parkinsons)  Cognition Arousal/Alertness: Awake/alert Behavior During Therapy: WFL for tasks assessed/performed Overall Cognitive Status: Within Functional Limits for tasks assessed  General Comments      Exercises        Assessment/Plan    PT Assessment Patient needs continued PT services  PT Diagnosis Difficulty walking   PT Problem List Decreased strength;Decreased range of motion;Decreased activity tolerance;Decreased mobility;Decreased knowledge of use of DME;Pain  PT Treatment Interventions Gait training;DME instruction;Functional mobility  training;Therapeutic activities;Therapeutic exercise;Patient/family education   PT Goals (Current goals can be found in the Care Plan section) Acute Rehab PT Goals Patient Stated Goal: Regain IND PT Goal Formulation: With patient Time For Goal Achievement: 07/19/15 Potential to Achieve Goals: Fair    Frequency 7X/week   Barriers to discharge        Co-evaluation               End of Session Equipment Utilized During Treatment: Gait belt;Right knee immobilizer Activity Tolerance: Patient tolerated treatment well;Patient limited by fatigue Patient left: in chair;with call bell/phone within reach;with family/visitor present Nurse Communication: Mobility status         Time: [id number] PT Time Calculation (min) (ACUTE ONLY): 28 min   Charges:   PT Evaluation $PT Eval Low Complexity: 1 Procedure PT Treatments $Gait Training: 8-22 mins   PT G Codes:        Oswin Johal 2015-07-31, 4:35 PM

## 2015-07-12 NOTE — Progress Notes (Signed)
ANTICOAGULATION CONSULT NOTE - Initial Consult  Pharmacy Consult for Warfarin Indication: atrial fibrillation and VTE prophylaxis  Allergies  Allergen Reactions  . Lisinopril Other (See Comments)    coughing    Patient Measurements: Height: 6\' 2"  (188 cm) Weight: 245 lb (111.131 kg) IBW/kg (Calculated) : 82.2  Vital Signs: Temp: 98.4 F (36.9 C) (04/20 1121) Temp Source: Oral (04/20 0536) BP: 132/76 mmHg (04/20 1121) Pulse Rate: 86 (04/20 1121)  Labs:  Recent Labs  07/12/15 0610  APTT 29  LABPROT 16.6*  INR 1.38    Estimated Creatinine Clearance: 75.2 mL/min (by C-G formula based on Cr of 1.16).   Medical History: Past Medical History  Diagnosis Date  . Dyslipidemia   . Mitral regurgitation     Mild  . Abnormal PFT 1. 05/18/08  2. 11/30/08    1. Showed mild airflow obstruction, mild restriction, mild diffusion defect; FEV1 2.22(64%), FVC 3.33(65%), FEVi% 67, TLC 5.19(69%), DLCO 77%, +BD  2. FEV1 2.38(73%), FVC 3.81(80%), FEV1% 63, TLC 5.61(80%), DLCO 79%, no BD  . Parkinsonism ([place]) 06/22/2012  . Paresthesias 06/22/2012  . Hyperlipidemia   . GERD (gastroesophageal reflux disease)   . Erectile dysfunction   . BPH (benign prostatic hyperplasia)   . Restrictive lung disease   . Cellulitis     right leg MRSA  . Osteoarthritis   . Allergic rhinitis   . Anxiety   . Hypertension   . Atrial fibrillation ([facility])     persistent afib  . Heart murmur     as a teenager   . Dysrhythmia     A FIB    Assessment: 89 yoM on chronic warfarin PTA for atrial fibrillation, s/p right TKA 4/20.  Warfarin held prior to procedure, last dose 4/14.  Pharmacy consulted to resume warfarin POD0 at 3pm (typical time patient takes warfarin at home).  PTA warfarin dose is 5 mg daily except takes 7.5 mg on Wednesdays per last anticoag clinic notes.  Today, 07/12/2015: - INR 1.38 - Last CBC 4/13 showed Hgb 13.1, Plts 149. Daily CBC starting 4/21. - Lovenox 40 mg SQ q12h per ortho to start  AM of POD1 until INR>/= 1.8 - CrCl~75 ml/min  Goal of Therapy:  INR 2-3   Plan:  Warfarin 5 mg today. Daily INR. F/u CBC.  Hershal Coria 07/12/2015,11:55 AM

## 2015-07-12 NOTE — Consult Note (Signed)
[facility] Medical Consultation  Brian [name] [id number] DOB: 1940-09-06 DOA: 07/12/2015 PCP: Wenda Low, MD   Requesting physician: Susa Day, MD Date of consultation: 07/11/2068 Reason for consultation: Medical management.  Impression/Recommendations Principal Problem:   Primary osteoarthritis of right knee   S/P Right TKA Continue postop care as per Dr. Tonita Cong. Analgesics as needed.  Active Problems:   Hyperlipidemia Continue pravastatin 40 mg by mouth daily. Monitor LFTs periodically.    Essential hypertension Continue amlodipine 10 mg by mouth daily. Continue Bystolic 10 mg by mouth daily. Continue Cardura 2 mg by mouth daily. Continue furosemide 20 mg by mouth daily. Continue Benicar 40 mg by mouth daily. Monitor her blood pressure periodically.    Atrial fibrillation ([place]) Continue Bystolic for rate control. Continue warfarin for anticoagulation.    Parkinsonism (HCC) Continue Sinemet. Follow-up with neurology as scheduled early next month.    GERD (gastroesophageal reflux disease) Continue famotidine 40 mg by mouth daily.   I will followup again tomorrow. Please contact me if I can be of assistance in the meanwhile. Thank you for this consultation.  Chief Complaint: Medical management.  HPI:  75 year old male with a past medical history of osteoarthritis, status post left TKA, GERD, CK-MB, chronic atrial fibrillation on chronic anticoagulation with warfarin, hyperlipidemia, restrictive lung disease, Parkinson's disease, anxiety, depression, hypertension who just underwent a right TKA by Dr. Susa Day.  Patient is currently resting in bed in no acute distress. He denies headache, chest pain, dyspnea, abdominal pain or GU symptoms.  Review of Systems:  As per HPI otherwise 10 point review of systems negative.   Past Medical History  Diagnosis Date  . Dyslipidemia   . Mitral regurgitation     Mild  . Abnormal PFT 1. 05/18/08  2.  11/30/08    1. Showed mild airflow obstruction, mild restriction, mild diffusion defect; FEV1 2.22(64%), FVC 3.33(65%), FEVi% 67, TLC 5.19(69%), DLCO 77%, +BD  2. FEV1 2.38(73%), FVC 3.81(80%), FEV1% 63, TLC 5.61(80%), DLCO 79%, no BD  . Parkinsonism ([place]) 06/22/2012  . Paresthesias 06/22/2012  . Hyperlipidemia   . GERD (gastroesophageal reflux disease)   . Erectile dysfunction   . BPH (benign prostatic hyperplasia)   . Restrictive lung disease   . Cellulitis     right leg MRSA  . Osteoarthritis   . Allergic rhinitis   . Anxiety   . Hypertension   . Atrial fibrillation (HCC)     persistent afib  . Heart murmur     as a teenager   . Dysrhythmia     A FIB   Past Surgical History  Procedure Laterality Date  . Cataract extraction    . Cardioversion      multiple  . Cardiac catheterization  11/2009    normal coronary arteries  . Colonoscopy    . Total knee arthroplasty Left 11/30/2014    Procedure: TOTAL KNEE ARTHROPLASTY;  Surgeon: Susa Day, MD;  Location: [facility];  Service: Orthopedics;  Laterality: Left;  . Total knee arthroplasty Right 07/12/2015    Procedure: RIGHT TOTAL KNEE ARTHROPLASTY;  Surgeon: Susa Day, MD;  Location: [facility];  Service: Orthopedics;  Laterality: Right;   Social History:  reports that he has never smoked. He has never used smokeless tobacco. He reports that he does not drink alcohol or use illicit drugs.  Allergies  Allergen Reactions  . Lisinopril Other (See Comments)    coughing   Family History  Problem Relation Age of Onset  . Diabetes Mother  DM  . Stroke Father     CVA  . Pancreatic cancer Brother   . Pancreatic cancer Other     Nephew    Prior to Admission medications   Medication Sig Start Date End Date Taking? Authorizing Provider  amLODipine (NORVASC) 10 MG tablet Take 10 mg by mouth every morning. @@0600    Yes Historical Provider, MD  BYSTOLIC 10 MG tablet Take 10 mg by mouth every morning. @0600  08/07/14  Yes Historical  Provider, MD  carbidopa-levodopa (SINEMET CR) 50-200 MG tablet Take 1 tablet by mouth at bedtime. Patient taking differently: Take 1 tablet by mouth at bedtime. @2100  scheduled. 04/26/15  Yes Star Age, MD  carbidopa-levodopa (SINEMET IR) 25-100 MG tablet Take 2 tabs at 7am and 3pm. Take 1.5 tabs at 11am and 7pm. 05/03/15  Yes Star Age, MD  Cholecalciferol (VITAMIN D3) 1000 UNITS CAPS Take 1,000 Units by mouth daily. @0700    Yes Historical Provider, MD  clonazePAM (KLONOPIN) 0.5 MG disintegrating tablet Take 1 tablet (0.5 mg total) by mouth at bedtime. 04/26/15  Yes Star Age, MD  docusate sodium (COLACE) 100 MG capsule Take 1 capsule (100 mg total) by mouth every 12 (twelve) hours. 12/16/14  Yes Sherwood Gambler, MD  doxazosin (CARDURA) 2 MG tablet TAKE THREE TABLETS BY MOUTH ONCE DAILY. Patient taking differently: 1 tablet by mouth three times daily.  @700 , @1500 , @1900  scheduled 04/25/14  Yes Sueanne Margarita, MD  escitalopram (LEXAPRO) 5 MG tablet Take 1 tablet (5 mg total) by mouth daily. Patient taking differently: Take 5 mg by mouth at bedtime. @2100  scheduled. 04/26/15  Yes Star Age, MD  famotidine (PEPCID) 40 MG tablet Take 40 mg by mouth daily. @0600  11/19/12  Yes Historical Provider, MD  furosemide (LASIX) 20 MG tablet Take 20 mg by mouth 2 (two) times daily. @0700 , @1900  05/31/12  Yes Historical Provider, MD  gabapentin (NEURONTIN) 100 MG capsule Take 1 capsule (100 mg total) by mouth 3 (three) times daily. Patient taking differently: Take 100 mg by mouth 3 (three) times daily. @700 , @1500 , @1900  scheduled. 03/27/14  Yes Star Age, MD  ketoconazole (NIZORAL) 2 % cream Apply 2 application topically 2 (two) times a week. Tuesday and Friday 09/18/13  Yes Historical Provider, MD  KLOR-CON M10 10 MEQ tablet Take 1 tablet by mouth 2 (two) times daily. @0600 , @1900  09/05/13  Yes Historical Provider, MD  Multiple Vitamin (MULTIVITAMIN WITH MINERALS) TABS tablet Take 1 tablet by mouth every morning.  @@0600    Yes Historical Provider, MD  olmesartan (BENICAR) 40 MG tablet Take 40 mg by mouth every morning. @@0600    Yes Historical Provider, MD  pravastatin (PRAVACHOL) 40 MG tablet Take 40 mg by mouth every morning. @0600    Yes Historical Provider, MD  sodium chloride (OCEAN) 0.65 % SOLN nasal spray Place 2 sprays into both nostrils 4 (four) times daily as needed for congestion.   Yes Historical Provider, MD  warfarin (COUMADIN) 5 MG tablet Take as directed by  Coumadin clinic Patient taking differently: Take 5 mg by mouth daily at 3 pm. Take as directed by  Coumadin clinic 07/21/14  Yes Sueanne Margarita, MD  docusate sodium (COLACE) 100 MG capsule Take 1 capsule (100 mg total) by mouth 2 (two) times daily. 07/12/15   Cecilie Kicks, PA-C  HYDROcodone-acetaminophen (NORCO) 5-325 MG tablet Take 1-2 tablets by mouth every 6 (six) hours as needed for moderate pain or severe pain. 07/12/15   Cecilie Kicks, PA-C  HYDROcodone-acetaminophen (NORCO/VICODIN) 5-325 MG  per tablet Take 1-2 tablets by mouth every 4 (four) hours as needed for moderate pain. Patient not taking: Reported on 06/28/2015 12/12/14   Lauree Chandler, NP  ondansetron (ZOFRAN ODT) 4 MG disintegrating tablet 4mg  ODT q6 hours prn nausea/vomit 12/16/14   Sherwood Gambler, MD  polyethylene glycol (MIRALAX / GLYCOLAX) packet Take 17 g by mouth daily.    Historical Provider, MD  polyethylene glycol (MIRALAX / GLYCOLAX) packet Take 17 g by mouth daily. 07/12/15   Cecilie Kicks, PA-C  traMADol (ULTRAM) 50 MG tablet Take 1-2 tablets (50-100 mg total) by mouth every 6 (six) hours as needed (mild pain). 12/03/14   Arlee Muslim, PA-C  traMADol (ULTRAM) 50 MG tablet Take 1 tablet (50 mg total) by mouth every 6 (six) hours as needed (mild to moderate pain). 07/12/15   Cecilie Kicks, PA-C    Physical Exam: Blood pressure 136/62, pulse 68, temperature 98.5 F (36.9 C), temperature source Oral, resp. rate 12, height 6\' 2"  (1.88 m), weight 113.399 kg  (250 lb), SpO2 98 %. Filed Vitals:   07/12/15 1832 07/12/15 2137  BP: 134/64 136/62  Pulse: 66 68  Temp: 99.1 F (37.3 C) 98.5 F (36.9 C)  Resp: 11 12     General:  No acute distress.  Eyes: PERRLA, no icterus.  ENT: Oral mucosa is moist, throat was clear.  Neck: Supple, no JVD, no adenopathies.  Cardiovascular: S1 and S2, RRR, no murmurs, no rubs.  Respiratory: Clear to auscultation bilaterally.  Abdomen:Soft, nontender.  Skin: No rashes on limited skin exam.  Musculoskeletal: Normal muscle tone. Dressings in place.  Psychiatric: Mood and behavior are appropriate.  Neurologic: Mild basal tremor, Awake, alert, oriented 4, grossly nonfocal.  Labs on Admission:  Basic Metabolic Panel:  Recent Labs Lab 07/12/15 1229  CREATININE 0.83    Radiological Exams on Admission: Dg Knee 1-2 Views Right  07/12/2015  CLINICAL DATA:  Status post right knee replacement. EXAM: RIGHT KNEE - 1-2 VIEW COMPARISON:  July 05, 2015. FINDINGS: The femoral and tibial components appear to be well situated. Surgical drain is noted in the suprapatellar region. No fracture or dislocation is noted. IMPRESSION: Status post right total knee arthroplasty. These results will be called to the ordering clinician or representative by the Radiologist Assistant, and communication documented in the PACS or zVision Dashboard. Electronically Signed   By: Marijo Conception, M.D.   On: 07/12/2015 10:40     Time spent: About 50 minutes were spent in the process of this admission.  Reubin Milan, M.D [facility] Pager [phone].  If 7PM-7AM, please contact night-coverage [email or web address] Password [email or web address] 07/12/2015, 11:22 PM

## 2015-07-12 NOTE — Anesthesia Procedure Notes (Addendum)
Anesthesia Regional Block:  Adductor canal block  Pre-Anesthetic Checklist: ,, timeout performed, Correct Patient, Correct Site, Correct Laterality, Correct Procedure, Correct Position, site marked, Risks and benefits discussed,  Surgical consent,  Pre-op evaluation,  At surgeon's request and post-op pain management  Laterality: Lower and Right  Prep: chloraprep       Needles:  Injection technique: Single-shot  Needle Type: Echogenic Needle      Needle Gauge: 21 and 21 G    Additional Needles:  Procedures: ultrasound guided (picture in chart) Adductor canal block  Nerve Stimulator or Paresthesia:  Response: 0.6 mA,   Additional Responses:   Narrative:  Start time: 07/12/2015 7:00 AM Injection made incrementally with aspirations every 5 mL.  Performed by: Personally  Anesthesiologist: Franne Grip  Additional Notes: No pain on injection. No increased resistance to injection.    Procedure Name: Intubation Date/Time: 07/12/2015 7:17 AM Performed by: Anne Fu Pre-anesthesia Checklist: Patient identified, Emergency Drugs available, Suction available, Patient being monitored and Timeout performed Patient Re-evaluated:Patient Re-evaluated prior to inductionOxygen Delivery Method: Circle system utilized Preoxygenation: Pre-oxygenation with 100% oxygen Intubation Type: IV induction Ventilation: Mask ventilation without difficulty Laryngoscope Size: Mac and 4 Grade View: Grade III Tube type: Oral Tube size: 8.0 mm Number of attempts: 1 Airway Equipment and Method: Stylet Placement Confirmation: ETT inserted through vocal cords under direct vision,  positive ETCO2,  CO2 detector and breath sounds checked- equal and bilateral Secured at: 23 cm Tube secured with: Tape Dental Injury: Teeth and Oropharynx as per pre-operative assessment  Comments: Grade III view due to large floppy epiglottis coving tracheal opening.

## 2015-07-12 NOTE — Brief Op Note (Signed)
07/12/2015  9:30 AM  PATIENT:  Brian [name]  76 y.o. male  PRE-OPERATIVE DIAGNOSIS:  DEGENERATIVE JOINT DISEASE RIGHT KNEE  POST-OPERATIVE DIAGNOSIS:  DEGENERATIVE JOINT DISEASE RIGHT KNEE  PROCEDURE:  Procedure(s): RIGHT TOTAL KNEE ARTHROPLASTY (Right)  SURGEON:  Surgeon(s) and Role:    * Susa Day, MD - Primary  PHYSICIAN ASSISTANT:   ASSISTANTS: [place]   ANESTHESIA:   general  EBL:  Total I/O In: 1000 [I.V.:1000] Out: 500 [Urine:400; Blood:100]  BLOOD ADMINISTERED:none  DRAINS: none   LOCAL MEDICATIONS USED:  MARCAINE     SPECIMEN:  No Specimen  DISPOSITION OF SPECIMEN:  N/A  COUNTS:  YES  TOURNIQUET:   Total Tourniquet Time Documented: Thigh (Right) - 98 minutes Total: Thigh (Right) - 98 minutes   DICTATION: .Other Dictation: Dictation Number  [id number]  PLAN OF CARE: Admit to inpatient   PATIENT DISPOSITION:  PACU - hemodynamically stable.   Delay start of Pharmacological VTE agent (>24hrs) due to surgical blood loss or risk of bleeding: no

## 2015-07-12 NOTE — Discharge Summary (Signed)
Physician Discharge Summary   Patient ID: ALDAN CAMEY MRN: [id number] DOB/AGE: 05-24-1940 75 y.o.  Admit date: 07/12/2015 Discharge date: anticipated 07/15/15  Primary Diagnosis: Right knee primary osteoarthritis  Admission Diagnoses:  Past Medical History  Diagnosis Date  . Dyslipidemia   . Mitral regurgitation     Mild  . Abnormal PFT 1. 05/18/08  2. 11/30/08    1. Showed mild airflow obstruction, mild restriction, mild diffusion defect; FEV1 2.22(64%), FVC 3.33(65%), FEVi% 67, TLC 5.19(69%), DLCO 77%, +BD  2. FEV1 2.38(73%), FVC 3.81(80%), FEV1% 63, TLC 5.61(80%), DLCO 79%, no BD  . Parkinsonism ([place]) 06/22/2012  . Paresthesias 06/22/2012  . Hyperlipidemia   . GERD (gastroesophageal reflux disease)   . Erectile dysfunction   . BPH (benign prostatic hyperplasia)   . Restrictive lung disease   . Cellulitis     right leg MRSA  . Osteoarthritis   . Allergic rhinitis   . Anxiety   . Hypertension   . Atrial fibrillation (HCC)     persistent afib  . Heart murmur     as a teenager   . Dysrhythmia     A FIB   Discharge Diagnoses:   Principal Problem:   Primary osteoarthritis of right knee  Estimated body mass index is 32.08 kg/(m^2) as calculated from the following:   Height as of this encounter: _0  (1.88 m).   Weight as of this encounter: 113.399 kg (250 lb).  Procedure:  Procedure(s) (LRB): RIGHT TOTAL KNEE ARTHROPLASTY (Right)   Consults: internal medicine for medical mgmt of multiple medical issues including Parkinson's Disease  HPI: see H&P Laboratory Data: Admission on 07/12/2015  Component Date Value Ref Range Status  . aPTT 07/12/2015 29  24 - 37 seconds Final  . Prothrombin Time 07/12/2015 16.6* 11.6 - 15.2 seconds Final  . INR 07/12/2015 1.38  0.00 - 1.49 Final  . Creatinine, Ser 07/12/2015 0.83  0.61 - 1.24 mg/dL Final  . GFR calc non Af Amer 07/12/2015 >60  >60 mL/min Final  . GFR calc Af Amer 07/12/2015 >60  >60 mL/min Final   Comment: (NOTE) The  eGFR has been calculated using the CKD EPI equation. This calculation has not been validated in all clinical situations. eGFR's persistently <60 mL/min signify possible Chronic Kidney Disease.   Hospital Outpatient Visit on 07/05/2015  Component Date Value Ref Range Status  . MRSA, PCR 07/05/2015 NEGATIVE  NEGATIVE Final  . Staphylococcus aureus 07/05/2015 NEGATIVE  NEGATIVE Final   Comment:        The Xpert SA Assay (FDA approved for NASAL specimens in patients over 35 years of age), is one component of a comprehensive surveillance program.  Test performance has been validated by [facility] for patients greater than or equal to 48 year old. It is not intended to diagnose infection nor to guide or monitor treatment.   Marland Kitchen aPTT 07/05/2015 36  24 - 37 seconds Final  . Sodium 07/05/2015 138  135 - 145 mmol/L Final  . Potassium 07/05/2015 4.5  3.5 - 5.1 mmol/L Final  . Chloride 07/05/2015 103  101 - 111 mmol/L Final  . CO2 07/05/2015 28  22 - 32 mmol/L Final  . Glucose, Bld 07/05/2015 94  65 - 99 mg/dL Final  . BUN 07/05/2015 21* 6 - 20 mg/dL Final  . Creatinine, Ser 07/05/2015 1.16  0.61 - 1.24 mg/dL Final  . Calcium 07/05/2015 9.8  8.9 - 10.3 mg/dL Final  . GFR calc non Af Amer 07/05/2015 >60  >  60 mL/min Final  . GFR calc Af Amer 07/05/2015 >60  >60 mL/min Final   Comment: (NOTE) The eGFR has been calculated using the CKD EPI equation. This calculation has not been validated in all clinical situations. eGFR's persistently <60 mL/min signify possible Chronic Kidney Disease.   . Anion gap 07/05/2015 7  5 - 15 Final  . WBC 07/05/2015 3.2* 4.0 - 10.5 K/uL Final  . RBC 07/05/2015 4.31  4.22 - 5.81 MIL/uL Final  . Hemoglobin 07/05/2015 13.1  13.0 - 17.0 g/dL Final  . HCT 07/05/2015 38.5* 39.0 - 52.0 % Final  . MCV 07/05/2015 89.3  78.0 - 100.0 fL Final  . MCH 07/05/2015 30.4  26.0 - 34.0 pg Final  . MCHC 07/05/2015 34.0  30.0 - 36.0 g/dL Final  . RDW 07/05/2015 14.3  11.5 - 15.5  % Final  . Platelets 07/05/2015 149* 150 - 400 K/uL Final  . Neutrophils Relative % 07/05/2015 49   Final  . Neutro Abs 07/05/2015 1.6* 1.7 - 7.7 K/uL Final  . Lymphocytes Relative 07/05/2015 31   Final  . Lymphs Abs 07/05/2015 1.0  0.7 - 4.0 K/uL Final  . Monocytes Relative 07/05/2015 14   Final  . Monocytes Absolute 07/05/2015 0.4  0.1 - 1.0 K/uL Final  . Eosinophils Relative 07/05/2015 6   Final  . Eosinophils Absolute 07/05/2015 0.2  0.0 - 0.7 K/uL Final  . Basophils Relative 07/05/2015 0   Final  . Basophils Absolute 07/05/2015 0.0  0.0 - 0.1 K/uL Final  . Prothrombin Time 07/05/2015 25.6* 11.6 - 15.2 seconds Final  . INR 07/05/2015 2.36* 0.00 - 1.49 Final  . ABO/RH(D) 07/05/2015 O POS   Final  . Antibody Screen 07/05/2015 NEG   Final  . Sample Expiration 07/05/2015 07/15/2015   Final  . Extend sample reason 07/05/2015 NO TRANSFUSIONS OR PREGNANCY IN THE PAST 3 MONTHS   Final  . Color, Urine 07/05/2015 YELLOW  YELLOW Final  . APPearance 07/05/2015 CLEAR  CLEAR Final  . Specific Gravity, Urine 07/05/2015 1.024  1.005 - 1.030 Final  . pH 07/05/2015 5.0  5.0 - 8.0 Final  . Glucose, UA 07/05/2015 NEGATIVE  NEGATIVE mg/dL Final  . Hgb urine dipstick 07/05/2015 NEGATIVE  NEGATIVE Final  . Bilirubin Urine 07/05/2015 NEGATIVE  NEGATIVE Final  . Ketones, ur 07/05/2015 NEGATIVE  NEGATIVE mg/dL Final  . Protein, ur 07/05/2015 NEGATIVE  NEGATIVE mg/dL Final  . Nitrite 07/05/2015 NEGATIVE  NEGATIVE Final  . Leukocytes, UA 07/05/2015 NEGATIVE  NEGATIVE Final   MICROSCOPIC NOT DONE ON URINES WITH NEGATIVE PROTEIN, BLOOD, LEUKOCYTES, NITRITE, OR GLUCOSE <1000 mg/dL.  Anti-coag visit on 06/01/2015  Component Date Value Ref Range Status  . INR 06/01/2015 2.9   Final     X-Rays:Dg Knee 1-2 Views Right  07/12/2015  CLINICAL DATA:  Status post right knee replacement. EXAM: RIGHT KNEE - 1-2 VIEW COMPARISON:  July 05, 2015. FINDINGS: The femoral and tibial components appear to be well situated.  Surgical drain is noted in the suprapatellar region. No fracture or dislocation is noted. IMPRESSION: Status post right total knee arthroplasty. These results will be called to the ordering clinician or representative by the Radiologist Assistant, and communication documented in the PACS or zVision Dashboard. Electronically Signed   By: Marijo Conception, M.D.   On: 07/12/2015 10:40   Dg Knee 1-2 Views Right  07/05/2015  CLINICAL DATA:  Primary osteoarthritis in knee.  Preop evaluation EXAM: RIGHT KNEE - 1-2 VIEW COMPARISON:  03/13/2011 FINDINGS: Mild spurring in the medial lateral joint margin. Advanced degenerative change in the patellofemoral joint. Negative for fracture or effusion. IMPRESSION: Degenerative change most severe in the patellofemoral joint. No acute abnormality. Electronically Signed   By: Franchot Gallo M.D.   On: 07/05/2015 14:11    EKG: Orders placed or performed during the hospital encounter of 12/16/14  . EKG 12-Lead  . EKG 12-Lead  . ED EKG  . ED EKG  . Repeat EKG  . Repeat EKG  . EKG 12-Lead  . EKG 12-Lead  . EKG     Hospital Course: Brian [name] is a 76 y.o. who was admitted to [facility]. They were brought to the operating room on 07/12/2015 and underwent Procedure(s): RIGHT TOTAL KNEE ARTHROPLASTY.  Patient tolerated the procedure well and was later transferred to the recovery room and then to the orthopaedic floor for postoperative care.  They were given PO and IV analgesics for pain control following their surgery.  They were given 24 hours of postoperative antibiotics of  Anti-infectives    Start     Dose/Rate Route Frequency Ordered Stop   07/12/15 1400  ceFAZolin (ANCEF) IVPB 2g/100 mL premix     2 g 200 mL/hr over 30 Minutes Intravenous Every 6 hours 07/12/15 1140 07/13/15 0159   07/12/15 0740  polymyxin B 500,000 Units, bacitracin 50,000 Units in sodium chloride irrigation 0.9 % 500 mL irrigation  Status:  Discontinued       As needed 07/12/15  0754 07/12/15 1021   07/12/15 0730  clindamycin (CLEOCIN) IVPB 900 mg     900 mg 100 mL/hr over 30 Minutes Intravenous  Once 07/12/15 0727 07/12/15 0747   07/12/15 0600  ceFAZolin (ANCEF) 2 g in dextrose 5 % 50 mL IVPB     2 g 140 mL/hr over 30 Minutes Intravenous On call to O.R. 07/12/15 [id number] 07/12/15 0726     and started on DVT prophylaxis in the form of Lovenox, Coumadin, TED hose and SCDs.   PT and OT were ordered for total joint protocol.  Discharge planning consulted to help with postop disposition and equipment needs.  Patient had a good night on the evening of surgery.  They started to get up OOB with therapy on day one. Hemovac drain was pulled without difficulty.  Continued to work with therapy into day two.  By day three, the patient had progressed with therapy and meeting their goals.  Incision was healing well.  Patient was seen in rounds and was ready to go to SNF.   Diet: Regular diet Activity:WBAT Follow-up:in 10-14 days Disposition - [facility] Discharged Condition: good   Discharge Instructions    Weight bearing as tolerated    Complete by:  As directed   Laterality:  right  Extremity:  Lower            Medication List    TAKE these medications        amLODipine 10 MG tablet  Commonly known as:  NORVASC  Take 10 mg by mouth every morning. @<HYWVPXTGGYIRSWNI>_6<\/EVOJJKKXFGHWEXHB>_7      BYSTOLIC 10 MG tablet  Generic drug:  nebivolol  Take 10 mg by mouth every morning. _1      carbidopa-levodopa 50-200 MG tablet  Commonly known as:  SINEMET CR  Take 1 tablet by mouth at bedtime.     carbidopa-levodopa 25-100 MG tablet  Commonly known as:  SINEMET IR  Take 2 tabs at 7am and 3pm. Take  1.5 tabs at 11am and 7pm.     clonazePAM 0.5 MG disintegrating tablet  Commonly known as:  KLONOPIN  Take 1 tablet (0.5 mg total) by mouth at bedtime.     docusate sodium 100 MG capsule  Commonly known as:  COLACE  Take 1 capsule (100 mg total) by mouth 2 (two) times daily.       doxazosin 2 MG tablet  Commonly known as:  CARDURA  TAKE THREE TABLETS BY MOUTH ONCE DAILY.     escitalopram 5 MG tablet  Commonly known as:  LEXAPRO  Take 1 tablet (5 mg total) by mouth daily.     famotidine 40 MG tablet  Commonly known as:  PEPCID  Take 40 mg by mouth daily. _0      furosemide 20 MG tablet  Commonly known as:  LASIX  Take 20 mg by mouth 2 (two) times daily. _1 , _2      gabapentin 100 MG capsule  Commonly known as:  NEURONTIN  Take 1 capsule (100 mg total) by mouth 3 (three) times daily.     HYDROcodone-acetaminophen 5-325 MG tablet  Commonly known as:  NORCO  Take 1-2 tablets by mouth every 6 (six) hours as needed for moderate pain or severe pain.     ketoconazole 2 % cream  Commonly known as:  NIZORAL  Apply 2 application topically 2 (two) times a week. Tuesday and Friday     KLOR-CON M10 10 MEQ tablet  Generic drug:  potassium chloride  Take 1 tablet by mouth 2 (two) times daily. _3 , _4      multivitamin with minerals Tabs tablet  Take 1 tablet by mouth every morning. @_5      olmesartan 40 MG tablet  Commonly known as:  BENICAR  Take 40 mg by mouth every morning. @_6      ondansetron 4 MG disintegrating tablet  Commonly known as:  ZOFRAN ODT  39m ODT q6 hours prn nausea/vomit     polyethylene glycol packet  Commonly known as:  MIRALAX / GLYCOLAX  Take 17 g by mouth daily.     pravastatin 40 MG tablet  Commonly known as:  PRAVACHOL  Take 40 mg by mouth every morning. _7      sodium chloride 0.65 % Soln nasal spray  Commonly known as:  OCEAN  Place 2 sprays into both nostrils 4 (four) times daily as needed for congestion.     traMADol 50 MG tablet  Commonly known as:  ULTRAM  Take 1 tablet (50 mg total) by mouth every 6 (six) hours as needed (mild to moderate pain).     Vitamin D3 1000 units Caps  Take 1,000 Units by mouth daily. _8      warfarin 5 MG tablet  Commonly known as:  COUMADIN  Take as directed by   Coumadin clinic           Follow-up Information    Follow up with BEANE,JEFFREY C, MD In 2 weeks.   Specialty:  Orthopedic Surgery   Why:  For suture removal   Contact information:   [phone]      Signed: JLacie Draft PA-C Orthopaedic Surgery 07/12/2015, 6:01 PM

## 2015-07-13 ENCOUNTER — Other Ambulatory Visit: Payer: Self-pay | Admitting: Neurology

## 2015-07-13 ENCOUNTER — Encounter (HOSPITAL_COMMUNITY): Payer: Self-pay | Admitting: Internal Medicine

## 2015-07-13 DIAGNOSIS — I482 Chronic atrial fibrillation: Secondary | ICD-10-CM

## 2015-07-13 DIAGNOSIS — G20 Parkinson's disease: Secondary | ICD-10-CM

## 2015-07-13 DIAGNOSIS — I10 Essential (primary) hypertension: Secondary | ICD-10-CM

## 2015-07-13 DIAGNOSIS — K219 Gastro-esophageal reflux disease without esophagitis: Secondary | ICD-10-CM

## 2015-07-13 LAB — CBC
HEMATOCRIT: 35.5 % — AB (ref 39.0–52.0)
Hemoglobin: 12.3 g/dL — ABNORMAL LOW (ref 13.0–17.0)
MCH: 31.5 pg (ref 26.0–34.0)
MCHC: 34.6 g/dL (ref 30.0–36.0)
MCV: 91 fL (ref 78.0–100.0)
Platelets: 133 10*3/uL — ABNORMAL LOW (ref 150–400)
RBC: 3.9 MIL/uL — ABNORMAL LOW (ref 4.22–5.81)
RDW: 14.3 % (ref 11.5–15.5)
WBC: 5.9 10*3/uL (ref 4.0–10.5)

## 2015-07-13 LAB — BASIC METABOLIC PANEL
ANION GAP: 8 (ref 5–15)
BUN: 12 mg/dL (ref 4–21)
BUN: 12 mg/dL (ref 6–20)
CALCIUM: 8.9 mg/dL (ref 8.9–10.3)
CHLORIDE: 103 mmol/L (ref 101–111)
CO2: 27 mmol/L (ref 22–32)
Creatinine, Ser: 1.01 mg/dL (ref 0.61–1.24)
Creatinine: 1 mg/dL (ref 0.6–1.3)
GFR calc Af Amer: >60 mL/min (ref >60)
GFR calc non Af Amer: >60 mL/min (ref >60)
GLUCOSE: 139 mg/dL
GLUCOSE: 139 mg/dL — AB (ref 65–99)
Potassium: 4 mmol/L (ref 3.5–5.1)
Sodium: 138 mmol/L (ref 135–145)
Sodium: 138 mmol/L (ref 137–147)

## 2015-07-13 LAB — PROTIME-INR
INR: 1.43 (ref 0.00–1.49)
Prothrombin Time: 17 seconds — ABNORMAL HIGH (ref 11.6–15.2)

## 2015-07-13 MED ORDER — WARFARIN SODIUM 7.5 MG PO TABS
7.5000 mg | ORAL_TABLET | Freq: Once | ORAL | Status: AC
  Administered 2015-07-13: 7.5 mg via ORAL
  Filled 2015-07-13: qty 1

## 2015-07-13 MED ORDER — ENOXAPARIN SODIUM 40 MG/0.4ML ~~LOC~~ SOLN
40.0000 mg | Freq: Once | SUBCUTANEOUS | Status: DC
  Filled 2015-07-13: qty 0.4

## 2015-07-13 MED ORDER — ENOXAPARIN SODIUM 40 MG/0.4ML ~~LOC~~ SOLN
40.0000 mg | SUBCUTANEOUS | Status: DC
  Administered 2015-07-13: 40 mg via SUBCUTANEOUS
  Filled 2015-07-13 (×3): qty 0.4

## 2015-07-13 NOTE — Clinical Social Work Placement (Signed)
   CLINICAL SOCIAL WORK PLACEMENT  NOTE  Date:  07/13/2015  Patient Details  Name: Brian [name] MRN: [id number] Date of Birth: 03/17/1941  Clinical Social Work is seeking post-discharge placement for this patient at the [place] level of care (*CSW will initial, date and re-position this form in  chart as items are completed):  No   Patient/family provided with [place] Work Department's list of facilities offering this level of care within the geographic area requested by the patient (or if unable, by the patient's family).  No   Patient/family informed of their freedom to choose among providers that offer the needed level of care, that participate in Medicare, Medicaid or managed care program needed by the patient, have an available bed and are willing to accept the patient.  No   Patient/family informed of [facility]'s ownership interest in [facility] and [facility], as well as of the fact that they are under no obligation to receive care at these facilities.  PASRR submitted to EDS on       PASRR number received on       Existing PASRR number confirmed on 07/13/15     FL2 transmitted to all facilities in geographic area requested by pt/family on       FL2 transmitted to all facilities within larger geographic area on       Patient informed that his/her managed care company has contracts with or will negotiate with certain facilities, including the following:  [employer] informed of bed offers received.  Patient chooses bed at       Physician recommends and patient chooses bed at [facility]    Patient to be transferred to   on  .  Patient to be transferred to facility by       Patient family notified on   of transfer.  Name of family member notified:        PHYSICIAN Please sign FL2     Additional Comment:    _______________________________________________ Drema Balzarine D,  LCSW 07/13/2015, 11:10 AM

## 2015-07-13 NOTE — Addendum Note (Signed)
Addendum  created 07/13/15 0710 by Lollie Sails, CRNA   Modules edited: Charges VN

## 2015-07-13 NOTE — Progress Notes (Signed)
Physical Therapy Treatment Patient Details Name: Brian [name] MRN: [id number] DOB: 1940-10-29 Today's Date: 07/13/2015    History of Present Illness R TKR; hx of L TKR 9/16, and Parkinsons    PT Comments    Increased time required for all tasks and very slow progress with mobility.    Follow Up Recommendations  SNF     Equipment Recommendations  None recommended by PT    Recommendations for Other Services OT consult     Precautions / Restrictions Precautions Precautions: Knee;Fall Required Braces or Orthoses: Knee Immobilizer - Right Knee Immobilizer - Right: Discontinue once straight leg raise with < 10 degree lag Restrictions Weight Bearing Restrictions: No Other Position/Activity Restrictions: WBAT    Mobility  Bed Mobility Overal bed mobility: Needs Assistance;+2 for physical assistance Bed Mobility: Supine to Sit     Supine to sit: Mod assist;+2 for physical assistance;+2 for safety/equipment     General bed mobility comments: cues for sequence and use of L LE to self assist.   Transfers Overall transfer level: Needs assistance Equipment used: Rolling walker (2 wheeled) Transfers: Sit to/from Stand Sit to Stand: Mod assist;+2 physical assistance;+2 safety/equipment;From elevated surface         General transfer comment: Cues for LE management and use of UEs to self assist.  Physical assist to bring wt up and fwd and to maintain balance on initial standing.    Ambulation/Gait Ambulation/Gait assistance: Mod assist;+2 physical assistance;+2 safety/equipment Ambulation Distance (Feet): 0 Feet Assistive device: Rolling walker (2 wheeled)   Gait velocity: decr   General Gait Details: Pt stood but unable to initiate step with either foot.  Bed moved from behind and chair pulled in behind pt   Stairs            Wheelchair Mobility    Modified Rankin (Stroke Patients Only)       Balance Overall balance assessment: Needs  assistance Sitting-balance support: Feet supported;No upper extremity supported Sitting balance-Leahy Scale: Fair     Standing balance support: Bilateral upper extremity supported Standing balance-Leahy Scale: Poor                      Cognition Arousal/Alertness: Awake/alert Behavior During Therapy: WFL for tasks assessed/performed Overall Cognitive Status: Within Functional Limits for tasks assessed                      Exercises      General Comments        Pertinent Vitals/Pain Pain Assessment: Faces Faces Pain Scale: Hurts little more Pain Location: R knee Pain Descriptors / Indicators: Aching;Sore Pain Intervention(s): Limited activity within patient's tolerance;Monitored during session;Premedicated before session;Ice applied    Home Living                      Prior Function            PT Goals (current goals can now be found in the care plan section) Acute Rehab PT Goals Patient Stated Goal: Regain IND PT Goal Formulation: With patient Time For Goal Achievement: 07/19/15 Potential to Achieve Goals: Fair Progress towards PT goals: Progressing toward goals    Frequency  7X/week    PT Plan Current plan remains appropriate    Co-evaluation             End of Session Equipment Utilized During Treatment: Gait belt;Right knee immobilizer Activity Tolerance: Patient limited by fatigue;Other (comment) Patient left: in chair;with  call bell/phone within reach;with nursing/sitter in room     Time: [id number] PT Time Calculation (min) (ACUTE ONLY): 27 min  Charges:  $Therapeutic Activity: 23-37 mins                    G Codes:      Evelynne Spiers 07/24/2015, 5:22 PM

## 2015-07-13 NOTE — Progress Notes (Signed)
Physical Therapy Treatment Patient Details Name: Brian [name] MRN: [id number] DOB: [birth year] Today's Date: 07/13/2015    History of Present Illness R TKR; hx of L TKR 9/16, and Parkinsons    PT Comments    Pt very cooperative with ltd 2* to difficulty initiating movement - This am, pt unable to initiate step with either foot.  Follow Up Recommendations  SNF     Equipment Recommendations  None recommended by PT    Recommendations for Other Services OT consult     Precautions / Restrictions Precautions Precautions: Knee;Fall Required Braces or Orthoses: Knee Immobilizer - Right Knee Immobilizer - Right: Discontinue once straight leg raise with < 10 degree lag Restrictions Weight Bearing Restrictions: No Other Position/Activity Restrictions: WBAT    Mobility  Bed Mobility Overal bed mobility: Needs Assistance;+2 for physical assistance Bed Mobility: Supine to Sit;Sit to Supine     Supine to sit: Min assist;Mod assist;+2 for physical assistance Sit to supine: Mod assist;+2 for physical assistance   General bed mobility comments: cues for sequence and use of L LE to self assist.  Delayed response  ? Parkinsons  Transfers Overall transfer level: Needs assistance Equipment used: Rolling walker (2 wheeled) Transfers: Sit to/from Stand Sit to Stand: Mod assist;+2 physical assistance;+2 safety/equipment;From elevated surface         General transfer comment: Cues for LE management and use of UEs to self assist.  Physical assist to bring wt up and fwd and to maintain balance on initial standing.  Pt stood x 2 with difficulty achieveing erect standing  Ambulation/Gait Ambulation/Gait assistance: +2 physical assistance;Mod assist Ambulation Distance (Feet): 0 Feet Assistive device: Rolling walker (2 wheeled)       General Gait Details: Pt stood x 2 but unable to initiate step with either foot   Stairs            Wheelchair Mobility    Modified Rankin  (Stroke Patients Only)       Balance Overall balance assessment: Needs assistance Sitting-balance support: Bilateral upper extremity supported;Feet supported Sitting balance-Leahy Scale: Poor   Postural control: Posterior lean Standing balance support: Bilateral upper extremity supported Standing balance-Leahy Scale: Poor                      Cognition Arousal/Alertness: Awake/alert Behavior During Therapy: WFL for tasks assessed/performed Overall Cognitive Status: Within Functional Limits for tasks assessed                      Exercises Total Joint Exercises Ankle Circles/Pumps: AAROM;Supine;Both Quad Sets: AAROM;Both;10 reps;Supine Heel Slides: AAROM;10 reps;Right;Supine Straight Leg Raises: AAROM;Right;10 reps;Supine Goniometric ROM: AAROM R knee -10 - 40    General Comments        Pertinent Vitals/Pain Pain Assessment: Faces Faces Pain Scale: Hurts little more Pain Location: R knee Pain Descriptors / Indicators: Aching;Sore Pain Intervention(s): Limited activity within patient's tolerance;Monitored during session;Premedicated before session;Ice applied    Home Living                      Prior Function            PT Goals (current goals can now be found in the care plan section) Acute Rehab PT Goals Patient Stated Goal: Regain IND PT Goal Formulation: With patient Time For Goal Achievement: 07/19/15 Potential to Achieve Goals: Fair Progress towards PT goals: Progressing toward goals    Frequency  7X/week    PT  Plan Current plan remains appropriate    Co-evaluation             End of Session Equipment Utilized During Treatment: Gait belt;Right knee immobilizer Activity Tolerance: Patient limited by fatigue;Other (comment) Patient left: in bed;with call bell/phone within reach     Time: 1125-1159 PT Time Calculation (min) (ACUTE ONLY): 34 min  Charges:  $Therapeutic Exercise: 8-22 mins $Therapeutic Activity: 8-22  mins                    G Codes:      Brian [name] Jul 24, 2015, 1:03 PM

## 2015-07-13 NOTE — Progress Notes (Signed)
OT Cancellation Note  Patient Details Name: Brian [name] MRN: [id number] DOB: 1940-07-26   Cancelled Treatment:    Reason Eval/Treat Not Completed: Other (comment) -- Patient's d/c plan is SNF. Will defer all OT needs to SNF.   Alaycia Eardley A 07/13/2015, 7:20 AM

## 2015-07-13 NOTE — Clinical Social Work Note (Signed)
Clinical Social Work Assessment  Patient Details  Name: Brian [name] MRN: [id number] Date of Birth: 1941-03-21  Date of referral:  07/12/15               Reason for consult:  Facility Placement                Permission sought to share information with:  Family Supports Permission granted to share information::  Yes, Verbal Permission Granted  Name::     Geovanie Umbaugh  Agency::     Relationship::  wife  Contact Information:     Housing/Transportation Living arrangements for the past 2 months:  Single Family Home Source of Information:  Patient Patient Interpreter Needed:  None Criminal Activity/Legal Involvement Pertinent to Current Situation/Hospitalization:  No - Comment as needed Significant Relationships:  Adult Children, Spouse, Dependent Children Lives with:  Self, Adult Children, Spouse, Minor Children Do you feel safe going back to the place where you live?  Yes Need for family participation in patient care:  Yes (Comment)  Care giving concerns:  Pt will need STR before returning home.   Social Worker assessment / plan:  CSW introduced self and explained the role of CSW.  Pt is married. His wife, adult child and grandchildren live in the home.  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  Pt stated he has been to STR previously due to knee surgery.  Pt attended STR at [facility].  Pt is aware that his doctor is recommending that he return to [facility] to receive STR again.   CSW will complete FL2 and submit information to [facility].  Employment status:  Retired Forensic scientist:  Medicare PT Recommendations:  [place] / Referral to community resources:  [place]  Patient/Family's Response to care:  Pt was pleasant and appreciative of CSW assistance.  Patient/Family's Understanding of and Emotional Response to Diagnosis, Current Treatment, and Prognosis:  Pt recognizes that  he needs STR.  He stated his wife is supportive and helps him make decisions.  Pt appeared content with discharge plans.  Emotional Assessment Appearance:  Appears stated age Attitude/Demeanor/Rapport:  Lethargic Affect (typically observed):  Calm, Pleasant, Accepting, Appropriate Orientation:  Oriented to Self, Oriented to Place, Oriented to  Time, Oriented to Situation Alcohol / Substance use:  Not Applicable Psych involvement (Current and /or in the community):  No (Comment)  Discharge Needs  Concerns to be addressed:  Discharge Planning Concerns Readmission within the last 30 days:  No Current discharge risk:  None Barriers to Discharge:  Continued Medical Work up   [place], [place], LCSW 07/13/2015, 10:46 AM

## 2015-07-13 NOTE — Progress Notes (Signed)
ANTICOAGULATION CONSULT NOTE - Follow Up Consult  Pharmacy Consult for Warfarin Indication: atrial fibrillation and VTE prophylaxis  Allergies  Allergen Reactions  . Lisinopril Other (See Comments)    coughing    Patient Measurements: Height: 6\' 2"  (188 cm) Weight: 250 lb (113.399 kg) IBW/kg (Calculated) : 82.2  Vital Signs: Temp: 98.9 F (37.2 C) (04/21 0538) Temp Source: Oral (04/21 0538) BP: 136/59 mmHg (04/21 0538) Pulse Rate: 71 (04/21 0538)  Labs:  Recent Labs  07/12/15 0610 07/12/15 1229 07/13/15 0407  HGB  --   --  12.3*  HCT  --   --  35.5*  PLT  --   --  133*  APTT 29  --   --   LABPROT 16.6*  --  17.0*  INR 1.38  --  1.43  CREATININE  --  0.83 1.01    Estimated Creatinine Clearance: 87.3 mL/min (by C-G formula based on Cr of 1.01).  Assessment: 75yo M s/p right TKA 4/20. PTA warfarin, for atrial fibrillation, held prior to procedure (last dose 4/14). Pharmacy consulted to continue warfarin and manage post-op, starting POD0 at 1500 to align with patient's home dosing schedule.  PTA warfarin dose is 5mg  daily except 7.5mg  on Wednesday per 06/01/15 anticoag visit (INR 2.9).  Today, 07/13/2015:  - INR 1.43 - Low CBC values: RBC 3.9, Hgb 12.3, Hct 35.5, plt 133.  - Lovenox 40mg  given at 0943 - CrCl ~87 mL/min (105 mL/min per C-G eqn) Goal of Therapy:  INR 2-3 Monitor platelets by anticoagulation protocol: Yes   Plan:  Warfarin 7.5mg  today at 1500 Continue Lovenox 40mg  q24h (next dose 0900 07/14/2015) CBC daily INR daily   Joyclyn Plazola 07/13/2015,9:26 AM

## 2015-07-13 NOTE — Progress Notes (Signed)
Subjective: 1 Day Post-Op Procedure(s) (LRB): RIGHT TOTAL KNEE ARTHROPLASTY (Right) Patient reports pain as 4 on 0-10 scale.    Objective: Vital signs in last 24 hours: Temp:  [97.9 F (36.6 C)-99.2 F (37.3 C)] 98.9 F (37.2 C) (04/21 0538) Pulse Rate:  [57-111] 71 (04/21 0538) Resp:  [10-16] 12 (04/21 0538) BP: (122-169)/(59-95) 136/59 mmHg (04/21 0538) SpO2:  [95 %-100 %] 96 % (04/21 0538) Weight:  [111.131 kg (245 lb)-113.399 kg (250 lb)] 113.399 kg (250 lb) (04/20 1121)  Intake/Output from previous day: 04/20 0701 - 04/21 0700 In: 3735.8 [P.O.:840; I.V.:2515.8; IV Piggyback:380] Out: [id number]; Drains:495; Blood:100] Intake/Output this shift: Total I/O In: 975.8 [P.O.:360; I.V.:415.8; IV Piggyback:200] Out: 1075 [Urine:850; Drains:225]   Recent Labs  07/13/15 0407  HGB 12.3*    Recent Labs  07/13/15 0407  WBC 5.9  RBC 3.90*  HCT 35.5*  PLT 133*    Recent Labs  07/12/15 1229 07/13/15 0407  NA  --  138  K  --  4.0  CL  --  103  CO2  --  27  BUN  --  12  CREATININE 0.83 1.01  GLUCOSE  --  139*  CALCIUM  --  8.9    Recent Labs  07/12/15 0610 07/13/15 0407  INR 1.38 1.43    Neurologically intact Sensation intact distally Intact pulses distally Incision: dressing C/D/I Compartment soft  Assessment/Plan: 1 Day Post-Op Procedure(s) (LRB): RIGHT TOTAL KNEE ARTHROPLASTY (Right) Advance diet Up with therapy  Needs ISB. Discussed with NSG. CPM lovenox Doing well  [place] C 07/13/2015, 6:42 AM

## 2015-07-13 NOTE — Progress Notes (Signed)
PROGRESS NOTE  Brian [name]  [id number] DOB: 05/30/1940  DOA: 07/12/2015 PCP: Wenda Low, MD  Outpatient Specialists:  Cardiology: Dr. Fransico Him.  Brief Narrative:  75 year old male patient, lives alone, PMH of HLD, parkinsonism, GERD, BPH, HTN, chronic A. fib on chronic anticoagulation with warfarin, chronic lower extremity edema on furosemide, OA s/p L TKA, underwent right TKA on 07/12/15 and hospitalists were consulted for evaluation and management of multiple chronic medical problems.   Assessment & Plan:   Principal Problem:   Primary osteoarthritis of right knee Active Problems:   Hyperlipidemia   Essential hypertension   Atrial fibrillation (HCC)   Parkinsonism (HCC)   GERD (gastroesophageal reflux disease)   Essential hypertension - Reasonably controlled on current regimen (amlodipine, doxazosin, irbesartan, nebivolol)-continue.  Permanent atrial fibrillation - Controlled ventricular rate. Continue nebivolol. - Continue warfarin per pharmacy. INR: 1.43.   Chronic lower extremity edema - Seems euvolemic. Continue furosemide and potassium supplements.  Parkinsonism - Continue Sinemet. - Has outpatient follow-up with neurology next month.   GERD - Continue famotidine.  Anxiety and depression - Continue clonazepam and Lexapro.  Mild thrombocytopenia - Seems chronic and intermittent. Follow CBCs periodically.  Primary osteoarthritis of right knee, s/p TKA - Management per primary service.   DVT prophylaxis: Lovenox. On Coumadin per pharmacy-INR subtherapeutic.  Code Status: Full  Family Communication: Discussed with patient. None at bedside.  Disposition Plan: DC to SNF per primary service.   Consultants:   TRH   Procedures:   S/P R total knee arthroplasty on 07/12/15   Antimicrobials:   None    Subjective: Appropriate postop right knee pain. Denies any other complaints. No chest pain or dyspnea.   Objective:  Filed Vitals:   07/13/15 0254 07/13/15 0538 07/13/15 0949 07/13/15 1354  BP: 147/63 136/59 157/67 153/58  Pulse: 67 71 79 74  Temp: 99.2 F (37.3 C) 98.9 F (37.2 C) 99.7 F (37.6 C) 100.2 F (37.9 C)  TempSrc: Oral Oral Oral Oral  Resp: 12 12 14 14   Height:      Weight:      SpO2: 96% 96% 97% 97%    Intake/Output Summary (Last 24 hours) at 07/13/15 1609 Last data filed at 07/13/15 1421  Gross per 24 hour  Intake 2015.83 ml  Output   1725 ml  Net 290.83 ml   Filed Weights   07/12/15 0645 07/12/15 1121  Weight: 111.131 kg (245 lb) 113.399 kg (250 lb)    Examination:  General exam: Pleasant elderly male lying comfortably supine in bed. Appears calm and comfortable  Respiratory system: Clear to auscultation. Respiratory effort normal. Cardiovascular system: S1 & S2 heard, RRR.Marland Kitchen No JVD, murmurs, rubs, gallops or clicks. No pedal edema. Gastrointestinal system: Abdomen is nondistended, soft and nontender. No organomegaly or masses felt. Normal bowel sounds heard. Central nervous system: Alert and oriented. No focal neurological deficits. Extremities: Symmetric 5 x 5 power except right lower extremity, where movements are limited secondary to postop pain. Left knee TKA scar.  Skin: No rashes, lesions or ulcers Psychiatry: Judgement and insight appear normal. Mood & affect appropriate.     Data Reviewed: I have personally reviewed following labs and imaging studies  CBC:  Recent Labs Lab 07/13/15 0407  WBC 5.9  HGB 12.3*  HCT 35.5*  MCV 91.0  PLT [id number]*   Basic Metabolic Panel:  Recent Labs Lab 07/12/15 1229 07/13/15 0407  NA  --  138  K  --  4.0  CL  --  103  CO2  --  27  GLUCOSE  --  139*  BUN  --  12  CREATININE 0.83 1.01  CALCIUM  --  8.9   GFR: Estimated Creatinine Clearance: 87.3 mL/min (by C-G formula based on Cr of 1.01). Liver Function Tests: No results for input(s): AST, ALT, ALKPHOS, BILITOT, PROT, ALBUMIN in the last 168 hours. No results for input(s):  LIPASE, AMYLASE in the last 168 hours. No results for input(s): AMMONIA in the last 168 hours. Coagulation Profile:  Recent Labs Lab 07/12/15 0610 07/13/15 0407  INR 1.38 1.43   Cardiac Enzymes: No results for input(s): CKTOTAL, CKMB, CKMBINDEX, TROPONINI in the last 168 hours. BNP (last 3 results) No results for input(s): PROBNP in the last 8760 hours. HbA1C: No results for input(s): HGBA1C in the last 72 hours. CBG: No results for input(s): GLUCAP in the last 168 hours. Lipid Profile: No results for input(s): CHOL, HDL, LDLCALC, TRIG, CHOLHDL, LDLDIRECT in the last 72 hours. Thyroid Function Tests: No results for input(s): TSH, T4TOTAL, FREET4, T3FREE, THYROIDAB in the last 72 hours. Anemia Panel: No results for input(s): VITAMINB12, FOLATE, FERRITIN, TIBC, IRON, RETICCTPCT in the last 72 hours. Urine analysis:    Component Value Date/Time   COLORURINE YELLOW 07/05/2015 1205   APPEARANCEUR CLEAR 07/05/2015 1205   LABSPEC 1.024 07/05/2015 1205   PHURINE 5.0 07/05/2015 1205   GLUCOSEU NEGATIVE 07/05/2015 1205   HGBUR NEGATIVE 07/05/2015 1205   [place] 07/05/2015 1205   [place] 07/05/2015 1205   PROTEINUR NEGATIVE 07/05/2015 1205   UROBILINOGEN 1.0 11/23/2014 1455   NITRITE NEGATIVE 07/05/2015 1205   LEUKOCYTESUR NEGATIVE 07/05/2015 1205   Sepsis Labs: @LABRCNTIP (procalcitonin:4,lacticidven:4)  ) Recent Results (from the past 240 hour(s))  Surgical pcr screen     Status: None   Collection Time: 07/05/15 12:05 PM  Result Value Ref Range Status   MRSA, PCR NEGATIVE NEGATIVE Final   Staphylococcus aureus NEGATIVE NEGATIVE Final    Comment:        The Xpert SA Assay (FDA approved for NASAL specimens in patients over 35 years of age), is one component of a comprehensive surveillance program.  Test performance has been validated by [facility] for patients greater than or equal to 64 year old. It is not intended to diagnose infection nor  to guide or monitor treatment.          Radiology Studies: Dg Knee 1-2 Views Right  07/12/2015  CLINICAL DATA:  Status post right knee replacement. EXAM: RIGHT KNEE - 1-2 VIEW COMPARISON:  July 05, 2015. FINDINGS: The femoral and tibial components appear to be well situated. Surgical drain is noted in the suprapatellar region. No fracture or dislocation is noted. IMPRESSION: Status post right total knee arthroplasty. These results will be called to the ordering clinician or representative by the Radiologist Assistant, and communication documented in the PACS or zVision Dashboard. Electronically Signed   By: Marijo Conception, M.D.   On: 07/12/2015 10:40        Scheduled Meds: . acidophilus  1 capsule Oral Daily  . amLODipine  10 mg Oral q morning - 10a  . carbidopa-levodopa  1 tablet Oral QHS  . carbidopa-levodopa  2 tablet Oral BID   And  . carbidopa-levodopa  1.5 tablet Oral BID  . clonazePAM  0.5 mg Oral QHS  . docusate sodium  100 mg Oral BID  . doxazosin  2 mg Oral TID  . enoxaparin (LOVENOX) injection  40 mg Subcutaneous Q24H  .  escitalopram  5 mg Oral QHS  . famotidine  40 mg Oral Daily  . furosemide  20 mg Oral BID  . gabapentin  100 mg Oral TID  . irbesartan  75 mg Oral Daily  . naloxegol oxalate  12.5 mg Oral Daily  . nebivolol  10 mg Oral q morning - 10a  . polyethylene glycol  17 g Oral Daily  . potassium chloride  10 mEq Oral BID  . Warfarin - Pharmacist Dosing Inpatient   Does not apply Q1500   Continuous Infusions:     LOS: 1 day    Time spent: 20 minutes.    [facility], MD [facility] Pager [phone]  If 7PM-7AM, please contact night-coverage [email or web address] Password [facility] 07/13/2015, 4:09 PM

## 2015-07-13 NOTE — [place] FL2 (Signed)
MEDICAID FL2 LEVEL OF CARE SCREENING TOOL     IDENTIFICATION  Patient Name: Brian [name] Birthdate: May 25, 1940 Sex: male Admission Date (Current Location): 07/12/2015  [facility] and [place] Number:  Herbalist and Address:  [facility],  [place] [place], [place]      Provider Number: [id number]  Attending Physician Name and Address:  Susa Day, MD  Relative Name and Phone Number:       Current Level of Care: Hospital Recommended Level of Care: [place] Prior Approval Number:    Date Approved/Denied:   PASRR Number: [id number]  Discharge Plan: SNF    Current Diagnoses: Patient Active Problem List   Diagnosis Date Noted  . Primary osteoarthritis of right knee 07/12/2015  . Primary osteoarthritis of left knee 11/30/2014  . Encounter for therapeutic drug monitoring 06/21/2013  . Edema extremities 02/04/2013  . Chronic anticoagulation 02/04/2013  . GERD (gastroesophageal reflux disease)   . Restrictive lung disease   . CKD (chronic kidney disease) stage 2, GFR 60-89 ml/min   . Cellulitis   . Osteoarthritis   . Allergic rhinitis   . Anxiety   . Long term (current) use of anticoagulants 01/25/2013  . Parkinsonism ([place]) 06/22/2012  . Paresthesias 06/22/2012  . Atrial fibrillation ([place]) 10/14/2010  . PULMONARY NODULE 06/02/2008  . SNORING 06/02/2008  . Hyperlipidemia 05/15/2008  . Essential hypertension 05/15/2008  . PULMONARY FUNCTION TESTS, ABNORMAL 05/15/2008    Orientation RESPIRATION BLADDER Height & Weight     Self, Time, Situation, Place  Normal Continent Weight: 250 lb (113.399 kg) Height:  6\' 2"  (188 cm)  BEHAVIORAL SYMPTOMS/MOOD NEUROLOGICAL BOWEL NUTRITION STATUS      Continent    AMBULATORY STATUS COMMUNICATION OF NEEDS Skin   Limited Assist   Surgical wounds                       Personal Care Assistance Level of Assistance  Bathing, Dressing, Feeding Bathing Assistance:  Limited assistance Feeding assistance: Independent Dressing Assistance: Limited assistance     Functional Limitations Info             SPECIAL CARE FACTORS FREQUENCY  PT (By licensed PT), OT (By licensed OT)     PT Frequency: 7x/week              Contractures      Additional Factors Info  Allergies   Allergies Info: Lisinopril           Current Medications (07/13/2015):  This is the current hospital active medication list Current Facility-Administered Medications  Medication Dose Route Frequency Provider Last Rate Last Dose  . acetaminophen (TYLENOL) tablet 650 mg  650 mg Oral Q6H PRN Susa Day, MD       Or  . acetaminophen (TYLENOL) suppository 650 mg  650 mg Rectal Q6H PRN Susa Day, MD      . acidophilus (RISAQUAD) capsule 1 capsule  1 capsule Oral Daily Susa Day, MD   1 capsule at 07/13/15 0944  . alum & mag hydroxide-simeth (MAALOX/MYLANTA) 200-200-20 MG/5ML suspension 30 mL  30 mL Oral Q4H PRN Susa Day, MD      . amLODipine (NORVASC) tablet 10 mg  10 mg Oral q morning - 10a Susa Day, MD   10 mg at 07/13/15 0944  . bisacodyl (DULCOLAX) EC tablet 5 mg  5 mg Oral Daily PRN Susa Day, MD      . carbidopa-levodopa (SINEMET CR) 50-200  MG per tablet controlled release 1 tablet  1 tablet Oral QHS Susa Day, MD   1 tablet at 07/12/15 2214  . carbidopa-levodopa (SINEMET IR) 25-100 MG per tablet immediate release 2 tablet  2 tablet Oral BID Susa Day, MD   2 tablet at 07/13/15 0749   And  . carbidopa-levodopa (SINEMET IR) 25-100 MG per tablet immediate release 1.5 tablet  1.5 tablet Oral BID Susa Day, MD   1.5 tablet at 07/12/15 1904  . clonazePAM (KLONOPIN) tablet 0.5 mg  0.5 mg Oral QHS Susa Day, MD   0.5 mg at 07/12/15 2215  . dextrose 5 % and 0.45 % NaCl with KCl 20 mEq/L infusion   Intravenous Continuous Susa Day, MD 50 mL/hr at 07/12/15 1741    . diphenhydrAMINE (BENADRYL) 12.5 MG/5ML elixir 12.5-25 mg  12.5-25 mg Oral  Q4H PRN Susa Day, MD      . docusate sodium (COLACE) capsule 100 mg  100 mg Oral BID Susa Day, MD   100 mg at 07/13/15 0944  . doxazosin (CARDURA) tablet 2 mg  2 mg Oral TID Susa Day, MD   2 mg at 07/13/15 0749  . enoxaparin (LOVENOX) injection 40 mg  40 mg Subcutaneous Q24H Susa Day, MD   40 mg at 07/13/15 0943  . escitalopram (LEXAPRO) tablet 5 mg  5 mg Oral QHS Susa Day, MD   5 mg at 07/12/15 2214  . famotidine (PEPCID) tablet 40 mg  40 mg Oral Daily Susa Day, MD   40 mg at 07/13/15 0944  . furosemide (LASIX) tablet 20 mg  20 mg Oral BID Susa Day, MD   20 mg at 07/13/15 0950  . gabapentin (NEURONTIN) capsule 100 mg  100 mg Oral TID Susa Day, MD   100 mg at 07/13/15 0944  . HYDROcodone-acetaminophen (NORCO/VICODIN) 5-325 MG per tablet 1-2 tablet  1-2 tablet Oral Q4H PRN Susa Day, MD   2 tablet at 07/13/15 0944  . HYDROmorphone (DILAUDID) injection 0.5 mg  0.5 mg Intravenous Q2H PRN Susa Day, MD   0.5 mg at 07/12/15 1741  . irbesartan (AVAPRO) tablet 75 mg  75 mg Oral Daily Susa Day, MD   75 mg at 07/13/15 0944  . ketoconazole (NIZORAL) 2 % cream 2 application  2 application Topical Once per day on Mon Thu Susa Day, MD      . magnesium citrate solution 1 Bottle  1 Bottle Oral Once PRN Susa Day, MD      . menthol-cetylpyridinium (CEPACOL) lozenge 3 mg  1 lozenge Oral PRN Susa Day, MD       Or  . phenol (CHLORASEPTIC) mouth spray 1 spray  1 spray Mouth/Throat PRN Susa Day, MD      . methocarbamol (ROBAXIN) tablet 500 mg  500 mg Oral Q6H PRN Susa Day, MD   500 mg at 07/12/15 2004   Or  . methocarbamol (ROBAXIN) 500 mg in dextrose 5 % 50 mL IVPB  500 mg Intravenous Q6H PRN Susa Day, MD      . metoCLOPramide (REGLAN) tablet 5-10 mg  5-10 mg Oral Q8H PRN Susa Day, MD       Or  . metoCLOPramide (REGLAN) injection 5-10 mg  5-10 mg Intravenous Q8H PRN Susa Day, MD      . naloxegol oxalate (MOVANTIK) tablet  12.5 mg  12.5 mg Oral Daily Cecilie Kicks, PA-C   12.5 mg at 07/13/15 0944  . nebivolol (BYSTOLIC) tablet 10 mg  10 mg Oral q  morning - 10a Susa Day, MD   10 mg at 07/13/15 0944  . ondansetron (ZOFRAN) tablet 4 mg  4 mg Oral Q6H PRN Susa Day, MD       Or  . ondansetron [facility]) injection 4 mg  4 mg Intravenous Q6H PRN Susa Day, MD      . ondansetron (ZOFRAN-ODT) disintegrating tablet 8 mg  8 mg Oral Q8H PRN Susa Day, MD      . polyethylene glycol (MIRALAX / GLYCOLAX) packet 17 g  17 g Oral Daily Susa Day, MD   17 g at 07/13/15 0945  . polyethylene glycol (MIRALAX / GLYCOLAX) packet 17 g  17 g Oral Daily PRN Susa Day, MD      . potassium chloride (K-DUR,KLOR-CON) CR tablet 10 mEq  10 mEq Oral BID Susa Day, MD   10 mEq at 07/13/15 0944  . sodium chloride (OCEAN) 0.65 % nasal spray 2 spray  2 spray Each Nare QID PRN Susa Day, MD      . traMADol Veatrice Bourbon) tablet 50 mg  50 mg Oral Q6H PRN Susa Day, MD      . warfarin (COUMADIN) tablet 7.5 mg  7.5 mg Oral Once Dara Hoyer, RPH      . Warfarin - Pharmacist Dosing Inpatient   Does not apply [place], RPH   0  at 07/12/15 1500     Discharge Medications: Please see discharge summary for a list of discharge medications.  Relevant Imaging Results:  Relevant Lab Results:   Additional Information [id number]  Drema Balzarine D, [place]

## 2015-07-14 LAB — CBC
HCT: 34.8 % — ABNORMAL LOW (ref 39.0–52.0)
Hemoglobin: 12.1 g/dL — ABNORMAL LOW (ref 13.0–17.0)
MCH: 31.5 pg (ref 26.0–34.0)
MCHC: 34.8 g/dL (ref 30.0–36.0)
MCV: 90.6 fL (ref 78.0–100.0)
PLATELETS: 132 10*3/uL — AB (ref 150–400)
RBC: 3.84 MIL/uL — ABNORMAL LOW (ref 4.22–5.81)
RDW: 14.3 % (ref 11.5–15.5)
WBC: 8.5 10*3/uL (ref 4.0–10.5)

## 2015-07-14 LAB — PROTIME-INR
INR: 1.92 — ABNORMAL HIGH (ref 0.00–1.49)
Prothrombin Time: 21.3 seconds — ABNORMAL HIGH (ref 11.6–15.2)

## 2015-07-14 MED ORDER — WARFARIN SODIUM 5 MG PO TABS
5.0000 mg | ORAL_TABLET | Freq: Once | ORAL | Status: AC
  Administered 2015-07-14: 5 mg via ORAL
  Filled 2015-07-14: qty 1

## 2015-07-14 NOTE — Progress Notes (Signed)
PROGRESS NOTE  Brian [name]  [id number] DOB: 23-Dec-1940  DOA: 07/12/2015 PCP: Wenda Low, MD  Outpatient Specialists:  Cardiology: Dr. Fransico Him.  Brief Narrative:  75 year old male patient, lives alone, PMH of HLD, parkinsonism, GERD, BPH, HTN, chronic A. fib on chronic anticoagulation with warfarin, chronic lower extremity edema on furosemide, OA s/p L TKA, underwent right TKA on 07/12/15 and hospitalists were consulted for evaluation and management of multiple chronic medical problems.   Assessment & Plan:   Principal Problem:   Primary osteoarthritis of right knee Active Problems:   Hyperlipidemia   Essential hypertension   Atrial fibrillation (HCC)   Parkinsonism (HCC)   GERD (gastroesophageal reflux disease)   Essential hypertension - Reasonably controlled on current regimen (amlodipine, doxazosin, irbesartan, nebivolol)-continue.  Permanent atrial fibrillation - Controlled ventricular rate. Continue nebivolol. - Continue warfarin per pharmacy. INR: 1.9.   Chronic lower extremity edema - Seems euvolemic. Continue furosemide and potassium supplements.  Parkinsonism - Continue Sinemet. - Has outpatient follow-up with neurology next month.   GERD - Continue famotidine.  Anxiety and depression - Continue clonazepam and Lexapro.  Mild thrombocytopenia - Seems chronic and intermittent. Stable.  Primary osteoarthritis of right knee, s/p TKA - Management per primary service. DC to SNF in 1-2 days.   DVT prophylaxis: Lovenox bridge. On Coumadin per pharmacy-INR subtherapeutic but improving.  Code Status: Full  Family Communication: Discussed with patient. None at bedside.  Disposition Plan: DC to SNF per primary service.   Consultants:   TRH   Procedures:   S/P R total knee arthroplasty on 07/12/15   Antimicrobials:   None    Subjective: Feels tired after working with PT this morning. Right knee pain better. Denies any other  complaints.  Objective:  Filed Vitals:   07/13/15 1354 07/13/15 2050 07/14/15 1006 07/14/15 1314  BP: 153/58 142/76 112/66 130/79  Pulse: 74 75 94 97  Temp: 100.2 F (37.9 C) 98.8 F (37.1 C) 98.4 F (36.9 C) 98.7 F (37.1 C)  TempSrc: Oral Oral Axillary Oral  Resp: 14 14 14 16   Height:      Weight:      SpO2: 97% 97% 99% 98%    Intake/Output Summary (Last 24 hours) at 07/14/15 1333 Last data filed at 07/14/15 0845  Gross per 24 hour  Intake    715 ml  Output    400 ml  Net    315 ml   Filed Weights   07/12/15 0645 07/12/15 1121  Weight: 111.131 kg (245 lb) 113.399 kg (250 lb)    Examination:  General exam: Pleasant elderly male lying assisted up to chair by PT this morning.  Respiratory system: Clear to auscultation. Respiratory effort normal. Cardiovascular system: S1 & S2 heard, RRR.Marland Kitchen No JVD, murmurs, rubs, gallops or clicks. No pedal edema. Gastrointestinal system: Abdomen is nondistended, soft and nontender. No organomegaly or masses felt. Normal bowel sounds heard. Central nervous system: Alert and oriented. No focal neurological deficits. Extremities: Symmetric 5 x 5 power except right lower extremity, where movements are limited secondary to postop pain. OpSite dressing clean and dry. Left knee TKA scar.  Skin: No rashes, lesions or ulcers Psychiatry: Judgement and insight appear normal. Mood & affect appropriate.     Data Reviewed: I have personally reviewed following labs and imaging studies  CBC:  Recent Labs Lab 07/13/15 0407 07/14/15 0421  WBC 5.9 8.5  HGB 12.3* 12.1*  HCT 35.5* 34.8*  MCV 91.0 90.6  PLT 133* [id number]*   Basic Metabolic  Panel:  Recent Labs Lab 07/12/15 1229 07/13/15 0407  NA  --  138  K  --  4.0  CL  --  103  CO2  --  27  GLUCOSE  --  139*  BUN  --  12  CREATININE 0.83 1.01  CALCIUM  --  8.9   GFR: Estimated Creatinine Clearance: 87.3 mL/min (by C-G formula based on Cr of 1.01). Liver Function Tests: No results for  input(s): AST, ALT, ALKPHOS, BILITOT, PROT, ALBUMIN in the last 168 hours. No results for input(s): LIPASE, AMYLASE in the last 168 hours. No results for input(s): AMMONIA in the last 168 hours. Coagulation Profile:  Recent Labs Lab 07/12/15 0610 07/13/15 0407 07/14/15 0421  INR 1.38 1.43 1.92*   Cardiac Enzymes: No results for input(s): CKTOTAL, CKMB, CKMBINDEX, TROPONINI in the last 168 hours. BNP (last 3 results) No results for input(s): PROBNP in the last 8760 hours. HbA1C: No results for input(s): HGBA1C in the last 72 hours. CBG: No results for input(s): GLUCAP in the last 168 hours. Lipid Profile: No results for input(s): CHOL, HDL, LDLCALC, TRIG, CHOLHDL, LDLDIRECT in the last 72 hours. Thyroid Function Tests: No results for input(s): TSH, T4TOTAL, FREET4, T3FREE, THYROIDAB in the last 72 hours. Anemia Panel: No results for input(s): VITAMINB12, FOLATE, FERRITIN, TIBC, IRON, RETICCTPCT in the last 72 hours. Urine analysis:    Component Value Date/Time   COLORURINE YELLOW 07/05/2015 1205   APPEARANCEUR CLEAR 07/05/2015 1205   LABSPEC 1.024 07/05/2015 1205   PHURINE 5.0 07/05/2015 1205   GLUCOSEU NEGATIVE 07/05/2015 1205   HGBUR NEGATIVE 07/05/2015 1205   [place] 07/05/2015 1205   [place] 07/05/2015 1205   PROTEINUR NEGATIVE 07/05/2015 1205   UROBILINOGEN 1.0 11/23/2014 1455   NITRITE NEGATIVE 07/05/2015 1205   LEUKOCYTESUR NEGATIVE 07/05/2015 1205   Sepsis Labs: @LABRCNTIP (procalcitonin:4,lacticidven:4)  ) Recent Results (from the past 240 hour(s))  Surgical pcr screen     Status: None   Collection Time: 07/05/15 12:05 PM  Result Value Ref Range Status   MRSA, PCR NEGATIVE NEGATIVE Final   Staphylococcus aureus NEGATIVE NEGATIVE Final    Comment:        The Xpert SA Assay (FDA approved for NASAL specimens in patients over 5 years of age), is one component of a comprehensive surveillance program.  Test performance has been  validated by [facility] for patients greater than or equal to 108 year old. It is not intended to diagnose infection nor to guide or monitor treatment.          Radiology Studies: No results found.      Scheduled Meds: . acidophilus  1 capsule Oral Daily  . amLODipine  10 mg Oral q morning - 10a  . carbidopa-levodopa  1 tablet Oral QHS  . carbidopa-levodopa  2 tablet Oral BID   And  . carbidopa-levodopa  1.5 tablet Oral BID  . clonazePAM  0.5 mg Oral QHS  . docusate sodium  100 mg Oral BID  . doxazosin  2 mg Oral TID  . escitalopram  5 mg Oral QHS  . famotidine  40 mg Oral Daily  . furosemide  20 mg Oral BID  . gabapentin  100 mg Oral TID  . irbesartan  75 mg Oral Daily  . naloxegol oxalate  12.5 mg Oral Daily  . nebivolol  10 mg Oral q morning - 10a  . polyethylene glycol  17 g Oral Daily  . potassium chloride  10 mEq Oral BID  .  warfarin  5 mg Oral ONCE-1800  . Warfarin - Pharmacist Dosing Inpatient   Does not apply Q1500   Continuous Infusions:     LOS: 2 days    Time spent: 20 minutes.    [facility], MD [facility] Pager [phone]  If 7PM-7AM, please contact night-coverage [email or web address] Password [email or web address] 07/14/2015, 1:33 PM

## 2015-07-14 NOTE — Care Management Important Message (Signed)
Important Message  Patient Details  Name: Brian [name] MRN: [id number] Date of Birth: 11-07-1940   Medicare Important Message Given:  Yes    Erenest Rasher, RN 07/14/2015, 4:36 PM

## 2015-07-14 NOTE — Progress Notes (Signed)
Physical Therapy Treatment Patient Details Name: Brian [name] MRN: [id number] DOB: [birth year] Today's Date: 07/14/2015    History of Present Illness R TKR; hx of L TKR 9/16, and Parkinsons    PT Comments    Pt cooperative and progressing slowly this date with mobility - able to initiate several steps.  Follow Up Recommendations  SNF     Equipment Recommendations  None recommended by PT    Recommendations for Other Services OT consult     Precautions / Restrictions Precautions Precautions: Knee;Fall Required Braces or Orthoses: Knee Immobilizer - Right Knee Immobilizer - Right: Discontinue once straight leg raise with < 10 degree lag Restrictions Weight Bearing Restrictions: No Other Position/Activity Restrictions: WBAT    Mobility  Bed Mobility Overal bed mobility: Needs Assistance;+2 for physical assistance Bed Mobility: Supine to Sit     Supine to sit: Min assist;Mod assist;+2 for physical assistance;+2 for safety/equipment     General bed mobility comments: cues for sequence and use of L LE to self assist.   Transfers Overall transfer level: Needs assistance Equipment used: Rolling walker (2 wheeled) Transfers: Sit to/from Stand Sit to Stand: Mod assist;+2 physical assistance;+2 safety/equipment;From elevated surface         General transfer comment: Cues for LE management and use of UEs to self assist.  Physical assist to bring wt up and fwd and to maintain balance on initial standing.    Ambulation/Gait Ambulation/Gait assistance: Mod assist;+2 physical assistance;+2 safety/equipment Ambulation Distance (Feet): 1 Feet Assistive device: Rolling walker (2 wheeled) Gait Pattern/deviations: Step-to pattern;Decreased step length - right;Decreased step length - left;Shuffle;Trunk flexed Gait velocity: decr   General Gait Details: Pt able to initiate several small steps with each foot.  INcreased time and cues for posture, UE WB, and sequence.  Physical  assist for balance, support and R management.  Pt ltd by fatigue.   Stairs            Wheelchair Mobility    Modified Rankin (Stroke Patients Only)       Balance     Sitting balance-Leahy Scale: Fair       Standing balance-Leahy Scale: Poor                      Cognition Arousal/Alertness: Awake/alert Behavior During Therapy: WFL for tasks assessed/performed Overall Cognitive Status: Within Functional Limits for tasks assessed                      Exercises Total Joint Exercises Ankle Circles/Pumps: AAROM;Supine;Both;15 reps Quad Sets: AAROM;Both;10 reps;Supine Heel Slides: AAROM;Right;Supine;15 reps Straight Leg Raises: AAROM;Right;Supine;15 reps Goniometric ROM: AAROM at R knee -10 - 40    General Comments        Pertinent Vitals/Pain Pain Assessment: Faces Faces Pain Scale: Hurts even more Pain Location: R knee with flexion Pain Descriptors / Indicators: Sore Pain Intervention(s): Limited activity within patient's tolerance;Monitored during session;Premedicated before session;Ice applied (Pt taking Tyelenol only )    Home Living                      Prior Function            PT Goals (current goals can now be found in the care plan section) Acute Rehab PT Goals Patient Stated Goal: Regain IND PT Goal Formulation: With patient Time For Goal Achievement: 07/19/15 Potential to Achieve Goals: Fair Progress towards PT goals: Progressing toward goals    Frequency  7X/week    PT Plan Current plan remains appropriate    Co-evaluation             End of Session Equipment Utilized During Treatment: Gait belt;Right knee immobilizer Activity Tolerance: Patient limited by fatigue;Other (comment) Patient left: in chair;with call bell/phone within reach;with nursing/sitter in room     Time: 0913-0948 PT Time Calculation (min) (ACUTE ONLY): 35 min  Charges:  $Therapeutic Exercise: 8-22 mins $Therapeutic Activity:  8-22 mins                    G Codes:      Katrina Brosh Aug 07, 2015, 12:49 PM

## 2015-07-14 NOTE — Progress Notes (Signed)
    Subjective: 2 Days Post-Op Procedure(s) (LRB): RIGHT TOTAL KNEE ARTHROPLASTY (Right) Patient reports pain as 7 on 0-10 scale.   Denies CP or SOB.  Voiding without difficulty. Positive flatus. Objective: Vital signs in last 24 hours: Temp:  [98.8 F (37.1 C)-100.2 F (37.9 C)] 98.8 F (37.1 C) (04/21 2050) Pulse Rate:  [74-79] 75 (04/21 2050) Resp:  [14] 14 (04/21 2050) BP: (142-157)/(58-76) 142/76 mmHg (04/21 2050) SpO2:  [97 %] 97 % (04/21 2050)  Intake/Output from previous day: 04/21 0701 - 04/22 0700 In: 715 [P.O.:715] Out: 275 [Urine:275] Intake/Output this shift:    Labs:  Recent Labs  07/13/15 0407 07/14/15 0421  HGB 12.3* 12.1*    Recent Labs  07/13/15 0407 07/14/15 0421  WBC 5.9 8.5  RBC 3.90* 3.84*  HCT 35.5* 34.8*  PLT 133* 132*    Recent Labs  07/12/15 1229 07/13/15 0407  NA  --  138  K  --  4.0  CL  --  103  CO2  --  27  BUN  --  12  CREATININE 0.83 1.01  GLUCOSE  --  139*  CALCIUM  --  8.9    Recent Labs  07/13/15 0407 07/14/15 0421  INR 1.43 1.92*    Physical Exam: Neurologically intact ABD soft Sensation intact distally Incision: dressing C/D/I Compartment soft  Assessment/Plan: 2 Days Post-Op Procedure(s) (LRB): RIGHT TOTAL KNEE ARTHROPLASTY (Right) Up with therapy  Plan to DC to SNF Sunday or Monday Lovenox DC this am - bridging to Coumadin  [place], Darla Lesches for Dr. Melina Schools [facility] [phone] 07/14/2015, 7:37 AM

## 2015-07-14 NOTE — Care Management Note (Addendum)
Case Management Note  Patient Details  Name: Brian [name] MRN: [id number] Date of Birth: 10/01/1940  Subjective/Objective:        RIGHT TOTAL KNEE ARTHROPLASTY             Action/Plan: Discharge Planning: NCM spoke to pt and states plan is dc to SNF for rehab. Scheduled to dc to [facility] on Monday. CSW following referral for SNF.  At home with wife, [name] # [phone] and she will assist once he dc from SNF.   PCP- Wenda Low Md  Expected Discharge Date:  07/16/2015             Expected Discharge Plan:  Skilled Nursing Facility  In-House Referral:  Clinical Social Work  Discharge planning Services  CM Consult  Post Acute Care Choice:  NA Choice offered to:  NA  DME Arranged:  N/A DME Agency:  NA  HH Arranged:  NA HH Agency:     Status of Service:  Completed, signed off  Medicare Important Message Given:    Date Medicare IM Given:    Medicare IM give by:    Date Additional Medicare IM Given:    Additional Medicare Important Message give by:     If discussed at [place] of Stay Meetings, dates discussed:    Additional Comments:  Erenest Rasher, RN 07/14/2015, 4:34 PM

## 2015-07-14 NOTE — Progress Notes (Signed)
Physical Therapy Treatment Patient Details Name: Brian [name] MRN: [id number] DOB: 1940-07-21 Today's Date: 07-19-15    History of Present Illness R TKR; hx of L TKR 9/16, and Parkinsons    PT Comments    Pt progressing slowly.  Agreeable to therex only this pm 2* fatigue.  Follow Up Recommendations  SNF     Equipment Recommendations  None recommended by PT    Recommendations for Other Services OT consult     Precautions / Restrictions Precautions Precautions: Knee;Fall Required Braces or Orthoses: Knee Immobilizer - Right Knee Immobilizer - Right: Discontinue once straight leg raise with < 10 degree lag Restrictions Weight Bearing Restrictions: No Other Position/Activity Restrictions: WBAT    Mobility  Bed Mobility                  Transfers                    Ambulation/Gait                 Stairs            Wheelchair Mobility    Modified Rankin (Stroke Patients Only)       Balance                                    Cognition Arousal/Alertness: Awake/alert Behavior During Therapy: WFL for tasks assessed/performed Overall Cognitive Status: Within Functional Limits for tasks assessed                      Exercises Total Joint Exercises Ankle Circles/Pumps: AAROM;Supine;Both;15 reps Quad Sets: AAROM;Both;Supine;15 reps Heel Slides: AAROM;Right;Supine;20 reps Straight Leg Raises: AAROM;Right;Supine;20 reps    General Comments        Pertinent Vitals/Pain Pain Assessment: Faces Faces Pain Scale: Hurts even more Pain Location: R knee with flexion Pain Descriptors / Indicators: Sore Pain Intervention(s): Limited activity within patient's tolerance;Monitored during session;Premedicated before session;Ice applied (Pt taking Tyelenol only)    Home Living                      Prior Function            PT Goals (current goals can now be found in the care plan section) Acute Rehab  PT Goals Patient Stated Goal: Regain IND PT Goal Formulation: With patient Time For Goal Achievement: 07/19/15 Potential to Achieve Goals: Fair Progress towards PT goals: Progressing toward goals    Frequency  7X/week    PT Plan Current plan remains appropriate    Co-evaluation             End of Session   Activity Tolerance: Patient limited by fatigue Patient left: in bed;with call bell/phone within reach;with bed alarm set     Time: 1645-1700 PT Time Calculation (min) (ACUTE ONLY): 15 min  Charges:  $Therapeutic Exercise: 23-37 mins                    G Codes:      Brian [name] 2015/07/19, 5:04 PM

## 2015-07-14 NOTE — Progress Notes (Signed)
   Patient ID: Brian [name], male   DOB: Sep 28, 1940, 75 y.o.   MRN: [id number]   INR 1.92 Will D/C lovenox.

## 2015-07-14 NOTE — Progress Notes (Signed)
ANTICOAGULATION CONSULT NOTE - Follow Up Consult  Pharmacy Consult for Warfarin Indication: atrial fibrillation and VTE prophylaxis  Allergies  Allergen Reactions  . Lisinopril Other (See Comments)    coughing    Patient Measurements: Height: 6\' 2"  (188 cm) Weight: 250 lb (113.399 kg) IBW/kg (Calculated) : 82.2  Vital Signs: Temp: 98.4 F (36.9 C) (04/22 1006) Temp Source: Axillary (04/22 1006) BP: 112/66 mmHg (04/22 1006) Pulse Rate: 94 (04/22 1006)  Labs:  Recent Labs  07/12/15 0610 07/12/15 1229 07/13/15 0407 07/14/15 0421  HGB  --   --  12.3* 12.1*  HCT  --   --  35.5* 34.8*  PLT  --   --  133* 132*  APTT 29  --   --   --   LABPROT 16.6*  --  17.0* 21.3*  INR 1.38  --  1.43 1.92*  CREATININE  --  0.83 1.01  --     Estimated Creatinine Clearance: 87.3 mL/min (by C-G formula based on Cr of 1.01).  Assessment: 75yo M s/p right TKA 4/20. PTA warfarin, for atrial fibrillation, held prior to procedure (last dose 4/14). Pharmacy consulted to continue warfarin and manage post-op, starting POD0 at 1500 to align with patient's home dosing schedule.  PTA warfarin dose is 5mg  daily except 7.5mg  on Wednesday per 06/01/15 anticoag visit (INR 2.9).  Today, 07/14/2015:  - INR subtherapeutic but trended up overnight, now approaching therapeutic level - CBC: Hgb low/stable, pltc low/stable  - Lovenox 40mg  - d/c'd by ortho 4/22 - No major drug-drug interactions  Goal of Therapy:  INR 2-3 Monitor platelets by anticoagulation protocol: Yes   Plan:   Warfarin 5mg  today at 1500  INR daily  Doreene Eland, PharmD, BCPS.   Pager: [phone] 07/14/2015 11:25 AM

## 2015-07-15 DIAGNOSIS — D62 Acute posthemorrhagic anemia: Secondary | ICD-10-CM | POA: Diagnosis not present

## 2015-07-15 DIAGNOSIS — Z96651 Presence of right artificial knee joint: Secondary | ICD-10-CM | POA: Diagnosis not present

## 2015-07-15 DIAGNOSIS — E46 Unspecified protein-calorie malnutrition: Secondary | ICD-10-CM | POA: Diagnosis not present

## 2015-07-15 DIAGNOSIS — M6281 Muscle weakness (generalized): Secondary | ICD-10-CM | POA: Diagnosis not present

## 2015-07-15 DIAGNOSIS — E785 Hyperlipidemia, unspecified: Secondary | ICD-10-CM | POA: Diagnosis not present

## 2015-07-15 DIAGNOSIS — D696 Thrombocytopenia, unspecified: Secondary | ICD-10-CM | POA: Diagnosis not present

## 2015-07-15 DIAGNOSIS — D649 Anemia, unspecified: Secondary | ICD-10-CM | POA: Diagnosis not present

## 2015-07-15 DIAGNOSIS — N400 Enlarged prostate without lower urinary tract symptoms: Secondary | ICD-10-CM | POA: Diagnosis not present

## 2015-07-15 DIAGNOSIS — Z471 Aftercare following joint replacement surgery: Secondary | ICD-10-CM | POA: Diagnosis not present

## 2015-07-15 DIAGNOSIS — M13161 Monoarthritis, not elsewhere classified, right knee: Secondary | ICD-10-CM | POA: Diagnosis not present

## 2015-07-15 DIAGNOSIS — M25569 Pain in unspecified knee: Secondary | ICD-10-CM | POA: Diagnosis not present

## 2015-07-15 DIAGNOSIS — K5903 Drug induced constipation: Secondary | ICD-10-CM | POA: Diagnosis not present

## 2015-07-15 DIAGNOSIS — G20 Parkinson's disease: Secondary | ICD-10-CM | POA: Diagnosis not present

## 2015-07-15 DIAGNOSIS — R278 Other lack of coordination: Secondary | ICD-10-CM | POA: Diagnosis not present

## 2015-07-15 DIAGNOSIS — R609 Edema, unspecified: Secondary | ICD-10-CM | POA: Diagnosis not present

## 2015-07-15 DIAGNOSIS — K219 Gastro-esophageal reflux disease without esophagitis: Secondary | ICD-10-CM | POA: Diagnosis not present

## 2015-07-15 DIAGNOSIS — R600 Localized edema: Secondary | ICD-10-CM | POA: Diagnosis not present

## 2015-07-15 DIAGNOSIS — R2681 Unsteadiness on feet: Secondary | ICD-10-CM | POA: Diagnosis not present

## 2015-07-15 DIAGNOSIS — I10 Essential (primary) hypertension: Secondary | ICD-10-CM | POA: Diagnosis not present

## 2015-07-15 DIAGNOSIS — F418 Other specified anxiety disorders: Secondary | ICD-10-CM | POA: Diagnosis not present

## 2015-07-15 DIAGNOSIS — I480 Paroxysmal atrial fibrillation: Secondary | ICD-10-CM | POA: Diagnosis not present

## 2015-07-15 DIAGNOSIS — K5901 Slow transit constipation: Secondary | ICD-10-CM | POA: Diagnosis not present

## 2015-07-15 DIAGNOSIS — G4752 REM sleep behavior disorder: Secondary | ICD-10-CM | POA: Diagnosis not present

## 2015-07-15 DIAGNOSIS — M1711 Unilateral primary osteoarthritis, right knee: Secondary | ICD-10-CM | POA: Diagnosis not present

## 2015-07-15 DIAGNOSIS — Z79899 Other long term (current) drug therapy: Secondary | ICD-10-CM | POA: Diagnosis not present

## 2015-07-15 DIAGNOSIS — F419 Anxiety disorder, unspecified: Secondary | ICD-10-CM | POA: Diagnosis not present

## 2015-07-15 LAB — CBC
HEMATOCRIT: 34.5 % — AB (ref 39.0–52.0)
Hemoglobin: 12 g/dL — ABNORMAL LOW (ref 13.0–17.0)
MCH: 31.6 pg (ref 26.0–34.0)
MCHC: 34.8 g/dL (ref 30.0–36.0)
MCV: 90.8 fL (ref 78.0–100.0)
PLATELETS: 128 10*3/uL — AB (ref 150–400)
RBC: 3.8 MIL/uL — ABNORMAL LOW (ref 4.22–5.81)
RDW: 14.4 % (ref 11.5–15.5)
WBC: 8.5 10*3/uL (ref 4.0–10.5)

## 2015-07-15 LAB — CBC AND DIFFERENTIAL: WBC: 8.5 10*3/mL

## 2015-07-15 LAB — PROTIME-INR
INR: 2.3 — ABNORMAL HIGH (ref 0.00–1.49)
Prothrombin Time: 24.3 seconds — ABNORMAL HIGH (ref 11.6–15.2)

## 2015-07-15 MED ORDER — WARFARIN SODIUM 5 MG PO TABS
5.0000 mg | ORAL_TABLET | Freq: Once | ORAL | Status: DC
  Filled 2015-07-15: qty 1

## 2015-07-15 NOTE — Progress Notes (Signed)
Pt transferred of unit via [facility] for transport to [facility]. Accompanied by spouse

## 2015-07-15 NOTE — Progress Notes (Signed)
PROGRESS NOTE  Brian [name]  [id number] DOB: [birth year]  DOA: 07/12/2015 PCP: Wenda Low, MD  Outpatient Specialists:  Cardiology: Dr. Fransico Him.  Brief Narrative:  75 year old male patient, lives alone, PMH of HLD, parkinsonism, GERD, BPH, HTN, chronic A. fib on chronic anticoagulation with warfarin, chronic lower extremity edema on furosemide, OA s/p L TKA, underwent right TKA on 07/12/15 and hospitalists were consulted for evaluation and management of multiple chronic medical problems.   Assessment & Plan:   Principal Problem:   Primary osteoarthritis of right knee Active Problems:   Hyperlipidemia   Essential hypertension   Atrial fibrillation (HCC)   Parkinsonism (HCC)   GERD (gastroesophageal reflux disease)   Essential hypertension - Reasonably controlled on current regimen (amlodipine, doxazosin, ARB, nebivolol)-continue.  Permanent atrial fibrillation - Controlled ventricular rate. Continue nebivolol. - Continue warfarin per pharmacy. INR: 2.3.   Chronic lower extremity edema - Seems euvolemic. Continue furosemide and potassium supplements.  Parkinsonism - Continue Sinemet at prior home dose. - Has outpatient follow-up with neurology/[facility] next month.   GERD - Continue famotidine.  Anxiety and depression - Continue clonazepam and Lexapro.  Mild thrombocytopenia - Seems chronic and intermittent. Stable. Periodically follow CBC at SNF.  Primary osteoarthritis of right knee, s/p TKA - Management per primary service. Minimal patchy redness seen on medial aspect of right knee without increase in warmth. Surgical site dressing clean and dry. Low index of suspicion for infection but asked RN to inform orthopedic service regarding care. As per clinical social worker, orthopedics plans discharge to SNF today.   DVT prophylaxis: Anticoagulated on Coumadin.  Code Status: Full  Family Communication: Discussed with patient  and spouse at bedside.   Disposition Plan: DC to SNF per primary service. TRH will sign off on 4/23. Please contact us for any further assistance.   Consultants:   TR[facility]   Procedures:   S/P R total knee arthroplasty on 07/12/15   Antimicrobials:   None    Subjective: Had a good BM today. Decreased pain in right knee and able to move it better. No other complaints reported.  Objective:  Filed Vitals:   07/14/15 1006 07/14/15 1314 07/14/15 2028 07/15/15 0642  BP: 112/66 130/79 123/64 112/78  Pulse: 94 97 83 82  Temp: 98.4 F (36.9 C) 98.7 F (37.1 C) 99.5 F (37.5 C) 98.9 F (37.2 C)  TempSrc: Axillary Oral Oral Oral  Resp: 14 16 16 16   Height:      Weight:      SpO2: 99% 98% 96% 97%    Intake/Output Summary (Last 24 hours) at 07/15/15 1251 Last data filed at 07/15/15 0959  Gross per 24 hour  Intake    480 ml  Output    850 ml  Net   -370 ml   Filed Weights   07/12/15 0645 07/12/15 1121  Weight: 111.131 kg (245 lb) 113.399 kg (250 lb)    Examination:  General exam: Pleasant elderly male lying comfortably supine in bed Respiratory system: Clear to auscultation. Respiratory effort normal. Cardiovascular system: S1 & S2 heard, RRR.Marland Kitchen No JVD, murmurs, rubs, gallops or clicks. No pedal edema. Gastrointestinal system: Abdomen is nondistended, soft and nontender. No organomegaly or masses felt. Normal bowel sounds heard. Central nervous system: Alert and oriented. No focal neurological deficits. Extremities: Symmetric 5 x 5 power except right lower extremity, where movements are limited secondary to postop pain. OpSite dressing clean and dry. Minimal patchy erythema medial aspect of right knee without increased  warmth or tenderness. Left knee TKA scar.  Skin: No rashes, lesions or ulcers Psychiatry: Judgement and insight appear normal. Mood & affect appropriate.     Data Reviewed: I have personally reviewed following labs and imaging studies  CBC:  Recent  Labs Lab 07/13/15 0407 07/14/15 0421 07/15/15 0425  WBC 5.9 8.5 8.5  HGB 12.3* 12.1* 12.0*  HCT 35.5* 34.8* 34.5*  MCV 91.0 90.6 90.8  PLT 133* 132* 0000000*   Basic Metabolic Panel:  Recent Labs Lab 07/12/15 1229 07/13/15 0407  NA  --  138  K  --  4.0  CL  --  103  CO2  --  27  GLUCOSE  --  139*  BUN  --  12  CREATININE 0.83 1.01  CALCIUM  --  8.9   GFR: Estimated Creatinine Clearance: 87.3 mL/min (by C-G formula based on Cr of 1.01). Liver Function Tests: No results for input(s): AST, ALT, ALKPHOS, BILITOT, PROT, ALBUMIN in the last 168 hours. No results for input(s): LIPASE, AMYLASE in the last 168 hours. No results for input(s): AMMONIA in the last 168 hours. Coagulation Profile:  Recent Labs Lab 07/12/15 0610 07/13/15 0407 07/14/15 0421 07/15/15 0425  INR 1.38 1.43 1.92* 2.30*   Cardiac Enzymes: No results for input(s): CKTOTAL, CKMB, CKMBINDEX, TROPONINI in the last 168 hours. BNP (last 3 results) No results for input(s): PROBNP in the last 8760 hours. HbA1C: No results for input(s): HGBA1C in the last 72 hours. CBG: No results for input(s): GLUCAP in the last 168 hours. Lipid Profile: No results for input(s): CHOL, HDL, LDLCALC, TRIG, CHOLHDL, LDLDIRECT in the last 72 hours. Thyroid Function Tests: No results for input(s): TSH, T4TOTAL, FREET4, T3FREE, THYROIDAB in the last 72 hours. Anemia Panel: No results for input(s): VITAMINB12, FOLATE, FERRITIN, TIBC, IRON, RETICCTPCT in the last 72 hours. Urine analysis:    Component Value Date/Time   COLORURINE YELLOW 07/05/2015 1205   APPEARANCEUR CLEAR 07/05/2015 1205   LABSPEC 1.024 07/05/2015 1205   PHURINE 5.0 07/05/2015 1205   GLUCOSEU NEGATIVE 07/05/2015 1205   HGBUR NEGATIVE 07/05/2015 1205   BILIRUBINUR NEGATIVE 07/05/2015 [place] 07/05/2015 1205   PROTEINUR NEGATIVE 07/05/2015 1205   UROBILINOGEN 1.0 11/23/2014 1455   NITRITE NEGATIVE 07/05/2015 [place]  07/05/2015 1205       Radiology Studies: No results found.      Scheduled Meds: . acidophilus  1 capsule Oral Daily  . amLODipine  10 mg Oral q morning - 10a  . carbidopa-levodopa  1 tablet Oral QHS  . carbidopa-levodopa  2 tablet Oral BID   And  . carbidopa-levodopa  1.5 tablet Oral BID  . clonazePAM  0.5 mg Oral QHS  . docusate sodium  100 mg Oral BID  . doxazosin  2 mg Oral TID  . escitalopram  5 mg Oral QHS  . famotidine  40 mg Oral Daily  . furosemide  20 mg Oral BID  . gabapentin  100 mg Oral TID  . irbesartan  75 mg Oral Daily  . naloxegol oxalate  12.5 mg Oral Daily  . nebivolol  10 mg Oral q morning - 10a  . polyethylene glycol  17 g Oral Daily  . potassium chloride  10 mEq Oral BID  . warfarin  5 mg Oral Once  . Warfarin - Pharmacist Dosing Inpatient   Does not apply Q1500   Continuous Infusions:     LOS: 3 days    Time spent: 20  minutes.    [facility], MD [facility] Pager [phone]  If 7PM-7AM, please contact night-coverage [email or web address] Password [employer] 07/15/2015, 12:51 PM

## 2015-07-15 NOTE — Progress Notes (Signed)
   07/15/15 1348  PT Visit Information  Last PT Received On 07/15/15  Assistance Needed +2  History of Present Illness R TKR; hx of L TKR 9/16, and Parkinsons  Subjective Data  Patient Stated Goal Regain IND  Precautions  Precautions Knee;Fall  Required Braces or Orthoses Knee Immobilizer - Right  Knee Immobilizer - Right Discontinue once straight leg raise with < 10 degree lag  Restrictions  Weight Bearing Restrictions No  Other Position/Activity Restrictions WBAT  Pain Assessment  Pain Assessment Faces  Faces Pain Scale 2  Pain Location R knee  Pain Descriptors / Indicators Sore  Pain Intervention(s) Limited activity within patient's tolerance;Premedicated before session  Cognition  Arousal/Alertness Awake/alert  Behavior During Therapy [facility] for tasks assessed/performed  Overall Cognitive Status Within Functional Limits for tasks assessed  Bed Mobility  Overal bed mobility Needs Assistance;+2 for physical assistance  Bed Mobility Rolling  Rolling +2 for physical assistance;Mod assist  General bed mobility comments cues for sequence and use of L LE to self assist.    Total Joint Exercises  Ankle Circles/Pumps AAROM;Supine;Both;15 reps  Quad Sets AROM;Strengthening;Both;10 reps  PT - End of Session  Equipment Utilized During Treatment Right knee immobilizer  Activity Tolerance Other (comment) (pt requested bed pan after PT initiated)  Patient left in bed;with call bell/phone within reach;with family/visitor present  PT - Assessment/Plan  PT Plan Current plan remains appropriate  PT Frequency (ACUTE ONLY) 7X/week  Follow Up Recommendations SNF  PT equipment None recommended by PT  PT Goal Progression  Progress towards PT goals Progressing toward goals  Acute Rehab PT Goals  PT Goal Formulation With patient  Time For Goal Achievement 07/19/15  Potential to Achieve Goals Fair  PT Time Calculation  PT Start Time (ACUTE ONLY) 0939  PT Stop Time (ACUTE ONLY) 0949  PT Time  Calculation (min) (ACUTE ONLY) 10 min  PT General Charges  $$ ACUTE PT VISIT 1 Procedure  PT Treatments  $Therapeutic Activity 8-22 mins

## 2015-07-15 NOTE — Progress Notes (Signed)
   07/15/15 1400  PT Visit Information  Last PT Received On 07/15/15  Assistance Needed +2  History of Present Illness R TKR; hx of L TKR 9/16, and Parkinsons  Subjective Data  Patient Stated Goal Regain IND  Precautions  Precautions Knee;Fall  Required Braces or Orthoses Knee Immobilizer - Right  Knee Immobilizer - Right Discontinue once straight leg raise with < 10 degree lag  Restrictions  Weight Bearing Restrictions No  Other Position/Activity Restrictions WBAT  Pain Assessment  Pain Assessment Faces  Faces Pain Scale 2  Pain Location R knee  Pain Descriptors / Indicators Aching  Pain Intervention(s) Limited activity within patient's tolerance;Monitored during session  Cognition  Arousal/Alertness Awake/alert  Behavior During Therapy Flat affect  Overall Cognitive Status Impaired/Different from baseline  Area of Impairment Following commands;Problem solving  Following Commands Follows one step commands with increased time  Problem Solving Slow processing;Decreased initiation;Difficulty sequencing;Requires verbal cues;Requires tactile cues  Bed Mobility  Overal bed mobility Needs Assistance;+2 for physical assistance  Bed Mobility Rolling;Sidelying to Sit  Rolling Max assist  Sidelying to sit Max assist  General bed mobility comments multi-modal cues for sequence, facilitation  Transfers  Overall transfer level Needs assistance  Equipment used Rolling walker (2 wheeled)  Transfers Sit to/from Stand;Stand Pivot Transfers  Sit to Stand Mod assist;+2 physical assistance;+2 safety/equipment;From elevated surface  Stand pivot transfers +2 physical assistance;Mod assist;+2 safety/equipment;From elevated surface  General transfer comment Cues for LE management and use of UEs to self assist.  Physical assist, rhythmic movements and counting  to bring wt up and fwd and to maintain balance on initial standing.    Ambulation/Gait  General Gait Details NT per nsg request/pt to D/c  today to  [facility] only  Balance  Sitting balance-Leahy Scale Fair  Postural control Posterior lean  Standing balance-Leahy Scale Poor  Total Joint Exercises  Heel Slides Left;10 reps;AROM  PT - End of Session  Equipment Utilized During Treatment Right knee immobilizer;Gait belt  Activity Tolerance Patient tolerated treatment well  Patient left Other (comment);with family/visitor present (BSC, NT present)  PT - Assessment/Plan  PT Plan Current plan remains appropriate  PT Frequency (ACUTE ONLY) 7X/week  Recommendations for Other Services OT consult  Follow Up Recommendations SNF  PT equipment None recommended by PT  PT Goal Progression  Progress towards PT goals Progressing toward goals  Acute Rehab PT Goals  PT Goal Formulation With patient  Time For Goal Achievement 07/19/15  PT Time Calculation  PT Start Time (ACUTE ONLY) 1147  PT Stop Time (ACUTE ONLY) 1200  PT Time Calculation (min) (ACUTE ONLY) 13 min  PT General Charges  $$ ACUTE PT VISIT 1 Procedure  PT Treatments  $Therapeutic Activity 8-22 mins

## 2015-07-15 NOTE — Progress Notes (Signed)
   07/15/15 1417  PT Visit Information  Last PT Received On 07/15/15  Assistance Needed +2  History of Present Illness R TKR; hx of L TKR 9/16, and Parkinsons  Subjective Data  Patient Stated Goal Regain IND  Precautions  Precautions Knee;Fall  Required Braces or Orthoses Knee Immobilizer - Right  Knee Immobilizer - Right Discontinue once straight leg raise with < 10 degree lag  Restrictions  Other Position/Activity Restrictions WBAT  Pain Assessment  Pain Assessment Faces  Faces Pain Scale 2  Pain Location R knee with extension stretch  Pain Descriptors / Indicators Grimacing;Sore  Pain Intervention(s) Limited activity within patient's tolerance;Monitored during session  Cognition  Arousal/Alertness Awake/alert  Behavior During Therapy Flat affect  Overall Cognitive Status Impaired/Different from baseline  Area of Impairment Following commands;Problem solving  Following Commands Follows one step commands with increased time  Problem Solving Slow processing;Decreased initiation;Difficulty sequencing;Requires verbal cues;Requires tactile cues  Bed Mobility  Overal bed mobility Needs Assistance;+2 for physical assistance  Bed Mobility Sit to Supine  Sit to supine Min assist;+2 for physical assistance  General bed mobility comments multi-modal cues for sequence, facilitation scooting, positioning etc  Transfers  Overall transfer level Needs assistance  Equipment used Rolling walker (2 wheeled)  Transfers Sit to/from Stand;Stand Pivot Transfers  Sit to Stand Mod assist;+2 physical assistance;+2 safety/equipment;From elevated surface  Stand pivot transfers +2 physical assistance;Mod assist;+2 safety/equipment;From elevated surface  General transfer comment Cues for LE management and use of UEs to self assist.  Physical assist, rhythmic movements and counting  to bring wt up and fwd and to maintain balance on initial standing.    PT - End of Session  Equipment Utilized During  Treatment Right knee immobilizer;Gait belt  Activity Tolerance Patient tolerated treatment well  Patient left in bed;with call bell/phone within reach;with bed alarm set;with family/visitor present  PT - Assessment/Plan  PT Plan Current plan remains appropriate  PT Frequency (ACUTE ONLY) 7X/week  Follow Up Recommendations SNF  PT equipment None recommended by PT  PT Goal Progression  Progress towards PT goals Progressing toward goals  Acute Rehab PT Goals  PT Goal Formulation With patient  Time For Goal Achievement 07/19/15  Potential to Achieve Goals Fair  PT Time Calculation  PT Start Time (ACUTE ONLY) 1225  PT Stop Time (ACUTE ONLY) 1235  PT Time Calculation (min) (ACUTE ONLY) 10 min  PT General Charges  $$ ACUTE PT VISIT 1 Procedure  PT Treatments  $Therapeutic Activity 8-22 mins

## 2015-07-15 NOTE — Progress Notes (Signed)
   Subjective: 3 Days Post-Op Procedure(s) (LRB): RIGHT TOTAL KNEE ARTHROPLASTY (Right)  Pt easily awakened c/o mild to moderate pain in the right knee Plan for SNF placement tomorrow Denies any new symptoms or issues Patient reports pain as moderate.  Objective:   VITALS:   Filed Vitals:   07/14/15 2028 07/15/15 0642  BP: 123/64 112/78  Pulse: 83 82  Temp: 99.5 F (37.5 C) 98.9 F (37.2 C)  Resp: 16 16    Right knee incision healing well nv intact distally No rashes or edema  LABS  Recent Labs  07/13/15 0407 07/14/15 0421 07/15/15 0425  HGB 12.3* 12.1* 12.0*  HCT 35.5* 34.8* 34.5*  WBC 5.9 8.5 8.5  PLT 133* 132* 128*     Recent Labs  07/12/15 1229 07/13/15 0407  NA  --  138  K  --  4.0  BUN  --  12  CREATININE 0.83 1.01  GLUCOSE  --  139*     Assessment/Plan: 3 Days Post-Op Procedure(s) (LRB): RIGHT TOTAL KNEE ARTHROPLASTY (Right) Continue PT/OT Plan for SNF placement tomorrow unless bed available today Pain management    Merla Riches, MPAS, PA-C  07/15/2015, 7:32 AM

## 2015-07-15 NOTE — Clinical Social Work Note (Signed)
Patient to be discharged to [facility]. Patient and patient's wife updated regarding discharge. Patient to be transported via [facility] (room ready at 3pm per facility).  CSW provided RN with report number.  Lubertha Sayres, [facility]: [phone] Surgical: [phone]

## 2015-07-15 NOTE — Progress Notes (Signed)
ANTICOAGULATION CONSULT NOTE - Follow Up Consult  Pharmacy Consult for Warfarin Indication: atrial fibrillation and VTE prophylaxis  Allergies  Allergen Reactions  . Lisinopril Other (See Comments)    coughing    Patient Measurements: Height: 6\' 2"  (188 cm) Weight: 250 lb (113.399 kg) IBW/kg (Calculated) : 82.2  Vital Signs: Temp: 98.9 F (37.2 C) (04/23 0642) Temp Source: Oral (04/23 0642) BP: 112/78 mmHg (04/23 [id number]) Pulse Rate: 82 (04/23 0642)  Labs:  Recent Labs  07/12/15 1229  07/13/15 0407 07/14/15 0421 07/15/15 0425  HGB  --   < > 12.3* 12.1* 12.0*  HCT  --   --  35.5* 34.8* 34.5*  PLT  --   --  133* 132* 128*  LABPROT  --   --  17.0* 21.3* 24.3*  INR  --   --  1.43 1.92* 2.30*  CREATININE 0.83  --  1.01  --   --   < > = values in this interval not displayed.  Estimated Creatinine Clearance: 87.3 mL/min (by C-G formula based on Cr of 1.01).  Assessment: 75yo M s/p right TKA 4/20. PTA warfarin, for atrial fibrillation, held prior to procedure (last dose 4/14). Pharmacy consulted to continue warfarin and manage post-op, starting POD0 at 1500 to align with patient's home dosing schedule.  PTA warfarin dose is 5mg  daily except 7.5mg  on Wednesday per 06/01/15 anticoag visit (INR 2.9).  Today, 07/15/2015:  - INR therapeutic this, rate of rise improved this am - CBC: Hgb low/stable, pltc low/stable  - Lovenox 40mg  - d/c'd by ortho 4/22 - No major drug-drug interactions  Goal of Therapy:  INR 2-3 Monitor platelets by anticoagulation protocol: Yes   Plan:   Warfarin 5mg  today at 1500 as per home dosing   INR daily  Doreene Eland, PharmD, BCPS.   Pager: [phone] 07/15/2015 10:55 AM

## 2015-07-15 NOTE — Progress Notes (Signed)
Report called to [facility] staff [place]. Otila Kluver verbalized understanding; Cleora Fleet that [facility] scheduled to pick pt up at 1:00pm

## 2015-07-15 NOTE — Clinical Social Work Placement (Signed)
   CLINICAL SOCIAL WORK PLACEMENT  NOTE  Date:  07/15/2015  Patient Details  Name: [name] MRN: [id number] Date of Birth: Mar 10, 1941  Clinical Social Work is seeking post-discharge placement for this patient at the [place] level of care (*CSW will initial, date and re-position this form in  chart as items are completed):  No   Patient/family provided with [place] Work Department's list of facilities offering this level of care within the geographic area requested by the patient (or if unable, by the patient's family).  No   Patient/family informed of their freedom to choose among providers that offer the needed level of care, that participate in Medicare, Medicaid or managed care program needed by the patient, have an available bed and are willing to accept the patient.  No   Patient/family informed of [place]'s ownership interest in [facility] and [facility], as well as of the fact that they are under no obligation to receive care at these facilities.  PASRR submitted to EDS on       PASRR number received on       Existing PASRR number confirmed on 07/13/15     FL2 transmitted to all facilities in geographic area requested by pt/family on       FL2 transmitted to all facilities within larger geographic area on       Patient informed that his/her managed care company has contracts with or will negotiate with certain facilities, including the following:  [employer] informed of bed offers received.  Patient chooses bed at       Physician recommends and patient chooses bed at [facility]    Patient to be transferred to [facility] on 07/15/15.  Patient to be transferred to facility by [facility]     Patient family notified on 07/15/15 of transfer.  Name of family member notified:  Wife     PHYSICIAN Please sign FL2     Additional Comment:     _______________________________________________ Caroline Sauger, LCSW 07/15/2015, 10:06 AM

## 2015-07-15 NOTE — Progress Notes (Signed)
Spoke with [place] PA via phone. Advised (per Dr Saunders Glance request) that pt has erythema to RLE. No new orders received.

## 2015-07-16 ENCOUNTER — Other Ambulatory Visit: Payer: Self-pay | Admitting: *Deleted

## 2015-07-16 ENCOUNTER — Telehealth: Payer: Self-pay

## 2015-07-16 ENCOUNTER — Non-Acute Institutional Stay (SKILLED_NURSING_FACILITY): Payer: Medicare Other | Admitting: Adult Health

## 2015-07-16 ENCOUNTER — Encounter: Payer: Self-pay | Admitting: Adult Health

## 2015-07-16 DIAGNOSIS — E46 Unspecified protein-calorie malnutrition: Secondary | ICD-10-CM | POA: Diagnosis not present

## 2015-07-16 DIAGNOSIS — K5901 Slow transit constipation: Secondary | ICD-10-CM | POA: Diagnosis not present

## 2015-07-16 DIAGNOSIS — K219 Gastro-esophageal reflux disease without esophagitis: Secondary | ICD-10-CM

## 2015-07-16 DIAGNOSIS — F419 Anxiety disorder, unspecified: Secondary | ICD-10-CM | POA: Diagnosis not present

## 2015-07-16 DIAGNOSIS — I10 Essential (primary) hypertension: Secondary | ICD-10-CM | POA: Diagnosis not present

## 2015-07-16 DIAGNOSIS — G20 Parkinson's disease: Secondary | ICD-10-CM

## 2015-07-16 DIAGNOSIS — G4752 REM sleep behavior disorder: Secondary | ICD-10-CM | POA: Diagnosis not present

## 2015-07-16 DIAGNOSIS — E785 Hyperlipidemia, unspecified: Secondary | ICD-10-CM

## 2015-07-16 DIAGNOSIS — N400 Enlarged prostate without lower urinary tract symptoms: Secondary | ICD-10-CM

## 2015-07-16 DIAGNOSIS — I480 Paroxysmal atrial fibrillation: Secondary | ICD-10-CM

## 2015-07-16 DIAGNOSIS — R609 Edema, unspecified: Secondary | ICD-10-CM

## 2015-07-16 DIAGNOSIS — M1711 Unilateral primary osteoarthritis, right knee: Secondary | ICD-10-CM

## 2015-07-16 DIAGNOSIS — R600 Localized edema: Secondary | ICD-10-CM

## 2015-07-16 MED ORDER — HYDROCODONE-ACETAMINOPHEN 5-325 MG PO TABS: 1.0000 | ORAL_TABLET | Freq: Four times a day (QID) | ORAL | Status: DC | PRN

## 2015-07-16 MED ORDER — TRAMADOL HCL 50 MG PO TABS: ORAL_TABLET | ORAL | Status: DC

## 2015-07-16 MED ORDER — CLONAZEPAM 0.5 MG PO TBDP: ORAL_TABLET | ORAL | Status: DC

## 2015-07-16 NOTE — Progress Notes (Addendum)
Patient ID: Brian [name], male   DOB: 1940/05/22, 75 y.o.   MRN: [id number]    DATE:  07/16/2015    MRN:  [id number]  BIRTHDAY: [birth year]  Facility:  Nursing Home Location:  [place] Room Number: 705-P  LEVEL OF CARE:  SNF (31)  Contact Information    Name Relation Home Work Mobile   [place] Spouse [phone]  [phone]       Code Status History    Date Active Date Inactive Code Status Order ID Comments User Context   11/30/2014  1:12 PM 12/03/2014  6:34 PM Full Code [id number]  Susa Day, MD Inpatient       Chief Complaint  Patient presents with  . Hospitalization Follow-up    HISTORY OF PRESENT ILLNESS:  This is a 75 year old male who has been admitted to [facility] on 07/15/15 from [facility]. He has PMH of hyperlipidemia, mitral regurgitation, parkinsonism, per his the chest, GERD, BPH, allergic rhinitis, anxiety, hypertension and atrial fibrillation. He has some right knee osteoarthritis for which he had right total knee arthroplasty on 07/12/15. He is seen in his room while sitting on the wheelchair. Noted right knee has Aquacel dressing with erythema on the peripheral sites. Aquacel is dry and skin is warm to touch.  He has been admitted for a short-term rehabilitationw  PAST MEDICAL HISTORY:  Past Medical History  Diagnosis Date  . Dyslipidemia   . Mitral regurgitation     Mild  . Abnormal PFT 1. 05/18/08  2. 11/30/08    1. Showed mild airflow obstruction, mild restriction, mild diffusion defect; FEV1 2.22(64%), FVC 3.33(65%), FEVi% 67, TLC 5.19(69%), DLCO 77%, +BD  2. FEV1 2.38(73%), FVC 3.81(80%), FEV1% 63, TLC 5.61(80%), DLCO 79%, no BD  . Parkinsonism ([place]) 06/22/2012  . Paresthesias 06/22/2012  . Hyperlipidemia   . GERD (gastroesophageal reflux disease)   . Erectile dysfunction   . BPH (benign prostatic hyperplasia)   . Restrictive lung disease   . Cellulitis     right leg MRSA  . Osteoarthritis   .  Allergic rhinitis   . Anxiety   . Hypertension   . Atrial fibrillation (HCC)     persistent afib  . Heart murmur     as a teenager   . Dysrhythmia     A FIB     CURRENT MEDICATIONS: Reviewed  Patient's Medications  New Prescriptions   No medications on file  Previous Medications   AMLODIPINE (NORVASC) 10 MG TABLET    Take 10 mg by mouth every morning. @0600    BYSTOLIC 10 MG TABLET    Take 10 mg by mouth every morning. @0600    CARBIDOPA-LEVODOPA (SINEMET CR) 50-200 MG TABLET    Take 1 tablet by mouth at bedtime.   CARBIDOPA-LEVODOPA (SINEMET IR) 25-100 MG TABLET    Take 1.5-2 tablets by mouth. Take 2 tabs at 7AM and 3PM, take 1.5 tabs at 11AM and 7PM   CHOLECALCIFEROL (VITAMIN D3) 1000 UNITS CAPS    Take 1,000 Units by mouth daily. @0700    DOCUSATE SODIUM (COLACE) 100 MG CAPSULE    Take 1 capsule (100 mg total) by mouth 2 (two) times daily.   DOXAZOSIN (CARDURA) 2 MG TABLET    TAKE THREE TABLETS BY MOUTH ONCE DAILY.   ESCITALOPRAM (LEXAPRO) 5 MG TABLET    Take 1 tablet (5 mg total) by mouth daily.   FAMOTIDINE (PEPCID) 40 MG TABLET    Take 40 mg by  mouth daily. @0600    FUROSEMIDE (LASIX) 20 MG TABLET    Take 20 mg by mouth 2 (two) times daily. @0700 , @1900    GABAPENTIN (NEURONTIN) 100 MG CAPSULE    Take 1 capsule (100 mg total) by mouth 3 (three) times daily.   KETOCONAZOLE (NIZORAL) 2 % CREAM    Apply 2 application topically 2 (two) times a week. Tuesday and Friday   KLOR-CON M10 10 MEQ TABLET    Take 1 tablet by mouth 2 (two) times daily. @0600 , @1900    MULTIPLE VITAMIN (MULTIVITAMIN WITH MINERALS) TABS TABLET    Take 1 tablet by mouth every morning. @0600    OLMESARTAN (BENICAR) 40 MG TABLET    Take 40 mg by mouth every morning. @0600    ONDANSETRON (ZOFRAN ODT) 4 MG DISINTEGRATING TABLET    4mg  ODT q6 hours prn nausea/vomit   POLYETHYLENE GLYCOL (MIRALAX / GLYCOLAX) PACKET    Take 17 g by mouth daily.   PRAVASTATIN (PRAVACHOL) 40 MG TABLET    Take 40 mg by mouth every morning.  @0600    SODIUM CHLORIDE (OCEAN) 0.65 % SOLN NASAL SPRAY    Place 2 sprays into both nostrils 4 (four) times daily as needed for congestion.  Modified Medications   Modified Medication Previous Medication   CLONAZEPAM (KLONOPIN) 0.5 MG DISINTEGRATING TABLET clonazePAM (KLONOPIN) 0.5 MG disintegrating tablet      Dissolve one tablet in mouth once daily at bedtime    DISSOLVE ONE TABLET IN MOUTH AT BEDTIME   HYDROCODONE-ACETAMINOPHEN (NORCO) 5-325 MG TABLET HYDROcodone-acetaminophen (NORCO) 5-325 MG tablet      Take 1-2 tablets by mouth every 6 (six) hours as needed for moderate pain or severe pain. Do not exceed 3gm of Tylenol in 24 hrs    Take 1-2 tablets by mouth every 6 (six) hours as needed for moderate pain or severe pain.   TRAMADOL (ULTRAM) 50 MG TABLET traMADol (ULTRAM) 50 MG tablet      Take one tablet by mouth every 6 hours as needed for mild pain    Take 1 tablet (50 mg total) by mouth every 6 (six) hours as needed (mild to moderate pain).  Discontinued Medications   CARBIDOPA-LEVODOPA (SINEMET IR) 25-100 MG TABLET    Take 2 tabs at 7am and 3pm. Take 1.5 tabs at 11am and 7pm.   WARFARIN (COUMADIN) 5 MG TABLET    Take as directed by  Coumadin clinic     Allergies  Allergen Reactions  . Lisinopril Other (See Comments)    coughing     REVIEW OF SYSTEMS:  GENERAL: no change in appetite, no fatigue, no weight changes, no fever, chills or weakness EYES: Denies change in vision, dry eyes, eye pain, itching or discharge EARS: Denies change in hearing, ringing in ears, or earache NOSE: Denies nasal congestion or epistaxis MOUTH and THROAT: Denies oral discomfort, gingival pain or bleeding, pain from teeth or hoarseness   RESPIRATORY: no cough, SOB, DOE, wheezing, hemoptysis CARDIAC: no chest pain, or palpitations GI: no abdominal pain, diarrhea, heart burn, nausea or vomiting, + constipation GU: Denies dysuria, frequency, hematuria, incontinence, or discharge PSYCHIATRIC: Denies  feeling of depression or anxiety. No report of hallucinations, insomnia, paranoia, or agitation   PHYSICAL EXAMINATION  GENERAL APPEARANCE: Well nourished. In no acute distress. Normal body habitus SKIN:  Right knee surgical site is covered with aquacel dressing, erythematous on peripheral sites, warm to touch HEAD: Normal in size and contour. No evidence of trauma EYES: Lids open and close normally. No  blepharitis, entropion or ectropion. PERRL. Conjunctivae are clear and sclerae are white. Lenses are without opacity EARS: Pinnae are normal. Patient hears normal voice tunes of the examiner MOUTH and THROAT: Lips are without lesions. Oral mucosa is moist and without lesions. Tongue is normal in shape, size, and color and without lesions NECK: supple, trachea midline, no neck masses, no thyroid tenderness, no thyromegaly LYMPHATICS: no LAN in the neck, no supraclavicular LAN RESPIRATORY: breathing is even & unlabored, BS CTAB CARDIAC: Irregularly irregular, no murmur,no extra heart sounds, RLE edema 1+ GI: abdomen soft, normal BS, no masses, no tenderness, no hepatomegaly, no splenomegaly EXTREMITIES:  Able to move X 4 extremities NEUROLOGICAL: There is no tremor. Speech is clear PSYCHIATRIC: Alert and oriented X 3. Affect and behavior are appropriate  LABS/RADIOLOGY: Labs reviewed: Basic Metabolic Panel:  Recent Labs  12/16/14 1421 07/05/15 1205 07/12/15 1229 07/13/15 0407  NA 136 138  --  138  K 3.5 4.5  --  4.0  CL 97* 103  --  103  CO2 29 28  --  27  GLUCOSE 95 94  --  139*  BUN 12 21*  --  12  CREATININE 1.10 1.16 0.83 1.01  CALCIUM 9.0 9.8  --  8.9   Liver Function Tests:  Recent Labs  12/16/14 1421  AST 23  ALT 8*  ALKPHOS 58  BILITOT 1.5*  PROT 6.5  ALBUMIN 3.2*    CBC:  Recent Labs  12/16/14 1421 07/05/15 1205 07/13/15 0407 07/14/15 0421 07/15/15 0425  WBC 7.3 3.2* 5.9 8.5 8.5  NEUTROABS 5.3 1.6*  --   --   --   HGB 11.9* 13.1 12.3* 12.1* 12.0*   HCT 34.6* 38.5* 35.5* 34.8* 34.5*  MCV 93.3 89.3 91.0 90.6 90.8  PLT 265 149* 133* 132* 128*   Cardiac Enzymes:  Recent Labs  12/16/14 1421 12/16/14 1726  TROPONINI <0.03 <0.03     Dg Knee 1-2 Views Right  07/12/2015  CLINICAL DATA:  Status post right knee replacement. EXAM: RIGHT KNEE - 1-2 VIEW COMPARISON:  July 05, 2015. FINDINGS: The femoral and tibial components appear to be well situated. Surgical drain is noted in the suprapatellar region. No fracture or dislocation is noted. IMPRESSION: Status post right total knee arthroplasty. These results will be called to the ordering clinician or representative by the Radiologist Assistant, and communication documented in the PACS or zVision Dashboard. Electronically Signed   By: Marijo Conception, M.D.   On: 07/12/2015 10:40   Dg Knee 1-2 Views Right  07/05/2015  CLINICAL DATA:  Primary osteoarthritis in knee.  Preop evaluation EXAM: RIGHT KNEE - 1-2 VIEW COMPARISON:  03/13/2011 FINDINGS: Mild spurring in the medial lateral joint margin. Advanced degenerative change in the patellofemoral joint. Negative for fracture or effusion. IMPRESSION: Degenerative change most severe in the patellofemoral joint. No acute abnormality. Electronically Signed   By: Franchot Gallo M.D.   On: 07/05/2015 14:11    ASSESSMENT/PLAN:  Osteoarthritis S/P right total knee arthroplasty - rehabilitation; continue Norco 5/325 mg 1-2 tabs by mouth every 6 hours when necessary and Ultram 50 mg 1 tab by mouth every 6 hours when necessary for pain; RLE WBAT; Coumadin 5 mg 1 tab by mouth daily for DVT prophylaxis; follow-up with Dr. Tonita Cong, orthopedic surgeon, in 2 weeks; check CBC   Parkinsonism - continue Sinemet CR 50-200 mg 1 tab by mouth daily at bedtime, Sinemet CR 25-100 mg take 2 tabs by mouth daily at 7AM and 3 PM,  Sinemet 25/100 mg take 1.5 tabs by mouth daily at 11 AM and 7PM  Anxiety - mood this is stable; continue Klonopin 0.5 mg 1 tab by mouth daily at  bedtime  Depression - continue Lexapro 5 mg 1 tab by mouth daily   GERD - continue Pepcid 40 mg 1 tab by mouth daily  Hyperlipidemia - continue atorvastatin 10 mg 1 tab by mouth every a.m.  Hypertension - continue Norvasc 10 mg 1 tab by mouth daily, losartan 100 mg 1 tab by mouth daily and Bystolic 10 mg 1 tab by mouth daily  Constipation - discontinue Colace and start senna S2 tabs by mouth twice a day and continue MiraLAX 17 g by mouth daily  BPH - change Cardura to 2 mg 1 tab by mouth 3 times a day at 7 AM, 3 PM and 7 PM    Chronic lower extremity edema - continue Lasix 20 mg 1 tab by mouth twice a day and KCl ER 10 meq 1 tab by mouth twice a day; check BMP  REM behavioral disorder - continue gabapentin 100 mg 1 capsule by mouth 3 times a day  Atrial fibrillation - rate controlled; continue Coumadin 5 mg 1 tab by mouth daily and Bystolic 10 mg 1 tab by mouth daily  Protein-calorie malnutrition - albumin 3.2; start Procel 1 scoop PO BID      Goals of care:  Short-term rehabilitation     [facility], NP [place] [phone]

## 2015-07-16 NOTE — Telephone Encounter (Signed)
Pharmacy sent in refill request for Klonopin stating that they had no refills on file. Rx renewal sent in via EPIC

## 2015-07-16 NOTE — Telephone Encounter (Signed)
[facility] 

## 2015-07-17 ENCOUNTER — Encounter: Payer: Self-pay | Admitting: Internal Medicine

## 2015-07-17 ENCOUNTER — Non-Acute Institutional Stay (SKILLED_NURSING_FACILITY): Payer: Medicare Other | Admitting: Internal Medicine

## 2015-07-17 DIAGNOSIS — I10 Essential (primary) hypertension: Secondary | ICD-10-CM | POA: Diagnosis not present

## 2015-07-17 DIAGNOSIS — G20 Parkinson's disease: Secondary | ICD-10-CM

## 2015-07-17 DIAGNOSIS — M13161 Monoarthritis, not elsewhere classified, right knee: Secondary | ICD-10-CM | POA: Diagnosis not present

## 2015-07-17 DIAGNOSIS — K219 Gastro-esophageal reflux disease without esophagitis: Secondary | ICD-10-CM

## 2015-07-17 DIAGNOSIS — I480 Paroxysmal atrial fibrillation: Secondary | ICD-10-CM | POA: Diagnosis not present

## 2015-07-17 DIAGNOSIS — R2681 Unsteadiness on feet: Secondary | ICD-10-CM

## 2015-07-17 DIAGNOSIS — K5903 Drug induced constipation: Secondary | ICD-10-CM

## 2015-07-17 DIAGNOSIS — D696 Thrombocytopenia, unspecified: Secondary | ICD-10-CM | POA: Diagnosis not present

## 2015-07-17 DIAGNOSIS — G20C Parkinsonism, unspecified: Secondary | ICD-10-CM

## 2015-07-17 DIAGNOSIS — R600 Localized edema: Secondary | ICD-10-CM

## 2015-07-17 DIAGNOSIS — D62 Acute posthemorrhagic anemia: Secondary | ICD-10-CM | POA: Diagnosis not present

## 2015-07-17 DIAGNOSIS — F418 Other specified anxiety disorders: Secondary | ICD-10-CM

## 2015-07-17 DIAGNOSIS — M1711 Unilateral primary osteoarthritis, right knee: Secondary | ICD-10-CM

## 2015-07-17 DIAGNOSIS — T402X5A Adverse effect of other opioids, initial encounter: Secondary | ICD-10-CM

## 2015-07-17 DIAGNOSIS — N400 Enlarged prostate without lower urinary tract symptoms: Secondary | ICD-10-CM

## 2015-07-17 LAB — CBC AND DIFFERENTIAL
HCT: 33 % — AB (ref 41–53)
HEMOGLOBIN: 11.1 g/dL — AB (ref 13.5–17.5)
Neutrophils Absolute: 1 /uL
PLATELETS: 178 10*3/uL (ref 150–399)
WBC: 2.6 10*3/mL

## 2015-07-17 LAB — BASIC METABOLIC PANEL
BUN: 15 mg/dL (ref 4–21)
CREATININE: 0.9 mg/dL (ref 0.6–1.3)
Glucose: 111 mg/dL
Potassium: 3.8 mmol/L (ref 3.4–5.3)
Sodium: 137 mmol/L (ref 137–147)

## 2015-07-17 NOTE — Progress Notes (Signed)
LOCATION: [facility]  PCP: Wenda Low, MD   Code Status: Full Code  Goals of care: Advanced Directive information Advanced Directives 07/12/2015  Does patient have an advance directive? Yes  Type of Paramedic of [place];Living will  Copy of advanced directive(s) in chart? Yes       Extended Emergency Contact Information Primary Emergency Contact: Straker,Barbara Address: [address]          [place], [place] [address] [place] Phone: [phone] Mobile Phone: [phone] Relation: Spouse   Allergies  Allergen Reactions  . Lisinopril Other (See Comments)    coughing    Chief Complaint  Patient presents with  . Readmit To SNF    Readmission     HPI:  Patient is a 75 y.o. male seen today for short term rehabilitation post hospital admission from 07/12/15-07/15/15 with right knee OA. He underwent right total knee arthroplasty. He is seen in his room today. His pain medication has been helpful.   Review of Systems:  Constitutional: Negative for fever, chills, diaphoresis.  HENT: Negative for headache, congestion, nasal discharge Eyes: Negative for blurred vision, double vision and discharge.  Respiratory: Negative for cough, shortness of breath and wheezing.   Cardiovascular: Negative for chest pain, palpitations, leg swelling.  Gastrointestinal: Negative for heartburn, nausea, vomiting, abdominal pain. Last bowel movement was Sunday. Passing gas. Has been constipated Genitourinary: Negative for dysuria and flank pain.  Musculoskeletal: Negative for back pain, fall in the facility.  Skin: Negative for itching, rash.  Neurological: Negative for dizziness. Psychiatric/Behavioral: Negative for depression   Past Medical History  Diagnosis Date  . Dyslipidemia   . Mitral regurgitation     Mild  . Abnormal PFT 1. 05/18/08  2. 11/30/08    1. Showed mild airflow obstruction, mild restriction, mild diffusion defect;  FEV1 2.22(64%), FVC 3.33(65%), FEVi% 67, TLC 5.19(69%), DLCO 77%, +BD  2. FEV1 2.38(73%), FVC 3.81(80%), FEV1% 63, TLC 5.61(80%), DLCO 79%, no BD  . Parkinsonism ([place]) 06/22/2012  . Paresthesias 06/22/2012  . Hyperlipidemia   . GERD (gastroesophageal reflux disease)   . Erectile dysfunction   . BPH (benign prostatic hyperplasia)   . Restrictive lung disease   . Cellulitis     right leg MRSA  . Osteoarthritis   . Allergic rhinitis   . Anxiety   . Hypertension   . Atrial fibrillation (H[facility])     persistent afib  . Heart murmur     as a teenager   . Dysrhythmia     A FIB   Past Surgical History  Procedure Laterality Date  . Cataract extraction    . Cardioversion      multiple  . Cardiac catheterization  11/2009    normal coronary arteries  . Colonoscopy    . Total knee arthroplasty Left 11/30/2014    Procedure: TOTAL KNEE ARTHROPLASTY;  Surgeon: Susa Day, MD;  Location: [facility];  Service: Orthopedics;  Laterality: Left;  . Total knee arthroplasty Right 07/12/2015    Procedure: RIGHT TOTAL KNEE ARTHROPLASTY;  Surgeon: Susa Day, MD;  Location: [facility];  Service: Orthopedics;  Laterality: Right;   Social History:   reports that he has never smoked. He has never used smokeless tobacco. He reports that he does not drink alcohol or use illicit drugs.  Family History  Problem Relation Age of Onset  . Diabetes Mother     DM  . Stroke Father     CVA  . Pancreatic  cancer Brother   . Pancreatic cancer Other     Nephew    Medications:   Medication List       This list is accurate as of: 07/17/15  1:48 PM.  Always use your most recent med list.               amLODipine 10 MG tablet  Commonly known as:  NORVASC  Take 10 mg by mouth every morning. @0600      atorvastatin 10 MG tablet  Commonly known as:  LIPITOR  Take 10 mg by mouth daily.     BYSTOLIC 10 MG tablet  Generic drug:  nebivolol  Take 10 mg by mouth every morning. @0600      carbidopa-levodopa 25-100 MG  tablet  Commonly known as:  SINEMET IR  Take 1.5-2 tablets by mouth. Take 2 tabs at 7AM and 3PM, take 1.5 tabs at 11AM and 7PM     carbidopa-levodopa 50-200 MG tablet  Commonly known as:  SINEMET CR  Take 1 tablet by mouth at bedtime.     clonazePAM 0.5 MG disintegrating tablet  Commonly known as:  KLONOPIN  Dissolve one tablet in mouth once daily at bedtime     doxazosin 2 MG tablet  Commonly known as:  CARDURA  Take 2 mg by mouth 3 (three) times daily.     escitalopram 5 MG tablet  Commonly known as:  LEXAPRO  Take 1 tablet (5 mg total) by mouth daily.     famotidine 40 MG tablet  Commonly known as:  PEPCID  Take 40 mg by mouth daily. @0600      furosemide 20 MG tablet  Commonly known as:  LASIX  Take 20 mg by mouth 2 (two) times daily. @0700 , @1900      gabapentin 100 MG capsule  Commonly known as:  NEURONTIN  Take 1 capsule (100 mg total) by mouth 3 (three) times daily.     HYDROcodone-acetaminophen 5-325 MG tablet  Commonly known as:  NORCO  Take 1-2 tablets by mouth every 6 (six) hours as needed for moderate pain or severe pain. Do not exceed 3gm of Tylenol in 24 hrs     ketoconazole 2 % cream  Commonly known as:  NIZORAL  Apply 2 application topically 2 (two) times a week. Tuesday and Friday     KLOR-CON M10 10 MEQ tablet  Generic drug:  potassium chloride  Take 1 tablet by mouth 2 (two) times daily. @0600 , @1900      losartan 100 MG tablet  Commonly known as:  COZAAR  Take 100 mg by mouth daily.     multivitamin with minerals Tabs tablet  Take 1 tablet by mouth every morning. @0600      ondansetron 4 MG disintegrating tablet  Commonly known as:  ZOFRAN ODT  4mg  ODT q6 hours prn nausea/vomit     polyethylene glycol packet  Commonly known as:  MIRALAX / GLYCOLAX  Take 17 g by mouth daily.     PROCEL 100 Powd  Take 1 scoop by mouth 2 (two) times daily.     senna 8.6 MG tablet  Commonly known as:  SENOKOT  Take 2 tablets by mouth 2 (two) times daily.      sodium chloride 0.65 % Soln nasal spray  Commonly known as:  OCEAN  Place 2 sprays into both nostrils 4 (four) times daily as needed for congestion.     traMADol 50 MG tablet  Commonly known as:  ULTRAM  Take one tablet by  mouth every 6 hours as needed for mild pain     Vitamin D3 1000 units Caps  Take 1,000 Units by mouth daily. @0700      warfarin 5 MG tablet  Commonly known as:  COUMADIN  Take 5 mg by mouth daily.        Immunizations: Immunization History  Administered Date(s) Administered  . PPD Test 07/15/2015     Physical Exam: Filed Vitals:   07/17/15 1324  BP: 107/61  Pulse: 75  Temp: 97.3 F (36.3 C)  TempSrc: Oral  Resp: 16  Height: 6\' 2"  (1.88 m)  Weight: 248 lb (112.492 kg)  SpO2: 98%   Body mass index is 31.83 kg/(m^2).  General- elderly male, obese, in no acute distress Head- normocephalic, atraumatic Nose- no maxillary or frontal sinus tenderness, no nasal discharge Throat- moist mucus membrane Eyes- PERRLA, EOMI, no pallor, no icterus, no discharge, normal conjunctiva, normal sclera Neck- no cervical lymphadenopathy Cardiovascular- normal s1,s2, no murmur, 1+ right leg edema Respiratory- bilateral clear to auscultation, no wheeze, no rhonchi, no crackles, no use of accessory muscles Abdomen- bowel sounds present, soft, non tender Musculoskeletal- able to move all 4 extremities, limited right knee range of motion Neurological- alert and oriented to person, place and time Skin- warm and dry, right knee aquacel dressing with some drainage noted, right knee warm to touch with swelling around the knee joint, has a marked area around the knee, redness has decreased since the marked day Psychiatry- normal mood and affect    Labs reviewed: Basic Metabolic Panel:  Recent Labs  12/16/14 1421 07/05/15 1205 07/12/15 1229 07/13/15 07/13/15 0407  NA 136 138  --  138 138  K 3.5 4.5  --   --  4.0  CL 97* 103  --   --  103  CO2 29 28  --   --  27    GLUCOSE 95 94  --   --  139*  BUN 12 21*  --  12 12  CREATININE 1.10 1.16 0.83 1.0 1.01  CALCIUM 9.0 9.8  --   --  8.9   Liver Function Tests:  Recent Labs  12/16/14 1421  AST 23  ALT 8*  ALKPHOS 58  BILITOT 1.5*  PROT 6.5  ALBUMIN 3.2*   No results for input(s): LIPASE, AMYLASE in the last 8760 hours. No results for input(s): AMMONIA in the last 8760 hours. CBC:  Recent Labs  12/16/14 1421 07/05/15 1205 07/13/15 0407 07/14/15 0421 07/15/15 07/15/15 0425  WBC 7.3 3.2* 5.9 8.5 8.5 8.5  NEUTROABS 5.3 1.6*  --   --   --   --   HGB 11.9* 13.1 12.3* 12.1*  --  12.0*  HCT 34.6* 38.5* 35.5* 34.8*  --  34.5*  MCV 93.3 89.3 91.0 90.6  --  90.8  PLT 265 149* 133* 132*  --  128*   Cardiac Enzymes:  Recent Labs  12/16/14 1421 12/16/14 1726  TROPONINI <0.03 <0.03   BNP: Invalid input(s): POCBNP CBG: No results for input(s): GLUCAP in the last 8760 hours.  Radiological Exams: Dg Knee 1-2 Views Right  07/12/2015  CLINICAL DATA:  Status post right knee replacement. EXAM: RIGHT KNEE - 1-2 VIEW COMPARISON:  July 05, 2015. FINDINGS: The femoral and tibial components appear to be well situated. Surgical drain is noted in the suprapatellar region. No fracture or dislocation is noted. IMPRESSION: Status post right total knee arthroplasty. These results will be called to the ordering clinician or representative by  the Radiologist Assistant, and communication documented in the PACS or zVision Dashboard. Electronically Signed   By: Marijo Conception, M.D.   On: 07/12/2015 10:40   Dg Knee 1-2 Views Right  07/05/2015  CLINICAL DATA:  Primary osteoarthritis in knee.  Preop evaluation EXAM: RIGHT KNEE - 1-2 VIEW COMPARISON:  03/13/2011 FINDINGS: Mild spurring in the medial lateral joint margin. Advanced degenerative change in the patellofemoral joint. Negative for fracture or effusion. IMPRESSION: Degenerative change most severe in the patellofemoral joint. No acute abnormality.  Electronically Signed   By: Franchot Gallo M.D.   On: 07/05/2015 14:11    Assessment/Plan  Unsteady gait Will have patient work with PT/OT as tolerated to regain strength and restore function.  Fall precautions are in place.  Right knee OA S/P right total knee arthroplasty. Continue Norco 5/325 mg 1-2 tabs q6 prn pain and tramadol 50 mg q6h prn pain. RLE WBAT. Has f/u with orthopedics. Continue coumadin for dvt prophylaxis with goal inr 2-3. Will have him work with physical therapy and occupational therapy team to help with gait training and muscle strengthening exercises.fall precautions. Skin care. Encourage to be out of bed.   Acute blood loss anemia Post op, monitor cbc  Thrombocytopenia No signs of bleed, monitor cbc  Constipation opioid induced On senna s 2 tab bid and miralax daily. Start amitiza 24 mcg bid with meals. Give dulcolax suppository if no bowel movement by tomorrow am. Discontinue senna s.  Right knee redness With warmth and swelling present, no tenderness on exam. Afebrile. The area of redness has decreased from yesterday per marking and staff report. Monitor clinically. If has fever or elevated wbc, consider empiric antibiotic coverage and orthopedic follow up  Parkinsonism  continue Sinemet as above and monitor  HTN Continue norvasc, bystolic with losartan and check bp bid  Depression and Anxiety continue lexapro 5 mg daily and Klonopin 0.5 mg daily  GERD continue Pepcid 40 mg daily  BPH Continue cardura  Chronic lower extremity edema Continue lasix and kcl, add ted hose  Atrial fibrillation  continue bystolic 10 mg daily and Coumadin 5 mg daily, f/u inr   Goals of care: short term rehabilitation   Labs/tests ordered: cbc, bmp  Family/ staff Communication: reviewed care plan with patient and nursing supervisor    Blanchie Serve, MD Internal Medicine [facility] [address] [place], [place]  [address] Cell Phone (Monday-Friday 8 am - 5 pm): [phone] On Call: [phone] and follow prompts after 5 pm and on weekends Office Phone: [phone] Office Fax: [phone]

## 2015-07-18 ENCOUNTER — Ambulatory Visit: Payer: Medicare Other | Admitting: Podiatry

## 2015-07-20 DIAGNOSIS — Z96651 Presence of right artificial knee joint: Secondary | ICD-10-CM | POA: Diagnosis not present

## 2015-07-20 DIAGNOSIS — Z471 Aftercare following joint replacement surgery: Secondary | ICD-10-CM | POA: Diagnosis not present

## 2015-07-23 LAB — CBC AND DIFFERENTIAL
HEMATOCRIT: 35 % — AB (ref 41–53)
HEMOGLOBIN: 11.4 g/dL — AB (ref 13.5–17.5)
Neutrophils Absolute: 4 /uL
Platelets: 361 10*3/uL (ref 150–399)
WBC: 6.3 10^3/mL

## 2015-07-27 DIAGNOSIS — Z471 Aftercare following joint replacement surgery: Secondary | ICD-10-CM | POA: Diagnosis not present

## 2015-07-27 DIAGNOSIS — Z96651 Presence of right artificial knee joint: Secondary | ICD-10-CM | POA: Diagnosis not present

## 2015-08-02 ENCOUNTER — Encounter: Payer: Self-pay | Admitting: Adult Health

## 2015-08-02 NOTE — Progress Notes (Signed)
Patient ID: Brian [name], male   DOB: 1940/08/31, 75 y.o.   MRN: [id number]

## 2015-08-06 DIAGNOSIS — Z471 Aftercare following joint replacement surgery: Secondary | ICD-10-CM | POA: Diagnosis not present

## 2015-08-06 DIAGNOSIS — I480 Paroxysmal atrial fibrillation: Secondary | ICD-10-CM | POA: Diagnosis not present

## 2015-08-06 DIAGNOSIS — M1990 Unspecified osteoarthritis, unspecified site: Secondary | ICD-10-CM | POA: Diagnosis not present

## 2015-08-06 DIAGNOSIS — G20 Parkinson's disease: Secondary | ICD-10-CM | POA: Diagnosis not present

## 2015-08-06 DIAGNOSIS — E46 Unspecified protein-calorie malnutrition: Secondary | ICD-10-CM | POA: Diagnosis not present

## 2015-08-06 DIAGNOSIS — I10 Essential (primary) hypertension: Secondary | ICD-10-CM | POA: Diagnosis not present

## 2015-08-07 DIAGNOSIS — I480 Paroxysmal atrial fibrillation: Secondary | ICD-10-CM | POA: Diagnosis not present

## 2015-08-07 DIAGNOSIS — G20 Parkinson's disease: Secondary | ICD-10-CM | POA: Diagnosis not present

## 2015-08-07 DIAGNOSIS — M1990 Unspecified osteoarthritis, unspecified site: Secondary | ICD-10-CM | POA: Diagnosis not present

## 2015-08-07 DIAGNOSIS — E46 Unspecified protein-calorie malnutrition: Secondary | ICD-10-CM | POA: Diagnosis not present

## 2015-08-07 DIAGNOSIS — Z471 Aftercare following joint replacement surgery: Secondary | ICD-10-CM | POA: Diagnosis not present

## 2015-08-07 DIAGNOSIS — I10 Essential (primary) hypertension: Secondary | ICD-10-CM | POA: Diagnosis not present

## 2015-08-08 DIAGNOSIS — M1990 Unspecified osteoarthritis, unspecified site: Secondary | ICD-10-CM | POA: Diagnosis not present

## 2015-08-08 DIAGNOSIS — Z471 Aftercare following joint replacement surgery: Secondary | ICD-10-CM | POA: Diagnosis not present

## 2015-08-08 DIAGNOSIS — I480 Paroxysmal atrial fibrillation: Secondary | ICD-10-CM | POA: Diagnosis not present

## 2015-08-08 DIAGNOSIS — G20 Parkinson's disease: Secondary | ICD-10-CM | POA: Diagnosis not present

## 2015-08-08 DIAGNOSIS — E46 Unspecified protein-calorie malnutrition: Secondary | ICD-10-CM | POA: Diagnosis not present

## 2015-08-08 DIAGNOSIS — I10 Essential (primary) hypertension: Secondary | ICD-10-CM | POA: Diagnosis not present

## 2015-08-09 ENCOUNTER — Ambulatory Visit (INDEPENDENT_AMBULATORY_CARE_PROVIDER_SITE_OTHER): Payer: Medicare Other

## 2015-08-09 DIAGNOSIS — G20 Parkinson's disease: Secondary | ICD-10-CM | POA: Diagnosis not present

## 2015-08-09 DIAGNOSIS — E46 Unspecified protein-calorie malnutrition: Secondary | ICD-10-CM | POA: Diagnosis not present

## 2015-08-09 DIAGNOSIS — Z5181 Encounter for therapeutic drug level monitoring: Secondary | ICD-10-CM

## 2015-08-09 DIAGNOSIS — Z471 Aftercare following joint replacement surgery: Secondary | ICD-10-CM | POA: Diagnosis not present

## 2015-08-09 DIAGNOSIS — M1990 Unspecified osteoarthritis, unspecified site: Secondary | ICD-10-CM | POA: Diagnosis not present

## 2015-08-09 DIAGNOSIS — I480 Paroxysmal atrial fibrillation: Secondary | ICD-10-CM

## 2015-08-09 DIAGNOSIS — I10 Essential (primary) hypertension: Secondary | ICD-10-CM | POA: Diagnosis not present

## 2015-08-09 LAB — POCT INR: INR: 2.6

## 2015-08-10 DIAGNOSIS — E46 Unspecified protein-calorie malnutrition: Secondary | ICD-10-CM | POA: Diagnosis not present

## 2015-08-10 DIAGNOSIS — Z471 Aftercare following joint replacement surgery: Secondary | ICD-10-CM | POA: Diagnosis not present

## 2015-08-10 DIAGNOSIS — G20 Parkinson's disease: Secondary | ICD-10-CM | POA: Diagnosis not present

## 2015-08-10 DIAGNOSIS — M1990 Unspecified osteoarthritis, unspecified site: Secondary | ICD-10-CM | POA: Diagnosis not present

## 2015-08-10 DIAGNOSIS — I10 Essential (primary) hypertension: Secondary | ICD-10-CM | POA: Diagnosis not present

## 2015-08-10 DIAGNOSIS — I480 Paroxysmal atrial fibrillation: Secondary | ICD-10-CM | POA: Diagnosis not present

## 2015-08-13 DIAGNOSIS — I10 Essential (primary) hypertension: Secondary | ICD-10-CM | POA: Diagnosis not present

## 2015-08-13 DIAGNOSIS — G20 Parkinson's disease: Secondary | ICD-10-CM | POA: Diagnosis not present

## 2015-08-13 DIAGNOSIS — Z471 Aftercare following joint replacement surgery: Secondary | ICD-10-CM | POA: Diagnosis not present

## 2015-08-13 DIAGNOSIS — E46 Unspecified protein-calorie malnutrition: Secondary | ICD-10-CM | POA: Diagnosis not present

## 2015-08-13 DIAGNOSIS — I480 Paroxysmal atrial fibrillation: Secondary | ICD-10-CM | POA: Diagnosis not present

## 2015-08-13 DIAGNOSIS — M1990 Unspecified osteoarthritis, unspecified site: Secondary | ICD-10-CM | POA: Diagnosis not present

## 2015-08-14 DIAGNOSIS — E46 Unspecified protein-calorie malnutrition: Secondary | ICD-10-CM | POA: Diagnosis not present

## 2015-08-14 DIAGNOSIS — I10 Essential (primary) hypertension: Secondary | ICD-10-CM | POA: Diagnosis not present

## 2015-08-14 DIAGNOSIS — Z471 Aftercare following joint replacement surgery: Secondary | ICD-10-CM | POA: Diagnosis not present

## 2015-08-14 DIAGNOSIS — I480 Paroxysmal atrial fibrillation: Secondary | ICD-10-CM | POA: Diagnosis not present

## 2015-08-14 DIAGNOSIS — G20 Parkinson's disease: Secondary | ICD-10-CM | POA: Diagnosis not present

## 2015-08-14 DIAGNOSIS — M1990 Unspecified osteoarthritis, unspecified site: Secondary | ICD-10-CM | POA: Diagnosis not present

## 2015-08-15 DIAGNOSIS — I10 Essential (primary) hypertension: Secondary | ICD-10-CM | POA: Diagnosis not present

## 2015-08-15 DIAGNOSIS — G20 Parkinson's disease: Secondary | ICD-10-CM | POA: Diagnosis not present

## 2015-08-15 DIAGNOSIS — Z471 Aftercare following joint replacement surgery: Secondary | ICD-10-CM | POA: Diagnosis not present

## 2015-08-15 DIAGNOSIS — E46 Unspecified protein-calorie malnutrition: Secondary | ICD-10-CM | POA: Diagnosis not present

## 2015-08-15 DIAGNOSIS — M1990 Unspecified osteoarthritis, unspecified site: Secondary | ICD-10-CM | POA: Diagnosis not present

## 2015-08-15 DIAGNOSIS — I480 Paroxysmal atrial fibrillation: Secondary | ICD-10-CM | POA: Diagnosis not present

## 2015-08-16 DIAGNOSIS — M1990 Unspecified osteoarthritis, unspecified site: Secondary | ICD-10-CM | POA: Diagnosis not present

## 2015-08-16 DIAGNOSIS — G20 Parkinson's disease: Secondary | ICD-10-CM | POA: Diagnosis not present

## 2015-08-16 DIAGNOSIS — I10 Essential (primary) hypertension: Secondary | ICD-10-CM | POA: Diagnosis not present

## 2015-08-16 DIAGNOSIS — I480 Paroxysmal atrial fibrillation: Secondary | ICD-10-CM | POA: Diagnosis not present

## 2015-08-16 DIAGNOSIS — E46 Unspecified protein-calorie malnutrition: Secondary | ICD-10-CM | POA: Diagnosis not present

## 2015-08-16 DIAGNOSIS — Z471 Aftercare following joint replacement surgery: Secondary | ICD-10-CM | POA: Diagnosis not present

## 2015-08-17 DIAGNOSIS — G20 Parkinson's disease: Secondary | ICD-10-CM | POA: Diagnosis not present

## 2015-08-17 DIAGNOSIS — M1990 Unspecified osteoarthritis, unspecified site: Secondary | ICD-10-CM | POA: Diagnosis not present

## 2015-08-17 DIAGNOSIS — Z471 Aftercare following joint replacement surgery: Secondary | ICD-10-CM | POA: Diagnosis not present

## 2015-08-17 DIAGNOSIS — I480 Paroxysmal atrial fibrillation: Secondary | ICD-10-CM | POA: Diagnosis not present

## 2015-08-17 DIAGNOSIS — I10 Essential (primary) hypertension: Secondary | ICD-10-CM | POA: Diagnosis not present

## 2015-08-17 DIAGNOSIS — E46 Unspecified protein-calorie malnutrition: Secondary | ICD-10-CM | POA: Diagnosis not present

## 2015-08-20 DIAGNOSIS — E46 Unspecified protein-calorie malnutrition: Secondary | ICD-10-CM | POA: Diagnosis not present

## 2015-08-20 DIAGNOSIS — I10 Essential (primary) hypertension: Secondary | ICD-10-CM | POA: Diagnosis not present

## 2015-08-20 DIAGNOSIS — M1990 Unspecified osteoarthritis, unspecified site: Secondary | ICD-10-CM | POA: Diagnosis not present

## 2015-08-20 DIAGNOSIS — I480 Paroxysmal atrial fibrillation: Secondary | ICD-10-CM | POA: Diagnosis not present

## 2015-08-20 DIAGNOSIS — Z471 Aftercare following joint replacement surgery: Secondary | ICD-10-CM | POA: Diagnosis not present

## 2015-08-20 DIAGNOSIS — G20 Parkinson's disease: Secondary | ICD-10-CM | POA: Diagnosis not present

## 2015-08-22 DIAGNOSIS — M1990 Unspecified osteoarthritis, unspecified site: Secondary | ICD-10-CM | POA: Diagnosis not present

## 2015-08-22 DIAGNOSIS — I10 Essential (primary) hypertension: Secondary | ICD-10-CM | POA: Diagnosis not present

## 2015-08-22 DIAGNOSIS — Z471 Aftercare following joint replacement surgery: Secondary | ICD-10-CM | POA: Diagnosis not present

## 2015-08-22 DIAGNOSIS — E46 Unspecified protein-calorie malnutrition: Secondary | ICD-10-CM | POA: Diagnosis not present

## 2015-08-22 DIAGNOSIS — I480 Paroxysmal atrial fibrillation: Secondary | ICD-10-CM | POA: Diagnosis not present

## 2015-08-22 DIAGNOSIS — G20 Parkinson's disease: Secondary | ICD-10-CM | POA: Diagnosis not present

## 2015-08-23 DIAGNOSIS — I480 Paroxysmal atrial fibrillation: Secondary | ICD-10-CM | POA: Diagnosis not present

## 2015-08-23 DIAGNOSIS — I10 Essential (primary) hypertension: Secondary | ICD-10-CM | POA: Diagnosis not present

## 2015-08-23 DIAGNOSIS — M1990 Unspecified osteoarthritis, unspecified site: Secondary | ICD-10-CM | POA: Diagnosis not present

## 2015-08-23 DIAGNOSIS — E46 Unspecified protein-calorie malnutrition: Secondary | ICD-10-CM | POA: Diagnosis not present

## 2015-08-23 DIAGNOSIS — G20 Parkinson's disease: Secondary | ICD-10-CM | POA: Diagnosis not present

## 2015-08-23 DIAGNOSIS — Z471 Aftercare following joint replacement surgery: Secondary | ICD-10-CM | POA: Diagnosis not present

## 2015-08-24 DIAGNOSIS — Z96651 Presence of right artificial knee joint: Secondary | ICD-10-CM | POA: Diagnosis not present

## 2015-08-24 DIAGNOSIS — Z471 Aftercare following joint replacement surgery: Secondary | ICD-10-CM | POA: Diagnosis not present

## 2015-08-27 DIAGNOSIS — M1711 Unilateral primary osteoarthritis, right knee: Secondary | ICD-10-CM | POA: Diagnosis not present

## 2015-08-29 DIAGNOSIS — M1711 Unilateral primary osteoarthritis, right knee: Secondary | ICD-10-CM | POA: Diagnosis not present

## 2015-08-30 ENCOUNTER — Ambulatory Visit: Payer: Medicare Other | Admitting: Neurology

## 2015-09-03 ENCOUNTER — Ambulatory Visit (INDEPENDENT_AMBULATORY_CARE_PROVIDER_SITE_OTHER): Payer: Medicare Other | Admitting: Cardiology

## 2015-09-03 ENCOUNTER — Telehealth: Payer: Self-pay | Admitting: *Deleted

## 2015-09-03 ENCOUNTER — Encounter: Payer: Self-pay | Admitting: Cardiology

## 2015-09-03 VITALS — BP 114/70 | HR 93 | Ht 73.5 in | Wt 234.6 lb

## 2015-09-03 DIAGNOSIS — I482 Chronic atrial fibrillation, unspecified: Secondary | ICD-10-CM

## 2015-09-03 DIAGNOSIS — R609 Edema, unspecified: Secondary | ICD-10-CM

## 2015-09-03 DIAGNOSIS — R600 Localized edema: Secondary | ICD-10-CM

## 2015-09-03 DIAGNOSIS — I10 Essential (primary) hypertension: Secondary | ICD-10-CM

## 2015-09-03 MED ORDER — WARFARIN SODIUM 5 MG PO TABS: ORAL_TABLET | ORAL | Status: DC

## 2015-09-03 NOTE — Telephone Encounter (Signed)
Refill done as requested 

## 2015-09-03 NOTE — Telephone Encounter (Signed)
-----   Message from Theodoro Parma, RN sent at 09/03/2015 12:08 PM EDT ----- Regarding: coumadin refills This patient would like coumadin refills sent today to [place].  Thanks!

## 2015-09-03 NOTE — Progress Notes (Signed)
Cardiology Office Note    Date:  09/03/2015   ID:  Brian [name], DOB 04/23/1940, MRN [id number]  PCP:  Brian Low, MD  Cardiologist:  Brian Him, MD   Chief Complaint  Patient presents with  . Atrial Fibrillation  . Hypertension    History of Present Illness:  Brian [name] is a 75 y.o. male with chronic atrial fibrillation, chronic anticoagulation, chronic LE edema and HTN who presents today for followup. He is doing well. He denies any chest pain, SOB, DOE, LE edema, dizziness, palpitations or syncope. His LE edema has resolved with TED hose compression stockings but dose have some knee pain due to recent bilateral knee replacements.    Past Medical History  Diagnosis Date  . Abnormal PFT 1. 05/18/08  2. 11/30/08    1. Showed mild airflow obstruction, mild restriction, mild diffusion defect; FEV1 2.22(64%), FVC 3.33(65%), FEVi% 67, TLC 5.19(69%), DLCO 77%, +BD  2. FEV1 2.38(73%), FVC 3.81(80%), FEV1% 63, TLC 5.61(80%), DLCO 79%, no BD  . Parkinsonism ([place]) 06/22/2012  . Paresthesias 06/22/2012  . Hyperlipidemia   . GERD (gastroesophageal reflux disease)   . Erectile dysfunction   . BPH (benign prostatic hyperplasia)   . Restrictive lung disease   . Cellulitis     right leg MRSA  . Osteoarthritis   . Allergic rhinitis   . Anxiety   . Hypertension   . Chronic atrial fibrillation ([place])   . Heart murmur     mild MR by echo    Past Surgical History  Procedure Laterality Date  . Cataract extraction    . Cardioversion      multiple  . Cardiac catheterization  11/2009    normal coronary arteries  . Colonoscopy    . Total knee arthroplasty Left 11/30/2014    Procedure: TOTAL KNEE ARTHROPLASTY;  Surgeon: Brian Day, MD;  Location: [facility];  Service: Orthopedics;  Laterality: Left;  . Total knee arthroplasty Right 07/12/2015    Procedure: RIGHT TOTAL KNEE ARTHROPLASTY;  Surgeon: Brian Day, MD;  Location: [facility];  Service: Orthopedics;  Laterality: Right;    Current  Medications: Outpatient Prescriptions Prior to Visit  Medication Sig Dispense Refill  . BYSTOLIC 10 MG tablet Take 10 mg by mouth every morning. @0600     . carbidopa-levodopa (SINEMET CR) 50-200 MG tablet Take 1 tablet by mouth at bedtime. 90 tablet 3  . carbidopa-levodopa (SINEMET IR) 25-100 MG tablet Take 1.5-2 tablets by mouth. Take 2 tabs at 7AM and 3PM, take 1.5 tabs at 11AM and 7PM    . Cholecalciferol (VITAMIN D3) 1000 UNITS CAPS Take 1,000 Units by mouth daily. @0700     . clonazePAM (KLONOPIN) 0.5 MG disintegrating tablet Dissolve one tablet in mouth once daily at bedtime 30 tablet 3  . docusate sodium (COLACE) 100 MG capsule Take 100 mg by mouth 2 (two) times daily.    Marland Kitchen doxazosin (CARDURA) 2 MG tablet Take 2 mg by mouth at bedtime.     Marland Kitchen escitalopram (LEXAPRO) 5 MG tablet Take 1 tablet (5 mg total) by mouth daily. 90 tablet 3  . famotidine (PEPCID) 40 MG tablet Take 40 mg by mouth daily. @0600     . furosemide (LASIX) 20 MG tablet Take 20 mg by mouth 2 (two) times daily. @0700 , @1900     . gabapentin (NEURONTIN) 100 MG capsule Take 1 capsule (100 mg total) by mouth 3 (three) times daily. 270 capsule 3  . ketoconazole (NIZORAL) 2 % cream Apply 2 application topically 2 (  two) times a week. Tuesday and Friday    . KLOR-CON M10 10 MEQ tablet Take 1 tablet by mouth daily.     . Multiple Vitamin (MULTIVITAMIN WITH MINERALS) TABS tablet Take 1 tablet by mouth every morning. @0700     . sodium chloride (OCEAN) 0.65 % SOLN nasal spray Place 2 sprays into both nostrils 4 (four) times daily as needed for congestion.    Marland Kitchen warfarin (COUMADIN) 5 MG tablet Take 5 mg by mouth daily.    Marland Kitchen amLODipine (NORVASC) 10 MG tablet Take 10 mg by mouth every morning. @0600     . atorvastatin (LIPITOR) 10 MG tablet Take 10 mg by mouth daily.    Marland Kitchen HYDROcodone-acetaminophen (NORCO) 5-325 MG tablet Take 1-2 tablets by mouth every 6 (six) hours as needed for moderate pain or severe pain. Do not exceed 3gm of Tylenol in 24  hrs 240 tablet 0  . losartan (COZAAR) 100 MG tablet Take 100 mg by mouth daily.    Marland Kitchen lubiprostone (AMITIZA) 24 MCG capsule Take 24 mcg by mouth 2 (two) times daily with a meal.    . ondansetron (ZOFRAN ODT) 4 MG disintegrating tablet 4mg  ODT q6 hours prn nausea/vomit 15 tablet 0  . Protein (PROCEL 100) POWD Take 1 scoop by mouth 2 (two) times daily.    . traMADol (ULTRAM) 50 MG tablet Take one tablet by mouth every 6 hours as needed for mild pain 120 tablet 1   No facility-administered medications prior to visit.     Allergies:   Lisinopril   Social History   Social History  . Marital Status: Married    Spouse Name: Brian [name]  . Number of Children: 4  . Years of Education: masters   Occupational History  . Benefit analyst     Worked with Brian [name]   Social History Main Topics  . Smoking status: Never Smoker   . Smokeless tobacco: Never Used  . Alcohol Use: No  . Drug Use: No  . Sexual Activity: Not Asked   Other Topics Concern  . None   Social History Narrative   Married and lives in [place].  Retired Optometrist.   No reported caffeine use.      Family History:  The patient's family history includes Diabetes in his mother; Pancreatic cancer in his brother and other; Stroke in his father.   ROS:   Please see the history of present illness.    ROS All other systems reviewed and are negative.   PHYSICAL EXAM:   VS:  BP 114/70 mmHg  Pulse 93  Ht 6' 1.5" (1.867 m)  Wt 234 lb 9.6 oz (106.414 kg)  BMI 30.53 kg/m2  SpO2 98%   GEN: Well nourished, well developed, in no acute distress HEENT: normal Neck: no JVD, carotid bruits, or masses Cardiac: RRR; no murmurs, rubs, or gallops,no edema.  Intact distal pulses bilaterally.  Respiratory:  clear to auscultation bilaterally, normal work of breathing GI: soft, nontender, nondistended, + BS MS: no deformity or atrophy Skin: warm and dry, no rash Neuro:  Alert and Oriented x 3, Strength and sensation are  intact Psych: euthymic mood, full affect  Wt Readings from Last 3 Encounters:  09/03/15 234 lb 9.6 oz (106.414 kg)  08/02/15 230 lb 6.4 oz (104.509 kg)  07/17/15 248 lb (112.492 kg)      Studies/Labs Reviewed:   EKG:  EKG is ordered today and showed atrial fibrillation with HR 66bpm with nonspecific T wave abnormality Recent Labs: 12/16/2014: ALT  8* 07/17/2015: BUN 15; Creatinine 0.9; Potassium 3.8; Sodium 137 07/23/2015: Hemoglobin 11.4*; Platelets 361   Lipid Panel No results found for: CHOL, TRIG, HDL, CHOLHDL, VLDL, LDLCALC, LDLDIRECT  Additional studies/ records that were reviewed today include:  none    ASSESSMENT:    1. Chronic atrial fibrillation ([place])   2. Essential hypertension   3. Edema extremities      PLAN:  In order of problems listed above:  1. Chronic atrial fibrillation rate controlled on current medical therapy.  Continue Bystolic and warfarin. 2. HTN - BP controlled on current medical therapy.  Continue BB, ARB and Doxazosin.   3. LE edema controlled on diuretics.      Medication Adjustments/Labs and Tests Ordered: Current medicines are reviewed at length with the patient today.  Concerns regarding medicines are outlined above.  Medication changes, Labs and Tests ordered today are listed in the Patient Instructions below.  There are no Patient Instructions on file for this visit.   Signed, Brian Him, MD  09/03/2015 8:13 AM    [address], [place], [place]  [address] Phone: [phone]; Fax: [phone]

## 2015-09-03 NOTE — Patient Instructions (Signed)

## 2015-09-05 DIAGNOSIS — M1711 Unilateral primary osteoarthritis, right knee: Secondary | ICD-10-CM | POA: Diagnosis not present

## 2015-09-07 DIAGNOSIS — M1711 Unilateral primary osteoarthritis, right knee: Secondary | ICD-10-CM | POA: Diagnosis not present

## 2015-09-12 DIAGNOSIS — M1711 Unilateral primary osteoarthritis, right knee: Secondary | ICD-10-CM | POA: Diagnosis not present

## 2015-09-14 DIAGNOSIS — Z96651 Presence of right artificial knee joint: Secondary | ICD-10-CM | POA: Diagnosis not present

## 2015-09-14 DIAGNOSIS — Z471 Aftercare following joint replacement surgery: Secondary | ICD-10-CM | POA: Diagnosis not present

## 2015-09-18 DIAGNOSIS — M1990 Unspecified osteoarthritis, unspecified site: Secondary | ICD-10-CM | POA: Diagnosis not present

## 2015-09-18 DIAGNOSIS — I10 Essential (primary) hypertension: Secondary | ICD-10-CM | POA: Diagnosis not present

## 2015-09-19 DIAGNOSIS — M1711 Unilateral primary osteoarthritis, right knee: Secondary | ICD-10-CM | POA: Diagnosis not present

## 2015-09-21 DIAGNOSIS — M1711 Unilateral primary osteoarthritis, right knee: Secondary | ICD-10-CM | POA: Diagnosis not present

## 2015-09-26 DIAGNOSIS — M1711 Unilateral primary osteoarthritis, right knee: Secondary | ICD-10-CM | POA: Diagnosis not present

## 2015-09-27 ENCOUNTER — Ambulatory Visit (INDEPENDENT_AMBULATORY_CARE_PROVIDER_SITE_OTHER): Payer: Medicare Other | Admitting: *Deleted

## 2015-09-27 DIAGNOSIS — Z5181 Encounter for therapeutic drug level monitoring: Secondary | ICD-10-CM

## 2015-09-27 DIAGNOSIS — I4891 Unspecified atrial fibrillation: Secondary | ICD-10-CM

## 2015-09-27 DIAGNOSIS — I482 Chronic atrial fibrillation, unspecified: Secondary | ICD-10-CM

## 2015-09-27 DIAGNOSIS — Z7901 Long term (current) use of anticoagulants: Secondary | ICD-10-CM

## 2015-09-27 LAB — POCT INR: INR: 2

## 2015-09-28 DIAGNOSIS — M1711 Unilateral primary osteoarthritis, right knee: Secondary | ICD-10-CM | POA: Diagnosis not present

## 2015-10-03 DIAGNOSIS — M1711 Unilateral primary osteoarthritis, right knee: Secondary | ICD-10-CM | POA: Diagnosis not present

## 2015-10-04 ENCOUNTER — Ambulatory Visit: Payer: Medicare Other | Admitting: Podiatry

## 2015-10-18 ENCOUNTER — Ambulatory Visit (INDEPENDENT_AMBULATORY_CARE_PROVIDER_SITE_OTHER): Payer: Medicare Other | Admitting: Podiatry

## 2015-10-18 ENCOUNTER — Encounter: Payer: Self-pay | Admitting: Podiatry

## 2015-10-18 DIAGNOSIS — M79676 Pain in unspecified toe(s): Secondary | ICD-10-CM | POA: Diagnosis not present

## 2015-10-18 DIAGNOSIS — B351 Tinea unguium: Secondary | ICD-10-CM | POA: Diagnosis not present

## 2015-10-18 NOTE — Progress Notes (Signed)
Patient ID: ARAGORN MENDEZ, male   DOB: [birth year], 75 y.o.   MRN: [id number] Complaint:  Visit Type: Patient returns to my office for continued preventative foot care services. Complaint: Patient states" my nails have grown long and thick and become painful to walk and wear shoes" . The patient presents for preventative foot care services. No changes to ROS  Podiatric Exam: Vascular: dorsalis pedis and posterior tibial pulses are palpable bilateral. Capillary return is immediate. Temperature gradient is WNL. Skin turgor WNL  Sensorium: Normal Semmes Weinstein monofilament test. Normal tactile sensation bilaterally. Nail Exam: Pt has thick disfigured discolored nails with subungual debris noted bilateral entire nail hallux through fifth toenails Ulcer Exam: There is no evidence of ulcer or pre-ulcerative changes or infection. Orthopedic Exam: Muscle tone and strength are WNL. No limitations in general ROM. No crepitus or effusions noted. Foot type and digits show no abnormalities. Bony prominences are unremarkable. Skin: No Porokeratosis. No infection or ulcers  Diagnosis:  Onychomycosis, , Pain in right toe, pain in left toes,    Treatment & Plan Procedures and Treatment: Consent by patient was obtained for treatment procedures. The patient understood the discussion of treatment and procedures well. All questions were answered thoroughly reviewed. Debridement of mycotic and hypertrophic toenails, 1 through 5 bilateral and clearing of subungual debris. No ulceration, no infection noted. Return Visit-Office Procedure: Patient instructed to return to the office for a follow up visit 10 weeks  for continued evaluation and treatment.   Gardiner Barefoot DPM

## 2015-10-22 ENCOUNTER — Ambulatory Visit (INDEPENDENT_AMBULATORY_CARE_PROVIDER_SITE_OTHER): Payer: Medicare Other | Admitting: Neurology

## 2015-10-22 ENCOUNTER — Encounter: Payer: Self-pay | Admitting: Neurology

## 2015-10-22 VITALS — BP 128/70 | HR 78 | Ht 73.5 in | Wt 240.0 lb

## 2015-10-22 DIAGNOSIS — G4752 REM sleep behavior disorder: Secondary | ICD-10-CM | POA: Diagnosis not present

## 2015-10-22 DIAGNOSIS — F419 Anxiety disorder, unspecified: Secondary | ICD-10-CM | POA: Diagnosis not present

## 2015-10-22 DIAGNOSIS — Z96651 Presence of right artificial knee joint: Secondary | ICD-10-CM | POA: Diagnosis not present

## 2015-10-22 DIAGNOSIS — Z96652 Presence of left artificial knee joint: Secondary | ICD-10-CM | POA: Diagnosis not present

## 2015-10-22 DIAGNOSIS — G20 Parkinson's disease: Secondary | ICD-10-CM

## 2015-10-22 MED ORDER — CARBIDOPA-LEVODOPA ER 50-200 MG PO TBCR: 1.0000 | EXTENDED_RELEASE_TABLET | Freq: Every day | ORAL | 3 refills | Status: DC

## 2015-10-22 MED ORDER — GABAPENTIN 100 MG PO CAPS: 100.0000 mg | ORAL_CAPSULE | Freq: Three times a day (TID) | ORAL | 3 refills | Status: DC

## 2015-10-22 MED ORDER — ESCITALOPRAM OXALATE 5 MG PO TABS: 5.0000 mg | ORAL_TABLET | Freq: Every day | ORAL | 3 refills | Status: DC

## 2015-10-22 MED ORDER — CARBIDOPA-LEVODOPA 25-100 MG PO TABS: 2.0000 | ORAL_TABLET | Freq: Four times a day (QID) | ORAL | 3 refills | Status: DC

## 2015-10-22 MED ORDER — CLONAZEPAM 0.5 MG PO TBDP: ORAL_TABLET | ORAL | 5 refills | Status: DC

## 2015-10-22 NOTE — Patient Instructions (Signed)
You are indeed having more difficulty with your walking and posture. This may in part be due to recent joint replacement surgeries and advancing parkinson's symptoms.   We will increase your Sinemet to 2 pills 4 times a day at this time. We will keep the CR at bedtime, around 9:30 or 10 PM.   We will continue with gabapentin at 100 mg 3 times a day.

## 2015-10-22 NOTE — Progress Notes (Signed)
Subjective:    Patient ID: Brian [name] is a 75 y.o. male.  HPI     Interim history:   Brian [name] is a 75 year old right-handed gentleman with an underlying medical history of atrial fibrillation, hypertension, OSA, and hyperlipidemia who presents for followup consultation of his right-sided predominant Parkinson's disease complicated by bladder hyperactivity, anxiety and RBD. He is accompanied by his wife today. I last saw him on 04/26/2015, at which time he reported taking carbidopa/levodopa 25-100 milligrams strength, about 7 pills per day, 2 for the 1st dose, 1 1/2 pills for the 2nd and 4 th dose, 2 for the 3rd dose, and 1 CR at night. He finished physical therapy and was able to walk without a cane. He reported right knee pain and indicated that he may need right knee surgery as well.  Today, 10/22/2015: He reports doing worse with his mobility, c/o more stiffness R leg, more freezing, difficulty getting out of bed especially at night when he has to go to the bathroom. His wife has to help him. Thankfully he has not fallen. He uses a cane. He has finished physical therapy and does not typically use his walker. He is not exercising very much, more sedentary. In the interim, he had right total knee replacement on 07/12/2015.  Previously:    I saw him on 01/18/2015, at which time he reported doing better slowly. He had finished home health physical therapy. He was supposed to start outpatient physical therapy through orthopedics. He had left total knee arthroplasty on 11/30/2014. I reviewed the operative note as well as his discharge summary. He was discharged on 12/03/2014 to inpatient rehabilitation. Of note, he presented to the emergency room on 12/16/2014 secondary to repeated vomiting. This was deemed secondary to constipation. Cardiac enzymes were tested which were negative for any acute coronary syndrome. I reviewed the emergency room records. He had blood work which I reviewed. INR was  2.59. Hemoglobin was slightly low at 11.9 and hematocrit was 34.6. Troponin was negative 2. He overall felt a little better since we increased his Sinemet. He was taking a long-acting Sinemet CR at night. He had some off time in the middle of the night.  He felt he was making slow progress after his knee replacement surgery. He had some issues with edema. He was wearing compression stockings for this. He was also placed on furosemide for a little while. He was no longer on narcotic pain medications. He was using a cane at the time. I suggested we continue with his medication regimen, he was alternating Sinemet 2 pills with 1-1/2 pills for a total of 4 doses a day.   He missed an appointment on 12/19/2014. I saw him on 08/22/2014, at which time he reported bilateral knee pain. He had seen Dr. Tonita Cong for his knee arthritis and had undergone injections into both knees. He did not think the injections helped a lot. He was exploring knee replacement surgery. He was having more difficulty getting out of chairs. He had no freezing. He did think that the increase in Sinemet had helped in the past. He was alternating 1 pill with 1-1/2 pills. He was taking Sinemet CR at night. He was on gabapentin 100 mg 3 times a day and Lexapro 5 mg daily as well as clonazepam 0.5 mg each night. He had no new memory or mood issues. He was overall moving slower. He had no recent falls.   In the interim, his wife called on 01/04/2015 reported that  he had worsening symptoms. I suggested we increase his Sinemet slightly to 2 tablets alternating with 1-1/2 tablets.   I saw him on 06/01/2014, at which time he reported overall doing well. He was not able to sleep through the night. He was consistently waking up between 3 and 4 AM. He had nocturia twice on average. He felt improved from the bladder medication but still reported disrupted sleep. He felt clonazepam has helped. His wife agreed. He also felt that the increase in Sinemet was  helpful in his motor function. It was difficult for him to keep up with the 5 pills a day schedule. He was going to the [facility] 3 times a week for about 30 minutes. Lexapro has helped his mood. His wife felt that he was less anxious. I suggested we increase his clonazepam to 0.5 mg each night. I suggested we change his Sinemet to 1 pill alternating with one half pills for a total of 5 pills daily but for doses. I suggested he continue with Sinemet CR at bedtime.   I saw him on 03/27/2014, at which time he reported sleeping a little bit better but his tremor was worse. He did not feel Sinemet was lasting him 4 hours in between 2 different doses. He was going to the gym twice a week. He was back on an antidepressant. He was supposed to start a new bladder medication. He never actually started clonazepam for RBD. I prescribed clonazepam for him for RBD. I increased his Sinemet to one fill 5 times a day.   I saw him on 12/01/2013, at which time he reported, that he stopped Myrbetric due to new onset facial swelling, but this was no better after stopping it. His facial swelling may have started after the lexapro. Adding long-acting Sinemet helped him sleep a little bit longer up to 3 AM. I suggested he stop Lexapro because of a possible allergic reaction to it. I also added low-dose clonazepam for his RBD and for his sleep.   I saw him on 09/29/2013 at which time he presented for a sooner than scheduled appointment because of increasing tremors and parkinsonian symptoms. He also felt more anxious but not frankly depressed. His wife felt that he may have been depressed. She reported dream enactments. She also reported loud snoring. He had a sleep study over 5 years ago, and that was negative for OSA at the time. He had started Myrbetriq recently. He has been taking gabapentin 3 times a day, and 7, 11 and 3. He takes Sinemet regularly at 7, 11, 3, and 7. I suggested that he start low-dose Lexapro at 5 mg strength. I  changed the timing of his gabapentin to one pill at 7 AM, 1 pill at 11 AM and one pill at 7 PM. I asked him to continue with Sinemet 4 times a day. I asked him to add a long-acting Sinemet CR 50/200 mg strength once daily at bed time. I suggested he return for a sleep study. He had a diagnostic polysomnogram on 11/09/2013 and I went over his test results with him in detail today. Sleep efficiency was reduced at 58.7% with a latency to sleep of 82 minutes and wake after sleep onset of 117 minutes with mild to moderate sleep fragmentation noted. He had an increased percentage of light stage sleep, absence of deep sleep and 16.8% of dream sleep with a prolonged REM latency. He had mild to moderate snoring. Total AHI was 3.2 per hour, he had some  lack of REM atonia.   I saw him on 05/31/2013, at which time I felt he was fairly stable. I kept him on Sinemet 4 times a day and gabapentin 100 mg 3 times a day. He reported, going up to 4 times a day with his Sinemet helped. He reported no side effects with gabapentin or Sinemet and seemed to tolerate them well. He had some residual facial tingling which was tolerable to him. He has LBP and went to [facility]. He had an Xray of the back and was told there was degenerative disease and was given and was given a 3 week taper of oral steroids, which helped. He had stopped exercising d/t back pain and was scheduled for PT evaluation through ortho. He has been on metamucil for constipation which helped.   I saw him on 11/30/2012, at which time I suggested that he continue gabapentin 100 mg 1 pill 3 times a day for his paresthesias and encouraged him to continue Sinemet with 25/100 mg strength one tablet 4 times a day, at 7, 11, 3 PM and 7 PM.    I first met him on 06/22/2012 and he presented on 08/23/2012 for a sooner than scheduled appointment because he felt his medication was not helpful. He previously followed with Dr. Jeneen Rinks love and had been complaining of  paresthesias. Dr. Erling Cruz had started him on low-dose gabapentin. For his parkinsonism which was first noted in June of 2012 he was tried on pramipexole which helped, but at his first visit with me he told me that his gabapentin was not helpful and he also discontinued pramipexole a few months prior because of swelling in his feet and ankles. This improved after stopping both medications. At the time of his first visit with me I felt he had mild parkinsonism without much in the way of lateralization. I started him on gabapentin again because of his paresthesias and also asked him to start low-dose Sinemet. He called back stating that the Sinemet was not helpful. He requested a sooner appointment. He had been continuing taking gabapentin 100 mg 3 times a day and Sinemet one pill 3 times a day with minimal improvement as he reported last time. I suggested at that visit that he increase his Sinemet to one pill 4 times a day and continue with gabapentin 100 mg 3 times a day. I also felt that he may have right sided predominant idiopathic Parkinson's disease due to an intermittent tremor noted only on the right side.      His Past Medical History Is Significant For: Past Medical History:  Diagnosis Date  . Abnormal PFT 1. 05/18/08  2. 11/30/08   1. Showed mild airflow obstruction, mild restriction, mild diffusion defect; FEV1 2.22(64%), FVC 3.33(65%), FEVi% 67, TLC 5.19(69%), DLCO 77%, +BD  2. FEV1 2.38(73%), FVC 3.81(80%), FEV1% 63, TLC 5.61(80%), DLCO 79%, no BD  . Allergic rhinitis   . Anxiety   . BPH (benign prostatic hyperplasia)   . Cellulitis    right leg MRSA  . Chronic atrial fibrillation ([place])   . Erectile dysfunction   . GERD (gastroesophageal reflux disease)   . Heart murmur    mild MR by echo  . Hyperlipidemia   . Hypertension   . Osteoarthritis   . Paresthesias 06/22/2012  . Parkinsonism ([place]) 06/22/2012  . Restrictive lung disease     His Past Surgical History Is Significant For: Past  Surgical History:  Procedure Laterality Date  . CARDIAC CATHETERIZATION  11/2009  normal coronary arteries  . CARDIOVERSION     multiple  . CATARACT EXTRACTION    . COLONOSCOPY    . TOTAL KNEE ARTHROPLASTY Left 11/30/2014   Procedure: TOTAL KNEE ARTHROPLASTY;  Surgeon: Susa Day, MD;  Location: [facility];  Service: Orthopedics;  Laterality: Left;  . TOTAL KNEE ARTHROPLASTY Right 07/12/2015   Procedure: RIGHT TOTAL KNEE ARTHROPLASTY;  Surgeon: Susa Day, MD;  Location: [facility];  Service: Orthopedics;  Laterality: Right;    His Family History Is Significant For: Family History  Problem Relation Age of Onset  . Diabetes Mother     DM  . Stroke Father     CVA  . Pancreatic cancer Brother   . Pancreatic cancer Other     Nephew    His Social History Is Significant For: Social History   Social History  . Marital status: Married    Spouse name: [name]  . Number of children: 4  . Years of education: masters   Occupational History  . Benefit analyst     Worked with [employer]   Social History Main Topics  . Smoking status: Never Smoker  . Smokeless tobacco: Never Used  . Alcohol use No  . Drug use: No  . Sexual activity: Not Asked   Other Topics Concern  . None   Social History Narrative   Married and lives in [place].  Retired Optometrist.   No reported caffeine use.     His Allergies Are:  Allergies  Allergen Reactions  . Lisinopril Other (See Comments)    coughing  :   His Current Medications Are:  Outpatient Encounter Prescriptions as of 10/22/2015  Medication Sig  . acetaminophen (TYLENOL) 500 MG tablet Take 500 mg by mouth every 6 (six) hours as needed (pain).  . BYSTOLIC 10 MG tablet Take 10 mg by mouth every evening. _0   . carbidopa-levodopa (SINEMET CR) 50-200 MG tablet Take 1 tablet by mouth at bedtime.  . carbidopa-levodopa (SINEMET IR) 25-100 MG tablet Take 1.5-2 tablets by mouth. Take 2 tabs at 7AM and 3PM, take 1.5 tabs at 11AM and 7PM   . Cholecalciferol (VITAMIN D3) 1000 UNITS CAPS Take 1,000 Units by mouth daily. _1   . clonazePAM (KLONOPIN) 0.5 MG disintegrating tablet Dissolve one tablet in mouth once daily at bedtime  . doxazosin (CARDURA) 2 MG tablet Take 2 mg by mouth at bedtime.   Marland Kitchen escitalopram (LEXAPRO) 5 MG tablet Take 1 tablet (5 mg total) by mouth daily.  . famotidine (PEPCID) 40 MG tablet Take 40 mg by mouth daily. _2   . furosemide (LASIX) 20 MG tablet Take 20 mg by mouth 2 (two) times daily. _3 , _4   . gabapentin (NEURONTIN) 100 MG capsule Take 1 capsule (100 mg total) by mouth 3 (three) times daily.  Marland Kitchen ketoconazole (NIZORAL) 2 % cream Apply 2 application topically 2 (two) times a week. Tuesday and Friday  . KLOR-CON M10 10 MEQ tablet Take 1 tablet by mouth daily.   . Multiple Vitamin (MULTIVITAMIN WITH MINERALS) TABS tablet Take 1 tablet by mouth every morning. _5   . pravastatin (PRAVACHOL) 40 MG tablet Take 1 tablet by mouth daily.  . Psyllium (METAMUCIL PO) Take by mouth as directed.  . sodium chloride (OCEAN) 0.65 % SOLN nasal spray Place 2 sprays into both nostrils 4 (four) times daily as needed for congestion.  . valsartan (DIOVAN) 160 MG tablet Take 1 tablet by mouth daily.  Marland Kitchen warfarin (COUMADIN) 5 MG tablet Take as directed by coumadin clinic  . [  DISCONTINUED] docusate sodium (COLACE) 100 MG capsule Take 100 mg by mouth 2 (two) times daily.   No facility-administered encounter medications on file as of 10/22/2015.   :  Review of Systems:  Out of a complete 14 point review of systems, all are reviewed and negative with the exception of these symptoms as listed below: Review of Systems  Neurological:       Patient has trouble getting out of bed at night. Increased pain in lower extremities.  He is taking a total of 7 tabs of Sinemet a day.     Objective:  Neurologic Exam  Physical Exam Physical Examination:   Vitals:   10/22/15 1120  BP: 128/70  Pulse: 78   General  Examination: The patient is a very pleasant 75 y.o. male in no acute distress. He appears well-developed and well-nourished and well groomed. He is overweight. He is in good spirits today.  HEENT: Normocephalic, atraumatic, pupils are equal, round and reactive to light and accommodation. Extraocular tracking shows mild saccadic breakdown without nystagmus noted. There is no limitation to gaze. There is no apraxia of eyelid opening. He has bilateral cataract repairs. There is moderate decrease in eye blink rate. Hearing is intact. Face is symmetric with moderate facial masking and normal facial sensation. There is an intermittent lip and head tremor. This is about the same. Neck is moderately rigid with intact passive ROM. There are no carotid bruits on auscultation. Oropharynx exam reveals no mouth dryness. No significant airway crowding is noted, tonsils are absent, but he does have a floppy appearing soft palate and his uvula reaches far down. Mallampati is class II. Tongue protrudes centrally and palate elevates symmetrically.    Chest: is clear to auscultation without wheezing, rhonchi or crackles noted.  Heart: sounds are irregularly irregular without murmurs, rubs or gallops noted.   Abdomen: is soft, non-tender and non-distended with normal bowel sounds appreciated on auscultation.  Extremities: There is 1 to 2+ edema in the ankles and above bilaterally, R>L, he is not wearing his  compression stockings. There are chronic appearing discolorations in the distal legs.  Skin: is warm and dry with no trophic changes noted. Chronic stasis like changes are seen in the distal legs bilateral.   Musculoskeletal: exam reveals no obvious joint deformities, tenderness or joint swelling or erythema, except for bilateral knee swelling, right side more than left, and a fairly well healing and unremarkable knee surgery scars on both knees.   Neurologically:   Mental status: The patient is awake and alert,  paying good attention. He is able to to provide a good history. His wife provides some details. He is oriented to: person, place, time/date, situation, day of week, month of year and year. His memory, attention, language and knowledge are intact. There is no aphasia, agnosia, apraxia or anomia. There is a mild degree of bradyphrenia. Speech is mildly hypophonic with no dysarthria noted. Mood is congruent and affect is normal.  Cranial nerves are as described above under HEENT exam. In addition, shoulder shrug is normal with equal shoulder height noted.  Motor exam: Normal bulk, and strength for age is noted. Tone is mild to moderately rigid with absence of cogwheeling in the bilateral extremities. Tone is increased on the R>L. There is an intermittent right upper extremity tremor at rest, unchanged. There is overall moderate bradykinesia. There is no drift or rebound. Romberg is not possible today. Reflexes are 1+ in the upper extremities and 1+ in the lower extremities. Fine  motor skills exam reveals: Finger taps are moderately b/l, right more than left, hand movements are mild to moderately impaired, right more pronounced. RAP (rapid alternating patting) is mildly impaired on the right and mildly impaired on the left. Foot taps are moderately to severely impaired on the right and moderately impaired on the left. Foot agility (in the form of heel stomping) is mildly impaired on the right and mildly impaired on the left.    Cerebellar testing shows no dysmetria or intention tremor on finger to nose testing. There is no truncal or gait ataxia.   Sensory exam is intact to light touch in the UEs and LEs.   Gait, station and balance exam: He stands up from the seated position with moderate difficulty, requiring 2 or 3 attempts, mild assistance noted, his posture is more stooped, moderate in degree and he slightly leans to the left. He stands wide-based. He walks with significant difficulty today, has several  episodes of freezing, using his cane on the right side. Tandem walk is not possible, balance is at least mildly, if not moderately impaired today.    Assessment and Plan:   In summary, SI JACHIM is a very pleasant 75 year old male with a history of atrial fibrillation, hypertension, hyperlipidemia, who presents for followup consultation of his right-sided predominant Parkinson's disease, most likely akinetic-rigid type, complicated by anxiety, mild depression, sleep disorder, including sleep fragmentation, nocturia, insomnia, obesity, arthritis, and RBD and recent status post recent left, then right total knee replacement surgeries. He is recovering from these slowly, last surgery was 07/12/2015 on the right and he believes that he has not made as quick progress as he was expecting, he was reminded that things do take time to heal and Parkinson's patients can take longer to recuperate from any type of major surgery and general anesthesia. We mutually agreed to keep most of his medications the same but I did increase his Sinemet to 2 pills 4 times a day and advised him that at some point in the near future we may increase the dose times and increase the frequency. Currently he is taking his medication about 4 hourly starting at 7 AM. I would like to continue with Sinemet CR at bedtime, Lexapro 5 mg once daily and clonazepam 0.5 mg at bedtime. He can continue with gabapentin 100 mg 3 times a day. I refilled all 5 prescriptions today. I suggested a four-month checkup, sooner as necessary. I answered all their questions today and the patient and his wife were in agreement. I spent 25 minutes in total face-to-face time with the patient, more than 50% of which was spent in counseling and coordination of care, reviewing test results, reviewing medication and discussing or reviewing the diagnosis of PD, its prognosis and treatment options.

## 2015-10-25 DIAGNOSIS — Z471 Aftercare following joint replacement surgery: Secondary | ICD-10-CM | POA: Diagnosis not present

## 2015-10-25 DIAGNOSIS — Z96651 Presence of right artificial knee joint: Secondary | ICD-10-CM | POA: Diagnosis not present

## 2015-11-08 ENCOUNTER — Ambulatory Visit (INDEPENDENT_AMBULATORY_CARE_PROVIDER_SITE_OTHER): Payer: Medicare Other

## 2015-11-08 DIAGNOSIS — I4891 Unspecified atrial fibrillation: Secondary | ICD-10-CM

## 2015-11-08 DIAGNOSIS — Z7901 Long term (current) use of anticoagulants: Secondary | ICD-10-CM

## 2015-11-08 DIAGNOSIS — Z5181 Encounter for therapeutic drug level monitoring: Secondary | ICD-10-CM | POA: Diagnosis not present

## 2015-11-08 LAB — POCT INR: INR: 3.2

## 2015-12-06 ENCOUNTER — Ambulatory Visit (INDEPENDENT_AMBULATORY_CARE_PROVIDER_SITE_OTHER): Payer: Medicare Other | Admitting: *Deleted

## 2015-12-06 DIAGNOSIS — Z7901 Long term (current) use of anticoagulants: Secondary | ICD-10-CM | POA: Diagnosis not present

## 2015-12-06 DIAGNOSIS — I4891 Unspecified atrial fibrillation: Secondary | ICD-10-CM

## 2015-12-06 DIAGNOSIS — Z5181 Encounter for therapeutic drug level monitoring: Secondary | ICD-10-CM

## 2015-12-06 LAB — POCT INR: INR: 2.8

## 2015-12-10 ENCOUNTER — Other Ambulatory Visit: Payer: Self-pay | Admitting: Cardiology

## 2015-12-19 DIAGNOSIS — I482 Chronic atrial fibrillation: Secondary | ICD-10-CM | POA: Diagnosis not present

## 2015-12-19 DIAGNOSIS — I10 Essential (primary) hypertension: Secondary | ICD-10-CM | POA: Diagnosis not present

## 2015-12-19 DIAGNOSIS — G20 Parkinson's disease: Secondary | ICD-10-CM | POA: Diagnosis not present

## 2015-12-19 DIAGNOSIS — Z23 Encounter for immunization: Secondary | ICD-10-CM | POA: Diagnosis not present

## 2015-12-19 DIAGNOSIS — N400 Enlarged prostate without lower urinary tract symptoms: Secondary | ICD-10-CM | POA: Diagnosis not present

## 2015-12-19 DIAGNOSIS — E782 Mixed hyperlipidemia: Secondary | ICD-10-CM | POA: Diagnosis not present

## 2015-12-19 DIAGNOSIS — R32 Unspecified urinary incontinence: Secondary | ICD-10-CM | POA: Diagnosis not present

## 2015-12-27 ENCOUNTER — Ambulatory Visit (INDEPENDENT_AMBULATORY_CARE_PROVIDER_SITE_OTHER): Payer: Medicare Other | Admitting: Podiatry

## 2015-12-27 ENCOUNTER — Encounter: Payer: Self-pay | Admitting: Podiatry

## 2015-12-27 VITALS — BP 145/86 | HR 69 | Resp 14

## 2015-12-27 DIAGNOSIS — M79676 Pain in unspecified toe(s): Secondary | ICD-10-CM | POA: Diagnosis not present

## 2015-12-27 DIAGNOSIS — B351 Tinea unguium: Secondary | ICD-10-CM

## 2015-12-27 NOTE — Progress Notes (Signed)
Patient ID: Brian [name], male   DOB: [birth year], 75 y.o.   MRN: [id number] Complaint:  Visit Type: Patient returns to my office for continued preventative foot care services. Complaint: Patient states" my nails have grown long and thick and become painful to walk and wear shoes" . The patient presents for preventative foot care services. No changes to ROS  Podiatric Exam: Vascular: dorsalis pedis and posterior tibial pulses are palpable bilateral. Capillary return is immediate. Temperature gradient is WNL. Skin turgor WNL  Sensorium: Normal Semmes Weinstein monofilament test. Normal tactile sensation bilaterally. Nail Exam: Pt has thick disfigured discolored nails with subungual debris noted bilateral entire nail hallux through fifth toenails Ulcer Exam: There is no evidence of ulcer or pre-ulcerative changes or infection. Orthopedic Exam: Muscle tone and strength are WNL. No limitations in general ROM. No crepitus or effusions noted. Foot type and digits show no abnormalities. Bony prominences are unremarkable. Skin: No Porokeratosis. No infection or ulcers  Diagnosis:  Onychomycosis, , Pain in right toe, pain in left toes,    Treatment & Plan Procedures and Treatment: Consent by patient was obtained for treatment procedures. The patient understood the discussion of treatment and procedures well. All questions were answered thoroughly reviewed. Debridement of mycotic and hypertrophic toenails, 1 through 5 bilateral and clearing of subungual debris. No ulceration, no infection noted. Return Visit-Office Procedure: Patient instructed to return to the office for a follow up visit 10 weeks  for continued evaluation and treatment.   Gardiner Barefoot DPM

## 2015-12-31 ENCOUNTER — Encounter (HOSPITAL_COMMUNITY): Payer: Self-pay | Admitting: Emergency Medicine

## 2015-12-31 ENCOUNTER — Inpatient Hospital Stay (HOSPITAL_COMMUNITY)
Admission: EM | Admit: 2015-12-31 | Discharge: 2016-01-04 | DRG: 725 | Disposition: A | Discharge status: Skilled Nursing Facility | Payer: Medicare Other | Attending: Internal Medicine | Admitting: Internal Medicine

## 2015-12-31 ENCOUNTER — Emergency Department (HOSPITAL_COMMUNITY): Payer: Medicare Other

## 2015-12-31 DIAGNOSIS — R791 Abnormal coagulation profile: Secondary | ICD-10-CM | POA: Diagnosis present

## 2015-12-31 DIAGNOSIS — Z800 Family history of malignant neoplasm of digestive organs: Secondary | ICD-10-CM | POA: Diagnosis not present

## 2015-12-31 DIAGNOSIS — S0990XA Unspecified injury of head, initial encounter: Secondary | ICD-10-CM | POA: Diagnosis not present

## 2015-12-31 DIAGNOSIS — R112 Nausea with vomiting, unspecified: Secondary | ICD-10-CM | POA: Diagnosis not present

## 2015-12-31 DIAGNOSIS — K219 Gastro-esophageal reflux disease without esophagitis: Secondary | ICD-10-CM | POA: Diagnosis present

## 2015-12-31 DIAGNOSIS — R2689 Other abnormalities of gait and mobility: Secondary | ICD-10-CM | POA: Diagnosis not present

## 2015-12-31 DIAGNOSIS — Z5181 Encounter for therapeutic drug level monitoring: Secondary | ICD-10-CM | POA: Diagnosis not present

## 2015-12-31 DIAGNOSIS — G9349 Other encephalopathy: Secondary | ICD-10-CM | POA: Diagnosis present

## 2015-12-31 DIAGNOSIS — G4733 Obstructive sleep apnea (adult) (pediatric): Secondary | ICD-10-CM | POA: Diagnosis present

## 2015-12-31 DIAGNOSIS — R319 Hematuria, unspecified: Secondary | ICD-10-CM | POA: Diagnosis present

## 2015-12-31 DIAGNOSIS — Z823 Family history of stroke: Secondary | ICD-10-CM | POA: Diagnosis not present

## 2015-12-31 DIAGNOSIS — I4891 Unspecified atrial fibrillation: Secondary | ICD-10-CM | POA: Diagnosis not present

## 2015-12-31 DIAGNOSIS — I129 Hypertensive chronic kidney disease with stage 1 through stage 4 chronic kidney disease, or unspecified chronic kidney disease: Secondary | ICD-10-CM | POA: Diagnosis present

## 2015-12-31 DIAGNOSIS — N401 Enlarged prostate with lower urinary tract symptoms: Principal | ICD-10-CM | POA: Diagnosis present

## 2015-12-31 DIAGNOSIS — N320 Bladder-neck obstruction: Secondary | ICD-10-CM | POA: Diagnosis present

## 2015-12-31 DIAGNOSIS — E860 Dehydration: Secondary | ICD-10-CM | POA: Diagnosis present

## 2015-12-31 DIAGNOSIS — Z9849 Cataract extraction status, unspecified eye: Secondary | ICD-10-CM

## 2015-12-31 DIAGNOSIS — R339 Retention of urine, unspecified: Secondary | ICD-10-CM | POA: Diagnosis present

## 2015-12-31 DIAGNOSIS — E785 Hyperlipidemia, unspecified: Secondary | ICD-10-CM | POA: Diagnosis present

## 2015-12-31 DIAGNOSIS — I10 Essential (primary) hypertension: Secondary | ICD-10-CM | POA: Diagnosis not present

## 2015-12-31 DIAGNOSIS — M6281 Muscle weakness (generalized): Secondary | ICD-10-CM | POA: Diagnosis not present

## 2015-12-31 DIAGNOSIS — N183 Chronic kidney disease, stage 3 (moderate): Secondary | ICD-10-CM | POA: Diagnosis not present

## 2015-12-31 DIAGNOSIS — I480 Paroxysmal atrial fibrillation: Secondary | ICD-10-CM

## 2015-12-31 DIAGNOSIS — R338 Other retention of urine: Secondary | ICD-10-CM | POA: Diagnosis not present

## 2015-12-31 DIAGNOSIS — N182 Chronic kidney disease, stage 2 (mild): Secondary | ICD-10-CM | POA: Diagnosis present

## 2015-12-31 DIAGNOSIS — R609 Edema, unspecified: Secondary | ICD-10-CM | POA: Diagnosis not present

## 2015-12-31 DIAGNOSIS — G20 Parkinson's disease: Secondary | ICD-10-CM | POA: Diagnosis present

## 2015-12-31 DIAGNOSIS — N179 Acute kidney failure, unspecified: Secondary | ICD-10-CM | POA: Diagnosis present

## 2015-12-31 DIAGNOSIS — R55 Syncope and collapse: Secondary | ICD-10-CM | POA: Diagnosis not present

## 2015-12-31 DIAGNOSIS — Z79899 Other long term (current) drug therapy: Secondary | ICD-10-CM | POA: Diagnosis not present

## 2015-12-31 DIAGNOSIS — R404 Transient alteration of awareness: Secondary | ICD-10-CM | POA: Diagnosis not present

## 2015-12-31 DIAGNOSIS — I482 Chronic atrial fibrillation: Secondary | ICD-10-CM | POA: Diagnosis present

## 2015-12-31 DIAGNOSIS — Z833 Family history of diabetes mellitus: Secondary | ICD-10-CM | POA: Diagnosis not present

## 2015-12-31 DIAGNOSIS — B359 Dermatophytosis, unspecified: Secondary | ICD-10-CM | POA: Diagnosis not present

## 2015-12-31 DIAGNOSIS — R531 Weakness: Secondary | ICD-10-CM | POA: Diagnosis not present

## 2015-12-31 DIAGNOSIS — R41841 Cognitive communication deficit: Secondary | ICD-10-CM | POA: Diagnosis not present

## 2015-12-31 DIAGNOSIS — G9340 Encephalopathy, unspecified: Secondary | ICD-10-CM | POA: Diagnosis not present

## 2015-12-31 DIAGNOSIS — N138 Other obstructive and reflux uropathy: Secondary | ICD-10-CM | POA: Diagnosis not present

## 2015-12-31 DIAGNOSIS — G9341 Metabolic encephalopathy: Secondary | ICD-10-CM | POA: Diagnosis not present

## 2015-12-31 DIAGNOSIS — Z888 Allergy status to other drugs, medicaments and biological substances status: Secondary | ICD-10-CM | POA: Diagnosis not present

## 2015-12-31 DIAGNOSIS — N3281 Overactive bladder: Secondary | ICD-10-CM | POA: Diagnosis present

## 2015-12-31 DIAGNOSIS — R410 Disorientation, unspecified: Secondary | ICD-10-CM

## 2015-12-31 DIAGNOSIS — Z7901 Long term (current) use of anticoagulants: Secondary | ICD-10-CM

## 2015-12-31 DIAGNOSIS — N200 Calculus of kidney: Secondary | ICD-10-CM | POA: Diagnosis not present

## 2015-12-31 DIAGNOSIS — Z96653 Presence of artificial knee joint, bilateral: Secondary | ICD-10-CM | POA: Diagnosis present

## 2015-12-31 DIAGNOSIS — N400 Enlarged prostate without lower urinary tract symptoms: Secondary | ICD-10-CM | POA: Diagnosis not present

## 2015-12-31 LAB — URINE MICROSCOPIC-ADD ON: Squamous Epithelial / LPF: NONE SEEN

## 2015-12-31 LAB — URINALYSIS, ROUTINE W REFLEX MICROSCOPIC
GLUCOSE, UA: NEGATIVE mg/dL
KETONES UR: NEGATIVE mg/dL
Nitrite: POSITIVE — AB
PROTEIN: NEGATIVE mg/dL
Specific Gravity, Urine: 1.017 (ref 1.005–1.030)
pH: 5 (ref 5.0–8.0)

## 2015-12-31 LAB — BLOOD GAS, ARTERIAL
ACID-BASE EXCESS: 2.1 mmol/L — AB (ref 0.0–2.0)
Bicarbonate: 26.4 mmol/L (ref 20.0–28.0)
DRAWN BY: 11249
FIO2: 0.21
O2 SAT: 94.2 %
PATIENT TEMPERATURE: 98.7
pCO2 arterial: 42.1 mmHg (ref 32.0–48.0)
pH, Arterial: 7.415 (ref 7.350–7.450)
pO2, Arterial: 73.6 mmHg — ABNORMAL LOW (ref 83.0–108.0)

## 2015-12-31 LAB — PROTIME-INR
INR: 3.3
Prothrombin Time: 34.3 seconds — ABNORMAL HIGH (ref 11.4–15.2)

## 2015-12-31 LAB — LACTIC ACID, PLASMA: LACTIC ACID, VENOUS: 1 mmol/L (ref 0.5–1.9)

## 2015-12-31 LAB — CBC WITH DIFFERENTIAL/PLATELET
BASOS PCT: 0 %
Basophils Absolute: 0 10*3/uL (ref 0.0–0.1)
EOS PCT: 0 %
Eosinophils Absolute: 0 10*3/uL (ref 0.0–0.7)
HEMATOCRIT: 39.2 % (ref 39.0–52.0)
Hemoglobin: 13.4 g/dL (ref 13.0–17.0)
Lymphocytes Relative: 6 %
Lymphs Abs: 0.5 10*3/uL — ABNORMAL LOW (ref 0.7–4.0)
MCH: 31.5 pg (ref 26.0–34.0)
MCHC: 34.2 g/dL (ref 30.0–36.0)
MCV: 92 fL (ref 78.0–100.0)
MONO ABS: 1.1 10*3/uL — AB (ref 0.1–1.0)
MONOS PCT: 12 %
NEUTROS ABS: 7.7 10*3/uL (ref 1.7–7.7)
Neutrophils Relative %: 82 %
PLATELETS: 159 10*3/uL (ref 150–400)
RBC: 4.26 MIL/uL (ref 4.22–5.81)
RDW: 13.8 % (ref 11.5–15.5)
WBC: 9.3 10*3/uL (ref 4.0–10.5)

## 2015-12-31 LAB — I-STAT TROPONIN, ED: Troponin i, poc: 0 ng/mL (ref 0.00–0.08)

## 2015-12-31 LAB — COMPREHENSIVE METABOLIC PANEL
ALBUMIN: 4.6 g/dL (ref 3.5–5.0)
ALK PHOS: 46 U/L (ref 38–126)
ALT: 6 U/L — AB (ref 17–63)
ANION GAP: 11 (ref 5–15)
AST: 25 U/L (ref 15–41)
BILIRUBIN TOTAL: 1.9 mg/dL — AB (ref 0.3–1.2)
BUN: 51 mg/dL — AB (ref 6–20)
CALCIUM: 9.8 mg/dL (ref 8.9–10.3)
CO2: 26 mmol/L (ref 22–32)
CREATININE: 4.14 mg/dL — AB (ref 0.61–1.24)
Chloride: 96 mmol/L — ABNORMAL LOW (ref 101–111)
GFR calc Af Amer: 15 mL/min — ABNORMAL LOW (ref >60)
GFR calc non Af Amer: 13 mL/min — ABNORMAL LOW (ref >60)
GLUCOSE: 119 mg/dL — AB (ref 65–99)
Potassium: 4.5 mmol/L (ref 3.5–5.1)
Sodium: 133 mmol/L — ABNORMAL LOW (ref 135–145)
TOTAL PROTEIN: 7.4 g/dL (ref 6.5–8.1)

## 2015-12-31 LAB — CK: Total CK: 225 U/L (ref 49–397)

## 2015-12-31 LAB — TROPONIN I: Troponin I: <0.03 ng/mL (ref <0.03)

## 2015-12-31 MED ORDER — CARBIDOPA-LEVODOPA ER 50-200 MG PO TBCR
1.0000 | EXTENDED_RELEASE_TABLET | Freq: Every day | ORAL | Status: DC
  Administered 2016-01-01 – 2016-01-03 (×4): 1 via ORAL
  Filled 2015-12-31 (×4): qty 1

## 2015-12-31 MED ORDER — ONDANSETRON HCL 4 MG/2ML IJ SOLN: 4.0000 mg | Freq: Four times a day (QID) | INTRAMUSCULAR | Status: DC | PRN

## 2015-12-31 MED ORDER — ONDANSETRON HCL 4 MG PO TABS: 4.0000 mg | ORAL_TABLET | Freq: Four times a day (QID) | ORAL | Status: DC | PRN

## 2015-12-31 MED ORDER — SODIUM CHLORIDE 0.9% FLUSH
3.0000 mL | Freq: Two times a day (BID) | INTRAVENOUS | Status: DC
Start: 2015-12-31 — End: 2016-01-04
  Administered 2016-01-01 – 2016-01-04 (×7): 3 mL via INTRAVENOUS

## 2015-12-31 MED ORDER — ACETAMINOPHEN 325 MG PO TABS: 650.0000 mg | ORAL_TABLET | Freq: Four times a day (QID) | ORAL | Status: DC | PRN

## 2015-12-31 MED ORDER — ACETAMINOPHEN 650 MG RE SUPP: 650.0000 mg | Freq: Four times a day (QID) | RECTAL | Status: DC | PRN

## 2015-12-31 MED ORDER — PRAVASTATIN SODIUM 40 MG PO TABS
40.0000 mg | ORAL_TABLET | Freq: Every day | ORAL | Status: DC
  Administered 2016-01-01 – 2016-01-03 (×3): 40 mg via ORAL
  Filled 2015-12-31 (×3): qty 1

## 2015-12-31 MED ORDER — SODIUM CHLORIDE 0.9 % IV BOLUS (SEPSIS)
1000.0000 mL | Freq: Once | INTRAVENOUS | Status: AC
  Administered 2015-12-31: 1000 mL via INTRAVENOUS

## 2015-12-31 MED ORDER — CARBIDOPA-LEVODOPA 25-100 MG PO TABS
2.0000 | ORAL_TABLET | Freq: Four times a day (QID) | ORAL | Status: DC
  Administered 2016-01-01 – 2016-01-04 (×14): 2 via ORAL
  Filled 2015-12-31 (×12): qty 2
  Filled 2015-12-31 (×2): qty 1
  Filled 2015-12-31: qty 2

## 2015-12-31 MED ORDER — FAMOTIDINE 20 MG PO TABS
40.0000 mg | ORAL_TABLET | Freq: Every day | ORAL | Status: DC
  Administered 2016-01-01: 40 mg via ORAL
  Filled 2015-12-31: qty 2

## 2015-12-31 MED ORDER — NEBIVOLOL HCL 10 MG PO TABS
10.0000 mg | ORAL_TABLET | Freq: Every evening | ORAL | Status: DC
  Administered 2016-01-01 – 2016-01-03 (×3): 10 mg via ORAL
  Filled 2015-12-31 (×4): qty 1

## 2015-12-31 MED ORDER — SODIUM CHLORIDE 0.9 % IV SOLN
Freq: Once | INTRAVENOUS | Status: AC
  Administered 2015-12-31: 22:00:00 via INTRAVENOUS

## 2015-12-31 MED ORDER — DOXAZOSIN MESYLATE 2 MG PO TABS
2.0000 mg | ORAL_TABLET | Freq: Every day | ORAL | Status: DC
  Administered 2016-01-01 – 2016-01-03 (×4): 2 mg via ORAL
  Filled 2015-12-31 (×4): qty 1

## 2015-12-31 MED ORDER — ESCITALOPRAM OXALATE 10 MG PO TABS
5.0000 mg | ORAL_TABLET | Freq: Every day | ORAL | Status: DC
  Administered 2016-01-01 – 2016-01-03 (×4): 5 mg via ORAL
  Filled 2015-12-31 (×4): qty 1

## 2015-12-31 NOTE — ED Triage Notes (Signed)
Patient here via EMS from home with complaints of weakness for 2 days. Possible UTI. Increase in falls. Hx of Parkinson.

## 2015-12-31 NOTE — ED Notes (Signed)
Bed: [id number] Expected date:  Expected time:  Means of arrival:  Comments: 75 yo, M, Fall

## 2015-12-31 NOTE — H&P (Signed)
Brian [name] [id number] DOB: 01-11-1941 DOA: 12/31/2015     PCP: Wenda Low, MD   Outpatient Specialists: Neurology [place], Cardiology Fransico Him, Urology  [place] Patient coming from:    home Lives   With family wife and daughters    Chief Complaint: confusion nausea and vomiting  HPI: Brian [name] is a 75 y.o. male with medical history significant of benign prostatic hyperplasia , A. fib, Parkinson's disease, HTN, OSA, hyperlipidemia, bladder hyperactivity    Presented with 3 day history of worsening nausea vomiting and confusion at baseline patient is alert and oriented. He's been having progressive diffuse fatigue. Have been having straining to urinate states "Peeing urine Balls. 2 weeks  he has been started on Oxybutin because of frequent urination after he developed retention and it was discontinued.   Since then his urinary retention has increased. He's been having decreased appetite. Denies any fevers or chills patient today had an episode of fall   Fall was unwitnessed by family. Family states  when he fell he was clammy. Family unsure if he had a syncopal event. Family states he was trying to go to the bathroom and fell backwards. She was endorsing suprapubic pain for the past few days Regarding pertinent Chronic problems: For his Parkinson disease patient followed by neurology and taking carbidopa levodopa his right side is more affected than his left Patient has known history of atrial fibrillation currently on Coumadin rate controlled with bysystolic  IN ER:  Temp ([id number]), Avg:97.9 F (36.6 C), Min:97.3 F (36.3 C), Max:98.5 F (36.9 C)    BP 161/79 satting 97% arrhythmia INR 3.3 WBC 9.3 hemoglobin 13.4 NA 133 BUN 51 CR 4.1 which is up from baseline of 0.9 in April  Patient had 600 mL residual in his bladder and  Foley  catheter was placed  Following Medications were ordered in ER: Medications  0.9 %  sodium chloride infusion (not administered)  sodium  chloride 0.9 % bolus 1,000 mL (1,000 mLs Intravenous New Bag/Given 12/31/15 1951)      Hospitalist was called for admission for Urinary retention acute encephalopathy and acute renal failure  Review of Systems:    Pertinent positives include: Confusion, nausea, vomiting, fall, abdominal pain,    Constitutional:  No weight loss, night sweats, Fevers, chills, fatigue, weight loss  HEENT:  No headaches, Difficulty swallowing,Tooth/dental problems,Sore throat,  No sneezing, itching, ear ache, nasal congestion, post nasal drip,  Cardio-vascular:  No chest pain, Orthopnea, PND, anasarca, dizziness, palpitations.no Bilateral lower extremity swelling  GI:  No heartburn, indigestiondiarrhea, change in bowel habits, loss of appetite, melena, blood in stool, hematemesis Resp:  no shortness of breath at rest. No dyspnea on exertion, No excess mucus, no productive cough, No non-productive cough, No coughing up of blood.No change in color of mucus.No wheezing. Skin:  no rash or lesions. No jaundice GU:  no dysuria, change in color of urine, no urgency or frequency. No straining to urinate.  No flank pain.  Musculoskeletal:  No joint pain or no joint swelling. No decreased range of motion. No back pain.  Psych:  No change in mood or affect. No depression or anxiety. No memory loss.  Neuro: no localizing neurological complaints, no tingling, no weakness, no double vision, no gait abnormality, no slurred speech,    As per HPI otherwise 10 point review of systems negative.   Past Medical History: Past Medical History:  Diagnosis Date  . Abnormal PFT 1. 05/18/08  2. 11/30/08   1.  Showed mild airflow obstruction, mild restriction, mild diffusion defect; FEV1 2.22(64%), FVC 3.33(65%), FEVi% 67, TLC 5.19(69%), DLCO 77%, +BD  2. FEV1 2.38(73%), FVC 3.81(80%), FEV1% 63, TLC 5.61(80%), DLCO 79%, no BD  . Allergic rhinitis   . Anxiety   . BPH (benign prostatic hyperplasia)   . Cellulitis    right leg  MRSA  . Chronic atrial fibrillation ([place])   . Erectile dysfunction   . GERD (gastroesophageal reflux disease)   . Heart murmur    mild MR by echo  . Hyperlipidemia   . Hypertension   . Osteoarthritis   . Paresthesias 06/22/2012  . Parkinsonism ([place]) 06/22/2012  . Restrictive lung disease    Past Surgical History:  Procedure Laterality Date  . CARDIAC CATHETERIZATION  11/2009   normal coronary arteries  . CARDIOVERSION     multiple  . CATARACT EXTRACTION    . COLONOSCOPY    . TOTAL KNEE ARTHROPLASTY Left 11/30/2014   Procedure: TOTAL KNEE ARTHROPLASTY;  Surgeon: Susa Day, MD;  Location: [facility];  Service: Orthopedics;  Laterality: Left;  . TOTAL KNEE ARTHROPLASTY Right 07/12/2015   Procedure: RIGHT TOTAL KNEE ARTHROPLASTY;  Surgeon: Susa Day, MD;  Location: [facility];  Service: Orthopedics;  Laterality: Right;     Social History:  Ambulatory  , walker      reports that he has never smoked. He has never used smokeless tobacco. He reports that he does not drink alcohol or use drugs.  Allergies:   Allergies  Allergen Reactions  . Lisinopril Cough    Family History:   Family History  Problem Relation Age of Onset  . Diabetes Mother     DM  . Stroke Father     CVA  . Pancreatic cancer Brother   . Pancreatic cancer Other     Nephew    Medications: Prior to Admission medications   Medication Sig Start Date End Date Taking? Authorizing Provider  carbidopa-levodopa (SINEMET CR) 50-200 MG tablet Take 1 tablet by mouth at bedtime. 10/22/15  Yes Star Age, MD  carbidopa-levodopa (SINEMET IR) 25-100 MG tablet Take 2 tablets by mouth 4 (four) times daily. 10/22/15  Yes Star Age, MD  cholecalciferol (VITAMIN D) 1000 units tablet Take 1,000 Units by mouth daily.   Yes Historical Provider, MD  clonazePAM (KLONOPIN) 0.5 MG disintegrating tablet Take 0.5 mg by mouth at bedtime.   Yes Historical Provider, MD  diphenhydramine-acetaminophen (TYLENOL PM) 25-500 MG TABS tablet  Take 1-2 tablets by mouth at bedtime as needed (for sleep/pain).   Yes Historical Provider, MD  doxazosin (CARDURA) 2 MG tablet Take 2 mg by mouth at bedtime.    Yes Historical Provider, MD  escitalopram (LEXAPRO) 5 MG tablet Take 5 mg by mouth at bedtime.   Yes Historical Provider, MD  famotidine (PEPCID) 40 MG tablet Take 40 mg by mouth daily.    Yes Historical Provider, MD  furosemide (LASIX) 20 MG tablet Take 20 mg by mouth daily.    Yes Historical Provider, MD  gabapentin (NEURONTIN) 100 MG capsule Take 1 capsule (100 mg total) by mouth 3 (three) times daily. 10/22/15  Yes Star Age, MD  Multiple Vitamin (MULTIVITAMIN WITH MINERALS) TABS tablet Take 1 tablet by mouth daily.    Yes Historical Provider, MD  naproxen sodium (ANAPROX) 220 MG tablet Take 440 mg by mouth at bedtime as needed (for pain).   Yes Historical Provider, MD  nebivolol (BYSTOLIC) 10 MG tablet Take 10 mg by mouth every evening.  Yes Historical Provider, MD  potassium chloride (K-DUR,KLOR-CON) 10 MEQ tablet Take 10 mEq by mouth daily.   Yes Historical Provider, MD  pravastatin (PRAVACHOL) 40 MG tablet Take 40 mg by mouth daily.    Yes Historical Provider, MD  psyllium (HYDROCIL/METAMUCIL) 95 % PACK Take 1 packet by mouth daily.   Yes Historical Provider, MD  valsartan (DIOVAN) 160 MG tablet Take 160 mg by mouth daily.    Yes Historical Provider, MD  warfarin (COUMADIN) 5 MG tablet Take 5 mg by mouth every evening. Pt DOES NOT take this dose on Wednesday.   Yes Historical Provider, MD  warfarin (COUMADIN) 7.5 MG tablet Take 7.5 mg by mouth every evening.   Yes Historical Provider, MD    Physical Exam: Patient Vitals for the past 24 hrs:  BP Temp Temp src Pulse Resp SpO2 Weight  12/31/15 1914 161/79 - - 84 18 97 % -  12/31/15 1856 171/91 97.3 F (36.3 C) Oral 74 15 96 % -  12/31/15 1630 - - - - - - 108.9 kg (240 lb)  12/31/15 1629 146/86 98.5 F (36.9 C) - 84 17 98 % -    1. General:  in No Acute distress 2.  Psychological: somnolent but arousable to stimulation, follows comands   not Oriented 3. Head/ENT:     Dry Mucous Membranes                          Head Non traumatic, neck supple                           Poor Dentition 4. SKIN:  decreased Skin turgor,  Skin clean Dry and intact no rash 5. Heart: Regular rate and rhythm no  Murmur, Rub or gallop 6. Lungs no wheezes or crackles   7. Abdomen: Soft,  non-tender, Non distended 8. Lower extremities: no clubbing, cyanosis, or edema 9. Neurologically Grossly intact, moving all 4 extremities equally 10. MSK: Normal range of motion   body mass index is 31.23 kg/m.  Labs on Admission:   Labs on Admission: I have personally reviewed following labs and imaging studies  CBC:  Recent Labs Lab 12/31/15 1733  WBC 9.3  NEUTROABS 7.7  HGB 13.4  HCT 39.2  MCV 92.0  PLT [id number]   Basic Metabolic Panel:  Recent Labs Lab 12/31/15 1733  NA 133*  K 4.5  CL 96*  CO2 26  GLUCOSE 119*  BUN 51*  CREATININE 4.14*  CALCIUM 9.8   GFR: Estimated Creatinine Clearance: 20.7 mL/min (by C-G formula based on SCr of 4.14 mg/dL (H)). Liver Function Tests:  Recent Labs Lab 12/31/15 1733  AST 25  ALT 6*  ALKPHOS 46  BILITOT 1.9*  PROT 7.4  ALBUMIN 4.6   No results for input(s): LIPASE, AMYLASE in the last 168 hours. No results for input(s): AMMONIA in the last 168 hours. Coagulation Profile:  Recent Labs Lab 12/31/15 1940  INR 3.30   Cardiac Enzymes: No results for input(s): CKTOTAL, CKMB, CKMBINDEX, TROPONINI in the last 168 hours. BNP (last 3 results) No results for input(s): PROBNP in the last 8760 hours. HbA1C: No results for input(s): HGBA1C in the last 72 hours. CBG: No results for input(s): GLUCAP in the last 168 hours. Lipid Profile: No results for input(s): CHOL, HDL, LDLCALC, TRIG, CHOLHDL, LDLDIRECT in the last 72 hours. Thyroid Function Tests: No results for input(s): TSH, T4TOTAL, FREET4, T3FREE,  THYROIDAB in the last  72 hours. Anemia Panel: No results for input(s): VITAMINB12, FOLATE, FERRITIN, TIBC, IRON, RETICCTPCT in the last 72 hours. Urine analysis:    Component Value Date/Time   COLORURINE RED (A) 12/31/2015 1900   APPEARANCEUR CLEAR 12/31/2015 1900   LABSPEC 1.017 12/31/2015 1900   PHURINE 5.0 12/31/2015 1900   GLUCOSEU NEGATIVE 12/31/2015 1900   HGBUR TRACE (A) 12/31/2015 1900   BILIRUBINUR SMALL (A) 12/31/2015 1900   KETONESUR NEGATIVE 12/31/2015 1900   PROTEINUR NEGATIVE 12/31/2015 1900   UROBILINOGEN 1.0 11/23/2014 1455   NITRITE POSITIVE (A) 12/31/2015 1900   LEUKOCYTESUR SMALL (A) 12/31/2015 1900   Sepsis Labs: @LABRCNTIP (procalcitonin:4,lacticidven:4) )No results found for this or any previous visit (from the past 240 hour(s)).     UA Nitrite positive likely secondary to medication  No results found for: HGBA1C  Estimated Creatinine Clearance: 20.7 mL/min (by C-G formula based on SCr of 4.14 mg/dL (H)).  BNP (last 3 results) No results for input(s): PROBNP in the last 8760 hours.   ECG REPORT  Independently reviewed Rate: 99  Rhythm: Atrial fibrillation ST&T Change: No acute ischemic changes   QTC 470  Filed Weights   12/31/15 1630  Weight: 108.9 kg (240 lb)     Cultures: No results found for: SDES, SPECREQUEST, CULT, REPTSTATUS   Radiological Exams on Admission: Ct Head Wo Contrast  Result Date: 12/31/2015 CLINICAL DATA:  Confusion, weakness for 2 days. Recent fall striking back of head. EXAM: CT HEAD WITHOUT CONTRAST TECHNIQUE: Contiguous axial images were obtained from the base of the skull through the vertex without intravenous contrast. COMPARISON:  Brain MRI 10/07/2011 FINDINGS: Brain: No evidence of acute infarction, hemorrhage, hydrocephalus, extra-axial collection or mass lesion/mass effect. Generalized volume loss, with mild progression from July 2013 MRI. Moderate chronic small vessel ischemia. Vascular: No hyperdense vessel or unexpected calcification.  Atherosclerosis of skullbase vasculature. Skull: Normal. Negative for fracture or focal lesion. Sinuses/Orbits: No acute finding. Other: None. IMPRESSION: 1.  No acute intracranial abnormality. 2. Mild increase in generalized atrophy from 2013. Chronic small vessel ischemia is stable. Electronically Signed   By: Jeb Levering M.D.   On: 12/31/2015 22:46    Chart has been reviewed    Assessment/Plan  75 y.o. male with medical history significant of benign prostatic hyperplasia , A. fib, Parkinson's disease, HTN, OSA, hyperlipidemia, bladder hyperactivity being admitted for Urinary retention acute encephalopathy and acute renal failure  Present on Admission: . Acute encephalopathy - Most likely secondary to acute renal failure and dehydration with nausea and vomiting. Given that patient had a fall and on anticoagulation will obtain CT of the head. No localizing neurological complaints if mental status does not improve despite rehydration would consider further imaging. We'll check ABG . Atrial fibrillation (HCC) and tinea by systolic hold Coumadin until CT of the head has been done currently therapeutic we'll need to resume Coumadin once INR falls is no evidence of bleeding  . CKD (chronic kidney disease) stage 2, GFR 60-89 ml/min currently acute on chronic renal failure in the setting of urinary retention . Essential hypertension continue by systolic . Parkinsonism ([place]) continue levodopa carbidopa . Urinary retention 40 in place patient will need outpatient follow-up with urology . Acute renal failure (ARF) ([place]) in a setting of urinary retention most likely postobstructive. Obtain renal ultrasound hematuria discussed with urology who felt this is likely secondary bladder wall distention and should resolve. They recommended to call him back in the morning if continues to have significant  hematuria Questionable syncopal event. - Since unclear history patient had a syncopal versus a fall we will  monitor on telemetry cycle cardiac enzymes obtain echogram most likely in the setting of dehydration secondary to recurrent nausea and vomiting secondary to urinary retention  Other plan as per orders.  DVT prophylaxis:  coumadin  Code Status:  FULL CODE   as per  family   Family Communication:   Family   at  Bedside  plan of care was discussed with   Daughter Crystal jones [phone], Wife Clevie Do [phone] Disposition Plan:   To home once workup is complete and patient is stable                       Would benefit from PT/OT eval prior to DC                                               Consults called: discussed with Urology   Admission status:   inpatient       Level of care    tele         I have spent a total of 56 min on this admission     Loraina Stauffer 12/31/2015, 10:57 PM    [facility]  Pager [phone]   after 2 AM please page floor coverage PA If 7AM-7PM, please contact the day team taking care of the patient  [email or web address]  Password [email or web address]

## 2015-12-31 NOTE — ED Provider Notes (Addendum)
[place] DEPT Provider Note   CSN: [id number] Arrival date & time: 12/31/15  1613     History   Chief Complaint Chief Complaint  Patient presents with  . Weakness  . Urinary Tract Infection    HPI Brian [name] is a 75 y.o. male.  HPI Brian [name] is a 75 y.o. male with history of benign prostatic hyperplasia , A. fib, Parkinson's disease, presents to emergency department complaining of increased confusion, weakness, urinary symptoms, nausea, vomiting. Patient is accompanied by his daughter. Patient lives with his wife, his 2 daughters, his grandchildren. Patient has had nausea and vomiting for the last 3 days. Butch Penny denies diarrhea. Patient has been increasingly confused. Patient with worsening weakness, unable to get up and walk today. Patient also complained that he was "Peeing urine balls" on Saturday. He wanted to come to the hospital during that time, however the daughter stated that he did not want to come to the ER because he was dizzy. Patient recently started on BPH medication which he took himself off of. Pt also has had decreased appetite and energy. No fever. Today, daughter states pt was walking to the bathroom, but fell. Unsure if LOC vs fall. Pt does have unsteady gait. Pt states he "tripped over my feet." Pt's sister was at the house and was able to help him up. Pt did not hit his head.   Past Medical History:  Diagnosis Date  . Abnormal PFT 1. 05/18/08  2. 11/30/08   1. Showed mild airflow obstruction, mild restriction, mild diffusion defect; FEV1 2.22(64%), FVC 3.33(65%), FEVi% 67, TLC 5.19(69%), DLCO 77%, +BD  2. FEV1 2.38(73%), FVC 3.81(80%), FEV1% 63, TLC 5.61(80%), DLCO 79%, no BD  . Allergic rhinitis   . Anxiety   . BPH (benign prostatic hyperplasia)   . Cellulitis    right leg MRSA  . Chronic atrial fibrillation ([place])   . Erectile dysfunction   . GERD (gastroesophageal reflux disease)   . Heart murmur    mild MR by echo  . Hyperlipidemia   .  Hypertension   . Osteoarthritis   . Paresthesias 06/22/2012  . Parkinsonism ([place]) 06/22/2012  . Restrictive lung disease     Patient Active Problem List   Diagnosis Date Noted  . Primary osteoarthritis of right knee 07/12/2015  . Primary osteoarthritis of left knee 11/30/2014  . Encounter for therapeutic drug monitoring 06/21/2013  . Edema extremities 02/04/2013  . Chronic anticoagulation 02/04/2013  . GERD (gastroesophageal reflux disease)   . Restrictive lung disease   . CKD (chronic kidney disease) stage 2, GFR 60-89 ml/min   . Cellulitis   . Osteoarthritis   . Allergic rhinitis   . Anxiety   . Long term (current) use of anticoagulants 01/25/2013  . Parkinsonism ([place]) 06/22/2012  . Paresthesias 06/22/2012  . Atrial fibrillation ([place]) 10/14/2010  . PULMONARY NODULE 06/02/2008  . SNORING 06/02/2008  . Hyperlipidemia 05/15/2008  . Essential hypertension 05/15/2008  . PULMONARY FUNCTION TESTS, ABNORMAL 05/15/2008    Past Surgical History:  Procedure Laterality Date  . CARDIAC CATHETERIZATION  11/2009   normal coronary arteries  . CARDIOVERSION     multiple  . CATARACT EXTRACTION    . COLONOSCOPY    . TOTAL KNEE ARTHROPLASTY Left 11/30/2014   Procedure: TOTAL KNEE ARTHROPLASTY;  Surgeon: Susa Day, MD;  Location: [facility];  Service: Orthopedics;  Laterality: Left;  . TOTAL KNEE ARTHROPLASTY Right 07/12/2015   Procedure: RIGHT TOTAL KNEE ARTHROPLASTY;  Surgeon: Susa Day, MD;  Location: [facility];  Service: Orthopedics;  Laterality: Right;       Home Medications    Prior to Admission medications   Medication Sig Start Date End Date Taking? Authorizing Provider  acetaminophen (TYLENOL) 500 MG tablet Take 500 mg by mouth every 6 (six) hours as needed (pain).    Historical Provider, MD  BYSTOLIC 10 MG tablet Take 10 mg by mouth every evening. @0600  08/07/14   Historical Provider, MD  carbidopa-levodopa (SINEMET CR) 50-200 MG tablet Take 1 tablet by mouth at bedtime. 10/22/15    Star Age, MD  carbidopa-levodopa (SINEMET IR) 25-100 MG tablet Take 2 tablets by mouth 4 (four) times daily. 10/22/15   Star Age, MD  Cholecalciferol (VITAMIN D3) 1000 UNITS CAPS Take 1,000 Units by mouth daily. @0700     Historical Provider, MD  clonazePAM (KLONOPIN) 0.5 MG disintegrating tablet Dissolve one tablet in mouth once daily at bedtime 10/22/15   Star Age, MD  doxazosin (CARDURA) 2 MG tablet Take 2 mg by mouth at bedtime.     Historical Provider, MD  escitalopram (LEXAPRO) 5 MG tablet Take 1 tablet (5 mg total) by mouth daily. 10/22/15   Star Age, MD  famotidine (PEPCID) 40 MG tablet Take 40 mg by mouth daily. @0600  11/19/12   Historical Provider, MD  furosemide (LASIX) 20 MG tablet Take 20 mg by mouth 2 (two) times daily. @0700 , @1900  05/31/12   Historical Provider, MD  gabapentin (NEURONTIN) 100 MG capsule Take 1 capsule (100 mg total) by mouth 3 (three) times daily. 10/22/15   Star Age, MD  ketoconazole (NIZORAL) 2 % cream Apply 2 application topically 2 (two) times a week. Tuesday and Friday 09/18/13   Historical Provider, MD  KLOR-CON M10 10 MEQ tablet Take 1 tablet by mouth daily.  09/05/13   Historical Provider, MD  Multiple Vitamin (MULTIVITAMIN WITH MINERALS) TABS tablet Take 1 tablet by mouth every morning. @0700     Historical Provider, MD  pravastatin (PRAVACHOL) 40 MG tablet Take 1 tablet by mouth daily. 09/02/15   Historical Provider, MD  Psyllium (METAMUCIL PO) Take by mouth as directed.    Historical Provider, MD  sodium chloride (OCEAN) 0.65 % SOLN nasal spray Place 2 sprays into both nostrils 4 (four) times daily as needed for congestion.    Historical Provider, MD  valsartan (DIOVAN) 160 MG tablet Take 1 tablet by mouth daily. 08/13/15   Historical Provider, MD  warfarin (COUMADIN) 5 MG tablet TAKE AS DIRECTED BY COUMADIN CLINIC. 12/11/15   Sueanne Margarita, MD    Family History Family History  Problem Relation Age of Onset  . Diabetes Mother     DM  . Stroke  Father     CVA  . Pancreatic cancer Brother   . Pancreatic cancer Other     Nephew    Social History Social History  Substance Use Topics  . Smoking status: Never Smoker  . Smokeless tobacco: Never Used  . Alcohol use No     Allergies   Lisinopril   Review of Systems Review of Systems  Constitutional: Positive for activity change and appetite change. Negative for chills and fever.  Respiratory: Negative for cough, chest tightness and shortness of breath.   Cardiovascular: Negative for chest pain, palpitations and leg swelling.  Gastrointestinal: Positive for nausea and vomiting. Negative for abdominal distention, abdominal pain and diarrhea.  Genitourinary: Positive for difficulty urinating. Negative for dysuria, frequency, hematuria and urgency.  Musculoskeletal: Negative for arthralgias, myalgias, neck pain and neck  stiffness.  Skin: Negative for rash.  Allergic/Immunologic: Negative for immunocompromised state.  Neurological: Negative for dizziness, syncope, weakness, light-headedness, numbness and headaches.  Psychiatric/Behavioral: Positive for confusion.  All other systems reviewed and are negative.    Physical Exam Updated Vital Signs BP 146/86 (BP Location: Right Arm)   Pulse 84   Temp 98.5 F (36.9 C)   Resp 17   Wt 108.9 kg   SpO2 98%   BMI 31.23 kg/m   Physical Exam  Constitutional: He is oriented to person, place, and time. He appears well-developed and well-nourished. No distress.  HENT:  Head: Normocephalic and atraumatic.  Eyes: Conjunctivae and EOM are normal. Pupils are equal, round, and reactive to light.  Neck: Neck supple.  Cardiovascular: Normal rate, regular rhythm and normal heart sounds.   Pulmonary/Chest: Effort normal. No respiratory distress. He has no wheezes. He has no rales.  Abdominal: Soft. Bowel sounds are normal. He exhibits no distension. There is no tenderness. There is no rebound.  Bladder palpated just below umbilicus    Musculoskeletal: He exhibits no edema.  Neurological: He is alert and oriented to person, place, and time. No cranial nerve deficit. Coordination normal.  5/5 and equal upper and lower extremity strength bilaterally. Equal grip strength bilaterally. Normal finger to nose and heel to shin. No pronator drift.   Skin: Skin is warm and dry. Capillary refill takes less than 2 seconds.  Nursing note and vitals reviewed.    ED Treatments / Results  Labs (all labs ordered are listed, but only abnormal results are displayed) Labs Reviewed  CBC WITH DIFFERENTIAL/PLATELET - Abnormal; Notable for the following:       Result Value   Lymphs Abs 0.5 (*)    Monocytes Absolute 1.1 (*)    All other components within normal limits  COMPREHENSIVE METABOLIC PANEL - Abnormal; Notable for the following:    Sodium 133 (*)    Chloride 96 (*)    Glucose, Bld 119 (*)    BUN 51 (*)    Creatinine, Ser 4.14 (*)    ALT 6 (*)    Total Bilirubin 1.9 (*)    GFR calc non Af Amer 13 (*)    GFR calc Af Amer 15 (*)    All other components within normal limits  URINALYSIS, ROUTINE W REFLEX MICROSCOPIC (NOT AT [facility]) - Abnormal; Notable for the following:    Color, Urine RED (*)    Hgb urine dipstick TRACE (*)    Bilirubin Urine SMALL (*)    Nitrite POSITIVE (*)    Leukocytes, UA SMALL (*)    All other components within normal limits  PROTIME-INR - Abnormal; Notable for the following:    Prothrombin Time 34.3 (*)    All other components within normal limits  URINE MICROSCOPIC-ADD ON - Abnormal; Notable for the following:    Bacteria, UA RARE (*)    All other components within normal limits  URINE CULTURE  I-STAT TROPOININ, ED    EKG  EKG Interpretation None       Radiology No results found.  Procedures Procedures (including critical care time)  Medications Ordered in ED Medications  0.9 %  sodium chloride infusion (not administered)  sodium chloride 0.9 % bolus 1,000 mL (1,000 mLs Intravenous  New Bag/Given 12/31/15 1951)     Initial Impression / Assessment and Plan / ED Course  I have reviewed the triage vital signs and the nursing notes.  Pertinent labs & imaging results that were available  during my care of the patient were reviewed by me and considered in my medical decision making (see chart for details).  Clinical Course   5:30PM Pt seen and examined pt with increased confusion, nausea, vomiting, urinary symptoms. Will check labs, UA. VS normal at this time.   8:59 PM Patient is bladder scanner showed 600 mL of urine. He was unable to urinate. Foley was placed by RN. Patient's white blood cell count and CBC unremarkable. His creatinine however is 4, patient's baseline creatinine is 1. Urinalysis is positive for nitrate, however it is red in color due to Azo that he has been taking. It has rare bacteria, no WBCs, culture was sent. Patient was given IV fluids. Will admit for acute kidney injury. Question dehydration versus urinary retention.  Spoke with [facility] hospitalist. Will admit.   Vitals:   12/31/15 1629 12/31/15 1630 12/31/15 1856 12/31/15 1914  BP: 146/86  171/91 161/79  Pulse: 84  74 84  Resp: 17  15 18   Temp: 98.5 F (36.9 C)  97.3 F (36.3 C)   TempSrc:   Oral   SpO2: 98%  96% 97%  Weight:  108.9 kg       Final Clinical Impressions(s) / ED Diagnoses   Final diagnoses:  Acute kidney injury [facility])  Urinary retention    New Prescriptions New Prescriptions   No medications on file     Janee Morn 12/31/15 2101    Milton Ferguson, MD 12/31/15 2226    Jeannett Senior, PA-C 01/26/16 1351    Milton Ferguson, MD 01/28/16 1506

## 2016-01-01 ENCOUNTER — Inpatient Hospital Stay (HOSPITAL_COMMUNITY): Payer: Medicare Other

## 2016-01-01 DIAGNOSIS — R609 Edema, unspecified: Secondary | ICD-10-CM

## 2016-01-01 DIAGNOSIS — G9341 Metabolic encephalopathy: Secondary | ICD-10-CM

## 2016-01-01 DIAGNOSIS — R55 Syncope and collapse: Secondary | ICD-10-CM

## 2016-01-01 DIAGNOSIS — R319 Hematuria, unspecified: Secondary | ICD-10-CM

## 2016-01-01 DIAGNOSIS — N19 Unspecified kidney failure: Secondary | ICD-10-CM

## 2016-01-01 DIAGNOSIS — N139 Obstructive and reflux uropathy, unspecified: Secondary | ICD-10-CM

## 2016-01-01 LAB — COMPREHENSIVE METABOLIC PANEL
ALBUMIN: 3.5 g/dL (ref 3.5–5.0)
ALT: <5 U/L — ABNORMAL LOW (ref 17–63)
ANION GAP: 7 (ref 5–15)
AST: 21 U/L (ref 15–41)
Alkaline Phosphatase: 38 U/L (ref 38–126)
BILIRUBIN TOTAL: 1.3 mg/dL — AB (ref 0.3–1.2)
BUN: 44 mg/dL — ABNORMAL HIGH (ref 6–20)
CO2: 27 mmol/L (ref 22–32)
Calcium: 9.2 mg/dL (ref 8.9–10.3)
Chloride: 104 mmol/L (ref 101–111)
Creatinine, Ser: 2.45 mg/dL — ABNORMAL HIGH (ref 0.61–1.24)
GFR calc Af Amer: 28 mL/min — ABNORMAL LOW (ref >60)
GFR calc non Af Amer: 25 mL/min — ABNORMAL LOW (ref >60)
GLUCOSE: 107 mg/dL — AB (ref 65–99)
POTASSIUM: 3.9 mmol/L (ref 3.5–5.1)
SODIUM: 138 mmol/L (ref 135–145)
TOTAL PROTEIN: 6.1 g/dL — AB (ref 6.5–8.1)

## 2016-01-01 LAB — CBC
HEMATOCRIT: 34.8 % — AB (ref 39.0–52.0)
HEMOGLOBIN: 11.9 g/dL — AB (ref 13.0–17.0)
MCH: 31.4 pg (ref 26.0–34.0)
MCHC: 34.2 g/dL (ref 30.0–36.0)
MCV: 91.8 fL (ref 78.0–100.0)
Platelets: 142 10*3/uL — ABNORMAL LOW (ref 150–400)
RBC: 3.79 MIL/uL — AB (ref 4.22–5.81)
RDW: 13.9 % (ref 11.5–15.5)
WBC: 7 10*3/uL (ref 4.0–10.5)

## 2016-01-01 LAB — MAGNESIUM: MAGNESIUM: 1.9 mg/dL (ref 1.7–2.4)

## 2016-01-01 LAB — TROPONIN I: Troponin I: <0.03 ng/mL (ref <0.03)

## 2016-01-01 LAB — CREATININE, URINE, RANDOM: Creatinine, Urine: 153.46 mg/dL

## 2016-01-01 LAB — ECHOCARDIOGRAM COMPLETE
HEIGHTINCHES: 74 in
WEIGHTICAEL: 3721.6 [oz_av]

## 2016-01-01 LAB — SODIUM, URINE, RANDOM: Sodium, Ur: 27 mmol/L

## 2016-01-01 LAB — PHOSPHORUS: PHOSPHORUS: 3.3 mg/dL (ref 2.5–4.6)

## 2016-01-01 LAB — TSH: TSH: 0.713 u[IU]/mL (ref 0.350–4.500)

## 2016-01-01 MED ORDER — ORAL CARE MOUTH RINSE
15.0000 mL | Freq: Two times a day (BID) | OROMUCOSAL | Status: DC
  Administered 2016-01-01 – 2016-01-03 (×5): 15 mL via OROMUCOSAL

## 2016-01-01 MED ORDER — CHLORHEXIDINE GLUCONATE 0.12 % MT SOLN
15.0000 mL | Freq: Two times a day (BID) | OROMUCOSAL | Status: DC
  Administered 2016-01-01 – 2016-01-04 (×6): 15 mL via OROMUCOSAL
  Filled 2016-01-01 (×7): qty 15

## 2016-01-01 MED ORDER — TAMSULOSIN HCL 0.4 MG PO CAPS
0.4000 mg | ORAL_CAPSULE | Freq: Every day | ORAL | Status: DC
  Administered 2016-01-01: 0.4 mg via ORAL
  Filled 2016-01-01: qty 1

## 2016-01-01 MED ORDER — ENSURE ENLIVE PO LIQD
237.0000 mL | Freq: Two times a day (BID) | ORAL | Status: DC
  Administered 2016-01-01 – 2016-01-03 (×5): 237 mL via ORAL

## 2016-01-01 MED ORDER — FAMOTIDINE 20 MG PO TABS
20.0000 mg | ORAL_TABLET | Freq: Every day | ORAL | Status: DC
  Administered 2016-01-02 – 2016-01-04 (×3): 20 mg via ORAL
  Filled 2016-01-01 (×3): qty 1

## 2016-01-01 NOTE — Progress Notes (Signed)
Patient had a 2.11 sec pause at 23:51 while sleeping.  NP on call was notified via text page. No new orders at this time; will continue to monitor patient.

## 2016-01-01 NOTE — Progress Notes (Addendum)
PROGRESS NOTE                                                                                                                                                                                                             Patient Demographics:    Brian [name], is a 75 y.o. male, DOB - 1940/08/23, [id number]  Admit date - 12/31/2015   Admitting Physician Toy Baker, MD  Outpatient Primary MD for the patient is Wenda Low, MD  LOS - 1  Outpatient Specialists: Dr Eskridge(urology)   Chief Complaint  Patient presents with  . Weakness  . Urinary Tract Infection       Brief Narrative   75 year old male with history of BPH, A. fib on Coumadin, Parkinson's disease, hypertension, OSA, hyperactive bladder presented with three-day history of worsening nausea with vomiting and increased confusion. Patient reports increasing fatigue for the past 2 weeks and reports being "urine balls" . Reports his urine to be dark but not sure if he had hematuria. Also complained of suprapubic pain for the past few days. He reports increased urinary retention for the past week. He was on oxybutynin for urinary frequency but developed retention and was discontinued. Patient reported poor appetite and also had an unwitnessed fall at home. On the day of admission he felt very clammy and fell on his back while going to the bathroom. Denied loss of consciousness.  In the ED  vitals were stable. Blood work showed progressive 9.3, hemoglobin of 13.4, acute kidney injury with BUN of 51 and creatinine 4.1 (normal at baseline). INR was 2.3. A Foley catheter was placed in the ED and hospitalist admission requested.  Subjective:    Patient reports feeling weak but denies any nausea or vomiting. He seems oriented to baseline.   Assessment  & Plan :   Super problem Acute uremic encephalopathy Secondary to bladder outlet obstruction. Has good  urine output after Foley placed in. Mental status returned to baseline. Renal ultrasound negative for obstruction or hydronephrosis. I will start him on Flomax.  Spoke With his urologist Dr. Junious Silk. Agrees with and continuing Foley upon discharge, starting Flomax and have him follow-up as outpatient in 2-3 weeks. If hematuria persistent or severe pain should be consulted for inpatient evaluation.   Active Problems:  Acute kidney injury Secondary to obstructive uropathy. Improving after Foley placed in.  add gentle hydration.  Hematuria Possibly from Foley. Trace hemoglobin seen on UA. Monitor for now. Consult urologist for inpatient evaluation is persistent or severe. Monitor H&H.    Essential hypertension Stable. Continue Cardura and bystolic Hold valsartan and Lasix.   Atrial fibrillation (HCC)    Parkinsonism (H[facility]) Resumed Sinemet. Follows with outpatient neurology.  A. fib Holding Coumadin given supra therapeutic INR and hematuria. Monitor closely.  Generalized weakness with fall at home Head CT negative for acute injury. PT evaluation recommends SNF     Code Status : Full code  Family Communication  : None at bedside  Disposition Plan  : SNF for PT. Possibly in the next 48 hours if renal function continues to improve and no further hematuria..  Barriers For Discharge : Active symptoms  Consults  :  Urologist consulted  Procedures  : Renal ultrasound  DVT Prophylaxis  : Supratherapeutic INR on Coumadin.    Lab Results  Component Value Date   PLT 142 (L) 01/01/2016    Antibiotics  :    Anti-infectives    None        Objective:   Vitals:   12/31/15 1914 12/31/15 2218 12/31/15 2309 01/01/16 0530  BP: 161/79 132/74 138/75 132/74  Pulse: 84 (!) 55 77 65  Resp: 18 16 19 18   Temp:   98.5 F (36.9 C) 98.2 F (36.8 C)  TempSrc:   Oral Oral  SpO2: 97% 95% 98% 97%  Weight:   105.5 kg (232 lb 9.6 oz)   Height:   6\' 2"  (1.88 m)     Wt Readings from Last 3  Encounters:  12/31/15 105.5 kg (232 lb 9.6 oz)  10/22/15 108.9 kg (240 lb)  09/03/15 106.4 kg (234 lb 9.6 oz)     Intake/Output Summary (Last 24 hours) at 01/01/16 1307 Last data filed at 01/01/16 0615  Gross per 24 hour  Intake                0 ml  Output             1300 ml  Net            -1300 ml     Physical Exam  Gen: not in distress HEENT: no pallor, moist mucosa, supple neck Chest: clear b/l, no added sounds CVS: N S1&S2, no murmurs, rubs or gallop GI: soft, NT, ND, BS+ Musculoskeletal: warm, no edema [id number] and oriented, nonfocal   Data Review:    CBC  Recent Labs Lab 12/31/15 1733 01/01/16 0404  WBC 9.3 7.0  HGB 13.4 11.9*  HCT 39.2 34.8*  PLT 159 142*  MCV 92.0 91.8  MCH 31.5 31.4  MCHC 34.2 34.2  RDW 13.8 13.9  LYMPHSABS 0.5*  --   MONOABS 1.1*  --   EOSABS 0.0  --   BASOSABS 0.0  --     Chemistries   Recent Labs Lab 12/31/15 1733 01/01/16 0404  NA 133* 138  K 4.5 3.9  CL 96* 104  CO2 26 27  GLUCOSE 119* 107*  BUN 51* 44*  CREATININE 4.14* 2.45*  CALCIUM 9.8 9.2  MG  --  1.9  AST 25 21  ALT 6* <5*  ALKPHOS 46 38  BILITOT 1.9* 1.3*   ------------------------------------------------------------------------------------------------------------------ No results for input(s): CHOL, HDL, LDLCALC, TRIG, CHOLHDL, LDLDIRECT in the last 72 hours.  No results found for: HGBA1C ------------------------------------------------------------------------------------------------------------------  Recent Labs  01/01/16 0404  TSH 0.713   ------------------------------------------------------------------------------------------------------------------ No results for input(s):  VITAMINB12, FOLATE, FERRITIN, TIBC, IRON, RETICCTPCT in the last 72 hours.  Coagulation profile  Recent Labs Lab 12/31/15 1940  INR 3.30    No results for input(s): DDIMER in the last 72 hours.  Cardiac Enzymes  Recent Labs Lab 12/31/15 2229 01/01/16 0404  01/01/16 0932  TROPONINI <0.03 <0.03 <0.03   ------------------------------------------------------------------------------------------------------------------ No results found for: BNP  Inpatient Medications  Scheduled Meds: . carbidopa-levodopa  1 tablet Oral QHS  . carbidopa-levodopa  2 tablet Oral QID  . chlorhexidine  15 mL Mouth Rinse BID  . doxazosin  2 mg Oral QHS  . escitalopram  5 mg Oral QHS  . [START ON 01/02/2016] famotidine  20 mg Oral Daily  . mouth rinse  15 mL Mouth Rinse q12n4p  . nebivolol  10 mg Oral QPM  . pravastatin  40 mg Oral Daily  . sodium chloride flush  3 mL Intravenous Q12H   Continuous Infusions:  PRN Meds:.acetaminophen **OR** acetaminophen, ondansetron **OR** ondansetron (ZOFRAN) IV  Micro Results No results found for this or any previous visit (from the past 240 hour(s)).  Radiology Reports Ct Head Wo Contrast  Result Date: 12/31/2015 CLINICAL DATA:  Confusion, weakness for 2 days. Recent fall striking back of head. EXAM: CT HEAD WITHOUT CONTRAST TECHNIQUE: Contiguous axial images were obtained from the base of the skull through the vertex without intravenous contrast. COMPARISON:  Brain MRI 10/07/2011 FINDINGS: Brain: No evidence of acute infarction, hemorrhage, hydrocephalus, extra-axial collection or mass lesion/mass effect. Generalized volume loss, with mild progression from July 2013 MRI. Moderate chronic small vessel ischemia. Vascular: No hyperdense vessel or unexpected calcification. Atherosclerosis of skullbase vasculature. Skull: Normal. Negative for fracture or focal lesion. Sinuses/Orbits: No acute finding. Other: None. IMPRESSION: 1.  No acute intracranial abnormality. 2. Mild increase in generalized atrophy from 2013. Chronic small vessel ischemia is stable. Electronically Signed   By: Jeb Levering M.D.   On: 12/31/2015 22:46   US Renal  Result Date: 01/01/2016 CLINICAL DATA:  Inpatient with acute renal failure. EXAM: RENAL /  URINARY TRACT ULTRASOUND COMPLETE COMPARISON:  04/26/2012 CT abdomen/pelvis. FINDINGS: Right Kidney: Length: 12.4 cm. Echogenicity within normal limits. No mass or hydronephrosis visualized. Left Kidney: Length: 11.9 cm. Echogenicity within normal limits. No mass or hydronephrosis visualized. Bladder: Bladder is incompletely collapsed by indwelling Foley catheter. No definite bladder wall thickening. IMPRESSION: 1. No hydronephrosis.  Unremarkable kidneys. 2. Bladder is incompletely collapsed by indwelling Foley catheter. No definite bladder wall thickening. Electronically Signed   By: Ilona Sorrel M.D.   On: 01/01/2016 12:26    Time Spent in minutes  35   Guillermo Difrancesco M.D on 01/01/2016 at 1:07 PM  Between 7am to 7pm - Pager - [phone]  After 7pm go to [email or web address] - password [facility]  [facility] -  Office  [phone]

## 2016-01-01 NOTE — Progress Notes (Signed)
Patient had a 2.51 sec pause at 01:56 am while sleeping. Will continue to monitor patient.

## 2016-01-01 NOTE — Progress Notes (Signed)
*  PRELIMINARY RESULTS* Vascular Ultrasound Lower extremity venous duplex has been completed.  Preliminary findings: No evidence of DVT or baker's cyst.  Landry Mellow, RDMS, RVT  01/01/2016, 10:43 AM

## 2016-01-01 NOTE — Progress Notes (Signed)
  Echocardiogram 2D Echocardiogram has been performed.  Diamond Nickel 01/01/2016, 3:46 PM

## 2016-01-01 NOTE — [place] FL2 (Signed)
[place] MEDICAID FL2 LEVEL OF CARE SCREENING TOOL     IDENTIFICATION  Patient Name: Brian [name] Birthdate: 05/01/1940 Sex: male Admission Date (Current Location): 12/31/2015  [facility] and [place] Number:  Herbalist and Address:  [facility],  [place] [address], [place]      Provider Number: [id number]  Attending Physician Name and Address:  Louellen Molder, MD  Relative Name and Phone Number:       Current Level of Care: Hospital Recommended Level of Care: [place] Prior Approval Number:    Date Approved/Denied:   PASRR Number: [id number]  Discharge Plan: SNF    Current Diagnoses: Patient Active Problem List   Diagnosis Date Noted  . Urinary retention 12/31/2015  . Acute renal failure (ARF) ([place]) 12/31/2015  . Acute encephalopathy 12/31/2015  . Primary osteoarthritis of right knee 07/12/2015  . Primary osteoarthritis of left knee 11/30/2014  . Encounter for therapeutic drug monitoring 06/21/2013  . Edema extremities 02/04/2013  . Chronic anticoagulation 02/04/2013  . GERD (gastroesophageal reflux disease)   . Restrictive lung disease   . CKD (chronic kidney disease) stage 2, GFR 60-89 ml/min   . Cellulitis   . Osteoarthritis   . Allergic rhinitis   . Anxiety   . Long term (current) use of anticoagulants 01/25/2013  . Parkinsonism ([place]) 06/22/2012  . Paresthesias 06/22/2012  . Atrial fibrillation ([place]) 10/14/2010  . PULMONARY NODULE 06/02/2008  . SNORING 06/02/2008  . Hyperlipidemia 05/15/2008  . Essential hypertension 05/15/2008  . PULMONARY FUNCTION TESTS, ABNORMAL 05/15/2008    Orientation RESPIRATION BLADDER Height & Weight     Self, Time, Situation, Place  Normal Indwelling catheter, Incontinent Weight: 232 lb 9.6 oz (105.5 kg) Height:  6\' 2"  (188 cm)  BEHAVIORAL SYMPTOMS/MOOD NEUROLOGICAL BOWEL NUTRITION STATUS      Continent Diet (Heart)  AMBULATORY STATUS COMMUNICATION OF NEEDS Skin    Extensive Assist Verbally Normal                       Personal Care Assistance Level of Assistance  Bathing, Dressing, Feeding Bathing Assistance: Limited assistance Feeding assistance: Limited assistance Dressing Assistance: Limited assistance     Functional Limitations Info             SPECIAL CARE FACTORS FREQUENCY  PT (By licensed PT), OT (By licensed OT)     PT Frequency: 5 OT Frequency: 5            Contractures      Additional Factors Info  Code Status, Allergies Code Status Info: Fullcode Allergies Info: Lisinopril           Current Medications (01/01/2016):  This is the current hospital active medication list Current Facility-Administered Medications  Medication Dose Route Frequency Provider Last Rate Last Dose  . acetaminophen (TYLENOL) tablet 650 mg  650 mg Oral Q6H PRN Toy Baker, MD       Or  . acetaminophen (TYLENOL) suppository 650 mg  650 mg Rectal Q6H PRN Toy Baker, MD      . carbidopa-levodopa (SINEMET CR) 50-200 MG per tablet controlled release 1 tablet  1 tablet Oral QHS Toy Baker, MD   1 tablet at 01/01/16 0031  . carbidopa-levodopa (SINEMET IR) 25-100 MG per tablet immediate release 2 tablet  2 tablet Oral QID Toy Baker, MD   2 tablet at 01/01/16 1359  . chlorhexidine (PERIDEX) 0.12 % solution 15 mL  15 mL Mouth Rinse BID Anastassia Doutova,  MD   15 mL at 01/01/16 0933  . doxazosin (CARDURA) tablet 2 mg  2 mg Oral QHS Toy Baker, MD   2 mg at 01/01/16 0031  . escitalopram (LEXAPRO) tablet 5 mg  5 mg Oral QHS Toy Baker, MD   5 mg at 01/01/16 0032  . [START ON 01/02/2016] famotidine (PEPCID) tablet 20 mg  20 mg Oral Daily Nishant Dhungel, MD      . feeding supplement (ENSURE ENLIVE) (ENSURE ENLIVE) liquid 237 mL  237 mL Oral BID BM Maricela Bo [place], RD   237 mL at 01/01/16 1359  . MEDLINE mouth rinse  15 mL Mouth Rinse q12n4p Toy Baker, MD   15 mL at 01/01/16 1600  . nebivolol  (BYSTOLIC) tablet 10 mg  10 mg Oral QPM Toy Baker, MD      . ondansetron (ZOFRAN) tablet 4 mg  4 mg Oral Q6H PRN Toy Baker, MD       Or  . ondansetron (ZOFRAN) injection 4 mg  4 mg Intravenous Q6H PRN Toy Baker, MD      . pravastatin (PRAVACHOL) tablet 40 mg  40 mg Oral Daily Toy Baker, MD      . sodium chloride flush (NS) 0.9 % injection 3 mL  3 mL Intravenous Q12H Toy Baker, MD   3 mL at 01/01/16 0933  . tamsulosin (FLOMAX) capsule 0.4 mg  0.4 mg Oral QPC supper Nishant Dhungel, MD         Discharge Medications: Please see discharge summary for a list of discharge medications.  Relevant Imaging Results:  Relevant Lab Results:   Additional Information SSN: [id number]  Standley Brooking, LCSW

## 2016-01-01 NOTE — Evaluation (Signed)
Physical Therapy Evaluation Patient Details Name: Brian [name] MRN: [id number] DOB: [birth year] Today's Date: 01/01/2016   History of Present Illness  75 year old male with history of BPH, A. fib on Coumadin, Parkinson's disease, hypertension, OSA, hyperactive bladder, bil TKA presented with three-day history of worsening nausea with vomiting and increased confusion and admitted for acute uremic encephalopathy  Clinical Impression  Pt admitted with above diagnosis. Pt currently with functional limitations due to the deficits listed below (see PT Problem List).  Pt will benefit from skilled PT to increase their independence and safety with mobility to allow discharge to the venue listed below.  Pt reports he was using cane prior to admission and able to perform stairs.  Pt states family assists intermittently and spouse works during the day.  Pt would benefit from ST-SNF if family unable to provide 24/7 assist for safety.  Pt with fall just prior to admission.     Follow Up Recommendations Supervision/Assistance - 24 hour;SNF    Equipment Recommendations  None recommended by PT    Recommendations for Other Services       Precautions / Restrictions Precautions Precautions: Fall      Mobility  Bed Mobility Overal bed mobility: Needs Assistance Bed Mobility: Supine to Sit     Supine to sit: Supervision;HOB elevated        Transfers Overall transfer level: Needs assistance Equipment used: Rolling walker (2 wheeled) Transfers: Sit to/from Stand Sit to Stand: Mod assist;From elevated surface         General transfer comment: verbal cues for technique, initially mod assist to rise and steady due to posterior lean, improved to min assist for transfer to [facility] end of session  Ambulation/Gait Ambulation/Gait assistance: Min assist Ambulation Distance (Feet): 120 Feet Assistive device: Rolling walker (2 wheeled) Gait Pattern/deviations: Step-through pattern;Shuffle;Decreased  stride length;Trunk flexed Gait velocity: decr   General Gait Details: verbal cues for technique and RW positioning, shuffling improves briefly with cues for "big steps"   Stairs            Wheelchair Mobility    Modified Rankin (Stroke Patients Only)       Balance Overall balance assessment: History of Falls (fall prior to admission, reports none in awhile prior to this)                                           Pertinent Vitals/Pain Pain Assessment: No/denies pain    Home Living Family/patient expects to be discharged to:: Private residence Living Arrangements: Spouse/significant other;Children Available Help at Discharge: Family;Available PRN/intermittently Type of Home: House       Home Layout: Two level Home Equipment: [place] - 2 wheels;Toilet riser      Prior Function Level of Independence: Independent               Hand Dominance        Extremity/Trunk Assessment               Lower Extremity Assessment: Generalized weakness         Communication   Communication: No difficulties  Cognition Arousal/Alertness: Awake/alert Behavior During Therapy: WFL for tasks assessed/performed Overall Cognitive Status: Within Functional Limits for tasks assessed                      General Comments      Exercises  Assessment/Plan    PT Assessment Patient needs continued PT services  PT Problem List Decreased strength;Decreased balance;Decreased knowledge of use of DME;Decreased mobility;Decreased coordination          PT Treatment Interventions DME instruction;Gait training;Functional mobility training;Stair training;Therapeutic exercise;Therapeutic activities;Balance training;Patient/family education    PT Goals (Current goals can be found in the Care Plan section)  Acute Rehab PT Goals PT Goal Formulation: With patient Time For Goal Achievement: 01/08/16 Potential to Achieve Goals: Good    Frequency  Min 3X/week   Barriers to discharge        Co-evaluation               End of Session Equipment Utilized During Treatment: Gait belt Activity Tolerance: Patient tolerated treatment well Patient left: with nursing/sitter in room;Other (comment) (RN in to give meds, pt requested BSC, left with RN) Nurse Communication: Mobility status         Time: [id number] PT Time Calculation (min) (ACUTE ONLY): 28 min   Charges:   PT Evaluation $PT Eval Moderate Complexity: 1 Procedure PT Treatments $Gait Training: 8-22 mins   PT G Codes:        Tuan Tippin,KATHrine E 01/01/2016, 2:18 PM Carmelia Bake, PT, DPT 01/01/2016 Pager: [phone]

## 2016-01-01 NOTE — Progress Notes (Signed)
Initial Nutrition Assessment  DOCUMENTATION CODES:   Not applicable  INTERVENTION:   - Provide Ensure Enlive oral nutrition supplement BID between meals. Each provides 350 kcal and 20 grams protein. - Encouraged PO intake. - Will continue to monitor for nutrition-related needs.  NUTRITION DIAGNOSIS:   Inadequate oral intake related to nausea, vomiting as evidenced by per patient/family report.  GOAL:   Patient will meet greater than or equal to 90% of their needs  MONITOR:   PO intake, Supplement acceptance, Weight trends, Labs, I & O's, Skin  REASON FOR ASSESSMENT:   Malnutrition Screening Tool   ASSESSMENT:   75 y.o. male with medical history significant of benign prostatic hyperplasia , A. fib, Parkinson's disease, HTN, OSA, hyperlipidemia, bladder hyperactivity. Presented with 3 day history of worsening nausea vomiting and confusion at baseline patient is alert and oriented. He's been having progressive diffuse fatigue. Have been having straining to urinate states "peeing urine balls" 2 weeks. He's been having decreased appetite.  Spoke with pt at bedside who reports poor appetite PTA. Pt states that he is "on my own" for breakfast and lunch as he is retired but his wife still works. Pt states that sometimes he does not have breakfast or lunch. If he does, pt will consume raisin bran crunch cereal for breakfast and a chicken sandwich for lunch. Pt's wife usually makes "baked food" for dinner like baked chicken or pork chops.  Pt reports being hungry for lunch tray. Per pt, he consumed baked chicken, creamed potatoes, and greens. States he ate most of his meal.  Noted pt experienced N/V PTA. Pt states he has been hungry since admission and that lunch meal was well-tolerated. Pt denies any episodes of N/V since admission.  Pt states he has lost weight recently. Pt states weight loss has been "selective" but also that he was not trying to lose weight. Unsure if pt's weight loss  was intentional, but suspect it was not. Per chart, pt's weight has remained mostly stable. Pt reports UBW as 240#.  Pt reports wife buys cases of Boost oral nutrition supplement and that he consumes 1 daily. Discussed Ensure Enlive oral nutrition supplement. Pt amenable to trying vanilla flavor while in the hospital.  Pt denies any issues chewing or swallowing. States he is able to cut his own food but that his wife will occasionally do it for him if they are at a restaurant.  Medications reviewed and include Sinemet 50-200 mg controlled release tablet daily, Sinemet 25-100 mg immediate release 4 times daily, 20 mg Pepcid daily, 40 mg Pravachol daily, PRN Zofran  Labs reviewed and include elevated BUN (44 mg/dL), elevated creatinine (2.45 mg/dL), elevated glucose (107 mg/dL)  NFPE: Exam completed. No fat depletion, mild muscle depletion, and no edema noted.  Diet Order:  Diet Heart Room service appropriate? Yes; Fluid consistency: Thin  Skin:  Reviewed, no issues (per RN, buttocks healed w/ skin peeling)  Last BM:  12/31/15  Height:   Ht Readings from Last 1 Encounters:  12/31/15 6\' 2"  (1.88 m)    Weight:   Wt Readings from Last 1 Encounters:  12/31/15 232 lb 9.6 oz (105.5 kg)    Ideal Body Weight:  86.4 kg  BMI:  Body mass index is 29.86 kg/m.  Estimated Nutritional Needs:   Kcal:  1900-2100 (18-20 kcal/kg)  Protein:  106-127 g (1.0-1.2 g/kg)  Fluid:  1.9-2.1 L/day  EDUCATION NEEDS:   No education needs identified at this time  Jeb Levering Dietetic Intern  Pager Number: [phone]

## 2016-01-01 NOTE — Progress Notes (Signed)
Spoke with pt's wife concerning discharge needs. Will have PT eval for disposition.  Pt may need SNF.

## 2016-01-02 DIAGNOSIS — I10 Essential (primary) hypertension: Secondary | ICD-10-CM

## 2016-01-02 DIAGNOSIS — N179 Acute kidney failure, unspecified: Secondary | ICD-10-CM

## 2016-01-02 DIAGNOSIS — G9340 Encephalopathy, unspecified: Secondary | ICD-10-CM

## 2016-01-02 DIAGNOSIS — Z7901 Long term (current) use of anticoagulants: Secondary | ICD-10-CM

## 2016-01-02 DIAGNOSIS — N182 Chronic kidney disease, stage 2 (mild): Secondary | ICD-10-CM

## 2016-01-02 DIAGNOSIS — I482 Chronic atrial fibrillation: Secondary | ICD-10-CM

## 2016-01-02 LAB — PROTIME-INR
INR: 3.13
PROTHROMBIN TIME: 32.9 s — AB (ref 11.4–15.2)

## 2016-01-02 LAB — BASIC METABOLIC PANEL
ANION GAP: 6 (ref 5–15)
BUN: 36 mg/dL — ABNORMAL HIGH (ref 6–20)
CALCIUM: 9 mg/dL (ref 8.9–10.3)
CO2: 28 mmol/L (ref 22–32)
Chloride: 105 mmol/L (ref 101–111)
Creatinine, Ser: 1.31 mg/dL — ABNORMAL HIGH (ref 0.61–1.24)
GFR, EST NON AFRICAN AMERICAN: 52 mL/min — AB (ref >60)
GLUCOSE: 92 mg/dL (ref 65–99)
Potassium: 3.8 mmol/L (ref 3.5–5.1)
SODIUM: 139 mmol/L (ref 135–145)

## 2016-01-02 LAB — URINE CULTURE: Culture: NO GROWTH

## 2016-01-02 LAB — HEMOGLOBIN A1C
HEMOGLOBIN A1C: 5.5 % (ref 4.8–5.6)
MEAN PLASMA GLUCOSE: 111 mg/dL

## 2016-01-02 LAB — CBC
HCT: 33.3 % — ABNORMAL LOW (ref 39.0–52.0)
HEMOGLOBIN: 11.6 g/dL — AB (ref 13.0–17.0)
MCH: 31.4 pg (ref 26.0–34.0)
MCHC: 34.8 g/dL (ref 30.0–36.0)
MCV: 90.2 fL (ref 78.0–100.0)
Platelets: 145 10*3/uL — ABNORMAL LOW (ref 150–400)
RBC: 3.69 MIL/uL — ABNORMAL LOW (ref 4.22–5.81)
RDW: 13.6 % (ref 11.5–15.5)
WBC: 5.1 10*3/uL (ref 4.0–10.5)

## 2016-01-02 MED ORDER — TAMSULOSIN HCL 0.4 MG PO CAPS
0.4000 mg | ORAL_CAPSULE | Freq: Two times a day (BID) | ORAL | Status: DC
  Administered 2016-01-02 – 2016-01-04 (×5): 0.4 mg via ORAL
  Filled 2016-01-02 (×5): qty 1

## 2016-01-02 NOTE — Clinical Social Work Placement (Signed)
Patient has a bed at [facility]. CSW has completed FL2 & will continue to follow and assist with discharge when ready.    Raynaldo Opitz, [facility] Clinical Social Worker cell #: [phone]     CLINICAL SOCIAL WORK PLACEMENT  NOTE  Date:  01/02/2016  Patient Details  Name: Brian [name] MRN: [id number] Date of Birth: 03/23/1941  Clinical Social Work is seeking post-discharge placement for this patient at the [place] level of care (*CSW will initial, date and re-position this form in  chart as items are completed):  Yes   Patient/family provided with [place] Work Department's list of facilities offering this level of care within the geographic area requested by the patient (or if unable, by the patient's family).  Yes   Patient/family informed of their freedom to choose among providers that offer the needed level of care, that participate in Medicare, Medicaid or managed care program needed by the patient, have an available bed and are willing to accept the patient.  Yes   Patient/family informed of [place]'s ownership interest in [facility] and [facility], as well as of the fact that they are under no obligation to receive care at these facilities.  PASRR submitted to EDS on       PASRR number received on       Existing PASRR number confirmed on 01/02/16     FL2 transmitted to all facilities in geographic area requested by pt/family on 01/02/16     FL2 transmitted to all facilities within larger geographic area on       Patient informed that his/her managed care company has contracts with or will negotiate with certain facilities, including the following:        Yes   Patient/family informed of bed offers received.  Patient chooses bed at [facility]     Physician recommends and patient chooses bed at      Patient to be transferred to [facility] on   .  Patient to be transferred to facility by       Patient family notified on   of transfer.  Name of family member notified:        PHYSICIAN       Additional Comment:    _______________________________________________ Standley Brooking, LCSW 01/02/2016, 11:36 AM

## 2016-01-02 NOTE — Clinical Social Work Note (Signed)
Clinical Social Work Assessment  Patient Details  Name: Brian [name] MRN: [id number] Date of Birth: 1941-02-25  Date of referral:  01/02/16               Reason for consult:  Facility Placement                Permission sought to share information with:  Chartered certified accountant granted to share information::  Yes, Verbal Permission Granted  Name::        Agency::     Relationship::     Contact Information:     Housing/Transportation Living arrangements for the past 2 months:  Single Family Home Source of Information:  Patient, Spouse Patient Interpreter Needed:  None Criminal Activity/Legal Involvement Pertinent to Current Situation/Hospitalization:  No - Comment as needed Significant Relationships:  Spouse Lives with:  Spouse Do you feel safe going back to the place where you live?  No Need for family participation in patient care:  Yes (Comment)  Care giving concerns:  CSW received consult from [place] that patient's wife is requesting SNF for patient at discharge.    Social Worker assessment / plan:  CSW checked PT evaluation recommending 24 hour supervision/SNF. CSW sent information out to [facility].   Employment status:  Retired Forensic scientist:  [employer] PT Recommendations:  [place], [place] / Referral to community resources:  [place]  Patient/Family's Response to care:  Patient states that he had been to [facility] in the past but would be ok with going to [place] confirmed with Narda Rutherford at [facility] that they would be able to take patient when ready - possible discharge tomorrow (Thurs, 01/03/16)  Patient/Family's Understanding of and Emotional Response to Diagnosis, Current Treatment, and Prognosis:    Emotional Assessment Appearance:  Appears stated age Attitude/Demeanor/Rapport:    Affect (typically observed):    Orientation:  Oriented to Self, Oriented to  Place, Oriented to  Time, Oriented to Situation Alcohol / Substance use:    Psych involvement (Current and /or in the community):     Discharge Needs  Concerns to be addressed:    Readmission within the last 30 days:    Current discharge risk:    Barriers to Discharge:      Standley Brooking, LCSW 01/02/2016, 11:35 AM

## 2016-01-02 NOTE — Progress Notes (Addendum)
Pt was irrigated with 30 cc NS this am. No clots, bloody urine drained without complication. FC to gravity, tubing patent. 325 cc pulled off. Patient tolerated well. Pt now reports feeling some better. Will continue to monitor

## 2016-01-02 NOTE — Progress Notes (Addendum)
[facility] PROGRESS NOTE    Progress Note  Brian [name]  [id number] DOB: Mar 15, 1941 DOA: 12/31/2015 PCP: Wenda Low, MD     Brief Narrative:   Brian [name] is an 75 y.o. male past medical history of atrial fibrillation, Parkinson's disease, atrial fibrillation on Coumadin, essential hypertension presents with a three-day history of nausea vomiting and increased confusion was found to have acute obstructive uropathy, Foley was placed that showed hematuria, urology was curbside recommended the patient is discharged with a Foley and follow-up as an outpatient in 2-4 weeks.  Assessment/Plan:   Acute encephalopathy: Likely due to acute bladder outlet obstruction question due to BPH, his Foley was clotted this morning and had to be flush. Encephalopathy that resolved, the ultrasound did not show any hydronephrosis. He was started on Flomax.  Acute kidney injury: Likely due to acute obstructive uropathy resolved with Foley placement.  Hematuria: Probably from trauma in the setting of therapeutic INR Coumadin was held and admission. We'll repeat INR continue hydration orally and follow along if no improvement will consider urology. Check a CBC in the morning.He continues to have significant amount of hematuria.  Essential hypertension Stable continue current regimen appear  Chronic Atrial fibrillation: Check an INR as it was supratherapeutic on admission and the patient with ongoing hematuria. I talked to him about the risk and benefits of holding his Coumadin he agrees.  Generalized weakness: CT scan of the head negative for any acute changes. PT evaluation recommended skilled nursing facility.  Parkinson's disease:  Excellent continue Sinemet.  DVT prophylaxis: coumadin Family Communication:none Disposition Plan/Barrier to D/C: home in 1-2 days  Code Status:     Code Status Orders        Start     Ordered   12/31/15 2322  Full code  Continuous     12/31/15 2322    Code Status History    Date Active Date Inactive Code Status Order ID Comments User Context   11/30/2014  1:12 PM 12/03/2014  6:34 PM Full Code [id number]  Susa Day, MD Inpatient    Advance Directive Documentation   Flowsheet Row Most Recent Value  Type of Advance Directive  Healthcare Power of Attorney  Pre-existing out of facility DNR order (yellow form or pink MOST form)  No data  "MOST" Form in Place?  No data        IV Access:    Peripheral IV   Procedures and diagnostic studies:   Ct Head Wo Contrast  Result Date: 12/31/2015 CLINICAL DATA:  Confusion, weakness for 2 days. Recent fall striking back of head. EXAM: CT HEAD WITHOUT CONTRAST TECHNIQUE: Contiguous axial images were obtained from the base of the skull through the vertex without intravenous contrast. COMPARISON:  Brain MRI 10/07/2011 FINDINGS: Brain: No evidence of acute infarction, hemorrhage, hydrocephalus, extra-axial collection or mass lesion/mass effect. Generalized volume loss, with mild progression from July 2013 MRI. Moderate chronic small vessel ischemia. Vascular: No hyperdense vessel or unexpected calcification. Atherosclerosis of skullbase vasculature. Skull: Normal. Negative for fracture or focal lesion. Sinuses/Orbits: No acute finding. Other: None. IMPRESSION: 1.  No acute intracranial abnormality. 2. Mild increase in generalized atrophy from 2013. Chronic small vessel ischemia is stable. Electronically Signed   By: Jeb Levering M.D.   On: 12/31/2015 22:46   US Renal  Result Date: 01/01/2016 CLINICAL DATA:  Inpatient with acute renal failure. EXAM: RENAL / URINARY TRACT ULTRASOUND COMPLETE COMPARISON:  04/26/2012 CT abdomen/pelvis. FINDINGS: Right Kidney: Length: 12.4 cm. Echogenicity within normal  limits. No mass or hydronephrosis visualized. Left Kidney: Length: 11.9 cm. Echogenicity within normal limits. No mass or hydronephrosis visualized. Bladder: Bladder is incompletely  collapsed by indwelling Foley catheter. No definite bladder wall thickening. IMPRESSION: 1. No hydronephrosis.  Unremarkable kidneys. 2. Bladder is incompletely collapsed by indwelling Foley catheter. No definite bladder wall thickening. Electronically Signed   By: Ilona Sorrel M.D.   On: 01/01/2016 12:26     Medical Consultants:    None.  Anti-Infectives:   None  Subjective:    Brian [name] he relates no new complaints, feels great. Will like to get more out of bed.  Objective:    Vitals:   01/01/16 1425 01/01/16 2044 01/02/16 0504 01/02/16 1315  BP: 112/87 119/69 132/89 128/81  Pulse: 78 78 76 73  Resp: 19 18 19 20   Temp: 99.3 F (37.4 C) 98.2 F (36.8 C) 98.6 F (37 C) 98.7 F (37.1 C)  TempSrc: Oral Oral Oral Oral  SpO2: 97% 96% 98% 97%  Weight:      Height:        Intake/Output Summary (Last 24 hours) at 01/02/16 1331 Last data filed at 01/02/16 1030  Gross per 24 hour  Intake              480 ml  Output             2225 ml  Net            -1745 ml   Filed Weights   12/31/15 1630 12/31/15 2309  Weight: 108.9 kg (240 lb) 105.5 kg (232 lb 9.6 oz)    Exam: General exam: In no acute distress. Respiratory system: Good air movement and clear to auscultation. Cardiovascular system: S1 & S2 heard, RRR. No JVD. Gastrointestinal system: Abdomen is nondistended, soft and nontender.  Central nervous system: Alert and oriented. No focal neurological deficits.No asterixis Extremities: No pedal edema. Skin: No rashes, lesions or ulcers Psychiatry: Judgement and insight appear normal. Mood & affect appropriate.    Data Reviewed:    Labs: Basic Metabolic Panel:  Recent Labs Lab 12/31/15 1733 01/01/16 0404 01/02/16 0338  NA 133* 138 139  K 4.5 3.9 3.8  CL 96* 104 105  CO2 26 27 28   GLUCOSE 119* 107* 92  BUN 51* 44* 36*  CREATININE 4.14* 2.45* 1.31*  CALCIUM 9.8 9.2 9.0  MG  --  1.9  --   PHOS  --  3.3  --    GFR Estimated Creatinine Clearance: 65  mL/min (by C-G formula based on SCr of 1.31 mg/dL (H)). Liver Function Tests:  Recent Labs Lab 12/31/15 1733 01/01/16 0404  AST 25 21  ALT 6* <5*  ALKPHOS 46 38  BILITOT 1.9* 1.3*  PROT 7.4 6.1*  ALBUMIN 4.6 3.5   No results for input(s): LIPASE, AMYLASE in the last 168 hours. No results for input(s): AMMONIA in the last 168 hours. Coagulation profile  Recent Labs Lab 12/31/15 1940 01/02/16 1110  INR 3.30 3.13    CBC:  Recent Labs Lab 12/31/15 1733 01/01/16 0404 01/02/16 0338  WBC 9.3 7.0 5.1  NEUTROABS 7.7  --   --   HGB 13.4 11.9* 11.6*  HCT 39.2 34.8* 33.3*  MCV 92.0 91.8 90.2  PLT 159 142* 145*   Cardiac Enzymes:  Recent Labs Lab 12/31/15 2229 01/01/16 0404 01/01/16 0932 01/01/16 1603  CKTOTAL 225  --   --   --   TROPONINI <0.03 <0.03 <0.03 <0.03   BNP (  last 3 results) No results for input(s): PROBNP in the last 8760 hours. CBG: No results for input(s): GLUCAP in the last 168 hours. D-Dimer: No results for input(s): DDIMER in the last 72 hours. Hgb A1c:  Recent Labs  01/01/16 0404  HGBA1C 5.5   Lipid Profile: No results for input(s): CHOL, HDL, LDLCALC, TRIG, CHOLHDL, LDLDIRECT in the last 72 hours. Thyroid function studies:  Recent Labs  01/01/16 0404  TSH 0.713   Anemia work up: No results for input(s): VITAMINB12, FOLATE, FERRITIN, TIBC, IRON, RETICCTPCT in the last 72 hours. Sepsis Labs:  Recent Labs Lab 12/31/15 1733 12/31/15 2229 01/01/16 0404 01/02/16 0338  WBC 9.3  --  7.0 5.1  LATICACIDVEN  --  1.0  --   --    Microbiology Recent Results (from the past 240 hour(s))  Urine culture     Status: None   Collection Time: 12/31/15  7:00 PM  Result Value Ref Range Status   Specimen Description URINE, CATHETERIZED  Final   Special Requests NONE  Final   Culture NO GROWTH Performed at [facility]   Final   Report Status 01/02/2016 FINAL  Final     Medications:   . carbidopa-levodopa  1 tablet Oral QHS  .  carbidopa-levodopa  2 tablet Oral QID  . chlorhexidine  15 mL Mouth Rinse BID  . doxazosin  2 mg Oral QHS  . escitalopram  5 mg Oral QHS  . famotidine  20 mg Oral Daily  . feeding supplement (ENSURE ENLIVE)  237 mL Oral BID BM  . mouth rinse  15 mL Mouth Rinse q12n4p  . nebivolol  10 mg Oral QPM  . pravastatin  40 mg Oral Daily  . sodium chloride flush  3 mL Intravenous Q12H  . tamsulosin  0.4 mg Oral BID   Continuous Infusions:    LOS: 2 days   Charlynne Cousins  [facility] Pager [phone]  *Please refer to [email or web address], password [email or web address] to get updated schedule on who will round on this patient, as hospitalists switch teams weekly. If 7PM-7AM, please contact night-coverage at [email or web address], password [email or web address] for any overnight needs.  01/02/2016, 1:31 PM

## 2016-01-03 LAB — BASIC METABOLIC PANEL
ANION GAP: 6 (ref 5–15)
BUN: 24 mg/dL — ABNORMAL HIGH (ref 6–20)
CHLORIDE: 104 mmol/L (ref 101–111)
CO2: 28 mmol/L (ref 22–32)
Calcium: 9 mg/dL (ref 8.9–10.3)
Creatinine, Ser: 1.07 mg/dL (ref 0.61–1.24)
Glucose, Bld: 99 mg/dL (ref 65–99)
POTASSIUM: 3.7 mmol/L (ref 3.5–5.1)
SODIUM: 138 mmol/L (ref 135–145)

## 2016-01-03 LAB — CBC
HCT: 34 % — ABNORMAL LOW (ref 39.0–52.0)
HEMOGLOBIN: 11.4 g/dL — AB (ref 13.0–17.0)
MCH: 31.1 pg (ref 26.0–34.0)
MCHC: 33.5 g/dL (ref 30.0–36.0)
MCV: 92.6 fL (ref 78.0–100.0)
PLATELETS: 164 10*3/uL (ref 150–400)
RBC: 3.67 MIL/uL — AB (ref 4.22–5.81)
RDW: 13.6 % (ref 11.5–15.5)
WBC: 4 10*3/uL (ref 4.0–10.5)

## 2016-01-03 MED ORDER — POLYETHYLENE GLYCOL 3350 17 G PO PACK
17.0000 g | PACK | Freq: Two times a day (BID) | ORAL | Status: DC
  Administered 2016-01-03: 17 g via ORAL
  Filled 2016-01-03 (×3): qty 1

## 2016-01-03 MED ORDER — PHYTONADIONE 1 MG/0.5 ML ORAL SOLUTION
0.5000 mg | Freq: Once | ORAL | Status: AC
  Administered 2016-01-03: 0.5 mg via ORAL
  Filled 2016-01-03: qty 0.5

## 2016-01-03 NOTE — Progress Notes (Addendum)
Patient irrigated with 60 cc NS. No clots, bloody urine drained. FC to gravity, tubing patent. Patient denies discomfort or pain. 500 cc emptied from foley after irrigation. Patient tolerated well. Will continue to monitor.

## 2016-01-03 NOTE — Progress Notes (Signed)
[facility] PROGRESS NOTE    Progress Note  Brian [name]  [id number] DOB: June 20, 1940 DOA: 12/31/2015 PCP: Wenda Low, MD     Brief Narrative:   Brian [name] is an 75 y.o. male past medical history of atrial fibrillation, Parkinson's disease, atrial fibrillation on Coumadin, essential hypertension presents with a three-day history of nausea vomiting and increased confusion was found to have acute obstructive uropathy, Foley was placed that showed hematuria, urology was curbside recommended the patient is discharged with a Foley and follow-up as an outpatient in 2-4 weeks.  Assessment/Plan:   Acute encephalopathy: Likely due to acute bladder outlet obstruction question due to BPH. He was started on Flomax. Follow-up with urology as an outpatient.  Acute kidney injury: Likely due to acute obstructive uropathy resolved with Foley placement.  Hematuria: INR continues to be supratherapeutic he was given a dose of vitamin K today will repeat INR in the morning. His urine seems to be clearing.  Essential hypertension Stable continue current regimen appear  Chronic Atrial fibrillation: Check an INR as it was supratherapeutic, will give a small dose of vitamin K I talked to him about the risk and benefits of holding his Coumadin he agrees.  Generalized weakness: CT scan of the head negative for any acute changes. PT evaluation recommended skilled nursing facility.  Parkinson's disease:  Continue Sinemet.  DVT prophylaxis: coumadin Family Communication:none Disposition Plan/Barrier to D/C: home in 1 days  Code Status:     Code Status Orders        Start     Ordered   12/31/15 2322  Full code  Continuous     12/31/15 2322    Code Status History    Date Active Date Inactive Code Status Order ID Comments User Context   11/30/2014  1:12 PM 12/03/2014  6:34 PM Full Code [id number]  Susa Day, MD Inpatient    Advance Directive Documentation   Flowsheet Row  Most Recent Value  Type of Advance Directive  Healthcare Power of Attorney  Pre-existing out of facility DNR order (yellow form or pink MOST form)  No data  "MOST" Form in Place?  No data        IV Access:    Peripheral IV   Procedures and diagnostic studies:   US Renal  Result Date: 01/01/2016 CLINICAL DATA:  Inpatient with acute renal failure. EXAM: RENAL / URINARY TRACT ULTRASOUND COMPLETE COMPARISON:  04/26/2012 CT abdomen/pelvis. FINDINGS: Right Kidney: Length: 12.4 cm. Echogenicity within normal limits. No mass or hydronephrosis visualized. Left Kidney: Length: 11.9 cm. Echogenicity within normal limits. No mass or hydronephrosis visualized. Bladder: Bladder is incompletely collapsed by indwelling Foley catheter. No definite bladder wall thickening. IMPRESSION: 1. No hydronephrosis.  Unremarkable kidneys. 2. Bladder is incompletely collapsed by indwelling Foley catheter. No definite bladder wall thickening. Electronically Signed   By: Ilona Sorrel M.D.   On: 01/01/2016 12:26     Medical Consultants:    None.  Anti-Infectives:   None  Subjective:    Brian [name] he relates no new complains.  Objective:    Vitals:   01/02/16 0504 01/02/16 1315 01/02/16 2020 01/03/16 0625  BP: 132/89 128/81 (!) 160/80 (!) 157/88  Pulse: 76 73 65 76  Resp: 19 20 18 18   Temp: 98.6 F (37 C) 98.7 F (37.1 C) 98.4 F (36.9 C) 98.7 F (37.1 C)  TempSrc: Oral Oral Oral Oral  SpO2: 98% 97% 99% 98%  Weight:      Height:  Intake/Output Summary (Last 24 hours) at 01/03/16 1015 Last data filed at 01/03/16 0700  Gross per 24 hour  Intake              300 ml  Output             1825 ml  Net            -1525 ml   Filed Weights   12/31/15 1630 12/31/15 2309  Weight: 108.9 kg (240 lb) 105.5 kg (232 lb 9.6 oz)    Exam: General exam: In no acute distress. Respiratory system: Good air movement and clear to auscultation. Cardiovascular system: S1 & S2 heard, RRR. No  JVD. Gastrointestinal system: Abdomen is nondistended, soft and nontender.  Central nervous system: Alert and oriented. No focal neurological deficits.No asterixis Extremities: No pedal edema. Skin: No rashes, lesions or ulcers Psychiatry: Judgement and insight appear normal. Mood & affect appropriate.    Data Reviewed:    Labs: Basic Metabolic Panel:  Recent Labs Lab 12/31/15 1733 01/01/16 0404 01/02/16 0338 01/03/16 0343  NA 133* 138 139 138  K 4.5 3.9 3.8 3.7  CL 96* 104 105 104  CO2 26 27 28 28   GLUCOSE 119* 107* 92 99  BUN 51* 44* 36* 24*  CREATININE 4.14* 2.45* 1.31* 1.07  CALCIUM 9.8 9.2 9.0 9.0  MG  --  1.9  --   --   PHOS  --  3.3  --   --    GFR Estimated Creatinine Clearance: 79.6 mL/min (by C-G formula based on SCr of 1.07 mg/dL). Liver Function Tests:  Recent Labs Lab 12/31/15 1733 01/01/16 0404  AST 25 21  ALT 6* <5*  ALKPHOS 46 38  BILITOT 1.9* 1.3*  PROT 7.4 6.1*  ALBUMIN 4.6 3.5   No results for input(s): LIPASE, AMYLASE in the last 168 hours. No results for input(s): AMMONIA in the last 168 hours. Coagulation profile  Recent Labs Lab 12/31/15 1940 01/02/16 1110  INR 3.30 3.13    CBC:  Recent Labs Lab 12/31/15 1733 01/01/16 0404 01/02/16 0338 01/03/16 0343  WBC 9.3 7.0 5.1 4.0  NEUTROABS 7.7  --   --   --   HGB 13.4 11.9* 11.6* 11.4*  HCT 39.2 34.8* 33.3* 34.0*  MCV 92.0 91.8 90.2 92.6  PLT 159 142* 145* 164   Cardiac Enzymes:  Recent Labs Lab 12/31/15 2229 01/01/16 0404 01/01/16 0932 01/01/16 1603  CKTOTAL 225  --   --   --   TROPONINI <0.03 <0.03 <0.03 <0.03   BNP (last 3 results) No results for input(s): PROBNP in the last 8760 hours. CBG: No results for input(s): GLUCAP in the last 168 hours. D-Dimer: No results for input(s): DDIMER in the last 72 hours. Hgb A1c:  Recent Labs  01/01/16 0404  HGBA1C 5.5   Lipid Profile: No results for input(s): CHOL, HDL, LDLCALC, TRIG, CHOLHDL, LDLDIRECT in the last  72 hours. Thyroid function studies:  Recent Labs  01/01/16 0404  TSH 0.713   Anemia work up: No results for input(s): VITAMINB12, FOLATE, FERRITIN, TIBC, IRON, RETICCTPCT in the last 72 hours. Sepsis Labs:  Recent Labs Lab 12/31/15 1733 12/31/15 2229 01/01/16 0404 01/02/16 0338 01/03/16 0343  WBC 9.3  --  7.0 5.1 4.0  LATICACIDVEN  --  1.0  --   --   --    Microbiology Recent Results (from the past 240 hour(s))  Urine culture     Status: None   Collection Time: 12/31/15  7:00 PM  Result Value Ref Range Status   Specimen Description URINE, CATHETERIZED  Final   Special Requests NONE  Final   Culture NO GROWTH Performed at [facility]   Final   Report Status 01/02/2016 FINAL  Final     Medications:   . carbidopa-levodopa  1 tablet Oral QHS  . carbidopa-levodopa  2 tablet Oral QID  . chlorhexidine  15 mL Mouth Rinse BID  . doxazosin  2 mg Oral QHS  . escitalopram  5 mg Oral QHS  . famotidine  20 mg Oral Daily  . feeding supplement (ENSURE ENLIVE)  237 mL Oral BID BM  . mouth rinse  15 mL Mouth Rinse q12n4p  . nebivolol  10 mg Oral QPM  . phytonadione  0.5 mg Oral Once  . polyethylene glycol  17 g Oral BID  . pravastatin  40 mg Oral Daily  . sodium chloride flush  3 mL Intravenous Q12H  . tamsulosin  0.4 mg Oral BID   Continuous Infusions:    LOS: 3 days   Brian [name]  [facility] Pager [phone]  *Please refer to [email or web address], password [email or web address] to get updated schedule on who will round on this patient, as hospitalists switch teams weekly. If 7PM-7AM, please contact night-coverage at [email or web address], password [email or web address] for any overnight needs.  01/03/2016, 10:15 AM

## 2016-01-03 NOTE — Progress Notes (Signed)
Physical Therapy Treatment Patient Details Name: Brian [name] MRN: [id number] DOB: 04-Jan-1941 Today's Date: 01-13-2016    History of Present Illness 75 year old male with history of BPH, A. fib on Coumadin, Parkinson's disease, hypertension, OSA, hyperactive bladder, bil TKA presented with three-day history of worsening nausea with vomiting and increased confusion and admitted for acute uremic encephalopathy    PT Comments    Pt assisted OOB and ambulated in hallway.  Pt plans to d/c to SNF for rehab prior to return home.  Follow Up Recommendations  Supervision/Assistance - 24 hour;SNF     Equipment Recommendations  None recommended by PT    Recommendations for Other Services       Precautions / Restrictions Precautions Precautions: Fall    Mobility  Bed Mobility Overal bed mobility: Needs Assistance Bed Mobility: Supine to Sit     Supine to sit: HOB elevated;Mod assist     General bed mobility comments: pt required more assist today for scooting to EOB, utilized bed pad  Transfers Overall transfer level: Needs assistance Equipment used: Rolling walker (2 wheeled) Transfers: Sit to/from Stand Sit to Stand: Mod assist;From elevated surface         General transfer comment: verbal cues for technique, assist to rise and steady due to posterior lean  Ambulation/Gait Ambulation/Gait assistance: Min assist Ambulation Distance (Feet): 100 Feet Assistive device: Rolling walker (2 wheeled) Gait Pattern/deviations: Step-through pattern;Decreased stride length;Shuffle;Trunk flexed Gait velocity: decr   General Gait Details: verbal cues for technique and RW positioning, shuffling improves briefly with cues for "big steps"    Stairs            Wheelchair Mobility    Modified Rankin (Stroke Patients Only)       Balance                                    Cognition Arousal/Alertness: Awake/alert Behavior During Therapy: WFL for tasks  assessed/performed Overall Cognitive Status: Within Functional Limits for tasks assessed                      Exercises      General Comments        Pertinent Vitals/Pain Pain Assessment: No/denies pain    Home Living                      Prior Function            PT Goals (current goals can now be found in the care plan section) Progress towards PT goals: Progressing toward goals    Frequency    Min 3X/week      PT Plan Current plan remains appropriate    Co-evaluation             End of Session Equipment Utilized During Treatment: Gait belt Activity Tolerance: Patient tolerated treatment well Patient left: in chair;with call bell/phone within reach;with chair alarm set;with nursing/sitter in room     Time: 1041-1101 PT Time Calculation (min) (ACUTE ONLY): 20 min  Charges:  $Gait Training: 8-22 mins                    G Codes:      Burley Kopka,KATHrine E 13-Jan-2016, 1:04 PM Carmelia Bake, PT, DPT 2016-01-13 Pager: [phone]

## 2016-01-04 DIAGNOSIS — N400 Enlarged prostate without lower urinary tract symptoms: Secondary | ICD-10-CM | POA: Diagnosis not present

## 2016-01-04 DIAGNOSIS — I480 Paroxysmal atrial fibrillation: Secondary | ICD-10-CM | POA: Diagnosis not present

## 2016-01-04 DIAGNOSIS — I4891 Unspecified atrial fibrillation: Secondary | ICD-10-CM | POA: Diagnosis not present

## 2016-01-04 DIAGNOSIS — R2689 Other abnormalities of gait and mobility: Secondary | ICD-10-CM | POA: Diagnosis not present

## 2016-01-04 DIAGNOSIS — N183 Chronic kidney disease, stage 3 (moderate): Secondary | ICD-10-CM | POA: Diagnosis not present

## 2016-01-04 DIAGNOSIS — I482 Chronic atrial fibrillation: Secondary | ICD-10-CM | POA: Diagnosis not present

## 2016-01-04 DIAGNOSIS — G20 Parkinson's disease: Secondary | ICD-10-CM

## 2016-01-04 DIAGNOSIS — N401 Enlarged prostate with lower urinary tract symptoms: Secondary | ICD-10-CM | POA: Diagnosis not present

## 2016-01-04 DIAGNOSIS — I10 Essential (primary) hypertension: Secondary | ICD-10-CM | POA: Diagnosis not present

## 2016-01-04 DIAGNOSIS — N138 Other obstructive and reflux uropathy: Secondary | ICD-10-CM | POA: Diagnosis not present

## 2016-01-04 DIAGNOSIS — R339 Retention of urine, unspecified: Secondary | ICD-10-CM | POA: Diagnosis not present

## 2016-01-04 DIAGNOSIS — W19XXXD Unspecified fall, subsequent encounter: Secondary | ICD-10-CM | POA: Diagnosis not present

## 2016-01-04 DIAGNOSIS — R350 Frequency of micturition: Secondary | ICD-10-CM | POA: Diagnosis not present

## 2016-01-04 DIAGNOSIS — N179 Acute kidney failure, unspecified: Secondary | ICD-10-CM | POA: Diagnosis not present

## 2016-01-04 DIAGNOSIS — R41841 Cognitive communication deficit: Secondary | ICD-10-CM | POA: Diagnosis not present

## 2016-01-04 DIAGNOSIS — N182 Chronic kidney disease, stage 2 (mild): Secondary | ICD-10-CM | POA: Diagnosis not present

## 2016-01-04 DIAGNOSIS — M6281 Muscle weakness (generalized): Secondary | ICD-10-CM | POA: Diagnosis not present

## 2016-01-04 DIAGNOSIS — R319 Hematuria, unspecified: Secondary | ICD-10-CM | POA: Diagnosis not present

## 2016-01-04 DIAGNOSIS — Z7901 Long term (current) use of anticoagulants: Secondary | ICD-10-CM | POA: Diagnosis not present

## 2016-01-04 DIAGNOSIS — Z5181 Encounter for therapeutic drug level monitoring: Secondary | ICD-10-CM | POA: Diagnosis not present

## 2016-01-04 DIAGNOSIS — G9340 Encephalopathy, unspecified: Secondary | ICD-10-CM | POA: Diagnosis not present

## 2016-01-04 DIAGNOSIS — R338 Other retention of urine: Secondary | ICD-10-CM | POA: Diagnosis not present

## 2016-01-04 LAB — PROTIME-INR
INR: 1.56
PROTHROMBIN TIME: 18.9 s — AB (ref 11.4–15.2)

## 2016-01-04 MED ORDER — TAMSULOSIN HCL 0.4 MG PO CAPS: 0.4000 mg | ORAL_CAPSULE | Freq: Two times a day (BID) | ORAL | 0 refills | Status: DC

## 2016-01-04 NOTE — Discharge Summary (Signed)
Physician Discharge Summary  Brian [name] [id number] DOB: [birth year] DOA: 12/31/2015  PCP: Wenda Low, MD  Admit date: 12/31/2015 Discharge date: 01/04/2016  Admitted From: home Disposition:  SNF  Recommendations for Outpatient Follow-up:  1. Follow up with PCP in 1-2 weeks, Check INR in 3 days. 2. Please obtain BMP/CBC in one week 3. Please follow up on the following pending results:  Home Health:no Equipment/Devices:none  Discharge Condition:stable CODE STATUS:full Diet recommendation: Heart Healthy  Brief/Interim Summary: 75 year old male with past medical history of benign prostatic hypertrophy, chronic atrial fibrillation and Parkinson's disease who came into the hospital with 3 days of nausea vomiting and confusion, he is alert and able to carry on a conversation as per wife. The left relates he has been progressively fatigued, only urinating small amounts seems he was started on oxybutynin.  Discharge Diagnoses:  Principal Problem:   Acute encephalopathy Active Problems:   Essential hypertension   Atrial fibrillation (HCC)   Parkinsonism (HCC)   CKD (chronic kidney disease) stage 2, GFR 60-89 ml/min   Chronic anticoagulation   Urinary retention   Acute renal failure (ARF) (HCC)  Acute encephalopathy due to acute bladder obstruction possibly due to BPH: Foley was placed once is renal function improved his encephalopathy resolved.  Acute bladder obstruction due to BPH: A Foley was placed it was discussed with urology and they will let us follow-up with patient in 2 weeks. He was started on Flomax twice a day which she will continue as an outpatient.  Acute kidney injury: Likely due to bladder obstruction resolved with Foley placement.  Hematuria: On admission INRs was super therapeutic and he continues to have worsening hematuria. He was given a single dose of vitamin K his INR improved and his hematuria resolved. Likely traumatic.  Essential  hypertension: No changes were made to his regimen  Chronic atrial fibrillation: INR was held on admission he received a small dose of vitamin K. His Coumadin will be resumed as an outpatient. We'll check an INR to 3 days.  Generalized weakness: CT scan of the head showed no acute changes: This improve somewhat after acute renal failure improved. Physical therapy evaluated the patient recommends skilled nursing facility.  Parkinson's disease: No changes were made to his medication.  Discharge Instructions  Discharge Instructions    Diet - low sodium heart healthy    Complete by:  As directed    Increase activity slowly    Complete by:  As directed        Medication List    TAKE these medications   carbidopa-levodopa 25-100 MG tablet Commonly known as:  SINEMET IR Take 2 tablets by mouth 4 (four) times daily.   carbidopa-levodopa 50-200 MG tablet Commonly known as:  SINEMET CR Take 1 tablet by mouth at bedtime.   cholecalciferol 1000 units tablet Commonly known as:  VITAMIN D Take 1,000 Units by mouth daily.   clonazePAM 0.5 MG disintegrating tablet Commonly known as:  KLONOPIN Take 0.5 mg by mouth at bedtime.   diphenhydramine-acetaminophen 25-500 MG Tabs tablet Commonly known as:  TYLENOL PM Take 1-2 tablets by mouth at bedtime as needed (for sleep/pain).   doxazosin 2 MG tablet Commonly known as:  CARDURA Take 2 mg by mouth at bedtime.   escitalopram 5 MG tablet Commonly known as:  LEXAPRO Take 5 mg by mouth at bedtime.   famotidine 40 MG tablet Commonly known as:  PEPCID Take 40 mg by mouth daily.   furosemide 20 MG tablet Commonly known as:  LASIX Take 20 mg by mouth daily.   gabapentin 100 MG capsule Commonly known as:  NEURONTIN Take 1 capsule (100 mg total) by mouth 3 (three) times daily.   multivitamin with minerals Tabs tablet Take 1 tablet by mouth daily.   naproxen sodium 220 MG tablet Commonly known as:  ANAPROX Take 440 mg by mouth at  bedtime as needed (for pain).   nebivolol 10 MG tablet Commonly known as:  BYSTOLIC Take 10 mg by mouth every evening.   potassium chloride 10 MEQ tablet Commonly known as:  K-DUR,KLOR-CON Take 10 mEq by mouth daily.   pravastatin 40 MG tablet Commonly known as:  PRAVACHOL Take 40 mg by mouth daily.   psyllium 95 % Pack Commonly known as:  HYDROCIL/METAMUCIL Take 1 packet by mouth daily.   tamsulosin 0.4 MG Caps capsule Commonly known as:  FLOMAX Take 1 capsule (0.4 mg total) by mouth 2 (two) times daily.   valsartan 160 MG tablet Commonly known as:  DIOVAN Take 160 mg by mouth daily.   warfarin 5 MG tablet Commonly known as:  COUMADIN Take 5 mg by mouth every evening. Pt DOES NOT take this dose on Wednesday.   warfarin 7.5 MG tablet Commonly known as:  COUMADIN Take 7.5 mg by mouth every evening.       Allergies  Allergen Reactions  . Lisinopril Cough    Consultations:  None   Procedures/Studies: Ct Head Wo Contrast  Result Date: 12/31/2015 CLINICAL DATA:  Confusion, weakness for 2 days. Recent fall striking back of head. EXAM: CT HEAD WITHOUT CONTRAST TECHNIQUE: Contiguous axial images were obtained from the base of the skull through the vertex without intravenous contrast. COMPARISON:  Brain MRI 10/07/2011 FINDINGS: Brain: No evidence of acute infarction, hemorrhage, hydrocephalus, extra-axial collection or mass lesion/mass effect. Generalized volume loss, with mild progression from July 2013 MRI. Moderate chronic small vessel ischemia. Vascular: No hyperdense vessel or unexpected calcification. Atherosclerosis of skullbase vasculature. Skull: Normal. Negative for fracture or focal lesion. Sinuses/Orbits: No acute finding. Other: None. IMPRESSION: 1.  No acute intracranial abnormality. 2. Mild increase in generalized atrophy from 2013. Chronic small vessel ischemia is stable. Electronically Signed   By: Jeb Levering M.D.   On: 12/31/2015 22:46   US  Renal  Result Date: 01/01/2016 CLINICAL DATA:  Inpatient with acute renal failure. EXAM: RENAL / URINARY TRACT ULTRASOUND COMPLETE COMPARISON:  04/26/2012 CT abdomen/pelvis. FINDINGS: Right Kidney: Length: 12.4 cm. Echogenicity within normal limits. No mass or hydronephrosis visualized. Left Kidney: Length: 11.9 cm. Echogenicity within normal limits. No mass or hydronephrosis visualized. Bladder: Bladder is incompletely collapsed by indwelling Foley catheter. No definite bladder wall thickening. IMPRESSION: 1. No hydronephrosis.  Unremarkable kidneys. 2. Bladder is incompletely collapsed by indwelling Foley catheter. No definite bladder wall thickening. Electronically Signed   By: Ilona Sorrel M.D.   On: 01/01/2016 12:26      Subjective: No complains feels great  Discharge Exam: Vitals:   01/03/16 2100 01/04/16 0458  BP: (!) 148/82 (!) 144/94  Pulse: 60 75  Resp: 18 20  Temp: 98.4 F (36.9 C) 98.7 F (37.1 C)   Vitals:   01/03/16 0625 01/03/16 1421 01/03/16 2100 01/04/16 0458  BP: (!) 157/88 107/66 (!) 148/82 (!) 144/94  Pulse: 76 72 60 75  Resp: 18 18 18 20   Temp: 98.7 F (37.1 C) 98.5 F (36.9 C) 98.4 F (36.9 C) 98.7 F (37.1 C)  TempSrc: Oral Oral Oral Oral  SpO2: 98% 98% 99% 98%  Weight:      Height:        General: Pt is alert, awake, not in acute distress, tremor at baseline Cardiovascular: RRR, S1/S2 +, no rubs, no gallops Respiratory: CTA bilaterally, no wheezing, no rhonchi Abdominal: Soft, NT, ND, bowel sounds + Extremities: no edema, no cyanosis    The results of significant diagnostics from this hospitalization (including imaging, microbiology, ancillary and laboratory) are listed below for reference.     Microbiology: Recent Results (from the past 240 hour(s))  Urine culture     Status: None   Collection Time: 12/31/15  7:00 PM  Result Value Ref Range Status   Specimen Description URINE, CATHETERIZED  Final   Special Requests NONE  Final   Culture  NO GROWTH Performed at [facility]   Final   Report Status 01/02/2016 FINAL  Final     Labs: BNP (last 3 results) No results for input(s): BNP in the last 8760 hours. Basic Metabolic Panel:  Recent Labs Lab 12/31/15 1733 01/01/16 0404 01/02/16 0338 01/03/16 0343  NA 133* 138 139 138  K 4.5 3.9 3.8 3.7  CL 96* 104 105 104  CO2 26 27 28 28   GLUCOSE 119* 107* 92 99  BUN 51* 44* 36* 24*  CREATININE 4.14* 2.45* 1.31* 1.07  CALCIUM 9.8 9.2 9.0 9.0  MG  --  1.9  --   --   PHOS  --  3.3  --   --    Liver Function Tests:  Recent Labs Lab 12/31/15 1733 01/01/16 0404  AST 25 21  ALT 6* <5*  ALKPHOS 46 38  BILITOT 1.9* 1.3*  PROT 7.4 6.1*  ALBUMIN 4.6 3.5   No results for input(s): LIPASE, AMYLASE in the last 168 hours. No results for input(s): AMMONIA in the last 168 hours. CBC:  Recent Labs Lab 12/31/15 1733 01/01/16 0404 01/02/16 0338 01/03/16 0343  WBC 9.3 7.0 5.1 4.0  NEUTROABS 7.7  --   --   --   HGB 13.4 11.9* 11.6* 11.4*  HCT 39.2 34.8* 33.3* 34.0*  MCV 92.0 91.8 90.2 92.6  PLT 159 142* 145* 164   Cardiac Enzymes:  Recent Labs Lab 12/31/15 2229 01/01/16 0404 01/01/16 0932 01/01/16 1603  CKTOTAL 225  --   --   --   TROPONINI <0.03 <0.03 <0.03 <0.03   BNP: Invalid input(s): POCBNP CBG: No results for input(s): GLUCAP in the last 168 hours. D-Dimer No results for input(s): DDIMER in the last 72 hours. Hgb A1c No results for input(s): HGBA1C in the last 72 hours. Lipid Profile No results for input(s): CHOL, HDL, LDLCALC, TRIG, CHOLHDL, LDLDIRECT in the last 72 hours. Thyroid function studies No results for input(s): TSH, T4TOTAL, T3FREE, THYROIDAB in the last 72 hours.  Invalid input(s): FREET3 Anemia work up No results for input(s): VITAMINB12, FOLATE, FERRITIN, TIBC, IRON, RETICCTPCT in the last 72 hours. Urinalysis    Component Value Date/Time   COLORURINE RED (A) 12/31/2015 1900   APPEARANCEUR CLEAR 12/31/2015 1900    LABSPEC 1.017 12/31/2015 1900   PHURINE 5.0 12/31/2015 1900   GLUCOSEU NEGATIVE 12/31/2015 1900   HGBUR TRACE (A) 12/31/2015 1900   BILIRUBINUR SMALL (A) 12/31/2015 1900   KETONESUR NEGATIVE 12/31/2015 1900   PROTEINUR NEGATIVE 12/31/2015 1900   UROBILINOGEN 1.0 11/23/2014 1455   NITRITE POSITIVE (A) 12/31/2015 1900   LEUKOCYTESUR SMALL (A) 12/31/2015 1900   Sepsis Labs Invalid input(s): PROCALCITONIN,  WBC,  LACTICIDVEN Microbiology Recent Results (from the past 240  hour(s))  Urine culture     Status: None   Collection Time: 12/31/15  7:00 PM  Result Value Ref Range Status   Specimen Description URINE, CATHETERIZED  Final   Special Requests NONE  Final   Culture NO GROWTH Performed at [facility]   Final   Report Status 01/02/2016 FINAL  Final     Time coordinating discharge: Over 30 minutes  SIGNED:   Charlynne Cousins, MD  [facility] 01/04/2016, 11:18 AM Pager   If 7PM-7AM, please contact night-coverage [email or web address] Password [email or web address]

## 2016-01-04 NOTE — Clinical Social Work Placement (Signed)
Patient is set to discharge to [facility] today. Patient & wife, Brian [name] made aware. Discharge packet given to RN, Shirlee Limerick. [facility] called for transport to pickup at 12:30pm.     Raynaldo Opitz, [place] Social Worker cell #: [phone]    CLINICAL SOCIAL WORK PLACEMENT  NOTE  Date:  01/04/2016  Patient Details  Name: Brian [name] MRN: [id number] Date of Birth: 26-May-1940  Clinical Social Work is seeking post-discharge placement for this patient at the [place] level of care (*CSW will initial, date and re-position this form in  chart as items are completed):  Yes   Patient/family provided with [place] Work Department's list of facilities offering this level of care within the geographic area requested by the patient (or if unable, by the patient's family).  Yes   Patient/family informed of their freedom to choose among providers that offer the needed level of care, that participate in Medicare, Medicaid or managed care program needed by the patient, have an available bed and are willing to accept the patient.  Yes   Patient/family informed of [facility]'s ownership interest in [facility] and [facility], as well as of the fact that they are under no obligation to receive care at these facilities.  PASRR submitted to EDS on       PASRR number received on       Existing PASRR number confirmed on 01/02/16     FL2 transmitted to all facilities in geographic area requested by pt/family on 01/02/16     FL2 transmitted to all facilities within larger geographic area on       Patient informed that his/her managed care company has contracts with or will negotiate with certain facilities, including the following:        Yes   Patient/family informed of bed offers received.  Patient chooses bed at [facility]     Physician recommends and patient chooses bed at      Patient to be  transferred to [facility] on 01/04/16.  Patient to be transferred to facility by [facility]     Patient family notified on 01/04/16 of transfer.  Name of family member notified:  patient's wife, Brian [name] via phone     PHYSICIAN       Additional Comment:    _______________________________________________ Standley Brooking, LCSW 01/04/2016, 11:46 AM

## 2016-01-04 NOTE — Progress Notes (Signed)
Discharged  With foley catheter to [facility], transported by [facility], Report given to [facility].

## 2016-01-07 DIAGNOSIS — G20 Parkinson's disease: Secondary | ICD-10-CM | POA: Diagnosis not present

## 2016-01-07 DIAGNOSIS — G9340 Encephalopathy, unspecified: Secondary | ICD-10-CM | POA: Diagnosis not present

## 2016-01-07 DIAGNOSIS — I482 Chronic atrial fibrillation: Secondary | ICD-10-CM | POA: Diagnosis not present

## 2016-01-07 DIAGNOSIS — N401 Enlarged prostate with lower urinary tract symptoms: Secondary | ICD-10-CM | POA: Diagnosis not present

## 2016-01-07 DIAGNOSIS — N138 Other obstructive and reflux uropathy: Secondary | ICD-10-CM | POA: Diagnosis not present

## 2016-01-09 DIAGNOSIS — N401 Enlarged prostate with lower urinary tract symptoms: Secondary | ICD-10-CM | POA: Diagnosis not present

## 2016-01-09 DIAGNOSIS — R350 Frequency of micturition: Secondary | ICD-10-CM | POA: Diagnosis not present

## 2016-01-10 DIAGNOSIS — N401 Enlarged prostate with lower urinary tract symptoms: Secondary | ICD-10-CM | POA: Diagnosis not present

## 2016-01-10 DIAGNOSIS — N138 Other obstructive and reflux uropathy: Secondary | ICD-10-CM | POA: Diagnosis not present

## 2016-01-10 DIAGNOSIS — G9340 Encephalopathy, unspecified: Secondary | ICD-10-CM | POA: Diagnosis not present

## 2016-01-10 DIAGNOSIS — I482 Chronic atrial fibrillation: Secondary | ICD-10-CM | POA: Diagnosis not present

## 2016-01-10 DIAGNOSIS — G20 Parkinson's disease: Secondary | ICD-10-CM | POA: Diagnosis not present

## 2016-01-14 DIAGNOSIS — N182 Chronic kidney disease, stage 2 (mild): Secondary | ICD-10-CM | POA: Diagnosis not present

## 2016-01-14 DIAGNOSIS — I482 Chronic atrial fibrillation: Secondary | ICD-10-CM | POA: Diagnosis not present

## 2016-01-14 DIAGNOSIS — G20 Parkinson's disease: Secondary | ICD-10-CM | POA: Diagnosis not present

## 2016-01-14 DIAGNOSIS — W19XXXD Unspecified fall, subsequent encounter: Secondary | ICD-10-CM | POA: Diagnosis not present

## 2016-01-22 DIAGNOSIS — N182 Chronic kidney disease, stage 2 (mild): Secondary | ICD-10-CM | POA: Diagnosis not present

## 2016-01-22 DIAGNOSIS — I482 Chronic atrial fibrillation: Secondary | ICD-10-CM | POA: Diagnosis not present

## 2016-01-22 DIAGNOSIS — N138 Other obstructive and reflux uropathy: Secondary | ICD-10-CM | POA: Diagnosis not present

## 2016-01-22 DIAGNOSIS — R339 Retention of urine, unspecified: Secondary | ICD-10-CM | POA: Diagnosis not present

## 2016-01-22 DIAGNOSIS — N401 Enlarged prostate with lower urinary tract symptoms: Secondary | ICD-10-CM | POA: Diagnosis not present

## 2016-01-24 DIAGNOSIS — R350 Frequency of micturition: Secondary | ICD-10-CM | POA: Diagnosis not present

## 2016-01-28 ENCOUNTER — Ambulatory Visit (INDEPENDENT_AMBULATORY_CARE_PROVIDER_SITE_OTHER): Payer: Medicare Other | Admitting: *Deleted

## 2016-01-28 DIAGNOSIS — Z7901 Long term (current) use of anticoagulants: Secondary | ICD-10-CM | POA: Diagnosis not present

## 2016-01-28 DIAGNOSIS — I4891 Unspecified atrial fibrillation: Secondary | ICD-10-CM

## 2016-01-28 DIAGNOSIS — Z5181 Encounter for therapeutic drug level monitoring: Secondary | ICD-10-CM

## 2016-01-28 LAB — POCT INR: INR: 1.2

## 2016-01-31 DIAGNOSIS — R350 Frequency of micturition: Secondary | ICD-10-CM | POA: Diagnosis not present

## 2016-02-04 ENCOUNTER — Ambulatory Visit (INDEPENDENT_AMBULATORY_CARE_PROVIDER_SITE_OTHER): Payer: Medicare Other

## 2016-02-04 DIAGNOSIS — Z7901 Long term (current) use of anticoagulants: Secondary | ICD-10-CM

## 2016-02-04 DIAGNOSIS — Z5181 Encounter for therapeutic drug level monitoring: Secondary | ICD-10-CM | POA: Diagnosis not present

## 2016-02-04 DIAGNOSIS — I4891 Unspecified atrial fibrillation: Secondary | ICD-10-CM | POA: Diagnosis not present

## 2016-02-04 LAB — POCT INR: INR: 1.4

## 2016-02-11 ENCOUNTER — Ambulatory Visit (INDEPENDENT_AMBULATORY_CARE_PROVIDER_SITE_OTHER): Payer: Medicare Other | Admitting: *Deleted

## 2016-02-11 DIAGNOSIS — Z5181 Encounter for therapeutic drug level monitoring: Secondary | ICD-10-CM | POA: Diagnosis not present

## 2016-02-11 DIAGNOSIS — I4891 Unspecified atrial fibrillation: Secondary | ICD-10-CM | POA: Diagnosis not present

## 2016-02-11 DIAGNOSIS — Z7901 Long term (current) use of anticoagulants: Secondary | ICD-10-CM

## 2016-02-11 LAB — POCT INR: INR: 3

## 2016-02-21 DIAGNOSIS — R350 Frequency of micturition: Secondary | ICD-10-CM | POA: Diagnosis not present

## 2016-02-25 ENCOUNTER — Ambulatory Visit (INDEPENDENT_AMBULATORY_CARE_PROVIDER_SITE_OTHER): Payer: Medicare Other | Admitting: *Deleted

## 2016-02-25 DIAGNOSIS — Z7901 Long term (current) use of anticoagulants: Secondary | ICD-10-CM

## 2016-02-25 DIAGNOSIS — I4891 Unspecified atrial fibrillation: Secondary | ICD-10-CM | POA: Diagnosis not present

## 2016-02-25 DIAGNOSIS — Z5181 Encounter for therapeutic drug level monitoring: Secondary | ICD-10-CM | POA: Diagnosis not present

## 2016-02-25 LAB — POCT INR: INR: 2.3

## 2016-03-05 ENCOUNTER — Encounter: Payer: Self-pay | Admitting: Podiatry

## 2016-03-05 ENCOUNTER — Ambulatory Visit (INDEPENDENT_AMBULATORY_CARE_PROVIDER_SITE_OTHER): Payer: Medicare Other | Admitting: Podiatry

## 2016-03-05 VITALS — Ht 73.5 in | Wt 240.0 lb

## 2016-03-05 DIAGNOSIS — B351 Tinea unguium: Secondary | ICD-10-CM | POA: Diagnosis not present

## 2016-03-05 DIAGNOSIS — M79676 Pain in unspecified toe(s): Secondary | ICD-10-CM | POA: Diagnosis not present

## 2016-03-05 NOTE — Progress Notes (Signed)
Patient ID: Brian [name], male   DOB: September 03, 1940, 76 y.o.   MRN: [id number] Complaint:  Visit Type: Patient returns to my office for continued preventative foot care services. Complaint: Patient states" my nails have grown long and thick and become painful to walk and wear shoes" . The patient presents for preventative foot care services. No changes to ROS  Podiatric Exam: Vascular: dorsalis pedis and posterior tibial pulses are palpable bilateral. Capillary return is immediate. Temperature gradient is WNL. Skin turgor WNL  Sensorium: Normal Semmes Weinstein monofilament test. Normal tactile sensation bilaterally. Nail Exam: Pt has thick disfigured discolored nails with subungual debris noted bilateral entire nail hallux through fifth toenails Ulcer Exam: There is no evidence of ulcer or pre-ulcerative changes or infection. Orthopedic Exam: Muscle tone and strength are WNL. No limitations in general ROM. No crepitus or effusions noted. Foot type and digits show no abnormalities. Bony prominences are unremarkable. Skin: No Porokeratosis. No infection or ulcers  Diagnosis:  Onychomycosis, , Pain in right toe, pain in left toes,    Treatment & Plan Procedures and Treatment: Consent by patient was obtained for treatment procedures. The patient understood the discussion of treatment and procedures well. All questions were answered thoroughly reviewed. Debridement of mycotic and hypertrophic toenails, 1 through 5 bilateral and clearing of subungual debris. No ulceration, no infection noted. Return Visit-Office Procedure: Patient instructed to return to the office for a follow up visit 10 weeks  for continued evaluation and treatment.   Gardiner Barefoot DPM

## 2016-03-06 ENCOUNTER — Ambulatory Visit (INDEPENDENT_AMBULATORY_CARE_PROVIDER_SITE_OTHER): Payer: Medicare Other | Admitting: Neurology

## 2016-03-06 ENCOUNTER — Encounter: Payer: Self-pay | Admitting: Neurology

## 2016-03-06 VITALS — BP 134/75 | HR 57 | Resp 20 | Ht 74.0 in | Wt 238.0 lb

## 2016-03-06 DIAGNOSIS — Z471 Aftercare following joint replacement surgery: Secondary | ICD-10-CM | POA: Diagnosis not present

## 2016-03-06 DIAGNOSIS — I482 Chronic atrial fibrillation, unspecified: Secondary | ICD-10-CM

## 2016-03-06 DIAGNOSIS — G20 Parkinson's disease: Secondary | ICD-10-CM

## 2016-03-06 DIAGNOSIS — F418 Other specified anxiety disorders: Secondary | ICD-10-CM

## 2016-03-06 DIAGNOSIS — G4752 REM sleep behavior disorder: Secondary | ICD-10-CM | POA: Diagnosis not present

## 2016-03-06 DIAGNOSIS — Z9289 Personal history of other medical treatment: Secondary | ICD-10-CM

## 2016-03-06 DIAGNOSIS — Z96652 Presence of left artificial knee joint: Secondary | ICD-10-CM | POA: Diagnosis not present

## 2016-03-06 DIAGNOSIS — Z96651 Presence of right artificial knee joint: Secondary | ICD-10-CM

## 2016-03-06 DIAGNOSIS — F419 Anxiety disorder, unspecified: Secondary | ICD-10-CM

## 2016-03-06 MED ORDER — CARBIDOPA-LEVODOPA ER 50-200 MG PO TBCR: 1.0000 | EXTENDED_RELEASE_TABLET | Freq: Every day | ORAL | 3 refills | Status: DC

## 2016-03-06 MED ORDER — GABAPENTIN 100 MG PO CAPS: 100.0000 mg | ORAL_CAPSULE | Freq: Three times a day (TID) | ORAL | 3 refills | Status: DC

## 2016-03-06 MED ORDER — CARBIDOPA-LEVODOPA 25-100 MG PO TABS: 2.0000 | ORAL_TABLET | Freq: Four times a day (QID) | ORAL | 3 refills | Status: DC

## 2016-03-06 MED ORDER — ESCITALOPRAM OXALATE 10 MG PO TABS: 10.0000 mg | ORAL_TABLET | Freq: Every day | ORAL | 3 refills | Status: DC

## 2016-03-06 MED ORDER — CLONAZEPAM 0.5 MG PO TBDP: 0.5000 mg | ORAL_TABLET | Freq: Every day | ORAL | 3 refills | Status: DC

## 2016-03-06 NOTE — Patient Instructions (Signed)
We will continue with your medications with the exception that we are increasing your Lexapro to 10 mg daily.   Please resume your boxing class, and try to exercise in between, ask Dr. Tonita Cong about using the stationary bike [facility]

## 2016-03-06 NOTE — Progress Notes (Signed)
Subjective:    Patient ID: Brian [name] is a 75 y.o. male.  HPI     Interim history:   Brian [name] is a 75 year old right-handed gentleman with an underlying medical history of atrial fibrillation, hypertension, OSA, and hyperlipidemia who presents for followup consultation of his right-sided predominant Parkinson's disease complicated by bladder hyperactivity, anxiety and RBD. He is accompanied by his wife today and also GD. I last saw him on 10/22/2015, at which time he reported worse mobility, more stiffness in the right leg, more freezing episodes, difficulty getting out of bed especially at night when he would have to go to the bathroom. His wife would have to help him. Thankfully, he had no recent falls and was using a cane. He had finished physical therapy was not using his walker. He was not exercising very much to. He was more sedentary. He had right total knee replacement on 07/12/2015. I suggested we increase his Sinemet to 2 pills 4 times a day. We kept his gabapentin the same. We kept the Sinemet CR at bedtime the same.  Today, 03/06/2016: He reports doing better, R knee hurts, has appt with Dr. Tonita Cong at 1:30 today. Had one fall that led to the ER visit via EMS.   Of note, he was admitted to the hospital in October. She presented to the emergency room with urinary retention and nausea and vomiting. She was admitted from 12/31/2015 through 01/04/2016. I reviewed the hospital records. He had acute kidney injury. He had altered mental status which improved as his kidney function improved. CT head without contrast on 12/31/2015 showed: IMPRESSION: 1.  No acute intracranial abnormality. 2. Mild increase in generalized atrophy from 2013. Chronic small vessel ischemia is stable.  He was in the hospital for 4 days, then at [facility] in rehabilitation for about 10 days. He has ongoing issues with particularly right leg stiffness, no additional falls, does not appear to be very motivated to  exercise. He had been in the boxing class but since his hospitalization he did not go back and needs a note that he can resume boxing for Parkinson's disease. He has not been using a stationary bike. He tries to hydrate well. Does not always eat well and likes to eat sweets. Denies any frank sadness, but wife has noted anxiety, particularly at night. He uses his walker at night. They make sure he has enough light at night to go to the bathroom and back, until recently, his wife had to assist him every night and this became very difficult for her as she still works.   Previously:   I saw him on 04/26/2015, at which time he reported taking carbidopa/levodopa 25-100 milligrams strength, about 7 pills per day, 2 for the 1st dose, 1 1/2 pills for the 2nd and 4 th dose, 2 for the 3rd dose, and 1 CR at night. He finished physical therapy and was able to walk without a cane. He reported right knee pain and indicated that he may need right knee surgery as well.   I saw him on 01/18/2015, at which time he reported doing better slowly. He had finished home health physical therapy. He was supposed to start outpatient physical therapy through orthopedics. He had left total knee arthroplasty on 11/30/2014. I reviewed the operative note as well as his discharge summary. He was discharged on 12/03/2014 to inpatient rehabilitation. Of note, he presented to the emergency room on 12/16/2014 secondary to repeated vomiting. This was deemed secondary to constipation. Cardiac  enzymes were tested which were negative for any acute coronary syndrome. I reviewed the emergency room records. He had blood work which I reviewed. INR was 2.59. Hemoglobin was slightly low at 11.9 and hematocrit was 34.6. Troponin was negative 2. He overall felt a little better since we increased his Sinemet. He was taking a long-acting Sinemet CR at night. He had some off time in the middle of the night.  He felt he was making slow progress after his knee  replacement surgery. He had some issues with edema. He was wearing compression stockings for this. He was also placed on furosemide for a little while. He was no longer on narcotic pain medications. He was using a cane at the time. I suggested we continue with his medication regimen, he was alternating Sinemet 2 pills with 1-1/2 pills for a total of 4 doses a day.   He missed an appointment on 12/19/2014. I saw him on 08/22/2014, at which time he reported bilateral knee pain. He had seen Dr. Tonita Cong for his knee arthritis and had undergone injections into both knees. He did not think the injections helped a lot. He was exploring knee replacement surgery. He was having more difficulty getting out of chairs. He had no freezing. He did think that the increase in Sinemet had helped in the past. He was alternating 1 pill with 1-1/2 pills. He was taking Sinemet CR at night. He was on gabapentin 100 mg 3 times a day and Lexapro 5 mg daily as well as clonazepam 0.5 mg each night. He had no new memory or mood issues. He was overall moving slower. He had no recent falls.   In the interim, his wife called on 01/04/2015 reported that he had worsening symptoms. I suggested we increase his Sinemet slightly to 2 tablets alternating with 1-1/2 tablets.   I saw him on 06/01/2014, at which time he reported overall doing well. He was not able to sleep through the night. He was consistently waking up between 3 and 4 AM. He had nocturia twice on average. He felt improved from the bladder medication but still reported disrupted sleep. He felt clonazepam has helped. His wife agreed. He also felt that the increase in Sinemet was helpful in his motor function. It was difficult for him to keep up with the 5 pills a day schedule. He was going to the [facility] 3 times a week for about 30 minutes. Lexapro has helped his mood. His wife felt that he was less anxious. I suggested we increase his clonazepam to 0.5 mg each night. I suggested we  change his Sinemet to 1 pill alternating with one half pills for a total of 5 pills daily but for doses. I suggested he continue with Sinemet CR at bedtime.   I saw him on 03/27/2014, at which time he reported sleeping a little bit better but his tremor was worse. He did not feel Sinemet was lasting him 4 hours in between 2 different doses. He was going to the gym twice a week. He was back on an antidepressant. He was supposed to start a new bladder medication. He never actually started clonazepam for RBD. I prescribed clonazepam for him for RBD. I increased his Sinemet to one fill 5 times a day.   I saw him on 12/01/2013, at which time he reported, that he stopped Myrbetric due to new onset facial swelling, but this was no better after stopping it. His facial swelling may have started after the lexapro.  Adding long-acting Sinemet helped him sleep a little bit longer up to 3 AM. I suggested he stop Lexapro because of a possible allergic reaction to it. I also added low-dose clonazepam for his RBD and for his sleep.   I saw him on 09/29/2013 at which time he presented for a sooner than scheduled appointment because of increasing tremors and parkinsonian symptoms. He also felt more anxious but not frankly depressed. His wife felt that he may have been depressed. She reported dream enactments. She also reported loud snoring. He had a sleep study over 5 years ago, and that was negative for OSA at the time. He had started Myrbetriq recently. He has been taking gabapentin 3 times a day, and 7, 11 and 3. He takes Sinemet regularly at 7, 11, 3, and 7. I suggested that he start low-dose Lexapro at 5 mg strength. I changed the timing of his gabapentin to one pill at 7 AM, 1 pill at 11 AM and one pill at 7 PM. I asked him to continue with Sinemet 4 times a day. I asked him to add a long-acting Sinemet CR 50/200 mg strength once daily at bed time. I suggested he return for a sleep study. He had a diagnostic polysomnogram  on 11/09/2013 and I went over his test results with him in detail today. Sleep efficiency was reduced at 58.7% with a latency to sleep of 82 minutes and wake after sleep onset of 117 minutes with mild to moderate sleep fragmentation noted. He had an increased percentage of light stage sleep, absence of deep sleep and 16.8% of dream sleep with a prolonged REM latency. He had mild to moderate snoring. Total AHI was 3.2 per hour, he had some lack of REM atonia.   I saw him on 05/31/2013, at which time I felt he was fairly stable. I kept him on Sinemet 4 times a day and gabapentin 100 mg 3 times a day. He reported, going up to 4 times a day with his Sinemet helped. He reported no side effects with gabapentin or Sinemet and seemed to tolerate them well. He had some residual facial tingling which was tolerable to him. He has LBP and went to [facility]. He had an Xray of the back and was told there was degenerative disease and was given and was given a 3 week taper of oral steroids, which helped. He had stopped exercising d/t back pain and was scheduled for PT evaluation through ortho. He has been on metamucil for constipation which helped.   I saw him on 11/30/2012, at which time I suggested that he continue gabapentin 100 mg 1 pill 3 times a day for his paresthesias and encouraged him to continue Sinemet with 25/100 mg strength one tablet 4 times a day, at 7, 11, 3 PM and 7 PM.     I first met him on 06/22/2012 and he presented on 08/23/2012 for a sooner than scheduled appointment because he felt his medication was not helpful. He previously followed with Dr. Jeneen Rinks love and had been complaining of paresthesias. Dr. Erling Cruz had started him on low-dose gabapentin. For his parkinsonism which was first noted in June of 2012 he was tried on pramipexole which helped, but at his first visit with me he told me that his gabapentin was not helpful and he also discontinued pramipexole a few months prior because of swelling in  his feet and ankles. This improved after stopping both medications. At the time of his first visit with  me I felt he had mild parkinsonism without much in the way of lateralization. I started him on gabapentin again because of his paresthesias and also asked him to start low-dose Sinemet. He called back stating that the Sinemet was not helpful. He requested a sooner appointment. He had been continuing taking gabapentin 100 mg 3 times a day and Sinemet one pill 3 times a day with minimal improvement as he reported last time. I suggested at that visit that he increase his Sinemet to one pill 4 times a day and continue with gabapentin 100 mg 3 times a day. I also felt that he may have right sided predominant idiopathic Parkinson's disease due to an intermittent tremor noted only on the right side.      His Past Medical History Is Significant For: Past Medical History:  Diagnosis Date  . Abnormal PFT 1. 05/18/08  2. 11/30/08   1. Showed mild airflow obstruction, mild restriction, mild diffusion defect; FEV1 2.22(64%), FVC 3.33(65%), FEVi% 67, TLC 5.19(69%), DLCO 77%, +BD  2. FEV1 2.38(73%), FVC 3.81(80%), FEV1% 63, TLC 5.61(80%), DLCO 79%, no BD  . Allergic rhinitis   . Anxiety   . BPH (benign prostatic hyperplasia)   . Cellulitis    right leg MRSA  . Chronic atrial fibrillation ([place])   . Erectile dysfunction   . GERD (gastroesophageal reflux disease)   . Heart murmur    mild MR by echo  . Hyperlipidemia   . Hypertension   . Osteoarthritis   . Paresthesias 06/22/2012  . Parkinsonism ([place]) 06/22/2012  . Restrictive lung disease     His Past Surgical History Is Significant For: Past Surgical History:  Procedure Laterality Date  . CARDIAC CATHETERIZATION  11/2009   normal coronary arteries  . CARDIOVERSION     multiple  . CATARACT EXTRACTION    . COLONOSCOPY    . TOTAL KNEE ARTHROPLASTY Left 11/30/2014   Procedure: TOTAL KNEE ARTHROPLASTY;  Surgeon: Susa Day, MD;  Location: [facility];  Service:  Orthopedics;  Laterality: Left;  . TOTAL KNEE ARTHROPLASTY Right 07/12/2015   Procedure: RIGHT TOTAL KNEE ARTHROPLASTY;  Surgeon: Susa Day, MD;  Location: [facility];  Service: Orthopedics;  Laterality: Right;    His Family History Is Significant For: Family History  Problem Relation Age of Onset  . Diabetes Mother     DM  . Stroke Father     CVA  . Pancreatic cancer Brother   . Pancreatic cancer Other     Nephew    His Social History Is Significant For: Social History   Social History  . Marital status: Married    Spouse name: [name]  . Number of children: 4  . Years of education: masters   Occupational History  . Benefit analyst     Worked with [employer]   Social History Main Topics  . Smoking status: Never Smoker  . Smokeless tobacco: Never Used  . Alcohol use No  . Drug use: No  . Sexual activity: Not Asked   Other Topics Concern  . None   Social History Narrative   Married and lives in [place].  Retired Optometrist.   No reported caffeine use.     His Allergies Are:  Allergies  Allergen Reactions  . Lisinopril Cough  :   His Current Medications Are:  Outpatient Encounter Prescriptions as of 03/06/2016  Medication Sig  . carbidopa-levodopa (SINEMET CR) 50-200 MG tablet Take 1 tablet by mouth at bedtime.  . carbidopa-levodopa (SINEMET IR) 25-100  MG tablet Take 2 tablets by mouth 4 (four) times daily.  . cholecalciferol (VITAMIN D) 1000 units tablet Take 1,000 Units by mouth daily.  . clonazePAM (KLONOPIN) 0.5 MG disintegrating tablet Take 0.5 mg by mouth at bedtime.  . diphenhydramine-acetaminophen (TYLENOL PM) 25-500 MG TABS tablet Take 1-2 tablets by mouth at bedtime as needed (for sleep/pain).  Marland Kitchen doxazosin (CARDURA) 2 MG tablet Take 2 mg by mouth at bedtime.   Marland Kitchen escitalopram (LEXAPRO) 5 MG tablet Take 5 mg by mouth at bedtime.  . famotidine (PEPCID) 40 MG tablet Take 40 mg by mouth daily.   . furosemide (LASIX) 20 MG tablet Take 20 mg by mouth  daily.   Marland Kitchen gabapentin (NEURONTIN) 100 MG capsule Take 1 capsule (100 mg total) by mouth 3 (three) times daily.  . Multiple Vitamin (MULTIVITAMIN WITH MINERALS) TABS tablet Take 1 tablet by mouth daily.   . naproxen sodium (ANAPROX) 220 MG tablet Take 440 mg by mouth at bedtime as needed (for pain).  . nebivolol (BYSTOLIC) 10 MG tablet Take 10 mg by mouth every evening.  . potassium chloride (K-DUR,KLOR-CON) 10 MEQ tablet Take 10 mEq by mouth daily.  . pravastatin (PRAVACHOL) 40 MG tablet Take 40 mg by mouth daily.   . psyllium (HYDROCIL/METAMUCIL) 95 % PACK Take 1 packet by mouth daily.  . tamsulosin (FLOMAX) 0.4 MG CAPS capsule Take 1 capsule (0.4 mg total) by mouth 2 (two) times daily.  . valsartan (DIOVAN) 160 MG tablet Take 160 mg by mouth daily.   Marland Kitchen warfarin (COUMADIN) 5 MG tablet Take 5 mg by mouth every evening. Pt DOES NOT take this dose on Wednesday.  . warfarin (COUMADIN) 7.5 MG tablet Take 7.5 mg by mouth every evening.   No facility-administered encounter medications on file as of 03/06/2016.   :  Review of Systems:  Out of a complete 14 point review of systems, all are reviewed and negative with the exception of these symptoms as listed below:  Review of Systems  Neurological:       Pt presents today to discuss his parkinson's disease. Pt has no complaints today.    Objective:  Neurologic Exam  Physical Exam Physical Examination:   Vitals:   03/06/16 0827  BP: 134/75  Pulse: (!) 57  Resp: 20   General Examination: The patient is a very pleasant 75 y.o. male in no acute distress. He appears well-developed and well-nourished and well groomed. He is overweight. He is in more somber mood today, quieter.   HEENT: Normocephalic, atraumatic, pupils are equal, round and reactive to light and accommodation. Extraocular tracking shows mild to moderate saccadic breakdown without nystagmus noted. There is no limitation to gaze. There is no apraxia of eyelid opening. He has  bilateral cataract repairs. There is moderate decrease in eye blink rate. Hearing is intact. Face is symmetric with moderate facial masking and normal facial sensation. There is an intermittent lower lip, chin and head tremor. This is about the same. Neck is moderately rigid with intact passive ROM. There are no carotid bruits on auscultation. Oropharynx exam reveals no mouth dryness. No significant airway crowding is noted, tonsils are absent, but he does have a floppy appearing soft palate and his uvula reaches far down. Mallampati is class II. Tongue protrudes centrally and palate elevates symmetrically.    Chest: is clear to auscultation without wheezing, rhonchi or crackles noted.  Heart: sounds are irregularly irregular without murmurs, rubs or gallops noted. Mild bradycardia.  Abdomen: is soft, non-tender  and non-distended with normal bowel sounds appreciated on auscultation.  Extremities: There is 1 + edema in the ankles and above bilaterally, R>L, he is not wearing his  compression stockings. There are chronic appearing discolorations in the distal legs.  Skin: is warm and dry with no trophic changes noted. Chronic stasis like changes are seen in the distal legs bilateral.   Musculoskeletal: exam reveals no obvious joint deformities, tenderness or joint swelling or erythema, except for bilateral knee swelling, L>Right side   Neurologically:   Mental status: The patient is awake and alert, paying good attention. He is able to to provide some history, but wife provides most Hx today. He is oriented to: person, place, time/date, situation, day of week, month of year and year. His memory, attention, language and knowledge are appropriate. There is no aphasia, agnosia, apraxia or anomia. There is a mild degree of bradyphrenia. Speech is mildly hypophonic with no dysarthria noted. Mood is congruent and affect is normal.  Cranial nerves are as described above under HEENT exam. In addition,  shoulder shrug is normal with equal shoulder height noted.  Motor exam: Normal bulk, and strength for age is noted. Tone is mild to moderately rigid with absence of cogwheeling in the bilateral extremities. Tone is increased on the R>L. There is an intermittent right upper extremity tremor at rest, unchanged. There is overall moderate bradykinesia. There is no drift or rebound. Romberg is not possible today. Reflexes are 1+ in the upper extremities and 1+ in the lower extremities. Fine motor skills exam reveals: Finger taps are moderately b/l, right more than left, hand movements are mild to moderately impaired, right more pronounced. RAP (rapid alternating patting) is mildly impaired on the right and mildly impaired on the left. Foot taps are moderately to severely impaired on the right and moderately impaired on the left. Foot agility (in the form of heel stomping) is mild to moderately impaired on the right and mildly impaired on the left.    Cerebellar testing shows no dysmetria or intention tremor on finger to nose testing. There is no truncal or gait ataxia.   Sensory exam is intact to light touch in the UEs and LEs.   Gait, station and balance exam: He stands up from the seated position with moderate difficulty, requiring 2 or 3 attempts, mild assistance noted, his posture is more stooped, moderate in degree and he slightly leans to the left. He stands wide-based. He walks with significant difficulty today, no freezing, no cane. Tandem walk is not possible, balance is at least mildly, if not moderately impaired today.    Assessment and Plan:   In summary, DAVYON FISCH is a very pleasant 75 year old male with a history of atrial fibrillation, hypertension, hyperlipidemia, who presents for followup consultation of his right-sided predominant Parkinson's disease, most likely akinetic-rigid type, complicated by anxiety, mild depression, sleep disorder, including sleep fragmentation, nocturia,  insomnia, obesity, arthritis, and RBD and recent status post left (2016), then right (2017) total knee replacement surgeries. He is recovering from these, Had a recent setback with hospitalization post fall and was found to have urinary retention, UTI, acute encephalopathy, all of which cleared and he was in inpatient rehabilitation as well. We had increased Sinemet to 2 pills 4 times a day, he tolerates this and Sinemet as effective, can tell that he is due for his next dose typically, tries to take it for hourly. I suggested we continue with Sinemet, Sinemet CR, gabapentin for nerve pain and  low-dose clonazepam for his RBD. In addition, I suggested we increase his Lexapro from 5 mg to 10 mg. He is reluctant but agreeable. I explained to him that he may benefit from 10 mg Lexapro and that it may help him with his motivation and initiative and sometimes also with pain. We can certainly try this for a couple of months and see how it goes. I refilled all 5 prescriptions today. I suggested a four-month checkup, sooner as necessary. I answered all their questions today and the patient and his wife were in agreement. I spent 25 minutes in total face-to-face time with the patient, more than 50% of which was spent in counseling and coordination of care, reviewing test results, reviewing medication and discussing or reviewing the diagnosis of PD, its prognosis and treatment options.

## 2016-03-13 DIAGNOSIS — G20 Parkinson's disease: Secondary | ICD-10-CM | POA: Diagnosis not present

## 2016-03-13 DIAGNOSIS — M25561 Pain in right knee: Secondary | ICD-10-CM | POA: Diagnosis not present

## 2016-03-13 DIAGNOSIS — N2889 Other specified disorders of kidney and ureter: Secondary | ICD-10-CM | POA: Diagnosis not present

## 2016-03-13 DIAGNOSIS — N179 Acute kidney failure, unspecified: Secondary | ICD-10-CM | POA: Diagnosis not present

## 2016-03-13 DIAGNOSIS — I482 Chronic atrial fibrillation: Secondary | ICD-10-CM | POA: Diagnosis not present

## 2016-03-13 DIAGNOSIS — R269 Unspecified abnormalities of gait and mobility: Secondary | ICD-10-CM | POA: Diagnosis not present

## 2016-03-20 DIAGNOSIS — R350 Frequency of micturition: Secondary | ICD-10-CM | POA: Diagnosis not present

## 2016-03-20 DIAGNOSIS — M25661 Stiffness of right knee, not elsewhere classified: Secondary | ICD-10-CM | POA: Diagnosis not present

## 2016-03-26 ENCOUNTER — Ambulatory Visit (INDEPENDENT_AMBULATORY_CARE_PROVIDER_SITE_OTHER): Payer: Medicare Other | Admitting: Pharmacist

## 2016-03-26 DIAGNOSIS — Z5181 Encounter for therapeutic drug level monitoring: Secondary | ICD-10-CM | POA: Diagnosis not present

## 2016-03-26 DIAGNOSIS — I4891 Unspecified atrial fibrillation: Secondary | ICD-10-CM | POA: Diagnosis not present

## 2016-03-26 DIAGNOSIS — Z7901 Long term (current) use of anticoagulants: Secondary | ICD-10-CM

## 2016-03-26 DIAGNOSIS — M25661 Stiffness of right knee, not elsewhere classified: Secondary | ICD-10-CM | POA: Diagnosis not present

## 2016-03-26 LAB — POCT INR: INR: 2.1

## 2016-03-26 MED ORDER — WARFARIN SODIUM 5 MG PO TABS: ORAL_TABLET | ORAL | 0 refills | Status: DC

## 2016-03-28 DIAGNOSIS — M25661 Stiffness of right knee, not elsewhere classified: Secondary | ICD-10-CM | POA: Diagnosis not present

## 2016-04-08 DIAGNOSIS — M25661 Stiffness of right knee, not elsewhere classified: Secondary | ICD-10-CM | POA: Diagnosis not present

## 2016-04-15 DIAGNOSIS — M25661 Stiffness of right knee, not elsewhere classified: Secondary | ICD-10-CM | POA: Diagnosis not present

## 2016-04-15 DIAGNOSIS — R350 Frequency of micturition: Secondary | ICD-10-CM | POA: Diagnosis not present

## 2016-04-16 DIAGNOSIS — I482 Chronic atrial fibrillation: Secondary | ICD-10-CM | POA: Diagnosis not present

## 2016-04-16 DIAGNOSIS — E782 Mixed hyperlipidemia: Secondary | ICD-10-CM | POA: Diagnosis not present

## 2016-04-16 DIAGNOSIS — I519 Heart disease, unspecified: Secondary | ICD-10-CM | POA: Diagnosis not present

## 2016-04-16 DIAGNOSIS — Z1211 Encounter for screening for malignant neoplasm of colon: Secondary | ICD-10-CM | POA: Diagnosis not present

## 2016-04-16 DIAGNOSIS — N182 Chronic kidney disease, stage 2 (mild): Secondary | ICD-10-CM | POA: Diagnosis not present

## 2016-04-16 DIAGNOSIS — D696 Thrombocytopenia, unspecified: Secondary | ICD-10-CM | POA: Diagnosis not present

## 2016-04-16 DIAGNOSIS — Z23 Encounter for immunization: Secondary | ICD-10-CM | POA: Diagnosis not present

## 2016-04-16 DIAGNOSIS — G20 Parkinson's disease: Secondary | ICD-10-CM | POA: Diagnosis not present

## 2016-04-16 DIAGNOSIS — Z1389 Encounter for screening for other disorder: Secondary | ICD-10-CM | POA: Diagnosis not present

## 2016-04-16 DIAGNOSIS — K219 Gastro-esophageal reflux disease without esophagitis: Secondary | ICD-10-CM | POA: Diagnosis not present

## 2016-04-16 DIAGNOSIS — M1990 Unspecified osteoarthritis, unspecified site: Secondary | ICD-10-CM | POA: Diagnosis not present

## 2016-04-16 DIAGNOSIS — R269 Unspecified abnormalities of gait and mobility: Secondary | ICD-10-CM | POA: Diagnosis not present

## 2016-04-16 DIAGNOSIS — I10 Essential (primary) hypertension: Secondary | ICD-10-CM | POA: Diagnosis not present

## 2016-04-16 DIAGNOSIS — N400 Enlarged prostate without lower urinary tract symptoms: Secondary | ICD-10-CM | POA: Diagnosis not present

## 2016-04-16 DIAGNOSIS — Z0000 Encounter for general adult medical examination without abnormal findings: Secondary | ICD-10-CM | POA: Diagnosis not present

## 2016-04-17 DIAGNOSIS — M25661 Stiffness of right knee, not elsewhere classified: Secondary | ICD-10-CM | POA: Diagnosis not present

## 2016-04-22 DIAGNOSIS — M25661 Stiffness of right knee, not elsewhere classified: Secondary | ICD-10-CM | POA: Diagnosis not present

## 2016-04-23 ENCOUNTER — Ambulatory Visit (INDEPENDENT_AMBULATORY_CARE_PROVIDER_SITE_OTHER): Payer: Medicare Other | Admitting: *Deleted

## 2016-04-23 DIAGNOSIS — Z5181 Encounter for therapeutic drug level monitoring: Secondary | ICD-10-CM

## 2016-04-23 DIAGNOSIS — I4891 Unspecified atrial fibrillation: Secondary | ICD-10-CM | POA: Diagnosis not present

## 2016-04-23 DIAGNOSIS — Z7901 Long term (current) use of anticoagulants: Secondary | ICD-10-CM | POA: Diagnosis not present

## 2016-04-23 LAB — POCT INR: INR: 2.9

## 2016-04-24 DIAGNOSIS — M25661 Stiffness of right knee, not elsewhere classified: Secondary | ICD-10-CM | POA: Diagnosis not present

## 2016-04-29 DIAGNOSIS — M25661 Stiffness of right knee, not elsewhere classified: Secondary | ICD-10-CM | POA: Diagnosis not present

## 2016-05-01 DIAGNOSIS — M25661 Stiffness of right knee, not elsewhere classified: Secondary | ICD-10-CM | POA: Diagnosis not present

## 2016-05-13 DIAGNOSIS — R350 Frequency of micturition: Secondary | ICD-10-CM | POA: Diagnosis not present

## 2016-05-14 ENCOUNTER — Encounter: Payer: Self-pay | Admitting: Podiatry

## 2016-05-14 ENCOUNTER — Ambulatory Visit (INDEPENDENT_AMBULATORY_CARE_PROVIDER_SITE_OTHER): Payer: Medicare Other | Admitting: Podiatry

## 2016-05-14 DIAGNOSIS — M79676 Pain in unspecified toe(s): Secondary | ICD-10-CM

## 2016-05-14 DIAGNOSIS — B351 Tinea unguium: Secondary | ICD-10-CM

## 2016-05-14 NOTE — Progress Notes (Signed)
Patient ID: CHRISTOFHER EASTMAN, male   DOB: 04/17/1940, 76 y.o.   MRN: [id number] Complaint:  Visit Type: Patient returns to my office for continued preventative foot care services. Complaint: Patient states" my nails have grown long and thick and become painful to walk and wear shoes" . The patient presents for preventative foot care services. No changes to ROS  Podiatric Exam: Vascular: dorsalis pedis and posterior tibial pulses are palpable bilateral. Capillary return is immediate. Temperature gradient is WNL. Skin turgor WNL  Sensorium: Normal Semmes Weinstein monofilament test. Normal tactile sensation bilaterally. Nail Exam: Pt has thick disfigured discolored nails with subungual debris noted bilateral entire nail hallux through fifth toenails Ulcer Exam: There is no evidence of ulcer or pre-ulcerative changes or infection. Orthopedic Exam: Muscle tone and strength are WNL. No limitations in general ROM. No crepitus or effusions noted. Foot type and digits show no abnormalities. Bony prominences are unremarkable. Skin: No Porokeratosis. No infection or ulcers  Diagnosis:  Onychomycosis, , Pain in right toe, pain in left toes,    Treatment & Plan Procedures and Treatment: Consent by patient was obtained for treatment procedures. The patient understood the discussion of treatment and procedures well. All questions were answered thoroughly reviewed. Debridement of mycotic and hypertrophic toenails, 1 through 5 bilateral and clearing of subungual debris. No ulceration, no infection noted. Discussed heel callus.  Told to use pillows. Return Visit-Office Procedure: Patient instructed to return to the office for a follow up visit 10 weeks  for continued evaluation and treatment.   Gardiner Barefoot DPM

## 2016-05-21 ENCOUNTER — Ambulatory Visit (INDEPENDENT_AMBULATORY_CARE_PROVIDER_SITE_OTHER): Payer: Medicare Other | Admitting: *Deleted

## 2016-05-21 DIAGNOSIS — I4891 Unspecified atrial fibrillation: Secondary | ICD-10-CM | POA: Diagnosis not present

## 2016-05-21 DIAGNOSIS — Z5181 Encounter for therapeutic drug level monitoring: Secondary | ICD-10-CM

## 2016-05-21 DIAGNOSIS — Z7901 Long term (current) use of anticoagulants: Secondary | ICD-10-CM | POA: Diagnosis not present

## 2016-05-21 LAB — POCT INR: INR: 3.1

## 2016-06-10 DIAGNOSIS — R350 Frequency of micturition: Secondary | ICD-10-CM | POA: Diagnosis not present

## 2016-06-11 ENCOUNTER — Ambulatory Visit (INDEPENDENT_AMBULATORY_CARE_PROVIDER_SITE_OTHER): Payer: Medicare Other | Admitting: *Deleted

## 2016-06-11 DIAGNOSIS — I4891 Unspecified atrial fibrillation: Secondary | ICD-10-CM | POA: Diagnosis not present

## 2016-06-11 DIAGNOSIS — Z5181 Encounter for therapeutic drug level monitoring: Secondary | ICD-10-CM | POA: Diagnosis not present

## 2016-06-11 DIAGNOSIS — Z7901 Long term (current) use of anticoagulants: Secondary | ICD-10-CM | POA: Diagnosis not present

## 2016-06-11 LAB — POCT INR: INR: 2.4

## 2016-06-15 IMAGING — CR DG KNEE 1-2V PORT*L*
1 series · 2 of 2 positions shown · non-contrast
Comparison: 11/23/2014

CLINICAL DATA: Postoperative left knee radiograph, post knee
arthroplasty.

EXAM:
PORTABLE LEFT KNEE - 1-2 VIEW

[Series 1: AP · left · 2 of 2 slices shown]
[im 1/2]
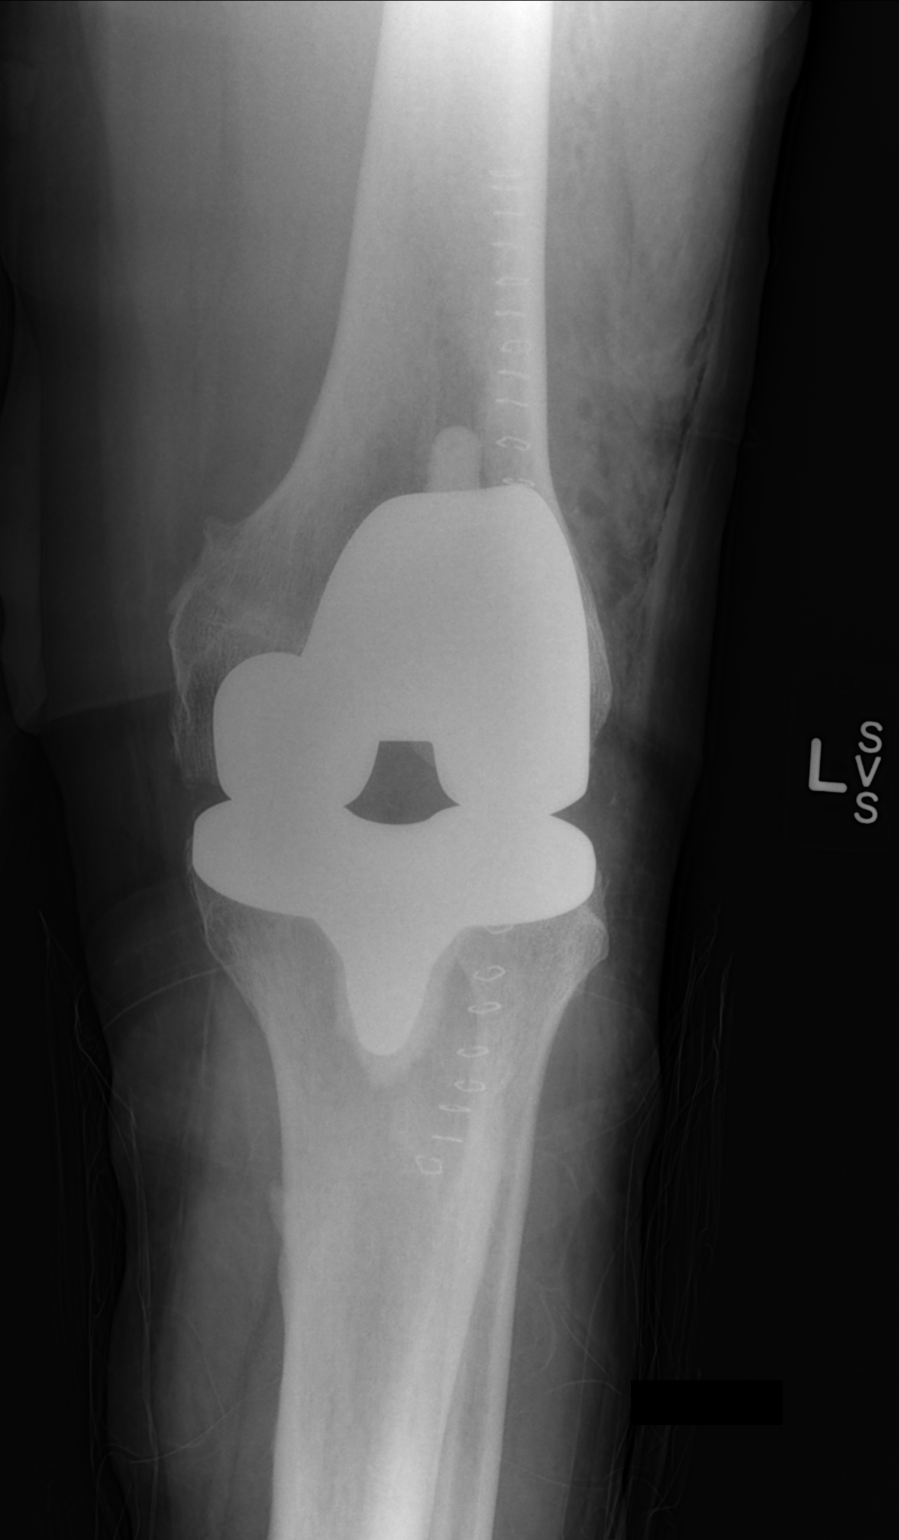
[im 2/2]
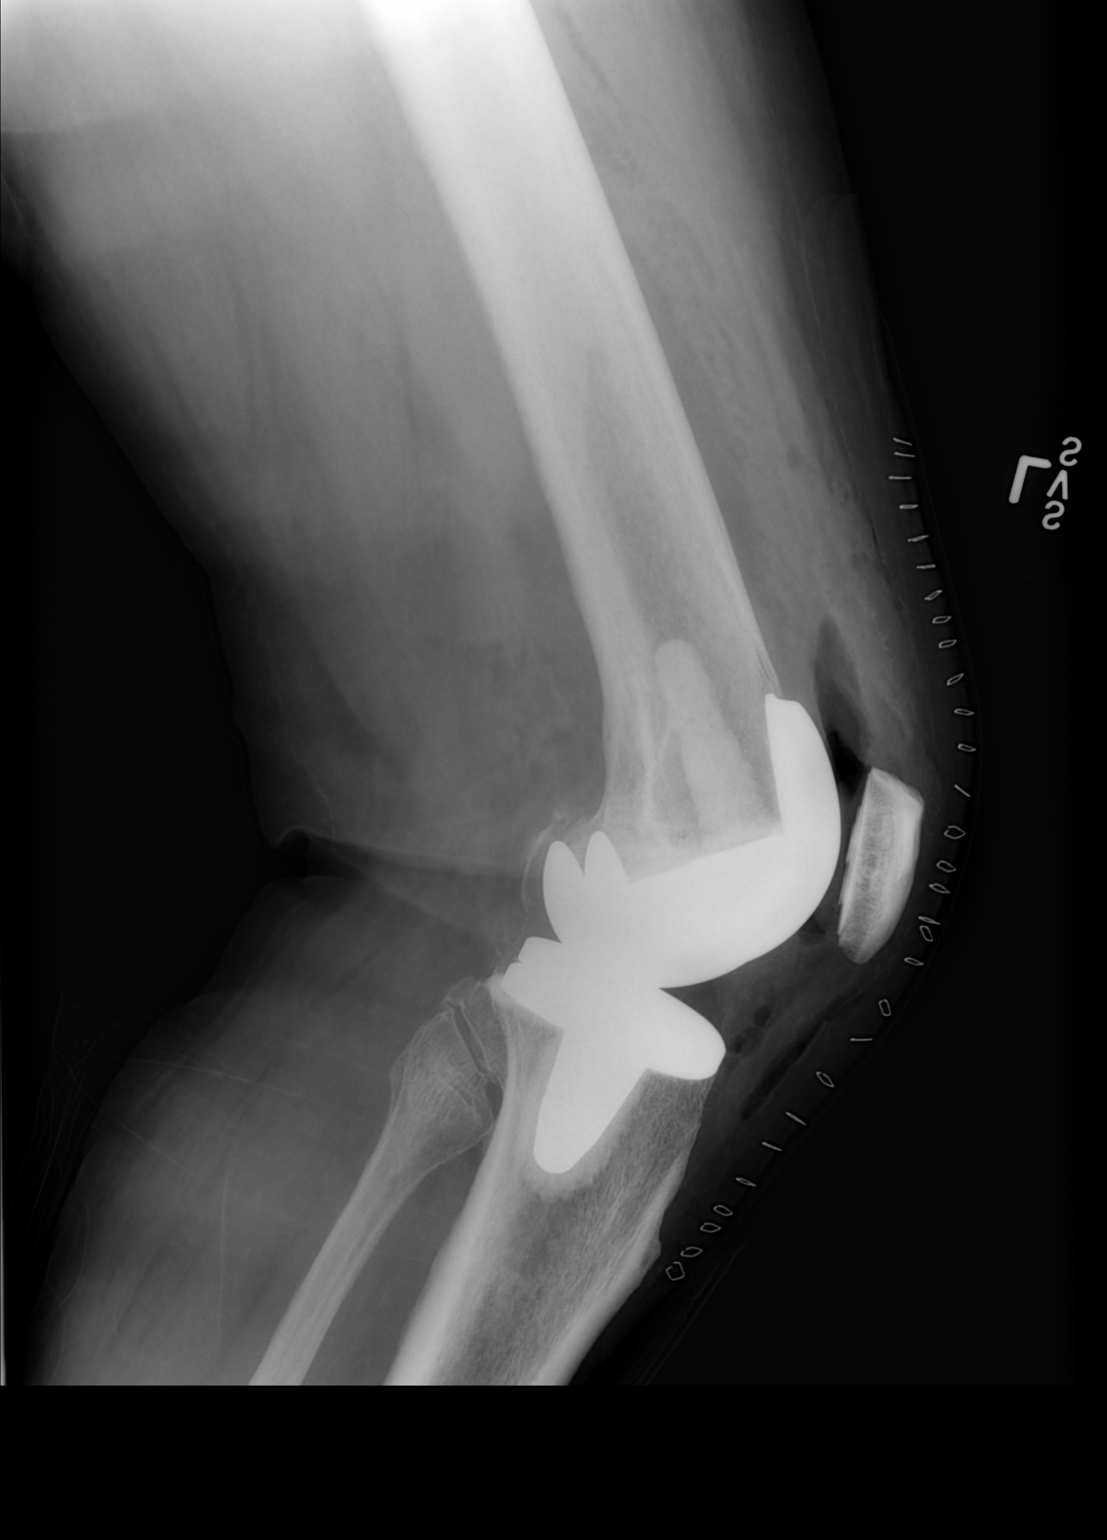

[2 of 2 positions shown; findings below may reference images not displayed]

FINDINGS: There has been interval total knee arthroplasty. The prosthetic
components are well seated. No evidence of immediate complications.
There are expected postsurgical changes such is soft tissue swelling
and subcutaneous emphysema. Gas within the suprapatellar joint space
is also seen. Skin staples are noted.
IMPRESSION: Status post total left knee arthroplasty. No evidence of immediate
complications.

## 2016-07-01 DIAGNOSIS — R350 Frequency of micturition: Secondary | ICD-10-CM | POA: Diagnosis not present

## 2016-07-09 ENCOUNTER — Encounter: Payer: Self-pay | Admitting: Neurology

## 2016-07-09 ENCOUNTER — Ambulatory Visit (INDEPENDENT_AMBULATORY_CARE_PROVIDER_SITE_OTHER): Payer: Medicare Other | Admitting: Neurology

## 2016-07-09 VITALS — BP 133/83 | HR 61 | Ht 73.0 in | Wt 232.0 lb

## 2016-07-09 DIAGNOSIS — Z96652 Presence of left artificial knee joint: Secondary | ICD-10-CM

## 2016-07-09 DIAGNOSIS — F418 Other specified anxiety disorders: Secondary | ICD-10-CM

## 2016-07-09 DIAGNOSIS — Z96651 Presence of right artificial knee joint: Secondary | ICD-10-CM | POA: Diagnosis not present

## 2016-07-09 DIAGNOSIS — G4752 REM sleep behavior disorder: Secondary | ICD-10-CM | POA: Diagnosis not present

## 2016-07-09 DIAGNOSIS — G20 Parkinson's disease: Secondary | ICD-10-CM | POA: Diagnosis not present

## 2016-07-09 MED ORDER — CLONAZEPAM 0.5 MG PO TBDP: 0.5000 mg | ORAL_TABLET | Freq: Every day | ORAL | 3 refills | Status: DC

## 2016-07-09 NOTE — Progress Notes (Signed)
Subjective:    Patient ID: Brian [name] is a 76 y.o. male.  HPI     Interim history:   Brian [name] is a 76 year old right-handed gentleman with an underlying medical history of atrial fibrillation, hypertension, OSA, and hyperlipidemia who presents for followup consultation of his right-sided predominant Parkinson's disease complicated by bladder hyperactivity, anxiety and RBD. He is accompanied by his wife's cousin today. I last saw him on 03/06/2016 at which time he reported doing better, he had some residual right knee pain. He had an appointment with his orthopedic surgeon. He reported one fall that led to an ER visit via EMS. He also had an admission to the hospital in October 2017 secondary to nausea, vomiting, urinary retention. He was noted to have altered mental status and acute kidney injury. He had a head CT without contrast on 12/31/2015 which showed:   1.  No acute intracranial abnormality. 2. Mild increase in generalized atrophy from 2013. Chronic small vessel ischemia is stable.   He was in the hospital for 4 days, then at [facility] in rehabilitation for about 10 days. He has ongoing issues with particularly right leg stiffness, no additional falls, does not appear to be very motivated to exercise. He had been in the PD boxing class. I suggested we continue with Sinemet 2 pills 4 times a day, Sinemet CR at bedtime, gabapentin, clonazepam for RBD at night and the only change was an increase in Lexapro from 5 mg to 10 mg daily.  Today, 07/09/2016: He reports25  The patient's allergies, current medications, family history, past medical history, past social history, past surgical history and problem list were reviewed and updated as appropriate.   Previously (copied from previous notes for reference):   I saw him on 10/22/2015, at which time he reported worse mobility, more stiffness in the right leg, more freezing episodes, difficulty getting out of bed especially at night when  he would have to go to the bathroom. His wife would have to help him. Thankfully, he had no recent falls and was using a cane. He had finished physical therapy was not using his walker. He was not exercising very much to. He was more sedentary. He had right total knee replacement on 07/12/2015. I suggested we increase his Sinemet to 2 pills 4 times a day. We kept his gabapentin the same. We kept the Sinemet CR at bedtime the same.    I saw him on 04/26/2015, at which time he reported taking carbidopa/levodopa 25-100 milligrams strength, about 7 pills per day, 2 for the 1st dose, 1 1/2 pills for the 2nd and 4 th dose, 2 for the 3rd dose, and 1 CR at night. He finished physical therapy and was able to walk without a cane. He reported right knee pain and indicated that he may need right knee surgery as well.   I saw him on 01/18/2015, at which time he reported doing better slowly. He had finished home health physical therapy. He was supposed to start outpatient physical therapy through orthopedics. He had left total knee arthroplasty on 11/30/2014. I reviewed the operative note as well as his discharge summary. He was discharged on 12/03/2014 to inpatient rehabilitation. Of note, he presented to the emergency room on 12/16/2014 secondary to repeated vomiting. This was deemed secondary to constipation. Cardiac enzymes were tested which were negative for any acute coronary syndrome. I reviewed the emergency room records. He had blood work which I reviewed. INR was 2.59. Hemoglobin was slightly low  at 11.9 and hematocrit was 34.6. Troponin was negative 2. He overall felt a little better since we increased his Sinemet. He was taking a long-acting Sinemet CR at night. He had some off time in the middle of the night.  He felt he was making slow progress after his knee replacement surgery. He had some issues with edema. He was wearing compression stockings for this. He was also placed on furosemide for a little while.  He was no longer on narcotic pain medications. He was using a cane at the time. I suggested we continue with his medication regimen, he was alternating Sinemet 2 pills with 1-1/2 pills for a total of 4 doses a day.   He missed an appointment on 12/19/2014. I saw him on 08/22/2014, at which time he reported bilateral knee pain. He had seen Dr. Tonita Cong for his knee arthritis and had undergone injections into both knees. He did not think the injections helped a lot. He was exploring knee replacement surgery. He was having more difficulty getting out of chairs. He had no freezing. He did think that the increase in Sinemet had helped in the past. He was alternating 1 pill with 1-1/2 pills. He was taking Sinemet CR at night. He was on gabapentin 100 mg 3 times a day and Lexapro 5 mg daily as well as clonazepam 0.5 mg each night. He had no new memory or mood issues. He was overall moving slower. He had no recent falls.   In the interim, his wife called on 01/04/2015 reported that he had worsening symptoms. I suggested we increase his Sinemet slightly to 2 tablets alternating with 1-1/2 tablets.   I saw him on 06/01/2014, at which time he reported overall doing well. He was not able to sleep through the night. He was consistently waking up between 3 and 4 AM. He had nocturia twice on average. He felt improved from the bladder medication but still reported disrupted sleep. He felt clonazepam has helped. His wife agreed. He also felt that the increase in Sinemet was helpful in his motor function. It was difficult for him to keep up with the 5 pills a day schedule. He was going to the [facility] 3 times a week for about 30 minutes. Lexapro has helped his mood. His wife felt that he was less anxious. I suggested we increase his clonazepam to 0.5 mg each night. I suggested we change his Sinemet to 1 pill alternating with one half pills for a total of 5 pills daily but for doses. I suggested he continue with Sinemet CR at  bedtime.   I saw him on 03/27/2014, at which time he reported sleeping a little bit better but his tremor was worse. He did not feel Sinemet was lasting him 4 hours in between 2 different doses. He was going to the gym twice a week. He was back on an antidepressant. He was supposed to start a new bladder medication. He never actually started clonazepam for RBD. I prescribed clonazepam for him for RBD. I increased his Sinemet to one fill 5 times a day.   I saw him on 12/01/2013, at which time he reported, that he stopped Myrbetric due to new onset facial swelling, but this was no better after stopping it. His facial swelling may have started after the lexapro. Adding long-acting Sinemet helped him sleep a little bit longer up to 3 AM. I suggested he stop Lexapro because of a possible allergic reaction to it. I also added low-dose  clonazepam for his RBD and for his sleep.   I saw him on 09/29/2013 at which time he presented for a sooner than scheduled appointment because of increasing tremors and parkinsonian symptoms. He also felt more anxious but not frankly depressed. His wife felt that he may have been depressed. She reported dream enactments. She also reported loud snoring. He had a sleep study over 5 years ago, and that was negative for OSA at the time. He had started Myrbetriq recently. He has been taking gabapentin 3 times a day, and 7, 11 and 3. He takes Sinemet regularly at 7, 11, 3, and 7. I suggested that he start low-dose Lexapro at 5 mg strength. I changed the timing of his gabapentin to one pill at 7 AM, 1 pill at 11 AM and one pill at 7 PM. I asked him to continue with Sinemet 4 times a day. I asked him to add a long-acting Sinemet CR 50/200 mg strength once daily at bed time. I suggested he return for a sleep study. He had a diagnostic polysomnogram on 11/09/2013 and I went over his test results with him in detail today. Sleep efficiency was reduced at 58.7% with a latency to sleep of 82 minutes  and wake after sleep onset of 117 minutes with mild to moderate sleep fragmentation noted. He had an increased percentage of light stage sleep, absence of deep sleep and 16.8% of dream sleep with a prolonged REM latency. He had mild to moderate snoring. Total AHI was 3.2 per hour, he had some lack of REM atonia.   I saw him on 05/31/2013, at which time I felt he was fairly stable. I kept him on Sinemet 4 times a day and gabapentin 100 mg 3 times a day. He reported, going up to 4 times a day with his Sinemet helped. He reported no side effects with gabapentin or Sinemet and seemed to tolerate them well. He had some residual facial tingling which was tolerable to him. He has LBP and went to [facility]. He had an Xray of the back and was told there was degenerative disease and was given and was given a 3 week taper of oral steroids, which helped. He had stopped exercising d/t back pain and was scheduled for PT evaluation through ortho. He has been on metamucil for constipation which helped.   I saw him on 11/30/2012, at which time I suggested that he continue gabapentin 100 mg 1 pill 3 times a day for his paresthesias and encouraged him to continue Sinemet with 25/100 mg strength one tablet 4 times a day, at 7, 11, 3 PM and 7 PM.     I first met him on 06/22/2012 and he presented on 08/23/2012 for a sooner than scheduled appointment because he felt his medication was not helpful. He previously followed with Dr. Morene Antu and had been complaining of paresthesias. Dr. Erling Cruz had started him on low-dose gabapentin. For his parkinsonism which was first noted in June of 2012 he was tried on pramipexole which helped, but at his first visit with me he told me that his gabapentin was not helpful and he also discontinued pramipexole a few months prior because of swelling in his feet and ankles. This improved after stopping both medications. At the time of his first visit with me I felt he had mild parkinsonism without  much in the way of lateralization. I started him on gabapentin again because of his paresthesias and also asked him to start low-dose  Sinemet. He called back stating that the Sinemet was not helpful. He requested a sooner appointment. He had been continuing taking gabapentin 100 mg 3 times a day and Sinemet one pill 3 times a day with minimal improvement as he reported last time. I suggested at that visit that he increase his Sinemet to one pill 4 times a day and continue with gabapentin 100 mg 3 times a day. I also felt that he may have right sided predominant idiopathic Parkinson's disease due to an intermittent tremor noted only on the right side.     His Past Medical History Is Significant For: Past Medical History:  Diagnosis Date  . Abnormal PFT 1. 05/18/08  2. 11/30/08   1. Showed mild airflow obstruction, mild restriction, mild diffusion defect; FEV1 2.22(64%), FVC 3.33(65%), FEVi% 67, TLC 5.19(69%), DLCO 77%, +BD  2. FEV1 2.38(73%), FVC 3.81(80%), FEV1% 63, TLC 5.61(80%), DLCO 79%, no BD  . Allergic rhinitis   . Anxiety   . BPH (benign prostatic hyperplasia)   . Cellulitis    right leg MRSA  . Chronic atrial fibrillation ([place])   . Erectile dysfunction   . GERD (gastroesophageal reflux disease)   . Heart murmur    mild MR by echo  . Hyperlipidemia   . Hypertension   . Osteoarthritis   . Paresthesias 06/22/2012  . Parkinsonism ([place]) 06/22/2012  . Restrictive lung disease     His Past Surgical History Is Significant For: Past Surgical History:  Procedure Laterality Date  . CARDIAC CATHETERIZATION  11/2009   normal coronary arteries  . CARDIOVERSION     multiple  . CATARACT EXTRACTION    . COLONOSCOPY    . TOTAL KNEE ARTHROPLASTY Left 11/30/2014   Procedure: TOTAL KNEE ARTHROPLASTY;  Surgeon: Susa Day, MD;  Location: [facility];  Service: Orthopedics;  Laterality: Left;  . TOTAL KNEE ARTHROPLASTY Right 07/12/2015   Procedure: RIGHT TOTAL KNEE ARTHROPLASTY;  Surgeon: Susa Day, MD;   Location: [facility];  Service: Orthopedics;  Laterality: Right;    His Family History Is Significant For: Family History  Problem Relation Age of Onset  . Diabetes Mother     DM  . Stroke Father     CVA  . Pancreatic cancer Brother   . Pancreatic cancer Other     Nephew    His Social History Is Significant For: Social History   Social History  . Marital status: Married    Spouse name: [name]  . Number of children: 4  . Years of education: masters   Occupational History  . Benefit analyst     Worked with [employer]   Social History Main Topics  . Smoking status: Never Smoker  . Smokeless tobacco: Never Used  . Alcohol use No  . Drug use: No  . Sexual activity: Not Asked   Other Topics Concern  . None   Social History Narrative   Married and lives in [place].  Retired Optometrist.   No reported caffeine use.     His Allergies Are:  Allergies  Allergen Reactions  . Lisinopril Cough  :   His Current Medications Are:  Outpatient Encounter Prescriptions as of 07/09/2016  Medication Sig  . carbidopa-levodopa (SINEMET CR) 50-200 MG tablet Take 1 tablet by mouth at bedtime.  . carbidopa-levodopa (SINEMET IR) 25-100 MG tablet Take 2 tablets by mouth 4 (four) times daily.  . cholecalciferol (VITAMIN D) 1000 units tablet Take 1,000 Units by mouth daily.  . clonazePAM (KLONOPIN) 0.5 MG disintegrating  tablet Take 1 tablet (0.5 mg total) by mouth at bedtime.  Marland Kitchen doxazosin (CARDURA) 2 MG tablet Take 2 mg by mouth at bedtime.   Marland Kitchen escitalopram (LEXAPRO) 10 MG tablet Take 1 tablet (10 mg total) by mouth at bedtime.  . famotidine (PEPCID) 40 MG tablet Take 40 mg by mouth daily.   . furosemide (LASIX) 40 MG tablet Take 40 mg by mouth daily.  Marland Kitchen gabapentin (NEURONTIN) 100 MG capsule Take 1 capsule (100 mg total) by mouth 3 (three) times daily.  . metoprolol succinate (TOPROL-XL) 25 MG 24 hr tablet Take 25 mg by mouth daily.  . Multiple Vitamin (MULTIVITAMIN WITH MINERALS) TABS  tablet Take 1 tablet by mouth daily.   . nebivolol (BYSTOLIC) 10 MG tablet Take 10 mg by mouth every evening.  . potassium chloride (K-DUR,KLOR-CON) 10 MEQ tablet Take 10 mEq by mouth daily.  . pravastatin (PRAVACHOL) 40 MG tablet Take 40 mg by mouth daily.   . valsartan (DIOVAN) 160 MG tablet Take 160 mg by mouth daily.   Marland Kitchen warfarin (COUMADIN) 5 MG tablet Take 1 or 1.5 tablets as directed by [facility]  . [DISCONTINUED] diphenhydramine-acetaminophen (TYLENOL PM) 25-500 MG TABS tablet Take 1-2 tablets by mouth at bedtime as needed (for sleep/pain).  . [DISCONTINUED] furosemide (LASIX) 20 MG tablet Take 20 mg by mouth daily.   . [DISCONTINUED] naproxen sodium (ANAPROX) 220 MG tablet Take 440 mg by mouth at bedtime as needed (for pain).  . [DISCONTINUED] psyllium (HYDROCIL/METAMUCIL) 95 % PACK Take 1 packet by mouth daily.  . [DISCONTINUED] tamsulosin (FLOMAX) 0.4 MG CAPS capsule Take 1 capsule (0.4 mg total) by mouth 2 (two) times daily.   No facility-administered encounter medications on file as of 07/09/2016.   :  Review of Systems:  Out of a complete 14 point review of systems, all are reviewed and negative with the exception of these symptoms as listed below: Review of Systems  Neurological:       Pt presents today to follow up on his PD. Pt denies any new concerns today.    Objective:  Neurologic Exam  Physical Exam Physical Examination:   Vitals:   07/09/16 0827  BP: 133/83  Pulse: 61   General Examination: The patient is a very pleasant 76 y.o. male in no acute distress. He appears well-developed and well-nourished and well groomed. Good spirits.   HEENT: Normocephalic, atraumatic, pupils are equal, round and reactive to light and accommodation. s/p cataract repairs. Extraocular trackingshows mild saccadic breakdown. He has mod hypophonia, no dysarthria noted. Oropharynx examination reveals no new findings. Tongue is central, palate symmetrical.   Chest: Clear to  auscultation without wheezing, rhonchi or crackles noted.  Heart: S1+S2+0, regular and normal without murmurs, rubs or gallops noted.   Abdomen: Soft, non-tender and non-distended with normal bowel sounds appreciated on auscultation.  Extremities: There is trace pitting edema in the distal lower extremities bilaterally. Pedal pulses are intact.  Skin: Warm and dry without trophic changes noted.  Musculoskeletal: exam reveals no obvious joint deformities, tenderness or joint swelling or erythema, healing scars from TKAs b/l.   Neurologically:  Mental status: The patient is awake, alert and oriented in all 4 spheres. His immediate and remote memory, attention, language skills and fund of knowledge are appropriate. There is no evidence of aphasia, agnosia, apraxia or anomia. Speech is clear with normal prosody and enunciation. Thought process is linear. Mood is normal and affect is normal.  Cranial nerves II - XII are as described  above under HEENT exam. In addition: shoulder shrug is normal with equal shoulder height noted. Motor exam: Normal bulk, strength and tone is noted. There is no drift, tremor or rebound. Romberg is negative. Reflexes are 1+ throughout. Fine motor skills and coordination: moderate impairment bilaterally, slightly worse on the left noted to Cerebellar testing: No dysmetria or intention tremor.  Sensory exam: intact to light touch in the upper and lower extremities.  Gait, station and balance: He stands with moderate difficulty, pushes himself up, posture moderately stooped, stable. Decreased pace and decrease stridelength, decreased armswing b/l. Balance in mild to moderately impaired.   Assessment and Plan:   In summary, BRAIDYN SCORSONE is a very pleasant 76 y.o.-year old male with a history of atrial fibrillation, hypertension, hyperlipidemia, who presents for followup consultation of his right-sided predominant Parkinson's disease, most likely akinetic-rigid type,  complicated by anxiety, mild depression, sleep disorder, including sleep fragmentation, nocturia, insomnia, obesity, arthritis, and RBD and recent status post left (2016), then right (2017) total knee replacement surgeries. He is doing reasonably well, walks without a cane. Stable exam. Had recent setback with hospitalization post fall and was found to have urinary retention, UTI, acute encephalopathy, all of which cleared and he was in inpatient rehabilitation as well. We had increased Sinemet to 2 pills 4 times a day, and he tolerates this and Sinemet has been effective, takes the CR at night. We increased the lexapro generic to 10 mg last time, in Dec. 2017. Will continue all other meds, refills up to date, will renew clonazepam Rx for his RBD.  FU in 6 months. I answered all their questions. Wife was on speaker phone.  I spent 25 minutes in total face-to-face time with the patient, more than 50% of which was spent in counseling and coordination of care, reviewing test results, reviewing medication and discussing or reviewing the diagnosis of PD its prognosis and treatment options. Pertinent laboratory and imaging test results that were available during this visit with the patient were reviewed by me and considered in my medical decision making (see chart for details).

## 2016-07-09 NOTE — Patient Instructions (Addendum)
Your exam is stable.  I think you're doing well.  Please continue all your meds.

## 2016-07-10 ENCOUNTER — Other Ambulatory Visit: Payer: Self-pay | Admitting: Gastroenterology

## 2016-07-22 DIAGNOSIS — R350 Frequency of micturition: Secondary | ICD-10-CM | POA: Diagnosis not present

## 2016-07-26 ENCOUNTER — Other Ambulatory Visit: Payer: Self-pay | Admitting: Cardiology

## 2016-07-28 ENCOUNTER — Ambulatory Visit (INDEPENDENT_AMBULATORY_CARE_PROVIDER_SITE_OTHER): Payer: Medicare Other | Admitting: *Deleted

## 2016-07-28 ENCOUNTER — Telehealth: Payer: Self-pay | Admitting: Pharmacist

## 2016-07-28 DIAGNOSIS — Z7901 Long term (current) use of anticoagulants: Secondary | ICD-10-CM

## 2016-07-28 DIAGNOSIS — Z5181 Encounter for therapeutic drug level monitoring: Secondary | ICD-10-CM

## 2016-07-28 DIAGNOSIS — I4891 Unspecified atrial fibrillation: Secondary | ICD-10-CM

## 2016-07-28 LAB — POCT INR: INR: 2.9

## 2016-07-28 NOTE — Telephone Encounter (Signed)
Received fax from [facility] for clearance for anticoagulation for colonoscopy on 09/02/16. Pt on warfarin for Afib with CHADS<4 and no history of stroke/TIA. Per protocol ok to hold 5 days prior to procedure. Resume evening of procedure or as directed by MD.   Faxed back to number provided. [phone]

## 2016-08-04 NOTE — Progress Notes (Signed)
This encounter was created in error - please disregard.

## 2016-08-06 ENCOUNTER — Encounter: Payer: Self-pay | Admitting: Podiatry

## 2016-08-06 ENCOUNTER — Ambulatory Visit (INDEPENDENT_AMBULATORY_CARE_PROVIDER_SITE_OTHER): Payer: Medicare Other | Admitting: Podiatry

## 2016-08-06 DIAGNOSIS — M79676 Pain in unspecified toe(s): Secondary | ICD-10-CM

## 2016-08-06 DIAGNOSIS — B351 Tinea unguium: Secondary | ICD-10-CM | POA: Diagnosis not present

## 2016-08-06 MED ORDER — AMMONIUM LACTATE 12 % EX CREA: TOPICAL_CREAM | CUTANEOUS | 0 refills | Status: DC | PRN

## 2016-08-06 NOTE — Progress Notes (Signed)
Patient ID: ASEEL UHDE, male   DOB: 10-Jul-1940, 76 y.o.   MRN: [id number] Complaint:  Visit Type: Patient returns to my office for continued preventative foot care services. Complaint: Patient states" my nails have grown long and thick and become painful to walk and wear shoes" . The patient presents for preventative foot care services. No changes to ROS  Podiatric Exam: Vascular: dorsalis pedis and posterior tibial pulses are palpable bilateral. Capillary return is immediate. Temperature gradient is WNL. Skin turgor WNL  Sensorium: Normal Semmes Weinstein monofilament test. Normal tactile sensation bilaterally. Nail Exam: Pt has thick disfigured discolored nails with subungual debris noted bilateral entire nail hallux through fifth toenails Ulcer Exam: There is no evidence of ulcer or pre-ulcerative changes or infection. Orthopedic Exam: Muscle tone and strength are WNL. No limitations in general ROM. No crepitus or effusions noted. Foot type and digits show no abnormalities. Bony prominences are unremarkable. Skin: No Porokeratosis. No infection or ulcers  Diagnosis:  Onychomycosis, , Pain in right toe, pain in left toes,    Treatment & Plan Procedures and Treatment: Consent by patient was obtained for treatment procedures. The patient understood the discussion of treatment and procedures well. All questions were answered thoroughly reviewed. Debridement of mycotic and hypertrophic toenails, 1 through 5 bilateral and clearing of subungual debris. No ulceration, no infection noted. Discussed heel callus.  Prescribe  Lac-Hydrin.  Heel cushions dispensed. Return Visit-Office Procedure: Patient instructed to return to the office for a follow up visit 10 weeks  for continued evaluation and treatment.   Gardiner Barefoot DPM

## 2016-08-12 DIAGNOSIS — R350 Frequency of micturition: Secondary | ICD-10-CM | POA: Diagnosis not present

## 2016-08-25 ENCOUNTER — Ambulatory Visit (INDEPENDENT_AMBULATORY_CARE_PROVIDER_SITE_OTHER): Payer: Medicare Other | Admitting: *Deleted

## 2016-08-25 DIAGNOSIS — Z7901 Long term (current) use of anticoagulants: Secondary | ICD-10-CM | POA: Diagnosis not present

## 2016-08-25 DIAGNOSIS — I4891 Unspecified atrial fibrillation: Secondary | ICD-10-CM

## 2016-08-25 DIAGNOSIS — Z5181 Encounter for therapeutic drug level monitoring: Secondary | ICD-10-CM

## 2016-08-25 LAB — POCT INR: INR: 5

## 2016-08-25 NOTE — Patient Instructions (Addendum)
08/25/16-Do not take any Coumadin   08/26/16 Do not take any Coumadin   08/27/16-Take regular dose of Coumadin -1.5 tablets  08/28/16-Do not take anymore Coumadin until after your procedure on 09/02/16  08/29/16-NO COUMADIN  08/30/16-NO COUMADIN  08/31/16-NO COUMADIN  09/01/16-NO COUAMDIN  09/02/16-RESUME COUMADIN WHEN YOUR GI DOCTOR STATES YOU CAN-USUALLY THAT NIGHT, ONCE YOU RESUME TAKE AN EXTRA 1/2 TABLET ALONG WITH REGULAR DOSE FOR TWO DAYS  09/03/16-TAKE AN EXTRA 1/2 TABLET ALONG WITH REGULAR DOSE   09/04/16-TAKE YOUR REGULAR DOSE OF COUMADIN UNTIL YOUR APPT WITH COUMADIN CLINIC  09/05/16-TAKE YOUR REGULAR DOSE OF COUMADIN UNTIL YOUR APPT WITH COUMADIN CLINIC  09/06/16-TAKE YOUR REGULAR DOSE OF COUMADIN UNTIL YOUR APPT WITH COUMADIN CLINIC  09/07/16-TAKE YOUR REGULAR DOSE OF COUMADIN UNTIL YOUR APPT WITH COUMADIN CLINIC  09/08/16-TAKE YOUR REGULAR DOSE OF COUMADIN UNTIL YOUR APPT WITH COUMADIN CLINIC  09/09/16-REPORT TO COUMADIN CLINIC APPT AT 1045AM

## 2016-09-01 ENCOUNTER — Encounter (HOSPITAL_COMMUNITY): Payer: Self-pay | Admitting: *Deleted

## 2016-09-02 ENCOUNTER — Encounter (HOSPITAL_COMMUNITY): Payer: Self-pay | Admitting: *Deleted

## 2016-09-02 ENCOUNTER — Ambulatory Visit (HOSPITAL_COMMUNITY)
Admission: RE | Admit: 2016-09-02 | Discharge: 2016-09-02 | Disposition: A | Discharge status: Home / Self Care | Payer: Medicare Other | Source: Ambulatory Visit | Attending: Gastroenterology | Admitting: Gastroenterology

## 2016-09-02 ENCOUNTER — Encounter (HOSPITAL_COMMUNITY)
Admission: RE | Disposition: A | Discharge status: Home / Self Care | Payer: Self-pay | Source: Ambulatory Visit | Attending: Gastroenterology

## 2016-09-02 ENCOUNTER — Ambulatory Visit (HOSPITAL_COMMUNITY): Payer: Medicare Other | Admitting: Anesthesiology

## 2016-09-02 DIAGNOSIS — Z86010 Personal history of colonic polyps: Secondary | ICD-10-CM | POA: Insufficient documentation

## 2016-09-02 DIAGNOSIS — Z7901 Long term (current) use of anticoagulants: Secondary | ICD-10-CM | POA: Diagnosis not present

## 2016-09-02 DIAGNOSIS — I10 Essential (primary) hypertension: Secondary | ICD-10-CM | POA: Insufficient documentation

## 2016-09-02 DIAGNOSIS — G20 Parkinson's disease: Secondary | ICD-10-CM | POA: Insufficient documentation

## 2016-09-02 DIAGNOSIS — Z96652 Presence of left artificial knee joint: Secondary | ICD-10-CM | POA: Diagnosis not present

## 2016-09-02 DIAGNOSIS — N400 Enlarged prostate without lower urinary tract symptoms: Secondary | ICD-10-CM | POA: Insufficient documentation

## 2016-09-02 DIAGNOSIS — I4891 Unspecified atrial fibrillation: Secondary | ICD-10-CM | POA: Diagnosis not present

## 2016-09-02 DIAGNOSIS — Z1211 Encounter for screening for malignant neoplasm of colon: Secondary | ICD-10-CM | POA: Diagnosis not present

## 2016-09-02 DIAGNOSIS — E785 Hyperlipidemia, unspecified: Secondary | ICD-10-CM | POA: Diagnosis not present

## 2016-09-02 DIAGNOSIS — I482 Chronic atrial fibrillation: Secondary | ICD-10-CM | POA: Insufficient documentation

## 2016-09-02 DIAGNOSIS — E7800 Pure hypercholesterolemia, unspecified: Secondary | ICD-10-CM | POA: Insufficient documentation

## 2016-09-02 HISTORY — PX: COLONOSCOPY WITH PROPOFOL: SHX5780

## 2016-09-02 LAB — PROTIME-INR
INR: 1.49
PROTHROMBIN TIME: 18.1 s — AB (ref 11.4–15.2)

## 2016-09-02 SURGERY — COLONOSCOPY WITH PROPOFOL
Anesthesia: Monitor Anesthesia Care

## 2016-09-02 MED ORDER — LACTATED RINGERS IV SOLN
INTRAVENOUS | Status: DC
Start: 2016-09-02 — End: 2016-09-02
  Administered 2016-09-02: 12:00:00 via INTRAVENOUS

## 2016-09-02 MED ORDER — PROPOFOL 10 MG/ML IV BOLUS
INTRAVENOUS | Status: AC
Start: 2016-09-02 — End: 2016-09-02
  Filled 2016-09-02: qty 40

## 2016-09-02 MED ORDER — PROPOFOL 10 MG/ML IV BOLUS
INTRAVENOUS | Status: DC | PRN
  Administered 2016-09-02: 20 mg via INTRAVENOUS

## 2016-09-02 MED ORDER — PROPOFOL 500 MG/50ML IV EMUL
INTRAVENOUS | Status: DC | PRN
  Administered 2016-09-02: 200 ug/kg/min via INTRAVENOUS

## 2016-09-02 MED ORDER — ONDANSETRON HCL 4 MG/2ML IJ SOLN
INTRAMUSCULAR | Status: DC | PRN
  Administered 2016-09-02: 4 mg via INTRAVENOUS

## 2016-09-02 MED ORDER — PROPOFOL 10 MG/ML IV BOLUS
INTRAVENOUS | Status: AC
  Filled 2016-09-02: qty 20

## 2016-09-02 MED ORDER — ONDANSETRON HCL 4 MG/2ML IJ SOLN
INTRAMUSCULAR | Status: AC
  Filled 2016-09-02: qty 2

## 2016-09-02 MED ORDER — PROPOFOL 10 MG/ML IV BOLUS
INTRAVENOUS | Status: AC
Start: 2016-09-02 — End: 2016-09-02
  Filled 2016-09-02: qty 20

## 2016-09-02 MED ORDER — SODIUM CHLORIDE 0.9 % IV SOLN: INTRAVENOUS | Status: DC

## 2016-09-02 SURGICAL SUPPLY — 21 items

## 2016-09-02 NOTE — Op Note (Signed)
[facility] Patient Name: Brian [name] Procedure Date: 09/02/2016 MRN: [id number] Attending MD: Garlan Fair , MD Date of Birth: 1940/06/25 CSN: [id number] Age: 76 Admit Type: Outpatient Procedure:                Colonoscopy Indications:              High risk colon cancer surveillance: Personal                            history of non-advanced adenoma Providers:                Garlan Fair, MD, Burtis Junes, RN, Tinnie Gens,                            Technician Referring MD:              Medicines:                Propofol per Anesthesia Complications:            No immediate complications. Estimated Blood Loss:     Estimated blood loss: none. Procedure:                Pre-Anesthesia Assessment:                           - Prior to the procedure, a History and Physical                            was performed, and patient medications and                            allergies were reviewed. The patient's tolerance of                            previous anesthesia was also reviewed. The risks                            and benefits of the procedure and the sedation                            options and risks were discussed with the patient.                            All questions were answered, and informed consent                            was obtained. Prior Anticoagulants: The patient has                            taken Coumadin (warfarin), last dose was 5 days                            prior to procedure. ASA Grade Assessment: III - A  patient with severe systemic disease. After                            reviewing the risks and benefits, the patient was                            deemed in satisfactory condition to undergo the                            procedure.                           After obtaining informed consent, the colonoscope                            was passed under direct vision. Throughout the             procedure, the patient's blood pressure, pulse, and                            oxygen saturations were monitored continuously. The                            EC-[id number] ([id number]) scope was introduced through                            the anus and advanced to the the cecum, identified                            by appendiceal orifice and ileocecal valve. The                            EC-[id number] ([id number]) scope was introduced through                            the and advanced to the. The colonoscopy was                            technically difficult and complex due to                            significant looping. The patient tolerated the                            procedure well. The quality of the bowel                            preparation was good. The terminal ileum, the                            ileocecal valve, the appendiceal orifice and the                            rectum were photographed. Scope In: 1:01:26 PM Scope Out: 1:42:20 PM Scope Withdrawal  Time: 0 hours 11 minutes 57 seconds  Total Procedure Duration: 0 hours 40 minutes 54 seconds  Findings:      The perianal and digital rectal examinations were normal.      The entire examined colon appeared normal. Impression:               - The entire examined colon is normal.                           - No specimens collected. Moderate Sedation:      N/A- Per Anesthesia Care Recommendation:           - Patient has a contact number available for                            emergencies. The signs and symptoms of potential                            delayed complications were discussed with the                            patient. Return to normal activities tomorrow.                            Written discharge instructions were provided to the                            patient.                           - Repeat colonoscopy is not recommended for                            surveillance.                            - Resume previous diet.                           - Continue present medications. Procedure Code(s):        --- Professional ---                           B6389, Colorectal cancer screening; colonoscopy on                            individual at high risk Diagnosis Code(s):        --- Professional ---                           Z86.010, Personal history of colonic polyps CPT copyright 2016 American [employer]. All rights reserved. The codes documented in this report are preliminary and upon coder review may  be revised to meet current compliance requirements. Earle Gell, MD Garlan Fair, MD 09/02/2016 1:49:14 PM This report has been signed electronically. Number of Addenda: 0

## 2016-09-02 NOTE — Discharge Instructions (Signed)

## 2016-09-02 NOTE — Transfer of Care (Signed)
Immediate Anesthesia Transfer of Care Note  Patient: Brian [name]  Procedure(s) Performed: Procedure(s): COLONOSCOPY WITH PROPOFOL (N/A)  Patient Location: PACU  Anesthesia Type:MAC  Level of Consciousness:  sedated, patient cooperative and responds to stimulation  Airway & Oxygen Therapy:Patient Spontanous Breathing and Patient connected to face mask oxgen  Post-op Assessment:  Report given to PACU RN and Post -op Vital signs reviewed and stable  Post vital signs:  Reviewed and stable  Last Vitals:  Vitals:   09/02/16 1120 09/02/16 1348  BP: 140/79   Pulse: 78 69  Resp: 16 11  Temp: 82.7 C     Complications: No apparent anesthesia complications

## 2016-09-02 NOTE — Anesthesia Preprocedure Evaluation (Signed)
Anesthesia Evaluation  Patient identified by MRN, date of birth, ID band Patient awake    Reviewed: Allergy & Precautions, NPO status , Patient's Chart, lab work & pertinent test results  Airway Mallampati: II  TM Distance: >3 FB Neck ROM: Full    Dental no notable dental hx.    Pulmonary neg pulmonary ROS,    Pulmonary exam normal breath sounds clear to auscultation       Cardiovascular hypertension, Normal cardiovascular exam+ dysrhythmias Atrial Fibrillation  Rhythm:Regular Rate:Normal     Neuro/Psych parkinsons dz negative psych ROS   GI/Hepatic negative GI ROS, Neg liver ROS,   Endo/Other  negative endocrine ROS  Renal/GU Renal disease  negative genitourinary   Musculoskeletal negative musculoskeletal ROS (+)   Abdominal   Peds negative pediatric ROS (+)  Hematology negative hematology ROS (+)   Anesthesia Other Findings   Reproductive/Obstetrics negative OB ROS                             Anesthesia Physical Anesthesia Plan  ASA: III  Anesthesia Plan: MAC   Post-op Pain Management:    Induction: Intravenous  PONV Risk Score and Plan: 0  Airway Management Planned: Nasal Cannula  Additional Equipment:   Intra-op Plan:   Post-operative Plan:   Informed Consent: I have reviewed the patients History and Physical, chart, labs and discussed the procedure including the risks, benefits and alternatives for the proposed anesthesia with the patient or authorized representative who has indicated his/her understanding and acceptance.   Dental advisory given  Plan Discussed with: CRNA and Surgeon  Anesthesia Plan Comments:         Anesthesia Quick Evaluation

## 2016-09-02 NOTE — H&P (Signed)
Procedure: Surveillance colonoscopy. 12/25/2011 colonoscopy was performed with removal of a 3 mm transverse colon tubular adenomatous polyp. Chronic Coumadin therapy to treat atrial fibrillation.  History: The patient is a 76 year old male born [birth year] [place]. He has chronic Parkinson's disease and permanent atrial fibrillation. He is scheduled to undergo surveillance colonoscopy today. He stop taking Coumadin 5 days ago. His prothrombin time INR is 1.49.  Past medical history: Parkinson's disease. Hypertension. Normal coronary arteries by cardiac catheterization in September 2011. Hypercholesterolemia. Benign prostatic hypertrophy. Restrictive lung disease by pulmonary function testing. Permanent atrial fibrillation. Allergic rhinitis. Diastolic dysfunction. Bilateral cataract surgery. Cardioversion performed to treat atrial fibrillation. Left total knee replacement surgery performed in 2016.  Medication allergies: Lisinopril caused coughing. Protonix caused worsening tremors. Prilosec caused facial swelling.  Exam: The patient is alert and lying comfortably on the endoscopy stretcher. Cardiac exam reveals an irregular rhythm consistent with his diagnosis permanent atrial fibrillation. Lungs are clear to auscultation. Abdomen is soft and nontender to palpation.  Plan: Proceed with surveillance colonoscopy

## 2016-09-03 ENCOUNTER — Encounter (HOSPITAL_COMMUNITY): Payer: Self-pay | Admitting: Gastroenterology

## 2016-09-03 NOTE — Anesthesia Postprocedure Evaluation (Signed)
Anesthesia Post Note  Patient: Brian [name]  Procedure(s) Performed: Procedure(s) (LRB): COLONOSCOPY WITH PROPOFOL (N/A)     Patient location during evaluation: PACU Anesthesia Type: MAC Level of consciousness: awake and alert Pain management: pain level controlled Vital Signs Assessment: post-procedure vital signs reviewed and stable Respiratory status: spontaneous breathing, nonlabored ventilation, respiratory function stable and patient connected to nasal cannula oxygen Cardiovascular status: stable and blood pressure returned to baseline Anesthetic complications: no    Last Vitals:  Vitals:   09/02/16 1400 09/02/16 1410  BP: 118/63 116/69  Pulse: (!) 55 69  Resp: 18 17  Temp:      Last Pain:  Vitals:   09/02/16 1120  TempSrc: Oral                 [name] S

## 2016-09-04 DIAGNOSIS — R350 Frequency of micturition: Secondary | ICD-10-CM | POA: Diagnosis not present

## 2016-09-09 ENCOUNTER — Ambulatory Visit (INDEPENDENT_AMBULATORY_CARE_PROVIDER_SITE_OTHER): Payer: Medicare Other | Admitting: Pharmacist

## 2016-09-09 DIAGNOSIS — Z7901 Long term (current) use of anticoagulants: Secondary | ICD-10-CM

## 2016-09-09 DIAGNOSIS — Z5181 Encounter for therapeutic drug level monitoring: Secondary | ICD-10-CM

## 2016-09-09 DIAGNOSIS — I4891 Unspecified atrial fibrillation: Secondary | ICD-10-CM

## 2016-09-09 LAB — POCT INR: INR: 1.9

## 2016-09-25 ENCOUNTER — Ambulatory Visit (INDEPENDENT_AMBULATORY_CARE_PROVIDER_SITE_OTHER): Payer: Medicare Other | Admitting: *Deleted

## 2016-09-25 DIAGNOSIS — Z5181 Encounter for therapeutic drug level monitoring: Secondary | ICD-10-CM | POA: Diagnosis not present

## 2016-09-25 DIAGNOSIS — Z7901 Long term (current) use of anticoagulants: Secondary | ICD-10-CM

## 2016-09-25 DIAGNOSIS — I4891 Unspecified atrial fibrillation: Secondary | ICD-10-CM | POA: Diagnosis not present

## 2016-09-25 LAB — POCT INR: INR: 2.2

## 2016-09-30 DIAGNOSIS — R350 Frequency of micturition: Secondary | ICD-10-CM | POA: Diagnosis not present

## 2016-10-03 DIAGNOSIS — Z96651 Presence of right artificial knee joint: Secondary | ICD-10-CM | POA: Diagnosis not present

## 2016-10-06 DIAGNOSIS — Z961 Presence of intraocular lens: Secondary | ICD-10-CM | POA: Diagnosis not present

## 2016-10-14 DIAGNOSIS — N401 Enlarged prostate with lower urinary tract symptoms: Secondary | ICD-10-CM | POA: Diagnosis not present

## 2016-10-14 DIAGNOSIS — R350 Frequency of micturition: Secondary | ICD-10-CM | POA: Diagnosis not present

## 2016-10-16 DIAGNOSIS — M25661 Stiffness of right knee, not elsewhere classified: Secondary | ICD-10-CM | POA: Diagnosis not present

## 2016-10-17 DIAGNOSIS — I10 Essential (primary) hypertension: Secondary | ICD-10-CM | POA: Diagnosis not present

## 2016-10-17 DIAGNOSIS — D696 Thrombocytopenia, unspecified: Secondary | ICD-10-CM | POA: Diagnosis not present

## 2016-10-17 DIAGNOSIS — I519 Heart disease, unspecified: Secondary | ICD-10-CM | POA: Diagnosis not present

## 2016-10-17 DIAGNOSIS — G20 Parkinson's disease: Secondary | ICD-10-CM | POA: Diagnosis not present

## 2016-10-17 DIAGNOSIS — E782 Mixed hyperlipidemia: Secondary | ICD-10-CM | POA: Diagnosis not present

## 2016-10-17 DIAGNOSIS — I482 Chronic atrial fibrillation: Secondary | ICD-10-CM | POA: Diagnosis not present

## 2016-10-17 DIAGNOSIS — N182 Chronic kidney disease, stage 2 (mild): Secondary | ICD-10-CM | POA: Diagnosis not present

## 2016-10-21 DIAGNOSIS — R350 Frequency of micturition: Secondary | ICD-10-CM | POA: Diagnosis not present

## 2016-10-21 DIAGNOSIS — M25661 Stiffness of right knee, not elsewhere classified: Secondary | ICD-10-CM | POA: Diagnosis not present

## 2016-10-23 ENCOUNTER — Ambulatory Visit (INDEPENDENT_AMBULATORY_CARE_PROVIDER_SITE_OTHER): Payer: Medicare Other | Admitting: *Deleted

## 2016-10-23 DIAGNOSIS — Z7901 Long term (current) use of anticoagulants: Secondary | ICD-10-CM

## 2016-10-23 DIAGNOSIS — M25661 Stiffness of right knee, not elsewhere classified: Secondary | ICD-10-CM | POA: Diagnosis not present

## 2016-10-23 DIAGNOSIS — Z5181 Encounter for therapeutic drug level monitoring: Secondary | ICD-10-CM | POA: Diagnosis not present

## 2016-10-23 DIAGNOSIS — I4891 Unspecified atrial fibrillation: Secondary | ICD-10-CM

## 2016-10-23 LAB — POCT INR: INR: 2.6

## 2016-10-28 DIAGNOSIS — M25661 Stiffness of right knee, not elsewhere classified: Secondary | ICD-10-CM | POA: Diagnosis not present

## 2016-10-29 ENCOUNTER — Telehealth: Payer: Self-pay

## 2016-10-29 NOTE — Telephone Encounter (Signed)
I called pt's wife, per DPR. I advised her that Dr. Rexene Alberts can fill out FMLA for pt's wife only the days that pt has appointments with her. She cannot fill out FMLA for pt's PT days, per pt's wife request, and this should be discussed with the ortho MD that ordered the Pt. Pt's wife is agreeable to this. Form completed and sent to MR for processing.

## 2016-10-30 DIAGNOSIS — Z0289 Encounter for other administrative examinations: Secondary | ICD-10-CM

## 2016-10-30 NOTE — Telephone Encounter (Signed)
Advised patients wife Czar Ysaguirre [facility] paperwork is completed and ready for pick up and to pay the $50 fee at time of pick up.

## 2016-10-30 NOTE — Telephone Encounter (Signed)
$  50 fee has been paid.

## 2016-11-04 DIAGNOSIS — M25661 Stiffness of right knee, not elsewhere classified: Secondary | ICD-10-CM | POA: Diagnosis not present

## 2016-11-05 ENCOUNTER — Encounter: Payer: Self-pay | Admitting: Podiatry

## 2016-11-05 ENCOUNTER — Ambulatory Visit (INDEPENDENT_AMBULATORY_CARE_PROVIDER_SITE_OTHER): Payer: Medicare Other | Admitting: Podiatry

## 2016-11-05 DIAGNOSIS — Z96651 Presence of right artificial knee joint: Secondary | ICD-10-CM | POA: Diagnosis not present

## 2016-11-05 DIAGNOSIS — L84 Corns and callosities: Secondary | ICD-10-CM

## 2016-11-05 DIAGNOSIS — B351 Tinea unguium: Secondary | ICD-10-CM

## 2016-11-05 DIAGNOSIS — M79676 Pain in unspecified toe(s): Secondary | ICD-10-CM

## 2016-11-05 DIAGNOSIS — Z96652 Presence of left artificial knee joint: Secondary | ICD-10-CM | POA: Diagnosis not present

## 2016-11-05 MED ORDER — AMMONIUM LACTATE 12 % EX CREA
TOPICAL_CREAM | CUTANEOUS | 3 refills | Status: DC | PRN
Start: 2016-11-05 — End: 2019-09-13

## 2016-11-05 MED ORDER — AMMONIUM LACTATE 12 % EX CREA: TOPICAL_CREAM | CUTANEOUS | 0 refills | Status: DC | PRN

## 2016-11-05 NOTE — Addendum Note (Signed)
Addended byDeidre Ala, Perrin Gens L on: 11/05/2016 11:18 AM   Modules accepted: Orders

## 2016-11-05 NOTE — Progress Notes (Signed)
Patient ID: Brian [name], male   DOB: 1940-10-21, 77 y.o.   MRN: [id number] Complaint:  Visit Type: Patient returns to my office for continued preventative foot care services. Complaint: Patient states" my nails have grown long and thick and become painful to walk and wear shoes" . The patient presents for preventative foot care services. No changes to ROS.  Patient says his heel was better applying medicine but he needs a refill.  Patient has worn pads but he cannot keep them on for sleep.  Podiatric Exam: Vascular: dorsalis pedis and posterior tibial pulses are palpable bilateral. Capillary return is immediate. Temperature gradient is WNL. Skin turgor WNL  Sensorium: Normal Semmes Weinstein monofilament test. Normal tactile sensation bilaterally. Nail Exam: Pt has thick disfigured discolored nails with subungual debris noted bilateral entire nail hallux through fifth toenails Ulcer Exam: There is no evidence of ulcer or pre-ulcerative changes or infection. Orthopedic Exam: Muscle tone and strength are WNL. No limitations in general ROM. No crepitus or effusions noted. Foot type and digits show no abnormalities. Bony prominences are unremarkable. Skin: No Porokeratosis. No infection or ulcers  Diagnosis:  Onychomycosis, , Pain in right toe, pain in left toes,    Treatment & Plan Procedures and Treatment: Consent by patient was obtained for treatment procedures. The patient understood the discussion of treatment and procedures well. All questions were answered thoroughly reviewed. Debridement of mycotic and hypertrophic toenails, 1 through 5 bilateral and clearing of subungual debris. No ulceration, no infection noted. Discussed heel callus.  Prescribe  Lac-Hydrin.  Heel cushions dispensed. Return Visit-Office Procedure: Patient instructed to return to the office for a follow up visit 10 weeks  for continued evaluation and treatment.   Gardiner Barefoot DPM

## 2016-11-05 NOTE — Addendum Note (Signed)
Addended byDeidre Ala, Maryalyce Sanjuan L on: 11/05/2016 09:31 AM   Modules accepted: Orders

## 2016-11-06 DIAGNOSIS — M25661 Stiffness of right knee, not elsewhere classified: Secondary | ICD-10-CM | POA: Diagnosis not present

## 2016-11-07 ENCOUNTER — Ambulatory Visit: Payer: Medicare Other | Admitting: Podiatry

## 2016-11-11 DIAGNOSIS — M25661 Stiffness of right knee, not elsewhere classified: Secondary | ICD-10-CM | POA: Diagnosis not present

## 2016-11-13 DIAGNOSIS — E782 Mixed hyperlipidemia: Secondary | ICD-10-CM | POA: Diagnosis not present

## 2016-11-13 DIAGNOSIS — M25661 Stiffness of right knee, not elsewhere classified: Secondary | ICD-10-CM | POA: Diagnosis not present

## 2016-11-13 DIAGNOSIS — I482 Chronic atrial fibrillation: Secondary | ICD-10-CM | POA: Diagnosis not present

## 2016-11-13 DIAGNOSIS — R350 Frequency of micturition: Secondary | ICD-10-CM | POA: Diagnosis not present

## 2016-11-13 DIAGNOSIS — N400 Enlarged prostate without lower urinary tract symptoms: Secondary | ICD-10-CM | POA: Diagnosis not present

## 2016-11-13 DIAGNOSIS — M1990 Unspecified osteoarthritis, unspecified site: Secondary | ICD-10-CM | POA: Diagnosis not present

## 2016-11-13 DIAGNOSIS — N182 Chronic kidney disease, stage 2 (mild): Secondary | ICD-10-CM | POA: Diagnosis not present

## 2016-11-13 DIAGNOSIS — I10 Essential (primary) hypertension: Secondary | ICD-10-CM | POA: Diagnosis not present

## 2016-11-18 ENCOUNTER — Other Ambulatory Visit: Payer: Self-pay | Admitting: Neurology

## 2016-11-18 DIAGNOSIS — M25661 Stiffness of right knee, not elsewhere classified: Secondary | ICD-10-CM | POA: Diagnosis not present

## 2016-11-18 DIAGNOSIS — G4752 REM sleep behavior disorder: Secondary | ICD-10-CM

## 2016-11-20 ENCOUNTER — Ambulatory Visit (INDEPENDENT_AMBULATORY_CARE_PROVIDER_SITE_OTHER): Payer: Medicare Other

## 2016-11-20 DIAGNOSIS — Z7901 Long term (current) use of anticoagulants: Secondary | ICD-10-CM | POA: Diagnosis not present

## 2016-11-20 DIAGNOSIS — Z5181 Encounter for therapeutic drug level monitoring: Secondary | ICD-10-CM | POA: Diagnosis not present

## 2016-11-20 DIAGNOSIS — M25661 Stiffness of right knee, not elsewhere classified: Secondary | ICD-10-CM | POA: Diagnosis not present

## 2016-11-20 DIAGNOSIS — I4891 Unspecified atrial fibrillation: Secondary | ICD-10-CM

## 2016-11-20 LAB — POCT INR: INR: 2.7

## 2016-11-27 DIAGNOSIS — M25661 Stiffness of right knee, not elsewhere classified: Secondary | ICD-10-CM | POA: Diagnosis not present

## 2016-12-09 DIAGNOSIS — R350 Frequency of micturition: Secondary | ICD-10-CM | POA: Diagnosis not present

## 2016-12-23 ENCOUNTER — Ambulatory Visit (INDEPENDENT_AMBULATORY_CARE_PROVIDER_SITE_OTHER): Payer: Medicare Other | Admitting: *Deleted

## 2016-12-23 ENCOUNTER — Ambulatory Visit (INDEPENDENT_AMBULATORY_CARE_PROVIDER_SITE_OTHER): Payer: Medicare Other | Admitting: Cardiology

## 2016-12-23 ENCOUNTER — Encounter: Payer: Self-pay | Admitting: Cardiology

## 2016-12-23 VITALS — BP 118/68 | HR 89 | Ht 73.0 in | Wt 223.0 lb

## 2016-12-23 DIAGNOSIS — Z5181 Encounter for therapeutic drug level monitoring: Secondary | ICD-10-CM

## 2016-12-23 DIAGNOSIS — I4891 Unspecified atrial fibrillation: Secondary | ICD-10-CM

## 2016-12-23 DIAGNOSIS — I10 Essential (primary) hypertension: Secondary | ICD-10-CM

## 2016-12-23 DIAGNOSIS — I482 Chronic atrial fibrillation: Secondary | ICD-10-CM | POA: Diagnosis not present

## 2016-12-23 DIAGNOSIS — R600 Localized edema: Secondary | ICD-10-CM | POA: Diagnosis not present

## 2016-12-23 DIAGNOSIS — Z7901 Long term (current) use of anticoagulants: Secondary | ICD-10-CM | POA: Diagnosis not present

## 2016-12-23 DIAGNOSIS — I4821 Permanent atrial fibrillation: Secondary | ICD-10-CM

## 2016-12-23 LAB — POCT INR: INR: 2.5

## 2016-12-23 NOTE — Progress Notes (Signed)
Cardiology Office Note:    Date:  12/23/2016   ID:  Brian [name], DOB 15-Nov-1940, MRN [id number]  PCP:  Wenda Low, MD  Cardiologist:  Fransico Him, MD   Referring MD: Wenda Low, MD   Chief Complaint  Patient presents with  . Atrial Fibrillation  . Hypertension    History of Present Illness:    Brian [name] is a 76 y.o. male with a hx of permanent atrial fibrillation on chronic systemic anticoagulation, chronic LE edema and HTN.  He also has Parkinson's Disease which has significantly progressed since I saw him last and he is walking very slowly with a cane.  He is here today for followup and is doing well from a cardiac standpoint but has progressively declined from a neurological standpoint.  He denies any chest pain or pressure, SOB, DOE, PND, orthopnea, LE edema, dizziness, palpitations or syncope. He is compliant with his meds and is tolerating meds with no SE.    Past Medical History:  Diagnosis Date  . Abnormal PFT 1. 05/18/08  2. 11/30/08   1. Showed mild airflow obstruction, mild restriction, mild diffusion defect; FEV1 2.22(64%), FVC 3.33(65%), FEVi% 67, TLC 5.19(69%), DLCO 77%, +BD  2. FEV1 2.38(73%), FVC 3.81(80%), FEV1% 63, TLC 5.61(80%), DLCO 79%, no BD  . Allergic rhinitis   . Anxiety   . BPH (benign prostatic hyperplasia)   . Cellulitis    right leg MRSA  . Erectile dysfunction   . GERD (gastroesophageal reflux disease)   . Heart murmur    mild MR by echo  . Hyperlipidemia   . Hypertension   . Osteoarthritis   . Paresthesias 06/22/2012  . Parkinsonism ([place]) 06/22/2012  . Permanent atrial fibrillation ([place])   . Restrictive lung disease     Past Surgical History:  Procedure Laterality Date  . CARDIAC CATHETERIZATION  11/2009   normal coronary arteries  . CARDIOVERSION     multiple  . CATARACT EXTRACTION    . COLONOSCOPY    . COLONOSCOPY WITH PROPOFOL N/A 09/02/2016   Procedure: COLONOSCOPY WITH PROPOFOL;  Surgeon: Garlan Fair, MD;  Location: [facility]  ENDOSCOPY;  Service: Endoscopy;  Laterality: N/A;  . EYE SURGERY     bilateral cataract extraction  . JOINT REPLACEMENT    . TOTAL KNEE ARTHROPLASTY Left 11/30/2014   Procedure: TOTAL KNEE ARTHROPLASTY;  Surgeon: Susa Day, MD;  Location: [facility] ORS;  Service: Orthopedics;  Laterality: Left;  . TOTAL KNEE ARTHROPLASTY Right 07/12/2015   Procedure: RIGHT TOTAL KNEE ARTHROPLASTY;  Surgeon: Susa Day, MD;  Location: [facility] ORS;  Service: Orthopedics;  Laterality: Right;    Current Medications: Current Meds  Medication Sig  . ammonium lactate (LAC-HYDRIN) 12 % cream Apply topically as needed for dry skin. As prescribed  . carbidopa-levodopa (SINEMET CR) 50-200 MG tablet Take 1 tablet by mouth at bedtime.  . carbidopa-levodopa (SINEMET IR) 25-100 MG tablet Take 2 tablets by mouth 4 (four) times daily.  . cholecalciferol (VITAMIN D) 1000 units tablet Take 1,000 Units by mouth daily. 7 am  . clonazePAM (KLONOPIN) 0.5 MG disintegrating tablet Take 1 tablet (0.5 mg total) by mouth at bedtime.  Marland Kitchen doxazosin (CARDURA) 2 MG tablet Take 2 mg by mouth at bedtime.   Marland Kitchen escitalopram (LEXAPRO) 10 MG tablet Take 1 tablet (10 mg total) by mouth at bedtime.  . famotidine (PEPCID) 40 MG tablet Take 40 mg by mouth 2 (two) times daily. 7am 7 pm  . furosemide (LASIX) 20 MG tablet Take 20  mg by mouth 2 (two) times daily. 7 am, 7 pm  . gabapentin (NEURONTIN) 100 MG capsule Take 1 capsule (100 mg total) by mouth 3 (three) times daily.  Marland Kitchen losartan (COZAAR) 50 MG tablet Take 50 mg by mouth daily.  . metoprolol succinate (TOPROL-XL) 25 MG 24 hr tablet Take 25 mg by mouth every evening. 7 pm  . Multiple Vitamin (MULTIVITAMIN WITH MINERALS) TABS tablet Take 1 tablet by mouth daily. 7 am  . potassium chloride (K-DUR,KLOR-CON) 10 MEQ tablet Take 10 mEq by mouth daily. 7 am  . pravastatin (PRAVACHOL) 40 MG tablet Take 40 mg by mouth daily.   . sodium chloride (OCEAN) 0.65 % SOLN nasal spray Place 1 spray into both nostrils  daily as needed for congestion.  Marland Kitchen warfarin (COUMADIN) 5 MG tablet Take as directed by [facility] (Patient taking differently: Take 5-7.5 mg by mouth See admin instructions. 7 pm 7.5 mg (1.5 tabs) on Wednesday and Saturday All other days takes 5 mg (1 tablet))     Allergies:   Lisinopril   Social History   Social History  . Marital status: Married    Spouse name: Brian [name]  . Number of children: 4  . Years of education: masters   Occupational History  . Benefit analyst     Worked with [employer]   Social History Main Topics  . Smoking status: Never Smoker  . Smokeless tobacco: Never Used  . Alcohol use No  . Drug use: No  . Sexual activity: Not Asked   Other Topics Concern  . None   Social History Narrative   Married and lives in [place].  Retired Optometrist.   No reported caffeine use.      Family History: The patient's  family history includes Diabetes in his mother; Pancreatic cancer in his brother and other; Stroke in his father.  ROS:   Please see the history of present illness.    ROS  All other systems reviewed and negative.   EKGs/Labs/Other Studies Reviewed:    The following studies were reviewed today: none  EKG:  EKG is  ordered today.  The ekg ordered today demonstrates atrial fibrillation at 89bpm with nonspecific T wave abnormality  Recent Labs: 01/01/2016: ALT <5; Magnesium 1.9; TSH 0.713 01/03/2016: BUN 24; Creatinine, Ser 1.07; Hemoglobin 11.4; Platelets 164; Potassium 3.7; Sodium 138   Recent Lipid Panel No results found for: CHOL, TRIG, HDL, CHOLHDL, VLDL, LDLCALC, LDLDIRECT  Physical Exam:    VS:  BP 118/68   Pulse 89   Ht 6\' 1"  (1.854 m)   Wt 223 lb (101.2 kg)   BMI 29.42 kg/m     Wt Readings from Last 3 Encounters:  12/23/16 223 lb (101.2 kg)  09/02/16 232 lb (105.2 kg)  07/09/16 232 lb (105.2 kg)     GEN:  Well nourished, well developed in no acute distress HEENT: Normal NECK: No JVD; No carotid  bruits LYMPHATICS: No lymphadenopathy CARDIAC: irregularly irregular, no murmurs, rubs, gallops RESPIRATORY:  Clear to auscultation without rales, wheezing or rhonchi  ABDOMEN: Soft, non-tender, non-distended MUSCULOSKELETAL:  No edema; No deformity  SKIN: Warm and dry NEUROLOGIC:  Alert and oriented x 3 PSYCHIATRIC:  Normal affect   ASSESSMENT:    1. Permanent atrial fibrillation ([place])   2. Essential hypertension   3. Edema extremities    PLAN:    In order of problems listed above:  1. Permanent atrial fibrillation - his HR is well controlled on current medical regimen.  He will continue on Toprol XL 25mg  daily as well as warfarin for CHASD2VASC score of 2.  He is followed in our coumadin clinic.  I will check a CBC.  2.  HTN - BP is well controlled on exam today.  He has not had any symptoms of orthostatic hypotension which can be seen with autonomic dysfunction in Parkinson's.  He will continue on Losartan 50mg  daily, Toprol XL 25mg  daily, Cardura 2mg  qhs .    3.  LE edema - this is chronic due to sedentary state.  He will continue on Lasix 20mg  BID.  I will check a BMET.   Medication Adjustments/Labs and Tests Ordered: Current medicines are reviewed at length with the patient today.  Concerns regarding medicines are outlined above.  No orders of the defined types were placed in this encounter.  No orders of the defined types were placed in this encounter.   Signed, Fransico Him, MD  12/23/2016 10:29 AM    [place]

## 2016-12-23 NOTE — Patient Instructions (Signed)
Medication Instructions:  Your physician recommends that you continue on your current medications as directed. Please refer to the Current Medication list given to you today.   Labwork: LABS TODAY: CBC, BMET  Testing/Procedures: None ordered  Follow-Up: Your physician wants you to follow-up in: 1 year with Dr. Radford Pax. You will receive a reminder letter in the mail two months in advance. If you don't receive a letter, please call our office to schedule the follow-up appointment.   Any Other Special Instructions Will Be Listed Below (If Applicable).     If you need a refill on your cardiac medications before your next appointment, please call your pharmacy.

## 2016-12-24 LAB — BASIC METABOLIC PANEL
BUN / CREAT RATIO: 14 (ref 10–24)
BUN: 17 mg/dL (ref 8–27)
CHLORIDE: 102 mmol/L (ref 96–106)
CO2: 25 mmol/L (ref 20–29)
Calcium: 10 mg/dL (ref 8.6–10.2)
Creatinine, Ser: 1.25 mg/dL (ref 0.76–1.27)
GFR calc non Af Amer: 56 mL/min/{1.73_m2} — ABNORMAL LOW (ref >59)
GFR, EST AFRICAN AMERICAN: 65 mL/min/{1.73_m2} (ref >59)
Glucose: 117 mg/dL — ABNORMAL HIGH (ref 65–99)
POTASSIUM: 4.1 mmol/L (ref 3.5–5.2)
SODIUM: 143 mmol/L (ref 134–144)

## 2016-12-24 LAB — CBC
Hematocrit: 37.4 % — ABNORMAL LOW (ref 37.5–51.0)
Hemoglobin: 12.8 g/dL — ABNORMAL LOW (ref 13.0–17.7)
MCH: 31.1 pg (ref 26.6–33.0)
MCHC: 34.2 g/dL (ref 31.5–35.7)
MCV: 91 fL (ref 79–97)
PLATELETS: 179 10*3/uL (ref 150–379)
RBC: 4.11 x10E6/uL — ABNORMAL LOW (ref 4.14–5.80)
RDW: 13.5 % (ref 12.3–15.4)
WBC: 3 10*3/uL — ABNORMAL LOW (ref 3.4–10.8)

## 2017-01-01 DIAGNOSIS — R350 Frequency of micturition: Secondary | ICD-10-CM | POA: Diagnosis not present

## 2017-01-07 ENCOUNTER — Other Ambulatory Visit: Payer: Self-pay | Admitting: Cardiology

## 2017-01-08 DIAGNOSIS — Z23 Encounter for immunization: Secondary | ICD-10-CM | POA: Diagnosis not present

## 2017-01-12 ENCOUNTER — Encounter: Payer: Self-pay | Admitting: Neurology

## 2017-01-12 ENCOUNTER — Ambulatory Visit (INDEPENDENT_AMBULATORY_CARE_PROVIDER_SITE_OTHER): Payer: Medicare Other | Admitting: Neurology

## 2017-01-12 VITALS — BP 122/75 | HR 78 | Ht 73.0 in | Wt 223.0 lb

## 2017-01-12 DIAGNOSIS — G4752 REM sleep behavior disorder: Secondary | ICD-10-CM | POA: Diagnosis not present

## 2017-01-12 DIAGNOSIS — Z96652 Presence of left artificial knee joint: Secondary | ICD-10-CM

## 2017-01-12 DIAGNOSIS — G20 Parkinson's disease: Secondary | ICD-10-CM

## 2017-01-12 DIAGNOSIS — Z96651 Presence of right artificial knee joint: Secondary | ICD-10-CM

## 2017-01-12 DIAGNOSIS — F419 Anxiety disorder, unspecified: Secondary | ICD-10-CM | POA: Diagnosis not present

## 2017-01-12 DIAGNOSIS — F418 Other specified anxiety disorders: Secondary | ICD-10-CM | POA: Diagnosis not present

## 2017-01-12 MED ORDER — CARBIDOPA-LEVODOPA ER 50-200 MG PO TBCR: 1.0000 | EXTENDED_RELEASE_TABLET | Freq: Every day | ORAL | 3 refills | Status: DC

## 2017-01-12 MED ORDER — CARBIDOPA-LEVODOPA 25-100 MG PO TABS: 2.0000 | ORAL_TABLET | Freq: Four times a day (QID) | ORAL | 3 refills | Status: DC

## 2017-01-12 MED ORDER — ESCITALOPRAM OXALATE 10 MG PO TABS: 10.0000 mg | ORAL_TABLET | Freq: Every day | ORAL | 3 refills | Status: DC

## 2017-01-12 MED ORDER — GABAPENTIN 100 MG PO CAPS: 100.0000 mg | ORAL_CAPSULE | Freq: Three times a day (TID) | ORAL | 3 refills | Status: DC

## 2017-01-12 MED ORDER — CLONAZEPAM 0.5 MG PO TBDP: 0.5000 mg | ORAL_TABLET | Freq: Every day | ORAL | 3 refills | Status: DC

## 2017-01-12 NOTE — Progress Notes (Signed)
Subjective:    Patient ID: Brian [name] is a 76 y.o. male.  HPI     Interim history:   Brian [name] is a 76 year old right-handed gentleman with an underlying medical history of atrial fibrillation, hypertension, OSA, and hyperlipidemia who presents for followup consultation of his right-sided predominant Parkinson's disease complicated by bladder hyperactivity, anxiety and RBD. He is accompanied by his wife again today. I last saw him on 07/09/2016, at which time he was fairly stable.     Today, 01/12/2017 (all dictated new, as well as above notes, some dictation done in note pad or Word, outside of chart, may appear as copied):   He reports doing okay motor-wise, but not very active physically, per wife. Will be joining the [employer] again. He will be working with a Clinical research associate, hopefully. He has had 2 falls since last time. One in the bathroom. Wife reports that he uses his walker at night but not all the way into the bathroom and typically leaves the walker outside the bathroom. They have had the bathroom door widened so we can actually use the walker to go inside but he doesn't. No significant constipation is thankfully reported. No injuries from recent fall. He feels more stiffer and slower at times, some freezing reported.  The patient's allergies, current medications, family history, past medical history, past social history, past surgical history and problem list were reviewed and updated as appropriate.   Previously (copied from previous notes for reference):   I saw him on 03/06/2016 at which time he reported doing better, he had some residual right knee pain. He had an appointment with his orthopedic surgeon. He reported one fall that led to an ER visit via EMS. He also had an admission to the hospital in October 2017 secondary to nausea, vomiting, urinary retention. He was noted to have altered mental status and acute kidney injury. He had a head CT without contrast on 12/31/2015 which showed:     1.  No acute intracranial abnormality. 2. Mild increase in generalized atrophy from 2013. Chronic small vessel ischemia is stable.   He was in the hospital for 4 days, then at [facility] in rehabilitation for about 10 days. He has ongoing issues with particularly right leg stiffness, no additional falls, does not appear to be very motivated to exercise. He had been in the [employer] boxing class. I suggested we continue with Sinemet 2 pills 4 times a [name], Sinemet CR at bedtime, gabapentin, clonazepam for RBD at night and the only change was an increase in Lexapro from 5 mg to 10 mg daily.    I saw him on 10/22/2015, at which time he reported worse mobility, more stiffness in the right leg, more freezing episodes, difficulty getting out of bed especially at night when he would have to go to the bathroom. His wife would have to help him. Thankfully, he had no recent falls and was using a cane. He had finished physical therapy was not using his walker. He was not exercising very much to. He was more sedentary. He had right total knee replacement on 07/12/2015. I suggested we increase his Sinemet to 2 pills 4 times a [name]. We kept his gabapentin the same. We kept the Sinemet CR at bedtime the same.   I saw him on 04/26/2015, at which time he reported taking carbidopa/levodopa 25-100 milligrams strength, about 7 pills per [name], 2 for the 1st dose, 1 1/2 pills for the 2nd and 4 th dose, 2 for the  3rd dose, and 1 CR at night. He finished physical therapy and was able to walk without a cane. He reported right knee pain and indicated that he may need right knee surgery as well.   I saw him on 01/18/2015, at which time he reported doing better slowly. He had finished home health physical therapy. He was supposed to start outpatient physical therapy through orthopedics. He had left total knee arthroplasty on 11/30/2014. I reviewed the operative note as well as his discharge summary. He was discharged on 12/03/2014 to  inpatient rehabilitation. Of note, he presented to the emergency room on 12/16/2014 secondary to repeated vomiting. This was deemed secondary to constipation. Cardiac enzymes were tested which were negative for any acute coronary syndrome. I reviewed the emergency room records. He had blood work which I reviewed. INR was 2.59. Hemoglobin was slightly low at 11.9 and hematocrit was 34.6. Troponin was negative 2. He overall felt a little better since we increased his Sinemet. He was taking a long-acting Sinemet CR at night. He had some off time in the middle of the night.  He felt he was making slow progress after his knee replacement surgery. He had some issues with edema. He was wearing compression stockings for this. He was also placed on furosemide for a little while. He was no longer on narcotic pain medications. He was using a cane at the time. I suggested we continue with his medication regimen, he was alternating Sinemet 2 pills with 1-1/2 pills for a total of 4 doses a [name].   He missed an appointment on 12/19/2014. I saw him on 08/22/2014, at which time he reported bilateral knee pain. He had seen Brian [name] for his knee arthritis and had undergone injections into both knees. He did not think the injections helped a lot. He was exploring knee replacement surgery. He was having more difficulty getting out of chairs. He had no freezing. He did think that the increase in Sinemet had helped in the past. He was alternating 1 pill with 1-1/2 pills. He was taking Sinemet CR at night. He was on gabapentin 100 mg 3 times a [name] and Lexapro 5 mg daily as well as clonazepam 0.5 mg each night. He had no new memory or mood issues. He was overall moving slower. He had no recent falls.   In the interim, his wife called on 01/04/2015 reported that he had worsening symptoms. I suggested we increase his Sinemet slightly to 2 tablets alternating with 1-1/2 tablets.   I saw him on 06/01/2014, at which time he reported  overall doing well. He was not able to sleep through the night. He was consistently waking up between 3 and 4 AM. He had nocturia twice on average. He felt improved from the bladder medication but still reported disrupted sleep. He felt clonazepam has helped. His wife agreed. He also felt that the increase in Sinemet was helpful in his motor function. It was difficult for him to keep up with the 5 pills a [name] schedule. He was going to the [facility] 3 times a week for about 30 minutes. Lexapro has helped his mood. His wife felt that he was less anxious. I suggested we increase his clonazepam to 0.5 mg each night. I suggested we change his Sinemet to 1 pill alternating with one half pills for a total of 5 pills daily but for doses. I suggested he continue with Sinemet CR at bedtime.   I saw him on 03/27/2014, at which time  he reported sleeping a little bit better but his tremor was worse. He did not feel Sinemet was lasting him 4 hours in between 2 different doses. He was going to the gym twice a week. He was back on an antidepressant. He was supposed to start a new bladder medication. He never actually started clonazepam for RBD. I prescribed clonazepam for him for RBD. I increased his Sinemet to one fill 5 times a [name].   I saw him on 12/01/2013, at which time he reported, that he stopped Myrbetric due to new onset facial swelling, but this was no better after stopping it. His facial swelling may have started after the lexapro. Adding long-acting Sinemet helped him sleep a little bit longer up to 3 AM. I suggested he stop Lexapro because of a possible allergic reaction to it. I also added low-dose clonazepam for his RBD and for his sleep.   I saw him on 09/29/2013 at which time he presented for a sooner than scheduled appointment because of increasing tremors and parkinsonian symptoms. He also felt more anxious but not frankly depressed. His wife felt that he may have been depressed. She reported dream enactments.  She also reported loud snoring. He had a sleep study over 5 years ago, and that was negative for OSA at the time. He had started Myrbetriq recently. He has been taking gabapentin 3 times a [name], and 7, 11 and 3. He takes Sinemet regularly at 7, 11, 3, and 7. I suggested that he start low-dose Lexapro at 5 mg strength. I changed the timing of his gabapentin to one pill at 7 AM, 1 pill at 11 AM and one pill at 7 PM. I asked him to continue with Sinemet 4 times a [name]. I asked him to add a long-acting Sinemet CR 50/200 mg strength once daily at bed time. I suggested he return for a sleep study. He had a diagnostic polysomnogram on 11/09/2013 and I went over his test results with him in detail today. Sleep efficiency was reduced at 58.7% with a latency to sleep of 82 minutes and wake after sleep onset of 117 minutes with mild to moderate sleep fragmentation noted. He had an increased percentage of light stage sleep, absence of deep sleep and 16.8% of dream sleep with a prolonged REM latency. He had mild to moderate snoring. Total AHI was 3.2 per hour, he had some lack of REM atonia.   I saw him on 05/31/2013, at which time I felt he was fairly stable. I kept him on Sinemet 4 times a [name] and gabapentin 100 mg 3 times a [name]. He reported, going up to 4 times a [name] with his Sinemet helped. He reported no side effects with gabapentin or Sinemet and seemed to tolerate them well. He had some residual facial tingling which was tolerable to him. He has LBP and went to [facility]. He had an Xray of the back and was told there was degenerative disease and was given and was given a 3 week taper of oral steroids, which helped. He had stopped exercising d/t back pain and was scheduled for PT evaluation through ortho. He has been on metamucil for constipation which helped.   I saw him on 11/30/2012, at which time I suggested that he continue gabapentin 100 mg 1 pill 3 times a [name] for his paresthesias and encouraged him to continue  Sinemet with 25/100 mg strength one tablet 4 times a [name], at 7, 11, 3 PM and 7 PM.  I first met him on 06/22/2012 and he presented on 08/23/2012 for a sooner than scheduled appointment because he felt his medication was not helpful. He previously followed with Dr. Morene [name] and had been complaining of paresthesias. Brian [name] had started him on low-dose gabapentin. For his parkinsonism which was first noted in June of 2012 he was tried on pramipexole which helped, but at his first visit with me he told me that his gabapentin was not helpful and he also discontinued pramipexole a few months prior because of swelling in his feet and ankles. This improved after stopping both medications. At the time of his first visit with me I felt he had mild parkinsonism without much in the way of lateralization. I started him on gabapentin again because of his paresthesias and also asked him to start low-dose Sinemet. He called back stating that the Sinemet was not helpful. He requested a sooner appointment. He had been continuing taking gabapentin 100 mg 3 times a [name] and Sinemet one pill 3 times a [name] with minimal improvement as he reported last time. I suggested at that visit that he increase his Sinemet to one pill 4 times a [name] and continue with gabapentin 100 mg 3 times a [name]. I also felt that he may have right sided predominant idiopathic Parkinson's disease due to an intermittent tremor noted only on the right side.    His Past Medical History Is Significant For: Past Medical History:  Diagnosis Date  . Abnormal PFT 1. 05/18/08  2. 11/30/08   1. Showed mild airflow obstruction, mild restriction, mild diffusion defect; FEV1 2.22(64%), FVC 3.33(65%), FEVi% 67, TLC 5.19(69%), DLCO 77%, +BD  2. FEV1 2.38(73%), FVC 3.81(80%), FEV1% 63, TLC 5.61(80%), DLCO 79%, no BD  . Allergic rhinitis   . Anxiety   . BPH (benign prostatic hyperplasia)   . Cellulitis    right leg MRSA  . Erectile dysfunction   . GERD  (gastroesophageal reflux disease)   . Heart murmur    mild MR by echo  . Hyperlipidemia   . Hypertension   . Osteoarthritis   . Paresthesias 06/22/2012  . Parkinsonism ([place]) 06/22/2012  . Permanent atrial fibrillation ([place])   . Restrictive lung disease     His Past Surgical History Is Significant For: Past Surgical History:  Procedure Laterality Date  . CARDIAC CATHETERIZATION  11/2009   normal coronary arteries  . CARDIOVERSION     multiple  . CATARACT EXTRACTION    . COLONOSCOPY    . COLONOSCOPY WITH PROPOFOL N/A 09/02/2016   Procedure: COLONOSCOPY WITH PROPOFOL;  Surgeon: Brian Fair, MD;  Location: [facility];  Service: Endoscopy;  Laterality: N/A;  . EYE SURGERY     bilateral cataract extraction  . JOINT REPLACEMENT    . TOTAL KNEE ARTHROPLASTY Left 11/30/2014   Procedure: TOTAL KNEE ARTHROPLASTY;  Surgeon: Brian Day, MD;  Location: [facility];  Service: Orthopedics;  Laterality: Left;  . TOTAL KNEE ARTHROPLASTY Right 07/12/2015   Procedure: RIGHT TOTAL KNEE ARTHROPLASTY;  Surgeon: Brian Day, MD;  Location: [facility];  Service: Orthopedics;  Laterality: Right;    His Family History Is Significant For: Family History  Problem Relation Age of Onset  . Diabetes Mother        DM  . Stroke Father        CVA  . Pancreatic cancer Brother   . Pancreatic cancer Other        Nephew    His Social History Is Significant  For: Social History   Social History  . Marital status: Married    Spouse name: Brian [name]  . Number of children: 4  . Years of education: masters   Occupational History  . Benefit analyst     Worked with Brian [name]   Social History Main Topics  . Smoking status: Never Smoker  . Smokeless tobacco: Never Used  . Alcohol use No  . Drug use: No  . Sexual activity: Not Asked   Other Topics Concern  . None   Social History Narrative   Married and lives in [place].  Retired Optometrist.   No reported caffeine use.     His Allergies Are:   Allergies  Allergen Reactions  . Lisinopril Cough  :   His Current Medications Are:  Outpatient Encounter Prescriptions as of 01/12/2017  Medication Sig  . AMLODIPINE BESYLATE PO Take by mouth.  Marland Kitchen ammonium lactate (LAC-HYDRIN) 12 % cream Apply topically as needed for dry skin. As prescribed  . carbidopa-levodopa (SINEMET CR) 50-200 MG tablet Take 1 tablet by mouth at bedtime.  . carbidopa-levodopa (SINEMET IR) 25-100 MG tablet Take 2 tablets by mouth 4 (four) times daily.  . cholecalciferol (VITAMIN D) 1000 units tablet Take 1,000 Units by mouth daily. 7 am  . clonazePAM (KLONOPIN) 0.5 MG disintegrating tablet Take 1 tablet (0.5 mg total) by mouth at bedtime.  Marland Kitchen doxazosin (CARDURA) 2 MG tablet Take 2 mg by mouth at bedtime.   Marland Kitchen escitalopram (LEXAPRO) 10 MG tablet Take 1 tablet (10 mg total) by mouth at bedtime.  . famotidine (PEPCID) 40 MG tablet Take 40 mg by mouth 2 (two) times daily. 7am 7 pm  . furosemide (LASIX) 20 MG tablet Take 20 mg by mouth 2 (two) times daily. 7 am, 7 pm  . gabapentin (NEURONTIN) 100 MG capsule Take 1 capsule (100 mg total) by mouth 3 (three) times daily.  . metoprolol succinate (TOPROL-XL) 25 MG 24 hr tablet Take 25 mg by mouth every evening. 7 pm  . Multiple Vitamin (MULTIVITAMIN WITH MINERALS) TABS tablet Take 1 tablet by mouth daily. 7 am  . potassium chloride (K-DUR,KLOR-CON) 10 MEQ tablet Take 10 mEq by mouth daily. 7 am  . pravastatin (PRAVACHOL) 40 MG tablet Take 40 mg by mouth daily.   . sodium chloride (OCEAN) 0.65 % SOLN nasal spray Place 1 spray into both nostrils daily as needed for congestion.  Marland Kitchen warfarin (COUMADIN) 5 MG tablet TAKE AS DIRECTED BY COUMADIN CLINIC  . [DISCONTINUED] losartan (COZAAR) 50 MG tablet Take 50 mg by mouth daily.   No facility-administered encounter medications on file as of 01/12/2017.   :  Review of Systems:  Out of a complete 14 point review of systems, all are reviewed and negative with the exception of these  symptoms as listed below: Review of Systems  Neurological:       Pt presents today to discuss his PD. Pt reports that he is doing well.    Objective:  Neurological Exam  Physical Exam Physical Examination:   Vitals:   01/12/17 1147  BP: 122/75  Pulse: 78    General Examination: The patient is a very pleasant 76 y.o. male in no acute distress. He appears well-developed and well-nourished and well groomed.   HEENT: Normocephalic, atraumatic, pupils are equal, round and reactive to light and accommodation. s/p cataract repairs. Extraocular tracking shows mild saccadic breakdown. Moderate to severe nuclear rigidity is noted. No lip, neck or jaw tremor is noted. He has mod  hypophonia, no dysarthria noted. Oropharynx examination reveals no new findings. Tongue is central, palate symmetrical.   Chest: Clear to auscultation without wheezing, rhonchi or crackles noted.  Heart: S1+S2+0, regular and normal without murmurs, rubs or gallops noted.   Abdomen: Soft, non-tender and non-distended with normal bowel sounds appreciated on auscultation.  Extremities: There is no pitting edema in the distal lower extremities bilaterally.   Skin: Warm and dry without trophic changes noted.  Musculoskeletal: exam reveals no obvious joint deformities, tenderness or joint swelling or erythema, healing/healed scars from TKAs b/l.   Neurologically:  Mental status: The patient is awake, alert and oriented in all 4 spheres. His immediate and remote memory, attention, language skills and fund of knowledge are appropriate. There is no evidence of aphasia, agnosia, apraxia or anomia. Speech is clear with normal prosody and enunciation. Thought process is linear. Mood is normal and affect is normal.  Cranial nerves II - XII are as described above under HEENT exam. In addition: shoulder shrug is normal with equal shoulder height noted. Motor exam: Normal bulk, strength and tone is noted. There is no drift,  tremor or rebound. Romberg is not tested for safety. Reflexes are 1+ throughout, absent in the knees. Fine motor skills and coordination: moderateTo severe impairment on the right, moderate impairment on the left. Cerebellar testing: No dysmetria or intention tremor.  Sensory exam: intact to light touch in the upper and lower extremities.  Gait, station and balance: He stands with moderate to severe difficulty, pushes himself up, Requires 2 attempts. Posture moderately stooped. Decreased pace and decrease stride length, decreased armswing b/l. Balance in mild to moderately impaired. Gait and posture seemed to be slightly worse.   Assessment and Plan:   In summary, GYASI HAZZARD is a very pleasant 76 year old male with a history of atrial fibrillation, hypertension, hyperlipidemia, who presents for followup consultation of his right-sided predominant Parkinson's disease, most likely akinetic-rigid type, complicated by anxiety, mild depression, sleep disorder, including sleep fragmentation, nocturia, insomnia, obesity, arthritis, and RBD and recent status post left (2016), then right (2017)total knee replacement surgeries. He has had progression with time. No significant mood disorder or hallucinations or delusions, no significant constipation at this time. Nevertheless, we talked About the challenges of Advancing Parkinson's disease quite a bit today. We also talked aboutGait safety and fall risk. He is advised to use his walker at least at night when he goes to the bathroom and take the walker into the bathroom and not leave it at the door. Unfortunately, his bedroom is upstairs. His wife reports that they will be looking into moving to a single level home by next year. I think this is a good idea. Medication wise, I suggested we continue with the current medication regimen and timing. He is encouraged to add more exercise to his [name]-to-[name] routine. We takes Sinemet IR 2 pills 4 times a [name], and the CR at  night. We increased the lexapro generic to 10 mg last time, in Dec. 2017. Will continue all other meds, refills were provided today. FU in 4 months. I answered all their questions and patient and his wife were in agreement.  I spent 25 minutes in total face-to-face time with the patient, more than 50% of which was spent in counseling and coordination of care, reviewing test results, reviewing medication and discussing or reviewing the diagnosis of PD, its prognosis and treatment options. Pertinent laboratory and imaging test results that were available during this visit with the patient  were reviewed by me and considered in my medical decision making (see chart for details).

## 2017-01-12 NOTE — Patient Instructions (Addendum)
Please remember: walk with walker at night, all the way into the bathroom. Turn slowly, no bending down to pick anything, no heavy lifting, be extra careful at night and first thing in the morning. Also, be careful in the Bathroom and the kitchen especially.   Please increase your exercise level, try to exercise on a regular basis.   We will keep your meds the same.   Please ensure good nutrition and good hydration with water.

## 2017-01-14 ENCOUNTER — Ambulatory Visit (INDEPENDENT_AMBULATORY_CARE_PROVIDER_SITE_OTHER): Payer: Medicare Other | Admitting: Podiatry

## 2017-01-14 ENCOUNTER — Encounter: Payer: Self-pay | Admitting: Podiatry

## 2017-01-14 DIAGNOSIS — B351 Tinea unguium: Secondary | ICD-10-CM

## 2017-01-14 DIAGNOSIS — D689 Coagulation defect, unspecified: Secondary | ICD-10-CM

## 2017-01-14 DIAGNOSIS — M79676 Pain in unspecified toe(s): Secondary | ICD-10-CM | POA: Diagnosis not present

## 2017-01-14 NOTE — Progress Notes (Signed)
Patient ID: Brian [name], male   DOB: December 31, 1940, 76 y.o.   MRN: [id number] Complaint:  Visit Type: Patient returns to my office for continued preventative foot care services. Complaint: Patient states" my nails have grown long and thick and become painful to walk and wear shoes" . The patient presents for preventative foot care services. No changes to ROS. Patient is taking coumadin.  Podiatric Exam: Vascular: dorsalis pedis and posterior tibial pulses are palpable bilateral. Capillary return is immediate. Temperature gradient is WNL. Skin turgor WNL  Sensorium: Normal Semmes Weinstein monofilament test. Normal tactile sensation bilaterally. Nail Exam: Pt has thick disfigured discolored nails with subungual debris noted bilateral entire nail hallux through fifth toenails Ulcer Exam: There is no evidence of ulcer or pre-ulcerative changes or infection. Orthopedic Exam: Muscle tone and strength are WNL. No limitations in general ROM. No crepitus or effusions noted. Foot type and digits show no abnormalities. Bony prominences are unremarkable. Skin: No Porokeratosis. No infection or ulcers  Diagnosis:  Onychomycosis, , Pain in right toe, pain in left toes,    Treatment & Plan Procedures and Treatment: Consent by patient was obtained for treatment procedures. The patient understood the discussion of treatment and procedures well. All questions were answered thoroughly reviewed. Debridement of mycotic and hypertrophic toenails, 1 through 5 bilateral and clearing of subungual debris. No ulceration, no infection noted.  Return Visit-Office Procedure: Patient instructed to return to the office for a follow up visit 10 weeks  for continued evaluation and treatment.   Gardiner Barefoot DPM

## 2017-01-22 DIAGNOSIS — R350 Frequency of micturition: Secondary | ICD-10-CM | POA: Diagnosis not present

## 2017-02-02 DIAGNOSIS — N3281 Overactive bladder: Secondary | ICD-10-CM | POA: Diagnosis not present

## 2017-02-02 DIAGNOSIS — N400 Enlarged prostate without lower urinary tract symptoms: Secondary | ICD-10-CM | POA: Diagnosis not present

## 2017-02-05 ENCOUNTER — Ambulatory Visit (INDEPENDENT_AMBULATORY_CARE_PROVIDER_SITE_OTHER): Payer: Medicare Other | Admitting: *Deleted

## 2017-02-05 DIAGNOSIS — Z5181 Encounter for therapeutic drug level monitoring: Secondary | ICD-10-CM

## 2017-02-05 DIAGNOSIS — I4891 Unspecified atrial fibrillation: Secondary | ICD-10-CM | POA: Diagnosis not present

## 2017-02-05 LAB — POCT INR: INR: 3.4

## 2017-02-05 NOTE — Patient Instructions (Signed)
Do not take any Coumadin today then continue taking 1 tablet daily except  1.5 tablets each Wednesday and Saturday.  Continue eating dark green leafy veggies three times a week.  Recheck INR in 4 weeks.   Call with any questions or concerns Coumadin Clinic [phone]

## 2017-02-19 DIAGNOSIS — R350 Frequency of micturition: Secondary | ICD-10-CM | POA: Diagnosis not present

## 2017-03-05 ENCOUNTER — Ambulatory Visit (INDEPENDENT_AMBULATORY_CARE_PROVIDER_SITE_OTHER): Payer: Medicare Other | Admitting: *Deleted

## 2017-03-05 DIAGNOSIS — I482 Chronic atrial fibrillation: Secondary | ICD-10-CM

## 2017-03-05 DIAGNOSIS — I4891 Unspecified atrial fibrillation: Secondary | ICD-10-CM

## 2017-03-05 DIAGNOSIS — Z5181 Encounter for therapeutic drug level monitoring: Secondary | ICD-10-CM | POA: Diagnosis not present

## 2017-03-05 DIAGNOSIS — I4821 Permanent atrial fibrillation: Secondary | ICD-10-CM

## 2017-03-05 LAB — POCT INR: INR: 2.3

## 2017-03-05 NOTE — Patient Instructions (Signed)
Description   Continue taking 1 tablet daily except  1.5 tablets each Wednesday and Saturday.  Continue eating dark green leafy veggies three times a week.  Recheck INR in 6 weeks.   Call with any questions or concerns Coumadin Clinic [phone]

## 2017-03-13 DIAGNOSIS — R350 Frequency of micturition: Secondary | ICD-10-CM | POA: Diagnosis not present

## 2017-04-01 ENCOUNTER — Encounter: Payer: Self-pay | Admitting: Podiatry

## 2017-04-01 ENCOUNTER — Ambulatory Visit (INDEPENDENT_AMBULATORY_CARE_PROVIDER_SITE_OTHER): Payer: Medicare Other | Admitting: Podiatry

## 2017-04-01 DIAGNOSIS — B351 Tinea unguium: Secondary | ICD-10-CM

## 2017-04-01 DIAGNOSIS — L84 Corns and callosities: Secondary | ICD-10-CM

## 2017-04-01 DIAGNOSIS — D689 Coagulation defect, unspecified: Secondary | ICD-10-CM

## 2017-04-01 DIAGNOSIS — M79676 Pain in unspecified toe(s): Secondary | ICD-10-CM

## 2017-04-01 NOTE — Progress Notes (Addendum)
Patient ID: Brian [name], male   DOB: [birth year], 77 y.o.   MRN: [id number] Complaint:  Visit Type: Patient returns to my office for continued preventative foot care services. Complaint: Patient states" my nails have grown long and thick and become painful to walk and wear shoes" . The patient presents for preventative foot care services. No changes to ROS. Patient is taking coumadin.  Podiatric Exam: Vascular: dorsalis pedis and posterior tibial pulses are palpable bilateral. Capillary return is immediate. Temperature gradient is WNL. Skin turgor WNL  Sensorium: Normal Semmes Weinstein monofilament test. Normal tactile sensation bilaterally. Nail Exam: Pt has thick disfigured discolored nails with subungual debris noted bilateral entire nail hallux through fifth toenails Ulcer Exam: There is no evidence of ulcer or pre-ulcerative changes or infection. Orthopedic Exam: Muscle tone and strength are WNL. No limitations in general ROM. No crepitus or effusions noted. Foot type and digits show no abnormalities. Bony prominences are unremarkable. Skin: No Porokeratosis. No infection or ulcers  Diagnosis:  Onychomycosis, , Pain in right toe, pain in left toes,    Treatment & Plan Procedures and Treatment: Consent by patient was obtained for treatment procedures. The patient understood the discussion of treatment and procedures well. All questions were answered thoroughly reviewed. Debridement of mycotic and hypertrophic toenails, 1 through 5 bilateral and clearing of subungual debris. No ulceration, no infection noted..  Debrided heel callus with dremel tool.  Told him to use lac-hydrin now the callus has been debrided left heel.  Return Visit-Office Procedure: Patient instructed to return to the office for a follow up visit 10 weeks  for continued evaluation and treatment.   Gardiner Barefoot DPM

## 2017-04-03 DIAGNOSIS — R350 Frequency of micturition: Secondary | ICD-10-CM | POA: Diagnosis not present

## 2017-04-16 ENCOUNTER — Ambulatory Visit (INDEPENDENT_AMBULATORY_CARE_PROVIDER_SITE_OTHER): Payer: Medicare Other | Admitting: *Deleted

## 2017-04-16 DIAGNOSIS — I4891 Unspecified atrial fibrillation: Secondary | ICD-10-CM

## 2017-04-16 DIAGNOSIS — Z5181 Encounter for therapeutic drug level monitoring: Secondary | ICD-10-CM | POA: Diagnosis not present

## 2017-04-16 LAB — POCT INR: INR: 2.8

## 2017-04-16 NOTE — Patient Instructions (Signed)
Description   Continue taking 1 tablet daily except  1.5 tablets each Wednesday and Saturday.  Continue eating dark green leafy veggies three times a week.  Recheck INR in 6 weeks.   Call with any questions or concerns [facility]in Clinic [phone]

## 2017-04-17 DIAGNOSIS — Z0000 Encounter for general adult medical examination without abnormal findings: Secondary | ICD-10-CM | POA: Diagnosis not present

## 2017-04-17 DIAGNOSIS — E782 Mixed hyperlipidemia: Secondary | ICD-10-CM | POA: Diagnosis not present

## 2017-04-17 DIAGNOSIS — I10 Essential (primary) hypertension: Secondary | ICD-10-CM | POA: Diagnosis not present

## 2017-04-17 DIAGNOSIS — N400 Enlarged prostate without lower urinary tract symptoms: Secondary | ICD-10-CM | POA: Diagnosis not present

## 2017-04-17 DIAGNOSIS — D696 Thrombocytopenia, unspecified: Secondary | ICD-10-CM | POA: Diagnosis not present

## 2017-04-17 DIAGNOSIS — R7309 Other abnormal glucose: Secondary | ICD-10-CM | POA: Diagnosis not present

## 2017-04-17 DIAGNOSIS — I872 Venous insufficiency (chronic) (peripheral): Secondary | ICD-10-CM | POA: Diagnosis not present

## 2017-04-17 DIAGNOSIS — K219 Gastro-esophageal reflux disease without esophagitis: Secondary | ICD-10-CM | POA: Diagnosis not present

## 2017-04-17 DIAGNOSIS — N182 Chronic kidney disease, stage 2 (mild): Secondary | ICD-10-CM | POA: Diagnosis not present

## 2017-04-17 DIAGNOSIS — R269 Unspecified abnormalities of gait and mobility: Secondary | ICD-10-CM | POA: Diagnosis not present

## 2017-04-17 DIAGNOSIS — I482 Chronic atrial fibrillation: Secondary | ICD-10-CM | POA: Diagnosis not present

## 2017-04-17 DIAGNOSIS — Z1389 Encounter for screening for other disorder: Secondary | ICD-10-CM | POA: Diagnosis not present

## 2017-04-17 DIAGNOSIS — Z23 Encounter for immunization: Secondary | ICD-10-CM | POA: Diagnosis not present

## 2017-04-17 DIAGNOSIS — I519 Heart disease, unspecified: Secondary | ICD-10-CM | POA: Diagnosis not present

## 2017-04-17 DIAGNOSIS — G20 Parkinson's disease: Secondary | ICD-10-CM | POA: Diagnosis not present

## 2017-04-27 ENCOUNTER — Emergency Department (HOSPITAL_COMMUNITY)
Admission: EM | Admit: 2017-04-27 | Discharge: 2017-04-27 | Disposition: A | Discharge status: Home / Self Care | Payer: Medicare Other | Attending: Emergency Medicine | Admitting: Emergency Medicine

## 2017-04-27 ENCOUNTER — Emergency Department (HOSPITAL_COMMUNITY): Payer: Medicare Other

## 2017-04-27 DIAGNOSIS — R600 Localized edema: Secondary | ICD-10-CM | POA: Diagnosis not present

## 2017-04-27 DIAGNOSIS — G20 Parkinson's disease: Secondary | ICD-10-CM | POA: Diagnosis not present

## 2017-04-27 DIAGNOSIS — M25021 Hemarthrosis, right elbow: Secondary | ICD-10-CM | POA: Diagnosis not present

## 2017-04-27 DIAGNOSIS — Y929 Unspecified place or not applicable: Secondary | ICD-10-CM | POA: Insufficient documentation

## 2017-04-27 DIAGNOSIS — Z7901 Long term (current) use of anticoagulants: Secondary | ICD-10-CM | POA: Diagnosis not present

## 2017-04-27 DIAGNOSIS — Y939 Activity, unspecified: Secondary | ICD-10-CM | POA: Diagnosis not present

## 2017-04-27 DIAGNOSIS — Y999 Unspecified external cause status: Secondary | ICD-10-CM | POA: Insufficient documentation

## 2017-04-27 DIAGNOSIS — Z8679 Personal history of other diseases of the circulatory system: Secondary | ICD-10-CM | POA: Insufficient documentation

## 2017-04-27 DIAGNOSIS — Z79899 Other long term (current) drug therapy: Secondary | ICD-10-CM | POA: Diagnosis not present

## 2017-04-27 DIAGNOSIS — T1490XA Injury, unspecified, initial encounter: Secondary | ICD-10-CM | POA: Diagnosis not present

## 2017-04-27 DIAGNOSIS — I129 Hypertensive chronic kidney disease with stage 1 through stage 4 chronic kidney disease, or unspecified chronic kidney disease: Secondary | ICD-10-CM | POA: Diagnosis not present

## 2017-04-27 DIAGNOSIS — R262 Difficulty in walking, not elsewhere classified: Secondary | ICD-10-CM | POA: Insufficient documentation

## 2017-04-27 DIAGNOSIS — S53401A Unspecified sprain of right elbow, initial encounter: Secondary | ICD-10-CM | POA: Diagnosis not present

## 2017-04-27 DIAGNOSIS — N182 Chronic kidney disease, stage 2 (mild): Secondary | ICD-10-CM | POA: Diagnosis not present

## 2017-04-27 DIAGNOSIS — M7989 Other specified soft tissue disorders: Secondary | ICD-10-CM | POA: Diagnosis not present

## 2017-04-27 DIAGNOSIS — W19XXXA Unspecified fall, initial encounter: Secondary | ICD-10-CM

## 2017-04-27 DIAGNOSIS — M25561 Pain in right knee: Secondary | ICD-10-CM | POA: Diagnosis not present

## 2017-04-27 DIAGNOSIS — W010XXA Fall on same level from slipping, tripping and stumbling without subsequent striking against object, initial encounter: Secondary | ICD-10-CM | POA: Insufficient documentation

## 2017-04-27 DIAGNOSIS — M25521 Pain in right elbow: Secondary | ICD-10-CM | POA: Diagnosis not present

## 2017-04-27 DIAGNOSIS — S0990XA Unspecified injury of head, initial encounter: Secondary | ICD-10-CM | POA: Diagnosis not present

## 2017-04-27 DIAGNOSIS — S59901A Unspecified injury of right elbow, initial encounter: Secondary | ICD-10-CM | POA: Diagnosis not present

## 2017-04-27 LAB — BASIC METABOLIC PANEL
ANION GAP: 11 (ref 5–15)
BUN: 15 mg/dL (ref 6–20)
CO2: 25 mmol/L (ref 22–32)
CREATININE: 1.06 mg/dL (ref 0.61–1.24)
Calcium: 9.3 mg/dL (ref 8.9–10.3)
Chloride: 104 mmol/L (ref 101–111)
Glucose, Bld: 83 mg/dL (ref 65–99)
Potassium: 3.7 mmol/L (ref 3.5–5.1)
SODIUM: 140 mmol/L (ref 135–145)

## 2017-04-27 LAB — PROTIME-INR
INR: 2.36
PROTHROMBIN TIME: 25.6 s — AB (ref 11.4–15.2)

## 2017-04-27 LAB — CBC WITH DIFFERENTIAL/PLATELET
BASOS ABS: 0 10*3/uL (ref 0.0–0.1)
BASOS PCT: 1 %
EOS ABS: 0.2 10*3/uL (ref 0.0–0.7)
Eosinophils Relative: 4 %
HCT: 38.9 % — ABNORMAL LOW (ref 39.0–52.0)
HEMOGLOBIN: 12.5 g/dL — AB (ref 13.0–17.0)
LYMPHS ABS: 0.7 10*3/uL (ref 0.7–4.0)
Lymphocytes Relative: 17 %
MCH: 31.5 pg (ref 26.0–34.0)
MCHC: 32.1 g/dL (ref 30.0–36.0)
MCV: 98 fL (ref 78.0–100.0)
Monocytes Absolute: 0.6 10*3/uL (ref 0.1–1.0)
Monocytes Relative: 13 %
NEUTROS PCT: 65 %
Neutro Abs: 2.9 10*3/uL (ref 1.7–7.7)
PLATELETS: 139 10*3/uL — AB (ref 150–400)
RBC: 3.97 MIL/uL — AB (ref 4.22–5.81)
RDW: 13.1 % (ref 11.5–15.5)
WBC: 4.4 10*3/uL (ref 4.0–10.5)

## 2017-04-27 MED ORDER — CARBIDOPA-LEVODOPA 25-100 MG PO TABS
2.0000 | ORAL_TABLET | Freq: Four times a day (QID) | ORAL | Status: DC
  Administered 2017-04-27: 2 via ORAL
  Filled 2017-04-27 (×4): qty 2

## 2017-04-27 NOTE — Progress Notes (Signed)
Orthopedic Tech Progress Note Patient Details:  Brian [name] 1940-04-09 [id number]  Ortho Devices Type of Ortho Device: Ace wrap Ortho Device/Splint Location: rue ace wrap to elbow  Ortho Device/Splint Interventions: Ordered, Application After informing Dr that we did not have an elbow brace. He requested I apply an ace wrap to the elbow.    Post Interventions Patient Tolerated: Well Instructions Provided: Care of device, Adjustment of device   Karolee Stamps 04/27/2017, 5:58 AM

## 2017-04-27 NOTE — ED Notes (Signed)
Ortho tech paged about elbow brace

## 2017-04-27 NOTE — ED Provider Notes (Signed)
[facility] EMERGENCY DEPARTMENT Provider Note   CSN: [id number] Arrival date & time: 04/27/17  0306     History   Chief Complaint Chief Complaint  Patient presents with  . Fall    HPI Brian [name] is a 77 y.o. male.  HPI Patient with history of Parkinsons disease, A. fib on Coumadin comes in with chief complaint of fall. Patient has history of imbalance, and had a mechanical fall prior to ER arrival.  Patient fell onto his right side and is having right elbow pain.  Patient denies any severe headaches, numbness, tingling, weakness, neck pain.  Patient is also having right knee pain.  Patient states that after the fall he was unable to get up and walk.  Due to his Parkinson's disease he needs upper extremity strength to get up and then walk with cane/walker.  Past Medical History:  Diagnosis Date  . Abnormal PFT 1. 05/18/08  2. 11/30/08   1. Showed mild airflow obstruction, mild restriction, mild diffusion defect; FEV1 2.22(64%), FVC 3.33(65%), FEVi% 67, TLC 5.19(69%), DLCO 77%, +BD  2. FEV1 2.38(73%), FVC 3.81(80%), FEV1% 63, TLC 5.61(80%), DLCO 79%, no BD  . Allergic rhinitis   . Anxiety   . BPH (benign prostatic hyperplasia)   . Cellulitis    right leg MRSA  . Erectile dysfunction   . GERD (gastroesophageal reflux disease)   . Heart murmur    mild MR by echo  . Hyperlipidemia   . Hypertension   . Osteoarthritis   . Paresthesias 06/22/2012  . Parkinsonism ([place]) 06/22/2012  . Permanent atrial fibrillation ([place])   . Restrictive lung disease     Patient Active Problem List   Diagnosis Date Noted  . Atrial fibrillation ([place]) [I48.91] 02/05/2017  . Permanent atrial fibrillation ([place])   . Urinary retention 12/31/2015  . Acute renal failure (ARF) ([place]) 12/31/2015  . Acute encephalopathy 12/31/2015  . Primary osteoarthritis of right knee 07/12/2015  . Primary osteoarthritis of left knee 11/30/2014  . Encounter for therapeutic drug monitoring 06/21/2013    . Edema extremities 02/04/2013  . Chronic anticoagulation 02/04/2013  . GERD (gastroesophageal reflux disease)   . Restrictive lung disease   . CKD (chronic kidney disease) stage 2, GFR 60-89 ml/min   . Cellulitis   . Allergic rhinitis   . Anxiety   . Long term (current) use of anticoagulants 01/25/2013  . Parkinsonism ([place]) 06/22/2012  . Paresthesias 06/22/2012  . PULMONARY NODULE 06/02/2008  . SNORING 06/02/2008  . Hyperlipidemia 05/15/2008  . Essential hypertension 05/15/2008  . PULMONARY FUNCTION TESTS, ABNORMAL 05/15/2008    Past Surgical History:  Procedure Laterality Date  . CARDIAC CATHETERIZATION  11/2009   normal coronary arteries  . CARDIOVERSION     multiple  . CATARACT EXTRACTION    . COLONOSCOPY    . COLONOSCOPY WITH PROPOFOL N/A 09/02/2016   Procedure: COLONOSCOPY WITH PROPOFOL;  Surgeon: Garlan Fair, MD;  Location: [facility] END[facility]COPY;  Service: Endoscopy;  Laterality: N/A;  . EYE SURGERY     bilateral cataract extraction  . JOINT REPLACEMENT    . TOTAL KNEE ARTHROPLASTY Left 11/30/2014   Procedure: TOTAL KNEE ARTHROPLASTY;  Surgeon: Susa Day, MD;  Location: [facility] ORS;  Service: Orthopedics;  Laterality: Left;  . TOTAL KNEE ARTHROPLASTY Right 07/12/2015   Procedure: RIGHT TOTAL KNEE ARTHROPLASTY;  Surgeon: Susa Day, MD;  Location: [facility] ORS;  Service: Orthopedics;  Laterality: Right;       Home Medications    Prior to  Admission medications   Medication Sig Start Date End Date Taking? Authorizing Provider  AMLODIPINE BESYLATE PO Take by mouth.    [provider]  ammonium lactate (LAC-HYDRIN) 12 % cream Apply topically as needed for dry skin. As prescribed 11/05/16   Gardiner Barefoot, DPM  carbidopa-levodopa (SINEMET CR) 50-200 MG tablet Take 1 tablet by mouth at bedtime. 01/12/17   Star Age, MD  carbidopa-levodopa (SINEMET IR) 25-100 MG tablet Take 2 tablets by mouth 4 (four) times daily. 01/12/17   Star Age, MD  cholecalciferol (VITAMIN  D) 1000 units tablet Take 1,000 Units by mouth daily. 7 am    [provider]  clonazePAM (KLONOPIN) 0.5 MG disintegrating tablet Take 1 tablet (0.5 mg total) by mouth at bedtime. 01/12/17   Star Age, MD  doxazosin (CARDURA) 2 MG tablet Take 2 mg by mouth at bedtime.     [provider]  escitalopram (LEXAPRO) 10 MG tablet Take 1 tablet (10 mg total) by mouth at bedtime. 01/12/17   Star Age, MD  famotidine (PEPCID) 40 MG tablet Take 40 mg by mouth 2 (two) times daily. 7am 7 pm    [provider]  furosemide (LASIX) 20 MG tablet Take 20 mg by mouth 2 (two) times daily. 7 am, 7 pm    [provider]  gabapentin (NEURONTIN) 100 MG capsule Take 1 capsule (100 mg total) by mouth 3 (three) times daily. 01/12/17   Star Age, MD  metoprolol succinate (TOPROL-XL) 25 MG 24 hr tablet Take 25 mg by mouth every evening. 7 pm 04/16/16   [provider]  Multiple Vitamin (MULTIVITAMIN WITH MINERALS) TABS tablet Take 1 tablet by mouth daily. 7 am    [provider]  potassium chloride (K-DUR,KLOR-CON) 10 MEQ tablet Take 10 mEq by mouth daily. 7 am    [provider]  pravastatin (PRAVACHOL) 40 MG tablet Take 40 mg by mouth daily.     [provider]  sodium chloride (OCEAN) 0.65 % SOLN nasal spray Place 1 spray into both nostrils daily as needed for congestion.    [provider]  tamsulosin (FLOMAX) 0.4 MG CAPS capsule Take 0.4 mg at bedtime by mouth.    [provider]  warfarin (COUMADIN) 5 MG tablet TAKE AS DIRECTED BY COUMADIN CLINIC 01/07/17   Sueanne Margarita, MD    Family History Family History  Problem Relation Age of Onset  . Diabetes Mother        DM  . Stroke Father        CVA  . Pancreatic cancer Brother   . Pancreatic cancer Other        Nephew    Social History Social History   Tobacco Use  . Smoking status: Never Smoker  . Smokeless tobacco: Never Used  Substance Use Topics  . Alcohol  use: No    Alcohol/week: 0.0 oz  . Drug use: No     Allergies   Lisinopril   Review of Systems Review of Systems  Constitutional: Negative for activity change and appetite change.  Respiratory: Negative for cough and shortness of breath.   Cardiovascular: Negative for chest pain.  Gastrointestinal: Negative for abdominal pain.  Genitourinary: Negative for dysuria.  Musculoskeletal: Positive for arthralgias and myalgias.  Skin: Negative for rash.  Allergic/Immunologic: Negative for immunocompromised state.  Hematological: Bruises/bleeds easily.     Physical Exam Updated Vital Signs BP (!) 151/79   Pulse 72   Resp 15   SpO2 97%  Physical Exam  Constitutional: He is oriented to person, place, and time. He appears well-developed.  HENT:  Head: Atraumatic.  Neck: Neck supple.  Cardiovascular: Normal rate.  Pulmonary/Chest: Effort normal.  Abdominal: Soft.  Musculoskeletal: He exhibits edema, tenderness and deformity.  Patient has right elbow edema, tenderness to palpation.  Patient is also having mild right knee tenderness.  Patient is able to flex and extend his knee and apply significant pressure /force against my arm with his legs.  Patient is unable to fully extend his elbow and is having significant tenderness with extension of the elbow.  Neurological: He is alert and oriented to person, place, and time.  Skin: Skin is warm.  Nursing note and vitals reviewed.    ED Treatments / Results  Labs (all labs ordered are listed, but only abnormal results are displayed) Labs Reviewed  CBC WITH DIFFERENTIAL/PLATELET  PROTIME-INR  BASIC METABOLIC PANEL    EKG  EKG Interpretation None       Radiology Dg Elbow Complete Right  Result Date: 04/27/2017 CLINICAL DATA:  Right elbow pain, fall EXAM: RIGHT ELBOW - COMPLETE 3+ VIEW COMPARISON:  None. FINDINGS: No no dislocation is seen. Large amount of soft tissue swelling over the olecranon and posterior elbow. 15 mm  calcific opacity posterior to the distal humerus with irregular appearance of bony spur at the olecranon. IMPRESSION: 1. Large amount of soft tissue swelling over the olecranon and posterior elbow. 15 mm soft tissue calcification posterior to the distal humerus with irregular appearance of bony spur at the olecranon. Could consider avulsion injury of olecranon spur/triceps tendon given the significant amount of soft tissue swelling. If this is suspected clinically, MRI could be obtained for evaluation. Electronically Signed   By: Donavan Foil M.D.   On: 04/27/2017 03:43   Ct Head Wo Contrast  Result Date: 04/27/2017 CLINICAL DATA:  [place] [address] in bathroom. History of Parkinson's, hypertension, atrial fibrillation. EXAM: CT HEAD WITHOUT CONTRAST TECHNIQUE: Contiguous axial images were obtained from the base of the skull through the vertex without intravenous contrast. COMPARISON:  CT HEAD December 31, 2015 FINDINGS: BRAIN: No intraparenchymal hemorrhage, mass effect nor midline shift. Moderate ventriculomegaly disproportionate sulcal effacement at the convexities, however normal callosal angle and periventricular contour. Patchy to confluent supratentorial white matter hypodensities. No acute large vascular territory infarcts. No abnormal extra-axial fluid collections. Basal cisterns are patent. VASCULAR: Mild calcific atherosclerosis of the carotid siphons. SKULL: No skull fracture. No significant scalp soft tissue swelling. Small RIGHT frontal scalp lipoma. SINUSES/ORBITS: Trace paranasal sinus mucosal thickening without air-fluid levels. Mastoid air cells are well aerated. Soft tissue RIGHT external auditory canal compatible with cerumen. The included ocular globes and orbital contents are non-suspicious. Status post bilateral ocular lens implants. OTHER: None. IMPRESSION: 1. No acute intracranial process. 2. Moderate parenchymal brain volume loss, equivocal chronic communicating hydrocephalus. 3. Moderate chronic  small vessel ischemic disease. Electronically Signed   By: Elon Alas M.D.   On: 04/27/2017 03:59    Procedures Procedures (including critical care time)  Medications Ordered in ED Medications - No data to display   Initial Impression / Assessment and Plan / ED Course  I have reviewed the triage vital signs and the nursing notes.  Pertinent labs & imaging results that were available during my care of the patient were reviewed by me and considered in my medical decision making (see chart for details).     77 year old male history of A. fib on Coumadin and Parkinson's disorder with poor balance comes  in after mechanical fall at home.  Patient fell onto his right side and is noted to have right knee pain and right elbow pain.  The elbow is significantly tender on our exam, x-ray ordered and there is no fracture appreciated.  Patient might have a small avulsion fracture versus bone spur.  Patient has significant joint effusion of the right elbow, likely hemarthrosis because of trauma.  Patient will be placed in a sling for comfort reasons.  CT head ordered due to patient being on Coumadin and is negative.  Unfortunately, due to patient's history of Parkinson's disease he needs all 4 extremities to be able to be functional at home.  We will paid social work and case management to see him.  Dr. Sherry Ruffing to take over care at this time.  Labs are pending, if patient's INR is severely supratherapeutic, then he can get vitamin K while here..  Final Clinical Impressions(s) / ED Diagnoses   Final diagnoses:  Fall  Hemarthrosis of right elbow  Fall, initial encounter  Unable to ambulate    ED Discharge Orders    None       Varney Biles, MD 04/27/17 [phone]

## 2017-04-27 NOTE — Progress Notes (Addendum)
8:32am- CSW spoke with [place] from [facility] from [employer] Both informed CSW that pt would have to pay out of pocket for placement if coming to the facility. CSW informed family of this and they are understanding to potential [facility] services at this time. CSW to speak with RNCM about speaking with family at bedside.   8:02am-CSW has reached out to [facility] and [facility] per family request at this time. CSW has left VM's asking that they call CSW back at this time.  CSW spoke with pt and family at bedside. Per family, pt has had a fall recently and is now needing further assistance. CSW spoke with family about SNF  [facility] as well as [place] service. CSW was informed that pt has Medicare Part A and B as well as AARP as a supplement insurance. CSW explained barriers to placement with these two insurances. Family sought further details on placement being that pt has a number of medical conditions. CSW explained to family that more than likely facilities will not take pt without a three night qualifying stay, however they have requested that CSW try. CSW informed family that if SNF facilities are unable to take pt then CSW would speak with RNCM about [facility] options.     Virgie Dad Lanah Steines, MSW, [facility] Clinical Social Worker [phone]

## 2017-04-27 NOTE — Progress Notes (Signed)
CSW and RNCM spoke with Physical Therapy and was informed that pt may be appropriate for [facility]. RNCM has been in contact with [facility] and is arranging this service for pt at this time. Physical Therapy informed CSW that she would speak with family at bedside about this service. At this time pt is not requiring any further CSW interventions. CSW signing off.      [name], MSW, [facility] Clinical Social Worker [phone]

## 2017-04-27 NOTE — Evaluation (Signed)
Physical Therapy Evaluation Patient Details Name: Brian [name] MRN: [id number] DOB: [birth year] Today's Date: 04/27/2017   History of Present Illness  Pt brought to ED after fall at home.PMH - Parkinson's, HTN, bil TKR, afib  Clinical Impression  Pt presents to PT with significant decr in mobility after fall with rt elbow injury. Combination of pt's elbow injury and his Parkinson's has resulted in this decline. Expect pt will make good progress with PT and be able to return to higher level of function. Per case management pt unable to go to SNF and working on [employer].    Follow Up Recommendations SNF    Equipment Recommendations  None recommended by PT    Recommendations for Other Services       Precautions / Restrictions Precautions Precautions: Fall Restrictions Weight Bearing Restrictions: No      Mobility  Bed Mobility Overal bed mobility: Needs Assistance Bed Mobility: Supine to Sit;Sit to Supine     Supine to sit: Mod assist Sit to supine: Max assist   General bed mobility comments: assist to elevate trunk into sitting and bring hips to EOB. Assist to lower trunk and bring feet up into bed  Transfers Overall transfer level: Needs assistance Equipment used: Rolling walker (2 wheeled) Transfers: Sit to/from Stand Sit to Stand: Mod assist;+2 safety/equipment         General transfer comment: Assist to bring hips/trunk up. Pt with posterior bias. Verbal cues to place RUE on walker due to soreness. Verbal cues for nose over toes.  Ambulation/Gait Ambulation/Gait assistance: Min assist;+2 safety/equipment Ambulation Distance (Feet): 80 Feet Assistive device: Rolling walker (2 wheeled) Gait Pattern/deviations: Step-to pattern;Decreased step length - left;Decreased step length - right;Shuffle;Trunk flexed Gait velocity: decr Gait velocity interpretation: Below normal speed for age/gender General Gait Details: Assist for balance and support. Pt required  verbal cues of counting or to take "big steps" or step like you are "stepping over a ditch" to try to incr step length. Pt with incr difficulty with turning or backing up.  Stairs            Wheelchair Mobility    Modified Rankin (Stroke Patients Only)       Balance Overall balance assessment: Needs assistance Sitting-balance support: No upper extremity supported;Feet supported Sitting balance-Leahy Scale: Fair     Standing balance support: Bilateral upper extremity supported Standing balance-Leahy Scale: Poor Standing balance comment: walker and min assist for static standing                             Pertinent Vitals/Pain Pain Assessment: Faces Faces Pain Scale: Hurts a little bit Pain Location: RUE Pain Descriptors / Indicators: Grimacing;Guarding Pain Intervention(s): Limited activity within patient's tolerance;Monitored during session    Home Living Family/patient expects to be discharged to:: Private residence Living Arrangements: Spouse/significant other Available Help at Discharge: Family;Available PRN/intermittently Type of Home: House         Home Equipment: Walker - 2 wheels;Cane - single point;Transport chair(handles for commode)      Prior Function Level of Independence: Independent         Comments: Hasn't been using assistive device but has had frequent falls     Hand Dominance        Extremity/Trunk Assessment   Upper Extremity Assessment Upper Extremity Assessment: RUE deficits/detail RUE Deficits / Details: Limited by soreness in rt elbow after fall    Lower Extremity Assessment Lower Extremity  Assessment: Generalized weakness       Communication   Communication: No difficulties  Cognition Arousal/Alertness: Awake/alert Behavior During Therapy: WFL for tasks assessed/performed Overall Cognitive Status: Within Functional Limits for tasks assessed                                         General Comments      Exercises     Assessment/Plan    PT Assessment All further PT needs can be met in the next venue of care  PT Problem List Decreased strength;Decreased balance;Decreased mobility;Decreased knowledge of use of DME;Decreased activity tolerance;Decreased coordination       PT Treatment Interventions      PT Goals (Current goals can be found in the Care Plan section)  Acute Rehab PT Goals PT Goal Formulation: All assessment and education complete, DC therapy    Frequency     Barriers to discharge   wife works and stairs present at ;home    Co-evaluation               AM-PAC PT "6 Clicks" Daily Activity  Outcome Measure Difficulty turning over in bed (including adjusting bedclothes, sheets and blankets)?: Unable Difficulty moving from lying on back to sitting on the side of the bed? : Unable Difficulty sitting down on and standing up from a chair with arms (e.g., wheelchair, bedside commode, etc,.)?: Unable Help needed moving to and from a bed to chair (including a wheelchair)?: A Lot Help needed walking in hospital room?: A Lot Help needed climbing 3-5 steps with a railing? : A Lot 6 Click Score: 9    End of Session Equipment Utilized During Treatment: Gait belt Activity Tolerance: Patient tolerated treatment well Patient left: in bed;with call bell/phone within reach;with family/visitor present Nurse Communication: Mobility status PT Visit Diagnosis: Unsteadiness on feet (R26.81);Other abnormalities of gait and mobility (R26.89);Repeated falls (R29.6);Muscle weakness (generalized) (M62.81);Other symptoms and signs involving the nervous system (R29.898)    Time: 7543-6067 PT Time Calculation (min) (ACUTE ONLY): 35 min   Charges:   PT Evaluation $PT Eval Moderate Complexity: 1 Mod PT Treatments $Gait Training: 8-22 mins   PT G Codes:        [facility] PT [place] 04/27/2017, 2:25 PM

## 2017-04-27 NOTE — ED Provider Notes (Signed)
8:13 AM  Care assumed from Dr. Kathrynn Humble.  At time of transfer of care, patient is awaiting laboratory testing and assessment by the social work team to determine disposition.  Patient had a fall with right arm injury and head injury however CT head showed no acute intracranial abnormality and x-ray of the elbow showed soft tissue swelling and possible avulsion injury.  Patient was wrapped and will follow up with orthopedics for this.  Social work recommended physical therapy evaluation.  Physical therapy eval found patient needs likely SNF placement.  They did however find that patient qualified for and is appropriate for 24-hour home health.  Social work assisted in correct order placement for patient.  Patient and family agreed with plan discharge and patient discharged in good condition to follow-up with orthopedics and continue his outpatient management.  Patient and family understood return precautions for any new or worsened symptoms.  Patient discharged in good condition.   Clinical Impression: 1. Hemarthrosis of right elbow   2. Fall   3. Fall, initial encounter   4. Unable to ambulate     Disposition: Discharge  Condition: Good  I have discussed the results, Dx and Tx plan with the pt(& family if present). He/she/they expressed understanding and agree(s) with the plan. Discharge instructions discussed at great length. Strict return precautions discussed and pt &/or family have verbalized understanding of the instructions. No further questions at time of discharge.    Discharge Medication List as of 04/27/2017  7:04 PM      Follow Up: Renette Butters, MD [address] [place] [id number] [phone]  In 1 week      Dani Danis, Gwenyth Allegra, MD 04/27/17 2039

## 2017-04-27 NOTE — Discharge Instructions (Addendum)
We saw you in the ER after you had a fall. All the imaging results are normal, no fractures seen. No evidence of brain bleed. Please be very careful with walking, and do everything possible to prevent falls.  The elbow swelling is likely due to bloody effusion in your joint.  Please ice the elbow 3 or 4 times a day for 10 minutes.  You can start using heat pads to the elbow after 48 hours.  There is a small possibility of soft tissue injury or a small fracture that is based on the x-ray.  We recommend that you follow-up with orthopedist in 7-14 days.

## 2017-04-27 NOTE — Discharge Planning (Signed)
Vassie Kugel J. Madysun Thall, RN, BSN, NCM [phone] Spoke with pt at bedside regarding discharge planning for Home Health Services. Offered pt list of home health agencies to choose from.  Pt chose [facility] to render services. [facility] of [facility] notified. Patient made aware that [facility] will be in contact in 24-48 hours.  No DME needs identified at this time.  

## 2017-04-27 NOTE — ED Notes (Signed)
Confirming Corey Harold is coming

## 2017-04-27 NOTE — Consult Note (Signed)
   [facility] CM Inpatient Consult   04/27/2017  Brian [name] 04-26-1940 [id number]    Made aware that patient will enroll in the [place] program from [place] with [facility].  Advised to speak with patient's wife, [name].  Spoke with [name] and obtained verbal consent for potential [facility] Care Management services should transportation or pharmacy needs are identified while on the [place] program. Verbal consent obtained. Also discussed the potential that [place] Management could follow in the future if community case management needs are identified. [name] expresses agreement and understanding.  Notification sent to [place] Management team.   Marthenia Rolling, [place], RN,BSN [facility] Liaison [phone]

## 2017-04-27 NOTE — Discharge Planning (Signed)
EDCM spoke with [facility] agency to determine best resources for pt.  Will order PT consult to weigh in on decision.  EDCM spoke with pt at bedside regarding PT consult.

## 2017-04-27 NOTE — ED Triage Notes (Signed)
Pt BIB [facility] for fall. Pt is anticoagulated and fell in his bathroom. Denies LOC. Has abrasion and hematoma to right elbow. Family states that he has fallen 4-5 times in the last month.

## 2017-04-27 NOTE — ED Notes (Signed)
In to walk pt, but need another Therapist, sports or tech. Will find another person to help due to pt unstable walking.

## 2017-04-27 NOTE — ED Notes (Signed)
Pt stood at bedside with 2 staff member assist.  Pt unable to get out of bed without assistance, unable to push out of bed with RUE.

## 2017-04-28 DIAGNOSIS — N401 Enlarged prostate with lower urinary tract symptoms: Secondary | ICD-10-CM | POA: Diagnosis not present

## 2017-04-28 DIAGNOSIS — J984 Other disorders of lung: Secondary | ICD-10-CM | POA: Diagnosis not present

## 2017-04-28 DIAGNOSIS — G20 Parkinson's disease: Secondary | ICD-10-CM | POA: Diagnosis not present

## 2017-04-28 DIAGNOSIS — M25561 Pain in right knee: Secondary | ICD-10-CM | POA: Diagnosis not present

## 2017-04-28 DIAGNOSIS — N182 Chronic kidney disease, stage 2 (mild): Secondary | ICD-10-CM | POA: Diagnosis not present

## 2017-04-28 DIAGNOSIS — I129 Hypertensive chronic kidney disease with stage 1 through stage 4 chronic kidney disease, or unspecified chronic kidney disease: Secondary | ICD-10-CM | POA: Diagnosis not present

## 2017-04-28 DIAGNOSIS — I482 Chronic atrial fibrillation: Secondary | ICD-10-CM | POA: Diagnosis not present

## 2017-04-28 DIAGNOSIS — R339 Retention of urine, unspecified: Secondary | ICD-10-CM | POA: Diagnosis not present

## 2017-04-28 DIAGNOSIS — F419 Anxiety disorder, unspecified: Secondary | ICD-10-CM | POA: Diagnosis not present

## 2017-04-28 DIAGNOSIS — M25421 Effusion, right elbow: Secondary | ICD-10-CM | POA: Diagnosis not present

## 2017-04-28 DIAGNOSIS — R296 Repeated falls: Secondary | ICD-10-CM | POA: Diagnosis not present

## 2017-04-28 DIAGNOSIS — M25021 Hemarthrosis, right elbow: Secondary | ICD-10-CM | POA: Diagnosis not present

## 2017-04-29 DIAGNOSIS — G20 Parkinson's disease: Secondary | ICD-10-CM | POA: Diagnosis not present

## 2017-04-29 DIAGNOSIS — I129 Hypertensive chronic kidney disease with stage 1 through stage 4 chronic kidney disease, or unspecified chronic kidney disease: Secondary | ICD-10-CM | POA: Diagnosis not present

## 2017-04-30 DIAGNOSIS — G20 Parkinson's disease: Secondary | ICD-10-CM | POA: Diagnosis not present

## 2017-04-30 DIAGNOSIS — I129 Hypertensive chronic kidney disease with stage 1 through stage 4 chronic kidney disease, or unspecified chronic kidney disease: Secondary | ICD-10-CM | POA: Diagnosis not present

## 2017-05-04 DIAGNOSIS — G20 Parkinson's disease: Secondary | ICD-10-CM | POA: Diagnosis not present

## 2017-05-04 DIAGNOSIS — I129 Hypertensive chronic kidney disease with stage 1 through stage 4 chronic kidney disease, or unspecified chronic kidney disease: Secondary | ICD-10-CM | POA: Diagnosis not present

## 2017-05-05 DIAGNOSIS — I129 Hypertensive chronic kidney disease with stage 1 through stage 4 chronic kidney disease, or unspecified chronic kidney disease: Secondary | ICD-10-CM | POA: Diagnosis not present

## 2017-05-05 DIAGNOSIS — G8911 Acute pain due to trauma: Secondary | ICD-10-CM | POA: Diagnosis not present

## 2017-05-05 DIAGNOSIS — G20 Parkinson's disease: Secondary | ICD-10-CM | POA: Diagnosis not present

## 2017-05-05 DIAGNOSIS — S6980XA Other specified injuries of unspecified wrist, hand and finger(s), initial encounter: Secondary | ICD-10-CM | POA: Diagnosis not present

## 2017-05-05 DIAGNOSIS — M25521 Pain in right elbow: Secondary | ICD-10-CM | POA: Diagnosis not present

## 2017-05-06 ENCOUNTER — Ambulatory Visit (INDEPENDENT_AMBULATORY_CARE_PROVIDER_SITE_OTHER): Payer: Medicare Other | Admitting: Cardiovascular Disease

## 2017-05-06 ENCOUNTER — Telehealth: Payer: Self-pay | Admitting: *Deleted

## 2017-05-06 DIAGNOSIS — G20 Parkinson's disease: Secondary | ICD-10-CM | POA: Diagnosis not present

## 2017-05-06 DIAGNOSIS — Z5181 Encounter for therapeutic drug level monitoring: Secondary | ICD-10-CM | POA: Diagnosis not present

## 2017-05-06 DIAGNOSIS — I129 Hypertensive chronic kidney disease with stage 1 through stage 4 chronic kidney disease, or unspecified chronic kidney disease: Secondary | ICD-10-CM | POA: Diagnosis not present

## 2017-05-06 DIAGNOSIS — I4891 Unspecified atrial fibrillation: Secondary | ICD-10-CM

## 2017-05-06 LAB — POCT INR: INR: 3

## 2017-05-06 NOTE — Telephone Encounter (Signed)
Received voicemail from Ursa she stated she wanted to get a verbal order to check a pt's INR today but she already has obtained the INR without an order while she was there. On the VM, she stated that the pt had a recent fall & has a bruise on right elbow & it looks larger. P was checked out in the ER on 04/27/17 for the fall.  Returned call to the RN & instructed her to always obtain an order to get an INR & she verbalized understanding.  She stated tha pt is having a decline in health & he can't get around the house & requiring 2 person assist.  Also, pt may need surgery on leg, will see ortho next week & she will update [place]. Since pt has a decline in health she will recheck pt in 2 weeks & call INR to [place] or if he has to go to a Assisted Living versus Long term care versus surgery she will update [place].  Please refer to Anticoagulation Track for INR results from today.

## 2017-05-07 DIAGNOSIS — G20 Parkinson's disease: Secondary | ICD-10-CM | POA: Diagnosis not present

## 2017-05-07 DIAGNOSIS — I129 Hypertensive chronic kidney disease with stage 1 through stage 4 chronic kidney disease, or unspecified chronic kidney disease: Secondary | ICD-10-CM | POA: Diagnosis not present

## 2017-05-08 DIAGNOSIS — G20 Parkinson's disease: Secondary | ICD-10-CM | POA: Diagnosis not present

## 2017-05-08 DIAGNOSIS — I129 Hypertensive chronic kidney disease with stage 1 through stage 4 chronic kidney disease, or unspecified chronic kidney disease: Secondary | ICD-10-CM | POA: Diagnosis not present

## 2017-05-11 DIAGNOSIS — I129 Hypertensive chronic kidney disease with stage 1 through stage 4 chronic kidney disease, or unspecified chronic kidney disease: Secondary | ICD-10-CM | POA: Diagnosis not present

## 2017-05-11 DIAGNOSIS — G20 Parkinson's disease: Secondary | ICD-10-CM | POA: Diagnosis not present

## 2017-05-12 DIAGNOSIS — M66821 Spontaneous rupture of other tendons, right upper arm: Secondary | ICD-10-CM | POA: Diagnosis not present

## 2017-05-12 DIAGNOSIS — M25521 Pain in right elbow: Secondary | ICD-10-CM | POA: Diagnosis not present

## 2017-05-13 ENCOUNTER — Telehealth: Payer: Self-pay | Admitting: *Deleted

## 2017-05-13 ENCOUNTER — Telehealth: Payer: Self-pay

## 2017-05-13 DIAGNOSIS — I129 Hypertensive chronic kidney disease with stage 1 through stage 4 chronic kidney disease, or unspecified chronic kidney disease: Secondary | ICD-10-CM | POA: Diagnosis not present

## 2017-05-13 DIAGNOSIS — G20 Parkinson's disease: Secondary | ICD-10-CM | POA: Diagnosis not present

## 2017-05-13 NOTE — Telephone Encounter (Signed)
Received a voicemail from the pt's wife & she stated the pt was going to have a procedure on his arm after sustaining a fall. They date was not on the voicemail but she stated the Doctor would be calling regarding this to send a clearance form. Left message for wife to call back so we can obtain procedure date & ensure we get clearance from Cardiologist.

## 2017-05-13 NOTE — Telephone Encounter (Signed)
   [facility] Pre-operative Risk Assessment    Request for surgical clearance:  1. What type of surgery is being performed? Right Elbow: Right elbow triceps tendon repair   2. When is this surgery scheduled? Tentatively on 05/20/17   3. What type of clearance is required (medical clearance vs. Pharmacy clearance to hold med vs. Both)? Medical  4. Are there any medications that need to be held prior to surgery and how long? None listed   5. Practice name and name of physician performing surgery? [facility]/Dr. Iran Planas   6. What is your office phone and fax number? Phone [phone] Fax [phone]   7. Anesthesia type (None, local, MAC, general) ? General   Brian [name] 05/13/2017, 3:27 PM  _________________________________________________________________   (provider comments below)

## 2017-05-14 NOTE — Telephone Encounter (Signed)
LMOM for spouse and noted that pre-op surgical clearance note is in chart and sent to Pre-op department on yesterday.

## 2017-05-14 NOTE — Telephone Encounter (Addendum)
   Primary Cardiologist: Fransico Him, MD  Chart reviewed as part of pre-operative protocol coverage. Patient was contacted 05/14/2017 in reference to pre-operative risk assessment for pending surgery as outlined below.  DANIS PEMBLETON was last seen on 12/23/2016 by Dr Radford Pax.    Since that day, NKOSI CORTRIGHT has been at his baseline from a cardiac standpoint.  His general medical condition has deteriorated and after his fall on 04/27/2016, he has been able to do even less.  He has injured his triceps and is not able to use that arm.  He requires 3 person assist to do 1 or 2 stairs.  His wife is having to get help in daily to manage them.  He also has HHRN that checks on him.   Spoke with Dr. Radford Pax who feels that based on ACC/AHA guidelines, the patient would be at increased but acceptable risk for the planned procedure without further cardiovascular testing.   Spoke with the pharmacist. CHA2DS2VASc= 2 (age x 1, HTN), so he can hold the Coumadin for 5 days prior to the planned procedure.  He has never had an event, so Lovenox bridging is not indicated.  I will route this recommendation to the requesting party via Epic fax function and remove from pre-op pool.  Please call with questions.  Rosaria Ferries, PA-C 05/14/2017, 3:58 PM

## 2017-05-15 ENCOUNTER — Telehealth: Payer: Self-pay | Admitting: Cardiology

## 2017-05-15 DIAGNOSIS — I129 Hypertensive chronic kidney disease with stage 1 through stage 4 chronic kidney disease, or unspecified chronic kidney disease: Secondary | ICD-10-CM | POA: Diagnosis not present

## 2017-05-15 DIAGNOSIS — G20 Parkinson's disease: Secondary | ICD-10-CM | POA: Diagnosis not present

## 2017-05-15 NOTE — Telephone Encounter (Signed)
New message     Patient spouse calling back for preop  Clarification. Please call

## 2017-05-15 NOTE — H&P (Signed)
Brian [name] is an 77 y.o. male.   Chief Complaint: RIGHT ELBOW TRICEPS TENDON TEAR  HPI: Brian [name] is a 77 year old right-hand dominant male who sustained an injury to the right arm on 04/27/17. He has had continuous weakness and swelling in the arm since the injury.  The patient had a MRI of the right elbow that showed a right elbow triceps tendon tear.  Discussed the reason and rationale for surgery, the surgical procedure, including the risks versus benefits, and the post-operative recovery.  The patient denies chest pain, shortness of breath, fever, numbness, nausea, vomiting, or diarrhea.  The patient is here today for surgery.   Past Medical History:  Diagnosis Date  . Abnormal PFT 1. 05/18/08  2. 11/30/08   1. Showed mild airflow obstruction, mild restriction, mild diffusion defect; FEV1 2.22(64%), FVC 3.33(65%), FEVi% 67, TLC 5.19(69%), DLCO 77%, +BD  2. FEV1 2.38(73%), FVC 3.81(80%), FEV1% 63, TLC 5.61(80%), DLCO 79%, no BD  . Allergic rhinitis   . Anxiety   . BPH (benign prostatic hyperplasia)   . Cellulitis    right leg MRSA  . Erectile dysfunction   . GERD (gastroesophageal reflux disease)   . Heart murmur    mild MR by echo  . Hyperlipidemia   . Hypertension   . Osteoarthritis   . Paresthesias 06/22/2012  . Parkinsonism ([place]) 06/22/2012  . Permanent atrial fibrillation ([place])   . Restrictive lung disease     Past Surgical History:  Procedure Laterality Date  . CARDIAC CATHETERIZATION  11/2009   normal coronary arteries  . CARDIOVERSION     multiple  . CATARACT EXTRACTION    . COLONOSCOPY    . COLONOSCOPY WITH PROPOFOL N/A 09/02/2016   Procedure: COLONOSCOPY WITH PROPOFOL;  Surgeon: Brian Fair, MD;  Location: [facility];  Service: Endoscopy;  Laterality: N/A;  . EYE SURGERY     bilateral cataract extraction  . JOINT REPLACEMENT    . TOTAL KNEE ARTHROPLASTY Left 11/30/2014   Procedure: TOTAL KNEE ARTHROPLASTY;  Surgeon: Brian Day, MD;  Location: [facility];   Service: Orthopedics;  Laterality: Left;  . TOTAL KNEE ARTHROPLASTY Right 07/12/2015   Procedure: RIGHT TOTAL KNEE ARTHROPLASTY;  Surgeon: Brian Day, MD;  Location: [facility];  Service: Orthopedics;  Laterality: Right;    Family History  Problem Relation Age of Onset  . Diabetes Mother        DM  . Stroke Father        CVA  . Pancreatic cancer Brother   . Pancreatic cancer Other        Nephew   Social History:  reports that  has never smoked. he has never used smokeless tobacco. He reports that he does not drink alcohol or use drugs.  Allergies:  Allergies  Allergen Reactions  . Lisinopril Cough    No medications prior to admission.    No results found for this or any previous visit (from the past 48 hour(s)). No results found.  ROS NO RECENT ILLNESSES OR HOSPITALIZATIONS  There were no vitals taken for this visit. Physical Exam  General Appearance:  Alert, cooperative, no distress, appears stated age  Head:  Normocephalic, without obvious abnormality, atraumatic  Eyes:  Pupils equal, conjunctiva/corneas clear,         Throat: Lips, mucosa, and tongue normal; teeth and gums normal  Neck: No visible masses     Lungs:   respirations unlabored  Chest Wall:  No tenderness or deformity  Heart:  Regular  rate and rhythm,  Abdomen:   Soft, non-tender,         Extremities: RUE: Skin intact, fingers warm well perfused Able to extend thumb and cross fingers  Pulses: 2+ and symmetric  Skin: Skin color, texture, turgor normal, no rashes or lesions     Neurologic: Normal    Assessment RIGHT ELBOW TRICEPS TENDON TEAR  Plan RIGHT ELBOW TRICEPS TENDON REPAIR  R/B/A DISCUSSED WITH PT IN OFFICE.  PT VOICED UNDERSTANDING OF PLAN CONSENT SIGNED [name] OF SURGERY PT SEEN AND EXAMINED PRIOR TO OPERATIVE PROCEDURE/[name] OF SURGERY SITE MARKED. QUESTIONS ANSWERED WILL REMAIN AN INPATIENT FOLLOWING SURGERY  WE ARE PLANNING SURGERY FOR YOUR UPPER EXTREMITY. THE RISKS AND BENEFITS  OF SURGERY INCLUDE BUT NOT LIMITED TO BLEEDING INFECTION, DAMAGE TO NEARBY NERVES ARTERIES TENDONS, FAILURE OF SURGERY TO ACCOMPLISH ITS INTENDED GOALS, PERSISTENT SYMPTOMS AND NEED FOR FURTHER SURGICAL INTERVENTION. WITH THIS IN MIND WE WILL PROCEED. I HAVE DISCUSSED WITH THE PATIENT THE PRE AND POSTOPERATIVE REGIMEN AND THE DOS AND DON'TS. PT VOICED UNDERSTANDING AND INFORMED CONSENT SIGNED.   Brian [name] 05/15/2017, 11:47 AM

## 2017-05-15 NOTE — Telephone Encounter (Signed)
Left message for wife that all information was sent to Dr Caralyn Guile for clearance.  She may call back Monday if further questions

## 2017-05-18 ENCOUNTER — Encounter: Payer: Self-pay | Admitting: Neurology

## 2017-05-18 ENCOUNTER — Ambulatory Visit (INDEPENDENT_AMBULATORY_CARE_PROVIDER_SITE_OTHER): Payer: Medicare Other | Admitting: Neurology

## 2017-05-18 VITALS — BP 118/78 | HR 81

## 2017-05-18 DIAGNOSIS — G20 Parkinson's disease: Secondary | ICD-10-CM | POA: Diagnosis not present

## 2017-05-18 DIAGNOSIS — W19XXXS Unspecified fall, sequela: Secondary | ICD-10-CM

## 2017-05-18 DIAGNOSIS — I129 Hypertensive chronic kidney disease with stage 1 through stage 4 chronic kidney disease, or unspecified chronic kidney disease: Secondary | ICD-10-CM | POA: Diagnosis not present

## 2017-05-18 NOTE — Telephone Encounter (Signed)
lvm

## 2017-05-18 NOTE — Patient Instructions (Signed)
All the best with your surgery.  Please keep your medications the same. We will do a 3 month follow up.

## 2017-05-18 NOTE — Progress Notes (Signed)
Subjective:    Patient ID: AMAIR SHROUT is a 77 y.o. male.  HPI     Interim history:  Mr. Winterton is a 77 year old right-handed gentleman with an underlying medical history of atrial fibrillation, hypertension, OSA, and hyperlipidemia who presents for followup consultation of his right-sided predominant Parkinson's disease complicated by bladder hyperactivity, anxiety , mobility limitations secondary to arthritis, and RBD. He is accompanied by his wife again today. I last saw him on work with a Clinical research associate. He had 2 falls in the recent past. He was not always using his walker in the bathroom. He did endorse increase in stiffness and slowness, with some freezing reported. I suggested he continue with his medication regimen. We talked about fall risk and the importance of exercising on a regular basis.  Today, 05/15/2017 (all dictated new, as well as above notes, some dictation done in note pad or Word, outside of chart, may appear as copied):    He reports having had a fall earlier this month and he injured his right elbow and bumped his head. He fell in the bathroom. He had a ER visit which I reviewed: He had a head CT without contrast on 04/27/2017 which showed: IMPRESSION: 1. No acute intracranial process. 2. Moderate parenchymal brain volume loss, equivocal chronic communicating hydrocephalus. 3. Moderate chronic small vessel ischemic disease.  He will need surgery to his right arm, planned for 05/20/2017 under Dr. Apolonio Schneiders.  He had a Xray R elbow on 04/27/17, which I review: IMPRESSION: 1. Large amount of soft tissue swelling over the olecranon and posterior elbow. 15 mm soft tissue calcification posterior to the distal humerus with irregular appearance of bony spur at the olecranon. Could consider avulsion injury of olecranon spur/triceps tendon given the significant amount of soft tissue swelling. If this is suspected clinically, MRI could be obtained for evaluation.  Per wife, this is the  fifth fall this year. He has a higher risk of falls at night in the bathroom. Sometimes he walks without shoes on. Sometimes he does not use his walker at night. Since his last fall they have some help at the house.    The patient's allergies, current medications, family history, past medical history, past social history, past surgical history and problem list were reviewed and updated as appropriate.    Previously (copied from previous notes for reference):    I saw him on 07/09/2016, at which time he was fairly stable.      I saw him on 03/06/2016 at which time he reported doing better, he had some residual right knee pain. He had an appointment with his orthopedic surgeon. He reported one fall that led to an ER visit via EMS. He also had an admission to the hospital in October 2017 secondary to nausea, vomiting, urinary retention. He was noted to have altered mental status and acute kidney injury. He had a head CT without contrast on 12/31/2015 which showed:    1.  No acute intracranial abnormality. 2. Mild increase in generalized atrophy from 2013. Chronic small vessel ischemia is stable.   He was in the hospital for 4 days, then at [facility] in rehabilitation for about 10 days. He has ongoing issues with particularly right leg stiffness, no additional falls, does not appear to be very motivated to exercise. He had been in the PD boxing class. I suggested we continue with Sinemet 2 pills 4 times a day, Sinemet CR at bedtime, gabapentin, clonazepam for RBD at night and the only change  was an increase in Lexapro from 5 mg to 10 mg daily.   I saw him on 10/22/2015, at which time he reported worse mobility, more stiffness in the right leg, more freezing episodes, difficulty getting out of bed especially at night when he would have to go to the bathroom. His wife would have to help him. Thankfully, he had no recent falls and was using a cane. He had finished physical therapy was not using his  walker. He was not exercising very much to. He was more sedentary. He had right total knee replacement on 07/12/2015. I suggested we increase his Sinemet to 2 pills 4 times a day. We kept his gabapentin the same. We kept the Sinemet CR at bedtime the same.   I saw him on 04/26/2015, at which time he reported taking carbidopa/levodopa 25-100 milligrams strength, about 7 pills per day, 2 for the 1st dose, 1 1/2 pills for the 2nd and 4 th dose, 2 for the 3rd dose, and 1 CR at night. He finished physical therapy and was able to walk without a cane. He reported right knee pain and indicated that he may need right knee surgery as well.   I saw him on 01/18/2015, at which time he reported doing better slowly. He had finished home health physical therapy. He was supposed to start outpatient physical therapy through orthopedics. He had left total knee arthroplasty on 11/30/2014. I reviewed the operative note as well as his discharge summary. He was discharged on 12/03/2014 to inpatient rehabilitation. Of note, he presented to the emergency room on 12/16/2014 secondary to repeated vomiting. This was deemed secondary to constipation. Cardiac enzymes were tested which were negative for any acute coronary syndrome. I reviewed the emergency room records. He had blood work which I reviewed. INR was 2.59. Hemoglobin was slightly low at 11.9 and hematocrit was 34.6. Troponin was negative 2. He overall felt a little better since we increased his Sinemet. He was taking a long-acting Sinemet CR at night. He had some off time in the middle of the night.  He felt he was making slow progress after his knee replacement surgery. He had some issues with edema. He was wearing compression stockings for this. He was also placed on furosemide for a little while. He was no longer on narcotic pain medications. He was using a cane at the time. I suggested we continue with his medication regimen, he was alternating Sinemet 2 pills with 1-1/2  pills for a total of 4 doses a day.   He missed an appointment on 12/19/2014. I saw him on 08/22/2014, at which time he reported bilateral knee pain. He had seen Dr. Tonita Cong for his knee arthritis and had undergone injections into both knees. He did not think the injections helped a lot. He was exploring knee replacement surgery. He was having more difficulty getting out of chairs. He had no freezing. He did think that the increase in Sinemet had helped in the past. He was alternating 1 pill with 1-1/2 pills. He was taking Sinemet CR at night. He was on gabapentin 100 mg 3 times a day and Lexapro 5 mg daily as well as clonazepam 0.5 mg each night. He had no new memory or mood issues. He was overall moving slower. He had no recent falls.   In the interim, his wife called on 01/04/2015 reported that he had worsening symptoms. I suggested we increase his Sinemet slightly to 2 tablets alternating with 1-1/2 tablets.   I  saw him on 06/01/2014, at which time he reported overall doing well. He was not able to sleep through the night. He was consistently waking up between 3 and 4 AM. He had nocturia twice on average. He felt improved from the bladder medication but still reported disrupted sleep. He felt clonazepam has helped. His wife agreed. He also felt that the increase in Sinemet was helpful in his motor function. It was difficult for him to keep up with the 5 pills a day schedule. He was going to the [facility] 3 times a week for about 30 minutes. Lexapro has helped his mood. His wife felt that he was less anxious. I suggested we increase his clonazepam to 0.5 mg each night. I suggested we change his Sinemet to 1 pill alternating with one half pills for a total of 5 pills daily but for doses. I suggested he continue with Sinemet CR at bedtime.   I saw him on 03/27/2014, at which time he reported sleeping a little bit better but his tremor was worse. He did not feel Sinemet was lasting him 4 hours in between 2  different doses. He was going to the gym twice a week. He was back on an antidepressant. He was supposed to start a new bladder medication. He never actually started clonazepam for RBD. I prescribed clonazepam for him for RBD. I increased his Sinemet to one fill 5 times a day.   I saw him on 12/01/2013, at which time he reported, that he stopped Myrbetric due to new onset facial swelling, but this was no better after stopping it. His facial swelling may have started after the lexapro. Adding long-acting Sinemet helped him sleep a little bit longer up to 3 AM. I suggested he stop Lexapro because of a possible allergic reaction to it. I also added low-dose clonazepam for his RBD and for his sleep.   I saw him on 09/29/2013 at which time he presented for a sooner than scheduled appointment because of increasing tremors and parkinsonian symptoms. He also felt more anxious but not frankly depressed. His wife felt that he may have been depressed. She reported dream enactments. She also reported loud snoring. He had a sleep study over 5 years ago, and that was negative for OSA at the time. He had started Myrbetriq recently. He has been taking gabapentin 3 times a day, and 7, 11 and 3. He takes Sinemet regularly at 7, 11, 3, and 7. I suggested that he start low-dose Lexapro at 5 mg strength. I changed the timing of his gabapentin to one pill at 7 AM, 1 pill at 11 AM and one pill at 7 PM. I asked him to continue with Sinemet 4 times a day. I asked him to add a long-acting Sinemet CR 50/200 mg strength once daily at bed time. I suggested he return for a sleep study. He had a diagnostic polysomnogram on 11/09/2013 and I went over his test results with him in detail today. Sleep efficiency was reduced at 58.7% with a latency to sleep of 82 minutes and wake after sleep onset of 117 minutes with mild to moderate sleep fragmentation noted. He had an increased percentage of light stage sleep, absence of deep sleep and 16.8% of  dream sleep with a prolonged REM latency. He had mild to moderate snoring. Total AHI was 3.2 per hour, he had some lack of REM atonia.   I saw him on 05/31/2013, at which time I felt he was fairly stable. I  kept him on Sinemet 4 times a day and gabapentin 100 mg 3 times a day. He reported, going up to 4 times a day with his Sinemet helped. He reported no side effects with gabapentin or Sinemet and seemed to tolerate them well. He had some residual facial tingling which was tolerable to him. He has LBP and went to [facility]. He had an Xray of the back and was told there was degenerative disease and was given and was given a 3 week taper of oral steroids, which helped. He had stopped exercising d/t back pain and was scheduled for PT evaluation through ortho. He has been on metamucil for constipation which helped.   I saw him on 11/30/2012, at which time I suggested that he continue gabapentin 100 mg 1 pill 3 times a day for his paresthesias and encouraged him to continue Sinemet with 25/100 mg strength one tablet 4 times a day, at 7, 11, 3 PM and 7 PM.     I first met him on 06/22/2012 and he presented on 08/23/2012 for a sooner than scheduled appointment because he felt his medication was not helpful. He previously followed with Dr. Morene Antu and had been complaining of paresthesias. Dr. Erling Cruz had started him on low-dose gabapentin. For his parkinsonism which was first noted in June of 2012 he was tried on pramipexole which helped, but at his first visit with me he told me that his gabapentin was not helpful and he also discontinued pramipexole a few months prior because of swelling in his feet and ankles. This improved after stopping both medications. At the time of his first visit with me I felt he had mild parkinsonism without much in the way of lateralization. I started him on gabapentin again because of his paresthesias and also asked him to start low-dose Sinemet. He called back stating that the  Sinemet was not helpful. He requested a sooner appointment. He had been continuing taking gabapentin 100 mg 3 times a day and Sinemet one pill 3 times a day with minimal improvement as he reported last time. I suggested at that visit that he increase his Sinemet to one pill 4 times a day and continue with gabapentin 100 mg 3 times a day. I also felt that he may have right sided predominant idiopathic Parkinson's disease due to an intermittent tremor noted only on the right side.    His Past Medical History Is Significant For: Past Medical History:  Diagnosis Date  . Abnormal PFT 1. 05/18/08  2. 11/30/08   1. Showed mild airflow obstruction, mild restriction, mild diffusion defect; FEV1 2.22(64%), FVC 3.33(65%), FEVi% 67, TLC 5.19(69%), DLCO 77%, +BD  2. FEV1 2.38(73%), FVC 3.81(80%), FEV1% 63, TLC 5.61(80%), DLCO 79%, no BD  . Allergic rhinitis   . Anxiety   . BPH (benign prostatic hyperplasia)   . Cellulitis    right leg MRSA  . Erectile dysfunction   . GERD (gastroesophageal reflux disease)   . Heart murmur    mild MR by echo  . Hyperlipidemia   . Hypertension   . Osteoarthritis   . Paresthesias 06/22/2012  . Parkinsonism ([place]) 06/22/2012  . Permanent atrial fibrillation ([place])   . Restrictive lung disease     His Past Surgical History Is Significant For: Past Surgical History:  Procedure Laterality Date  . CARDIAC CATHETERIZATION  11/2009   normal coronary arteries  . CARDIOVERSION     multiple  . CATARACT EXTRACTION    . COLONOSCOPY    .  COLONOSCOPY WITH PROPOFOL N/A 09/02/2016   Procedure: COLONOSCOPY WITH PROPOFOL;  Surgeon: Garlan Fair, MD;  Location: [facility] END[facility]COPY;  Service: Endoscopy;  Laterality: N/A;  . EYE SURGERY     bilateral cataract extraction  . JOINT REPLACEMENT    . TOTAL KNEE ARTHROPLASTY Left 11/30/2014   Procedure: TOTAL KNEE ARTHROPLASTY;  Surgeon: Susa Day, MD;  Location: [facility] ORS;  Service: Orthopedics;  Laterality: Left;  . TOTAL KNEE ARTHROPLASTY Right  07/12/2015   Procedure: RIGHT TOTAL KNEE ARTHROPLASTY;  Surgeon: Susa Day, MD;  Location: [facility] OR[facility];  Service: Orthopedics;  Laterality: Right;    His Family History Is Significant For: Family History  Problem Relation Age of Onset  . Diabetes Mother        DM  . Stroke Father        CVA  . Pancreatic cancer Brother   . Pancreatic cancer Other        Nephew    His Social History Is Significant For: Social History   Socioeconomic History  . Marital status: Married    Spouse name: [name]  . Number of children: 4  . Years of education: masters  . Highest education level: None  Social Needs  . Financial resource strain: None  . Food insecurity - worry: None  . Food insecurity - inability: None  . Transportation needs - medical: None  . Transportation needs - non-medical: None  Occupational History  . Occupation: Chief of Staff    Comment: Worked with [employer]  Tobacco Use  . Smoking status: Never Smoker  . Smokeless tobacco: Never Used  Substance and Sexual Activity  . Alcohol use: No    Alcohol/week: 0.0 oz  . Drug use: No  . Sexual activity: None  Other Topics Concern  . None  Social History Narrative   Married and lives in [place].  Retired Optometrist.   No reported caffeine use.     His Allergies Are:  Allergies  Allergen Reactions  . Lisinopril Cough  :   His Current Medications Are:  Outpatient Encounter Medications as of 05/18/2017  Medication Sig  . ammonium lactate (LAC-HYDRIN) 12 % cream Apply topically as needed for dry skin. As prescribed (Patient taking differently: Apply 1 g topically as needed for dry skin. As prescribed)  . carbidopa-levodopa (SINEMET CR) 50-200 MG tablet Take 1 tablet by mouth at bedtime.  . carbidopa-levodopa (SINEMET IR) 25-100 MG tablet Take 2 tablets by mouth 4 (four) times daily. (Patient taking differently: Take 2 tablets by mouth 4 (four) times daily. 7AM, 11AM, 3PM, 7PM)  . cholecalciferol (VITAMIN D) 1000  units tablet Take 1,000 Units by mouth daily. 7 am  . clonazePAM (KLONOPIN) 0.5 MG disintegrating tablet Take 1 tablet (0.5 mg total) by mouth at bedtime.  Marland Kitchen doxazosin (CARDURA) 2 MG tablet Take 2 mg by mouth at bedtime.   Marland Kitchen escitalopram (LEXAPRO) 10 MG tablet Take 1 tablet (10 mg total) by mouth at bedtime.  . famotidine (PEPCID) 40 MG tablet Take 40 mg by mouth every evening. 7 pm  . furosemide (LASIX) 20 MG tablet Take 20 mg by mouth 2 (two) times daily. 7 am, 7 pm  . gabapentin (NEURONTIN) 100 MG capsule Take 1 capsule (100 mg total) by mouth 3 (three) times daily. (Patient taking differently: Take 100 mg by mouth 3 (three) times daily. 7AM, 3PM, 7PM)  . losartan (COZAAR) 50 MG tablet Take 50 mg by mouth every morning.  . metoprolol succinate (TOPROL-XL) 25 MG 24 hr  tablet Take 25 mg by mouth every evening. 7 pm  . Multiple Vitamin (MULTIVITAMIN WITH MINERALS) TABS tablet Take 1 tablet by mouth daily. 7 am  . potassium chloride (K-DUR,KLOR-CON) 10 MEQ tablet Take 10 mEq by mouth 2 (two) times daily.   . pravastatin (PRAVACHOL) 40 MG tablet Take 40 mg by mouth every morning.   . sodium chloride (OCEAN) 0.65 % SOLN nasal spray Place 1 spray into both nostrils daily as needed for congestion.  . tamsulosin (FLOMAX) 0.4 MG CAPS capsule Take 0.4 mg at bedtime by mouth.  . vitamin C (ASCORBIC ACID) 500 MG tablet Take 500 mg by mouth every morning.  . warfarin (COUMADIN) 5 MG tablet TAKE AS DIRECTED BY COUMADIN CLINIC (Patient taking differently: Take 7.5 mg by mouth daily on Wednesday and Saturday. Take 5 mg by mouth daily on all other days)   No facility-administered encounter medications on file as of 05/18/2017.   :  Review of Systems:  Out of a complete 14 point review of systems, all are reviewed and negative with the exception of these symptoms as listed below:  Review of Systems  Neurological:       Pt presents today to follow up on his PD. Pt reports that he had a fall earlier this month  and will require a surgery to fix his tricep.    Objective:  Neurological Exam  Physical Exam Physical Examination:   Vitals:   05/18/17 0813  BP: 118/78  Pulse: 81    General Examination: The patient is a very pleasant 77 y.o. male in no acute distress. He appears frail. Well groomed.   HEENT:Normocephalic, atraumatic, pupils are equal, round and reactive to light and accommodation. s/p cataract repairs. Extraocular tracking shows moderate saccadic breakdown. Moderate to severe nuclear rigidity is noted. No lip, neck or jaw tremor is noted. He has mod hypophonia, no dysarthria noted. Oropharynx examination reveals no new findings, except moderate mouth dryness. Tongue is central, palate symmetrical.   Chest:Clear to auscultation without wheezing, rhonchi or crackles noted.  Heart:S1+S2+0, regular and normal without murmurs, rubs or gallops noted.   Abdomen:Soft, non-tender and non-distended with normal bowel sounds appreciated on auscultation.  Extremities:There is trace edema in the distal lower extremities bilaterally.   Skin: Warm and dry without trophic changes noted.  Musculoskeletal: exam reveals no obvious joint deformities, tenderness or joint swelling or erythema, healing/healed scars from TKAs b/l. Limited ROM R arm.  Neurologically:  Mental status: The patient is awake, alert and oriented in all 4 spheres. Hisimmediate and remote memory, attention, language skills and fund of knowledge are appropriate. There is no evidence of aphasia, agnosia, apraxia or anomia. Speech is clear with normal prosody and enunciation. Thought process is linear. Mood is normaland affect is normal.  Cranial nerves II - XII are as described above under HEENT exam. Motor exam: Normal bulk, global strength of 4+/5, No resting tremor or rebound. Romberg is not tested for safety. Reflexes are not tested today. Fine motor exam shows severe impairment on the right, moderate impairment  on the left. Limited movements R elbow.  Cerebellar testing: No dysmetria or intention tremor.  Sensory exam: intact to light touch in the upper and lower extremities.  Gait, station and balance: I did not have him stand or walk for me today. He is in a wheelchair.   Assessment and Plan:   In summary, KAIDEN PECH a very pleasant 77 year old male with a history of atrial fibrillation, hypertension, hyperlipidemia, who  presents for followup consultation of his right-sided predominant Parkinson's disease, most likely akinetic-rigid type, complicated by anxiety, mild depression, sleep disorder, including sleep fragmentation, nocturia, insomnia, obesity, arthritis, and RBD and recent status post left (2016), then right (2017)total knee replacement surgeries and recent falls, most recently earlier this month with injury to the right elbow and triceps tendon, which will require surgery. He has had progression with time. No significant mood disorder or hallucinations or delusions, no significant constipation, but we have previously discussed extensively the challenges of advancing Parkinson's disease and fall risk. His wife has reported that they will be looking into moving to a single level home by this year. He may need a longer inpatient stay after his right arm surgery given his complicated history including A. fib and advancing Parkinson's disease. He would benefit in my opinion from skilled nursing facility placement for rehabilitation after surgery. Medication wise, I suggested we continue with the current medication regimen and timing. I suggested follow-up in 3 months. We takes Sinemet IR 2 pills 4 times a day, and the CR at night. We increased the lexapro generic to 10 mg in Dec. 2017. for his RBD he has been on low-dose clonazepam. He did not need any refills today. I answered all their questions and patient and his wife were in agreement.  I spent 20 minutes in total face-to-face time with the  patient, more than 50% of which was spent in counseling and coordination of care, reviewing test results, reviewing medication and discussing or reviewing the diagnosis of PD, its prognosis and treatment options. Pertinent laboratory and imaging test results that were available during this visit with the patient were reviewed by me and considered in my medical decision making (see chart for details).

## 2017-05-19 ENCOUNTER — Other Ambulatory Visit: Payer: Self-pay

## 2017-05-19 ENCOUNTER — Encounter (HOSPITAL_COMMUNITY): Payer: Self-pay | Admitting: *Deleted

## 2017-05-19 DIAGNOSIS — I129 Hypertensive chronic kidney disease with stage 1 through stage 4 chronic kidney disease, or unspecified chronic kidney disease: Secondary | ICD-10-CM | POA: Diagnosis not present

## 2017-05-19 DIAGNOSIS — G20 Parkinson's disease: Secondary | ICD-10-CM | POA: Diagnosis not present

## 2017-05-19 NOTE — Progress Notes (Signed)
Pt SDW-Pre-op call completed by pt spouse, [name] (DPR). Spouse stated that pt last dose of Coumadin was Sunday and not 5 days prior to surgery as requested; spouse stated that MD is aware and that pre-op instructions for Coumadin were given late. Spouse denies that pt C/O any acute cardiopulmonary issues. Spouse denies that pt had a chest x ray within the last year. Spouse denies recent labs. Spouse made aware to have pt stop taking vitamins, fish oil and herbal medications. Do not take any NSAIDs ie: Ibuprofen, Advil, Naproxen (Aleve), Motrin, BC and Goody Powder. Spouse verbalized understanding of all pre-op instructions. See anesthesia note.

## 2017-05-19 NOTE — Progress Notes (Signed)
Anesthesia Chart Review:  Pt is a same day work up  Pt is a 77 year old male scheduled for R triceps tendon repair on 05/20/2017 with Iran Planas, MD  - Cardiologist is Fransico Him, MD.  Last office visit 12/23/16. Pt cleared for surgery at "increased but acceptable risk" by Rosaria Ferries, PA on 05/14/17 - Neurologist is Star Age, MD who is aware of upcoming surgery. Last office visit 05/18/17  PMH includes:  Permanent atrial fibrillation, HTN, hyperlipidemia, parkinsonism, GERD. Never smoker. BMI 29.5. S/p R TKA 07/12/15. S/p L TKA 11/30/14  Medications include: Carbidopa-levodopa, doxazosin, Lasix, losartan, metoprolol, potassium, pravastatin, Coumadin. Pt to hold coumadin 5 days before surgery.   Labs will be obtained day of surgery  EKG 12/23/16: atrial fibrillation. LAD. Nonspecific T wave abnormality  Echo 01/01/16:  - Left ventricle: The cavity size was normal. Wall thickness was increased in a pattern of mild LVH. Systolic function was vigorous. The estimated ejection fraction was in the range of 65% to 70%. Wall motion was normal; there were no regional wall motion abnormalities. - Left atrium: The atrium was severely dilated. - Right atrium: The atrium was mildly dilated. - Pulmonary arteries: Systolic pressure was mildly increased. - Impressions: Normal LV systolic function; mild LVH; severe LAE; mild RAE; mild TR with mildly elevaeted pulmonary pressure.  Nuclear stress test 08/20/10: 1.  Perfusion defect in the inferior myocardial region is consistent with diaphragmatic attenuation.  Remaining myocardium demonstrates normal myocardial perfusion with no evidence of ischemia or infarct.  Post stress LV normal in size. 2.  Unable to gait for wall motion due to rhythm irregularity. 3.  Unremarkable pharmacological stress test. 4.  Normal myocardial perfusion study.  This was essentially a low risk scan.  Cardiac cath 04/08/01:  1. Paroxysmal atrial fibrillation. 2. Nonobstructive  coronary disease (LAD 30%). 3. Low-normal left ventricular function. 4. Mild mitral regurgitation with a history of thickened anterior mitral valve leaflet by echocardiogram.  If no changes, I anticipate pt can proceed with surgery as scheduled.   Willeen Cass, FNP-BC [facility]/Anesthesiology Phone: [phone] 05/19/2017 1:34 PM

## 2017-05-20 ENCOUNTER — Inpatient Hospital Stay (HOSPITAL_COMMUNITY): Payer: Medicare Other | Admitting: Emergency Medicine

## 2017-05-20 ENCOUNTER — Encounter (HOSPITAL_COMMUNITY): Payer: Self-pay | Admitting: *Deleted

## 2017-05-20 ENCOUNTER — Encounter (HOSPITAL_COMMUNITY)
Admission: RE | Disposition: A | Discharge status: Skilled Nursing Facility | Payer: Self-pay | Source: Ambulatory Visit | Attending: Orthopedic Surgery

## 2017-05-20 ENCOUNTER — Inpatient Hospital Stay (HOSPITAL_COMMUNITY)
Admission: RE | Admit: 2017-05-20 | Discharge: 2017-05-23 | DRG: 502 | Disposition: A | Discharge status: Skilled Nursing Facility | Payer: Medicare Other | Source: Ambulatory Visit | Attending: Orthopedic Surgery | Admitting: Orthopedic Surgery

## 2017-05-20 DIAGNOSIS — S46891D Other injury of other muscles, fascia and tendons at shoulder and upper arm level, right arm, subsequent encounter: Secondary | ICD-10-CM | POA: Diagnosis not present

## 2017-05-20 DIAGNOSIS — Z7901 Long term (current) use of anticoagulants: Secondary | ICD-10-CM | POA: Diagnosis not present

## 2017-05-20 DIAGNOSIS — I340 Nonrheumatic mitral (valve) insufficiency: Secondary | ICD-10-CM | POA: Diagnosis present

## 2017-05-20 DIAGNOSIS — D649 Anemia, unspecified: Secondary | ICD-10-CM | POA: Diagnosis present

## 2017-05-20 DIAGNOSIS — K219 Gastro-esophageal reflux disease without esophagitis: Secondary | ICD-10-CM | POA: Diagnosis present

## 2017-05-20 DIAGNOSIS — M1990 Unspecified osteoarthritis, unspecified site: Secondary | ICD-10-CM | POA: Diagnosis present

## 2017-05-20 DIAGNOSIS — Z888 Allergy status to other drugs, medicaments and biological substances status: Secondary | ICD-10-CM | POA: Diagnosis not present

## 2017-05-20 DIAGNOSIS — S46391S Other injury of muscle, fascia and tendon of triceps, right arm, sequela: Secondary | ICD-10-CM | POA: Diagnosis not present

## 2017-05-20 DIAGNOSIS — N400 Enlarged prostate without lower urinary tract symptoms: Secondary | ICD-10-CM | POA: Diagnosis present

## 2017-05-20 DIAGNOSIS — S46391D Other injury of muscle, fascia and tendon of triceps, right arm, subsequent encounter: Secondary | ICD-10-CM | POA: Diagnosis not present

## 2017-05-20 DIAGNOSIS — I2510 Atherosclerotic heart disease of native coronary artery without angina pectoris: Secondary | ICD-10-CM | POA: Diagnosis present

## 2017-05-20 DIAGNOSIS — M779 Enthesopathy, unspecified: Secondary | ICD-10-CM | POA: Diagnosis not present

## 2017-05-20 DIAGNOSIS — Z79899 Other long term (current) drug therapy: Secondary | ICD-10-CM

## 2017-05-20 DIAGNOSIS — Z4789 Encounter for other orthopedic aftercare: Secondary | ICD-10-CM | POA: Diagnosis not present

## 2017-05-20 DIAGNOSIS — I482 Chronic atrial fibrillation: Secondary | ICD-10-CM | POA: Diagnosis present

## 2017-05-20 DIAGNOSIS — J449 Chronic obstructive pulmonary disease, unspecified: Secondary | ICD-10-CM | POA: Diagnosis present

## 2017-05-20 DIAGNOSIS — I10 Essential (primary) hypertension: Secondary | ICD-10-CM | POA: Diagnosis present

## 2017-05-20 DIAGNOSIS — E785 Hyperlipidemia, unspecified: Secondary | ICD-10-CM | POA: Diagnosis present

## 2017-05-20 DIAGNOSIS — Z96653 Presence of artificial knee joint, bilateral: Secondary | ICD-10-CM | POA: Diagnosis present

## 2017-05-20 DIAGNOSIS — Z8614 Personal history of Methicillin resistant Staphylococcus aureus infection: Secondary | ICD-10-CM

## 2017-05-20 DIAGNOSIS — G20 Parkinson's disease: Secondary | ICD-10-CM | POA: Diagnosis present

## 2017-05-20 DIAGNOSIS — G8911 Acute pain due to trauma: Secondary | ICD-10-CM | POA: Diagnosis not present

## 2017-05-20 DIAGNOSIS — R488 Other symbolic dysfunctions: Secondary | ICD-10-CM | POA: Diagnosis not present

## 2017-05-20 DIAGNOSIS — S46311A Strain of muscle, fascia and tendon of triceps, right arm, initial encounter: Secondary | ICD-10-CM | POA: Diagnosis present

## 2017-05-20 DIAGNOSIS — M66821 Spontaneous rupture of other tendons, right upper arm: Secondary | ICD-10-CM | POA: Diagnosis not present

## 2017-05-20 DIAGNOSIS — R41841 Cognitive communication deficit: Secondary | ICD-10-CM | POA: Diagnosis not present

## 2017-05-20 DIAGNOSIS — M6281 Muscle weakness (generalized): Secondary | ICD-10-CM | POA: Diagnosis not present

## 2017-05-20 DIAGNOSIS — R2689 Other abnormalities of gait and mobility: Secondary | ICD-10-CM | POA: Diagnosis not present

## 2017-05-20 HISTORY — PX: TRICEPS TENDON REPAIR: SHX2577

## 2017-05-20 LAB — BASIC METABOLIC PANEL
ANION GAP: 12 (ref 5–15)
BUN: 16 mg/dL (ref 6–20)
CALCIUM: 9.4 mg/dL (ref 8.9–10.3)
CO2: 24 mmol/L (ref 22–32)
Chloride: 104 mmol/L (ref 101–111)
Creatinine, Ser: 1.03 mg/dL (ref 0.61–1.24)
Glucose, Bld: 74 mg/dL (ref 65–99)
POTASSIUM: 4 mmol/L (ref 3.5–5.1)
Sodium: 140 mmol/L (ref 135–145)

## 2017-05-20 LAB — CBC
HCT: 38.3 % — ABNORMAL LOW (ref 39.0–52.0)
Hemoglobin: 12.7 g/dL — ABNORMAL LOW (ref 13.0–17.0)
MCH: 31.8 pg (ref 26.0–34.0)
MCHC: 33.2 g/dL (ref 30.0–36.0)
MCV: 96 fL (ref 78.0–100.0)
PLATELETS: 185 10*3/uL (ref 150–400)
RBC: 3.99 MIL/uL — AB (ref 4.22–5.81)
RDW: 13.6 % (ref 11.5–15.5)
WBC: 3.4 10*3/uL — AB (ref 4.0–10.5)

## 2017-05-20 LAB — PROTIME-INR
INR: 1.88
Prothrombin Time: 21.4 seconds — ABNORMAL HIGH (ref 11.4–15.2)

## 2017-05-20 SURGERY — REPAIR, TENDON, TRICEPS
Anesthesia: General | Site: Elbow | Laterality: Right

## 2017-05-20 MED ORDER — POTASSIUM CHLORIDE CRYS ER 10 MEQ PO TBCR
10.0000 meq | EXTENDED_RELEASE_TABLET | Freq: Two times a day (BID) | ORAL | Status: DC
  Administered 2017-05-20 – 2017-05-23 (×6): 10 meq via ORAL
  Filled 2017-05-20 (×6): qty 1

## 2017-05-20 MED ORDER — DEXAMETHASONE SODIUM PHOSPHATE 10 MG/ML IJ SOLN
INTRAMUSCULAR | Status: DC | PRN
  Administered 2017-05-20: 10 mg via INTRAVENOUS

## 2017-05-20 MED ORDER — HYDROMORPHONE HCL 1 MG/ML IJ SOLN: 0.5000 mg | INTRAMUSCULAR | Status: DC | PRN

## 2017-05-20 MED ORDER — VITAMIN D3 25 MCG (1000 UNIT) PO TABS
1000.0000 [IU] | ORAL_TABLET | Freq: Every day | ORAL | Status: DC
  Administered 2017-05-21 – 2017-05-23 (×3): 1000 [IU] via ORAL
  Filled 2017-05-20 (×6): qty 1

## 2017-05-20 MED ORDER — LOSARTAN POTASSIUM 50 MG PO TABS
50.0000 mg | ORAL_TABLET | ORAL | Status: DC
  Administered 2017-05-21 – 2017-05-23 (×3): 50 mg via ORAL
  Filled 2017-05-20 (×3): qty 1

## 2017-05-20 MED ORDER — PROPOFOL 10 MG/ML IV BOLUS
INTRAVENOUS | Status: DC | PRN
  Administered 2017-05-20: 50 mg via INTRAVENOUS
  Administered 2017-05-20: 100 mg via INTRAVENOUS

## 2017-05-20 MED ORDER — VITAMIN C 500 MG PO TABS: 500.0000 mg | ORAL_TABLET | ORAL | Status: DC

## 2017-05-20 MED ORDER — WARFARIN SODIUM 7.5 MG PO TABS
7.5000 mg | ORAL_TABLET | Freq: Once | ORAL | Status: AC
  Administered 2017-05-20: 7.5 mg via ORAL
  Filled 2017-05-20 (×2): qty 1

## 2017-05-20 MED ORDER — CARBIDOPA-LEVODOPA ER 50-200 MG PO TBCR
1.0000 | EXTENDED_RELEASE_TABLET | Freq: Every day | ORAL | Status: DC
  Administered 2017-05-20 – 2017-05-22 (×3): 1 via ORAL
  Filled 2017-05-20 (×3): qty 1

## 2017-05-20 MED ORDER — LIDOCAINE HCL (CARDIAC) 20 MG/ML IV SOLN
INTRAVENOUS | Status: DC | PRN
  Administered 2017-05-20: 60 mg via INTRAVENOUS

## 2017-05-20 MED ORDER — DOXAZOSIN MESYLATE 2 MG PO TABS
2.0000 mg | ORAL_TABLET | Freq: Every day | ORAL | Status: DC
  Administered 2017-05-20 – 2017-05-22 (×3): 2 mg via ORAL
  Filled 2017-05-20 (×3): qty 1

## 2017-05-20 MED ORDER — ONDANSETRON HCL 4 MG/2ML IJ SOLN
INTRAMUSCULAR | Status: DC | PRN
  Administered 2017-05-20: 4 mg via INTRAVENOUS

## 2017-05-20 MED ORDER — CEFAZOLIN SODIUM-DEXTROSE 1-4 GM/50ML-% IV SOLN
1.0000 g | Freq: Three times a day (TID) | INTRAVENOUS | Status: DC
  Administered 2017-05-21 – 2017-05-23 (×6): 1 g via INTRAVENOUS
  Filled 2017-05-20 (×8): qty 50

## 2017-05-20 MED ORDER — WARFARIN - PHARMACIST DOSING INPATIENT
Freq: Every day | Status: DC
  Administered 2017-05-21: 18:00:00

## 2017-05-20 MED ORDER — METHOCARBAMOL 500 MG PO TABS
500.0000 mg | ORAL_TABLET | Freq: Four times a day (QID) | ORAL | Status: DC | PRN
  Administered 2017-05-21 – 2017-05-22 (×2): 500 mg via ORAL
  Filled 2017-05-20 (×2): qty 1

## 2017-05-20 MED ORDER — EPHEDRINE SULFATE 50 MG/ML IJ SOLN
INTRAMUSCULAR | Status: DC | PRN
  Administered 2017-05-20 (×2): 10 mg via INTRAVENOUS

## 2017-05-20 MED ORDER — TAMSULOSIN HCL 0.4 MG PO CAPS
0.4000 mg | ORAL_CAPSULE | Freq: Every day | ORAL | Status: DC
  Administered 2017-05-20 – 2017-05-22 (×3): 0.4 mg via ORAL
  Filled 2017-05-20 (×3): qty 1

## 2017-05-20 MED ORDER — DIPHENHYDRAMINE HCL 25 MG PO CAPS: 25.0000 mg | ORAL_CAPSULE | Freq: Four times a day (QID) | ORAL | Status: DC | PRN

## 2017-05-20 MED ORDER — OXYCODONE-ACETAMINOPHEN 5-325 MG PO TABS
1.0000 | ORAL_TABLET | ORAL | Status: DC | PRN
  Administered 2017-05-22: 2 via ORAL
  Filled 2017-05-20: qty 2

## 2017-05-20 MED ORDER — SUGAMMADEX SODIUM 200 MG/2ML IV SOLN
INTRAVENOUS | Status: DC | PRN
  Administered 2017-05-20: 200 mg via INTRAVENOUS

## 2017-05-20 MED ORDER — FENTANYL CITRATE (PF) 100 MCG/2ML IJ SOLN
INTRAMUSCULAR | Status: DC | PRN
  Administered 2017-05-20 (×4): 50 ug via INTRAVENOUS

## 2017-05-20 MED ORDER — HYDROCODONE-ACETAMINOPHEN 5-325 MG PO TABS
1.0000 | ORAL_TABLET | ORAL | Status: DC | PRN
  Administered 2017-05-20: 2 via ORAL
  Administered 2017-05-21: 1 via ORAL
  Filled 2017-05-20: qty 1
  Filled 2017-05-20: qty 2

## 2017-05-20 MED ORDER — SUGAMMADEX SODIUM 200 MG/2ML IV SOLN
INTRAVENOUS | Status: AC
  Filled 2017-05-20: qty 2

## 2017-05-20 MED ORDER — ONDANSETRON HCL 4 MG/2ML IJ SOLN
INTRAMUSCULAR | Status: AC
  Filled 2017-05-20: qty 2

## 2017-05-20 MED ORDER — CEFAZOLIN SODIUM-DEXTROSE 1-4 GM/50ML-% IV SOLN
1.0000 g | INTRAVENOUS | Status: AC
  Administered 2017-05-20: 1 g via INTRAVENOUS
  Filled 2017-05-20: qty 50

## 2017-05-20 MED ORDER — SALINE SPRAY 0.65 % NA SOLN
1.0000 | Freq: Every day | NASAL | Status: DC | PRN
  Filled 2017-05-20: qty 44

## 2017-05-20 MED ORDER — ONDANSETRON HCL 4 MG PO TABS: 4.0000 mg | ORAL_TABLET | Freq: Four times a day (QID) | ORAL | Status: DC | PRN

## 2017-05-20 MED ORDER — FENTANYL CITRATE (PF) 250 MCG/5ML IJ SOLN
INTRAMUSCULAR | Status: AC
  Filled 2017-05-20: qty 5

## 2017-05-20 MED ORDER — METOPROLOL SUCCINATE ER 25 MG PO TB24
25.0000 mg | ORAL_TABLET | Freq: Every evening | ORAL | Status: DC
  Administered 2017-05-20 – 2017-05-22 (×3): 25 mg via ORAL
  Filled 2017-05-20 (×3): qty 1

## 2017-05-20 MED ORDER — ONDANSETRON HCL 4 MG/2ML IJ SOLN: 4.0000 mg | Freq: Once | INTRAMUSCULAR | Status: DC | PRN

## 2017-05-20 MED ORDER — FENTANYL CITRATE (PF) 100 MCG/2ML IJ SOLN
INTRAMUSCULAR | Status: AC
  Administered 2017-05-20: 50 ug via INTRAVENOUS
  Filled 2017-05-20: qty 2

## 2017-05-20 MED ORDER — AMMONIUM LACTATE 12 % EX CREA: TOPICAL_CREAM | CUTANEOUS | Status: DC | PRN

## 2017-05-20 MED ORDER — PRAVASTATIN SODIUM 40 MG PO TABS
40.0000 mg | ORAL_TABLET | ORAL | Status: DC
  Administered 2017-05-21 – 2017-05-23 (×3): 40 mg via ORAL
  Filled 2017-05-20 (×3): qty 1

## 2017-05-20 MED ORDER — ONDANSETRON HCL 4 MG/2ML IJ SOLN: 4.0000 mg | Freq: Four times a day (QID) | INTRAMUSCULAR | Status: DC | PRN

## 2017-05-20 MED ORDER — CEFAZOLIN SODIUM-DEXTROSE 2-4 GM/100ML-% IV SOLN
INTRAVENOUS | Status: AC
  Filled 2017-05-20: qty 100

## 2017-05-20 MED ORDER — METHOCARBAMOL 1000 MG/10ML IJ SOLN
500.0000 mg | Freq: Four times a day (QID) | INTRAVENOUS | Status: DC | PRN
  Filled 2017-05-20: qty 5

## 2017-05-20 MED ORDER — VITAMIN C 500 MG PO TABS
1000.0000 mg | ORAL_TABLET | Freq: Every day | ORAL | Status: DC
  Administered 2017-05-21 – 2017-05-23 (×3): 1000 mg via ORAL
  Filled 2017-05-20 (×3): qty 2

## 2017-05-20 MED ORDER — LACTATED RINGERS IV SOLN
INTRAVENOUS | Status: DC
  Administered 2017-05-20 (×2): via INTRAVENOUS

## 2017-05-20 MED ORDER — ZOLPIDEM TARTRATE 5 MG PO TABS: 5.0000 mg | ORAL_TABLET | Freq: Every evening | ORAL | Status: DC | PRN

## 2017-05-20 MED ORDER — FENTANYL CITRATE (PF) 100 MCG/2ML IJ SOLN
25.0000 ug | INTRAMUSCULAR | Status: DC | PRN
  Administered 2017-05-20 (×2): 50 ug via INTRAVENOUS

## 2017-05-20 MED ORDER — DEXAMETHASONE SODIUM PHOSPHATE 10 MG/ML IJ SOLN
INTRAMUSCULAR | Status: AC
  Filled 2017-05-20: qty 1

## 2017-05-20 MED ORDER — FAMOTIDINE 40 MG PO TABS
40.0000 mg | ORAL_TABLET | Freq: Every evening | ORAL | Status: DC
  Administered 2017-05-21 – 2017-05-22 (×2): 40 mg via ORAL
  Filled 2017-05-20 (×2): qty 2
  Filled 2017-05-20: qty 1
  Filled 2017-05-20: qty 2
  Filled 2017-05-20: qty 1

## 2017-05-20 MED ORDER — BUPIVACAINE HCL (PF) 0.25 % IJ SOLN
INTRAMUSCULAR | Status: AC
  Filled 2017-05-20: qty 30

## 2017-05-20 MED ORDER — ROCURONIUM BROMIDE 100 MG/10ML IV SOLN
INTRAVENOUS | Status: DC | PRN
  Administered 2017-05-20: 50 mg via INTRAVENOUS

## 2017-05-20 MED ORDER — CARBIDOPA-LEVODOPA 25-100 MG PO TABS
2.0000 | ORAL_TABLET | Freq: Four times a day (QID) | ORAL | Status: DC
  Administered 2017-05-20 – 2017-05-23 (×10): 2 via ORAL
  Filled 2017-05-20 (×10): qty 2

## 2017-05-20 MED ORDER — 0.9 % SODIUM CHLORIDE (POUR BTL) OPTIME
TOPICAL | Status: DC | PRN
  Administered 2017-05-20: 1000 mL

## 2017-05-20 MED ORDER — GABAPENTIN 100 MG PO CAPS
100.0000 mg | ORAL_CAPSULE | Freq: Three times a day (TID) | ORAL | Status: DC
  Administered 2017-05-20 – 2017-05-23 (×8): 100 mg via ORAL
  Filled 2017-05-20 (×9): qty 1

## 2017-05-20 MED ORDER — CHLORHEXIDINE GLUCONATE 4 % EX LIQD: 60.0000 mL | Freq: Once | CUTANEOUS | Status: DC

## 2017-05-20 MED ORDER — BUPIVACAINE HCL (PF) 0.25 % IJ SOLN: INTRAMUSCULAR | Status: DC | PRN

## 2017-05-20 MED ORDER — ESCITALOPRAM OXALATE 10 MG PO TABS
10.0000 mg | ORAL_TABLET | Freq: Every day | ORAL | Status: DC
  Administered 2017-05-20 – 2017-05-22 (×3): 10 mg via ORAL
  Filled 2017-05-20 (×3): qty 1

## 2017-05-20 MED ORDER — WARFARIN SODIUM 5 MG PO TABS: 5.0000 mg | ORAL_TABLET | Freq: Every day | ORAL | Status: DC

## 2017-05-20 MED ORDER — CLONAZEPAM 0.25 MG PO TBDP
0.5000 mg | ORAL_TABLET | Freq: Every day | ORAL | Status: DC
  Administered 2017-05-20 – 2017-05-22 (×3): 0.5 mg via ORAL
  Filled 2017-05-20 (×3): qty 2

## 2017-05-20 MED ORDER — FUROSEMIDE 20 MG PO TABS
20.0000 mg | ORAL_TABLET | Freq: Two times a day (BID) | ORAL | Status: DC
  Administered 2017-05-20 – 2017-05-23 (×6): 20 mg via ORAL
  Filled 2017-05-20 (×6): qty 1

## 2017-05-20 MED ORDER — CEFAZOLIN SODIUM-DEXTROSE 2-4 GM/100ML-% IV SOLN
2.0000 g | INTRAVENOUS | Status: AC
  Administered 2017-05-20: 2 g via INTRAVENOUS

## 2017-05-20 MED ORDER — DOCUSATE SODIUM 100 MG PO CAPS
100.0000 mg | ORAL_CAPSULE | Freq: Two times a day (BID) | ORAL | Status: DC
  Administered 2017-05-20 – 2017-05-23 (×6): 100 mg via ORAL
  Filled 2017-05-20 (×6): qty 1

## 2017-05-20 MED ORDER — ADULT MULTIVITAMIN W/MINERALS CH
1.0000 | ORAL_TABLET | Freq: Every day | ORAL | Status: DC
  Administered 2017-05-21 – 2017-05-23 (×3): 1 via ORAL
  Filled 2017-05-20 (×2): qty 1

## 2017-05-20 MED ORDER — SODIUM CHLORIDE 0.9 % IV SOLN
INTRAVENOUS | Status: DC
  Administered 2017-05-20: 22:00:00 via INTRAVENOUS

## 2017-05-20 MED ORDER — AQUAPHOR EX OINT
TOPICAL_OINTMENT | Freq: Every day | CUTANEOUS | Status: DC | PRN
  Filled 2017-05-20: qty 50

## 2017-05-20 SURGICAL SUPPLY — 78 items
ANCH SUT PUSHLCK 19.5X3.5 STRL (Anchor) ×1 IMPLANT
ANCHOR PUSHLOCK PEEK 3.5X19.5 (Anchor) ×3 IMPLANT
APL SKNCLS STERI-STRIP NONHPOA (GAUZE/BANDAGES/DRESSINGS) ×1
BANDAGE ACE 3X5.8 VEL STRL LF (GAUZE/BANDAGES/DRESSINGS) ×6 IMPLANT
BANDAGE ACE 4X5 VEL STRL LF (GAUZE/BANDAGES/DRESSINGS) ×6 IMPLANT
BENZOIN TINCTURE PRP APPL 2/3 (GAUZE/BANDAGES/DRESSINGS) ×3 IMPLANT
BNDG CMPR 9X4 STRL LF SNTH (GAUZE/BANDAGES/DRESSINGS) ×1
BNDG ESMARK 4X9 LF (GAUZE/BANDAGES/DRESSINGS) ×3 IMPLANT
BNDG GAUZE ELAST 4 BULKY (GAUZE/BANDAGES/DRESSINGS) ×3 IMPLANT
BUR EGG ELITE 4.0 (BURR) ×2 IMPLANT
BUR EGG ELITE 4.0MM (BURR) ×1
CLIP VESOCCLUDE MED 6/CT (CLIP) IMPLANT
CLIP VESOCCLUDE SM WIDE 6/CT (CLIP) IMPLANT
CLOSURE WOUND 1/2 X4 (GAUZE/BANDAGES/DRESSINGS) ×1
CORDS BIPOLAR (ELECTRODE) ×3 IMPLANT
COVER SURGICAL LIGHT HANDLE (MISCELLANEOUS) ×3 IMPLANT
CUFF TOURNIQUET SINGLE 18IN (TOURNIQUET CUFF) ×3 IMPLANT
DRAPE OEC MINIVIEW 54X84 (DRAPES) IMPLANT
DRAPE SURG 17X23 STRL (DRAPES) ×3 IMPLANT
DRAPE U-SHAPE 47X51 STRL (DRAPES) ×3 IMPLANT
DRSG ADAPTIC 3X8 NADH LF (GAUZE/BANDAGES/DRESSINGS) ×3 IMPLANT
ELECT REM PT RETURN 9FT ADLT (ELECTROSURGICAL) ×3
ELECTRODE REM PT RTRN 9FT ADLT (ELECTROSURGICAL) ×1 IMPLANT
GAUZE SPONGE 4X4 12PLY STRL (GAUZE/BANDAGES/DRESSINGS) ×3 IMPLANT
GAUZE XEROFORM 5X9 LF (GAUZE/BANDAGES/DRESSINGS) ×3 IMPLANT
GLOVE BIOGEL PI IND STRL 8.5 (GLOVE) ×1 IMPLANT
GLOVE BIOGEL PI INDICATOR 8.5 (GLOVE) ×2
GLOVE SURG ORTHO 8.0 STRL STRW (GLOVE) ×3 IMPLANT
GOWN STRL REUS W/ TWL LRG LVL3 (GOWN DISPOSABLE) ×1 IMPLANT
GOWN STRL REUS W/ TWL XL LVL3 (GOWN DISPOSABLE) ×1 IMPLANT
GOWN STRL REUS W/TWL LRG LVL3 (GOWN DISPOSABLE) ×3
GOWN STRL REUS W/TWL XL LVL3 (GOWN DISPOSABLE) ×3
KIT BASIN OR (CUSTOM PROCEDURE TRAY) ×3 IMPLANT
KIT BIO-TENODESIS 3X8 DISP (MISCELLANEOUS)
KIT INSRT BABSR STRL DISP BTN (MISCELLANEOUS) IMPLANT
KIT ROOM TURNOVER OR (KITS) ×3 IMPLANT
LOOP VESSEL MAXI BLUE (MISCELLANEOUS) IMPLANT
MANIFOLD NEPTUNE II (INSTRUMENTS) ×3 IMPLANT
NDL SUT 6 .5 CRC .975X.05 MAYO (NEEDLE) IMPLANT
NEEDLE 22X1 1/2 (OR ONLY) (NEEDLE) IMPLANT
NEEDLE MAYO TAPER (NEEDLE)
NS IRRIG 1000ML POUR BTL (IV SOLUTION) ×3 IMPLANT
PACK ORTHO EXTREMITY (CUSTOM PROCEDURE TRAY) ×3 IMPLANT
PAD ARMBOARD 7.5X6 YLW CONV (MISCELLANEOUS) ×6 IMPLANT
PAD CAST 4YDX4 CTTN HI CHSV (CAST SUPPLIES) ×1 IMPLANT
PADDING CAST COTTON 4X4 STRL (CAST SUPPLIES) ×2
PASSER SUT SWANSON 36MM LOOP (INSTRUMENTS) ×3 IMPLANT
RETRIEVER SUT HEWSON (MISCELLANEOUS) ×3 IMPLANT
SOAP 2 % CHG 4 OZ (WOUND CARE) ×3 IMPLANT
SPLINT FIBERGLASS 3X35 (CAST SUPPLIES) ×3 IMPLANT
SPLINT FIBERGLASS 4X30 (CAST SUPPLIES) ×3 IMPLANT
SPONGE LAP 4X18 X RAY DECT (DISPOSABLE) ×3 IMPLANT
STAPLER VISISTAT 35W (STAPLE) ×3 IMPLANT
STRIP CLOSURE SKIN 1/2X4 (GAUZE/BANDAGES/DRESSINGS) ×2 IMPLANT
SUT 2 FIBERLOOP 20 STRT BLUE (SUTURE)
SUT FIBERWIRE #2 38 T-5 BLUE (SUTURE)
SUT MNCRL AB 3-0 PS2 18 (SUTURE) IMPLANT
SUT MNCRL AB 4-0 PS2 18 (SUTURE) IMPLANT
SUT PROLENE 4 0 PS 2 18 (SUTURE) IMPLANT
SUT VIC AB 0 CT1 27 (SUTURE) ×3
SUT VIC AB 0 CT1 27XBRD ANBCTR (SUTURE) ×1 IMPLANT
SUT VIC AB 2-0 CT1 27 (SUTURE) ×2
SUT VIC AB 2-0 CT1 TAPERPNT 27 (SUTURE) ×1 IMPLANT
SUT VIC AB 3-0 CT1 27 (SUTURE) ×3
SUT VIC AB 3-0 CT1 TAPERPNT 27 (SUTURE) ×1 IMPLANT
SUT VIC AB 3-0 FS2 27 (SUTURE) ×3 IMPLANT
SUT VIC AB 3-0 PS1 18 (SUTURE)
SUT VIC AB 3-0 PS1 18XBRD (SUTURE) IMPLANT
SUTURE 2 FIBERLOOP 20 STRT BLU (SUTURE) IMPLANT
SUTURE FIBERWR #2 38 T-5 BLUE (SUTURE) IMPLANT
SYR CONTROL 10ML LL (SYRINGE) IMPLANT
SYSTEM CHEST DRAIN TLS 7FR (DRAIN) IMPLANT
TOWEL OR 17X24 6PK STRL BLUE (TOWEL DISPOSABLE) ×3 IMPLANT
TOWEL OR 17X26 10 PK STRL BLUE (TOWEL DISPOSABLE) ×3 IMPLANT
TUBE CONNECTING 12'X1/4 (SUCTIONS) ×1
TUBE CONNECTING 12X1/4 (SUCTIONS) ×2 IMPLANT
UNDERPAD 30X30 (UNDERPADS AND DIAPERS) ×3 IMPLANT
WATER STERILE IRR 1000ML POUR (IV SOLUTION) ×3 IMPLANT

## 2017-05-20 NOTE — Op Note (Signed)
PREOPERATIVE DIAGNOSIS: Right triceps tendon tear  POSTOPERATIVE DIAGNOSIS: Same  ATTENDING SURGEON: Dr. Iran Planas who was scrubbed and present for the entire procedure  ASSISTANT SURGEON: Gertie Fey PA-C who was scrubbed and necessary for the entire procedure who helped aid and triceps repair closure and splinting in a timely fashion  ANESTHESIA: Gen. via endotracheal tube  OPERATIVE PROCEDURE: #1: Right elbow triceps tendon repair #2 Right elbow triceps tenolysis   IMPLANTS: #2 FiberWire suture  RADIOGRAPHIC INTERPRETATION: None  SURGICAL INDICATIONS: Right-hand-dominant gentleman who sustained the injury to his right elbow. Patient was referred to the office with the distal triceps tendon rupture. A sugar was recommended to undergo the above procedure. Risks of surgery include but not limited to bleeding infection damage to nearby nerves arteries or tendons loss of motion of the wrists and digits incomplete relief of symptoms and need for further surgical intervention.  SURGICAL TECHNIQUE: Patient was properly identified in the preoperative holding area and a mark with a permanent marker made on the right elbow to indicate correct operative site patient brought back to operating room placed supine on anesthesia and table where general anesthesia was administered. Patient tolerated this well. A well-padded tourniquet was then placed on the right brachium and sealed with the appropriate drape. The right upper extremities and prepped and draped in normal sterile fashion. A timeout was called the correct site was identified and the procedure was then begun.the right upper extremity been prepped and draped in normal sterile fashion. A curvilinear incision made directly over the posterior aspect of the elbow. Dissection carried down through the skin and subcutaneous tissue. Deep dissection carried down to identify the triceps proximally. There was marked scarring of the truck and retraction  of the triceps proximally. Careful team lysis was then carried out of the triceps mechanism fraying the triceps tendon to allowed to be mobilized distally. After the separate procedure triceps tendon lysis the triceps was then prepared with a #2 FiberWire suture. 2 suture limbs were then brought through 1 through the bony attachment on each limb. 2 crossing drill holes were then placed in the olecranon tip. The sutures were then brought out with a Lanny Hurst needle. The elbow was brought into near full extension. The sutures were then tied across down the bony bridge. Reinforcement sutures were then used with Vicryl and FiberWire suture along the margins of the repair. The wound was then thoroughly irrigated. After thorough wound irrigation the deep layer directly over the suture was then closed with 0 Vicryl suture. The subcutaneous tissues were then closed with 3-0 Vicryl suture. The skin was then closed with skin staples. Xeroform dressing a sterile compressive bandage then applied. The patient tolerated the procedure well was placed in a long-arm splint keeping the elbow in full extension extubated taken recovery room in good condition.   POSTOPERATIVE PLAN The patient be admitted to the hospital. Likely will require a transition to a skilled nursing facility given the underlying Parkinson's and comorbidities. I'll see him back now office in 2 weeks we'll keep him in the anterior portion the splint Relafen of the splint posteriorly take the staples out then see him back in the OFFICE with the therapist for a long-arm splint and begin a postoperative triceps tendon repair protocol.:

## 2017-05-20 NOTE — Anesthesia Preprocedure Evaluation (Addendum)
Anesthesia Evaluation  Patient identified by MRN, date of birth, ID band Patient awake    Reviewed: Allergy & Precautions, NPO status , Patient's Chart, lab work & pertinent test results, reviewed documented beta blocker date and time   Airway Mallampati: II  TM Distance: >3 FB Neck ROM: Full    Dental  (+) Teeth Intact   Pulmonary COPD,  Restrictive lung disease   Pulmonary exam normal breath sounds clear to auscultation       Cardiovascular hypertension, Pt. on medications and Pt. on home beta blockers + dysrhythmias Atrial Fibrillation + Valvular Problems/Murmurs  Rhythm:Irregular Rate:Normal + Systolic murmurs Chronic Atrial Fibrillation   Neuro/Psych Anxiety Parkinson's Disease    GI/Hepatic Neg liver ROS, GERD  Medicated and Controlled,  Endo/Other  Hyperlipidemia  Renal/GU Renal InsufficiencyRenal disease   BPH ED    Musculoskeletal  (+) Arthritis , Osteoarthritis,  Torn triceps tendon   Abdominal   Peds  Hematology  (+) anemia , Chronic anticoagulation Coumadin- last dose 2/24 pm   Anesthesia Other Findings   Reproductive/Obstetrics                           Anesthesia Physical Anesthesia Plan  ASA: III  Anesthesia Plan: General   Post-op Pain Management:    Induction: Intravenous  PONV Risk Score and Plan: Ondansetron, Treatment may vary due to age or medical condition and Dexamethasone  Airway Management Planned: Oral ETT  Additional Equipment:   Intra-op Plan:   Post-operative Plan: Extubation in OR  Informed Consent: I have reviewed the patients History and Physical, chart, labs and discussed the procedure including the risks, benefits and alternatives for the proposed anesthesia with the patient or authorized representative who has indicated his/her understanding and acceptance.   Dental advisory given  Plan Discussed with: CRNA, Anesthesiologist and  Surgeon  Anesthesia Plan Comments:         Anesthesia Quick Evaluation

## 2017-05-20 NOTE — Progress Notes (Signed)
ANTICOAGULATION CONSULT NOTE - Initial Consult  Pharmacy Consult for Coumadin Indication: atrial fibrillation  Allergies  Allergen Reactions  . Lisinopril Cough    Patient Measurements: Height: 6\' 2"  (188 cm) Weight: 225 lb (102.1 kg) IBW/kg (Calculated) : 82.2  Vital Signs: Temp: 97.7 F (36.5 C) (02/27 1830) Temp Source: Oral (02/27 1501) BP: 170/93 (02/27 1915) Pulse Rate: 95 (02/27 1915)  Labs: Recent Labs    05/20/17 1442  HGB 12.7*  HCT 38.3*  PLT 185  LABPROT 21.4*  INR 1.88  CREATININE 1.03    Estimated Creatinine Clearance: 80.3 mL/min (by C-G formula based on SCr of 1.03 mg/dL).   Medical History: Past Medical History:  Diagnosis Date  . Abnormal PFT 1. 05/18/08  2. 11/30/08   1. Showed mild airflow obstruction, mild restriction, mild diffusion defect; FEV1 2.22(64%), FVC 3.33(65%), FEVi% 67, TLC 5.19(69%), DLCO 77%, +BD  2. FEV1 2.38(73%), FVC 3.81(80%), FEV1% 63, TLC 5.61(80%), DLCO 79%, no BD  . Allergic rhinitis   . Anxiety   . BPH (benign prostatic hyperplasia)   . Cellulitis    right leg MRSA  . Erectile dysfunction   . GERD (gastroesophageal reflux disease)   . Heart murmur    mild MR by echo  . Hyperlipidemia   . Hypertension   . Osteoarthritis   . Paresthesias 06/22/2012  . Parkinsonism ([place]) 06/22/2012  . Permanent atrial fibrillation ([place])   . Restrictive lung disease     Medications:  Medications Prior to Admission  Medication Sig Dispense Refill Last Dose  . carbidopa-levodopa (SINEMET CR) 50-200 MG tablet Take 1 tablet by mouth at bedtime. 90 tablet 3 05/19/2017 at Unknown time  . carbidopa-levodopa (SINEMET IR) 25-100 MG tablet Take 2 tablets by mouth 4 (four) times daily. (Patient taking differently: Take 2 tablets by mouth 4 (four) times daily. 7AM, 11AM, 3PM, 7PM) 720 tablet 3 05/20/2017 at Unknown time  . cholecalciferol (VITAMIN D) 1000 units tablet Take 1,000 Units by mouth daily. 7 am   05/19/2017 at Unknown time  . clonazePAM  (KLONOPIN) 0.5 MG disintegrating tablet Take 1 tablet (0.5 mg total) by mouth at bedtime. 90 tablet 3 05/19/2017 at Unknown time  . doxazosin (CARDURA) 2 MG tablet Take 2 mg by mouth at bedtime.    05/19/2017 at Unknown time  . escitalopram (LEXAPRO) 10 MG tablet Take 1 tablet (10 mg total) by mouth at bedtime. 90 tablet 3 05/19/2017 at Unknown time  . famotidine (PEPCID) 40 MG tablet Take 40 mg by mouth every evening. 7 pm   05/19/2017 at Unknown time  . furosemide (LASIX) 20 MG tablet Take 20 mg by mouth 2 (two) times daily. 7 am, 7 pm   05/19/2017 at Unknown time  . gabapentin (NEURONTIN) 100 MG capsule Take 1 capsule (100 mg total) by mouth 3 (three) times daily. (Patient taking differently: Take 100 mg by mouth 3 (three) times daily. 7AM, 3PM, 7PM) 270 capsule 3 05/20/2017 at Unknown time  . losartan (COZAAR) 50 MG tablet Take 50 mg by mouth every morning.   05/19/2017 at Unknown time  . metoprolol succinate (TOPROL-XL) 25 MG 24 hr tablet Take 25 mg by mouth every evening. 7 pm   05/19/2017 at Unknown time  . Multiple Vitamin (MULTIVITAMIN WITH MINERALS) TABS tablet Take 1 tablet by mouth daily. 7 am   05/19/2017 at Unknown time  . potassium chloride (K-DUR,KLOR-CON) 10 MEQ tablet Take 10 mEq by mouth 2 (two) times daily.    05/19/2017 at Unknown time  .  pravastatin (PRAVACHOL) 40 MG tablet Take 40 mg by mouth every morning.    05/20/2017 at Unknown time  . sodium chloride (OCEAN) 0.65 % SOLN nasal spray Place 1 spray into both nostrils daily as needed for congestion.   Taking  . vitamin C (ASCORBIC ACID) 500 MG tablet Take 500 mg by mouth every morning.   05/19/2017 at Unknown time  . warfarin (COUMADIN) 5 MG tablet TAKE AS DIRECTED BY COUMADIN CLINIC (Patient taking differently: Take 7.5 mg by mouth daily on Wednesday and Saturday. Take 5 mg by mouth daily on all other days) 100 tablet 1 Past Week at Unknown time  . ammonium lactate (LAC-HYDRIN) 12 % cream Apply topically as needed for dry skin. As  prescribed (Patient taking differently: Apply 1 g topically as needed for dry skin. As prescribed) 385 g 3 More than a month at Unknown time  . tamsulosin (FLOMAX) 0.4 MG CAPS capsule Take 0.4 mg at bedtime by mouth.   More than a month at Unknown time    Assessment: 77 yo M s/p R elbow triceps tendon tear.  Coumadin PTA for hx afib.  Held 5d before scheduled surgery.  INR on admission: 1.88.  Surgical repair completed 2/27.  Home dose > Coumadin 5mg  daily except 7.5mg  on Wed and Sat.  Goal of Therapy:  INR 2-3 Monitor platelets by anticoagulation protocol: Yes   Plan:  Coumadin 7.5mg  PO x 1 tonight - will schedule for 2200 hoping pt will be out of PACU. Daily INR.  [employer], Pharm.D., BCPS Clinical Pharmacist 05/20/2017 7:29 PM

## 2017-05-20 NOTE — Anesthesia Procedure Notes (Addendum)
Procedure Name: Intubation Date/Time: 05/20/2017 4:21 PM Performed by: Lance Coon, CRNA Pre-anesthesia Checklist: Emergency Drugs available, Suction available, Patient being monitored, Timeout performed and Patient identified Patient Re-evaluated:Patient Re-evaluated prior to induction Oxygen Delivery Method: Circle system utilized Preoxygenation: Pre-oxygenation with 100% oxygen Induction Type: IV induction Ventilation: Mask ventilation without difficulty Laryngoscope Size: Miller and 3 Grade View: Grade II Tube type: Oral Tube size: 7.5 mm Number of attempts: 1 Airway Equipment and Method: Stylet Placement Confirmation: ETT inserted through vocal cords under direct vision,  breath sounds checked- equal and bilateral and positive ETCO2 Secured at: 22 cm Tube secured with: Tape Dental Injury: Teeth and Oropharynx as per pre-operative assessment

## 2017-05-20 NOTE — Transfer of Care (Signed)
Immediate Anesthesia Transfer of Care Note  Patient: Brian [name]  Procedure(s) Performed: TRICEPS TENDON REPAIR (Right Elbow)  Patient Location: PACU  Anesthesia Type:General  Level of Consciousness: drowsy and patient cooperative  Airway & Oxygen Therapy: Patient Spontanous Breathing and Patient connected to nasal cannula oxygen  Post-op Assessment: Report given to RN  Post vital signs: Reviewed and stable  Last Vitals:  Vitals:   05/20/17 1501 05/20/17 1830  BP: (!) 170/91   Pulse: 71 (P) 79  Resp: 18 (P) 12  Temp: 36.6 C (P) 36.5 C  SpO2: 100%     Last Pain:  Vitals:   05/20/17 1501  TempSrc: Oral      Patients Stated Pain Goal: 8 (20/80/22 3361)  Complications: No apparent anesthesia complications

## 2017-05-21 ENCOUNTER — Encounter (HOSPITAL_COMMUNITY): Payer: Self-pay | Admitting: General Practice

## 2017-05-21 ENCOUNTER — Other Ambulatory Visit: Payer: Self-pay

## 2017-05-21 LAB — PROTIME-INR
INR: 1.91
Prothrombin Time: 21.7 seconds — ABNORMAL HIGH (ref 11.4–15.2)

## 2017-05-21 MED ORDER — WARFARIN SODIUM 5 MG PO TABS
5.0000 mg | ORAL_TABLET | Freq: Once | ORAL | Status: AC
  Administered 2017-05-21: 5 mg via ORAL
  Filled 2017-05-21: qty 1

## 2017-05-21 NOTE — Progress Notes (Signed)
ANTICOAGULATION CONSULT NOTE - Initial Consult  Pharmacy Consult for Coumadin Indication: atrial fibrillation  Allergies  Allergen Reactions  . Lisinopril Cough   Patient Measurements: Height: 6\' 2"  (188 cm) Weight: 225 lb (102.1 kg) IBW/kg (Calculated) : 82.2  Assessment: 77 yo M s/p R elbow triceps tendon tear. On Coumadin 5mg  daily exc 7.5mg  on Wed/Sat PTA for hx afib. Held 5d before scheduled surgery.  INR on admission: 1.88. Today is INR is 1.91. Hgb 12.7, plts wnl.  Goal of Therapy:  INR 2-3 Monitor platelets by anticoagulation protocol: Yes   Plan:  Give Coumadin 5mg  PO x 1 Monitor daily INR, CBC, s/s of bleed  Elenor Quinones, PharmD, [facility] Clinical Pharmacist Pager [phone] 05/21/2017 9:55 AM

## 2017-05-21 NOTE — Progress Notes (Signed)
CHART REVIEWED FL2 FORM COSIGNED SPOKE WITH NURSING STAFF VITALS NOTED PAIN LEVEL MINIMAL PT AWAITS PLACEMENT CONTINUE WITH LONG ARM SPLINT

## 2017-05-21 NOTE — NC FL2 (Signed)
[place] MEDICAID FL2 LEVEL OF CARE SCREENING TOOL     IDENTIFICATION  Patient Name: Brian [name] Birthdate: December 17, 1940 Sex: male Admission Date (Current Location): 05/20/2017  [facility] and [place] Number:  Herbalist and Address:  [facility] [place], [place] [address], [place], [place] [address]      Provider Number: [id number]  Attending Physician Name and Address:  Iran Planas, MD  Relative Name and Phone Number:  Collier Bohnet, spouse [phone]    Current Level of Care: Hospital Recommended Level of Care: [place] Prior Approval Number:    Date Approved/Denied:   PASRR Number: [id number]  Discharge Plan: SNF    Current Diagnoses: Patient Active Problem List   Diagnosis Date Noted  . Rupture of right triceps tendon 05/20/2017  . Triceps tendon rupture, right, initial encounter 05/20/2017  . Atrial fibrillation ([place]) [I48.91] 02/05/2017  . Permanent atrial fibrillation ([place])   . Urinary retention 12/31/2015  . Acute renal failure (ARF) ([place]) 12/31/2015  . Acute encephalopathy 12/31/2015  . Primary osteoarthritis of right knee 07/12/2015  . Primary osteoarthritis of left knee 11/30/2014  . Encounter for therapeutic drug monitoring 06/21/2013  . Edema extremities 02/04/2013  . Chronic anticoagulation 02/04/2013  . GERD (gastroesophageal reflux disease)   . Restrictive lung disease   . CKD (chronic kidney disease) stage 2, GFR 60-89 ml/min   . Cellulitis   . Allergic rhinitis   . Anxiety   . Long term (current) use of anticoagulants 01/25/2013  . Parkinsonism ([place]) 06/22/2012  . Paresthesias 06/22/2012  . PULMONARY NODULE 06/02/2008  . SNORING 06/02/2008  . Hyperlipidemia 05/15/2008  . Essential hypertension 05/15/2008  . PULMONARY FUNCTION TESTS, ABNORMAL 05/15/2008    Orientation RESPIRATION BLADDER Height & Weight     Self, Time, Situation, Place  Normal Continent Weight: 225 lb (102.1 kg) Height:  6\' 2"   (188 cm)  BEHAVIORAL SYMPTOMS/MOOD NEUROLOGICAL BOWEL NUTRITION STATUS      Continent Diet(See DC Summary)  AMBULATORY STATUS COMMUNICATION OF NEEDS Skin   Extensive Assist Verbally Surgical wounds                       Personal Care Assistance Level of Assistance  Dressing, Bathing, Feeding Bathing Assistance: Maximum assistance Feeding assistance: Limited assistance Dressing Assistance: Maximum assistance     Functional Limitations Info  Sight, Hearing, Speech Sight Info: Adequate Hearing Info: Adequate Speech Info: Adequate    SPECIAL CARE FACTORS FREQUENCY  PT (By licensed PT), OT (By licensed OT)     PT Frequency: 5x week OT Frequency: 5x week            Contractures      Additional Factors Info  Code Status, Allergies, Psychotropic Code Status Info: Full Allergies Info: LISINOPRIL  Psychotropic Info: Lexapro, klonopin         Current Medications (05/21/2017):  This is the current hospital active medication list Current Facility-Administered Medications  Medication Dose Route Frequency Provider Last Rate Last Dose  . 0.9 %  sodium chloride infusion   Intravenous Continuous Gertie Fey [place], [place] 10 mL/hr at 05/20/17 2158    . carbidopa-levodopa (SINEMET CR) 50-200 MG per tablet controlled release 1 tablet  1 tablet Oral QHS Gertie Fey [place], [place]   1 tablet at 05/20/17 2209  . carbidopa-levodopa (SINEMET IR) 25-100 MG per tablet immediate release 2 tablet  2 tablet Oral QID Brynda Peon, PA   2 tablet at 05/21/17 1430  .  ceFAZolin (ANCEF) IVPB 1 g/50 mL premix  1 g Intravenous Q8H Gertie Fey [place], [place] 100 mL/hr at 05/21/17 1430 1 g at 05/21/17 1430  . cholecalciferol (VITAMIN D) tablet 1,000 Units  1,000 Units Oral Daily Gertie Fey [place], [place]   1,000 Units at 05/21/17 [id number]  . clonazePAM (KLONOPIN) disintegrating tablet 0.5 mg  0.5 mg Oral QHS Iran Planas, MD   0.5 mg at 05/20/17 2159  . diphenhydrAMINE (BENADRYL) capsule  25-50 mg  25-50 mg Oral Q6H PRN Gertie Fey [place], PA      . docusate sodium (COLACE) capsule 100 mg  100 mg Oral BID Gertie Fey [place], PA   100 mg at 05/21/17 7673  . doxazosin (CARDURA) tablet 2 mg  2 mg Oral QHS Gertie Fey [place], [place]   2 mg at 05/20/17 2202  . escitalopram (LEXAPRO) tablet 10 mg  10 mg Oral QHS Gertie Fey [place], [place]   10 mg at 05/20/17 2201  . famotidine (PEPCID) tablet 40 mg  40 mg Oral QPM Gertie Fey [place], [place]      . furosemide (LASIX) tablet 20 mg  20 mg Oral BID Gertie Fey [place] [facility], [place]   20 mg at 05/21/17 4193  . gabapentin (NEURONTIN) capsule 100 mg  100 mg Oral TID Brynda Peon, PA   100 mg at 05/21/17 7902  . HYDROcodone-acetaminophen (NORCO/VICODIN) 5-325 MG per tablet 1-2 tablet  1-2 tablet Oral Q4H PRN Brynda Peon, PA   2 tablet at 05/20/17 2158  . HYDROmorphone (DILAUDID) injection 0.5-1 mg  0.5-1 mg Intravenous Q2H PRN Gertie Fey [place], PA      . lactated ringers infusion   Intravenous Continuous Iran Planas, MD 50 mL/hr at 05/20/17 1452    . losartan (COZAAR) tablet 50 mg  50 mg Oral Rock Nephew [place], [place]   50 mg at 05/21/17 4097  . methocarbamol (ROBAXIN) tablet 500 mg  500 mg Oral Q6H PRN Brynda Peon, PA       Or  . methocarbamol (ROBAXIN) 500 mg in dextrose 5 % 50 mL IVPB  500 mg Intravenous Q6H PRN Gertie Fey [place], PA      . metoprolol succinate (TOPROL-XL) 24 hr tablet 25 mg  25 mg Oral QPM Gertie Fey [place], [place]   25 mg at 05/20/17 2209  . mineral oil-hydrophilic petrolatum (AQUAPHOR) ointment   Topical Daily PRN Iran Planas, MD      . multivitamin with minerals tablet 1 tablet  1 tablet Oral Daily Gertie Fey [place], [place]   1 tablet at 05/21/17 [phone]  . ondansetron (ZOFRAN) tablet 4 mg  4 mg Oral Q6H PRN Brynda Peon, PA       Or  . ondansetron [facility]) injection 4 mg  4 mg Intravenous Q6H PRN Brynda Peon, PA      .  oxyCODONE-acetaminophen (PERCOCET/ROXICET) 5-325 MG per tablet 1-2 tablet  1-2 tablet Oral Q4H PRN Gertie Fey [place], PA      . potassium chloride (K-DUR,KLOR-CON) CR tablet 10 mEq  10 mEq Oral BID Gertie Fey [place], PA   10 mEq at 05/21/17 0836  . pravastatin (PRAVACHOL) tablet 40 mg  40 mg Oral Rock Nephew [place], [place]   40 mg at 05/21/17 9924  . sodium chloride (OCEAN) 0.65 % nasal spray 1 spray  1 spray Each Nare Daily PRN Brynda Peon, PA      . tamsulosin (FLOMAX) capsule 0.4 mg  0.4 mg Oral QHS Gertie Fey [place],  PA   0.4 mg at 05/20/17 2201  . vitamin C (ASCORBIC ACID) tablet 1,000 mg  1,000 mg Oral Daily Gertie Fey [place], [place]   1,000 mg at 05/21/17 2952  . warfarin (COUMADIN) tablet 5 mg  5 mg Oral ONCE-1800 Reginia Naas, RPH      . Warfarin - Pharmacist Dosing Inpatient   Does not apply q1800 Hammons, Theone Murdoch, RPH      . zolpidem (AMBIEN) tablet 5 mg  5 mg Oral QHS PRN Brynda Peon, PA         Discharge Medications: Please see discharge summary for a list of discharge medications.  Relevant Imaging Results:  Relevant Lab Results:   Additional Information SS#: [id number]  [place], LCSW

## 2017-05-21 NOTE — Evaluation (Signed)
Physical Therapy Evaluation Patient Details Name: Brian [name] MRN: [id number] DOB: 1940-05-01 Today's Date: 05/21/2017   History of Present Illness  77 year old right-hand dominant male who sustained an injury to the right arm on 04/27/17. Now s/p Right elbow triceps tendon repair and tenolysis. PMH significant for Parkinsonism, permanent atrial fibrillation, h/o B TKA.     Clinical Impression  Patient is s/p above surgery resulting in functional limitations due to the deficits listed below (see PT Problem List). PTA, pt living with wife, ambulating in household with RW. Upon eval, patient presents with mild post op pain, NWB status in RUE, and overall weakness since PTA that is limiting his mobility at this time. Patient currently requires mod A x 2 for OOB mobility and will benefit from sub acute rehab before returning home. Wife and patient agreeable.  Patient will benefit from skilled PT to increase their independence and safety with mobility to allow discharge to the venue listed below.       Follow Up Recommendations SNF;Supervision/Assistance - 24 hour    Equipment Recommendations  None recommended by PT    Recommendations for Other Services       Precautions / Restrictions Precautions Precautions: Fall Precaution Comments: Pt reports left side is usually his strong side Required Braces or Orthoses: Other Brace/Splint Restrictions Weight Bearing Restrictions: Yes RUE Weight Bearing: Non weight bearing      Mobility  Bed Mobility Overal bed mobility: Needs Assistance Bed Mobility: Supine to Sit     Supine to sit: Mod assist;+2 for physical assistance;HOB elevated     General bed mobility comments: support of trunk for safety. assist posteriorly and to advance hips to full EOB position. Pt keeping RUE in NWB position. cues for technique, extra time and effort.   Transfers Overall transfer level: Needs assistance Equipment used: Rolling walker (2 wheeled) Transfers:  Sit to/from [facility] Sit to Stand: +2 physical assistance;From elevated surface;Mod assist;Max assist Stand pivot transfers: Mod assist;+2 physical assistance       General transfer comment: Pt reporting (and therapist observed) difficulty with motor control of BLE (Parkinsons) more than usual baseline. This was pt's first time OOB since surgery. Needing physical assist for mechanics/control of movement, steadying asssit and assist to powerup/control descent during sit<>stand. Also needed physical assist (therapist holding pt's right hand and advancing rw on R side) to maintain NWB RUE while using rw. Cues for safety and sequencing, base of suport. Pt able to follow but not demo consistently   Ambulation/Gait             General Gait Details: unable to progress this session  Stairs            Wheelchair Mobility    Modified Rankin (Stroke Patients Only)       Balance Overall balance assessment: Needs assistance Sitting-balance support: Single extremity supported;Feet supported Sitting balance-Leahy Scale: Fair     Standing balance support: Single extremity supported;During functional activity Standing balance-Leahy Scale: Poor Standing balance comment: rw (therapist assisting on R side to maintain NWB) and mod +2 for static stand. Pt with tendency towards narrow BOS.                              Pertinent Vitals/Pain Pain Assessment: Faces Faces Pain Scale: Hurts a little bit Pain Location: LUE Pain Descriptors / Indicators: Sore Pain Intervention(s): Limited activity within patient's tolerance;Monitored during session;Premedicated before session;Repositioned    Home  Living Family/patient expects to be discharged to:: Skilled nursing facility Living Arrangements: Spouse/significant other;Other relatives               Additional Comments: has adjustable bed, w/c rw. cane. shower seat, grab bars in tub shower. Stairs: 3 to enter  then 3+8+8 to get to bedroom.     Prior Function Level of Independence: Independent with assistive device(s)         Comments: occasional use of rw prior to injury, extra time and effort with ADLs but no physical assist.      Hand Dominance   Dominant Hand: Right    Extremity/Trunk Assessment   Upper Extremity Assessment Upper Extremity Assessment: Defer to OT evaluation RUE Deficits / Details: s/p triceps tendon repair, LUE in long arm splint. able to flex and extend digits of R hand. Educated pt and spouse on edema control of LUE. Positioned with 2 pillows under right arm while sitting up in recliner.  RUE: Unable to fully assess due to immobilization    Lower Extremity Assessment Lower Extremity Assessment: (Gross strength BLE 4-/5, LLE slightly stronger)       Communication   Communication: Other (comment)  Cognition Arousal/Alertness: Awake/alert Behavior During Therapy: Flat affect;WFL for tasks assessed/performed Overall Cognitive Status: Within Functional Limits for tasks assessed                                        General Comments      Exercises     Assessment/Plan    PT Assessment Patient needs continued PT services  PT Problem List         PT Treatment Interventions DME instruction;Gait training;Functional mobility training;Stair training;Therapeutic activities;Therapeutic exercise;Neuromuscular re-education;Balance training;Patient/family education    PT Goals (Current goals can be found in the Care Plan section)  Acute Rehab PT Goals Patient Stated Goal: rehab then home PT Goal Formulation: With patient/family Time For Goal Achievement: 05/28/17 Potential to Achieve Goals: Good    Frequency Min 3X/week   Barriers to discharge        Co-evaluation PT/OT/SLP Co-Evaluation/Treatment: Yes Reason for Co-Treatment: Complexity of the patient's impairments (multi-system involvement);Necessary to address cognition/behavior  during functional activity;For patient/therapist safety;To address functional/ADL transfers PT goals addressed during session: Balance;Mobility/safety with mobility;Proper use of DME;Strengthening/ROM OT goals addressed during session: ADL's and self-care       AM-PAC PT "6 Clicks" Daily Activity  Outcome Measure Difficulty turning over in bed (including adjusting bedclothes, sheets and blankets)?: Unable Difficulty moving from lying on back to sitting on the side of the bed? : Unable Difficulty sitting down on and standing up from a chair with arms (e.g., wheelchair, bedside commode, etc,.)?: Unable Help needed moving to and from a bed to chair (including a wheelchair)?: A Lot Help needed walking in hospital room?: A Lot Help needed climbing 3-5 steps with a railing? : Total 6 Click Score: 8    End of Session Equipment Utilized During Treatment: Gait belt Activity Tolerance: Patient tolerated treatment well;Patient limited by fatigue Patient left: in chair;with call bell/phone within reach;with family/visitor present Nurse Communication: Mobility status PT Visit Diagnosis: Other abnormalities of gait and mobility (R26.89);Unsteadiness on feet (R26.81);Muscle weakness (generalized) (M62.81);Other symptoms and signs involving the nervous system (R29.898);Pain;Difficulty in walking, not elsewhere classified (R26.2) Pain - Right/Left: Right Pain - part of body: Arm    Time: 1200-1230 PT Time Calculation (min) (ACUTE ONLY):  30 min   Charges:   PT Evaluation $PT Eval Moderate Complexity: 1 Mod     PT G Codes:       Reinaldo Berber, PT, DPT Acute Rehab Services Pager: [phone]    Reinaldo Berber 05/21/2017, 2:44 PM

## 2017-05-21 NOTE — Evaluation (Signed)
Occupational Therapy Evaluation Patient Details Name: Brian [name] MRN: [id number] DOB: April 12, 1940 Today's Date: 05/21/2017    History of Present Illness 77 year old right-hand dominant male who sustained an injury to the right arm on 04/27/17. Now s/p Right elbow triceps tendon repair and tenolysis. PMH significant for Parkinsonism, permanent atrial fibrillation, h/o B TKA.    Clinical Impression   Pt admitted with the above diagnoses and presents with below problem list. Pt will benefit from continued acute OT to address the below listed deficits and maximize independence with basic ADLs prior to d/c to next venue. At baseline pt is mod I with ADLs. Pt is currently mod-max +2 physical A with functional transfers, bed mobility and LB ADLs. Pt completed bed mobility and SPT from EOB to recliner this session. Spouse present and included in education/discussions. Educated on edema control and elevated positioning of RUE at rest.       Follow Up Recommendations  SNF    Equipment Recommendations  Other (comment)(defer to next venue)    Recommendations for Other Services       Precautions / Restrictions Precautions Precautions: Fall Precaution Comments: Pt reports left side is usually his strong side Required Braces or Orthoses: Other Brace/Splint(LUE in long arm splint with overlaying ACE wrap ) Restrictions Weight Bearing Restrictions: Yes RUE Weight Bearing: Non weight bearing      Mobility Bed Mobility Overal bed mobility: Needs Assistance Bed Mobility: Supine to Sit     Supine to sit: Mod assist;+2 for physical assistance;HOB elevated     General bed mobility comments: assist posteriorly and to advance hips to full EOB position. Pt keeping RUE in NWB position. cues for technique, extra time and effort.   Transfers Overall transfer level: Needs assistance Equipment used: Rolling walker (2 wheeled) Transfers: Sit to/from [facility] Sit to Stand: +2  physical assistance;From elevated surface;Mod assist;Max assist Stand pivot transfers: Mod assist;+2 physical assistance       General transfer comment: Pt reporting (and therapist observed) difficulty with motor control of BLE (Parkinsons) more than usual baseline. This was pt's first time OOB since surgery. Needing physical assist for mechanics/control of movement, steadying asssit and assist to powerup/control descent during sit<>stand. Also needed physical assist (therapist holding pt's right hand and advancing rw on R side) to maintain NWB RUE while using rw.     Balance Overall balance assessment: Needs assistance Sitting-balance support: Single extremity supported;Feet supported Sitting balance-Leahy Scale: Fair     Standing balance support: Single extremity supported;During functional activity Standing balance-Leahy Scale: Poor Standing balance comment: rw (therapist assisting on R side to maintain NWB) and mod +2 for static stand. Pt with tendency towards narrow BOS.                            ADL either performed or assessed with clinical judgement   ADL Overall ADL's : Needs assistance/impaired Eating/Feeding: Set up;Sitting;Minimal assistance   Grooming: Moderate assistance;Sitting   Upper Body Bathing: Maximal assistance;Sitting   Lower Body Bathing: Maximal assistance;+2 for physical assistance;Sit to/from stand   Upper Body Dressing : Maximal assistance;Sitting   Lower Body Dressing: Maximal assistance;+2 for physical assistance;Sit to/from stand   Toilet Transfer: Maximal assistance;+2 for physical assistance;Stand-pivot;RW Toilet Transfer Details (indicate cue type and reason): therapist assisting with positioning of RUE for NWB and advancing rw on right side.  Toileting- Clothing Manipulation and Hygiene: Maximal assistance;+2 for physical assistance;Sit to/from stand   Tub/ Shower  Transfer: Maximal assistance;+2 for physical assistance;Stand-pivot;3  in 1     General ADL Comments: Pt completed bed mobility and pivotal steps EOB>recliner. Rw utilized with +2 assist. Extra time and effort to complete transfer. Max A at times to keep RUE off rw (therapist holding pt's hand and advancing left side of rw).      Vision         Perception     Praxis      Pertinent Vitals/Pain Pain Assessment: Faces Faces Pain Scale: Hurts a little bit Pain Location: LUE Pain Descriptors / Indicators: Sore Pain Intervention(s): Monitored during session     Hand Dominance Right   Extremity/Trunk Assessment Upper Extremity Assessment Upper Extremity Assessment: RUE deficits/detail RUE Deficits / Details: s/p triceps tendon repair, LUE in long arm splint. able to flex and extend digits of R hand. Educated pt and spouse on edema control of LUE. Positioned with 2 pillows under right arm while sitting up in recliner.  RUE: Unable to fully assess due to immobilization   Lower Extremity Assessment Lower Extremity Assessment: Defer to PT evaluation       Communication Communication Communication: Other (comment)(speech seeming effortful at times, low volume, spouse presen)   Cognition Arousal/Alertness: Awake/alert Behavior During Therapy: Flat affect;WFL for tasks assessed/performed Overall Cognitive Status: Within Functional Limits for tasks assessed                                     General Comments       Exercises     Shoulder Instructions      Home Living Family/patient expects to be discharged to:: Skilled nursing facility Living Arrangements: Spouse/significant other;Other relatives                               Additional Comments: has adjustable bed, w/c rw. cane. shower seat, grab bars in tub shower. Stairs: 3 to enter then 3+8+8 to get to bedroom.       Prior Functioning/Environment Level of Independence: Independent with assistive device(s)        Comments: occasional use of rw prior to  injury, extra time and effort with ADLs but no physical assist.         OT Problem List: Decreased strength;Decreased activity tolerance;Impaired balance (sitting and/or standing);Decreased coordination;Decreased knowledge of precautions;Decreased knowledge of use of DME or AE;Impaired UE functional use;Pain      OT Treatment/Interventions: Self-care/ADL training;Neuromuscular education;DME and/or AE instruction;Therapeutic activities;Patient/family education;Balance training    OT Goals(Current goals can be found in the care plan section) Acute Rehab OT Goals Patient Stated Goal: rehab then home OT Goal Formulation: With patient/family Time For Goal Achievement: 06/04/17 Potential to Achieve Goals: Good ADL Goals Pt Will Perform Grooming: with min assist;sitting Pt Will Perform Upper Body Bathing: with min assist;sitting Pt Will Perform Lower Body Bathing: with min assist;sit to/from stand Pt Will Perform Upper Body Dressing: with min assist;sitting Pt Will Perform Lower Body Dressing: with min assist;sit to/from stand Pt Will Transfer to Toilet: with min assist;stand pivot transfer Pt Will Perform Toileting - Clothing Manipulation and hygiene: with min assist;sit to/from stand Additional ADL Goal #1: Pt will complete bed mobility at min A level to prepare for OOB ADLs.  OT Frequency: Min 2X/week   Barriers to D/C:            Co-evaluation PT/OT/SLP Co-Evaluation/Treatment:  Yes Reason for Co-Treatment: Complexity of the patient's impairments (multi-system involvement);For patient/therapist safety;To address functional/ADL transfers   OT goals addressed during session: ADL's and self-care      AM-PAC PT "6 Clicks" Daily Activity     Outcome Measure Help from another person eating meals?: A Lot Help from another person taking care of personal grooming?: A Lot Help from another person toileting, which includes using toliet, bedpan, or urinal?: A Lot Help from another person  bathing (including washing, rinsing, drying)?: Total Help from another person to put on and taking off regular upper body clothing?: A Lot Help from another person to put on and taking off regular lower body clothing?: A Lot 6 Click Score: 11   End of Session Equipment Utilized During Treatment: Gait belt;Rolling walker Nurse Communication: Other (comment);Mobility status(spoke with NT regarding assist needed for transfers)  Activity Tolerance: Patient tolerated treatment well;Patient limited by fatigue Patient left: in chair;with call bell/phone within reach;with family/visitor present  OT Visit Diagnosis: Unsteadiness on feet (R26.81);Other abnormalities of gait and mobility (R26.89);Muscle weakness (generalized) (M62.81);History of falling (Z91.81);Pain;Other symptoms and signs involving the nervous system (R29.898) Pain - Right/Left: Right Pain - part of body: Arm                Time: 1157-1230 OT Time Calculation (min): 33 min Charges:  OT General Charges $OT Visit: 1 Visit OT Evaluation $OT Eval Moderate Complexity: 1 Mod G-Codes:       Hortencia Pilar 05/21/2017, 1:13 PM

## 2017-05-21 NOTE — Clinical Social Work Note (Signed)
Clinical Social Work Assessment  Patient Details  Name: Brian [name] MRN: [id number] Date of Birth: 1940-12-20  Date of referral:  05/21/17               Reason for consult:  Facility Placement                Permission sought to share information with:  Chartered certified accountant granted to share information::  Yes, Verbal Permission Granted  Name::     Antwoin Lackey  Agency::  SNF  Relationship::  spouse  Contact Information:     Housing/Transportation Living arrangements for the past 2 months:  [place] of Information:  Patient, Spouse Patient Interpreter Needed:  None Criminal Activity/Legal Involvement Pertinent to Current Situation/Hospitalization:  No - Comment as needed Significant Relationships:  Adult Children, Spouse Lives with:  Spouse Do you feel safe going back to the place where you live?  No Need for family participation in patient care:  Yes (Comment)  Care giving concerns:  Pt has new impairment and will need short term rehab. CSW met with spouse and patient at bedside. Pt has experience with SNF and has experience with [facility] and [facility] in the past. CSW obtained permission to send to SNF's in [employer] CSW will then f/u with spouse with offers and she will select a SNF.  Social Worker assessment / plan:  CSW will f/u for disposition.  Employment status:  Retired Forensic scientist:  Medicare PT Recommendations:  [place] / Referral to community resources:  [place]  Patient/Family's Response to care:  Patient/spouse thanked CSW for meeting and discussing SNF placement and option. They are agreeable to SNF placement. No issues or concerns.  Patient/Family's Understanding of and Emotional Response to Diagnosis, Current Treatment, and Prognosis:  Patient/Spouse have good understanding of limitations and physical barriers. Both agree with recommendations  for SNF and has experience with SNF in the past. Once patient has completed short-term rehab, will return home with spouse. No issues or concerns. CSW will assist with disposition.  Emotional Assessment Appearance:  Appears stated age Attitude/Demeanor/Rapport:  (Cooperative) Affect (typically observed):  Accepting Orientation:  Oriented to Self, Oriented to Place, Oriented to  Time, Oriented to Situation Alcohol / Substance use:  Not Applicable Psych involvement (Current and /or in the community):  No (Comment)  Discharge Needs  Concerns to be addressed:  Discharge Planning Concerns Readmission within the last 30 days:  No Current discharge risk:  Dependent with Mobility, Physical Impairment Barriers to Discharge:  No Barriers Identified   Normajean Baxter, LCSW 05/21/2017, 2:59 PM

## 2017-05-21 NOTE — Discharge Instructions (Signed)
KEEP BANDAGE CLEAN AND DRY CALL OFFICE FOR F/U APPT [phone] in 14 days DR [facility] CELL [phone] DO NOT REMOVE SPLINT CONTACT IF THERE ARE ANY QUESTIONS ABOUT SPLINT AND ARM KEEP HAND ELEVATED ABOVE HEART OK TO APPLY ICE TO OPERATIVE AREA CONTACT OFFICE IF ANY WORSENING PAIN OR CONCERNS.  Information on my medicine - Coumadin   (Warfarin)  Why was Coumadin prescribed for you? Coumadin was prescribed for you because you have a blood clot or a medical condition that can cause an increased risk of forming blood clots. Blood clots can cause serious health problems by blocking the flow of blood to the heart, lung, or brain. Coumadin can prevent harmful blood clots from forming. As a reminder your indication for Coumadin is:   Stroke Prevention Because Of Atrial Fibrillation  What test will check on my response to Coumadin? While on Coumadin (warfarin) you will need to have an INR test regularly to ensure that your dose is keeping you in the desired range. The INR (international normalized ratio) number is calculated from the result of the laboratory test called prothrombin time (PT).  If an INR APPOINTMENT HAS NOT ALREADY BEEN MADE FOR YOU please schedule an appointment to have this lab work done by your health care provider within 7 days. Your INR goal is usually a number between:  2 to 3 or your provider may give you a more narrow range like 2-2.5.  Ask your health care provider during an office visit what your goal INR is.  What  do you need to  know  About  COUMADIN? Take Coumadin (warfarin) exactly as prescribed by your healthcare provider about the same time each day.  DO NOT stop taking without talking to the doctor who prescribed the medication.  Stopping without other blood clot prevention medication to take the place of Coumadin may increase your risk of developing a new clot or stroke.  Get refills before you run out.  What do you do if you miss a dose? If you miss a dose, take it  as soon as you remember on the same day then continue your regularly scheduled regimen the next day.  Do not take two doses of Coumadin at the same time.  Important Safety Information A possible side effect of Coumadin (Warfarin) is an increased risk of bleeding. You should call your healthcare provider right away if you experience any of the following: ? Bleeding from an injury or your nose that does not stop. ? Unusual colored urine (red or dark brown) or unusual colored stools (red or black). ? Unusual bruising for unknown reasons. ? A serious fall or if you hit your head (even if there is no bleeding).  Some foods or medicines interact with Coumadin (warfarin) and might alter your response to warfarin. To help avoid this: ? Eat a balanced diet, maintaining a consistent amount of Vitamin K. ? Notify your provider about major diet changes you plan to make. ? Avoid alcohol or limit your intake to 1 drink for women and 2 drinks for men per day. (1 drink is 5 oz. wine, 12 oz. beer, or 1.5 oz. liquor.)  Make sure that ANY health care provider who prescribes medication for you knows that you are taking Coumadin (warfarin).  Also make sure the healthcare provider who is monitoring your Coumadin knows when you have started a new medication including herbals and non-prescription products.  Coumadin (Warfarin)  Major Drug Interactions  Increased Warfarin Effect Decreased Warfarin Effect  Alcohol (large quantities) Antibiotics (esp. Septra/Bactrim, Flagyl, Cipro) Amiodarone (Cordarone) Aspirin (ASA) Cimetidine (Tagamet) Megestrol (Megace) NSAIDs (ibuprofen, naproxen, etc.) Piroxicam (Feldene) Propafenone (Rythmol SR) Propranolol (Inderal) Isoniazid (INH) Posaconazole (Noxafil) Barbiturates (Phenobarbital) Carbamazepine (Tegretol) Chlordiazepoxide (Librium) Cholestyramine (Questran) Griseofulvin Oral Contraceptives Rifampin Sucralfate (Carafate) Vitamin K   Coumadin (Warfarin) Major  Herbal Interactions  Increased Warfarin Effect Decreased Warfarin Effect  Garlic Ginseng Ginkgo biloba Coenzyme Q10 Green tea St. Johns wort    Coumadin (Warfarin) FOOD Interactions  Eat a consistent number of servings per week of foods HIGH in Vitamin K (1 serving =  cup)  Collards (cooked, or boiled & drained) Kale (cooked, or boiled & drained) Mustard greens (cooked, or boiled & drained) Parsley *serving size only =  cup Spinach (cooked, or boiled & drained) Swiss chard (cooked, or boiled & drained) Turnip greens (cooked, or boiled & drained)  Eat a consistent number of servings per week of foods MEDIUM-HIGH in Vitamin K (1 serving = 1 cup)  Asparagus (cooked, or boiled & drained) Broccoli (cooked, boiled & drained, or raw & chopped) Brussel sprouts (cooked, or boiled & drained) *serving size only =  cup Lettuce, raw (green leaf, endive, romaine) Spinach, raw Turnip greens, raw & chopped   These websites have more information on Coumadin (warfarin):  FailFactory.se; VeganReport.com.au;

## 2017-05-22 LAB — CBC
HCT: 36.9 % — ABNORMAL LOW (ref 39.0–52.0)
Hemoglobin: 12.2 g/dL — ABNORMAL LOW (ref 13.0–17.0)
MCH: 31.5 pg (ref 26.0–34.0)
MCHC: 33.1 g/dL (ref 30.0–36.0)
MCV: 95.3 fL (ref 78.0–100.0)
PLATELETS: 159 10*3/uL (ref 150–400)
RBC: 3.87 MIL/uL — AB (ref 4.22–5.81)
RDW: 13.5 % (ref 11.5–15.5)
WBC: 5.6 10*3/uL (ref 4.0–10.5)

## 2017-05-22 LAB — PROTIME-INR
INR: 2.25
PROTHROMBIN TIME: 24.7 s — AB (ref 11.4–15.2)

## 2017-05-22 MED ORDER — WARFARIN SODIUM 5 MG PO TABS
5.0000 mg | ORAL_TABLET | Freq: Once | ORAL | Status: AC
  Administered 2017-05-22: 5 mg via ORAL
  Filled 2017-05-22: qty 1

## 2017-05-22 MED ORDER — OXYCODONE-ACETAMINOPHEN 10-325 MG PO TABS: 1.0000 | ORAL_TABLET | Freq: Four times a day (QID) | ORAL | 0 refills | Status: AC | PRN

## 2017-05-22 NOTE — Discharge Summary (Signed)
Physician Discharge Summary  Patient ID: Brian [name] MRN: [id number] DOB/AGE: 1940-06-10 77 y.o.  Admit date: 05/20/2017 Discharge date: anticipated 05/23/17  Admission Diagnoses: Right elbow triceps tendon tear Past Medical History:  Diagnosis Date  . Abnormal PFT 1. 05/18/08  2. 11/30/08   1. Showed mild airflow obstruction, mild restriction, mild diffusion defect; FEV1 2.22(64%), FVC 3.33(65%), FEVi% 67, TLC 5.19(69%), DLCO 77%, +BD  2. FEV1 2.38(73%), FVC 3.81(80%), FEV1% 63, TLC 5.61(80%), DLCO 79%, no BD  . Allergic rhinitis   . Anxiety   . BPH (benign prostatic hyperplasia)   . Cellulitis    right leg MRSA  . Erectile dysfunction   . GERD (gastroesophageal reflux disease)   . Heart murmur    mild MR by echo  . Hyperlipidemia   . Hypertension   . Osteoarthritis   . Paresthesias 06/22/2012  . Parkinsonism ([place]) 06/22/2012  . Permanent atrial fibrillation ([place])   . Restrictive lung disease     Discharge Diagnoses:  Active Problems:   Rupture of right triceps tendon   Triceps tendon rupture, right, initial encounter   Surgeries: Procedure(s): TRICEPS TENDON REPAIR on 05/20/2017    Consultants:   Discharged Condition: Improved  Hospital Course: TELL ROZELLE is an 77 y.o. male who was admitted 05/20/2017 with a chief complaint of No chief complaint on file. , and found to have a diagnosis of Right elbow triceps tendon tear.  They were brought to the operating room on 05/20/2017 and underwent Procedure(s): [place].    They were given perioperative antibiotics:  Anti-infectives (From admission, onward)   Start     Dose/Rate Route Frequency Ordered Stop   05/21/17 0600  ceFAZolin (ANCEF) IVPB 2g/100 mL premix     2 g 200 mL/hr over 30 Minutes Intravenous On call to O.R. 05/20/17 1426 05/20/17 1627   05/21/17 0400  ceFAZolin (ANCEF) IVPB 1 g/50 mL premix     1 g 100 mL/hr over 30 Minutes Intravenous Every 8 hours 05/20/17 2005     05/20/17 2015  ceFAZolin  (ANCEF) IVPB 1 g/50 mL premix     1 g 100 mL/hr over 30 Minutes Intravenous NOW 05/20/17 2005 05/20/17 2251   05/20/17 1429  ceFAZolin (ANCEF) 2-4 GM/100ML-% IVPB    Comments:  Schonewitz, Leigh   : cabinet override      05/20/17 1429 05/20/17 1627    .  They were given sequential compression devices, early ambulation, and Other (comment) ambulation for DVT prophylaxis.  Recent vital signs:  Patient Vitals for the past 24 hrs:  BP Temp Temp src Pulse Resp SpO2  05/22/17 0556 136/75 97.7 F (36.5 C) Oral (!) 120 - 100 %  05/22/17 0329 130/76 98.1 F (36.7 C) Oral (!) 45 - 100 %  05/21/17 2114 140/72 99.2 F (37.3 C) Oral 80 18 97 %  05/21/17 1824 (!) 102/54 - - 88 - -  .  Recent laboratory studies: No results found.  Discharge Medications:   Allergies as of 05/22/2017      Reactions   Lisinopril Cough      Medication List    TAKE these medications   ammonium lactate 12 % cream Commonly known as:  LAC-HYDRIN Apply topically as needed for dry skin. As prescribed What changed:    how much to take  additional instructions   carbidopa-levodopa 25-100 MG tablet Commonly known as:  SINEMET IR Take 2 tablets by mouth 4 (four) times daily. What changed:  additional instructions  carbidopa-levodopa 50-200 MG tablet Commonly known as:  SINEMET CR Take 1 tablet by mouth at bedtime. What changed:  Another medication with the same name was changed. Make sure you understand how and when to take each.   cholecalciferol 1000 units tablet Commonly known as:  VITAMIN D Take 1,000 Units by mouth daily. 7 am   clonazePAM 0.5 MG disintegrating tablet Commonly known as:  KLONOPIN Take 1 tablet (0.5 mg total) by mouth at bedtime.   doxazosin 2 MG tablet Commonly known as:  CARDURA Take 2 mg by mouth at bedtime.   escitalopram 10 MG tablet Commonly known as:  LEXAPRO Take 1 tablet (10 mg total) by mouth at bedtime.   famotidine 40 MG tablet Commonly known as:  PEPCID Take  40 mg by mouth every evening. 7 pm   furosemide 20 MG tablet Commonly known as:  LASIX Take 20 mg by mouth 2 (two) times daily. 7 am, 7 pm   gabapentin 100 MG capsule Commonly known as:  NEURONTIN Take 1 capsule (100 mg total) by mouth 3 (three) times daily. What changed:  additional instructions   losartan 50 MG tablet Commonly known as:  COZAAR Take 50 mg by mouth every morning.   metoprolol succinate 25 MG 24 hr tablet Commonly known as:  TOPROL-XL Take 25 mg by mouth every evening. 7 pm   multivitamin with minerals Tabs tablet Take 1 tablet by mouth daily. 7 am   potassium chloride 10 MEQ tablet Commonly known as:  K-DUR,KLOR-CON Take 10 mEq by mouth 2 (two) times daily.   pravastatin 40 MG tablet Commonly known as:  PRAVACHOL Take 40 mg by mouth every morning.   sodium chloride 0.65 % Soln nasal spray Commonly known as:  OCEAN Place 1 spray into both nostrils daily as needed for congestion.   tamsulosin 0.4 MG Caps capsule Commonly known as:  FLOMAX Take 0.4 mg at bedtime by mouth.   vitamin C 500 MG tablet Commonly known as:  ASCORBIC ACID Take 500 mg by mouth every morning.   warfarin 5 MG tablet Commonly known as:  COUMADIN Take as directed. If you are unsure how to take this medication, talk to your nurse or doctor. Original instructions:  TAKE AS DIRECTED BY COUMADIN CLINIC What changed:  See the new instructions.       Diagnostic Studies: Dg Elbow Complete Right  Result Date: 04/27/2017 CLINICAL DATA:  Right elbow pain, fall EXAM: RIGHT ELBOW - COMPLETE 3+ VIEW COMPARISON:  None. FINDINGS: No no dislocation is seen. Large amount of soft tissue swelling over the olecranon and posterior elbow. 15 mm calcific opacity posterior to the distal humerus with irregular appearance of bony spur at the olecranon. IMPRESSION: 1. Large amount of soft tissue swelling over the olecranon and posterior elbow. 15 mm soft tissue calcification posterior to the distal humerus  with irregular appearance of bony spur at the olecranon. Could consider avulsion injury of olecranon spur/triceps tendon given the significant amount of soft tissue swelling. If this is suspected clinically, MRI could be obtained for evaluation. Electronically Signed   By: Donavan Foil M.D.   On: 04/27/2017 03:43   Ct Head Wo Contrast  Result Date: 04/27/2017 CLINICAL DATA:  [place] in bathroom. History of Parkinson's, hypertension, atrial fibrillation. EXAM: CT HEAD WITHOUT CONTRAST TECHNIQUE: Contiguous axial images were obtained from the base of the skull through the vertex without intravenous contrast. COMPARISON:  CT HEAD December 31, 2015 FINDINGS: BRAIN: No intraparenchymal hemorrhage, mass effect nor  midline shift. Moderate ventriculomegaly disproportionate sulcal effacement at the convexities, however normal callosal angle and periventricular contour. Patchy to confluent supratentorial white matter hypodensities. No acute large vascular territory infarcts. No abnormal extra-axial fluid collections. Basal cisterns are patent. VASCULAR: Mild calcific atherosclerosis of the carotid siphons. SKULL: No skull fracture. No significant scalp soft tissue swelling. Small RIGHT frontal scalp lipoma. SINUSES/ORBITS: Trace paranasal sinus mucosal thickening without air-fluid levels. Mastoid air cells are well aerated. Soft tissue RIGHT external auditory canal compatible with cerumen. The included ocular globes and orbital contents are non-suspicious. Status post bilateral ocular lens implants. OTHER: None. IMPRESSION: 1. No acute intracranial process. 2. Moderate parenchymal brain volume loss, equivocal chronic communicating hydrocephalus. 3. Moderate chronic small vessel ischemic disease. Electronically Signed   By: Elon Alas M.D.   On: 04/27/2017 03:59    They benefited maximally from their hospital stay and there were no complications.     Disposition: 01-Home or Self Care   Contact information for  follow-up providers    Iran Planas, MD In 2 weeks.   Specialty:  Orthopedic Surgery Contact information: [address] [place] [address] [phone]            Contact information for after-discharge care    Destination    [facility] .   Service:  Skilled Nursing Contact information: [place] [phone]                   Signed: Brynda Peon 05/22/2017, 4:53 PM

## 2017-05-22 NOTE — Anesthesia Postprocedure Evaluation (Signed)
Anesthesia Post Note  Patient: Brian [name]  Procedure(s) Performed: TRICEPS TENDON REPAIR (Right Elbow)     Patient location during evaluation: PACU Anesthesia Type: General Level of consciousness: awake and alert Pain management: pain level controlled Vital Signs Assessment: post-procedure vital signs reviewed and stable Respiratory status: spontaneous breathing, nonlabored ventilation, respiratory function stable and patient connected to nasal cannula oxygen Cardiovascular status: blood pressure returned to baseline and stable Postop Assessment: no apparent nausea or vomiting Anesthetic complications: no    Last Vitals:  Vitals:   05/22/17 0329 05/22/17 0556  BP: 130/76 136/75  Pulse: (!) 45 (!) 120  Resp:    Temp: 36.7 C 36.5 C  SpO2: 100% 100%    Last Pain:  Vitals:   05/22/17 0556  TempSrc: Oral  PainSc:                  [place] S

## 2017-05-22 NOTE — Progress Notes (Signed)
OT Cancellation Note  Patient Details Name: ALBIN DUCKETT MRN: [id number] DOB: Nov 02, 1940   Cancelled Treatment:     Pt declining OOB this afternoon states "its too late now" RN notified patient remaining in bed at this time. Therapy to continue to follow up  Vonita Moss   OTR/L Pager: [phone] Office: [phone] .  05/22/2017, 2:10 PM

## 2017-05-22 NOTE — Progress Notes (Signed)
ANTICOAGULATION CONSULT NOTE - Initial Consult  Pharmacy Consult for Coumadin Indication: atrial fibrillation  Allergies  Allergen Reactions  . Lisinopril Cough   Patient Measurements: Height: 6\' 2"  (188 cm) Weight: 225 lb (102.1 kg) IBW/kg (Calculated) : 82.2  Assessment: 77 yo M s/p R elbow triceps tendon tear. Coumadin 5mg  daily exc 7.5mg  on Wed/Sat PTA for hx afib. Held 5d before scheduled surgery.  INR on admission: 1.88. Today is INR is up and therapeutic at 2.25. Hgb 12.2, plts wnl.  Goal of Therapy:  INR 2-3 Monitor platelets by anticoagulation protocol: Yes   Plan:  Give Coumadin 5mg  PO x 1 Monitor daily INR, CBC, s/s of bleed  Elenor Quinones, PharmD, [facility] Clinical Pharmacist Pager [phone] 05/22/2017 8:50 AM

## 2017-05-22 NOTE — Progress Notes (Signed)
CCMD notified nurse of patient's cardiac rhythm of Afib with 2.21 sec SVR. Nurse notified cardiology on- call. No orders received.

## 2017-05-22 NOTE — Discharge Summary (Signed)
Physician Discharge Summary  Patient ID: Brian [name] MRN: [id number] DOB/AGE: [birth year] 75 y.o.  Admit date: 05/20/2017 Discharge date:   Admission Diagnoses: Right elbow triceps tendon tear Past Medical History:  Diagnosis Date  . Abnormal PFT 1. 05/18/08  2. 11/30/08   1. Showed mild airflow obstruction, mild restriction, mild diffusion defect; FEV1 2.22(64%), FVC 3.33(65%), FEVi% 67, TLC 5.19(69%), DLCO 77%, +BD  2. FEV1 2.38(73%), FVC 3.81(80%), FEV1% 63, TLC 5.61(80%), DLCO 79%, no BD  . Allergic rhinitis   . Anxiety   . BPH (benign prostatic hyperplasia)   . Cellulitis    right leg MRSA  . Erectile dysfunction   . GERD (gastroesophageal reflux disease)   . Heart murmur    mild MR by echo  . Hyperlipidemia   . Hypertension   . Osteoarthritis   . Paresthesias 06/22/2012  . Parkinsonism ([place]) 06/22/2012  . Permanent atrial fibrillation ([place])   . Restrictive lung disease     Discharge Diagnoses:  Active Problems:   Rupture of right triceps tendon   Triceps tendon rupture, right, initial encounter   Surgeries: Procedure(s): TRICEPS TENDON REPAIR on 05/20/2017    Consultants:   Discharged Condition: Improved  Hospital Course: Brian [name] is an 77 y.o. male who was admitted 05/20/2017 with a chief complaint of No chief complaint on file. , and found to have a diagnosis of Right elbow triceps tendon tear.  They were brought to the operating room on 05/20/2017 and underwent Procedure(s): [place].    They were given perioperative antibiotics:  Anti-infectives (From admission, onward)   Start     Dose/Rate Route Frequency Ordered Stop   05/21/17 0600  ceFAZolin (ANCEF) IVPB 2g/100 mL premix     2 g 200 mL/hr over 30 Minutes Intravenous On call to O.R. 05/20/17 1426 05/20/17 1627   05/21/17 0400  ceFAZolin (ANCEF) IVPB 1 g/50 mL premix     1 g 100 mL/hr over 30 Minutes Intravenous Every 8 hours 05/20/17 2005     05/20/17 2015  ceFAZolin (ANCEF) IVPB 1 g/50  mL premix     1 g 100 mL/hr over 30 Minutes Intravenous NOW 05/20/17 2005 05/20/17 2251   05/20/17 1429  ceFAZolin (ANCEF) 2-4 GM/100ML-% IVPB    Comments:  Schonewitz, Leigh   : cabinet override      05/20/17 1429 05/20/17 1627    .  They were given sequential compression devices, early ambulation, and Other (comment)ambulation for DVT prophylaxis.  Recent vital signs:  Patient Vitals for the past 24 hrs:  BP Temp Temp src Pulse Resp SpO2  05/22/17 0556 136/75 97.7 F (36.5 C) Oral (!) 120 - 100 %  05/22/17 0329 130/76 98.1 F (36.7 C) Oral (!) 45 - 100 %  05/21/17 2114 140/72 99.2 F (37.3 C) Oral 80 18 97 %  05/21/17 1824 (!) 102/54 - - 88 - -  .  Recent laboratory studies: No results found.  Discharge Medications:   Allergies as of 05/22/2017      Reactions   Lisinopril Cough      Medication List    TAKE these medications   ammonium lactate 12 % cream Commonly known as:  LAC-HYDRIN Apply topically as needed for dry skin. As prescribed What changed:    how much to take  additional instructions   carbidopa-levodopa 25-100 MG tablet Commonly known as:  SINEMET IR Take 2 tablets by mouth 4 (four) times daily. What changed:  additional instructions   carbidopa-levodopa  50-200 MG tablet Commonly known as:  SINEMET CR Take 1 tablet by mouth at bedtime. What changed:  Another medication with the same name was changed. Make sure you understand how and when to take each.   cholecalciferol 1000 units tablet Commonly known as:  VITAMIN D Take 1,000 Units by mouth daily. 7 am   clonazePAM 0.5 MG disintegrating tablet Commonly known as:  KLONOPIN Take 1 tablet (0.5 mg total) by mouth at bedtime.   doxazosin 2 MG tablet Commonly known as:  CARDURA Take 2 mg by mouth at bedtime.   escitalopram 10 MG tablet Commonly known as:  LEXAPRO Take 1 tablet (10 mg total) by mouth at bedtime.   famotidine 40 MG tablet Commonly known as:  PEPCID Take 40 mg by mouth every  evening. 7 pm   furosemide 20 MG tablet Commonly known as:  LASIX Take 20 mg by mouth 2 (two) times daily. 7 am, 7 pm   gabapentin 100 MG capsule Commonly known as:  NEURONTIN Take 1 capsule (100 mg total) by mouth 3 (three) times daily. What changed:  additional instructions   losartan 50 MG tablet Commonly known as:  COZAAR Take 50 mg by mouth every morning.   metoprolol succinate 25 MG 24 hr tablet Commonly known as:  TOPROL-XL Take 25 mg by mouth every evening. 7 pm   multivitamin with minerals Tabs tablet Take 1 tablet by mouth daily. 7 am   oxyCODONE-acetaminophen 10-325 MG tablet Commonly known as:  PERCOCET Take 1 tablet by mouth every 6 (six) hours as needed for up to 7 days for pain. Start taking on:  05/23/2017   potassium chloride 10 MEQ tablet Commonly known as:  K-DUR,KLOR-CON Take 10 mEq by mouth 2 (two) times daily.   pravastatin 40 MG tablet Commonly known as:  PRAVACHOL Take 40 mg by mouth every morning.   sodium chloride 0.65 % Soln nasal spray Commonly known as:  OCEAN Place 1 spray into both nostrils daily as needed for congestion.   tamsulosin 0.4 MG Caps capsule Commonly known as:  FLOMAX Take 0.4 mg at bedtime by mouth.   vitamin C 500 MG tablet Commonly known as:  ASCORBIC ACID Take 500 mg by mouth every morning.   warfarin 5 MG tablet Commonly known as:  COUMADIN Take as directed. If you are unsure how to take this medication, talk to your nurse or doctor. Original instructions:  TAKE AS DIRECTED BY COUMADIN CLINIC What changed:  See the new instructions.       Diagnostic Studies: Dg Elbow Complete Right  Result Date: 04/27/2017 CLINICAL DATA:  Right elbow pain, fall EXAM: RIGHT ELBOW - COMPLETE 3+ VIEW COMPARISON:  None. FINDINGS: No no dislocation is seen. Large amount of soft tissue swelling over the olecranon and posterior elbow. 15 mm calcific opacity posterior to the distal humerus with irregular appearance of bony spur at the  olecranon. IMPRESSION: 1. Large amount of soft tissue swelling over the olecranon and posterior elbow. 15 mm soft tissue calcification posterior to the distal humerus with irregular appearance of bony spur at the olecranon. Could consider avulsion injury of olecranon spur/triceps tendon given the significant amount of soft tissue swelling. If this is suspected clinically, MRI could be obtained for evaluation. Electronically Signed   By: Donavan Foil M.D.   On: 04/27/2017 03:43   Ct Head Wo Contrast  Result Date: 04/27/2017 CLINICAL DATA:  [address] in bathroom. History of Parkinson's, hypertension, atrial fibrillation. EXAM: CT HEAD WITHOUT CONTRAST  TECHNIQUE: Contiguous axial images were obtained from the base of the skull through the vertex without intravenous contrast. COMPARISON:  CT HEAD December 31, 2015 FINDINGS: BRAIN: No intraparenchymal hemorrhage, mass effect nor midline shift. Moderate ventriculomegaly disproportionate sulcal effacement at the convexities, however normal callosal angle and periventricular contour. Patchy to confluent supratentorial white matter hypodensities. No acute large vascular territory infarcts. No abnormal extra-axial fluid collections. Basal cisterns are patent. VASCULAR: Mild calcific atherosclerosis of the carotid siphons. SKULL: No skull fracture. No significant scalp soft tissue swelling. Small RIGHT frontal scalp lipoma. SINUSES/ORBITS: Trace paranasal sinus mucosal thickening without air-fluid levels. Mastoid air cells are well aerated. Soft tissue RIGHT external auditory canal compatible with cerumen. The included ocular globes and orbital contents are non-suspicious. Status post bilateral ocular lens implants. OTHER: None. IMPRESSION: 1. No acute intracranial process. 2. Moderate parenchymal brain volume loss, equivocal chronic communicating hydrocephalus. 3. Moderate chronic small vessel ischemic disease. Electronically Signed   By: Elon Alas M.D.   On: 04/27/2017  03:59    They benefited maximally from their hospital stay and there were no complications.     Disposition: 01-Home or Self Care   Contact information for follow-up providers    Iran Planas, MD In 2 weeks.   Specialty:  Orthopedic Surgery Contact information: [address] [place] [address] [phone]            Contact information for after-discharge care    Destination    [facility] .   Service:  Skilled Nursing Contact information: [place] [phone]                   Signed: Brynda Peon 05/22/2017, 5:10 PM

## 2017-05-22 NOTE — Progress Notes (Signed)
Subjective: 2 Days Post-Op Procedure(s) (LRB): TRICEPS TENDON REPAIR (Right) Patient reports pain as 1 on 0-10 scale.   [name] is laying comfortable in bed with the right arm elevated. He notes mild pain that is well-controlled. He has been able to tolerate meals but has not had a bowel movement. He has been up and moving as tolerated. He denies fever, chills, numbness, tingling, shortness of breath or chest pain. He feels that he is comfortable in the splint.  Objective: Vital signs in last 24 hours: Temp:  [97.7 F (36.5 C)-99.2 F (37.3 C)] 97.7 F (36.5 C) (03/01 0556) Pulse Rate:  [45-120] 120 (03/01 0556) Resp:  [18] 18 (02/28 2114) BP: (102-140)/(54-76) 136/75 (03/01 0556) SpO2:  [97 %-100 %] 100 % (03/01 0556)  Intake/Output from previous day: 02/28 0701 - 03/01 0700 In: -  Out: 600 [Urine:600] Intake/Output this shift: No intake/output data recorded.  Recent Labs    05/20/17 1442 05/22/17 0612  HGB 12.7* 12.2*   Recent Labs    05/20/17 1442 05/22/17 0612  WBC 3.4* 5.6  RBC 3.99* 3.87*  HCT 38.3* 36.9*  PLT 185 159   Recent Labs    05/20/17 1442  NA 140  K 4.0  CL 104  CO2 24  BUN 16  CREATININE 1.03  GLUCOSE 74  CALCIUM 9.4   Recent Labs    05/21/17 0603 05/22/17 0612  INR 1.91 2.25   Alert and oriented x 3 Neurovascular intact Sensation intact distally Intact pulses distally Splint is clean, dry and intact.   Able to wiggle all fingers with no difficulty.  Assessment/Plan: 2 Days Post-Op Procedure(s) (LRB): TRICEPS TENDON REPAIR (Right)  Continue with splint keeping it clean and dry.  Continue with pain management as needed.  Advance diet Plan for discharge tomorrow Discharge to [place] 05/22/2017, 4:48 PM

## 2017-05-22 NOTE — Progress Notes (Signed)
Occupational Therapy Treatment Patient Details Name: Brian [name] MRN: [id number] DOB: 1940-06-08 Today's Date: 05/22/2017    History of present illness 77 year old right-hand dominant male who sustained an injury to the right arm on 04/27/17. Now s/p Right elbow triceps tendon repair and tenolysis. PMH significant for Parkinsonism, permanent atrial fibrillation, h/o B TKA.    OT comments  Pt progressing towards acute OT goals. Focus of session was positioning of RUE in elevation using pillows, also reviewed edema control and UB ADL education. Spouse present and involved in session. D/c plan remains appropriate.   Follow Up Recommendations  SNF    Equipment Recommendations  Other (comment)    Recommendations for Other Services      Precautions / Restrictions Precautions Precautions: Fall Precaution Comments: Pt reports left side is usually his strong side Required Braces or Orthoses: Other Brace/Splint Restrictions Weight Bearing Restrictions: Yes RUE Weight Bearing: Non weight bearing       Mobility Bed Mobility                  Transfers                      Balance                                           ADL either performed or assessed with clinical judgement   ADL                                         General ADL Comments: Positioned pillows under RUE for elevation. Reviewed edema control and UB dressing technique.      Vision       Perception     Praxis      Cognition Arousal/Alertness: Awake/alert Behavior During Therapy: Flat affect;WFL for tasks assessed/performed Overall Cognitive Status: Within Functional Limits for tasks assessed                                          Exercises     Shoulder Instructions       General Comments      Pertinent Vitals/ Pain       Pain Assessment: Faces Faces Pain Scale: Hurts a little bit Pain Location: LUE Pain Descriptors /  Indicators: Sore Pain Intervention(s): Monitored during session  Home Living                                          Prior Functioning/Environment              Frequency  Min 2X/week        Progress Toward Goals  OT Goals(current goals can now be found in the care plan section)  Progress towards OT goals: Progressing toward goals  Acute Rehab OT Goals Patient Stated Goal: rehab then home OT Goal Formulation: With patient/family Time For Goal Achievement: 06/04/17 Potential to Achieve Goals: Good ADL Goals Pt Will Perform Grooming: with min assist;sitting Pt Will Perform Upper Body Bathing: with min assist;sitting Pt Will Perform Lower Body Bathing: with min assist;sit  to/from stand Pt Will Perform Upper Body Dressing: with min assist;sitting Pt Will Perform Lower Body Dressing: with min assist;sit to/from stand Pt Will Transfer to Toilet: with min assist;stand pivot transfer Pt Will Perform Toileting - Clothing Manipulation and hygiene: with min assist;sit to/from stand Additional ADL Goal #1: Pt will complete bed mobility at min A level to prepare for OOB ADLs.  Plan Discharge plan remains appropriate    Co-evaluation                 AM-PAC PT "6 Clicks" Daily Activity     Outcome Measure   Help from another person eating meals?: A Lot Help from another person taking care of personal grooming?: A Lot Help from another person toileting, which includes using toliet, bedpan, or urinal?: A Lot Help from another person bathing (including washing, rinsing, drying)?: Total Help from another person to put on and taking off regular upper body clothing?: A Lot Help from another person to put on and taking off regular lower body clothing?: A Lot 6 Click Score: 11    End of Session    OT Visit Diagnosis: Unsteadiness on feet (R26.81);Other abnormalities of gait and mobility (R26.89);Muscle weakness (generalized) (M62.81);History of falling  (Z91.81);Pain;Other symptoms and signs involving the nervous system (R29.898) Pain - Right/Left: Right Pain - part of body: Arm   Activity Tolerance Patient tolerated treatment well   Patient Left in bed;with call bell/phone within reach;with family/visitor present   Nurse Communication Other (comment);Mobility status(Pt would like to get up in recliner this afternoon.)        Time: 1112-1120 OT Time Calculation (min): 8 min  Charges: OT General Charges $OT Visit: 1 Visit OT Treatments $Self Care/Home Management : 8-22 mins    Hortencia Pilar 05/22/2017, 11:45 AM

## 2017-05-22 NOTE — Clinical Social Work Note (Signed)
Provided bed offers to pt and pt's spouse. Pt's spouse to visit facilities and call CSW with choice.   [place], [place]

## 2017-05-23 DIAGNOSIS — J984 Other disorders of lung: Secondary | ICD-10-CM | POA: Diagnosis not present

## 2017-05-23 DIAGNOSIS — R2689 Other abnormalities of gait and mobility: Secondary | ICD-10-CM | POA: Diagnosis not present

## 2017-05-23 DIAGNOSIS — S46391S Other injury of muscle, fascia and tendon of triceps, right arm, sequela: Secondary | ICD-10-CM | POA: Diagnosis not present

## 2017-05-23 DIAGNOSIS — I10 Essential (primary) hypertension: Secondary | ICD-10-CM | POA: Diagnosis not present

## 2017-05-23 DIAGNOSIS — I4891 Unspecified atrial fibrillation: Secondary | ICD-10-CM | POA: Diagnosis not present

## 2017-05-23 DIAGNOSIS — M1990 Unspecified osteoarthritis, unspecified site: Secondary | ICD-10-CM | POA: Diagnosis not present

## 2017-05-23 DIAGNOSIS — G20 Parkinson's disease: Secondary | ICD-10-CM | POA: Diagnosis not present

## 2017-05-23 DIAGNOSIS — R41841 Cognitive communication deficit: Secondary | ICD-10-CM | POA: Diagnosis not present

## 2017-05-23 DIAGNOSIS — Z9889 Other specified postprocedural states: Secondary | ICD-10-CM | POA: Diagnosis not present

## 2017-05-23 DIAGNOSIS — M6281 Muscle weakness (generalized): Secondary | ICD-10-CM | POA: Diagnosis not present

## 2017-05-23 DIAGNOSIS — M779 Enthesopathy, unspecified: Secondary | ICD-10-CM | POA: Diagnosis not present

## 2017-05-23 DIAGNOSIS — S46391D Other injury of muscle, fascia and tendon of triceps, right arm, subsequent encounter: Secondary | ICD-10-CM | POA: Diagnosis not present

## 2017-05-23 DIAGNOSIS — Z4789 Encounter for other orthopedic aftercare: Secondary | ICD-10-CM | POA: Diagnosis not present

## 2017-05-23 DIAGNOSIS — R488 Other symbolic dysfunctions: Secondary | ICD-10-CM | POA: Diagnosis not present

## 2017-05-23 DIAGNOSIS — G8911 Acute pain due to trauma: Secondary | ICD-10-CM | POA: Diagnosis not present

## 2017-05-23 LAB — PROTIME-INR
INR: 2.28
PROTHROMBIN TIME: 24.9 s — AB (ref 11.4–15.2)

## 2017-05-23 MED ORDER — WARFARIN SODIUM 7.5 MG PO TABS: 7.5000 mg | ORAL_TABLET | Freq: Once | ORAL | Status: DC

## 2017-05-23 NOTE — Plan of Care (Signed)
  Nutrition: Adequate nutrition will be maintained 05/23/2017 1027 - Progressing by Williams Che, RN   Elimination: Will not experience complications related to bowel motility 05/23/2017 1027 - Progressing by Williams Che, RN   Pain Managment: General experience of comfort will improve 05/23/2017 1027 - Progressing by Williams Che, RN   Safety: Ability to remain free from injury will improve 05/23/2017 1027 - Progressing by Williams Che, RN

## 2017-05-23 NOTE — Progress Notes (Signed)
Removed IV, all questions and concerns addressed, Pt not in distress, called facility and gave report to Ramona, Pt to discharge to facility via [facility], wife notified.

## 2017-05-23 NOTE — Clinical Social Work Placement (Signed)
   CLINICAL SOCIAL WORK PLACEMENT  NOTE  Date:  05/23/2017  Patient Details  Name: Brian [name] MRN: [id number] Date of Birth: Nov 27, 1940  Clinical Social Work is seeking post-discharge placement for this patient at the [place] level of care (*CSW will initial, date and re-position this form in  chart as items are completed):  Yes   Patient/family provided with [place] Work Department's list of facilities offering this level of care within the geographic area requested by the patient (or if unable, by the patient's family).  Yes   Patient/family informed of their freedom to choose among providers that offer the needed level of care, that participate in Medicare, Medicaid or managed care program needed by the patient, have an available bed and are willing to accept the patient.  Yes   Patient/family informed of [facility]'s ownership interest in [facility] and [facility], as well as of the fact that they are under no obligation to receive care at these facilities.  PASRR submitted to EDS on       PASRR number received on       Existing PASRR number confirmed on 05/21/17     FL2 transmitted to all facilities in geographic area requested by pt/family on       FL2 transmitted to all facilities within larger geographic area on 05/21/17     Patient informed that his/her managed care company has contracts with or will negotiate with certain facilities, including the following:        Yes   Patient/family informed of bed offers received.  Patient chooses bed at [facility]     Physician recommends and patient chooses bed at      Patient to be transferred to [facility] on 05/23/17.  Patient to be transferred to facility by PTAR     Patient family notified on 05/23/17 of transfer.  Name of family member notified:  spouse contacted     PHYSICIAN       Additional Comment:     _______________________________________________ Eileen Stanford, LCSW 05/23/2017, 10:06 AM

## 2017-05-23 NOTE — Clinical Social Work Note (Signed)
Clinical Social Worker facilitated patient discharge including contacting patient family and facility to confirm patient discharge plans.  Clinical information faxed to facility and family agreeable with plan.  CSW arranged ambulance transport via [facility] to [place].  RN to call Donnald Garre [phone] for report prior to discharge. Patient will go to room 1108 P.  Clinical Social Worker will sign off for now as social work intervention is no longer needed. Please consult [place] again if new need arises.  [place], [place]

## 2017-05-26 DIAGNOSIS — M1990 Unspecified osteoarthritis, unspecified site: Secondary | ICD-10-CM | POA: Diagnosis not present

## 2017-05-26 DIAGNOSIS — G20 Parkinson's disease: Secondary | ICD-10-CM | POA: Diagnosis not present

## 2017-05-26 DIAGNOSIS — Z9889 Other specified postprocedural states: Secondary | ICD-10-CM | POA: Diagnosis not present

## 2017-05-26 DIAGNOSIS — I10 Essential (primary) hypertension: Secondary | ICD-10-CM | POA: Diagnosis not present

## 2017-06-10 ENCOUNTER — Telehealth: Payer: Self-pay | Admitting: Cardiology

## 2017-06-10 ENCOUNTER — Ambulatory Visit: Payer: Medicare Other | Admitting: Podiatry

## 2017-06-10 NOTE — Telephone Encounter (Signed)
I informed Brian Laster, RN from [facility] that we have received the order for Dr. Radford Pax. I explained that Dr. Radford Pax is not in the office and I will get Dr. Radford Pax to sign once she returns and get it faxed to you. She verbalized understanding and thankful for the call

## 2017-06-10 NOTE — Telephone Encounter (Signed)
Returned call to [facility], receptionist stated that she had stepped away or a moment and would get Mariann Laster to call back.

## 2017-06-10 NOTE — Telephone Encounter (Signed)
New Message   Mariann Laster at [facility] is calling about an outstanding order that Brian [name] was suppose to sign and date so they can close out his chart. Please call

## 2017-06-11 DIAGNOSIS — Z9889 Other specified postprocedural states: Secondary | ICD-10-CM | POA: Diagnosis not present

## 2017-06-11 DIAGNOSIS — I10 Essential (primary) hypertension: Secondary | ICD-10-CM | POA: Diagnosis not present

## 2017-06-11 DIAGNOSIS — I4891 Unspecified atrial fibrillation: Secondary | ICD-10-CM | POA: Diagnosis not present

## 2017-06-11 DIAGNOSIS — J984 Other disorders of lung: Secondary | ICD-10-CM | POA: Diagnosis not present

## 2017-06-23 DIAGNOSIS — Z9889 Other specified postprocedural states: Secondary | ICD-10-CM | POA: Diagnosis not present

## 2017-06-23 DIAGNOSIS — I10 Essential (primary) hypertension: Secondary | ICD-10-CM | POA: Diagnosis not present

## 2017-06-23 DIAGNOSIS — I4891 Unspecified atrial fibrillation: Secondary | ICD-10-CM | POA: Diagnosis not present

## 2017-07-28 DIAGNOSIS — G20 Parkinson's disease: Secondary | ICD-10-CM | POA: Diagnosis not present

## 2017-07-28 DIAGNOSIS — I4891 Unspecified atrial fibrillation: Secondary | ICD-10-CM | POA: Diagnosis not present

## 2017-07-28 DIAGNOSIS — J984 Other disorders of lung: Secondary | ICD-10-CM | POA: Diagnosis not present

## 2017-07-28 DIAGNOSIS — Z9889 Other specified postprocedural states: Secondary | ICD-10-CM | POA: Diagnosis not present

## 2017-08-03 DIAGNOSIS — I10 Essential (primary) hypertension: Secondary | ICD-10-CM | POA: Diagnosis not present

## 2017-08-03 DIAGNOSIS — M1990 Unspecified osteoarthritis, unspecified site: Secondary | ICD-10-CM | POA: Diagnosis not present

## 2017-08-03 DIAGNOSIS — Z9889 Other specified postprocedural states: Secondary | ICD-10-CM | POA: Diagnosis not present

## 2017-08-03 DIAGNOSIS — G20 Parkinson's disease: Secondary | ICD-10-CM | POA: Diagnosis not present

## 2017-08-07 ENCOUNTER — Other Ambulatory Visit: Payer: Self-pay | Admitting: Cardiology

## 2017-08-11 DIAGNOSIS — G20 Parkinson's disease: Secondary | ICD-10-CM | POA: Diagnosis not present

## 2017-08-11 DIAGNOSIS — I4891 Unspecified atrial fibrillation: Secondary | ICD-10-CM | POA: Diagnosis not present

## 2017-08-11 DIAGNOSIS — Z9889 Other specified postprocedural states: Secondary | ICD-10-CM | POA: Diagnosis not present

## 2017-08-11 DIAGNOSIS — I10 Essential (primary) hypertension: Secondary | ICD-10-CM | POA: Diagnosis not present

## 2017-08-16 DIAGNOSIS — G20 Parkinson's disease: Secondary | ICD-10-CM | POA: Diagnosis not present

## 2017-08-16 DIAGNOSIS — N401 Enlarged prostate with lower urinary tract symptoms: Secondary | ICD-10-CM | POA: Diagnosis not present

## 2017-08-16 DIAGNOSIS — Z7901 Long term (current) use of anticoagulants: Secondary | ICD-10-CM | POA: Diagnosis not present

## 2017-08-16 DIAGNOSIS — I129 Hypertensive chronic kidney disease with stage 1 through stage 4 chronic kidney disease, or unspecified chronic kidney disease: Secondary | ICD-10-CM | POA: Diagnosis not present

## 2017-08-16 DIAGNOSIS — N182 Chronic kidney disease, stage 2 (mild): Secondary | ICD-10-CM | POA: Diagnosis not present

## 2017-08-16 DIAGNOSIS — E785 Hyperlipidemia, unspecified: Secondary | ICD-10-CM | POA: Diagnosis not present

## 2017-08-16 DIAGNOSIS — S46391D Other injury of muscle, fascia and tendon of triceps, right arm, subsequent encounter: Secondary | ICD-10-CM | POA: Diagnosis not present

## 2017-08-16 DIAGNOSIS — N529 Male erectile dysfunction, unspecified: Secondary | ICD-10-CM | POA: Diagnosis not present

## 2017-08-16 DIAGNOSIS — I482 Chronic atrial fibrillation: Secondary | ICD-10-CM | POA: Diagnosis not present

## 2017-08-16 DIAGNOSIS — Z96653 Presence of artificial knee joint, bilateral: Secondary | ICD-10-CM | POA: Diagnosis not present

## 2017-08-16 DIAGNOSIS — R339 Retention of urine, unspecified: Secondary | ICD-10-CM | POA: Diagnosis not present

## 2017-08-16 DIAGNOSIS — J984 Other disorders of lung: Secondary | ICD-10-CM | POA: Diagnosis not present

## 2017-08-18 ENCOUNTER — Telehealth: Payer: Self-pay

## 2017-08-18 ENCOUNTER — Telehealth: Payer: Self-pay | Admitting: Cardiology

## 2017-08-18 ENCOUNTER — Encounter: Payer: Self-pay | Admitting: Neurology

## 2017-08-18 ENCOUNTER — Ambulatory Visit (INDEPENDENT_AMBULATORY_CARE_PROVIDER_SITE_OTHER): Payer: Medicare Other | Admitting: Neurology

## 2017-08-18 VITALS — BP 132/87 | HR 79 | Ht 74.0 in | Wt 232.0 lb

## 2017-08-18 DIAGNOSIS — G4752 REM sleep behavior disorder: Secondary | ICD-10-CM

## 2017-08-18 DIAGNOSIS — G20 Parkinson's disease: Secondary | ICD-10-CM | POA: Diagnosis not present

## 2017-08-18 DIAGNOSIS — F419 Anxiety disorder, unspecified: Secondary | ICD-10-CM

## 2017-08-18 DIAGNOSIS — R413 Other amnesia: Secondary | ICD-10-CM

## 2017-08-18 DIAGNOSIS — F418 Other specified anxiety disorders: Secondary | ICD-10-CM

## 2017-08-18 MED ORDER — CLONAZEPAM 0.5 MG PO TBDP: 0.5000 mg | ORAL_TABLET | Freq: Every day | ORAL | 3 refills | Status: DC

## 2017-08-18 MED ORDER — CARBIDOPA-LEVODOPA ER 50-200 MG PO TBCR: 1.0000 | EXTENDED_RELEASE_TABLET | Freq: Every day | ORAL | 3 refills | Status: DC

## 2017-08-18 MED ORDER — CARBIDOPA-LEVODOPA 25-100 MG PO TABS: 2.0000 | ORAL_TABLET | Freq: Four times a day (QID) | ORAL | 3 refills | Status: DC

## 2017-08-18 MED ORDER — ESCITALOPRAM OXALATE 10 MG PO TABS: 10.0000 mg | ORAL_TABLET | Freq: Every day | ORAL | 3 refills | Status: DC

## 2017-08-18 NOTE — Telephone Encounter (Signed)
New Message    Per wife patient is taking 5mg  of coumadin - taking everyday except Wednesday and Saturday and on those days he is taking 7.5  Do you want [facility] to monitor his coumadin from home?

## 2017-08-18 NOTE — Patient Instructions (Addendum)
Please call if you don't hear from [facility] in about a week, regarding your [employer] walker.   You can use your lift chair, but try not to rely on it all the time.   Use your walker at all times.   We will monitor your memory. We will do a memory questionnaire next time. We will keep your medications the same at this point, follow-up in 4 months.   Please stay well hydrated with water.

## 2017-08-18 NOTE — Telephone Encounter (Signed)
Pt was discharged from rehab on Thursday 08/13/17, last INR was 08/10/17 at [facility] dosage was 6mg  QD. Resumed previous dosage regimen at home secondary to pt did not have 6mg  tablets, currently taking 5mg  QD except 7.5mg  Wed, Sat.  Gave verbal order to check INR on 08/19/17 and call results to [facility] directly while in pt's home.

## 2017-08-18 NOTE — Telephone Encounter (Signed)
Received this notice from [facility]: "Unfortunately we do not carry a [employer]-Step walker. I have seen a few requests for these lately and only see that they are available online (either directly from [employer] or [employer]). "  I called pt's wife, per DPR, and explained this to her. She asked me if it was covered through insurance. It appears that the office can fax a prescription with office notes and pt's insurance information to the u-step company and they will check insurance coverage and get back to the pt. Pt's wife is agreeable to this plan.

## 2017-08-18 NOTE — Progress Notes (Signed)
Subjective:    Patient ID: Brian [name] is a 77 y.o. male.  HPI     Interim history:   [name] is a 77 year old right-handed gentleman with an underlying medical history of atrial fibrillation, hypertension, OSA, and hyperlipidemia who presents for followup consultation of his right-sided predominant Parkinson's disease complicated by bladder hyperactivity, anxiety , mobility limitations secondary to arthritis, and RBD. He is accompanied by his wife again today. I last saw him on 05/18/2017, at which time he reported a recent fall with injury to right elbow. He also bumped his head. He fell in the bathroom. He went to the emergency room and I reviewed records at the time. He had a head CT without contrast which showed no acute process, moderate volume loss, moderate chronic small vessel disease. He needed surgery to his right arm under orthopedics.  Today, 08/18/2017 (all dictated new, as well as above notes, some dictation done in note pad or Word, outside of chart, may appear as copied):    He reports doing fairly well, recently discharge from inpatient rehabilitation at [facility]. He has home health nursing and PT and probably also home health OT coming up.he has a follow-up with his orthopedic surgeon soon.no recent falls are reported. Wife has noticed some mild memory issues, forgetfulness primarily, patient endorses this as well. Of note, he had right triceps tendon repair on 05/20/2017 and subsequent rehabilitation. no hallucinations are reported, no significant constipation thankfully. Has ongoing issues with freezing from time to time and some dyskinesias per wife's report.    The patient's allergies, current medications, family history, past medical history, past social history, past surgical history and problem list were reviewed and updated as appropriate.    Previously (copied from previous notes for reference):    I saw him on 01/12/2017, at which time he reported to recent  falls. He was trying to work out on his own and was supposed to start working with a Physiological scientist.       I saw him on 07/09/2016, at which time he was fairly stable.     I saw him on 03/06/2016 at which time he reported doing better, he had some residual right knee pain. He had an appointment with his orthopedic surgeon. He reported one fall that led to an ER visit via EMS. He also had an admission to the hospital in October 2017 secondary to nausea, vomiting, urinary retention. He was noted to have altered mental status and acute kidney injury. He had a head CT without contrast on 12/31/2015 which showed:    1.  No acute intracranial abnormality. 2. Mild increase in generalized atrophy from 2013. Chronic small vessel ischemia is stable.   He was in the hospital for 4 days, then at [facility] in rehabilitation for about 10 days. He has ongoing issues with particularly right leg stiffness, no additional falls, does not appear to be very motivated to exercise. He had been in the PD boxing class. I suggested we continue with Sinemet 2 pills 4 times a day, Sinemet CR at bedtime, gabapentin, clonazepam for RBD at night and the only change was an increase in Lexapro from 5 mg to 10 mg daily.   I saw him on 10/22/2015, at which time he reported worse mobility, more stiffness in the right leg, more freezing episodes, difficulty getting out of bed especially at night when he would have to go to the bathroom. His wife would have to help him. Thankfully, he had no  recent falls and was using a cane. He had finished physical therapy was not using his walker. He was not exercising very much to. He was more sedentary. He had right total knee replacement on 07/12/2015. I suggested we increase his Sinemet to 2 pills 4 times a day. We kept his gabapentin the same. We kept the Sinemet CR at bedtime the same.   I saw him on 04/26/2015, at which time he reported taking carbidopa/levodopa 25-100 milligrams strength,  about 7 pills per day, 2 for the 1st dose, 1 1/2 pills for the 2nd and 4 th dose, 2 for the 3rd dose, and 1 CR at night. He finished physical therapy and was able to walk without a cane. He reported right knee pain and indicated that he may need right knee surgery as well.   I saw him on 01/18/2015, at which time he reported doing better slowly. He had finished home health physical therapy. He was supposed to start outpatient physical therapy through orthopedics. He had left total knee arthroplasty on 11/30/2014. I reviewed the operative note as well as his discharge summary. He was discharged on 12/03/2014 to inpatient rehabilitation. Of note, he presented to the emergency room on 12/16/2014 secondary to repeated vomiting. This was deemed secondary to constipation. Cardiac enzymes were tested which were negative for any acute coronary syndrome. I reviewed the emergency room records. He had blood work which I reviewed. INR was 2.59. Hemoglobin was slightly low at 11.9 and hematocrit was 34.6. Troponin was negative 2. He overall felt a little better since we increased his Sinemet. He was taking a long-acting Sinemet CR at night. He had some off time in the middle of the night.  He felt he was making slow progress after his knee replacement surgery. He had some issues with edema. He was wearing compression stockings for this. He was also placed on furosemide for a little while. He was no longer on narcotic pain medications. He was using a cane at the time. I suggested we continue with his medication regimen, he was alternating Sinemet 2 pills with 1-1/2 pills for a total of 4 doses a day.   He missed an appointment on 12/19/2014. I saw him on 08/22/2014, at which time he reported bilateral knee pain. He had seen Dr. Tonita Cong for his knee arthritis and had undergone injections into both knees. He did not think the injections helped a lot. He was exploring knee replacement surgery. He was having more difficulty  getting out of chairs. He had no freezing. He did think that the increase in Sinemet had helped in the past. He was alternating 1 pill with 1-1/2 pills. He was taking Sinemet CR at night. He was on gabapentin 100 mg 3 times a day and Lexapro 5 mg daily as well as clonazepam 0.5 mg each night. He had no new memory or mood issues. He was overall moving slower. He had no recent falls.   In the interim, his wife called on 01/04/2015 reported that he had worsening symptoms. I suggested we increase his Sinemet slightly to 2 tablets alternating with 1-1/2 tablets.   I saw him on 06/01/2014, at which time he reported overall doing well. He was not able to sleep through the night. He was consistently waking up between 3 and 4 AM. He had nocturia twice on average. He felt improved from the bladder medication but still reported disrupted sleep. He felt clonazepam has helped. His wife agreed. He also felt that the increase  in Sinemet was helpful in his motor function. It was difficult for him to keep up with the 5 pills a day schedule. He was going to the [facility] 3 times a week for about 30 minutes. Lexapro has helped his mood. His wife felt that he was less anxious. I suggested we increase his clonazepam to 0.5 mg each night. I suggested we change his Sinemet to 1 pill alternating with one half pills for a total of 5 pills daily but for doses. I suggested he continue with Sinemet CR at bedtime.   I saw him on 03/27/2014, at which time he reported sleeping a little bit better but his tremor was worse. He did not feel Sinemet was lasting him 4 hours in between 2 different doses. He was going to the gym twice a week. He was back on an antidepressant. He was supposed to start a new bladder medication. He never actually started clonazepam for RBD. I prescribed clonazepam for him for RBD. I increased his Sinemet to one fill 5 times a day.   I saw him on 12/01/2013, at which time he reported, that he stopped Myrbetric due to new  onset facial swelling, but this was no better after stopping it. His facial swelling may have started after the lexapro. Adding long-acting Sinemet helped him sleep a little bit longer up to 3 AM. I suggested he stop Lexapro because of a possible allergic reaction to it. I also added low-dose clonazepam for his RBD and for his sleep.   I saw him on 09/29/2013 at which time he presented for a sooner than scheduled appointment because of increasing tremors and parkinsonian symptoms. He also felt more anxious but not frankly depressed. His wife felt that he may have been depressed. She reported dream enactments. She also reported loud snoring. He had a sleep study over 5 years ago, and that was negative for OSA at the time. He had started Myrbetriq recently. He has been taking gabapentin 3 times a day, and 7, 11 and 3. He takes Sinemet regularly at 7, 11, 3, and 7. I suggested that he start low-dose Lexapro at 5 mg strength. I changed the timing of his gabapentin to one pill at 7 AM, 1 pill at 11 AM and one pill at 7 PM. I asked him to continue with Sinemet 4 times a day. I asked him to add a long-acting Sinemet CR 50/200 mg strength once daily at bed time. I suggested he return for a sleep study. He had a diagnostic polysomnogram on 11/09/2013 and I went over his test results with him in detail today. Sleep efficiency was reduced at 58.7% with a latency to sleep of 82 minutes and wake after sleep onset of 117 minutes with mild to moderate sleep fragmentation noted. He had an increased percentage of light stage sleep, absence of deep sleep and 16.8% of dream sleep with a prolonged REM latency. He had mild to moderate snoring. Total AHI was 3.2 per hour, he had some lack of REM atonia.   I saw him on 05/31/2013, at which time I felt he was fairly stable. I kept him on Sinemet 4 times a day and gabapentin 100 mg 3 times a day. He reported, going up to 4 times a day with his Sinemet helped. He reported no side effects  with gabapentin or Sinemet and seemed to tolerate them well. He had some residual facial tingling which was tolerable to him. He has LBP and went to [facility].  He had an Xray of the back and was told there was degenerative disease and was given and was given a 3 week taper of oral steroids, which helped. He had stopped exercising d/t back pain and was scheduled for PT evaluation through ortho. He has been on metamucil for constipation which helped.   I saw him on 11/30/2012, at which time I suggested that he continue gabapentin 100 mg 1 pill 3 times a day for his paresthesias and encouraged him to continue Sinemet with 25/100 mg strength one tablet 4 times a day, at 7, 11, 3 PM and 7 PM.     I first met him on 06/22/2012 and he presented on 08/23/2012 for a sooner than scheduled appointment because he felt his medication was not helpful. He previously followed with Dr. Morene Antu and had been complaining of paresthesias. Dr. Erling Cruz had started him on low-dose gabapentin. For his parkinsonism which was first noted in June of 2012 he was tried on pramipexole which helped, but at his first visit with me he told me that his gabapentin was not helpful and he also discontinued pramipexole a few months prior because of swelling in his feet and ankles. This improved after stopping both medications. At the time of his first visit with me I felt he had mild parkinsonism without much in the way of lateralization. I started him on gabapentin again because of his paresthesias and also asked him to start low-dose Sinemet. He called back stating that the Sinemet was not helpful. He requested a sooner appointment. He had been continuing taking gabapentin 100 mg 3 times a day and Sinemet one pill 3 times a day with minimal improvement as he reported last time. I suggested at that visit that he increase his Sinemet to one pill 4 times a day and continue with gabapentin 100 mg 3 times a day. I also felt that he may have right  sided predominant idiopathic Parkinson's disease due to an intermittent tremor noted only on the right side.       His Past Medical History Is Significant For: Past Medical History:  Diagnosis Date  . Abnormal PFT 1. 05/18/08  2. 11/30/08   1. Showed mild airflow obstruction, mild restriction, mild diffusion defect; FEV1 2.22(64%), FVC 3.33(65%), FEVi% 67, TLC 5.19(69%), DLCO 77%, +BD  2. FEV1 2.38(73%), FVC 3.81(80%), FEV1% 63, TLC 5.61(80%), DLCO 79%, no BD  . Allergic rhinitis   . Anxiety   . BPH (benign prostatic hyperplasia)   . Cellulitis    right leg MRSA  . Erectile dysfunction   . GERD (gastroesophageal reflux disease)   . Heart murmur    mild MR by echo  . Hyperlipidemia   . Hypertension   . Osteoarthritis   . Paresthesias 06/22/2012  . Parkinsonism ([place]) 06/22/2012  . Permanent atrial fibrillation ([place])   . Restrictive lung disease     His Past Surgical History Is Significant For: Past Surgical History:  Procedure Laterality Date  . CARDIAC CATHETERIZATION  11/2009   normal coronary arteries  . CARDIOVERSION     multiple  . CATARACT EXTRACTION    . COLONOSCOPY    . COLONOSCOPY WITH PROPOFOL N/A 09/02/2016   Procedure: COLONOSCOPY WITH PROPOFOL;  Surgeon: Garlan Fair, MD;  Location: [facility];  Service: Endoscopy;  Laterality: N/A;  . EYE SURGERY     bilateral cataract extraction  . JOINT REPLACEMENT    . TOTAL KNEE ARTHROPLASTY Left 11/30/2014   Procedure: TOTAL KNEE ARTHROPLASTY;  Surgeon: Susa Day, MD;  Location: [facility];  Service: Orthopedics;  Laterality: Left;  . TOTAL KNEE ARTHROPLASTY Right 07/12/2015   Procedure: RIGHT TOTAL KNEE ARTHROPLASTY;  Surgeon: Susa Day, MD;  Location: [facility];  Service: Orthopedics;  Laterality: Right;  . TRICEPS TENDON REPAIR Right 05/20/2017  . TRICEPS TENDON REPAIR Right 05/20/2017   Procedure: TRICEPS TENDON REPAIR;  Surgeon: Iran Planas, MD;  Location: [place];  Service: Orthopedics;  Laterality: Right;    His  Family History Is Significant For: Family History  Problem Relation Age of Onset  . Diabetes Mother        DM  . Stroke Father        CVA  . Pancreatic cancer Brother   . Pancreatic cancer Other        Nephew    His Social History Is Significant For: Social History   Socioeconomic History  . Marital status: Married    Spouse name: [name]  . Number of children: 4  . Years of education: masters  . Highest education level: Not on file  Occupational History  . Occupation: Chief of Staff    Comment: Worked with [employer]  . Financial resource strain: Not on file  . Food insecurity:    Worry: Not on file    Inability: Not on file  . Transportation needs:    Medical: Not on file    Non-medical: Not on file  Tobacco Use  . Smoking status: Never Smoker  . Smokeless tobacco: Never Used  Substance and Sexual Activity  . Alcohol use: No    Alcohol/week: 0.0 oz  . Drug use: No  . Sexual activity: Not on file  Lifestyle  . Physical activity:    Days per week: Not on file    Minutes per session: Not on file  . Stress: Not on file  Relationships  . Social connections:    Talks on phone: Not on file    Gets together: Not on file    Attends religious service: Not on file    Active member of club or organization: Not on file    Attends meetings of clubs or organizations: Not on file    Relationship status: Not on file  Other Topics Concern  . Not on file  Social History Narrative   Married and lives in [place].  Retired Optometrist.   No reported caffeine use.     His Allergies Are:  Allergies  Allergen Reactions  . Lisinopril Cough  :   His Current Medications Are:  Outpatient Encounter Medications as of 08/18/2017  Medication Sig  . ammonium lactate (LAC-HYDRIN) 12 % cream Apply topically as needed for dry skin. As prescribed (Patient taking differently: Apply 1 g topically as needed for dry skin. As prescribed)  . carbidopa-levodopa (SINEMET  CR) 50-200 MG tablet Take 1 tablet by mouth at bedtime.  . carbidopa-levodopa (SINEMET IR) 25-100 MG tablet Take 2 tablets by mouth 4 (four) times daily. (Patient taking differently: Take 2 tablets by mouth 4 (four) times daily. 7AM, 11AM, 3PM, 7PM)  . cholecalciferol (VITAMIN D) 1000 units tablet Take 1,000 Units by mouth daily. 7 am  . clonazePAM (KLONOPIN) 0.5 MG disintegrating tablet Take 1 tablet (0.5 mg total) by mouth at bedtime.  Marland Kitchen doxazosin (CARDURA) 2 MG tablet Take 2 mg by mouth at bedtime.   Marland Kitchen escitalopram (LEXAPRO) 10 MG tablet Take 1 tablet (10 mg total) by mouth at bedtime.  . famotidine (PEPCID) 40 MG  tablet Take 40 mg by mouth every evening. 7 pm  . furosemide (LASIX) 20 MG tablet Take 20 mg by mouth 2 (two) times daily. 7 am, 7 pm  . gabapentin (NEURONTIN) 100 MG capsule Take 1 capsule (100 mg total) by mouth 3 (three) times daily. (Patient taking differently: Take 100 mg by mouth 3 (three) times daily. 7AM, 3PM, 7PM)  . losartan (COZAAR) 50 MG tablet Take 50 mg by mouth every morning.  . metoprolol succinate (TOPROL-XL) 25 MG 24 hr tablet Take 25 mg by mouth every evening. 7 pm  . Multiple Vitamin (MULTIVITAMIN WITH MINERALS) TABS tablet Take 1 tablet by mouth daily. 7 am  . potassium chloride (K-DUR,KLOR-CON) 10 MEQ tablet Take 10 mEq by mouth 2 (two) times daily.   . pravastatin (PRAVACHOL) 40 MG tablet Take 40 mg by mouth every morning.   . sodium chloride (OCEAN) 0.65 % SOLN nasal spray Place 1 spray into both nostrils daily as needed for congestion.  . tamsulosin (FLOMAX) 0.4 MG CAPS capsule Take 0.4 mg at bedtime by mouth.  . vitamin C (ASCORBIC ACID) 500 MG tablet Take 500 mg by mouth every morning.  . warfarin (COUMADIN) 5 MG tablet TAKE AS DIRECTED BY COUMADIN CLINIC (Patient taking differently: Take 7.5 mg by mouth daily on Wednesday and Saturday. Take 5 mg by mouth daily on all other days)   No facility-administered encounter medications on file as of 08/18/2017.    :  Review of Systems:  Out of a complete 14 point review of systems, all are reviewed and negative with the exception of these symptoms as listed below: Review of Systems  Neurological:       Pt presents today to follow up on his PD. Pt was recently discharged from rehab following a tricep repair. Pt's pharmacy has only been dispensing 30 days worth of sinemet, although it was ordered for a 90 day supply.    Objective:  Neurological Exam  Physical Exam Physical Examination:   Vitals:   08/18/17 0942  BP: 132/87  Pulse: 79    General Examination: The patient is a very pleasant 77 y.o. male in no acute distress. He appears well-developed and well-nourished and well groomed. Mildly deconditioned, less talkative.   HEENT:Normocephalic, atraumatic, pupils are equal, round and reactive to light and accommodation. s/p cataract repairs. Extraocular tracking shows moderate saccadic breakdown.Moderate to severe nuclear rigidity is noted. No lip, neck or jaw tremor is noted.He has mod hypophonia, no dysarthria noted. Oropharynx examination reveals no new findings, except moderate mouth dryness. Tongue is central, palate symmetrical.   Chest:Clear to auscultation without wheezing, rhonchi or crackles noted.  Heart:S1+S2+0, regular and normal without murmurs, rubs or gallops noted.   Abdomen:Soft, non-tender and non-distended with normal bowel sounds appreciated on auscultation.  Extremities:There is no edema in the distal lower extremities bilaterally.   Skin: Warm and dry without trophic changes noted.  Musculoskeletal: exam reveals no obvious joint deformities, tenderness or joint swelling or erythema, healing/healedscars from TKAs b/l. no obvious swelling, no pain, slight decrease in left arm elbow extension but otherwise better range of motion in the right arm. No pain.  Neurologically:  Mental status: The patient is awake, alert and oriented in all 4 spheres.  Hisimmediate and remote memory, attention, language skills and fund of knowledge are appropriate. There is no evidence of aphasia, agnosia, apraxia or anomia. Speech is clear with normal prosody and enunciation. Thought process is linear. Mood is normaland affect is normal.  Cranial nerves  II - XII are as described above under HEENT exam. Motor exam: Normal bulk, global strength of 4+/5, No resting tremor or rebound. Romberg is not tested for safety.Reflexes are not tested today. Fine motor exam shows severe impairment on the right, moderate impairment on the left. Limited movements R elbow.  Cerebellar testing: No dysmetria or intention tremor.  Sensory exam: intact to light touch in the upper and lower extremities.  Gait, station and balance: he stands up with difficulty, has to push himself up, requires no actual assistance. He walks slowly with his 2 wheeled walker, turns in about 5 steps, balance is impaired.  Assessment and Plan:   In summary, WALDEMAR SIEGEL a very pleasant 77 year old male with a history of atrial fibrillation, hypertension, hyperlipidemia, who presents for followup consultation of his right-sided predominant Parkinson's disease, most likely akinetic-rigid type, complicated by anxiety, mild depression, sleep disorder, including sleep fragmentation, nocturia, insomnia, obesity, arthritis, and RBD and recent status post left (2016), then right (2017)total knee replacement surgeries and recent falls, most recently With right elbow triceps tendon injury in February 2019 and status post surgery later in February 2019 and prolonged rehabilitation. As far as his Parkinson disease, he has had progression with time which is expected. He does not have any significant mood disorder but has benefited from low-dose Lexapro which we will continue. He recently came home after inpatient rehabilitation and has home health therapy and nursing at this point. He is advised to use his walker at  all times and his wife requested a u- step walker, which I prescribed as it is clear towards Parkinson's patients and he may be able to use it at this point. He is advised to continue with his current medication regimen which I renewed all 4 prescriptions for 90 days supply including Sinemet IR, Sinemet CR, Lexapro and clonazepam. I suggested a 4 month follow-up. We will check his MMSE next time as both patient and his wife have noted short-term memory issues with him. Medication wise, I suggested we continue with SinemetIR2 pills 4 times a day, andtheCR at night. We increased the lexapro generic to 10 mg in Dec. 2017. for his RBD he has been on low-dose clonazepam. I answered all their questions and patient and his wife were in agreement. I spent 25 minutes in total face-to-face time with the patient, more than 50% of which was spent in counseling and coordination of care, reviewing test results, reviewing medication and discussing or reviewing the diagnosis of PD, its prognosis and treatment options. Pertinent laboratory and imaging test results that were available during this visit with the patient were reviewed by me and considered in my medical decision making (see chart for details).

## 2017-08-19 ENCOUNTER — Ambulatory Visit (INDEPENDENT_AMBULATORY_CARE_PROVIDER_SITE_OTHER): Payer: Medicare Other | Admitting: Cardiovascular Disease

## 2017-08-19 DIAGNOSIS — Z5181 Encounter for therapeutic drug level monitoring: Secondary | ICD-10-CM

## 2017-08-19 DIAGNOSIS — G20 Parkinson's disease: Secondary | ICD-10-CM | POA: Diagnosis not present

## 2017-08-19 DIAGNOSIS — I4891 Unspecified atrial fibrillation: Secondary | ICD-10-CM | POA: Diagnosis not present

## 2017-08-19 DIAGNOSIS — S46391D Other injury of muscle, fascia and tendon of triceps, right arm, subsequent encounter: Secondary | ICD-10-CM | POA: Diagnosis not present

## 2017-08-19 LAB — POCT INR: INR: 3 (ref 2.0–3.0)

## 2017-08-19 NOTE — Telephone Encounter (Signed)
Dr. Rexene Alberts signed order. I faxed order to [facility]. Received a receipt of confirmation.

## 2017-08-20 DIAGNOSIS — G20 Parkinson's disease: Secondary | ICD-10-CM | POA: Diagnosis not present

## 2017-08-20 DIAGNOSIS — S46391D Other injury of muscle, fascia and tendon of triceps, right arm, subsequent encounter: Secondary | ICD-10-CM | POA: Diagnosis not present

## 2017-08-21 DIAGNOSIS — M66821 Spontaneous rupture of other tendons, right upper arm: Secondary | ICD-10-CM | POA: Diagnosis not present

## 2017-08-21 DIAGNOSIS — S46391D Other injury of muscle, fascia and tendon of triceps, right arm, subsequent encounter: Secondary | ICD-10-CM | POA: Diagnosis not present

## 2017-08-21 DIAGNOSIS — G20 Parkinson's disease: Secondary | ICD-10-CM | POA: Diagnosis not present

## 2017-08-24 DIAGNOSIS — G20 Parkinson's disease: Secondary | ICD-10-CM | POA: Diagnosis not present

## 2017-08-24 DIAGNOSIS — S46391D Other injury of muscle, fascia and tendon of triceps, right arm, subsequent encounter: Secondary | ICD-10-CM | POA: Diagnosis not present

## 2017-08-25 DIAGNOSIS — Z9889 Other specified postprocedural states: Secondary | ICD-10-CM | POA: Diagnosis not present

## 2017-08-25 DIAGNOSIS — N3281 Overactive bladder: Secondary | ICD-10-CM | POA: Diagnosis not present

## 2017-08-25 DIAGNOSIS — G20 Parkinson's disease: Secondary | ICD-10-CM | POA: Diagnosis not present

## 2017-08-26 ENCOUNTER — Ambulatory Visit (INDEPENDENT_AMBULATORY_CARE_PROVIDER_SITE_OTHER): Payer: Medicare Other | Admitting: Interventional Cardiology

## 2017-08-26 DIAGNOSIS — G20 Parkinson's disease: Secondary | ICD-10-CM | POA: Diagnosis not present

## 2017-08-26 DIAGNOSIS — S46391D Other injury of muscle, fascia and tendon of triceps, right arm, subsequent encounter: Secondary | ICD-10-CM | POA: Diagnosis not present

## 2017-08-26 DIAGNOSIS — Z5181 Encounter for therapeutic drug level monitoring: Secondary | ICD-10-CM | POA: Diagnosis not present

## 2017-08-26 DIAGNOSIS — I4891 Unspecified atrial fibrillation: Secondary | ICD-10-CM

## 2017-08-26 LAB — POCT INR: INR: 2.6 (ref 2.0–3.0)

## 2017-08-27 DIAGNOSIS — G20 Parkinson's disease: Secondary | ICD-10-CM | POA: Diagnosis not present

## 2017-08-27 DIAGNOSIS — S46391D Other injury of muscle, fascia and tendon of triceps, right arm, subsequent encounter: Secondary | ICD-10-CM | POA: Diagnosis not present

## 2017-08-28 DIAGNOSIS — G20 Parkinson's disease: Secondary | ICD-10-CM | POA: Diagnosis not present

## 2017-08-28 DIAGNOSIS — S46391D Other injury of muscle, fascia and tendon of triceps, right arm, subsequent encounter: Secondary | ICD-10-CM | POA: Diagnosis not present

## 2017-08-31 DIAGNOSIS — S46391D Other injury of muscle, fascia and tendon of triceps, right arm, subsequent encounter: Secondary | ICD-10-CM | POA: Diagnosis not present

## 2017-08-31 DIAGNOSIS — G20 Parkinson's disease: Secondary | ICD-10-CM | POA: Diagnosis not present

## 2017-09-01 DIAGNOSIS — S46391D Other injury of muscle, fascia and tendon of triceps, right arm, subsequent encounter: Secondary | ICD-10-CM | POA: Diagnosis not present

## 2017-09-01 DIAGNOSIS — G20 Parkinson's disease: Secondary | ICD-10-CM | POA: Diagnosis not present

## 2017-09-02 ENCOUNTER — Ambulatory Visit (INDEPENDENT_AMBULATORY_CARE_PROVIDER_SITE_OTHER): Payer: Medicare Other | Admitting: Internal Medicine

## 2017-09-02 DIAGNOSIS — Z5181 Encounter for therapeutic drug level monitoring: Secondary | ICD-10-CM

## 2017-09-02 DIAGNOSIS — G20 Parkinson's disease: Secondary | ICD-10-CM | POA: Diagnosis not present

## 2017-09-02 DIAGNOSIS — I4891 Unspecified atrial fibrillation: Secondary | ICD-10-CM | POA: Diagnosis not present

## 2017-09-02 DIAGNOSIS — S46391D Other injury of muscle, fascia and tendon of triceps, right arm, subsequent encounter: Secondary | ICD-10-CM | POA: Diagnosis not present

## 2017-09-02 LAB — POCT INR: INR: 3 (ref 2.0–3.0)

## 2017-09-07 DIAGNOSIS — S46391D Other injury of muscle, fascia and tendon of triceps, right arm, subsequent encounter: Secondary | ICD-10-CM | POA: Diagnosis not present

## 2017-09-07 DIAGNOSIS — G20 Parkinson's disease: Secondary | ICD-10-CM | POA: Diagnosis not present

## 2017-09-09 ENCOUNTER — Ambulatory Visit (INDEPENDENT_AMBULATORY_CARE_PROVIDER_SITE_OTHER): Payer: Medicare Other | Admitting: Internal Medicine

## 2017-09-09 DIAGNOSIS — S46391D Other injury of muscle, fascia and tendon of triceps, right arm, subsequent encounter: Secondary | ICD-10-CM | POA: Diagnosis not present

## 2017-09-09 DIAGNOSIS — Z5181 Encounter for therapeutic drug level monitoring: Secondary | ICD-10-CM | POA: Diagnosis not present

## 2017-09-09 DIAGNOSIS — I480 Paroxysmal atrial fibrillation: Secondary | ICD-10-CM

## 2017-09-09 DIAGNOSIS — G20 Parkinson's disease: Secondary | ICD-10-CM | POA: Diagnosis not present

## 2017-09-09 LAB — POCT INR: INR: 3.8 — AB (ref 2.0–3.0)

## 2017-09-09 NOTE — Patient Instructions (Signed)
Description   Spoke with Lorre Munroe RN & instructed pt to skip his Coumadin today, then continue taking 1 tablet daily except 1.5 tablets each Wednesday and Saturday. Continue eating dark green leafy veggies three times a week. Recheck INR in 1 week with [facility] RN. Call with any questions or concerns Coumadin Clinic [phone]

## 2017-09-14 DIAGNOSIS — G20 Parkinson's disease: Secondary | ICD-10-CM | POA: Diagnosis not present

## 2017-09-14 DIAGNOSIS — S46391D Other injury of muscle, fascia and tendon of triceps, right arm, subsequent encounter: Secondary | ICD-10-CM | POA: Diagnosis not present

## 2017-09-16 DIAGNOSIS — G20 Parkinson's disease: Secondary | ICD-10-CM | POA: Diagnosis not present

## 2017-09-16 DIAGNOSIS — S46391D Other injury of muscle, fascia and tendon of triceps, right arm, subsequent encounter: Secondary | ICD-10-CM | POA: Diagnosis not present

## 2017-09-23 ENCOUNTER — Ambulatory Visit (INDEPENDENT_AMBULATORY_CARE_PROVIDER_SITE_OTHER): Payer: Medicare Other | Admitting: *Deleted

## 2017-09-23 DIAGNOSIS — I4891 Unspecified atrial fibrillation: Secondary | ICD-10-CM | POA: Diagnosis not present

## 2017-09-23 DIAGNOSIS — S46391D Other injury of muscle, fascia and tendon of triceps, right arm, subsequent encounter: Secondary | ICD-10-CM | POA: Diagnosis not present

## 2017-09-23 DIAGNOSIS — G20 Parkinson's disease: Secondary | ICD-10-CM | POA: Diagnosis not present

## 2017-09-23 DIAGNOSIS — I480 Paroxysmal atrial fibrillation: Secondary | ICD-10-CM

## 2017-09-23 DIAGNOSIS — Z5181 Encounter for therapeutic drug level monitoring: Secondary | ICD-10-CM

## 2017-09-23 LAB — POCT INR: INR: 2.8 (ref 2.0–3.0)

## 2017-09-23 NOTE — Patient Instructions (Signed)
Description   Continue taking 1 tablet daily except 1.5 tablets each Wednesday and Saturday. Continue eating dark green leafy veggies three times a week. Recheck INR in 3 weeks. Call with any questions or concerns Coumadin Clinic [phone]

## 2017-09-30 DIAGNOSIS — G20 Parkinson's disease: Secondary | ICD-10-CM | POA: Diagnosis not present

## 2017-09-30 DIAGNOSIS — S46391D Other injury of muscle, fascia and tendon of triceps, right arm, subsequent encounter: Secondary | ICD-10-CM | POA: Diagnosis not present

## 2017-10-01 DIAGNOSIS — S46391D Other injury of muscle, fascia and tendon of triceps, right arm, subsequent encounter: Secondary | ICD-10-CM | POA: Diagnosis not present

## 2017-10-01 DIAGNOSIS — G20 Parkinson's disease: Secondary | ICD-10-CM | POA: Diagnosis not present

## 2017-10-05 DIAGNOSIS — S46391D Other injury of muscle, fascia and tendon of triceps, right arm, subsequent encounter: Secondary | ICD-10-CM | POA: Diagnosis not present

## 2017-10-05 DIAGNOSIS — G20 Parkinson's disease: Secondary | ICD-10-CM | POA: Diagnosis not present

## 2017-10-08 DIAGNOSIS — Z961 Presence of intraocular lens: Secondary | ICD-10-CM | POA: Diagnosis not present

## 2017-10-12 DIAGNOSIS — N182 Chronic kidney disease, stage 2 (mild): Secondary | ICD-10-CM | POA: Diagnosis not present

## 2017-10-12 DIAGNOSIS — I10 Essential (primary) hypertension: Secondary | ICD-10-CM | POA: Diagnosis not present

## 2017-10-12 DIAGNOSIS — G20 Parkinson's disease: Secondary | ICD-10-CM | POA: Diagnosis not present

## 2017-10-12 DIAGNOSIS — R479 Unspecified speech disturbances: Secondary | ICD-10-CM | POA: Diagnosis not present

## 2017-10-12 DIAGNOSIS — J309 Allergic rhinitis, unspecified: Secondary | ICD-10-CM | POA: Diagnosis not present

## 2017-10-12 DIAGNOSIS — D696 Thrombocytopenia, unspecified: Secondary | ICD-10-CM | POA: Diagnosis not present

## 2017-10-12 DIAGNOSIS — F419 Anxiety disorder, unspecified: Secondary | ICD-10-CM | POA: Diagnosis not present

## 2017-10-12 DIAGNOSIS — N400 Enlarged prostate without lower urinary tract symptoms: Secondary | ICD-10-CM | POA: Diagnosis not present

## 2017-10-12 DIAGNOSIS — S46391D Other injury of muscle, fascia and tendon of triceps, right arm, subsequent encounter: Secondary | ICD-10-CM | POA: Diagnosis not present

## 2017-10-12 DIAGNOSIS — E782 Mixed hyperlipidemia: Secondary | ICD-10-CM | POA: Diagnosis not present

## 2017-10-12 DIAGNOSIS — I482 Chronic atrial fibrillation: Secondary | ICD-10-CM | POA: Diagnosis not present

## 2017-10-13 DIAGNOSIS — G20 Parkinson's disease: Secondary | ICD-10-CM | POA: Diagnosis not present

## 2017-10-13 DIAGNOSIS — S46391D Other injury of muscle, fascia and tendon of triceps, right arm, subsequent encounter: Secondary | ICD-10-CM | POA: Diagnosis not present

## 2017-10-15 ENCOUNTER — Ambulatory Visit (INDEPENDENT_AMBULATORY_CARE_PROVIDER_SITE_OTHER): Payer: Medicare Other | Admitting: Pharmacist

## 2017-10-15 DIAGNOSIS — N182 Chronic kidney disease, stage 2 (mild): Secondary | ICD-10-CM | POA: Diagnosis not present

## 2017-10-15 DIAGNOSIS — Z5181 Encounter for therapeutic drug level monitoring: Secondary | ICD-10-CM | POA: Diagnosis not present

## 2017-10-15 DIAGNOSIS — N529 Male erectile dysfunction, unspecified: Secondary | ICD-10-CM | POA: Diagnosis not present

## 2017-10-15 DIAGNOSIS — I129 Hypertensive chronic kidney disease with stage 1 through stage 4 chronic kidney disease, or unspecified chronic kidney disease: Secondary | ICD-10-CM | POA: Diagnosis not present

## 2017-10-15 DIAGNOSIS — S46391D Other injury of muscle, fascia and tendon of triceps, right arm, subsequent encounter: Secondary | ICD-10-CM | POA: Diagnosis not present

## 2017-10-15 DIAGNOSIS — I4891 Unspecified atrial fibrillation: Secondary | ICD-10-CM

## 2017-10-15 DIAGNOSIS — R918 Other nonspecific abnormal finding of lung field: Secondary | ICD-10-CM | POA: Diagnosis not present

## 2017-10-15 DIAGNOSIS — K219 Gastro-esophageal reflux disease without esophagitis: Secondary | ICD-10-CM | POA: Diagnosis not present

## 2017-10-15 DIAGNOSIS — N401 Enlarged prostate with lower urinary tract symptoms: Secondary | ICD-10-CM | POA: Diagnosis not present

## 2017-10-15 DIAGNOSIS — R339 Retention of urine, unspecified: Secondary | ICD-10-CM | POA: Diagnosis not present

## 2017-10-15 DIAGNOSIS — G20 Parkinson's disease: Secondary | ICD-10-CM | POA: Diagnosis not present

## 2017-10-15 DIAGNOSIS — I482 Chronic atrial fibrillation: Secondary | ICD-10-CM | POA: Diagnosis not present

## 2017-10-15 DIAGNOSIS — J984 Other disorders of lung: Secondary | ICD-10-CM | POA: Diagnosis not present

## 2017-10-15 DIAGNOSIS — E785 Hyperlipidemia, unspecified: Secondary | ICD-10-CM | POA: Diagnosis not present

## 2017-10-15 LAB — POCT INR: INR: 2.8 (ref 2.0–3.0)

## 2017-10-15 NOTE — Patient Instructions (Signed)
Description   Continue taking 1 tablet daily except 1.5 tablets each Wednesday and Saturday. Continue eating dark green leafy veggies three times a week. Recheck INR in 5 weeks. Call with any questions or concerns Coumadin Clinic [phone]

## 2017-10-22 DIAGNOSIS — S46391D Other injury of muscle, fascia and tendon of triceps, right arm, subsequent encounter: Secondary | ICD-10-CM | POA: Diagnosis not present

## 2017-10-22 DIAGNOSIS — G20 Parkinson's disease: Secondary | ICD-10-CM | POA: Diagnosis not present

## 2017-10-28 DIAGNOSIS — G20 Parkinson's disease: Secondary | ICD-10-CM | POA: Diagnosis not present

## 2017-10-28 DIAGNOSIS — S46391D Other injury of muscle, fascia and tendon of triceps, right arm, subsequent encounter: Secondary | ICD-10-CM | POA: Diagnosis not present

## 2017-11-04 DIAGNOSIS — S46391D Other injury of muscle, fascia and tendon of triceps, right arm, subsequent encounter: Secondary | ICD-10-CM | POA: Diagnosis not present

## 2017-11-04 DIAGNOSIS — G20 Parkinson's disease: Secondary | ICD-10-CM | POA: Diagnosis not present

## 2017-11-09 DIAGNOSIS — G20 Parkinson's disease: Secondary | ICD-10-CM | POA: Diagnosis not present

## 2017-11-09 DIAGNOSIS — S46391D Other injury of muscle, fascia and tendon of triceps, right arm, subsequent encounter: Secondary | ICD-10-CM | POA: Diagnosis not present

## 2017-11-10 ENCOUNTER — Ambulatory Visit: Payer: Medicare Other

## 2017-11-11 ENCOUNTER — Ambulatory Visit: Payer: Medicare Other | Attending: Internal Medicine

## 2017-11-11 ENCOUNTER — Telehealth: Payer: Self-pay | Admitting: *Deleted

## 2017-11-11 DIAGNOSIS — R471 Dysarthria and anarthria: Secondary | ICD-10-CM | POA: Insufficient documentation

## 2017-11-11 DIAGNOSIS — G20 Parkinson's disease: Secondary | ICD-10-CM

## 2017-11-11 NOTE — Telephone Encounter (Signed)
Pt wife(on DPR-Zaucha,Barbara @[phone]) has called for RN Cyril Mourning to discuss the 3 therapies needed for pt: OT, PT and Speech.  Wife states pt will finish home therapy with [facility] on next week.  Please call.

## 2017-11-11 NOTE — Telephone Encounter (Signed)
I called pt's wife, [name], per DPR. She is requesting a PT/OT/SLP referral to neuro rehab for pt's PD.  I spoke with Dr. Rexene Alberts and she gave me a VO for this order. Order placed. Pt's wife verbalized understanding.

## 2017-11-11 NOTE — Telephone Encounter (Signed)
Pt wife, need all patient rehab order sent to one rehab facility. Please send all  orders to [facility].  Please call [phone]

## 2017-11-11 NOTE — Addendum Note (Signed)
Addended by: Lester Kensett A on: 11/11/2017 01:08 PM   Modules accepted: Orders

## 2017-11-11 NOTE — Telephone Encounter (Signed)
It does not appear that Dr. Rexene Alberts has ordered any PT/OT for this pt. Pt should contact the ordering provider for changing rehab facilities.  I called pt's wife to discuss. No answer, left a message asking her to call me back.

## 2017-11-11 NOTE — Therapy (Signed)
[facility] [address] [place], [place], [address] Phone: [phone]   Fax:  [phone]  Patient Details  Name: Brian [name] MRN: [id number] Date of Birth: 07-01-1940 Referring Provider:  Wenda Low, MD  Encounter Date: 11/11/2017   Arrived-No charge  Within first 10 minutes of eval it was learned the pt is receiving home health PT currently with [facility], following his fall in the bathroom, and subsequent rehab at [facility].  SLP recommended to wait on ST eval until home health PT d/c'd pt. Wife and pt understand and agree with this plan.  SLP strongly recommends pt receive outpatient PT and OT evals for Parkinson's disease.  [facility] ,[place], [place]  11/11/2017, 10:05 AM  [facility] [address] [place], [place], [address] Phone: [phone]   Fax:  [phone]

## 2017-11-17 DIAGNOSIS — S46391D Other injury of muscle, fascia and tendon of triceps, right arm, subsequent encounter: Secondary | ICD-10-CM | POA: Diagnosis not present

## 2017-11-17 DIAGNOSIS — G20 Parkinson's disease: Secondary | ICD-10-CM | POA: Diagnosis not present

## 2017-11-19 ENCOUNTER — Ambulatory Visit (INDEPENDENT_AMBULATORY_CARE_PROVIDER_SITE_OTHER): Payer: Medicare Other

## 2017-11-19 DIAGNOSIS — N401 Enlarged prostate with lower urinary tract symptoms: Secondary | ICD-10-CM | POA: Diagnosis not present

## 2017-11-19 DIAGNOSIS — I4891 Unspecified atrial fibrillation: Secondary | ICD-10-CM

## 2017-11-19 DIAGNOSIS — Z5181 Encounter for therapeutic drug level monitoring: Secondary | ICD-10-CM

## 2017-11-19 DIAGNOSIS — R350 Frequency of micturition: Secondary | ICD-10-CM | POA: Diagnosis not present

## 2017-11-19 LAB — POCT INR: INR: 3.2 — AB (ref 2.0–3.0)

## 2017-11-19 NOTE — Patient Instructions (Signed)
Description   Skip today's dosage of Coumadin, then resume same dosage 1 tablet daily except 1.5 tablets each Wednesdays and Saturdays. Continue eating dark green leafy veggies three times a week. Recheck INR in 4 weeks. Call with any questions or concerns [facility] [phone]

## 2017-11-20 ENCOUNTER — Other Ambulatory Visit: Payer: Self-pay | Admitting: Cardiology

## 2017-12-17 ENCOUNTER — Ambulatory Visit (INDEPENDENT_AMBULATORY_CARE_PROVIDER_SITE_OTHER): Payer: Medicare Other | Admitting: *Deleted

## 2017-12-17 DIAGNOSIS — I4891 Unspecified atrial fibrillation: Secondary | ICD-10-CM | POA: Diagnosis not present

## 2017-12-17 DIAGNOSIS — Z5181 Encounter for therapeutic drug level monitoring: Secondary | ICD-10-CM | POA: Diagnosis not present

## 2017-12-17 LAB — POCT INR: INR: 3.9 — AB (ref 2.0–3.0)

## 2017-12-17 NOTE — Patient Instructions (Signed)
Description   Skip today's dosage of Coumadin, then Change your dosage to 1 tablet daily except 1.5 tablets each Wednesdays. Continue eating dark green leafy veggies three times a week. Recheck INR in 2 weeks. Call with any questions or concerns Coumadin Clinic [phone]

## 2017-12-21 ENCOUNTER — Encounter: Payer: Self-pay | Admitting: Neurology

## 2017-12-21 ENCOUNTER — Ambulatory Visit (INDEPENDENT_AMBULATORY_CARE_PROVIDER_SITE_OTHER): Payer: Medicare Other | Admitting: Neurology

## 2017-12-21 VITALS — BP 131/80 | HR 79 | Ht 74.0 in | Wt 226.0 lb

## 2017-12-21 DIAGNOSIS — R413 Other amnesia: Secondary | ICD-10-CM

## 2017-12-21 DIAGNOSIS — Z96651 Presence of right artificial knee joint: Secondary | ICD-10-CM | POA: Diagnosis not present

## 2017-12-21 DIAGNOSIS — G4752 REM sleep behavior disorder: Secondary | ICD-10-CM

## 2017-12-21 DIAGNOSIS — Z9181 History of falling: Secondary | ICD-10-CM | POA: Diagnosis not present

## 2017-12-21 DIAGNOSIS — F419 Anxiety disorder, unspecified: Secondary | ICD-10-CM | POA: Diagnosis not present

## 2017-12-21 DIAGNOSIS — Z96652 Presence of left artificial knee joint: Secondary | ICD-10-CM | POA: Diagnosis not present

## 2017-12-21 DIAGNOSIS — G20 Parkinson's disease: Secondary | ICD-10-CM

## 2017-12-21 MED ORDER — GABAPENTIN 100 MG PO CAPS: 100.0000 mg | ORAL_CAPSULE | Freq: Three times a day (TID) | ORAL | 3 refills | Status: DC

## 2017-12-21 MED ORDER — RIVASTIGMINE TARTRATE 1.5 MG PO CAPS: 1.5000 mg | ORAL_CAPSULE | Freq: Two times a day (BID) | ORAL | 5 refills | Status: DC

## 2017-12-21 NOTE — Patient Instructions (Addendum)
We will start for your memory: Exelon 1.5 mg strength: 1 pill 2 times a day. Side effects may include: dry eyes, dry mouth, confusion, low pulse, low blood pressure, also GI related side effects (nausea, vomiting, diarrhea, constipation), headaches; rare side effects may include hallucinations and seizures.   I will request neuro rehab to evaluate you with PT, OT, and ST and start therapy hopefully soon, outpatient.   We will keep your meds the same, use your walker at all times.

## 2017-12-21 NOTE — Progress Notes (Signed)
Subjective:    Patient ID: Brian [name] is a 77 y.o. male.  HPI     Interim history:   Brian [name] is a 77 year old right-handed gentleman with an underlying medical history of atrial fibrillation, hypertension, OSA, and hyperlipidemia who presents for followup consultation of his right-sided predominant Parkinson's disease complicated by bladder hyperactivity, anxiety, mobility limitations secondary to arthritis, and RBD. He is accompanied by his wife again today. I last saw him on 08/10/2013, at which time he was having physical therapy and occupational therapy at home. He had recently been discharged from inpatient rehabilitation at [facility]. He had triceps tendon repair on the right side in February 2019. I suggested we keep his prescriptions as same including Sinemet, Sinemet CR, Lexapro and clonazepam. I prescribed a U step walker.   Today, 12/21/2017 (all dictated new, as well as above notes, some dictation done in note pad or Word, outside of chart, may appear as copied):    He reports having had some falls, right leg gives out. Takes small stutter steps according to wife. He denies any pain in his knees. He has not had any recent therapy. He has not heard from neuro rehabilitation about his evaluations. We placed an order for therapy in August. Wife has noticed forgetfulness, sometimes he forgets his 11:00 dose. He has had some visual hallucinations. These are not bothersome and not frequent. Appetite is good. Takes longer for all ADLs.    The patient's allergies, current medications, family history, past medical history, past social history, past surgical history and problem list were reviewed and updated as appropriate.    Previously (copied from previous notes for reference):    I saw him on 05/18/2017, at which time he reported a recent fall with injury to right elbow. He also bumped his head. He fell in the bathroom. He went to the emergency room and I reviewed records at the  time. He had a head CT without contrast which showed no acute process, moderate volume loss, moderate chronic small vessel disease. He needed surgery to his right arm under orthopedics.   I saw him on 01/12/2017, at which time he reported to recent falls. He was trying to work out on his own and was supposed to start working with a Physiological scientist.     I saw him on 07/09/2016, at which time he was fairly stable.     I saw him on 03/06/2016 at which time he reported doing better, he had some residual right knee pain. He had an appointment with his orthopedic surgeon. He reported one fall that led to an ER visit via EMS. He also had an admission to the hospital in October 2017 secondary to nausea, vomiting, urinary retention. He was noted to have altered mental status and acute kidney injury. He had a head CT without contrast on 12/31/2015 which showed:    1.  No acute intracranial abnormality. 2. Mild increase in generalized atrophy from 2013. Chronic small vessel ischemia is stable.   He was in the hospital for 4 days, then at [facility] in rehabilitation for about 10 days. He has ongoing issues with particularly right leg stiffness, no additional falls, does not appear to be very motivated to exercise. He had been in the PD boxing class. I suggested we continue with Sinemet 2 pills 4 times a day, Sinemet CR at bedtime, gabapentin, clonazepam for RBD at night and the only change was an increase in Lexapro from 5 mg to 10 mg  daily.   I saw him on 10/22/2015, at which time he reported worse mobility, more stiffness in the right leg, more freezing episodes, difficulty getting out of bed especially at night when he would have to go to the bathroom. His wife would have to help him. Thankfully, he had no recent falls and was using a cane. He had finished physical therapy was not using his walker. He was not exercising very much to. He was more sedentary. He had right total knee replacement on 07/12/2015.  I suggested we increase his Sinemet to 2 pills 4 times a day. We kept his gabapentin the same. We kept the Sinemet CR at bedtime the same.   I saw him on 04/26/2015, at which time he reported taking carbidopa/levodopa 25-100 milligrams strength, about 7 pills per day, 2 for the 1st dose, 1 1/2 pills for the 2nd and 4 th dose, 2 for the 3rd dose, and 1 CR at night. He finished physical therapy and was able to walk without a cane. He reported right knee pain and indicated that he may need right knee surgery as well.   I saw him on 01/18/2015, at which time he reported doing better slowly. He had finished home health physical therapy. He was supposed to start outpatient physical therapy through orthopedics. He had left total knee arthroplasty on 11/30/2014. I reviewed the operative note as well as his discharge summary. He was discharged on 12/03/2014 to inpatient rehabilitation. Of note, he presented to the emergency room on 12/16/2014 secondary to repeated vomiting. This was deemed secondary to constipation. Cardiac enzymes were tested which were negative for any acute coronary syndrome. I reviewed the emergency room records. He had blood work which I reviewed. INR was 2.59. Hemoglobin was slightly low at 11.9 and hematocrit was 34.6. Troponin was negative 2. He overall felt a little better since we increased his Sinemet. He was taking a long-acting Sinemet CR at night. He had some off time in the middle of the night.  He felt he was making slow progress after his knee replacement surgery. He had some issues with edema. He was wearing compression stockings for this. He was also placed on furosemide for a little while. He was no longer on narcotic pain medications. He was using a cane at the time. I suggested we continue with his medication regimen, he was alternating Sinemet 2 pills with 1-1/2 pills for a total of 4 doses a day.   He missed an appointment on 12/19/2014. I saw him on 08/22/2014, at which time  he reported bilateral knee pain. He had seen Dr. Tonita Cong for his knee arthritis and had undergone injections into both knees. He did not think the injections helped a lot. He was exploring knee replacement surgery. He was having more difficulty getting out of chairs. He had no freezing. He did think that the increase in Sinemet had helped in the past. He was alternating 1 pill with 1-1/2 pills. He was taking Sinemet CR at night. He was on gabapentin 100 mg 3 times a day and Lexapro 5 mg daily as well as clonazepam 0.5 mg each night. He had no new memory or mood issues. He was overall moving slower. He had no recent falls.   In the interim, his wife called on 01/04/2015 reported that he had worsening symptoms. I suggested we increase his Sinemet slightly to 2 tablets alternating with 1-1/2 tablets.   I saw him on 06/01/2014, at which time he reported overall doing  well. He was not able to sleep through the night. He was consistently waking up between 3 and 4 AM. He had nocturia twice on average. He felt improved from the bladder medication but still reported disrupted sleep. He felt clonazepam has helped. His wife agreed. He also felt that the increase in Sinemet was helpful in his motor function. It was difficult for him to keep up with the 5 pills a day schedule. He was going to the [facility] 3 times a week for about 30 minutes. Lexapro has helped his mood. His wife felt that he was less anxious. I suggested we increase his clonazepam to 0.5 mg each night. I suggested we change his Sinemet to 1 pill alternating with one half pills for a total of 5 pills daily but for doses. I suggested he continue with Sinemet CR at bedtime.   I saw him on 03/27/2014, at which time he reported sleeping a little bit better but his tremor was worse. He did not feel Sinemet was lasting him 4 hours in between 2 different doses. He was going to the gym twice a week. He was back on an antidepressant. He was supposed to start a new bladder  medication. He never actually started clonazepam for RBD. I prescribed clonazepam for him for RBD. I increased his Sinemet to one fill 5 times a day.   I saw him on 12/01/2013, at which time he reported, that he stopped Myrbetric due to new onset facial swelling, but this was no better after stopping it. His facial swelling may have started after the lexapro. Adding long-acting Sinemet helped him sleep a little bit longer up to 3 AM. I suggested he stop Lexapro because of a possible allergic reaction to it. I also added low-dose clonazepam for his RBD and for his sleep.   I saw him on 09/29/2013 at which time he presented for a sooner than scheduled appointment because of increasing tremors and parkinsonian symptoms. He also felt more anxious but not frankly depressed. His wife felt that he may have been depressed. She reported dream enactments. She also reported loud snoring. He had a sleep study over 5 years ago, and that was negative for OSA at the time. He had started Myrbetriq recently. He has been taking gabapentin 3 times a day, and 7, 11 and 3. He takes Sinemet regularly at 7, 11, 3, and 7. I suggested that he start low-dose Lexapro at 5 mg strength. I changed the timing of his gabapentin to one pill at 7 AM, 1 pill at 11 AM and one pill at 7 PM. I asked him to continue with Sinemet 4 times a day. I asked him to add a long-acting Sinemet CR 50/200 mg strength once daily at bed time. I suggested he return for a sleep study. He had a diagnostic polysomnogram on 11/09/2013 and I went over his test results with him in detail today. Sleep efficiency was reduced at 58.7% with a latency to sleep of 82 minutes and wake after sleep onset of 117 minutes with mild to moderate sleep fragmentation noted. He had an increased percentage of light stage sleep, absence of deep sleep and 16.8% of dream sleep with a prolonged REM latency. He had mild to moderate snoring. Total AHI was 3.2 per hour, he had some lack of REM  atonia.   I saw him on 05/31/2013, at which time I felt he was fairly stable. I kept him on Sinemet 4 times a day and gabapentin 100  mg 3 times a day. He reported, going up to 4 times a day with his Sinemet helped. He reported no side effects with gabapentin or Sinemet and seemed to tolerate them well. He had some residual facial tingling which was tolerable to him. He has LBP and went to [facility]. He had an Xray of the back and was told there was degenerative disease and was given and was given a 3 week taper of oral steroids, which helped. He had stopped exercising d/t back pain and was scheduled for PT evaluation through ortho. He has been on metamucil for constipation which helped.   I saw him on 11/30/2012, at which time I suggested that he continue gabapentin 100 mg 1 pill 3 times a day for his paresthesias and encouraged him to continue Sinemet with 25/100 mg strength one tablet 4 times a day, at 7, 11, 3 PM and 7 PM.     I first met him on 06/22/2012 and he presented on 08/23/2012 for a sooner than scheduled appointment because he felt his medication was not helpful. He previously followed with Dr. Morene Antu and had been complaining of paresthesias. Dr. Erling Cruz had started him on low-dose gabapentin. For his parkinsonism which was first noted in June of 2012 he was tried on pramipexole which helped, but at his first visit with me he told me that his gabapentin was not helpful and he also discontinued pramipexole a few months prior because of swelling in his feet and ankles. This improved after stopping both medications. At the time of his first visit with me I felt he had mild parkinsonism without much in the way of lateralization. I started him on gabapentin again because of his paresthesias and also asked him to start low-dose Sinemet. He called back stating that the Sinemet was not helpful. He requested a sooner appointment. He had been continuing taking gabapentin 100 mg 3 times a day and Sinemet  one pill 3 times a day with minimal improvement as he reported last time. I suggested at that visit that he increase his Sinemet to one pill 4 times a day and continue with gabapentin 100 mg 3 times a day. I also felt that he may have right sided predominant idiopathic Parkinson's disease due to an intermittent tremor noted only on the right side.     His Past Medical History Is Significant For: Past Medical History:  Diagnosis Date  . Abnormal PFT 1. 05/18/08  2. 11/30/08   1. Showed mild airflow obstruction, mild restriction, mild diffusion defect; FEV1 2.22(64%), FVC 3.33(65%), FEVi% 67, TLC 5.19(69%), DLCO 77%, +BD  2. FEV1 2.38(73%), FVC 3.81(80%), FEV1% 63, TLC 5.61(80%), DLCO 79%, no BD  . Allergic rhinitis   . Anxiety   . BPH (benign prostatic hyperplasia)   . Cellulitis    right leg MRSA  . Erectile dysfunction   . GERD (gastroesophageal reflux disease)   . Heart murmur    mild MR by echo  . Hyperlipidemia   . Hypertension   . Osteoarthritis   . Paresthesias 06/22/2012  . Parkinsonism ([place]) 06/22/2012  . Permanent atrial fibrillation ([place])   . Restrictive lung disease     His Past Surgical History Is Significant For: Past Surgical History:  Procedure Laterality Date  . CARDIAC CATHETERIZATION  11/2009   normal coronary arteries  . CARDIOVERSION     multiple  . CATARACT EXTRACTION    . COLONOSCOPY    . COLONOSCOPY WITH PROPOFOL N/A 09/02/2016   Procedure:  COLONOSCOPY WITH PROPOFOL;  Surgeon: Garlan Fair, MD;  Location: [facility];  Service: Endoscopy;  Laterality: N/A;  . EYE SURGERY     bilateral cataract extraction  . JOINT REPLACEMENT    . TOTAL KNEE ARTHROPLASTY Left 11/30/2014   Procedure: TOTAL KNEE ARTHROPLASTY;  Surgeon: Susa Day, MD;  Location: [facility];  Service: Orthopedics;  Laterality: Left;  . TOTAL KNEE ARTHROPLASTY Right 07/12/2015   Procedure: RIGHT TOTAL KNEE ARTHROPLASTY;  Surgeon: Susa Day, MD;  Location: [facility];  Service: Orthopedics;   Laterality: Right;  . TRICEPS TENDON REPAIR Right 05/20/2017  . TRICEPS TENDON REPAIR Right 05/20/2017   Procedure: TRICEPS TENDON REPAIR;  Surgeon: Iran Planas, MD;  Location: [facility];  Service: Orthopedics;  Laterality: Right;    His Family History Is Significant For: Family History  Problem Relation Age of Onset  . Diabetes Mother        DM  . Stroke Father        CVA  . Pancreatic cancer Brother   . Pancreatic cancer Other        Nephew    His Social History Is Significant For: Social History   Socioeconomic History  . Marital status: Married    Spouse name: [name]  . Number of children: 4  . Years of education: masters  . Highest education level: Not on file  Occupational History  . Occupation: Chief of Staff    Comment: Worked with [employer]  . Financial resource strain: Not on file  . Food insecurity:    Worry: Not on file    Inability: Not on file  . Transportation needs:    Medical: Not on file    Non-medical: Not on file  Tobacco Use  . Smoking status: Never Smoker  . Smokeless tobacco: Never Used  Substance and Sexual Activity  . Alcohol use: No    Alcohol/week: 0.0 standard drinks  . Drug use: No  . Sexual activity: Not on file  Lifestyle  . Physical activity:    Days per week: Not on file    Minutes per session: Not on file  . Stress: Not on file  Relationships  . Social connections:    Talks on phone: Not on file    Gets together: Not on file    Attends religious service: Not on file    Active member of club or organization: Not on file    Attends meetings of clubs or organizations: Not on file    Relationship status: Not on file  Other Topics Concern  . Not on file  Social History Narrative   Married and lives in [place].  Retired Optometrist.   No reported caffeine use.     His Allergies Are:  Allergies  Allergen Reactions  . Lisinopril Cough  :   His Current Medications Are:  Outpatient Encounter Medications  as of 12/21/2017  Medication Sig  . ammonium lactate (LAC-HYDRIN) 12 % cream Apply topically as needed for dry skin. As prescribed (Patient taking differently: Apply 1 g topically as needed for dry skin. As prescribed)  . carbidopa-levodopa (SINEMET CR) 50-200 MG tablet Take 1 tablet by mouth at bedtime.  . carbidopa-levodopa (SINEMET IR) 25-100 MG tablet Take 2 tablets by mouth 4 (four) times daily.  . cholecalciferol (VITAMIN D) 1000 units tablet Take 1,000 Units by mouth daily. 7 am  . clonazePAM (KLONOPIN) 0.5 MG disintegrating tablet Take 1 tablet (0.5 mg total) by mouth at bedtime.  Marland Kitchen doxazosin (  CARDURA) 2 MG tablet Take 2 mg by mouth at bedtime.   Marland Kitchen escitalopram (LEXAPRO) 10 MG tablet Take 1 tablet (10 mg total) by mouth at bedtime.  . famotidine (PEPCID) 40 MG tablet Take 40 mg by mouth every evening. 7 pm  . furosemide (LASIX) 20 MG tablet Take 20 mg by mouth 2 (two) times daily. 7 am, 7 pm  . gabapentin (NEURONTIN) 100 MG capsule Take 1 capsule (100 mg total) by mouth 3 (three) times daily. (Patient taking differently: Take 100 mg by mouth 3 (three) times daily. 7AM, 3PM, 7PM)  . losartan (COZAAR) 50 MG tablet Take 50 mg by mouth every morning.  . metoprolol succinate (TOPROL-XL) 25 MG 24 hr tablet Take 25 mg by mouth every evening. 7 pm  . Multiple Vitamin (MULTIVITAMIN WITH MINERALS) TABS tablet Take 1 tablet by mouth daily. 7 am  . potassium chloride (K-DUR,KLOR-CON) 10 MEQ tablet Take 10 mEq by mouth 2 (two) times daily.   . pravastatin (PRAVACHOL) 40 MG tablet Take 40 mg by mouth every morning.   . sodium chloride (OCEAN) 0.65 % SOLN nasal spray Place 1 spray into both nostrils daily as needed for congestion.  . tamsulosin (FLOMAX) 0.4 MG CAPS capsule Take 0.4 mg at bedtime by mouth.  . vitamin C (ASCORBIC ACID) 500 MG tablet Take 500 mg by mouth every morning.  . warfarin (COUMADIN) 5 MG tablet TAKE AS DIRECTED BY MOUTH BY COUMADIN CLINIC   No facility-administered encounter  medications on file as of 12/21/2017.   :  Review of Systems:  Out of a complete 14 point review of systems, all are reviewed and negative with the exception of these symptoms as listed below: Review of Systems  Neurological:       Pt presents today to discuss his PD. Pt reports that his right leg is giving him problems. Pt has sustained a couple of falls.    Objective:  Neurological Exam  Physical Exam Physical Examination:   Vitals:   12/21/17 1008  BP: 131/80  Pulse: 79   General Examination: The patient is a very pleasant 77 y.o. male in no acute distress. He appears well-developed and well-nourished and well groomed.   HEENT:Normocephalic, atraumatic, pupils are equal, round and reactive to light and accommodation. s/p cataract repairs. Extraocular tracking shows moderatesaccadic breakdown.Moderate to severe nuclear rigidity is noted. No lip, neck or jaw tremor is noted.He has mod hypophonia, no dysarthria noted. Oropharynx examination reveals no new findings, except moderate mouth dryness. Tongue is central, palate symmetrical. No sialorrhea.   Chest:Clear to auscultation without wheezing, rhonchi or crackles noted.  Heart:S1+S2+0, regular and normal without murmurs, rubs or gallops noted.   Abdomen:Soft, non-tender and non-distended.  Extremities:There is no edema in the distal lower extremities bilaterally.   Skin: Warm and dry without trophic changes noted.  Musculoskeletal: exam reveals no obvious joint deformities, tenderness or joint swelling or erythema, healing/healedscars from TKAs b/l.no obvious swelling, no pain, slight decrease in leg extension range of motion bilaterally.   Neurologically:  Mental status: The patient is awake, alert and oriented in all 4 spheres. Hisimmediate and remote memory, attention, language skills and fund of knowledge are mildly impaired with some slowness in thinking. Mood is normaland affect is normal.   On  12/21/2017: MMSE: 21/28, CDT: could not draw. AFT: 11/min.  Cranial nerves II - XII are as described above under HEENT exam. Motor exam: Normal bulk,globalstrengthof 4+/5, No restingtremor or rebound. Romberg is not tested for safety.Reflexes arenot  tested today. Fine motor exam shows severe impairment on the right, moderate impairment on the left.fairly good movement in R elbow. Cerebellar testing: No dysmetria or intention tremor.  Sensory exam: intact to light touch in the upper and lower extremities.  Gait, station and balance: he stands up with difficulty, has to push himself up, requires no actual assistance. He walks slowly with his 2 wheeled walker, turns in about multiple steps, balance is impaired. Significant decrease in stride length and decrease in pace. Stutter steps with right foot.  Assessment and Plan:   In summary, EBON KETCHUM a very pleasant 77 year old male with a history of atrial fibrillation, hypertension, hyperlipidemia, who presents for followup consultation of his right-sided predominant, Parkinson's disease, akinetic-rigid type, complicated by anxiety, mild depression, sleep disorder, (including sleep fragmentation, nocturia, RBD, insomnia), obesity, arthritis, with s/p left (2016), then right (2017)total knee replacement surgeriesand recurrent falls, Memory loss and visual hallucinations. He has had falls with injuries, sustained right triceps tendon injury in February 2019 which required surgery and prolonged rehabilitation. He has had progression with time, he has had anxiety and depression. For this he has been on Lexapro low-dose. For his memory loss I suggested we start him on low-dose generic Exelon 1.5 mg twice a day. We talked about potential side effects. I do believe he will benefit from evaluation and treatment through outpatient neuro rehabilitation, including PT, OT and ST. I suggested we continue with his current medication regimen otherwise  including Sinemet IR 2 pills 4 times a day, Sinemet CR 1 pill at bedtime, clonazepam low-dose at night and gabapentin low-dose. He has a U-step walker at home now. We increased the lexapro generic to 10 mg in Dec. 2017.for his RBD he has been on low-dose clonazepam. I suggested a 4 month checkup, sooner if needed. I answered all their questions today and the patient and his wife were in agreement. I spent 40 minutes in total face-to-face time with the patient, more than 50% of which was spent in counseling and coordination of care, reviewing test results, reviewing medication and discussing or reviewing the diagnosis of PD, its prognosis and treatment options. Pertinent laboratory and imaging test results that were available during this visit with the patient were reviewed by me and considered in my medical decision making (see chart for details).

## 2017-12-29 ENCOUNTER — Ambulatory Visit (INDEPENDENT_AMBULATORY_CARE_PROVIDER_SITE_OTHER): Payer: Medicare Other | Admitting: *Deleted

## 2017-12-29 DIAGNOSIS — Z5181 Encounter for therapeutic drug level monitoring: Secondary | ICD-10-CM | POA: Diagnosis not present

## 2017-12-29 DIAGNOSIS — I4891 Unspecified atrial fibrillation: Secondary | ICD-10-CM | POA: Diagnosis not present

## 2017-12-29 LAB — POCT INR: INR: 3.2 — AB (ref 2.0–3.0)

## 2017-12-29 NOTE — Patient Instructions (Addendum)
Description   Skip today's dosage of Coumadin, then change dose dosage to 1 tablet daily except 1.5 tablets each Wednesdays. Continue eating dark green leafy veggies three times a week. Recheck INR in 3 weeks with MD appt. Call with any questions or concerns [facility] [phone]

## 2018-01-05 ENCOUNTER — Encounter: Payer: Self-pay | Admitting: Sports Medicine

## 2018-01-05 ENCOUNTER — Ambulatory Visit (INDEPENDENT_AMBULATORY_CARE_PROVIDER_SITE_OTHER): Payer: Medicare Other | Admitting: Sports Medicine

## 2018-01-05 DIAGNOSIS — D689 Coagulation defect, unspecified: Secondary | ICD-10-CM | POA: Diagnosis not present

## 2018-01-05 DIAGNOSIS — M79676 Pain in unspecified toe(s): Secondary | ICD-10-CM | POA: Diagnosis not present

## 2018-01-05 DIAGNOSIS — B351 Tinea unguium: Secondary | ICD-10-CM

## 2018-01-05 NOTE — Progress Notes (Signed)
Subjective: Brian [name] is a 77 y.o. male patient seen today in office with complaint of mildly painful thickened and elongated toenails; unable to trim. Patient denies history of Diabetes or Neuropathy but is on anticoagulant for heart. Has been in our out of facilities after a fall and wife reports right-sided weakness and difficulties with gait secondary to Parkinson's. Patient has no other pedal complaints at this time.   Patient Active Problem List   Diagnosis Date Noted  . Rupture of right triceps tendon 05/20/2017  . Triceps tendon rupture, right, initial encounter 05/20/2017  . Atrial fibrillation ([place]) [I48.91] 02/05/2017  . Permanent atrial fibrillation   . Urinary retention 12/31/2015  . Acute renal failure (ARF) ([place]) 12/31/2015  . Acute encephalopathy 12/31/2015  . Primary osteoarthritis of right knee 07/12/2015  . Primary osteoarthritis of left knee 11/30/2014  . Encounter for therapeutic drug monitoring 06/21/2013  . Edema extremities 02/04/2013  . Chronic anticoagulation 02/04/2013  . GERD (gastroesophageal reflux disease)   . Restrictive lung disease   . CKD (chronic kidney disease) stage 2, GFR 60-89 ml/min   . Cellulitis   . Allergic rhinitis   . Anxiety   . Long term (current) use of anticoagulants 01/25/2013  . Parkinsonism ([place]) 06/22/2012  . Paresthesias 06/22/2012  . PULMONARY NODULE 06/02/2008  . SNORING 06/02/2008  . Hyperlipidemia 05/15/2008  . Essential hypertension 05/15/2008  . PULMONARY FUNCTION TESTS, ABNORMAL 05/15/2008    Current Outpatient Medications on File Prior to Visit  Medication Sig Dispense Refill  . ammonium lactate (LAC-HYDRIN) 12 % cream Apply topically as needed for dry skin. As prescribed (Patient taking differently: Apply 1 g topically as needed for dry skin. As prescribed) 385 g 3  . carbidopa-levodopa (SINEMET CR) 50-200 MG tablet Take 1 tablet by mouth at bedtime. 90 tablet 3  . carbidopa-levodopa (SINEMET IR) 25-100 MG tablet  Take 2 tablets by mouth 4 (four) times daily. 720 tablet 3  . cholecalciferol (VITAMIN D) 1000 units tablet Take 1,000 Units by mouth daily. 7 am    . clonazePAM (KLONOPIN) 0.5 MG disintegrating tablet Take 1 tablet (0.5 mg total) by mouth at bedtime. 90 tablet 3  . doxazosin (CARDURA) 2 MG tablet Take 2 mg by mouth at bedtime.     Marland Kitchen escitalopram (LEXAPRO) 10 MG tablet Take 1 tablet (10 mg total) by mouth at bedtime. 90 tablet 3  . famotidine (PEPCID) 40 MG tablet Take 40 mg by mouth every evening. 7 pm    . furosemide (LASIX) 20 MG tablet Take 20 mg by mouth 2 (two) times daily. 7 am, 7 pm    . gabapentin (NEURONTIN) 100 MG capsule Take 1 capsule (100 mg total) by mouth 3 (three) times daily. 270 capsule 3  . losartan (COZAAR) 50 MG tablet Take 50 mg by mouth every morning.    . metoprolol succinate (TOPROL-XL) 25 MG 24 hr tablet Take 25 mg by mouth every evening. 7 pm    . Multiple Vitamin (MULTIVITAMIN WITH MINERALS) TABS tablet Take 1 tablet by mouth daily. 7 am    . potassium chloride (K-DUR,KLOR-CON) 10 MEQ tablet Take 10 mEq by mouth 2 (two) times daily.     . pravastatin (PRAVACHOL) 40 MG tablet Take 40 mg by mouth every morning.     . rivastigmine (EXELON) 1.5 MG capsule Take 1 capsule (1.5 mg total) by mouth 2 (two) times daily. 60 capsule 5  . sodium chloride (OCEAN) 0.65 % SOLN nasal spray Place 1 spray into  both nostrils daily as needed for congestion.    . tamsulosin (FLOMAX) 0.4 MG CAPS capsule Take 0.4 mg at bedtime by mouth.    . vitamin C (ASCORBIC ACID) 500 MG tablet Take 500 mg by mouth every morning.    . warfarin (COUMADIN) 5 MG tablet TAKE AS DIRECTED BY MOUTH BY COUMADIN CLINIC 110 tablet 1   No current facility-administered medications on file prior to visit.     Allergies  Allergen Reactions  . Lisinopril Cough    Objective: Physical Exam  General: Well developed, nourished, no acute distress, awake, alert and oriented x 3  Vascular: Dorsalis pedis artery 1/4  bilateral, Posterior tibial artery 1/4 bilateral, skin temperature warm to warm proximal to distal bilateral lower extremities, minimal varicosities, decreased pedal hair present bilateral.  Neurological: Gross sensation present via light touch bilateral.   Dermatological: Skin is warm, dry, and supple bilateral, Nails 1-10 are tender, long, thick, and discolored with mild subungal debris, no webspace macerations present bilateral, no open lesions present bilateral, no callus/corns/hyperkeratotic tissue present bilateral. No signs of infection bilateral.  Musculoskeletal: Asymptomatic pes planus boney deformities noted bilateral. Muscular strength within normal limits without painon range of motion. No pain with calf compression bilateral.  Assessment and Plan:  Problem List Items Addressed This Visit    None    Visit Diagnoses    Pain due to onychomycosis of toenail    -  Primary   Coagulation defect ([place])           -Examined patient.  -Discussed treatment options for painful mycotic nails. -Mechanically debrided and reduced mycotic nails with sterile nail nipper and dremel nail file without incident. -Patient to return in 3 months for follow up evaluation or sooner if symptoms worsen.  Landis Martins, DPM

## 2018-01-20 ENCOUNTER — Ambulatory Visit (INDEPENDENT_AMBULATORY_CARE_PROVIDER_SITE_OTHER): Payer: Medicare Other | Admitting: *Deleted

## 2018-01-20 ENCOUNTER — Ambulatory Visit (INDEPENDENT_AMBULATORY_CARE_PROVIDER_SITE_OTHER): Payer: Medicare Other | Admitting: Cardiology

## 2018-01-20 VITALS — BP 124/72 | HR 74 | Ht 74.0 in | Wt 227.0 lb

## 2018-01-20 DIAGNOSIS — I4891 Unspecified atrial fibrillation: Secondary | ICD-10-CM | POA: Diagnosis not present

## 2018-01-20 DIAGNOSIS — Z5181 Encounter for therapeutic drug level monitoring: Secondary | ICD-10-CM

## 2018-01-20 DIAGNOSIS — I4821 Permanent atrial fibrillation: Secondary | ICD-10-CM | POA: Diagnosis not present

## 2018-01-20 DIAGNOSIS — I10 Essential (primary) hypertension: Secondary | ICD-10-CM

## 2018-01-20 DIAGNOSIS — R600 Localized edema: Secondary | ICD-10-CM

## 2018-01-20 LAB — POCT INR: INR: 2.5 (ref 2.0–3.0)

## 2018-01-20 LAB — BASIC METABOLIC PANEL
BUN / CREAT RATIO: 15 (ref 10–24)
BUN: 17 mg/dL (ref 8–27)
CO2: 25 mmol/L (ref 20–29)
CREATININE: 1.12 mg/dL (ref 0.76–1.27)
Calcium: 9.5 mg/dL (ref 8.6–10.2)
Chloride: 99 mmol/L (ref 96–106)
GFR calc non Af Amer: 64 mL/min/{1.73_m2} (ref >59)
GFR, EST AFRICAN AMERICAN: 74 mL/min/{1.73_m2} (ref >59)
GLUCOSE: 89 mg/dL (ref 65–99)
Potassium: 4.2 mmol/L (ref 3.5–5.2)
SODIUM: 139 mmol/L (ref 134–144)

## 2018-01-20 NOTE — Progress Notes (Signed)
Cardiology Office Note:    Date:  01/20/2018   ID:  Brian [name], DOB 07-11-1940, MRN [id number]  PCP:  Wenda Low, MD  Cardiologist:  Fransico Him, MD    Referring MD: Wenda Low, MD   Chief Complaint  Patient presents with  . Atrial Fibrillation  . Hypertension    History of Present Illness:    Brian [name] is a 77 y.o. male with a hx of permanent atrial fibrillation on chronic systemic anticoagulation, chronic LE edema and HTN.  He also has Parkinson's Disease which has significantly progressed since I saw him last and he is walking very slowly with a cane.  He is here today for followup and is doing well.  He denies any chest pain or pressure, SOB, DOE, PND, orthopnea, LE edema, dizziness, palpitations or syncope. He is compliant with his meds and is tolerating meds with no SE.    Past Medical History:  Diagnosis Date  . Abnormal PFT 1. 05/18/08  2. 11/30/08   1. Showed mild airflow obstruction, mild restriction, mild diffusion defect; FEV1 2.22(64%), FVC 3.33(65%), FEVi% 67, TLC 5.19(69%), DLCO 77%, +BD  2. FEV1 2.38(73%), FVC 3.81(80%), FEV1% 63, TLC 5.61(80%), DLCO 79%, no BD  . Allergic rhinitis   . Anxiety   . BPH (benign prostatic hyperplasia)   . Cellulitis    right leg MRSA  . Erectile dysfunction   . GERD (gastroesophageal reflux disease)   . Heart murmur    mild MR by echo  . Hyperlipidemia   . Hypertension   . Osteoarthritis   . Paresthesias 06/22/2012  . Parkinsonism ([place]) 06/22/2012  . Permanent atrial fibrillation   . Restrictive lung disease     Past Surgical History:  Procedure Laterality Date  . CARDIAC CATHETERIZATION  11/2009   normal coronary arteries  . CARDIOVERSION     multiple  . CATARACT EXTRACTION    . COLONOSCOPY    . COLONOSCOPY WITH PROPOFOL N/A 09/02/2016   Procedure: COLONOSCOPY WITH PROPOFOL;  Surgeon: Garlan Fair, MD;  Location: [facility];  Service: Endoscopy;  Laterality: N/A;  . EYE SURGERY     bilateral cataract  extraction  . JOINT REPLACEMENT    . TOTAL KNEE ARTHROPLASTY Left 11/30/2014   Procedure: TOTAL KNEE ARTHROPLASTY;  Surgeon: Susa Day, MD;  Location: [facility];  Service: Orthopedics;  Laterality: Left;  . TOTAL KNEE ARTHROPLASTY Right 07/12/2015   Procedure: RIGHT TOTAL KNEE ARTHROPLASTY;  Surgeon: Susa Day, MD;  Location: [facility];  Service: Orthopedics;  Laterality: Right;  . TRICEPS TENDON REPAIR Right 05/20/2017  . TRICEPS TENDON REPAIR Right 05/20/2017   Procedure: TRICEPS TENDON REPAIR;  Surgeon: Iran Planas, MD;  Location: [facility];  Service: Orthopedics;  Laterality: Right;    Current Medications: Current Meds  Medication Sig  . ammonium lactate (LAC-HYDRIN) 12 % cream Apply topically as needed for dry skin. As prescribed (Patient taking differently: Apply 1 g topically as needed for dry skin. As prescribed)  . carbidopa-levodopa (SINEMET CR) 50-200 MG tablet Take 1 tablet by mouth at bedtime.  . carbidopa-levodopa (SINEMET IR) 25-100 MG tablet Take 2 tablets by mouth 4 (four) times daily.  . cholecalciferol (VITAMIN D) 1000 units tablet Take 1,000 Units by mouth daily. 7 am  . clonazePAM (KLONOPIN) 0.5 MG disintegrating tablet Take 1 tablet (0.5 mg total) by mouth at bedtime.  Marland Kitchen doxazosin (CARDURA) 2 MG tablet Take 2 mg by mouth at bedtime.   Marland Kitchen escitalopram (LEXAPRO) 10 MG tablet Take  1 tablet (10 mg total) by mouth at bedtime.  . famotidine (PEPCID) 40 MG tablet Take 40 mg by mouth every evening. 7 pm  . furosemide (LASIX) 20 MG tablet Take 20 mg by mouth 2 (two) times daily. 7 am, 7 pm  . gabapentin (NEURONTIN) 100 MG capsule Take 1 capsule (100 mg total) by mouth 3 (three) times daily.  Marland Kitchen losartan (COZAAR) 50 MG tablet Take 50 mg by mouth every morning.  . metoprolol succinate (TOPROL-XL) 25 MG 24 hr tablet Take 25 mg by mouth every evening. 7 pm  . Multiple Vitamin (MULTIVITAMIN WITH MINERALS) TABS tablet Take 1 tablet by mouth daily. 7 am  . potassium chloride  (K-DUR,KLOR-CON) 10 MEQ tablet Take 10 mEq by mouth 2 (two) times daily.   . pravastatin (PRAVACHOL) 40 MG tablet Take 40 mg by mouth every morning.   . rivastigmine (EXELON) 1.5 MG capsule Take 1 capsule (1.5 mg total) by mouth 2 (two) times daily.  . sodium chloride (OCEAN) 0.65 % SOLN nasal spray Place 1 spray into both nostrils daily as needed for congestion.  . tamsulosin (FLOMAX) 0.4 MG CAPS capsule Take 0.4 mg at bedtime by mouth.  . vitamin C (ASCORBIC ACID) 500 MG tablet Take 500 mg by mouth every morning.  . warfarin (COUMADIN) 5 MG tablet TAKE AS DIRECTED BY MOUTH BY COUMADIN CLINIC     Allergies:   Lisinopril   Social History   Socioeconomic History  . Marital status: Married    Spouse name: [name]  . Number of children: 4  . Years of education: masters  . Highest education level: Not on file  Occupational History  . Occupation: Chief of Staff    Comment: Worked with [employer]  . Financial resource strain: Not on file  . Food insecurity:    Worry: Not on file    Inability: Not on file  . Transportation needs:    Medical: Not on file    Non-medical: Not on file  Tobacco Use  . Smoking status: Never Smoker  . Smokeless tobacco: Never Used  Substance and Sexual Activity  . Alcohol use: No    Alcohol/week: 0.0 standard drinks  . Drug use: No  . Sexual activity: Not on file  Lifestyle  . Physical activity:    Days per week: Not on file    Minutes per session: Not on file  . Stress: Not on file  Relationships  . Social connections:    Talks on phone: Not on file    Gets together: Not on file    Attends religious service: Not on file    Active member of club or organization: Not on file    Attends meetings of clubs or organizations: Not on file    Relationship status: Not on file  Other Topics Concern  . Not on file  Social History Narrative   Married and lives in [place].  Retired Optometrist.   No reported caffeine use.      Family  History: The patient's family history includes Diabetes in his mother; Pancreatic cancer in his brother and other; Stroke in his father.  ROS:   Please see the history of present illness.    ROS  All other systems reviewed and negative.   EKGs/Labs/Other Studies Reviewed:    The following studies were reviewed today: none  EKG:  EKG is  ordered today.  The ekg ordered today demonstrates atrial fibrillation with nonspecific ST abnormality  Recent Labs: 05/20/2017:  BUN 16; Creatinine, Ser 1.03; Potassium 4.0; Sodium 140 05/22/2017: Hemoglobin 12.2; Platelets 159   Recent Lipid Panel No results found for: CHOL, TRIG, HDL, CHOLHDL, VLDL, LDLCALC, LDLDIRECT  Physical Exam:    VS:  BP 124/72   Pulse 74   Ht '6\' 2"'  (1.88 m)   Wt 227 lb (103 kg)   BMI 29.15 kg/m     Wt Readings from Last 3 Encounters:  01/20/18 227 lb (103 kg)  12/21/17 226 lb (102.5 kg)  08/18/17 232 lb (105.2 kg)     GEN:  Well nourished, well developed in no acute distress HEENT: Normal NECK: No JVD; No carotid bruits LYMPHATICS: No lymphadenopathy CARDIAC: irreuglary irregular, no murmurs, rubs, gallops RESPIRATORY:  Clear to auscultation without rales, wheezing or rhonchi  ABDOMEN: Soft, non-tender, non-distended MUSCULOSKELETAL:  No edema; No deformity  SKIN: Warm and dry NEUROLOGIC:  Alert and oriented x 3 PSYCHIATRIC:  Normal affect   ASSESSMENT:    1. Permanent atrial fibrillation   2. Essential hypertension   3. Edema of extremities    PLAN:    In order of problems listed above:  1.  Permanent atrial fibrillation -he is well rate controlled on EKG today.  He will continue on Toprol and warfarin.  He is followed in our Coumadin clinic.  2.  Hypertension - he is well controlled on exam today.  He will continue on doxazosin 2 mg nightly, losartan 38m daily, Toprol-XL 25 mg daily.  3.  Lower extremity edema - this is resolved on diuretic therapy.  He will continue on Lasix 20 mg twice daily.   I will check a be met today.   Medication Adjustments/Labs and Tests Ordered: Current medicines are reviewed at length with the patient today.  Concerns regarding medicines are outlined above.  Orders Placed This Encounter  Procedures  . EKG 12-Lead   No orders of the defined types were placed in this encounter.   Signed, TFransico Him MD  01/20/2018 10:24 AM    [place]

## 2018-01-20 NOTE — Patient Instructions (Signed)
Description   Continue taking 1 tablet daily except 1.5 tablets each Wednesdays. Continue eating dark green leafy veggies three times a week. Recheck INR in 4 weeks. Call with any questions or concerns Coumadin Clinic [phone]

## 2018-01-20 NOTE — Patient Instructions (Signed)
Medication Instructions:  Your physician recommends that you continue on your current medications as directed. Please refer to the Current Medication list given to you today.  If you need a refill on your cardiac medications before your next appointment, please call your pharmacy.   Lab work: Today: BMET If you have labs (blood work) drawn today and your tests are completely normal, you will receive your results only by: . MyChart Message (if you have MyChart) OR . A paper copy in the mail If you have any lab test that is abnormal or we need to change your treatment, we will call you to review the results.  Follow-Up: At [facility], you and your health needs are our priority.  As part of our continuing mission to provide you with exceptional heart care, we have created designated Provider Care Teams.  These Care Teams include your primary Cardiologist (physician) and Advanced Practice Providers (APPs -  Physician Assistants and Nurse Practitioners) who all work together to provide you with the care you need, when you need it. You will need a follow up appointment in 1 years.  Please call our office 2 months in advance to schedule this appointment.  You may see Traci Turner, MD or one of the following Advanced Practice Providers on your designated Care Team:   Brittainy Simmons, PA-C Dayna Dunn, PA-C . Michele Lenze, PA-C    

## 2018-01-21 ENCOUNTER — Ambulatory Visit: Payer: Medicare Other | Attending: Internal Medicine | Admitting: Physical Therapy

## 2018-01-21 ENCOUNTER — Ambulatory Visit: Payer: Medicare Other

## 2018-01-21 ENCOUNTER — Encounter: Payer: Self-pay | Admitting: Physical Therapy

## 2018-01-21 DIAGNOSIS — R1310 Dysphagia, unspecified: Secondary | ICD-10-CM | POA: Diagnosis not present

## 2018-01-21 DIAGNOSIS — R471 Dysarthria and anarthria: Secondary | ICD-10-CM | POA: Diagnosis not present

## 2018-01-21 DIAGNOSIS — R29818 Other symptoms and signs involving the nervous system: Secondary | ICD-10-CM

## 2018-01-21 DIAGNOSIS — M6281 Muscle weakness (generalized): Secondary | ICD-10-CM | POA: Diagnosis not present

## 2018-01-21 DIAGNOSIS — R2689 Other abnormalities of gait and mobility: Secondary | ICD-10-CM | POA: Insufficient documentation

## 2018-01-21 DIAGNOSIS — R2681 Unsteadiness on feet: Secondary | ICD-10-CM | POA: Insufficient documentation

## 2018-01-21 DIAGNOSIS — R293 Abnormal posture: Secondary | ICD-10-CM | POA: Insufficient documentation

## 2018-01-21 NOTE — Patient Instructions (Signed)
   Do 5 loud "[place]" twice each day.  Read out loud for 30 seconds, twice each day.

## 2018-01-22 ENCOUNTER — Ambulatory Visit: Payer: Medicare Other | Admitting: Physical Therapy

## 2018-01-22 ENCOUNTER — Encounter: Payer: Self-pay | Admitting: Physical Therapy

## 2018-01-22 ENCOUNTER — Ambulatory Visit: Payer: Medicare Other | Attending: Internal Medicine | Admitting: Occupational Therapy

## 2018-01-22 DIAGNOSIS — R471 Dysarthria and anarthria: Secondary | ICD-10-CM | POA: Diagnosis not present

## 2018-01-22 DIAGNOSIS — R278 Other lack of coordination: Secondary | ICD-10-CM | POA: Diagnosis not present

## 2018-01-22 DIAGNOSIS — R2689 Other abnormalities of gait and mobility: Secondary | ICD-10-CM

## 2018-01-22 DIAGNOSIS — R2681 Unsteadiness on feet: Secondary | ICD-10-CM | POA: Insufficient documentation

## 2018-01-22 DIAGNOSIS — R29818 Other symptoms and signs involving the nervous system: Secondary | ICD-10-CM | POA: Diagnosis not present

## 2018-01-22 DIAGNOSIS — R1310 Dysphagia, unspecified: Secondary | ICD-10-CM | POA: Diagnosis not present

## 2018-01-22 DIAGNOSIS — M6281 Muscle weakness (generalized): Secondary | ICD-10-CM | POA: Diagnosis not present

## 2018-01-22 DIAGNOSIS — R293 Abnormal posture: Secondary | ICD-10-CM | POA: Insufficient documentation

## 2018-01-22 DIAGNOSIS — R41844 Frontal lobe and executive function deficit: Secondary | ICD-10-CM | POA: Diagnosis not present

## 2018-01-22 NOTE — Therapy (Signed)
[facility] [address] [place], [place], [address] Phone: [phone]   Fax:  [phone]  Speech Language Pathology Evaluation  Patient Details  Name: Brian [name] MRN: [id number] Date of Birth: 1940/09/19 Referring Provider (SLP): Star Age, MD   Encounter Date: 01/21/2018  End of Session - 01/22/18 0001    Visit Number  1    Number of Visits  17    Date for SLP Re-Evaluation  04/21/18    SLP Start Time  0355    SLP Stop Time   1401    SLP Time Calculation (min)  43 min    Activity Tolerance  Patient tolerated treatment well       Past Medical History:  Diagnosis Date  . Abnormal PFT 1. 05/18/08  2. 11/30/08   1. Showed mild airflow obstruction, mild restriction, mild diffusion defect; FEV1 2.22(64%), FVC 3.33(65%), FEVi% 67, TLC 5.19(69%), DLCO 77%, +BD  2. FEV1 2.38(73%), FVC 3.81(80%), FEV1% 63, TLC 5.61(80%), DLCO 79%, no BD  . Allergic rhinitis   . Anxiety   . BPH (benign prostatic hyperplasia)   . Cellulitis    right leg MRSA  . Erectile dysfunction   . GERD (gastroesophageal reflux disease)   . Heart murmur    mild MR by echo  . Hyperlipidemia   . Hypertension   . Osteoarthritis   . Paresthesias 06/22/2012  . Parkinsonism ([place]) 06/22/2012  . Permanent atrial fibrillation   . Restrictive lung disease     Past Surgical History:  Procedure Laterality Date  . CARDIAC CATHETERIZATION  11/2009   normal coronary arteries  . CARDIOVERSION     multiple  . CATARACT EXTRACTION    . COLONOSCOPY    . COLONOSCOPY WITH PROPOFOL N/A 09/02/2016   Procedure: COLONOSCOPY WITH PROPOFOL;  Surgeon: Garlan Fair, MD;  Location: [facility];  Service: Endoscopy;  Laterality: N/A;  . EYE SURGERY     bilateral cataract extraction  . JOINT REPLACEMENT    . TOTAL KNEE ARTHROPLASTY Left 11/30/2014   Procedure: TOTAL KNEE ARTHROPLASTY;  Surgeon: Susa Day, MD;  Location: [facility];  Service: Orthopedics;   Laterality: Left;  . TOTAL KNEE ARTHROPLASTY Right 07/12/2015   Procedure: RIGHT TOTAL KNEE ARTHROPLASTY;  Surgeon: Susa Day, MD;  [facility];  Service: Orthopedics;  Laterality: Right;  . TRICEPS TENDON REPAIR Right 05/20/2017  . TRICEPS TENDON REPAIR Right 05/20/2017   Procedure: TRICEPS TENDON REPAIR;  Surgeon: Iran Planas, MD;  Location: [facility];  Service: Orthopedics;  Laterality: Right;    There were no vitals filed for this visit.  Subjective Assessment - 01/21/18 1314    Subjective  Pt is familiar to SLP from screen in August - OPST orders were rec'd however pt was in HHPT at the time and eval did not occur because of this. He returns today.    Patient is accompained by:  Family member   wife   Currently in Pain?  No/denies         SLP Evaluation [facility] - 01/22/18 9741      SLP Visit Information   SLP Received On  01/21/18    Referring Provider (SLP)  Star Age, MD    Onset Date  2012    Medical Diagnosis  Parkinson's disease      Subjective   Subjective  Pt acknlowledges he has a low speech volume.    Patient/Family Stated Goal  "Speak to others in a group without my voice  giving out."      General Information   HPI  PD 2012 diagnosis, knee replaced 2016, and the other 6 mo. later in 2017. Fall in bathroom Feb 2019 - went to SNF and had OT and PT, pt then had [facility] OT and PT.      Balance Screen   Has the patient fallen in the past 6 months  Yes    How many times?  4    Has the patient had a decrease in activity level because of a fear of falling?   Yes   "I'm reluctant to move around my home"   Is the patient reluctant to leave their home because of a fear of falling?   No      Prior Functional Status   Cognitive/Linguistic Baseline  Within functional limits      Cognition   Overall Cognitive Status  Impaired/Different from baseline    Area of Impairment  Memory    Memory Comments  pt looking to wife to answer medical history questions, # of falls, etc       Auditory Comprehension   Overall Auditory Comprehension  Appears within functional limits for tasks assessed      Verbal Expression   Overall Verbal Expression  Impaired    Level of Generative/Spontaneous Verbalization  Conversation    Interfering Components  Speech intelligibility    Effective Techniques  --   extra time provided   Other Verbal Expression Comments  In conversation, pt with occasional pausing and hesitation in communication of his message, indicating difficulty with langauge construction/word finding.      Oral Motor/Sensory Function   Overall Oral Motor/Sensory Function  Impaired    Labial ROM  Within Functional Limits   with mod cues    Labial Symmetry  Within Functional Limits    Labial Strength  Within Functional Limits    Labial Coordination  Reduced    Lingual ROM  Reduced right;Reduced left    Lingual Symmetry  Within Functional Limits    Lingual Strength  Reduced    Lingual Coordination  Reduced    Velum  Within Functional Limits      Motor Speech   Overall Motor Speech  Impaired    Respiration  Impaired    Phonation  Low vocal intensity;Hoarse   /a/mid 80s;vol decr'd mid 60's long utterances&over time   Articulation  Impaired    Level of Impairment  Phrase    Intelligibility  Intelligibility reduced   short rushes of speech occasionally   Phrase  --   95%   Sentence  --   90%   Conversation  --   85-90%   Effective Techniques  Slow rate;Increased vocal intensity;Over-articulate   low 70s dB after loud /a/-rare min-mod cues for loud                     SLP Education - 01/22/18 0842    Education Details  eval results, possible goals    Person(s) Educated  Patient    Methods  Explanation    Comprehension  Verbalized understanding       SLP Short Term Goals - 01/22/18 1012      SLP SHORT TERM GOAL #1   Title  Pt will generate loud /a/ with average of upper 80s dB over 3 sessions    Time  4    Period  Weeks    Status   New      SLP SHORT  TERM GOAL #2   Title  Pt will use abdominal breathing at rest 85% success over 2 sessions    Time  4    Period  Weeks    Status  New      SLP SHORT TERM GOAL #3   Title  Pt will use speech volume average low 70sdB when responding with sentences 18/20 over two sessions    Time  4    Period  Weeks    Status  New      SLP SHORT TERM GOAL #4   Title  pt will tell SLP 3 anomia strategies over three sessions    Time  4    Period  Weeks    Status  New       SLP Long Term Goals - 01/22/18 1017      SLP LONG TERM GOAL #1   Title  pt will generate loud /a/ at upper 80s - low 90s dB over 4 sessions    Time  8    Period  Weeks   or 17 sessions, for all LTGs   Status  New      SLP LONG TERM GOAL #2   Title  Pt will use abdominal breathing in sentences or short conversation 80% success over 2 sessions    Time  8    Period  Weeks    Status  New      SLP LONG TERM GOAL #3   Title  pt will use anomia compensations in 75% of opportunities in sentence responses, with min verbal cues to use them, over 3 sessions    Time  8    Period  Weeks    Status  New      SLP LONG TERM GOAL #4   Title  pt will undergo objective swallow eval PRN    Time  4    Period  Weeks       Plan - 01/22/18 4627    Clinical Impression Statement  Pt presents with dysarthria including intermittent reduced speech volume (especially with longer utterances and as conversation progresses), reduced breath support, and imprecise consonants which all reduce pt's speech intelligibility. Pt desires to be able to communicate easier (see "pt therapy goals" above), and wife indicates she has to ask pt to repeat himself at least daily. Pt also complains of (and was observed by SLP today) of occasional difficulty in sentence construction. Pt may be administered the [employer] in subsequent sessions. Pt produced short conversation with average of upper 60s-low 70sdB after producing loud /a/,  demonstrating good stimulability for skilled ST to increase speech intelligibilty. Pt may require a modified barium swallow or FEES at some point during this therapy course. He would benefit from skilled ST addressing these skills to communicate more efficiently.     Speech Therapy Frequency  2x / week    Duration  --   8 weeks, or 17 total sessions   Treatment/Interventions  Aspiration precaution training;Pharyngeal strengthening exercises;Diet toleration management by SLP;Language facilitation;Compensatory techniques;Internal/external aids;SLP instruction and feedback;Multimodal communcation approach;Functional tasks;Patient/family education;Compensatory strategies;Trials of upgraded texture/liquids;Cueing hierarchy;Environmental controls;Cognitive reorganization   objective swallow evaluation   Potential to Achieve Goals  Good    Consulted and Agree with Plan of Care  Patient;Family member/caregiver    Family Member Consulted  wife       Patient will benefit from skilled therapeutic intervention in order to improve the following deficits and impairments:   Dysarthria and anarthria  Dysphagia,  unspecified type    Problem List Patient Active Problem List   Diagnosis Date Noted  . Rupture of right triceps tendon 05/20/2017  . Triceps tendon rupture, right, initial encounter 05/20/2017  . Atrial fibrillation ([place]) [I48.91] 02/05/2017  . Urinary retention 12/31/2015  . Acute renal failure (ARF) ([place]) 12/31/2015  . Acute encephalopathy 12/31/2015  . Primary osteoarthritis of right knee 07/12/2015  . Primary osteoarthritis of left knee 11/30/2014  . Encounter for therapeutic drug monitoring 06/21/2013  . Edema of extremities 02/04/2013  . Chronic anticoagulation 02/04/2013  . GERD (gastroesophageal reflux disease)   . Restrictive lung disease   . CKD (chronic kidney disease) stage 2, GFR 60-89 ml/min   . Cellulitis   . Allergic rhinitis   . Anxiety   . Long term (current) use of  anticoagulants 01/25/2013  . Parkinsonism ([place]) 06/22/2012  . Paresthesias 06/22/2012  . PULMONARY NODULE 06/02/2008  . SNORING 06/02/2008  . Hyperlipidemia 05/15/2008  . Essential hypertension 05/15/2008  . PULMONARY FUNCTION TESTS, ABNORMAL 05/15/2008    [facility] ,MS, CCC-SLP  01/22/2018, 10:21 AM  [facility] [address] [place], [place], [address] Phone: [phone]   Fax:  [phone]  Name: Brian [name] MRN: [id number] Date of Birth: [birth year]

## 2018-01-22 NOTE — Therapy (Signed)
[facility] [address] [place], [place], [address] Phone: [phone]   Fax:  [phone]  Physical Therapy Evaluation  Patient Details  Name: Brian [name] MRN: [id number] Date of Birth: 10/25/1940 Referring Provider (PT): Rexene Alberts   Encounter Date: 01/21/2018  PT End of Session - 01/21/18 2030    Visit Number  1    Number of Visits  18    Date for PT Re-Evaluation  04/21/18    Authorization Type  Medicare and [place] Time  1405    PT Stop Time  1448    PT Time Calculation (min)  43 min    Activity Tolerance  Patient tolerated treatment well    Behavior During Therapy  [facility] for tasks assessed/performed;Flat affect       Past Medical History:  Diagnosis Date  . Abnormal PFT 1. 05/18/08  2. 11/30/08   1. Showed mild airflow obstruction, mild restriction, mild diffusion defect; FEV1 2.22(64%), FVC 3.33(65%), FEVi% 67, TLC 5.19(69%), DLCO 77%, +BD  2. FEV1 2.38(73%), FVC 3.81(80%), FEV1% 63, TLC 5.61(80%), DLCO 79%, no BD  . Allergic rhinitis   . Anxiety   . BPH (benign prostatic hyperplasia)   . Cellulitis    right leg MRSA  . Erectile dysfunction   . GERD (gastroesophageal reflux disease)   . Heart murmur    mild MR by echo  . Hyperlipidemia   . Hypertension   . Osteoarthritis   . Paresthesias 06/22/2012  . Parkinsonism ([place]) 06/22/2012  . Permanent atrial fibrillation   . Restrictive lung disease     Past Surgical History:  Procedure Laterality Date  . CARDIAC CATHETERIZATION  11/2009   normal coronary arteries  . CARDIOVERSION     multiple  . CATARACT EXTRACTION    . COLONOSCOPY    . COLONOSCOPY WITH PROPOFOL N/A 09/02/2016   Procedure: COLONOSCOPY WITH PROPOFOL;  Surgeon: Garlan Fair, MD;  [facility];  Service: Endoscopy;  Laterality: N/A;  . EYE SURGERY     bilateral cataract extraction  . JOINT REPLACEMENT    . TOTAL KNEE ARTHROPLASTY Left 11/30/2014   Procedure: TOTAL  KNEE ARTHROPLASTY;  Surgeon: Susa Day, MD;  [facility];  Service: Orthopedics;  Laterality: Left;  . TOTAL KNEE ARTHROPLASTY Right 07/12/2015   Procedure: RIGHT TOTAL KNEE ARTHROPLASTY;  Surgeon: Susa Day, MD;  [facility];  Service: Orthopedics;  Laterality: Right;  . TRICEPS TENDON REPAIR Right 05/20/2017  . TRICEPS TENDON REPAIR Right 05/20/2017   Procedure: TRICEPS TENDON REPAIR;  Surgeon: Iran Planas, MD;  Location: [facility];  Service: Orthopedics;  Laterality: Right;    There were no vitals filed for this visit.   Subjective Assessment - 01/21/18 1414    Subjective  Pt has had Parkinson's disease June 2012.  Voice is weak, can't make my R leg do what I want it to do.  Fell in February with R triceps tendon tear and was in rehab at [facility] and then [facility].  Has U-step RW which he uses mostly at home, uses RW for most outdoor activities.  Has had 4 falls in the past 6 months (not counting Feb fall)-maybe misjudges distance, pushes walker to far in front.  Falls forward mostly.  Needs EMS assist for getting up from the floor.    Patient is accompained by:  Family member   Wife, [name]   Pertinent History  Fall February 2019 with tricep tendon tear,  s/p 3 months [facility] and then [facility] therapies, L TKR 9/216, R TKR 06/2015    Patient Stated Goals  Pt's goal for PT is to drive car again, to help with balance.    Currently in Pain?  No/denies         [facility] PT Assessment - 01/21/18 1421      Assessment   Medical Diagnosis  Parkinson's disease    Referring Provider (PT)  Athar    Onset Date/Surgical Date  12/21/17    Hand Dominance  Right      Precautions   Precautions  Fall      Restrictions   Weight Bearing Restrictions  No      Balance Screen   Has the patient fallen in the past 6 months  Yes    How many times?  4    Has the patient had a decrease in activity level because of a fear of falling?   Yes    Is the patient reluctant to leave their home  because of a fear of falling?   Yes      [place]  Private residence    Living Arrangements  Spouse/significant other    Available Help at Discharge  Family    Type of [place] to enter    Entrance Stairs-Number of Steps  [place]  Two level;Bed/bath upstairs    [place] - 2 wheels;Cane - single point   U-Step walker     Prior Function   Level of Independence  Independent   Fully independent prior to TKRs   Leisure  Prior to TKRs, pt was independently working out at gym; has tried Dietitian, Clinical research associate at [employer]      Observation/Other [employer] on Therapeutic Outcomes (FOTO)   NA      Posture/Postural Control   Posture/Postural Control  Postural limitations    Postural Limitations  Rounded Shoulders;Forward head;Posterior pelvic tilt;Flexed trunk      ROM / Strength   AROM / PROM / Strength  Strength;AROM      AROM   Overall AROM   Deficits    Overall AROM Comments  Tightness noted in hamstrings R>L      Strength   Overall Strength  Deficits    Overall Strength Comments  Grossly tested 4/5 bilateral lower extremities      Bed Mobility   Bed Mobility  --   Requires assistance of wife for bed mobility     Transfers   Transfers  Sit to Stand;Stand to Sit    Sit to Stand  4: Min assist;From chair/3-in-1;4: Min guard    Sit to Stand Details (indicate cue type and reason)  Performs with narrow BOS and definite use of UE support    Five time sit to stand comments   Attempted, but pt unable.    Stand to Sit  4: Min guard;With upper extremity assist;To chair/3-in-1   slowed turn to sit, leaves walker to try to walk to chair     Ambulation/Gait   Ambulation/Gait  Yes    Ambulation/Gait Assistance  4: Min guard    Ambulation Distance (Feet)  120 Feet   then 60 with cues for large amplitude step length   Assistive device  --   U-step RW   Gait Pattern   Decreased step  length - right;Decreased step length - left;Right flexed knee in stance;Shuffle;Festinating;Trunk flexed;Narrow base of support;Poor foot clearance - right    Ambulation Surface  Level;Indoor    Gait velocity  29.19 sec = 1.12 ft/sec      Standardized Balance Assessment   Standardized Balance Assessment  Timed Up and Go Test      Timed Up and Go Test   Normal TUG (seconds)  85.54    TUG Comments  TUG scores >13.5 seconds indicate increased fall risk; >30 seconds indicates difficulty with ADLs in the home.                Objective measurements completed on examination: See above findings.             Also educated pt/wife in importance of performing HEP recommended by PT as well as recommendations for mobility cueing and strategies at home for optimal carryover. PT Education - 01/21/18 2029    Education Details  Educated patient/wife on transfer technique, on cues for increased step length, staying close to walker with gait; educated in safety with staying with U-step RW until turn fully completed to sit to chair    Person(s) Educated  Patient;Spouse    Methods  Explanation;Demonstration;Verbal cues    Comprehension  Verbalized understanding;Returned demonstration;Verbal cues required;Need further instruction       PT Short Term Goals - 01/22/18 1242      PT SHORT TERM GOAL #1   Title  Pt will perform HEP with wife's supervision for improved posture, transfers, balance, gait.  TARGET 02/26/18    Time  5    Period  Weeks    Status  New    Target Date  02/26/18      PT SHORT TERM GOAL #2   Title  Pt will perform at least 6 of 10 reps of sit<>stand with minimal UE support for improved transfer efficiency and safety.    Time  5    Period  Weeks    Status  New    Target Date  02/26/18      PT SHORT TERM GOAL #3   Title  Pt will improve TUG score to less than or equal to 70 seconds for decreased fall risk.    Time  5    Period  Weeks    Status   New    Target Date  02/26/18      PT SHORT TERM GOAL #4   Title  Pt/wife will demonstrate understanding of tips to reduce freezing with gait and turns.    Time  5    Period  Weeks    Status  New    Target Date  02/26/18        PT Long Term Goals - 01/22/18 1247      PT LONG TERM GOAL #1   Title  Pt/wife will verbalize understanding of fall prevention to decrease fall risk in home environment.  TARGET 03/26/2018    Time  9    Period  Weeks    Status  New    Target Date  03/26/18      PT LONG TERM GOAL #2   Title  Pt will perform at least 10 reps of sit<>stand with minimal to no UE support, modified independently for improved transfer efficiency and safety.    Time  9    Period  Weeks    Status  New    Target Date  03/26/18  PT LONG TERM GOAL #3   Title  Pt will improve TUG score to less than or equal to 50 seconds for decreased fall risk.    Time  9    Period  Weeks    Status  New    Target Date  03/26/18      PT LONG TERM GOAL #4   Title  Pt will improve gait velocity to at least 1.8 ft/sec for decreased fall risk, improved gait efficiency and safety.    Time  9    Period  Weeks    Status  New    Target Date  03/26/18      PT LONG TERM GOAL #5   Title  Berg balance test to be assessed, with goal to be written as appropriate.    Time  9    Period  Weeks    Status  New    Target Date  03/26/18             Plan - 01/22/18 1236    Clinical Impression Statement  Pt is a 77 year old male with history of Parkinson's disease since 2012, who presents to [facility] following triceps tear (January 2019), SNF rehab and [facility] rehab, with overall difficulty with gait and mobility.  He presents with decreased funcitonal strength, bradykinesia, rigidity, abnormal posture, decreased timing and coordination with gait, decreased independence with gait, decreased balance, festination/freezing episodes with gait.  Pt able to briefly respond to cues for large amplitude movement  patterns.  Pt is at fall risk per TUG and gait velocity scores.  Pt will beneft from skilled PT to address above stated deficits to decrease fall risk and improve functional mobility/decrease caregiver burden.    History and Personal Factors relevant to plan of care:  PMH>3 co-morbidities, >3 systems involved; 1 fall     Clinical Presentation  Evolving    Clinical Presentation due to:  fall risk per TUG and gait velocity, freezing of gait, fall with hospitalization, rehab and HHPT    Clinical Decision Making  Moderate    Rehab Potential  Good    Clinical Impairments Affecting Rehab Potential  supportive wife; cognition/severity of PD deficits    PT Frequency  2x / week    PT Duration  Other (comment)   9 weeks   PT Treatment/Interventions  ADLs/Self Care Home Management;DME Instruction;Gait training;Stair training;Functional mobility training;Therapeutic activities;Therapeutic exercise;Balance training;Patient/family education;Neuromuscular re-education    PT Next Visit Plan  Follow-up on and continue to address transfer training, techniques to reduce freezing with gait; initiate HEP for PWR! Moves in sitting, standing; gait training    Consulted and Agree with Plan of Care  Patient;Family member/caregiver    Family Member Consulted  wife, [name]       Patient will benefit from skilled therapeutic intervention in order to improve the following deficits and impairments:  Abnormal gait, Decreased balance, Decreased coordination, Decreased mobility, Decreased safety awareness, Difficulty walking, Decreased strength, Impaired flexibility, Postural dysfunction  Visit Diagnosis: Other abnormalities of gait and mobility  Unsteadiness on feet  Other symptoms and signs involving the nervous system  Muscle weakness (generalized)  Abnormal posture     Problem List Patient Active Problem List   Diagnosis Date Noted  . Rupture of right triceps tendon 05/20/2017  . Triceps tendon rupture,  right, initial encounter 05/20/2017  . Atrial fibrillation ([place]) [I48.91] 02/05/2017  . Urinary retention 12/31/2015  . Acute renal failure (ARF) ([place]) 12/31/2015  . Acute encephalopathy 12/31/2015  .  Primary osteoarthritis of right knee 07/12/2015  . Primary osteoarthritis of left knee 11/30/2014  . Encounter for therapeutic drug monitoring 06/21/2013  . Edema of extremities 02/04/2013  . Chronic anticoagulation 02/04/2013  . GERD (gastroesophageal reflux disease)   . Restrictive lung disease   . CKD (chronic kidney disease) stage 2, GFR 60-89 ml/min   . Cellulitis   . Allergic rhinitis   . Anxiety   . Long term (current) use of anticoagulants 01/25/2013  . Parkinsonism ([place]) 06/22/2012  . Paresthesias 06/22/2012  . PULMONARY NODULE 06/02/2008  . SNORING 06/02/2008  . Hyperlipidemia 05/15/2008  . Essential hypertension 05/15/2008  . PULMONARY FUNCTION TESTS, ABNORMAL 05/15/2008    Tayo Maute W. 01/22/2018, 12:51 PM  Frazier Butt., PT   [facility] [address] [place], [place], [address] Phone: [phone]   Fax:  [phone]  Name: Brian [name] MRN: [id number] Date of Birth: 04/21/1940

## 2018-01-22 NOTE — Patient Instructions (Signed)
Tips to reduce freezing episodes with standing or walking:  1. Stand tall with your feet wide, so that you can rock and weight shift through your hips. 2. Don't try to fight the freeze: if you begin taking slower, faster, smaller steps, STOP, get your posture tall, and RESET your posture and balance.  Take a deep breath before taking the BIG step to start again. 3. March in place, with high knee stepping, to get started walking again.-FOR TURNS 4. Use auditory cues:  Count out loud, think of a familiar tune or song or cadence, use pocket metronome, to use rhythm to get started walking again. 5. Use visual cues:  Use a line to step over, use laser pointer line to step over, (using BIG steps) to start walking again. 6. Use visual targets to keep your posture tall (look ahead and focus on an object or target at eye level). 7. As you approach where your destination with walking, count your steps out loud and/or focus on your target with your eyes until you are fully there. 8. Use appropriate assistive device, as advised by your physical therapist to assist with taking longer, consistent steps.     Sit to Stand Transfers:  9. Scoot out to the edge of the chair 10. Place your feet flat on the floor, shoulder width apart.  Make sure your feet are tucked just under your knees. 11. Lean forward (nose over toes) with momentum, and stand up tall with your best posture.  If you need to use your arms, use them as a quick boost up to stand. 12. If you are in a low or soft chair, you can lean back and then forward up to stand, in order to get more momentum. 13. Once you are standing, make sure you are looking ahead and standing tall.  To sit down:  1. Back up until you feel the chair behind your legs. 2. Bend at you hips, reaching  Back for you chair, if needed, then slowly squat to sit down on your chair.

## 2018-01-22 NOTE — Addendum Note (Signed)
Addended by: Theone Murdoch B on: 01/22/2018 04:02 PM   Modules accepted: Orders

## 2018-01-22 NOTE — Therapy (Signed)
Home [address] [place], [place], [address] Phone: [phone]   Fax:  [phone]  Physical Therapy Treatment  Patient Details  Name: Brian [name] MRN: [id number] Date of Birth: 1940/03/26 Referring Provider (PT): Rexene Alberts   Encounter Date: 01/22/2018  PT End of Session - 01/22/18 1343    Visit Number  2    Number of Visits  18    Date for PT Re-Evaluation  04/21/18    Authorization Type  Medicare and [place] Time  0935    PT Stop Time  1017    PT Time Calculation (min)  42 min    Activity Tolerance  Patient tolerated treatment well    Behavior During Therapy  [facility] for tasks assessed/performed;Flat affect       Past Medical History:  Diagnosis Date  . Abnormal PFT 1. 05/18/08  2. 11/30/08   1. Showed mild airflow obstruction, mild restriction, mild diffusion defect; FEV1 2.22(64%), FVC 3.33(65%), FEVi% 67, TLC 5.19(69%), DLCO 77%, +BD  2. FEV1 2.38(73%), FVC 3.81(80%), FEV1% 63, TLC 5.61(80%), DLCO 79%, no BD  . Allergic rhinitis   . Anxiety   . BPH (benign prostatic hyperplasia)   . Cellulitis    right leg MRSA  . Erectile dysfunction   . GERD (gastroesophageal reflux disease)   . Heart murmur    mild MR by echo  . Hyperlipidemia   . Hypertension   . Osteoarthritis   . Paresthesias 06/22/2012  . Parkinsonism ([place]) 06/22/2012  . Permanent atrial fibrillation   . Restrictive lung disease     Past Surgical History:  Procedure Laterality Date  . CARDIAC CATHETERIZATION  11/2009   normal coronary arteries  . CARDIOVERSION     multiple  . CATARACT EXTRACTION    . COLONOSCOPY    . COLONOSCOPY WITH PROPOFOL N/A 09/02/2016   Procedure: COLONOSCOPY WITH PROPOFOL;  Surgeon: Garlan Fair, MD;  Location: [facility];  Service: Endoscopy;  Laterality: N/A;  . EYE SURGERY     bilateral cataract extraction  . JOINT REPLACEMENT    . TOTAL KNEE ARTHROPLASTY Left 11/30/2014   Procedure: TOTAL  KNEE ARTHROPLASTY;  Surgeon: Susa Day, MD;  Location: [facility];  Service: Orthopedics;  Laterality: Left;  . TOTAL KNEE ARTHROPLASTY Right 07/12/2015   Procedure: RIGHT TOTAL KNEE ARTHROPLASTY;  Surgeon: Susa Day, MD;  Location: [facility];  Service: Orthopedics;  Laterality: Right;  . TRICEPS TENDON REPAIR Right 05/20/2017  . TRICEPS TENDON REPAIR Right 05/20/2017   Procedure: TRICEPS TENDON REPAIR;  Surgeon: Iran Planas, MD;  Location: [facility];  Service: Orthopedics;  Laterality: Right;    There were no vitals filed for this visit.  Subjective Assessment - 01/22/18 1332    Subjective  No changes since last visit (eval yesterday).    Patient is accompained by:  Family member   Wife, [name]   Pertinent History  Fall February 2019 with tricep tendon tear, s/p 3 months [facility] and then [facility] therapies, L TKR 9/216, R TKR 06/2015    Patient Stated Goals  Pt's goal for PT is to drive car again, to help with balance.    Currently in Pain?  No/denies                       [facility] Adult PT Treatment/Exercise - 01/22/18 1333      Transfers   Transfers  Sit to Stand;Stand to Sit  Sit to Stand  4: Min guard;4: Min assist;From bed;With upper extremity assist    Sit to Stand Details  Verbal cues for sequencing;Verbal cues for technique   Cues for rocking/BIG effort to lean forward/stand upright   Sit to Stand Details (indicate cue type and reason)  Cues for optimal standing technique    Stand to Sit  4: Min guard;With upper extremity assist;To bed    Comments  Practiced multiple reps (at least 5 reps, 2 sets), with cues for increased forward lean and "boost with UEs" to stand      Ambulation/Gait   Ambulation/Gait  Yes    Ambulation/Gait Assistance  4: Min guard    Ambulation Distance (Feet)  100 Feet   x 2 then 120 ft, then 60 ft   Assistive device  Rolling walker    Gait Pattern  Decreased step length - right;Decreased step length - left;Right flexed knee in  stance;Shuffle;Festinating;Trunk flexed;Narrow base of support;Poor foot clearance - right    Ambulation Surface  Level;Indoor    Pre-Gait Activities  Discussed and practiced tips to reduce freezing with turns and festination with gait:  starting in restroom with turns at beginning of session:  marching/stomping with BIG effort to turn, cues to STOP and reset posture with BIG step to start gait again, cues for widened/ BOS and lateral weigthshift upon standing prior to initiating gait.  Cues to count steps out loud as pt approaches mat to turn and back up to sit.      Exercises   Exercises  Knee/Hip      Knee/Hip Exercises: Aerobic   Stepper  SciFit, Seated Stepper, x 6 minutes, Level 1.3, RPM 85-90 at self-selected pace, RPM>115 at increased effort.  Discussed rationale for increased intensity of exercisea nd movement patterns to break through stiffness and rigidity with PD             PT Education - 01/22/18 1342    Education Details  Transfer techniques, tips to reduce festination/freezing with gait and turns, rationale for high intensity, large amplitude movements; encouraged pt/wife to practice transfers and tips for freezing reduction throughout the day    Person(s) Educated  Patient;Spouse    Methods  Explanation;Demonstration;Verbal cues;Handout    Comprehension  Returned demonstration;Verbalized understanding;Verbal cues required       PT Short Term Goals - 01/22/18 1242      PT SHORT TERM GOAL #1   Title  Pt will perform HEP with wife's supervision for improved posture, transfers, balance, gait.  TARGET 02/26/18    Time  5    Period  Weeks    Status  New    Target Date  02/26/18      PT SHORT TERM GOAL #2   Title  Pt will perform at least 6 of 10 reps of sit<>stand with minimal UE support for improved transfer efficiency and safety.    Time  5    Period  Weeks    Status  New    Target Date  02/26/18      PT SHORT TERM GOAL #3   Title  Pt will improve TUG score to  less than or equal to 70 seconds for decreased fall risk.    Time  5    Period  Weeks    Status  New    Target Date  02/26/18      PT SHORT TERM GOAL #4   Title  Pt/wife will demonstrate understanding of tips to reduce freezing  with gait and turns.    Time  5    Period  Weeks    Status  New    Target Date  02/26/18        PT Long Term Goals - 01/22/18 1247      PT LONG TERM GOAL #1   Title  Pt/wife will verbalize understanding of fall prevention to decrease fall risk in home environment.  TARGET 03/26/2018    Time  9    Period  Weeks    Status  New    Target Date  03/26/18      PT LONG TERM GOAL #2   Title  Pt will perform at least 10 reps of sit<>stand with minimal to no UE support, modified independently for improved transfer efficiency and safety.    Time  9    Period  Weeks    Status  New    Target Date  03/26/18      PT LONG TERM GOAL #3   Title  Pt will improve TUG score to less than or equal to 50 seconds for decreased fall risk.    Time  9    Period  Weeks    Status  New    Target Date  03/26/18      PT LONG TERM GOAL #4   Title  Pt will improve gait velocity to at least 1.8 ft/sec for decreased fall risk, improved gait efficiency and safety.    Time  9    Period  Weeks    Status  New    Target Date  03/26/18      PT LONG TERM GOAL #5   Title  Berg balance test to be assessed, with goal to be written as appropriate.    Time  9    Period  Weeks    Status  New    Target Date  03/26/18            Plan - 01/22/18 1343    Clinical Impression Statement  Pt returns today and skilled PT session focused on transfer training, tips to reduce freezing with gait and turns, and rationale for larger amplitude/high intensity movement patterns.  Wife instructed in movement strategy techniques with patient at end of session, and encouraged patient to use these movement patterns each time for transfers, turns, and gait during the day for improved carryover with  strategies for improved movement.    Rehab Potential  Good    Clinical Impairments Affecting Rehab Potential  supportive wife; cognition/severity of PD deficits    PT Frequency  2x / week    PT Duration  Other (comment)   9 weeks   PT Treatment/Interventions  ADLs/Self Care Home Management;DME Instruction;Gait training;Stair training;Functional mobility training;Therapeutic activities;Therapeutic exercise;Balance training;Patient/family education;Neuromuscular re-education    PT Next Visit Plan  Review transfer traning, techniques to reduce freezing with gait; initiate PWR! Moves in sitting; try to perform Berg; gait training for increased step length and posture.    Consulted and Agree with Plan of Care  Patient;Family member/caregiver    Family Member Consulted  wife, [name] (end of session)       Patient will benefit from skilled therapeutic intervention in order to improve the following deficits and impairments:  Abnormal gait, Decreased balance, Decreased coordination, Decreased mobility, Decreased safety awareness, Difficulty walking, Decreased strength, Impaired flexibility, Postural dysfunction  Visit Diagnosis: Other abnormalities of gait and mobility  Other symptoms and signs involving the nervous system  Unsteadiness on  feet  Abnormal posture     Problem List Patient Active Problem List   Diagnosis Date Noted  . Rupture of right triceps tendon 05/20/2017  . Triceps tendon rupture, right, initial encounter 05/20/2017  . Atrial fibrillation ([place]) [I48.91] 02/05/2017  . Urinary retention 12/31/2015  . Acute renal failure (ARF) ([place]) 12/31/2015  . Acute encephalopathy 12/31/2015  . Primary osteoarthritis of right knee 07/12/2015  . Primary osteoarthritis of left knee 11/30/2014  . Encounter for therapeutic drug monitoring 06/21/2013  . Edema of extremities 02/04/2013  . Chronic anticoagulation 02/04/2013  . GERD (gastroesophageal reflux disease)   . Restrictive lung  disease   . CKD (chronic kidney disease) stage 2, GFR 60-89 ml/min   . Cellulitis   . Allergic rhinitis   . Anxiety   . Long term (current) use of anticoagulants 01/25/2013  . Parkinsonism ([place]) 06/22/2012  . Paresthesias 06/22/2012  . PULMONARY NODULE 06/02/2008  . SNORING 06/02/2008  . Hyperlipidemia 05/15/2008  . Essential hypertension 05/15/2008  . PULMONARY FUNCTION TESTS, ABNORMAL 05/15/2008    Arie Powell W. 01/22/2018, 1:47 PM  Frazier Butt., PT  [facility] [address] [place], [place], [address] Phone: [phone]   Fax:  [phone]  Name: BLANCHARD WILLHITE MRN: [id number] Date of Birth: 1940/04/01

## 2018-01-22 NOTE — Therapy (Addendum)
[facility] [address] [place], [place], [address] Phone: [phone]   Fax:  [phone]  Occupational Therapy Evaluation  Patient Details  Name: Brian [name] MRN: [id number] Date of Birth: 1940/11/08 Referring Provider (OT): Dr. Rexene Alberts   Encounter Date: 01/22/2018  OT End of Session - 01/22/18 1311    Visit Number  1    Number of Visits  25    Date for OT Re-Evaluation  04/22/18    Authorization Type  Medicare    Authorization Time Period  cert 12 weeks, anticipate d/c following 8 weeks    Authorization - Visit Number  1    Authorization - Number of Visits  10    OT Start Time  0805    OT Stop Time  0845    OT Time Calculation (min)  40 min       Past Medical History:  Diagnosis Date  . Abnormal PFT 1. 05/18/08  2. 11/30/08   1. Showed mild airflow obstruction, mild restriction, mild diffusion defect; FEV1 2.22(64%), FVC 3.33(65%), FEVi% 67, TLC 5.19(69%), DLCO 77%, +BD  2. FEV1 2.38(73%), FVC 3.81(80%), FEV1% 63, TLC 5.61(80%), DLCO 79%, no BD  . Allergic rhinitis   . Anxiety   . BPH (benign prostatic hyperplasia)   . Cellulitis    right leg MRSA  . Erectile dysfunction   . GERD (gastroesophageal reflux disease)   . Heart murmur    mild MR by echo  . Hyperlipidemia   . Hypertension   . Osteoarthritis   . Paresthesias 06/22/2012  . Parkinsonism ([place]) 06/22/2012  . Permanent atrial fibrillation   . Restrictive lung disease     Past Surgical History:  Procedure Laterality Date  . CARDIAC CATHETERIZATION  11/2009   normal coronary arteries  . CARDIOVERSION     multiple  . CATARACT EXTRACTION    . COLONOSCOPY    . COLONOSCOPY WITH PROPOFOL N/A 09/02/2016   Procedure: COLONOSCOPY WITH PROPOFOL;  Surgeon: Garlan Fair, MD;  Location: [facility];  Service: Endoscopy;  Laterality: N/A;  . EYE SURGERY     bilateral cataract extraction  . JOINT REPLACEMENT    . TOTAL KNEE ARTHROPLASTY Left 11/30/2014    Procedure: TOTAL KNEE ARTHROPLASTY;  Surgeon: Susa Day, MD;  Location: [facility];  Service: Orthopedics;  Laterality: Left;  . TOTAL KNEE ARTHROPLASTY Right 07/12/2015   Procedure: RIGHT TOTAL KNEE ARTHROPLASTY;  Surgeon: Susa Day, MD;  Location: [facility];  Service: Orthopedics;  Laterality: Right;  . TRICEPS TENDON REPAIR Right 05/20/2017  . TRICEPS TENDON REPAIR Right 05/20/2017   Procedure: TRICEPS TENDON REPAIR;  Surgeon: Iran Planas, MD;  Location: [facility];  Service: Orthopedics;  Laterality: Right;    There were no vitals filed for this visit.  Subjective Assessment - 01/22/18 0811    Subjective   Pt s/p triceps tear in February, pt was I with dressing prior to this    Patient Stated Goals  to be as independent as he can, to be able dress himself    Currently in Pain?  No/denies        [facility] OT Assessment - 01/22/18 1300      Assessment   Medical Diagnosis  Parkinson's disease    Referring Provider (OT)  Dr. Rexene Alberts    Onset Date/Surgical Date  12/21/17      Precautions   Precautions  Fall    Precaution Comments  no precautions related to triceps tear  Balance Screen   Has the patient fallen in the past 6 months  Yes    How many times?  4    Has the patient had a decrease in activity level because of a fear of falling?   Yes    Is the patient reluctant to leave their home because of a fear of falling?   No      Home  Environment   Family/patient expects to be discharged to:  Private residence    Living Arrangements  Spouse/significant other   cousin provides assistance as well   Type of [place]  Two level    Lives With  Spouse   cousin     Prior Function   Level of Independence  Independent    Leisure  Prior to [employer], pt was independently working out at gym; has tried Dietitian, Clinical research associate at [employer]   Eating/Feeding  Needs assist with cutting food    Eating/Feeding Patient Percentage  --   increased spills    Grooming  Modified independent    Upper Body Bathing  Minimal assistance    Lower Body Bathing  Minimal assistance    Upper Body Dressing  Set up    Lower Body Dressing  Maximal assistance   to total A   Toilet Transfer  Modified independent;Supervision/safety    Tub/Shower Transfer  Minimal assistance   walkin shower with seat     IADL   Light Housekeeping  Does not participate in any housekeeping tasks    Meal Prep  Needs to have meals prepared and served    Medication Management  Has difficulty remembering to take medication    Financial Management  Dependent      Mobility   Mobility Status  History of falls;Freezing   supervision with walker     Written Expression   Handwriting  Severe micrographia      Vision - History   Additional Comments  hallucinations at times       Cognition   Overall Cognitive Status  Impaired/Different from baseline    Area of Impairment  Memory    Memory  Impaired    Memory Impairment  Decreased short term memory    Problem Solving  Impaired    Behaviors  --   delayed processing     Observation/Other Assessments   Other Surveys   Select    Physical Performance Test    Yes    Simulated Eating Time (seconds)  17.50 secs    Donning Doffing Jacket Comments  3 button/ unbutton 1 min 49 secs      Posture/Postural Control   Posture/Postural Control  Postural limitations    Postural Limitations  Rounded Shoulders;Forward head;Posterior pelvic tilt;Flexed trunk      Coordination   Gross Motor Movements are Fluid and Coordinated  No    Fine Motor Movements are Fluid and Coordinated  No    9 Hole Peg Test  Right;Left    Right 9 Hole Peg Test  68.66 secs    Left 9 Hole Peg Test  68.82secs    Box and Blocks  RUE 26 blocks, LUE 24 blocks      AROM   Overall AROM   Deficits    Overall AROM Comments  RUE shoulder flexion 105, elbow extension -45, 90% supination, wrist ext 75%, finger ext 90%, LUE shoulder flexion 110., elbow extension -  10,  dsupination 80%, finger extension 95%, finger flexion 90%                        OT Short Term Goals - 01/22/18 1242      OT SHORT TERM GOAL #1   Title  Pt/ caregeiver willl be I with PD specific HEP    Time  4    Period  Weeks    Status  New    Target Date  02/21/18      OT SHORT TERM GOAL #2   Title  Pt /wife will verbalize understanding of adapted strategies for ADLS/ IADLs to maximize safety and independence.    Time  4    Period  Weeks    Status  New      OT SHORT TERM GOAL #3   Title  Pt will perfom LB dressing with min A    Baseline  max A    Time  4    Period  Weeks    Status  New      OT SHORT TERM GOAL #4   Title  Pt will demonstrate improved fine motor coordination as evidenced by decreasing 9 hole pegs test by 4 secs bilaterally.    Baseline  RUE 68.66 secs, LUE 68.82 secs    Time  4    Period  Weeks    Status  New      OT SHORT TERM GOAL #5   Title  Pt/ wife will verbalize understanding of compensatory strategies for memory/ cognition    Time  4    Period  Weeks    Status  New        OT Long Term Goals - 01/22/18 1246      OT LONG TERM GOAL #1   Title  Pt and wife will verbalize understanding of ways to prevent future complications and appropriate community resources    Time  12    Period  Weeks    Status  New    Target Date  04/22/18      OT LONG TERM GOAL #2   Title  Pt will demonstrate aiblity to retrieve a lightweight object at 115 shoulder flexion and -35 elbow extension with RUE.    Time  12    Period  Weeks    Status  New      OT LONG TERM GOAL #3   Title  Pt.will demonstrate ability to retrieve a lightweight obect at 120 shoulder flexion with LUE.    Time  12    Period  Weeks    Status  New      OT LONG TERM GOAL #4   Title  Pt will demonstrate improved gross motor coordination/ bilateral UE functional use for ADLs as evidenced by increasing box/ blocks score bilaterally by 3 blocks    Baseline  RUE 26 blocks, LUE  24 blocks     Time  12    Period  Weeks    Status  New      OT LONG TERM GOAL #5   Title  Pt will demonstrate ability to fasten 3 buttons in 75 secs or less    Time  12    Period  Weeks    Status  New            Plan - 01/22/18 1235    Clinical Impression Statement  Pt is a 77 y.o. with hx of Parkinson's disease(original  diagnosis approx 2012) and recent hx of right triceps tear, who presents to occupational therapy with a decline in ADLs and the following deficits: decreased coordination, rigidity, decreased ROM, decreased balance, bradykinesia, cognitive deficits which impede performance of ADLs/IADLS.Pt can benefit from skilled occupational therapy to maximize pt safety and independence with daily activities and to maintain quality of life.    Occupational Profile and client history currently impacting functional performance  Pt was independent with ADLS prior to his triceps tear in February, now he requires assistance with ADLS and has had several falls.FTD:DUKGURKYH'C disease(diagnosed with parkinsonism 2012)triceps tear Feb 2019,R TKR 2016, L TKR 2017, anxiety, arthritis    Occupational performance deficits (Please refer to evaluation for details):  IADL's;ADL's;Leisure;Social Participation    Rehab Potential  Fair    Current Impairments/barriers affecting progress:  length of time since onset, severity of deficits, cognitive deficits    OT Frequency  2x / week   plus eval   OT Duration  12 weeks   anticipate d/c after 8 weeks   OT Treatment/Interventions  Self-care/ADL training;Therapeutic exercise;Gait Training;Aquatic Therapy;Moist Heat;Paraffin;Neuromuscular education;Balance training;Patient/family education;Therapeutic activities;Functional Mobility Training;Fluidtherapy;Cryotherapy;Ultrasound;Contrast Bath;DME and/or AE instruction;Manual Therapy;Passive range of motion;Cognitive remediation/compensation    Plan  initiate HEP functional tasks, vs. PWR!(due to significant  cognitive deficits) adapted strategies for ADLS.    Clinical Decision Making  Several treatment options, min-mod task modification necessary    Consulted and Agree with Plan of Care  Patient;Family member/caregiver    Family Member Consulted  wife       Patient will benefit from skilled therapeutic intervention in order to improve the following deficits and impairments:  Abnormal gait, Decreased cognition, Impaired flexibility, Pain, Decreased coordination, Decreased mobility, Decreased strength, Decreased range of motion, Decreased activity tolerance, Decreased balance, Decreased endurance, Decreased knowledge of precautions, Decreased safety awareness, Difficulty walking, Impaired perceived functional ability, Impaired UE functional use  Visit Diagnosis: Muscle weakness (generalized)  Other symptoms and signs involving the nervous system  Other abnormalities of gait and mobility  Unsteadiness on feet  Abnormal posture  Other lack of coordination  Frontal lobe and executive function deficit    Problem List Patient Active Problem List   Diagnosis Date Noted  . Rupture of right triceps tendon 05/20/2017  . Triceps tendon rupture, right, initial encounter 05/20/2017  . Atrial fibrillation ([place]) [I48.91] 02/05/2017  . Urinary retention 12/31/2015  . Acute renal failure (ARF) ([place]) 12/31/2015  . Acute encephalopathy 12/31/2015  . Primary osteoarthritis of right knee 07/12/2015  . Primary osteoarthritis of left knee 11/30/2014  . Encounter for therapeutic drug monitoring 06/21/2013  . Edema of extremities 02/04/2013  . Chronic anticoagulation 02/04/2013  . GERD (gastroesophageal reflux disease)   . Restrictive lung disease   . CKD (chronic kidney disease) stage 2, GFR 60-89 ml/min   . Cellulitis   . Allergic rhinitis   . Anxiety   . Long term (current) use of anticoagulants 01/25/2013  . Parkinsonism ([place]) 06/22/2012  . Paresthesias 06/22/2012  . PULMONARY NODULE  06/02/2008  . SNORING 06/02/2008  . Hyperlipidemia 05/15/2008  . Essential hypertension 05/15/2008  . PULMONARY FUNCTION TESTS, ABNORMAL 05/15/2008    RINE,KATHRYN 01/22/2018, 3:55 PM Theone Murdoch, OTR/L [phone] Phone: [phone] 3:57 PM 01/22/18 [facility] [address] [place], [place], [address] Phone: [phone]   Fax:  [phone]  Name: CANDACE RAMUS MRN: [id number] Date of Birth: 16-Aug-1940

## 2018-01-25 ENCOUNTER — Ambulatory Visit: Payer: Medicare Other | Admitting: Speech Pathology

## 2018-01-25 ENCOUNTER — Ambulatory Visit: Payer: Medicare Other | Admitting: Physical Therapy

## 2018-01-25 ENCOUNTER — Ambulatory Visit: Payer: Medicare Other | Admitting: Occupational Therapy

## 2018-01-25 ENCOUNTER — Encounter: Payer: Self-pay | Admitting: Physical Therapy

## 2018-01-25 ENCOUNTER — Encounter: Payer: Self-pay | Admitting: Occupational Therapy

## 2018-01-25 DIAGNOSIS — R278 Other lack of coordination: Secondary | ICD-10-CM

## 2018-01-25 DIAGNOSIS — M6281 Muscle weakness (generalized): Secondary | ICD-10-CM | POA: Diagnosis not present

## 2018-01-25 DIAGNOSIS — R2689 Other abnormalities of gait and mobility: Secondary | ICD-10-CM

## 2018-01-25 DIAGNOSIS — R2681 Unsteadiness on feet: Secondary | ICD-10-CM

## 2018-01-25 DIAGNOSIS — R471 Dysarthria and anarthria: Secondary | ICD-10-CM

## 2018-01-25 DIAGNOSIS — R293 Abnormal posture: Secondary | ICD-10-CM

## 2018-01-25 DIAGNOSIS — R41844 Frontal lobe and executive function deficit: Secondary | ICD-10-CM

## 2018-01-25 DIAGNOSIS — R29818 Other symptoms and signs involving the nervous system: Secondary | ICD-10-CM | POA: Diagnosis not present

## 2018-01-25 NOTE — Therapy (Signed)
[facility] [address] [place], [place], [address] Phone: [phone]   Fax:  [phone]  Occupational Therapy Treatment  Patient Details  Name: Brian [name] MRN: [id number] Date of Birth: [birth year] Referring Provider (OT): Dr. Rexene Alberts   Encounter Date: 01/25/2018  OT End of Session - 01/25/18 0902    Visit Number  2    Number of Visits  25    Date for OT Re-Evaluation  04/22/18    Authorization Type  Medicare    Authorization Time Period  cert 12 weeks, anticipate d/c following 8 weeks    Authorization - Visit Number  2    Authorization - Number of Visits  10    OT Start Time  [phone]    OT Stop Time  0915    OT Time Calculation (min)  40 min    Activity Tolerance  Patient tolerated treatment well    Behavior During Therapy  [facility] for tasks assessed/performed;Flat affect       Past Medical History:  Diagnosis Date  . Abnormal PFT 1. 05/18/08  2. 11/30/08   1. Showed mild airflow obstruction, mild restriction, mild diffusion defect; FEV1 2.22(64%), FVC 3.33(65%), FEVi% 67, TLC 5.19(69%), DLCO 77%, +BD  2. FEV1 2.38(73%), FVC 3.81(80%), FEV1% 63, TLC 5.61(80%), DLCO 79%, no BD  . Allergic rhinitis   . Anxiety   . BPH (benign prostatic hyperplasia)   . Cellulitis    right leg MRSA  . Erectile dysfunction   . GERD (gastroesophageal reflux disease)   . Heart murmur    mild MR by echo  . Hyperlipidemia   . Hypertension   . Osteoarthritis   . Paresthesias 06/22/2012  . Parkinsonism ([place]) 06/22/2012  . Permanent atrial fibrillation   . Restrictive lung disease     Past Surgical History:  Procedure Laterality Date  . CARDIAC CATHETERIZATION  11/2009   normal coronary arteries  . CARDIOVERSION     multiple  . CATARACT EXTRACTION    . COLONOSCOPY    . COLONOSCOPY WITH PROPOFOL N/A 09/02/2016   Procedure: COLONOSCOPY WITH PROPOFOL;  Surgeon: Garlan Fair, MD;  [facility];  Service: Endoscopy;   Laterality: N/A;  . EYE SURGERY     bilateral cataract extraction  . JOINT REPLACEMENT    . TOTAL KNEE ARTHROPLASTY Left 11/30/2014   Procedure: TOTAL KNEE ARTHROPLASTY;  Surgeon: Susa Day, MD;  [facility];  Service: Orthopedics;  Laterality: Left;  . TOTAL KNEE ARTHROPLASTY Right 07/12/2015   Procedure: RIGHT TOTAL KNEE ARTHROPLASTY;  Surgeon: Susa Day, MD;  [facility];  Service: Orthopedics;  Laterality: Right;  . TRICEPS TENDON REPAIR Right 05/20/2017  . TRICEPS TENDON REPAIR Right 05/20/2017   Procedure: TRICEPS TENDON REPAIR;  Surgeon: Iran Planas, MD;  Location: [facility];  Service: Orthopedics;  Laterality: Right;    There were no vitals filed for this visit.  Subjective Assessment - 01/25/18 0945    Subjective   Pt reports that he was able to do a few buttons this morning, but not all of them    Patient Stated Goals  to be as independent as he can, to be able dress himself    Currently in Pain?  No/denies             OT Education - 01/25/18 0900    Education Details  Basic oordination HEP (+ PWR! hands) with focus on large amplitude--see pt instructions.  Importance of large amplitude movements.  Person(s) Educated  Patient    Methods  Explanation;Demonstration;Verbal cues;Handout    Comprehension  Verbalized understanding;Returned demonstration;Verbal cues required;Need further instruction       OT Short Term Goals - 01/22/18 1242      OT SHORT TERM GOAL #1   Title  Pt/ caregeiver willl be I with PD specific HEP    Time  4    Period  Weeks    Status  New    Target Date  02/21/18      OT SHORT TERM GOAL #2   Title  Pt /wife will verbalize understanding of adapted strategies for ADLS/ IADLs to maximize safety and independence.    Time  4    Period  Weeks    Status  New      OT SHORT TERM GOAL #3   Title  Pt will perfom LB dressing with min A    Baseline  max A    Time  4    Period  Weeks    Status  New      OT SHORT TERM GOAL #4    Title  Pt will demonstrate improved fine motor coordination as evidenced by decreasing 9 hole pegs test by 4 secs bilaterally.    Baseline  RUE 68.66 secs, LUE 68.82 secs    Time  4    Period  Weeks    Status  New      OT SHORT TERM GOAL #5   Title  Pt/ wife will verbalize understanding of compensatory strategies for memory/ cognition    Time  4    Period  Weeks    Status  New        OT Long Term Goals - 01/22/18 1246      OT LONG TERM GOAL #1   Title  Pt and wife will verbalize understanding of ways to prevent future complications and appropriate community resources    Time  12    Period  Weeks    Status  New    Target Date  04/22/18      OT LONG TERM GOAL #2   Title  Pt will demonstrate aiblity to retrieve a lightweight object at 115 shoulder flexion and -35 elbow extension with RUE.    Time  12    Period  Weeks    Status  New      OT LONG TERM GOAL #3   Title  Pt.will demonstrate ability to retrieve a lightweight obect at 120 shoulder flexion with LUE.    Time  12    Period  Weeks    Status  New      OT LONG TERM GOAL #4   Title  Pt will demonstrate improved gross motor coordination/ bilateral UE functional use for ADLs as evidenced by increasing box/ blocks score bilaterally by 3 blocks    Baseline  RUE 26 blocks, LUe 24 blocks     Time  12    Period  Weeks    Status  New      OT LONG TERM GOAL #5   Title  Pt will demonstrae ability to fasten 3 buttons in 75 secs or less    Time  12    Period  Weeks    Status  New            Plan - 01/25/18 0903    Clinical Impression Statement  Pt was able to incr movement amplitude with cueing today.  However, pt will  need reinforcement and cueing of HEP for carryover due to cognitive deficits.    Occupational Profile and client history currently impacting functional performance  Pt was independent with ADLS prior to his triceps tear in February, now he requires assistance with ADLS and has had several  falls.PTW:SFKCLEXNT'Z disease(diagnosed with parkinsonism 2012)triceps tear Feb 2019,R TKR 2016, L TKR 2017, anxiety, arthritis    Occupational performance deficits (Please refer to evaluation for details):  IADL's;ADL's;Leisure;Social Participation    Rehab Potential  Fair    Current Impairments/barriers affecting progress:  length of time since onset, severity of deficits, cognitive deficits    OT Frequency  2x / week   plus eval   OT Duration  12 weeks   anticipate d/c after 8 weeks   OT Treatment/Interventions  Self-care/ADL training;Therapeutic exercise;Gait Training;Aquatic Therapy;Moist Heat;Paraffin;Neuromuscular education;Balance training;Patient/family education;Therapeutic activities;Functional Mobility Training;Fluidtherapy;Cryotherapy;Ultrasound;Contrast Bath;DME and/or AE instruction;Manual Therapy;Passive range of motion;Cognitive remediation/compensation    Plan  continue with HEP (functional tasks (big reaching/cane ex) vs. sitting/supine PWR!)    Clinical Decision Making  Several treatment options, min-mod task modification necessary    OT Home Exercise Plan  Education provided:  Basic PD Coordination HEP    Consulted and Agree with Plan of Care  Patient;Family member/caregiver    Family Member Consulted  wife       Patient will benefit from skilled therapeutic intervention in order to improve the following deficits and impairments:  Abnormal gait, Decreased cognition, Impaired flexibility, Pain, Decreased coordination, Decreased mobility, Decreased strength, Decreased range of motion, Decreased activity tolerance, Decreased balance, Decreased endurance, Decreased knowledge of precautions, Decreased safety awareness, Difficulty walking, Impaired perceived functional ability, Impaired UE functional use  Visit Diagnosis: Other symptoms and signs involving the nervous system  Unsteadiness on feet  Abnormal posture  Muscle weakness (generalized)  Other lack of  coordination  Frontal lobe and executive function deficit  Other abnormalities of gait and mobility    Problem List Patient Active Problem List   Diagnosis Date Noted  . Rupture of right triceps tendon 05/20/2017  . Triceps tendon rupture, right, initial encounter 05/20/2017  . Atrial fibrillation ([place]) [I48.91] 02/05/2017  . Urinary retention 12/31/2015  . Acute renal failure (ARF) ([place]) 12/31/2015  . Acute encephalopathy 12/31/2015  . Primary osteoarthritis of right knee 07/12/2015  . Primary osteoarthritis of left knee 11/30/2014  . Encounter for therapeutic drug monitoring 06/21/2013  . Edema of extremities 02/04/2013  . Chronic anticoagulation 02/04/2013  . GERD (gastroesophageal reflux disease)   . Restrictive lung disease   . CKD (chronic kidney disease) stage 2, GFR 60-89 ml/min   . Cellulitis   . Allergic rhinitis   . Anxiety   . Long term (current) use of anticoagulants 01/25/2013  . Parkinsonism ([place]) 06/22/2012  . Paresthesias 06/22/2012  . PULMONARY NODULE 06/02/2008  . SNORING 06/02/2008  . Hyperlipidemia 05/15/2008  . Essential hypertension 05/15/2008  . PULMONARY FUNCTION TESTS, ABNORMAL 05/15/2008    [facility] 01/25/2018, 9:47 AM  [facility] [place] [address] [place], [place], [address] Phone: [phone]   Fax:  [phone]  Name: SHAIDEN ALDOUS MRN: [id number] Date of Birth: 1941/02/27   Vianne Bulls, OTR/L [facility] [address] [place], [place]  [address] [phone] phone [phone] 01/25/18 9:47 AM

## 2018-01-25 NOTE — Therapy (Signed)
[facility] [address] [place], [place], [address] Phone: [phone]   Fax:  [phone]  Speech Language Pathology Treatment  Patient Details  Name: Brian [name] MRN: [id number] Date of Birth: 04/20/1940 Referring Provider (SLP): Star Age, MD   Encounter Date: 01/25/2018  End of Session - 01/25/18 1045    Visit Number  2    Number of Visits  17    Date for SLP Re-Evaluation  04/21/18    SLP Start Time  0932    SLP Stop Time   1013    SLP Time Calculation (min)  41 min    Activity Tolerance  Patient tolerated treatment well       Past Medical History:  Diagnosis Date  . Abnormal PFT 1. 05/18/08  2. 11/30/08   1. Showed mild airflow obstruction, mild restriction, mild diffusion defect; FEV1 2.22(64%), FVC 3.33(65%), FEVi% 67, TLC 5.19(69%), DLCO 77%, +BD  2. FEV1 2.38(73%), FVC 3.81(80%), FEV1% 63, TLC 5.61(80%), DLCO 79%, no BD  . Allergic rhinitis   . Anxiety   . BPH (benign prostatic hyperplasia)   . Cellulitis    right leg MRSA  . Erectile dysfunction   . GERD (gastroesophageal reflux disease)   . Heart murmur    mild MR by echo  . Hyperlipidemia   . Hypertension   . Osteoarthritis   . Paresthesias 06/22/2012  . Parkinsonism ([place]) 06/22/2012  . Permanent atrial fibrillation   . Restrictive lung disease     Past Surgical History:  Procedure Laterality Date  . CARDIAC CATHETERIZATION  11/2009   normal coronary arteries  . CARDIOVERSION     multiple  . CATARACT EXTRACTION    . COLONOSCOPY    . COLONOSCOPY WITH PROPOFOL N/A 09/02/2016   Procedure: COLONOSCOPY WITH PROPOFOL;  Surgeon: Garlan Fair, MD;  Location: [facility];  Service: Endoscopy;  Laterality: N/A;  . EYE SURGERY     bilateral cataract extraction  . JOINT REPLACEMENT    . TOTAL KNEE ARTHROPLASTY Left 11/30/2014   Procedure: TOTAL KNEE ARTHROPLASTY;  Surgeon: Susa Day, MD;  Location: [facility];  Service: Orthopedics;   Laterality: Left;  . TOTAL KNEE ARTHROPLASTY Right 07/12/2015   Procedure: RIGHT TOTAL KNEE ARTHROPLASTY;  Surgeon: Susa Day, MD;  Location: [facility];  Service: Orthopedics;  Laterality: Right;  . TRICEPS TENDON REPAIR Right 05/20/2017  . TRICEPS TENDON REPAIR Right 05/20/2017   Procedure: TRICEPS TENDON REPAIR;  Surgeon: Iran Planas, MD;  Location: [facility];  Service: Orthopedics;  Laterality: Right;    There were no vitals filed for this visit.  Subjective Assessment - 01/25/18 0935    Subjective  "I been scaring the people at home when I got loud."    Patient is accompained by:  --   pt alone today   Currently in Pain?  No/denies            ADULT SLP TREATMENT - 01/25/18 0932      General Information   Behavior/Cognition  Alert;Cooperative      Treatment Provided   Treatment provided  Cognitive-Linquistic      Pain Assessment   Pain Assessment  No/denies pain      Cognitive-Linquistic Treatment   Treatment focused on  Dysarthria;Voice    Skilled Treatment  Pt entered treatment room with sub-WNL conversation level, low-mid 60s. Pt reports he has been practicing loud /a/ at home daily. Loud /a/ used to recalibrate loudness and effort in speech tasks and  conversations, average 86 dB with usual mod A for effort, mouth opening. Pt did not bring glasses today, so SLP focused on vocal intensity in imitation of words, phrases, average 72 dB with occasional min A for loudness. Pt stated he was concerned about how others would perceive his "shouting." SLP used audio feedback to demonstrate sub WNL vocal intensity with pt's normal effort, and normal vocal intensity with increased effort. Subsequently in spontaneous speech tasks (word/phrase level), pt vocal intensity averaged 73 dB with occasional min A. SLP provided tasks for home practice, instructing pt to read aloud after loud /a/ for 30 minutes, twice per day.       Assessment / Recommendations / Plan   Plan  Continue with current  plan of care      Progression Toward Goals   Progression toward goals  Progressing toward goals       SLP Education - 01/25/18 1045    Education Details  loud /a/, must feel like shouting to be loud enough    Person(s) Educated  Patient    Methods  Explanation;Demonstration;Verbal cues    Comprehension  Verbalized understanding;Returned demonstration;Verbal cues required;Need further instruction       SLP Short Term Goals - 01/25/18 1047      SLP SHORT TERM GOAL #1   Title  Pt will generate loud /a/ with average of upper 80s dB over 3 sessions    Time  4    Period  Weeks    Status  On-going      SLP SHORT TERM GOAL #2   Title  Pt will use abdominal breathing at rest 85% success over 2 sessions    Time  4    Period  Weeks    Status  On-going      SLP SHORT TERM GOAL #3   Title  Pt will use speech volume average low 70sdB when responding with sentences 18/20 over two sessions    Time  4    Period  Weeks    Status  On-going      SLP SHORT TERM GOAL #4   Title  pt will tell SLP 3 anomia strategies over three sessions    Time  4    Period  Weeks    Status  On-going       SLP Long Term Goals - 01/25/18 1048      SLP LONG TERM GOAL #1   Title  pt will generate loud /a/ at upper 80s - low 90s dB over 4 sessions    Time  9    Period  Weeks   or 17 visits, for all LTGs     SLP LONG TERM GOAL #2   Title  Pt will use abdominal breathing in sentences or short conversation 80% success over 2 sessions    Time  8    Period  Weeks    Status  On-going      SLP LONG TERM GOAL #3   Title  pt will use anomia compensations in 75% of opportunities in sentence responses, with min verbal cues to use them, over 3 sessions    Time  8    Period  Weeks    Status  On-going      SLP LONG TERM GOAL #4   Title  pt will undergo objective swallow eval PRN    Time  4    Period  Weeks    Status  On-going  Plan - 01/25/18 1046    Clinical Impression Statement  Pt presents with  dysarthria including intermittent reduced speech volume (especially with longer utterances and as conversation progresses), reduced breath support, and imprecise consonants which all reduce pt's speech intelligibility. Pt stimulable for louder speech (low 70s dB) with occasional min A after loud /a/ today.  Pt may be administered the [employer] in subsequent sessions. Pt may require a modified barium swallow or FEES at some point during this therapy course. He would benefit from skilled ST addressing these skills to communicate more efficiently.     Speech Therapy Frequency  2x / week    Duration  --   8 weeks or 17 visits   Treatment/Interventions  Aspiration precaution training;Pharyngeal strengthening exercises;Diet toleration management by SLP;Language facilitation;Compensatory techniques;Internal/external aids;SLP instruction and feedback;Multimodal communcation approach;Functional tasks;Patient/family education;Compensatory strategies;Trials of upgraded texture/liquids;Cueing hierarchy;Environmental controls;Cognitive reorganization    Potential to Achieve Goals  Good       Patient will benefit from skilled therapeutic intervention in order to improve the following deficits and impairments:   Dysarthria and anarthria    Problem List Patient Active Problem List   Diagnosis Date Noted  . Rupture of right triceps tendon 05/20/2017  . Triceps tendon rupture, right, initial encounter 05/20/2017  . Atrial fibrillation ([place]) [I48.91] 02/05/2017  . Urinary retention 12/31/2015  . Acute renal failure (ARF) ([place]) 12/31/2015  . Acute encephalopathy 12/31/2015  . Primary osteoarthritis of right knee 07/12/2015  . Primary osteoarthritis of left knee 11/30/2014  . Encounter for therapeutic drug monitoring 06/21/2013  . Edema of extremities 02/04/2013  . Chronic anticoagulation 02/04/2013  . GERD (gastroesophageal reflux disease)   . Restrictive lung disease   . CKD (chronic kidney  disease) stage 2, GFR 60-89 ml/min   . Cellulitis   . Allergic rhinitis   . Anxiety   . Long term (current) use of anticoagulants 01/25/2013  . Parkinsonism ([place]) 06/22/2012  . Paresthesias 06/22/2012  . PULMONARY NODULE 06/02/2008  . SNORING 06/02/2008  . Hyperlipidemia 05/15/2008  . Essential hypertension 05/15/2008  . PULMONARY FUNCTION TESTS, ABNORMAL 05/15/2008   Deneise Lever, [place], [place] Speech-Language Pathologist  Aliene Altes 01/25/2018, 10:49 AM  [facility] [address] [place], [place], [address] Phone: [phone]   Fax:  [phone]   Name: Brian [name] MRN: [id number] Date of Birth: 05-Aug-1940

## 2018-01-25 NOTE — Patient Instructions (Addendum)
Coordination Exercises  Perform the following exercises for 20 minutes 1 times per day. Perform with both hand(s). Perform using big movements.   Flipping Cards: Place deck of cards on the table. Flip cards over by opening your hand big to grasp and then turn your palm up big, opening hand fully to release.  Deal cards: Hold 1/2 or whole deck in your hand. Use thumb to push card off top of deck with one big push.  Pick up coins and place in coin bank or container: Open hand big before picking up with big, intentional movements. Do not drag coin to the edge.  Pick up coins and stack one at a time: Open hand big and pick up with big, intentional movements. Do not drag coin to the edge. (5-10 in a stack)  Fasten nuts/bolts or put on bottle caps: Turn as much/as big as you can with each turn.  Move wrist and open hand between each turn.    PWR! Hands:   Start with elbows bent and hands closedPush hands out BIG. Elbows straight, wrists up, fingers open and spread apart BIG.  Make each movement big and deliberate so that you feel the movement.  Perform at least 10 repetitions 1x/day, but perform PWR! Hands throughout the day when you are having trouble using your hands (picking up/manipulating small objects, writing, eating, typing, sewing, buttoning, etc.).

## 2018-01-25 NOTE — Patient Instructions (Signed)
Tips to reduce freezing episodes with standing or walking:  1. Stand tall with your feet wide, so that you can rock and weight shift through your hips. 2. Don't try to fight the freeze: if you begin taking slower, faster, smaller steps, STOP, get your posture tall, and RESET your posture and balance.  Take a deep breath before taking the BIG step to start again. 3. March in place, with high knee stepping, to get started walking again.-FOR TURNS 4. Use auditory cues:  Count out loud, think of a familiar tune or song or cadence, use pocket metronome, to use rhythm to get started walking again. 5. Use visual cues:  Use a line to step over, use laser pointer line to step over, (using BIG steps) to start walking again. 6. Use visual targets to keep your posture tall (look ahead and focus on an object or target at eye level). 7. As you approach where your destination with walking, count your steps out loud and/or focus on your target with your eyes until you are fully there. 8. Use appropriate assistive device, as advised by your physical therapist to assist with taking longer, consistent steps.     Sit to Stand Transfers:  9. Scoot out to the edge of the chair 10. Place your feet flat on the floor, shoulder width apart.  Make sure your feet are tucked just under your knees. 11. Lean forward (nose over toes) with momentum, and stand up tall with your best posture.  If you need to use your arms, use them as a quick boost up to stand. 12. If you are in a low or soft chair, you can lean back and then forward up to stand, in order to get more momentum. 13. Once you are standing, make sure you are looking ahead and standing tall.  To sit down:  1. Back up until you feel the chair behind your legs. 2. Bend at you hips, reaching  Back for you chair, if needed, then slowly squat to sit down on your chair.

## 2018-01-25 NOTE — Therapy (Signed)
[facility] [address] [place], [place], [address] Phone: [phone]   Fax:  [phone]  Physical Therapy Treatment  Patient Details  Name: Brian [name] MRN: [id number] Date of Birth: 06/26/1940 Referring Provider (PT): Rexene Alberts   Encounter Date: 01/25/2018  PT End of Session - 01/25/18 1038    Visit Number  3    Number of Visits  18    Date for PT Re-Evaluation  04/21/18    Authorization Type  Medicare and [place] Time  1023    PT Stop Time  1103    PT Time Calculation (min)  40 min    Behavior During Therapy  [facility] for tasks assessed/performed;Flat affect       Past Medical History:  Diagnosis Date  . Abnormal PFT 1. 05/18/08  2. 11/30/08   1. Showed mild airflow obstruction, mild restriction, mild diffusion defect; FEV1 2.22(64%), FVC 3.33(65%), FEVi% 67, TLC 5.19(69%), DLCO 77%, +BD  2. FEV1 2.38(73%), FVC 3.81(80%), FEV1% 63, TLC 5.61(80%), DLCO 79%, no BD  . Allergic rhinitis   . Anxiety   . BPH (benign prostatic hyperplasia)   . Cellulitis    right leg MRSA  . Erectile dysfunction   . GERD (gastroesophageal reflux disease)   . Heart murmur    mild MR by echo  . Hyperlipidemia   . Hypertension   . Osteoarthritis   . Paresthesias 06/22/2012  . Parkinsonism ([place]) 06/22/2012  . Permanent atrial fibrillation   . Restrictive lung disease     Past Surgical History:  Procedure Laterality Date  . CARDIAC CATHETERIZATION  11/2009   normal coronary arteries  . CARDIOVERSION     multiple  . CATARACT EXTRACTION    . COLONOSCOPY    . COLONOSCOPY WITH PROPOFOL N/A 09/02/2016   Procedure: COLONOSCOPY WITH PROPOFOL;  Surgeon: Garlan Fair, MD;  [facility];  Service: Endoscopy;  Laterality: N/A;  . EYE SURGERY     bilateral cataract extraction  . JOINT REPLACEMENT    . TOTAL KNEE ARTHROPLASTY Left 11/30/2014   Procedure: TOTAL KNEE ARTHROPLASTY;  Surgeon: Susa Day, MD;  Location:  [facility];  Service: Orthopedics;  Laterality: Left;  . TOTAL KNEE ARTHROPLASTY Right 07/12/2015   Procedure: RIGHT TOTAL KNEE ARTHROPLASTY;  Surgeon: Susa Day, MD;  [facility];  Service: Orthopedics;  Laterality: Right;  . TRICEPS TENDON REPAIR Right 05/20/2017  . TRICEPS TENDON REPAIR Right 05/20/2017   Procedure: TRICEPS TENDON REPAIR;  Surgeon: Iran Planas, MD;  Location: [facility];  Service: Orthopedics;  Laterality: Right;    There were no vitals filed for this visit.  Subjective Assessment - 01/25/18 1023    Subjective  No falls or changes from last visit.    Pertinent History  Fall February 2019 with tricep tendon tear, s/p 3 months [facility] and then [facility] therapies, L TKR 9/216, R TKR 06/2015    Patient Stated Goals  Pt's goal for PT is to drive car again, to help with balance.    Currently in Pain?  No/denies                       [facility] Adult PT Treatment/Exercise - 01/25/18 0001      Transfers   Transfers  Sit to Stand;Stand to Sit;Stand Pivot Transfers    Sit to Stand  4: Min guard;4: Min assist    Sit to Stand Details  Verbal  cues for sequencing;Verbal cues for technique;Tactile cues for weight shifting    Stand to Sit  4: Min guard    Stand Pivot Transfers  4: Min guard;5: Supervision    Stand Pivot Transfer Details (indicate cue type and reason)  cues for sequence with RW to turn and sit    Comments  Practised seated on mat with therapy ball in front, rolling bal forward to encourage forward weight shift, coming into a squat positon working on maintaining position with hands on ball and laterally weight shifting, then controlled sit down.  pt required mod cues and mod A to come up into squat position, multiple reps.                                                Ambulation/Gait   Ambulation/Gait  Yes    Ambulation/Gait Assistance  5: Supervision    Ambulation/Gait Assistance Details  instructing on amplitude of movment with stepping; cues for  step around obstacles.   Ambulation Distance (Feet)  115 Feet    Assistive device  Rolling walker    Gait Pattern  Decreased step length - right;Decreased step length - left;Right flexed knee in stance;Shuffle;Festinating;Trunk flexed;Narrow base of support;Poor foot clearance - right    Ambulation Surface  Level;Indoor      Knee/Hip Exercises: Aerobic   Stepper  SciFit, Seated Stepper, x 8 minutes, Level 1.0 to 4.0,  at self-selected pace, cues for greater ROM             PT Education - 01/25/18 1223    Education Details  Reviewed tips to reduce freezing during gait and body mechanics with transfers.    Person(s) Educated  Patient;Spouse    Methods  Explanation    Comprehension  Verbalized understanding       PT Short Term Goals - 01/22/18 1242      PT SHORT TERM GOAL #1   Title  Pt will perform HEP with wife's supervision for improved posture, transfers, balance, gait.  TARGET 02/26/18    Time  5    Period  Weeks    Status  New    Target Date  02/26/18      PT SHORT TERM GOAL #2   Title  Pt will perform at least 6 of 10 reps of sit<>stand with minimal UE support for improved transfer efficiency and safety.    Time  5    Period  Weeks    Status  New    Target Date  02/26/18      PT SHORT TERM GOAL #3   Title  Pt will improve TUG score to less than or equal to 70 seconds for decreased fall risk.    Time  5    Period  Weeks    Status  New    Target Date  02/26/18      PT SHORT TERM GOAL #4   Title  Pt/wife will demonstrate understanding of tips to reduce freezing with gait and turns.    Time  5    Period  Weeks    Status  New    Target Date  02/26/18        PT Long Term Goals - 01/22/18 1247      PT LONG TERM GOAL #1   Title  Pt/wife will verbalize understanding of fall prevention to  decrease fall risk in home environment.  TARGET 03/26/2018    Time  9    Period  Weeks    Status  New    Target Date  03/26/18      PT LONG TERM GOAL #2   Title  Pt will  perform at least 10 reps of sit<>stand with minimal to no UE support, modified independently for improved transfer efficiency and safety.    Time  9    Period  Weeks    Status  New    Target Date  03/26/18      PT LONG TERM GOAL #3   Title  Pt will improve TUG score to less than or equal to 50 seconds for decreased fall risk.    Time  9    Period  Weeks    Status  New    Target Date  03/26/18      PT LONG TERM GOAL #4   Title  Pt will improve gait velocity to at least 1.8 ft/sec for decreased fall risk, improved gait efficiency and safety.    Time  9    Period  Weeks    Status  New    Target Date  03/26/18      PT LONG TERM GOAL #5   Title  Berg balance test to be assessed, with goal to be written as appropriate.    Time  9    Period  Weeks    Status  New    Target Date  03/26/18            Plan - 01/25/18 1224    Clinical Impression Statement  Skilled session focused on instruction for amplitude and intensity of movement during LE strengthening exercise on seated stepper, during gait with RW, and transfer trainining.  Pt required cues for safe sequencing with RW (min guard level) during gait when turning to sit down and sequencing for proper body mechanics with transfers (supervison to min A).                                                                                                                   Rehab Potential  Good    Clinical Impairments Affecting Rehab Potential  supportive wife; cognition/severity of PD deficits    PT Frequency  2x / week    PT Duration  Other (comment)   9 weeks   PT Treatment/Interventions  ADLs/Self Care Home Management;DME Instruction;Gait training;Stair training;Functional mobility training;Therapeutic activities;Therapeutic exercise;Balance training;Patient/family education;Neuromuscular re-education    PT Next Visit Plan  Review transfer traning, techniques to reduce freezing with gait; initiate PWR! Moves in sitting; try to perform  Berg; gait training for increased step length and posture.    Consulted and Agree with Plan of Care  Patient;Family member/caregiver    Family Member Consulted  wife, [name] (end of session)       Patient will benefit from skilled therapeutic intervention in order to improve the following deficits and impairments:  Abnormal gait, Decreased  balance, Decreased coordination, Decreased mobility, Decreased safety awareness, Difficulty walking, Decreased strength, Impaired flexibility, Postural dysfunction  Visit Diagnosis: Other abnormalities of gait and mobility  Other symptoms and signs involving the nervous system  Unsteadiness on feet     Problem List Patient Active Problem List   Diagnosis Date Noted  . Rupture of right triceps tendon 05/20/2017  . Triceps tendon rupture, right, initial encounter 05/20/2017  . Atrial fibrillation ([place]) [I48.91] 02/05/2017  . Urinary retention 12/31/2015  . Acute renal failure (ARF) ([place]) 12/31/2015  . Acute encephalopathy 12/31/2015  . Primary osteoarthritis of right knee 07/12/2015  . Primary osteoarthritis of left knee 11/30/2014  . Encounter for therapeutic drug monitoring 06/21/2013  . Edema of extremities 02/04/2013  . Chronic anticoagulation 02/04/2013  . GERD (gastroesophageal reflux disease)   . Restrictive lung disease   . CKD (chronic kidney disease) stage 2, GFR 60-89 ml/min   . Cellulitis   . Allergic rhinitis   . Anxiety   . Long term (current) use of anticoagulants 01/25/2013  . Parkinsonism ([place]) 06/22/2012  . Paresthesias 06/22/2012  . PULMONARY NODULE 06/02/2008  . SNORING 06/02/2008  . Hyperlipidemia 05/15/2008  . Essential hypertension 05/15/2008  . PULMONARY FUNCTION TESTS, ABNORMAL 05/15/2008    Bjorn Loser, PTA  01/25/18, 12:34 PM [facility] [address] [place], [place], [address] Phone: [phone]   Fax:  [phone]  Name: Brian [name] MRN: [id number] Date of Birth: 1940-08-23

## 2018-01-28 ENCOUNTER — Ambulatory Visit: Payer: Medicare Other | Admitting: Occupational Therapy

## 2018-01-28 ENCOUNTER — Ambulatory Visit: Payer: Medicare Other | Admitting: Speech Pathology

## 2018-01-28 ENCOUNTER — Ambulatory Visit: Payer: Medicare Other | Admitting: Physical Therapy

## 2018-01-28 ENCOUNTER — Encounter: Payer: Self-pay | Admitting: Physical Therapy

## 2018-01-28 DIAGNOSIS — R2689 Other abnormalities of gait and mobility: Secondary | ICD-10-CM | POA: Diagnosis not present

## 2018-01-28 DIAGNOSIS — R293 Abnormal posture: Secondary | ICD-10-CM

## 2018-01-28 DIAGNOSIS — M6281 Muscle weakness (generalized): Secondary | ICD-10-CM | POA: Diagnosis not present

## 2018-01-28 DIAGNOSIS — R278 Other lack of coordination: Secondary | ICD-10-CM

## 2018-01-28 DIAGNOSIS — R2681 Unsteadiness on feet: Secondary | ICD-10-CM | POA: Diagnosis not present

## 2018-01-28 DIAGNOSIS — R29818 Other symptoms and signs involving the nervous system: Secondary | ICD-10-CM | POA: Diagnosis not present

## 2018-01-28 DIAGNOSIS — R41844 Frontal lobe and executive function deficit: Secondary | ICD-10-CM

## 2018-01-28 DIAGNOSIS — R471 Dysarthria and anarthria: Secondary | ICD-10-CM

## 2018-01-28 NOTE — Therapy (Signed)
[facility] [address] [place], [place], [address] Phone: [phone]   Fax:  [phone]  Occupational Therapy Treatment  Patient Details  Name: Brian [name] MRN: [id number] Date of Birth: 1940/12/28 Referring Provider (OT): Dr. Rexene Alberts   Encounter Date: 01/28/2018  OT End of Session - 01/28/18 1326    Visit Number  3    Number of Visits  25    Date for OT Re-Evaluation  04/22/18    Authorization Type  Medicare    Authorization Time Period  cert 12 weeks, anticipate d/c following 8 weeks    Authorization - Visit Number  3    Authorization - Number of Visits  10    OT Start Time  1321    OT Stop Time  1400    OT Time Calculation (min)  39 min    Activity Tolerance  Patient tolerated treatment well    Behavior During Therapy  [facility] for tasks assessed/performed;Flat affect       Past Medical History:  Diagnosis Date  . Abnormal PFT 1. 05/18/08  2. 11/30/08   1. Showed mild airflow obstruction, mild restriction, mild diffusion defect; FEV1 2.22(64%), FVC 3.33(65%), FEVi% 67, TLC 5.19(69%), DLCO 77%, +BD  2. FEV1 2.38(73%), FVC 3.81(80%), FEV1% 63, TLC 5.61(80%), DLCO 79%, no BD  . Allergic rhinitis   . Anxiety   . BPH (benign prostatic hyperplasia)   . Cellulitis    right leg MRSA  . Erectile dysfunction   . GERD (gastroesophageal reflux disease)   . Heart murmur    mild MR by echo  . Hyperlipidemia   . Hypertension   . Osteoarthritis   . Paresthesias 06/22/2012  . Parkinsonism ([place]) 06/22/2012  . Permanent atrial fibrillation   . Restrictive lung disease     Past Surgical History:  Procedure Laterality Date  . CARDIAC CATHETERIZATION  11/2009   normal coronary arteries  . CARDIOVERSION     multiple  . CATARACT EXTRACTION    . COLONOSCOPY    . COLONOSCOPY WITH PROPOFOL N/A 09/02/2016   Procedure: COLONOSCOPY WITH PROPOFOL;  Surgeon: Garlan Fair, MD;  Location: [facility];  Service: Endoscopy;   Laterality: N/A;  . EYE SURGERY     bilateral cataract extraction  . JOINT REPLACEMENT    . TOTAL KNEE ARTHROPLASTY Left 11/30/2014   Procedure: TOTAL KNEE ARTHROPLASTY;  Surgeon: Susa Day, MD;  Location: [facility];  Service: Orthopedics;  Laterality: Left;  . TOTAL KNEE ARTHROPLASTY Right 07/12/2015   Procedure: RIGHT TOTAL KNEE ARTHROPLASTY;  Surgeon: Susa Day, MD;  Location: [facility];  Service: Orthopedics;  Laterality: Right;  . TRICEPS TENDON REPAIR Right 05/20/2017  . TRICEPS TENDON REPAIR Right 05/20/2017   Procedure: TRICEPS TENDON REPAIR;  Surgeon: Iran Planas, MD;  Location: [facility];  Service: Orthopedics;  Laterality: Right;    There were no vitals filed for this visit.  Subjective Assessment - 01/28/18 1325    Patient Stated Goals  to be as independent as he can, to be able dress himself    Currently in Pain?  No/denies             Treatment: seated PWR! Up and twist with mod /max v.c and difficulty for large amplitude. Therapist transitioned pt to ball exercises in seated with greater success.  Pt requires minguard and mod v.c for ambulation with walker for upright posture and turning. Pt reports a fall last night but he was unharmed.  OT Education - 01/28/18 1414    Education Details  seated ball exercises, see pt instructions 10-20 reps each, min-mod v.c    Person(s) Educated  Patient    Methods  Explanation;Demonstration;Verbal cues;Handout    Comprehension  Verbalized understanding;Returned demonstration;Verbal cues required;Need further instruction       OT Short Term Goals - 01/22/18 1242      OT SHORT TERM GOAL #1   Title  Pt/ caregeiver willl be I with PD specific HEP    Time  4    Period  Weeks    Status  New    Target Date  02/21/18      OT SHORT TERM GOAL #2   Title  Pt /wife will verbalize understanding of adapted strategies for ADLS/ IADLs to maximize safety and independence.    Time  4    Period  Weeks    Status   New      OT SHORT TERM GOAL #3   Title  Pt will perfom LB dressing with min A    Baseline  max A    Time  4    Period  Weeks    Status  New      OT SHORT TERM GOAL #4   Title  Pt will demonstrate improved fine motor coordination as evidenced by decreasing 9 hole pegs test by 4 secs bilaterally.    Baseline  RUE 68.66 secs, LUE 68.82 secs    Time  4    Period  Weeks    Status  New      OT SHORT TERM GOAL #5   Title  Pt/ wife will verbalize understanding of compensatory strategies for memory/ cognition    Time  4    Period  Weeks    Status  New        OT Long Term Goals - 01/22/18 1246      OT LONG TERM GOAL #1   Title  Pt and wife will verbalize understanding of ways to prevent future complications and appropriate community resources    Time  12    Period  Weeks    Status  New    Target Date  04/22/18      OT LONG TERM GOAL #2   Title  Pt will demonstrate aiblity to retrieve a lightweight object at 115 shoulder flexion and -35 elbow extension with RUE.    Time  12    Period  Weeks    Status  New      OT LONG TERM GOAL #3   Title  Pt.will demonstrate ability to retrieve a lightweight obect at 120 shoulder flexion with LUE.    Time  12    Period  Weeks    Status  New      OT LONG TERM GOAL #4   Title  Pt will demonstrate improved gross motor coordination/ bilateral UE functional use for ADLs as evidenced by increasing box/ blocks score bilaterally by 3 blocks    Baseline  RUE 26 blocks, LUe 24 blocks     Time  12    Period  Weeks    Status  New      OT LONG TERM GOAL #5   Title  Pt will demonstrae ability to fasten 3 buttons in 75 secs or less    Time  12    Period  Weeks    Status  New  Plan - 01/28/18 1412    Clinical Impression Statement  Pt is progressing towards goals. He demonstrates improved amplitude of movement with repetition and cueing as well as increased alertness following exercise.    Occupational Profile and client history  currently impacting functional performance  Pt was independent with ADLS prior to his triceps tear in February, now he requires assistance with ADLS and has had several falls.HGD:JMEQASTMH'D disease(diagnosed with parkinsonism 2012)triceps tear Feb 2019,R TKR 2016, L TKR 2017, anxiety, arthritis    Occupational performance deficits (Please refer to evaluation for details):  IADL's;ADL's;Leisure;Social Participation    Rehab Potential  Fair    Current Impairments/barriers affecting progress:  length of time since onset, severity of deficits, cognitive deficits    OT Frequency  2x / week    OT Duration  12 weeks    OT Treatment/Interventions  Self-care/ADL training;Therapeutic exercise;Gait Training;Aquatic Therapy;Moist Heat;Paraffin;Neuromuscular education;Balance training;Patient/family education;Therapeutic activities;Functional Mobility Training;Fluidtherapy;Cryotherapy;Ultrasound;Contrast Bath;DME and/or AE instruction;Manual Therapy;Passive range of motion;Cognitive remediation/compensation    Plan  ADL strategies, review ball ex in seated / coordination HEP    Consulted and Agree with Plan of Care  Patient    Family Member Consulted  wife       Patient will benefit from skilled therapeutic intervention in order to improve the following deficits and impairments:  Abnormal gait, Decreased cognition, Impaired flexibility, Pain, Decreased coordination, Decreased mobility, Decreased strength, Decreased range of motion, Decreased activity tolerance, Decreased balance, Decreased endurance, Decreased knowledge of precautions, Decreased safety awareness, Difficulty walking, Impaired perceived functional ability, Impaired UE functional use  Visit Diagnosis: Other symptoms and signs involving the nervous system  Abnormal posture  Muscle weakness (generalized)  Other lack of coordination  Frontal lobe and executive function deficit  Other abnormalities of gait and mobility  Unsteadiness on  feet    Problem List Patient Active Problem List   Diagnosis Date Noted  . Rupture of right triceps tendon 05/20/2017  . Triceps tendon rupture, right, initial encounter 05/20/2017  . Atrial fibrillation ([place]) [I48.91] 02/05/2017  . Urinary retention 12/31/2015  . Acute renal failure (ARF) ([place]) 12/31/2015  . Acute encephalopathy 12/31/2015  . Primary osteoarthritis of right knee 07/12/2015  . Primary osteoarthritis of left knee 11/30/2014  . Encounter for therapeutic drug monitoring 06/21/2013  . Edema of extremities 02/04/2013  . Chronic anticoagulation 02/04/2013  . GERD (gastroesophageal reflux disease)   . Restrictive lung disease   . CKD (chronic kidney disease) stage 2, GFR 60-89 ml/min   . Cellulitis   . Allergic rhinitis   . Anxiety   . Long term (current) use of anticoagulants 01/25/2013  . Parkinsonism ([place]) 06/22/2012  . Paresthesias 06/22/2012  . PULMONARY NODULE 06/02/2008  . SNORING 06/02/2008  . Hyperlipidemia 05/15/2008  . Essential hypertension 05/15/2008  . PULMONARY FUNCTION TESTS, ABNORMAL 05/15/2008    , 01/28/2018, 2:15 PM  [facility] [address] [place], [place], [address] Phone: [phone]   Fax:  [phone]  Name: Brian [name] MRN: [id number] Date of Birth: Jul 22, 1940

## 2018-01-28 NOTE — Patient Instructions (Signed)
Perform these exercises at home sitting in a chair with a light ball  SHOULDER: Flexion Bilateral    Perform while sitting, Hold ball, Raise arms overhead at same speed. Keep elbows straight. _10-20__ reps per set, __1_ sets per day, __5_ days per week Maintain object in midline.  Then repeat performing 10 diagonals in each direction( twist body while holding ball in a seated position to reach over left shoulder then to right knee, perform 10 then repeat 10 reps to other side- up to right shoulder and down to left knee- 10 reps  Seated chest press-Seated hold ball between hands with elbows bent, stretch arms out in front then bring back to chest 10-20 reps, 1x day  Copyright  VHI. All rights reserved.

## 2018-01-28 NOTE — Therapy (Signed)
[facility] [address] [place], [place], [address] Phone: [phone]   Fax:  [phone]  Speech Language Pathology Treatment  Patient Details  Name: Brian [name] MRN: [id number] Date of Birth: 06/20/1940 Referring Provider (SLP): Star Age, MD   Encounter Date: 01/28/2018  End of Session - 01/28/18 1542    Visit Number  3    Number of Visits  17    Date for SLP Re-Evaluation  04/21/18    SLP Start Time  1449   slow transition from PT   SLP Stop Time   7106    SLP Time Calculation (min)  41 min    Activity Tolerance  Patient tolerated treatment well       Past Medical History:  Diagnosis Date  . Abnormal PFT 1. 05/18/08  2. 11/30/08   1. Showed mild airflow obstruction, mild restriction, mild diffusion defect; FEV1 2.22(64%), FVC 3.33(65%), FEVi% 67, TLC 5.19(69%), DLCO 77%, +BD  2. FEV1 2.38(73%), FVC 3.81(80%), FEV1% 63, TLC 5.61(80%), DLCO 79%, no BD  . Allergic rhinitis   . Anxiety   . BPH (benign prostatic hyperplasia)   . Cellulitis    right leg MRSA  . Erectile dysfunction   . GERD (gastroesophageal reflux disease)   . Heart murmur    mild MR by echo  . Hyperlipidemia   . Hypertension   . Osteoarthritis   . Paresthesias 06/22/2012  . Parkinsonism ([place]) 06/22/2012  . Permanent atrial fibrillation   . Restrictive lung disease     Past Surgical History:  Procedure Laterality Date  . CARDIAC CATHETERIZATION  11/2009   normal coronary arteries  . CARDIOVERSION     multiple  . CATARACT EXTRACTION    . COLONOSCOPY    . COLONOSCOPY WITH PROPOFOL N/A 09/02/2016   Procedure: COLONOSCOPY WITH PROPOFOL;  Surgeon: Garlan Fair, MD;  [facility];  Service: Endoscopy;  Laterality: N/A;  . EYE SURGERY     bilateral cataract extraction  . JOINT REPLACEMENT    . TOTAL KNEE ARTHROPLASTY Left 11/30/2014   Procedure: TOTAL KNEE ARTHROPLASTY;  Surgeon: Susa Day, MD;  [facility];   Service: Orthopedics;  Laterality: Left;  . TOTAL KNEE ARTHROPLASTY Right 07/12/2015   Procedure: RIGHT TOTAL KNEE ARTHROPLASTY;  Surgeon: Susa Day, MD;  [facility];  Service: Orthopedics;  Laterality: Right;  . TRICEPS TENDON REPAIR Right 05/20/2017  . TRICEPS TENDON REPAIR Right 05/20/2017   Procedure: TRICEPS TENDON REPAIR;  Surgeon: Iran Planas, MD;  Location: [facility];  Service: Orthopedics;  Laterality: Right;    There were no vitals filed for this visit.  Subjective Assessment - 01/28/18 1455    Subjective  "Yes I have been!" Pt louder when asked about /a/    Patient is accompained by:  --   wife stayed in waiting room   Currently in Pain?  No/denies            ADULT SLP TREATMENT - 01/28/18 1450      General Information   Behavior/Cognition  Alert;Cooperative      Treatment Provided   Treatment provided  Cognitive-Linquistic      Cognitive-Linquistic Treatment   Treatment focused on  Dysarthria;Voice    Skilled Treatment  Pt with hypophonia, hypoarticulation when entering ST room (average low-mid 60s). Spontaneous response louder when asked about practicing loud /a/ (68 dB). Loud /a/ for recalibration of loudness in speech tasks average 88dB at 30 cm, usual mod A for effort  and to prevent loudness fade. In word and phrase level reading tasks, pt averaged low-mid 70s dB with occasional min A. As cognitive load added, pt required usual mod cues for loudness, particularly at the end of utterances. Pt demo'd awareness of softer speech x2, making 2 self corrections. Usual mod-max cues required for carryover in conversation between tasks.      Assessment / Recommendations / Plan   Plan  Continue with current plan of care      Progression Toward Goals   Progression toward goals  Progressing toward goals         SLP Short Term Goals - 01/28/18 1543      SLP SHORT TERM GOAL #1   Title  Pt will generate loud /a/ with average of upper 80s dB over 3 sessions     Time  4    Period  Weeks    Status  On-going      SLP SHORT TERM GOAL #2   Title  Pt will use abdominal breathing at rest 85% success over 2 sessions    Time  4    Period  Weeks    Status  On-going      SLP SHORT TERM GOAL #3   Title  Pt will use speech volume average low 70sdB when responding with sentences 18/20 over two sessions    Time  4    Period  Weeks    Status  On-going      SLP SHORT TERM GOAL #4   Title  pt will tell SLP 3 anomia strategies over three sessions    Time  4    Period  Weeks    Status  On-going       SLP Long Term Goals - 01/28/18 1543      SLP LONG TERM GOAL #1   Title  pt will generate loud /a/ at upper 80s - low 90s dB over 4 sessions    Time  8   or 17 visits, for all LTGs   Period  Weeks   or 17 visits, for all LTGs   Status  On-going      SLP LONG TERM GOAL #2   Title  Pt will use abdominal breathing in sentences or short conversation 80% success over 2 sessions    Time  8    Period  Weeks    Status  On-going      SLP LONG TERM GOAL #3   Title  pt will use anomia compensations in 75% of opportunities in sentence responses, with min verbal cues to use them, over 3 sessions    Time  8    Period  Weeks    Status  On-going      SLP LONG TERM GOAL #4   Title  pt will undergo objective swallow eval PRN    Time  4    Period  Weeks    Status  On-going       Plan - 01/28/18 1543    Clinical Impression Statement  Pt presents with dysarthria including intermittent reduced speech volume (especially with longer utterances and as conversation progresses), reduced breath support, and imprecise consonants which all reduce pt's speech intelligibility. Pt stimulable for louder speech (low 70s dB) with occasional min A after loud /a/ today.  Pt may be administered the [employer] in subsequent sessions. Pt may require a modified barium swallow or FEES at some point during this therapy course. He would benefit from  skilled ST addressing these  skills to communicate more efficiently.     Speech Therapy Frequency  2x / week    Duration  --   8 weeks or 17 visits   Potential to Achieve Goals  Good       Patient will benefit from skilled therapeutic intervention in order to improve the following deficits and impairments:   Dysarthria and anarthria    Problem List Patient Active Problem List   Diagnosis Date Noted  . Rupture of right triceps tendon 05/20/2017  . Triceps tendon rupture, right, initial encounter 05/20/2017  . Atrial fibrillation ([place]) [I48.91] 02/05/2017  . Urinary retention 12/31/2015  . Acute renal failure (ARF) ([place]) 12/31/2015  . Acute encephalopathy 12/31/2015  . Primary osteoarthritis of right knee 07/12/2015  . Primary osteoarthritis of left knee 11/30/2014  . Encounter for therapeutic drug monitoring 06/21/2013  . Edema of extremities 02/04/2013  . Chronic anticoagulation 02/04/2013  . GERD (gastroesophageal reflux disease)   . Restrictive lung disease   . CKD (chronic kidney disease) stage 2, GFR 60-89 ml/min   . Cellulitis   . Allergic rhinitis   . Anxiety   . Long term (current) use of anticoagulants 01/25/2013  . Parkinsonism ([place]) 06/22/2012  . Paresthesias 06/22/2012  . PULMONARY NODULE 06/02/2008  . SNORING 06/02/2008  . Hyperlipidemia 05/15/2008  . Essential hypertension 05/15/2008  . PULMONARY FUNCTION TESTS, ABNORMAL 05/15/2008   Deneise Lever, [place], [place] Speech-Language Pathologist  Aliene Altes 01/28/2018, 3:45 PM  [facility] [address] [place], [place], [address] Phone: [phone]   Fax:  [phone]   Name: Brian [name] MRN: [id number] Date of Birth: [birth year]

## 2018-01-29 NOTE — Therapy (Signed)
[facility] [address] [place], [place], [address] Phone: [phone]   Fax:  [phone]  Physical Therapy Treatment  Patient Details  Name: Brian [name] MRN: [id number] Date of Birth: [birth year] Referring Provider (PT): Rexene Alberts   Encounter Date: 01/28/2018  PT End of Session - 01/29/18 1423    Visit Number  4    Number of Visits  18    Date for PT Re-Evaluation  04/21/18    Authorization Type  Medicare and [place] Time  2703    PT Stop Time  1445    PT Time Calculation (min)  41 min    Activity Tolerance  Patient tolerated treatment well    Behavior During Therapy  [facility] for tasks assessed/performed;Flat affect       Past Medical History:  Diagnosis Date  . Abnormal PFT 1. 05/18/08  2. 11/30/08   1. Showed mild airflow obstruction, mild restriction, mild diffusion defect; FEV1 2.22(64%), FVC 3.33(65%), FEVi% 67, TLC 5.19(69%), DLCO 77%, +BD  2. FEV1 2.38(73%), FVC 3.81(80%), FEV1% 63, TLC 5.61(80%), DLCO 79%, no BD  . Allergic rhinitis   . Anxiety   . BPH (benign prostatic hyperplasia)   . Cellulitis    right leg MRSA  . Erectile dysfunction   . GERD (gastroesophageal reflux disease)   . Heart murmur    mild MR by echo  . Hyperlipidemia   . Hypertension   . Osteoarthritis   . Paresthesias 06/22/2012  . Parkinsonism ([place]) 06/22/2012  . Permanent atrial fibrillation   . Restrictive lung disease     Past Surgical History:  Procedure Laterality Date  . CARDIAC CATHETERIZATION  11/2009   normal coronary arteries  . CARDIOVERSION     multiple  . CATARACT EXTRACTION    . COLONOSCOPY    . COLONOSCOPY WITH PROPOFOL N/A 09/02/2016   Procedure: COLONOSCOPY WITH PROPOFOL;  Surgeon: Garlan Fair, MD;  Location: [facility];  Service: Endoscopy;  Laterality: N/A;  . EYE SURGERY     bilateral cataract extraction  . JOINT REPLACEMENT    . TOTAL KNEE ARTHROPLASTY Left 11/30/2014   Procedure: TOTAL  KNEE ARTHROPLASTY;  Surgeon: Susa Day, MD;  Location: [facility];  Service: Orthopedics;  Laterality: Left;  . TOTAL KNEE ARTHROPLASTY Right 07/12/2015   Procedure: RIGHT TOTAL KNEE ARTHROPLASTY;  Surgeon: Susa Day, MD;  Location: [facility];  Service: Orthopedics;  Laterality: Right;  . TRICEPS TENDON REPAIR Right 05/20/2017  . TRICEPS TENDON REPAIR Right 05/20/2017   Procedure: TRICEPS TENDON REPAIR;  Surgeon: Iran Planas, MD;  Location: [facility];  Service: Orthopedics;  Laterality: Right;    There were no vitals filed for this visit.  Subjective Assessment - 01/29/18 1409    Subjective  Had a fall yesterday trying to reach for something.  (Wife reports he wasn't using walker when he fell).  Needed help to get up from the floor.    Pertinent History  Fall February 2019 with tricep tendon tear, s/p 3 months [facility] and then [facility] therapies, L TKR 9/216, R TKR 06/2015    Patient Stated Goals  Pt's goal for PT is to drive car again, to help with balance.    Currently in Pain?  No/denies                       [facility] Adult PT Treatment/Exercise - 01/29/18 1412      Transfers  Transfers  Sit to Stand;Stand to [employer] Transfers    Sit to Stand  4: Min guard;4: Min assist    Sit to Stand Details  Verbal cues for sequencing;Verbal cues for technique;Tactile cues for weight shifting    Stand to Sit  4: Min guard    Comments  Multiple reps throughout session.  Progressed to practicing turning to sit       Ambulation/Gait   Ambulation/Gait  Yes    Ambulation/Gait Assistance  5: Supervision;4: Min guard    Ambulation Distance (Feet)  115 Feet   x 2 reps; also shorter bouts of gait   Assistive device  --   U-step RW-(used clinic U-step)   Gait Pattern  Decreased step length - right;Decreased step length - left;Right flexed knee in stance;Shuffle;Festinating;Trunk flexed;Narrow base of support;Poor foot clearance - right    Ambulation Surface  Level;Indoor     Pre-Gait Activities  Multiple reps of short distance gait with turns-multimodal cues for marching in place, high-step marching with increased intensity, to fully turn all the way around and back up to fully reach seat and sit down.  Standing at locked walker:  marching in place 2 sets x 10, with cues for increased amplitude of movement for R foot clearance.  Forward step and weightshift x 10 reps at locked U-step.      Standardized Balance Assessment   Standardized Balance Assessment  Berg Balance Test      Berg Balance Test   Sit to Stand  Able to stand  independently using hands    Standing Unsupported  Able to stand 2 minutes with supervision    Sitting with Back Unsupported but Feet Supported on Floor or Stool  Able to sit safely and securely 2 minutes    Stand to Sit  Controls descent by using hands    Transfers  Able to transfer with verbal cueing and /or supervision    Standing Unsupported with Eyes Closed  Able to stand 10 seconds with supervision    Standing Ubsupported with Feet Together  Needs help to attain position but able to stand for 30 seconds with feet together    From Standing, Reach Forward with Outstretched Arm  Can reach forward >12 cm safely (5")    From Standing Position, Pick up Object from Floor  Unable to try/needs assist to keep balance    From Standing Position, Turn to Look Behind Over each Shoulder  Needs assist to keep from losing balance and falling    Turn 360 Degrees  Needs assistance while turning   1:04 to turn   Standing Unsupported, Alternately Place Feet on Step/Stool  Needs assistance to keep from falling or unable to try    Standing Unsupported, One Foot in Front  Needs help to step but can hold 15 seconds    Standing on One Leg  Unable to try or needs assist to prevent fall    Total Score  23             PT Education - 01/29/18 1421    Education Details  Discussed Merrilee Jansky results and fall risk; discussed need to use U-step rollator at all times  within home for improved safety.    Person(s) Educated  Patient;Spouse    Methods  Explanation;Demonstration;Handout    Comprehension  Verbalized understanding;Returned demonstration       PT Short Term Goals - 01/22/18 1242      PT SHORT TERM GOAL #1   Title  Pt will perform HEP with wife's supervision for improved posture, transfers, balance, gait.  TARGET 02/26/18    Time  5    Period  Weeks    Status  New    Target Date  02/26/18      PT SHORT TERM GOAL #2   Title  Pt will perform at least 6 of 10 reps of sit<>stand with minimal UE support for improved transfer efficiency and safety.    Time  5    Period  Weeks    Status  New    Target Date  02/26/18      PT SHORT TERM GOAL #3   Title  Pt will improve TUG score to less than or equal to 70 seconds for decreased fall risk.    Time  5    Period  Weeks    Status  New    Target Date  02/26/18      PT SHORT TERM GOAL #4   Title  Pt/wife will demonstrate understanding of tips to reduce freezing with gait and turns.    Time  5    Period  Weeks    Status  New    Target Date  02/26/18        PT Long Term Goals - 01/22/18 1247      PT LONG TERM GOAL #1   Title  Pt/wife will verbalize understanding of fall prevention to decrease fall risk in home environment.  TARGET 03/26/2018    Time  9    Period  Weeks    Status  New    Target Date  03/26/18      PT LONG TERM GOAL #2   Title  Pt will perform at least 10 reps of sit<>stand with minimal to no UE support, modified independently for improved transfer efficiency and safety.    Time  9    Period  Weeks    Status  New    Target Date  03/26/18      PT LONG TERM GOAL #3   Title  Pt will improve TUG score to less than or equal to 50 seconds for decreased fall risk.    Time  9    Period  Weeks    Status  New    Target Date  03/26/18      PT LONG TERM GOAL #4   Title  Pt will improve gait velocity to at least 1.8 ft/sec for decreased fall risk, improved gait efficiency and  safety.    Time  9    Period  Weeks    Status  New    Target Date  03/26/18      PT LONG TERM GOAL #5   Title  Berg balance test to be assessed, with goal to be written as appropriate.    Time  9    Period  Weeks    Status  New    Target Date  03/26/18            Plan - 01/29/18 1424    Clinical Impression Statement  Skilled PT session focused on transfers, gait, and turns, with focus on proper use of U-step rolling walker with turns.  Completed Berg Balance test, with Berg score 23/56 indicated high fall risk.  Educated patient and wife on need to use device at all times for safety with gait and to reduce falls.    Rehab Potential  Good    Clinical Impairments Affecting Rehab Potential  supportive wife; cognition/severity of PD deficits    PT Frequency  2x / week    PT Duration  Other (comment)   9 weeks   PT Treatment/Interventions  ADLs/Self Care Home Management;DME Instruction;Gait training;Stair training;Functional mobility training;Therapeutic activities;Therapeutic exercise;Balance training;Patient/family education;Neuromuscular re-education    PT Next Visit Plan  Review transfer training; standing (modified PWR! Moves) to increase step length and posture; ?supine PWR! Moves; gait training for increased step length and posture.    Consulted and Agree with Plan of Care  Patient;Family member/caregiver    Family Member Consulted  wife, [name]        Patient will benefit from skilled therapeutic intervention in order to improve the following deficits and impairments:  Abnormal gait, Decreased balance, Decreased coordination, Decreased mobility, Decreased safety awareness, Difficulty walking, Decreased strength, Impaired flexibility, Postural dysfunction  Visit Diagnosis: Unsteadiness on feet  Other abnormalities of gait and mobility  Abnormal posture     Problem List Patient Active Problem List   Diagnosis Date Noted  . Rupture of right triceps tendon 05/20/2017   . Triceps tendon rupture, right, initial encounter 05/20/2017  . Atrial fibrillation ([place]) [I48.91] 02/05/2017  . Urinary retention 12/31/2015  . Acute renal failure (ARF) ([place]) 12/31/2015  . Acute encephalopathy 12/31/2015  . Primary osteoarthritis of right knee 07/12/2015  . Primary osteoarthritis of left knee 11/30/2014  . Encounter for therapeutic drug monitoring 06/21/2013  . Edema of extremities 02/04/2013  . Chronic anticoagulation 02/04/2013  . GERD (gastroesophageal reflux disease)   . Restrictive lung disease   . CKD (chronic kidney disease) stage 2, GFR 60-89 ml/min   . Cellulitis   . Allergic rhinitis   . Anxiety   . Long term (current) use of anticoagulants 01/25/2013  . Parkinsonism ([place]) 06/22/2012  . Paresthesias 06/22/2012  . PULMONARY NODULE 06/02/2008  . SNORING 06/02/2008  . Hyperlipidemia 05/15/2008  . Essential hypertension 05/15/2008  . PULMONARY FUNCTION TESTS, ABNORMAL 05/15/2008    Tobie Hellen W. 01/29/2018, 2:30 PM  Frazier Butt., PT   [facility] [address] [place], [place], [address] Phone: [phone]   Fax:  [phone]  Name: Brian [name] MRN: [id number] Date of Birth: [birth year]

## 2018-02-11 ENCOUNTER — Ambulatory Visit: Payer: Medicare Other | Admitting: Occupational Therapy

## 2018-02-11 ENCOUNTER — Ambulatory Visit: Payer: Medicare Other

## 2018-02-11 DIAGNOSIS — M6281 Muscle weakness (generalized): Secondary | ICD-10-CM

## 2018-02-11 DIAGNOSIS — R278 Other lack of coordination: Secondary | ICD-10-CM

## 2018-02-11 DIAGNOSIS — R41844 Frontal lobe and executive function deficit: Secondary | ICD-10-CM

## 2018-02-11 DIAGNOSIS — R2689 Other abnormalities of gait and mobility: Secondary | ICD-10-CM

## 2018-02-11 DIAGNOSIS — R1310 Dysphagia, unspecified: Secondary | ICD-10-CM

## 2018-02-11 DIAGNOSIS — R29818 Other symptoms and signs involving the nervous system: Secondary | ICD-10-CM | POA: Diagnosis not present

## 2018-02-11 DIAGNOSIS — R293 Abnormal posture: Secondary | ICD-10-CM | POA: Diagnosis not present

## 2018-02-11 DIAGNOSIS — R471 Dysarthria and anarthria: Secondary | ICD-10-CM

## 2018-02-11 DIAGNOSIS — R2681 Unsteadiness on feet: Secondary | ICD-10-CM | POA: Diagnosis not present

## 2018-02-11 NOTE — Therapy (Signed)
[facility] [address] [place], [place], [address] Phone: [phone]   Fax:  [phone]  Occupational Therapy Treatment  Patient Details  Name: Brian [name] MRN: [id number] Date of Birth: May 24, 1940 Referring Provider (OT): Dr. Rexene Alberts   Encounter Date: 02/11/2018  OT End of Session - 02/11/18 0906    Visit Number  4    Number of Visits  25    Date for OT Re-Evaluation  04/22/18    Authorization Type  Medicare    Authorization Time Period  cert 12 weeks, anticipate d/c following 8 weeks    Authorization - Visit Number  4    Authorization - Number of Visits  10    OT Start Time  [phone]    OT Stop Time  0930    OT Time Calculation (min)  39 min    Activity Tolerance  Patient tolerated treatment well    Behavior During Therapy  [facility] for tasks assessed/performed;Flat affect       Past Medical History:  Diagnosis Date  . Abnormal PFT 1. 05/18/08  2. 11/30/08   1. Showed mild airflow obstruction, mild restriction, mild diffusion defect; FEV1 2.22(64%), FVC 3.33(65%), FEVi% 67, TLC 5.19(69%), DLCO 77%, +BD  2. FEV1 2.38(73%), FVC 3.81(80%), FEV1% 63, TLC 5.61(80%), DLCO 79%, no BD  . Allergic rhinitis   . Anxiety   . BPH (benign prostatic hyperplasia)   . Cellulitis    right leg MRSA  . Erectile dysfunction   . GERD (gastroesophageal reflux disease)   . Heart murmur    mild MR by echo  . Hyperlipidemia   . Hypertension   . Osteoarthritis   . Paresthesias 06/22/2012  . Parkinsonism ([place]) 06/22/2012  . Permanent atrial fibrillation   . Restrictive lung disease     Past Surgical History:  Procedure Laterality Date  . CARDIAC CATHETERIZATION  11/2009   normal coronary arteries  . CARDIOVERSION     multiple  . CATARACT EXTRACTION    . COLONOSCOPY    . COLONOSCOPY WITH PROPOFOL N/A 09/02/2016   Procedure: COLONOSCOPY WITH PROPOFOL;  Surgeon: Garlan Fair, MD;  [facility];  Service: Endoscopy;   Laterality: N/A;  . EYE SURGERY     bilateral cataract extraction  . JOINT REPLACEMENT    . TOTAL KNEE ARTHROPLASTY Left 11/30/2014   Procedure: TOTAL KNEE ARTHROPLASTY;  Surgeon: Susa Day, MD;  [facility];  Service: Orthopedics;  Laterality: Left;  . TOTAL KNEE ARTHROPLASTY Right 07/12/2015   Procedure: RIGHT TOTAL KNEE ARTHROPLASTY;  Surgeon: Susa Day, MD;  [facility];  Service: Orthopedics;  Laterality: Right;  . TRICEPS TENDON REPAIR Right 05/20/2017  . TRICEPS TENDON REPAIR Right 05/20/2017   Procedure: TRICEPS TENDON REPAIR;  Surgeon: Iran Planas, MD;  Location: [facility];  Service: Orthopedics;  Laterality: Right;    There were no vitals filed for this visit.  Subjective Assessment - 02/11/18 1712    Patient Stated Goals  to be as independent as he can, to be able dress himself    Currently in Pain?  No/denies                Treatment: seated reviewed ball exercises for shoulder flexion and diagonals, mod v.c and rest break in between sets. Simulated UE dressing with plastic bag, reach over head followed by stretching down to ankles 10 reps each, for increased flexibility, min v.c Standing dynamic rock and reach to place graded clothespins on vertical antennae  with left and right UE's, min facilitation/ v.c for larger amplitude movements Pt requires continued v.c for upright posture and larger steps when ambulating with walker to waiting room.             OT Short Term Goals - 01/22/18 1242      OT SHORT TERM GOAL #1   Title  Pt/ caregeiver willl be I with PD specific HEP    Time  4    Period  Weeks    Status  New    Target Date  02/21/18      OT SHORT TERM GOAL #2   Title  Pt /wife will verbalize understanding of adapted strategies for ADLS/ IADLs to maximize safety and independence.    Time  4    Period  Weeks    Status  New      OT SHORT TERM GOAL #3   Title  Pt will perfom LB dressing with min A    Baseline  max A    Time  4     Period  Weeks    Status  New      OT SHORT TERM GOAL #4   Title  Pt will demonstrate improved fine motor coordination as evidenced by decreasing 9 hole pegs test by 4 secs bilaterally.    Baseline  RUE 68.66 secs, LUE 68.82 secs    Time  4    Period  Weeks    Status  New      OT SHORT TERM GOAL #5   Title  Pt/ wife will verbalize understanding of compensatory strategies for memory/ cognition    Time  4    Period  Weeks    Status  New        OT Long Term Goals - 01/22/18 1246      OT LONG TERM GOAL #1   Title  Pt and wife will verbalize understanding of ways to prevent future complications and appropriate community resources    Time  12    Period  Weeks    Status  New    Target Date  04/22/18      OT LONG TERM GOAL #2   Title  Pt will demonstrate aiblity to retrieve a lightweight object at 115 shoulder flexion and -35 elbow extension with RUE.    Time  12    Period  Weeks    Status  New      OT LONG TERM GOAL #3   Title  Pt.will demonstrate ability to retrieve a lightweight obect at 120 shoulder flexion with LUE.    Time  12    Period  Weeks    Status  New      OT LONG TERM GOAL #4   Title  Pt will demonstrate improved gross motor coordination/ bilateral UE functional use for ADLs as evidenced by increasing box/ blocks score bilaterally by 3 blocks    Baseline  RUE 26 blocks, LUe 24 blocks     Time  12    Period  Weeks    Status  New      OT LONG TERM GOAL #5   Title  Pt will demonstrae ability to fasten 3 buttons in 75 secs or less    Time  12    Period  Weeks    Status  New            Plan - 02/11/18 1713    Clinical Impression Statement  Pt is  progressing towards goals. He benefits from repetition of exercises/ stretches due to cognitive deficits.     Occupational Profile and client history currently impacting functional performance  Pt was independent with ADLS prior to his triceps tear in February, now he requires assistance with ADLS and has had  several falls.WFU:[id number] disease(diagnosed with parkinsonism 2012)triceps tear Feb 2019,R TKR 2016, L TKR 2017, anxiety, arthritis    Occupational performance deficits (Please refer to evaluation for details):  IADL's;ADL's;Leisure;Social Participation    Rehab Potential  Fair    Current Impairments/barriers affecting progress:  length of time since onset, severity of deficits, cognitive deficits    OT Frequency  2x / week    OT Duration  12 weeks    OT Treatment/Interventions  Self-care/ADL training;Therapeutic exercise;Gait Training;Aquatic Therapy;Moist Heat;Paraffin;Neuromuscular education;Balance training;Patient/family education;Therapeutic activities;Functional Mobility Training;Fluidtherapy;Cryotherapy;Ultrasound;Contrast Bath;DME and/or AE instruction;Manual Therapy;Passive range of motion;Cognitive remediation/compensation    Plan  ADL strategy education with family    OT Home Exercise Plan  Education provided:  Basic PD Coordination HEP, ball ex in seated     Consulted and Agree with Plan of Care  Patient       Patient will benefit from skilled therapeutic intervention in order to improve the following deficits and impairments:  Abnormal gait, Decreased cognition, Impaired flexibility, Pain, Decreased coordination, Decreased mobility, Decreased strength, Decreased range of motion, Decreased activity tolerance, Decreased balance, Decreased endurance, Decreased knowledge of precautions, Decreased safety awareness, Difficulty walking, Impaired perceived functional ability, Impaired UE functional use  Visit Diagnosis: Other symptoms and signs involving the nervous system  Abnormal posture  Muscle weakness (generalized)  Other lack of coordination  Frontal lobe and executive function deficit  Other abnormalities of gait and mobility    Problem List Patient Active Problem List   Diagnosis Date Noted  . Rupture of right triceps tendon 05/20/2017  . Triceps tendon rupture,  right, initial encounter 05/20/2017  . Atrial fibrillation ([place]) [I48.91] 02/05/2017  . Urinary retention 12/31/2015  . Acute renal failure (ARF) ([place]) 12/31/2015  . Acute encephalopathy 12/31/2015  . Primary osteoarthritis of right knee 07/12/2015  . Primary osteoarthritis of left knee 11/30/2014  . Encounter for therapeutic drug monitoring 06/21/2013  . Edema of extremities 02/04/2013  . Chronic anticoagulation 02/04/2013  . GERD (gastroesophageal reflux disease)   . Restrictive lung disease   . CKD (chronic kidney disease) stage 2, GFR 60-89 ml/min   . Cellulitis   . Allergic rhinitis   . Anxiety   . Long term (current) use of anticoagulants 01/25/2013  . Parkinsonism ([place]) 06/22/2012  . Paresthesias 06/22/2012  . PULMONARY NODULE 06/02/2008  . SNORING 06/02/2008  . Hyperlipidemia 05/15/2008  . Essential hypertension 05/15/2008  . PULMONARY FUNCTION TESTS, ABNORMAL 05/15/2008    , 02/11/2018, 5:15 PM  [facility] [address] [place], [place], [address] Phone: [phone]   Fax:  [phone]  Name: Brian [name] MRN: [id number] Date of Birth: July 14, 1940

## 2018-02-11 NOTE — Therapy (Signed)
[facility] [address] [place], [place], [address] Phone: [phone]   Fax:  [phone]  Speech Language Pathology Treatment  Patient Details  Name: Brian [name] MRN: [id number] Date of Birth: [birth year] Referring Provider (SLP): Star Age, MD   Encounter Date: 02/11/2018  End of Session - 02/11/18 0907    Visit Number  4    Number of Visits  17    Date for SLP Re-Evaluation  04/21/18    SLP Start Time  0803    SLP Stop Time   0845    SLP Time Calculation (min)  42 min    Activity Tolerance  Patient tolerated treatment well       Past Medical History:  Diagnosis Date  . Abnormal PFT 1. 05/18/08  2. 11/30/08   1. Showed mild airflow obstruction, mild restriction, mild diffusion defect; FEV1 2.22(64%), FVC 3.33(65%), FEVi% 67, TLC 5.19(69%), DLCO 77%, +BD  2. FEV1 2.38(73%), FVC 3.81(80%), FEV1% 63, TLC 5.61(80%), DLCO 79%, no BD  . Allergic rhinitis   . Anxiety   . BPH (benign prostatic hyperplasia)   . Cellulitis    right leg MRSA  . Erectile dysfunction   . GERD (gastroesophageal reflux disease)   . Heart murmur    mild MR by echo  . Hyperlipidemia   . Hypertension   . Osteoarthritis   . Paresthesias 06/22/2012  . Parkinsonism ([place]) 06/22/2012  . Permanent atrial fibrillation   . Restrictive lung disease     Past Surgical History:  Procedure Laterality Date  . CARDIAC CATHETERIZATION  11/2009   normal coronary arteries  . CARDIOVERSION     multiple  . CATARACT EXTRACTION    . COLONOSCOPY    . COLONOSCOPY WITH PROPOFOL N/A 09/02/2016   Procedure: COLONOSCOPY WITH PROPOFOL;  Surgeon: Garlan Fair, MD;  Location: [facility];  Service: Endoscopy;  Laterality: N/A;  . EYE SURGERY     bilateral cataract extraction  . JOINT REPLACEMENT    . TOTAL KNEE ARTHROPLASTY Left 11/30/2014   Procedure: TOTAL KNEE ARTHROPLASTY;  Surgeon: Susa Day, MD;  Location: [facility];  Service: Orthopedics;   Laterality: Left;  . TOTAL KNEE ARTHROPLASTY Right 07/12/2015   Procedure: RIGHT TOTAL KNEE ARTHROPLASTY;  Surgeon: Susa Day, MD;  Location: [facility];  Service: Orthopedics;  Laterality: Right;  . TRICEPS TENDON REPAIR Right 05/20/2017  . TRICEPS TENDON REPAIR Right 05/20/2017   Procedure: TRICEPS TENDON REPAIR;  Surgeon: Iran Planas, MD;  Location: [facility];  Service: Orthopedics;  Laterality: Right;    There were no vitals filed for this visit.  Subjective Assessment - 02/11/18 0803    Subjective  "I'm --- ready for the opera."    Currently in Pain?  No/denies            ADULT SLP TREATMENT - 02/11/18 0813      General Information   Behavior/Cognition  Alert;Cooperative      Treatment Provided   Treatment provided  Cognitive-Linquistic      Cognitive-Linquistic Treatment   Treatment focused on  Dysarthria    Skilled Treatment  Pt with hypophonia, hypoarticulation when entering ST room (average low-mid 60s), although pt making definite effort for louder speech intermittently during the 3 minutes of conversation. Loud /a/ was used for recalibration of loudness in conversation, with average of upper 80s-lower 90s dB with occasional min A for loudness and usual min-mod A for opening mouth. Pt with anterior labial leakage as well and was  cued to swallow prior to each rep. Helpful cue for full breath was telling pt to "feel a little tightness in your chest". SLP re-educated pt on rationale for loud /a/ as he was completing mostly once/day. In word and phrase level reading tasks, pt averaged mid 70s dB with rare min A for loudness. As cognitive load added, pt required incr'd level and frequency cueing for loudness. Pt with specific difficulty with loudness fade. Pt again demo'd awareness of softer speech- making 4 self corrections during structured tasks and in conversation throughout session. Usual mod-max cues required for carryover in conversation between tasks. SLP reminded pt that he  must feel like he is shouting and explaiend rationale for this.       Assessment / Recommendations / Plan   Plan  Continue with current plan of care      Progression Toward Goals   Progression toward goals  Progressing toward goals       SLP Education - 02/11/18 0907    Education Details  must feel like he is shouting, and rationale for this    Person(s) Educated  Patient    Methods  Explanation    Comprehension  Verbalized understanding       SLP Short Term Goals - 02/11/18 0909      SLP SHORT TERM GOAL #1   Title  Pt will generate loud /a/ with average of upper 80s dB over 3 sessions    Baseline  02-11-18    Time  3    Period  Weeks    Status  On-going      SLP SHORT TERM GOAL #2   Title  Pt will use abdominal breathing at rest 85% success over 2 sessions    Time  3    Period  Weeks    Status  On-going      SLP SHORT TERM GOAL #3   Title  Pt will use speech volume average low 70sdB when responding with sentences 18/20 over two sessions    Time  3    Period  Weeks    Status  On-going      SLP SHORT TERM GOAL #4   Title  pt will tell SLP 3 anomia strategies over three sessions    Time  3    Period  Weeks    Status  On-going       SLP Long Term Goals - 02/11/18 0910      SLP LONG TERM GOAL #1   Title  pt will generate loud /a/ average low 90s dB over 6 sessions    Baseline  02-11-18    Time  7   or 17 visits, for all LTGs   Period  Weeks   or 17 visits, for all LTGs   Status  On-going      SLP LONG TERM GOAL #2   Title  Pt will use abdominal breathing in sentences or short conversation 80% success over 2 sessions    Time  7    Period  Weeks    Status  On-going      SLP LONG TERM GOAL #3   Title  pt will use anomia compensations in 75% of opportunities in sentence responses, with min verbal cues to use them, over 3 sessions    Time  7    Period  Weeks    Status  On-going      SLP LONG TERM GOAL #4   Title  pt will undergo  objective swallow eval PRN     Time  3    Period  Weeks    Status  On-going       Plan - 02/11/18 0908    Clinical Impression Statement  Pt presents with dysarthria including intermittent reduced speech volume (especially with longer utterances and as conversation progresses). He showed ability to self-correct today, adn made an effort in free ocnversation prior to loud /a/ to incr his volume of conversational speech. Pt may be administered the [employer] in subsequent session/s.  Pt may require a modified barium swallow or FEES at some point during this therapy course. He would benefit from skilled ST addressing these skills to communicate more efficiently.     Speech Therapy Frequency  2x / week    Duration  --   8 weeks or 17 visits   Potential to Achieve Goals  Good       Patient will benefit from skilled therapeutic intervention in order to improve the following deficits and impairments:   Dysarthria and anarthria  Dysphagia, unspecified type    Problem List Patient Active Problem List   Diagnosis Date Noted  . Rupture of right triceps tendon 05/20/2017  . Triceps tendon rupture, right, initial encounter 05/20/2017  . Atrial fibrillation ([place]) [I48.91] 02/05/2017  . Urinary retention 12/31/2015  . Acute renal failure (ARF) ([place]) 12/31/2015  . Acute encephalopathy 12/31/2015  . Primary osteoarthritis of right knee 07/12/2015  . Primary osteoarthritis of left knee 11/30/2014  . Encounter for therapeutic drug monitoring 06/21/2013  . Edema of extremities 02/04/2013  . Chronic anticoagulation 02/04/2013  . GERD (gastroesophageal reflux disease)   . Restrictive lung disease   . CKD (chronic kidney disease) stage 2, GFR 60-89 ml/min   . Cellulitis   . Allergic rhinitis   . Anxiety   . Long term (current) use of anticoagulants 01/25/2013  . Parkinsonism ([place]) 06/22/2012  . Paresthesias 06/22/2012  . PULMONARY NODULE 06/02/2008  . SNORING 06/02/2008  . Hyperlipidemia 05/15/2008  .  Essential hypertension 05/15/2008  . PULMONARY FUNCTION TESTS, ABNORMAL 05/15/2008    [facility] ,MS, CCC-SLP  02/11/2018, 9:11 AM  [facility] [address] [place], [place], [address] Phone: [phone]   Fax:  [phone]   Name: Brian [name] MRN: [id number] Date of Birth: 1940/04/05

## 2018-02-12 ENCOUNTER — Ambulatory Visit: Payer: Medicare Other | Admitting: Physical Therapy

## 2018-02-12 ENCOUNTER — Ambulatory Visit: Payer: Medicare Other

## 2018-02-12 ENCOUNTER — Encounter: Payer: Self-pay | Admitting: Physical Therapy

## 2018-02-12 ENCOUNTER — Ambulatory Visit: Payer: Medicare Other | Admitting: Occupational Therapy

## 2018-02-12 DIAGNOSIS — R41844 Frontal lobe and executive function deficit: Secondary | ICD-10-CM

## 2018-02-12 DIAGNOSIS — R2689 Other abnormalities of gait and mobility: Secondary | ICD-10-CM

## 2018-02-12 DIAGNOSIS — R29818 Other symptoms and signs involving the nervous system: Secondary | ICD-10-CM

## 2018-02-12 DIAGNOSIS — M6281 Muscle weakness (generalized): Secondary | ICD-10-CM

## 2018-02-12 DIAGNOSIS — R293 Abnormal posture: Secondary | ICD-10-CM

## 2018-02-12 DIAGNOSIS — R278 Other lack of coordination: Secondary | ICD-10-CM | POA: Diagnosis not present

## 2018-02-12 DIAGNOSIS — R2681 Unsteadiness on feet: Secondary | ICD-10-CM | POA: Diagnosis not present

## 2018-02-12 DIAGNOSIS — R471 Dysarthria and anarthria: Secondary | ICD-10-CM

## 2018-02-12 DIAGNOSIS — R1310 Dysphagia, unspecified: Secondary | ICD-10-CM

## 2018-02-12 NOTE — Patient Instructions (Addendum)
HIP: Hamstrings - Short Sitting    Scoot out to the edge of the chair.  Rest leg on raised surface. Keep knee straight. Lift chest. Hold __30_ seconds. __3_ reps per set, __3_ sets per day  Copyright  VHI. All rights reserved.  PELVIC TILT: Anterior    Scoot out to the edge of the chair.  Start in slumped position. Roll pelvis forward to arch back to sit tall. __5-10_ reps per set, _3__ sets per day.  Copyright  VHI. All rights reserved.  Ankle Bend    Point toes down and then up.  Hold each position 3 seconds, bending ankles as far as possible. Repeat ___10_ times. Do __3__ sessions per day.  http://gt2.exer.us/611   Copyright  VHI. All rights reserved.

## 2018-02-12 NOTE — Patient Instructions (Signed)
Continue loud "ah" at home.   Focus on louder speech whenever you speak.

## 2018-02-12 NOTE — Therapy (Signed)
[facility] [address] [place], [place], [address] Phone: [phone]   Fax:  [phone]  Occupational Therapy Treatment  Patient Details  Name: Brian [name] MRN: [id number] Date of Birth: October 28, 1940 Referring Provider (OT): Dr. Rexene Alberts   Encounter Date: 02/12/2018  OT End of Session - 02/12/18 1722    Visit Number  5    Number of Visits  25    Date for OT Re-Evaluation  04/22/18    Authorization Type  Medicare    Authorization Time Period  cert 12 weeks, anticipate d/c following 8 weeks    Authorization - Visit Number  5    Authorization - Number of Visits  10    OT Start Time  0850    OT Stop Time  0930    OT Time Calculation (min)  40 min    Activity Tolerance  Patient tolerated treatment well    Behavior During Therapy  [facility] for tasks assessed/performed;Flat affect       Past Medical History:  Diagnosis Date  . Abnormal PFT 1. 05/18/08  2. 11/30/08   1. Showed mild airflow obstruction, mild restriction, mild diffusion defect; FEV1 2.22(64%), FVC 3.33(65%), FEVi% 67, TLC 5.19(69%), DLCO 77%, +BD  2. FEV1 2.38(73%), FVC 3.81(80%), FEV1% 63, TLC 5.61(80%), DLCO 79%, no BD  . Allergic rhinitis   . Anxiety   . BPH (benign prostatic hyperplasia)   . Cellulitis    right leg MRSA  . Erectile dysfunction   . GERD (gastroesophageal reflux disease)   . Heart murmur    mild MR by echo  . Hyperlipidemia   . Hypertension   . Osteoarthritis   . Paresthesias 06/22/2012  . Parkinsonism ([place]) 06/22/2012  . Permanent atrial fibrillation   . Restrictive lung disease     Past Surgical History:  Procedure Laterality Date  . CARDIAC CATHETERIZATION  11/2009   normal coronary arteries  . CARDIOVERSION     multiple  . CATARACT EXTRACTION    . COLONOSCOPY    . COLONOSCOPY WITH PROPOFOL N/A 09/02/2016   Procedure: COLONOSCOPY WITH PROPOFOL;  Surgeon: Garlan Fair, MD;  Location: [facility];  Service: Endoscopy;   Laterality: N/A;  . EYE SURGERY     bilateral cataract extraction  . JOINT REPLACEMENT    . TOTAL KNEE ARTHROPLASTY Left 11/30/2014   Procedure: TOTAL KNEE ARTHROPLASTY;  Surgeon: Susa Day, MD;  Location: [facility];  Service: Orthopedics;  Laterality: Left;  . TOTAL KNEE ARTHROPLASTY Right 07/12/2015   Procedure: RIGHT TOTAL KNEE ARTHROPLASTY;  Surgeon: Susa Day, MD;  Location: [facility];  Service: Orthopedics;  Laterality: Right;  . TRICEPS TENDON REPAIR Right 05/20/2017  . TRICEPS TENDON REPAIR Right 05/20/2017   Procedure: TRICEPS TENDON REPAIR;  Surgeon: Iran Planas, MD;  Location: [facility];  Service: Orthopedics;  Laterality: Right;    There were no vitals filed for this visit.  Subjective Assessment - 02/12/18 1719    Patient Stated Goals  to be as independent as he can, to be able dress himself    Currently in Pain?  No/denies                  Treatment: Pt's wife present, education provided see pt instructions.         OT Education - 02/12/18 1719    Education Details  PWR! supine basic 4, simplified PWR! step, 10 reps each, increased time, mod v.c/ facilitation, followed by education regarding bed mobility with pt/  wife    Person(s) Educated  Patient;Spouse    Methods  Explanation;Demonstration;Verbal cues;Handout    Comprehension  Verbalized understanding;Returned demonstration;Verbal cues required;Need further instruction       OT Short Term Goals - 01/22/18 1242      OT SHORT TERM GOAL #1   Title  Pt/ caregeiver willl be I with PD specific HEP    Time  4    Period  Weeks    Status  New    Target Date  02/21/18      OT SHORT TERM GOAL #2   Title  Pt /wife will verbalize understanding of adapted strategies for ADLS/ IADLs to maximize safety and independence.    Time  4    Period  Weeks    Status  New      OT SHORT TERM GOAL #3   Title  Pt will perfom LB dressing with min A    Baseline  max A    Time  4    Period  Weeks    Status  New       OT SHORT TERM GOAL #4   Title  Pt will demonstrate improved fine motor coordination as evidenced by decreasing 9 hole pegs test by 4 secs bilaterally.    Baseline  RUE 68.66 secs, LUE 68.82 secs    Time  4    Period  Weeks    Status  New      OT SHORT TERM GOAL #5   Title  Pt/ wife will verbalize understanding of compensatory strategies for memory/ cognition    Time  4    Period  Weeks    Status  New        OT Long Term Goals - 01/22/18 1246      OT LONG TERM GOAL #1   Title  Pt and wife will verbalize understanding of ways to prevent future complications and appropriate community resources    Time  12    Period  Weeks    Status  New    Target Date  04/22/18      OT LONG TERM GOAL #2   Title  Pt will demonstrate aiblity to retrieve a lightweight object at 115 shoulder flexion and -35 elbow extension with RUE.    Time  12    Period  Weeks    Status  New      OT LONG TERM GOAL #3   Title  Pt.will demonstrate ability to retrieve a lightweight obect at 120 shoulder flexion with LUE.    Time  12    Period  Weeks    Status  New      OT LONG TERM GOAL #4   Title  Pt will demonstrate improved gross motor coordination/ bilateral UE functional use for ADLs as evidenced by increasing box/ blocks score bilaterally by 3 blocks    Baseline  RUE 26 blocks, LUe 24 blocks     Time  12    Period  Weeks    Status  New      OT LONG TERM GOAL #5   Title  Pt will demonstrae ability to fasten 3 buttons in 75 secs or less    Time  12    Period  Weeks    Status  New            Plan - 02/12/18 1721    Clinical Impression Statement  Pt is progressing towards goals. Pt/ wife were instructed in  PWR! exercises in supine, and how to apply them to bed mobility, pt /wife verbalize understanding.    Occupational Profile and client history currently impacting functional performance  Pt was independent with ADLS prior to his triceps tear in February, now he requires assistance with ADLS and  has had several falls.UJW:[id number]'F disease(diagnosed with parkinsonism 2012)triceps tear Feb 2019,R TKR 2016, L TKR 2017, anxiety, arthritis    Occupational performance deficits (Please refer to evaluation for details):  IADL's;ADL's;Leisure;Social Participation    Rehab Potential  Fair    Current Impairments/barriers affecting progress:  length of time since onset, severity of deficits, cognitive deficits    OT Frequency  2x / week    OT Duration  12 weeks    Plan  ADL strategy education with family    OT Home Exercise Plan  Education provided:  Basic PD Coordination HEP, ball ex in seated , PWR! supine- bed mobility    Consulted and Agree with Plan of Care  Patient;Family member/caregiver    Family Member Consulted  wife       Patient will benefit from skilled therapeutic intervention in order to improve the following deficits and impairments:  Abnormal gait, Decreased cognition, Impaired flexibility, Pain, Decreased coordination, Decreased mobility, Decreased strength, Decreased range of motion, Decreased activity tolerance, Decreased balance, Decreased endurance, Decreased knowledge of precautions, Decreased safety awareness, Difficulty walking, Impaired perceived functional ability, Impaired UE functional use  Visit Diagnosis: Other symptoms and signs involving the nervous system  Abnormal posture  Muscle weakness (generalized)  Other lack of coordination  Frontal lobe and executive function deficit    Problem List Patient Active Problem List   Diagnosis Date Noted  . Rupture of right triceps tendon 05/20/2017  . Triceps tendon rupture, right, initial encounter 05/20/2017  . Atrial fibrillation ([place]) [I48.91] 02/05/2017  . Urinary retention 12/31/2015  . Acute renal failure (ARF) ([place]) 12/31/2015  . Acute encephalopathy 12/31/2015  . Primary osteoarthritis of right knee 07/12/2015  . Primary osteoarthritis of left knee 11/30/2014  . Encounter for therapeutic drug  monitoring 06/21/2013  . Edema of extremities 02/04/2013  . Chronic anticoagulation 02/04/2013  . GERD (gastroesophageal reflux disease)   . Restrictive lung disease   . CKD (chronic kidney disease) stage 2, GFR 60-89 ml/min   . Cellulitis   . Allergic rhinitis   . Anxiety   . Long term (current) use of anticoagulants 01/25/2013  . Parkinsonism ([place]) 06/22/2012  . Paresthesias 06/22/2012  . PULMONARY NODULE 06/02/2008  . SNORING 06/02/2008  . Hyperlipidemia 05/15/2008  . Essential hypertension 05/15/2008  . PULMONARY FUNCTION TESTS, ABNORMAL 05/15/2008    Shafin Pollio 02/12/2018, 5:23 PM  [facility] [address] [place], [place], [address] Phone: [phone]   Fax:  [phone]  Name: Brian [name] MRN: [id number] Date of Birth: 10/01/1940

## 2018-02-12 NOTE — Therapy (Signed)
[facility] [address] [facility] [place], [place], [address] Phone: [phone]   Fax:  [phone]  Physical Therapy Treatment  Patient Details  Name: Brian [name] MRN: [id number] Date of Birth: October 01, 1940 Referring Provider (PT): Rexene Alberts   Encounter Date: 02/12/2018  PT End of Session - 02/12/18 1838    Visit Number  5    Number of Visits  18    Date for PT Re-Evaluation  04/21/18    Authorization Type  Medicare and [place] [facility]    PT Start Time  [id number]    PT Stop Time  0931    PT Time Calculation (min)  41 min    Activity Tolerance  Patient tolerated treatment well    Behavior During Therapy  [facility] for tasks assessed/performed;Flat affect       Past Medical History:  Diagnosis Date  . Abnormal PFT 1. 05/18/08  2. 11/30/08   1. Showed mild airflow obstruction, mild restriction, mild diffusion defect; FEV1 2.22(64%), FVC 3.33(65%), FEVi% 67, TLC 5.19(69%), DLCO 77%, +BD  2. FEV1 2.38(73%), FVC 3.81(80%), FEV1% 63, TLC 5.61(80%), DLCO 79%, no BD  . Allergic rhinitis   . Anxiety   . BPH (benign prostatic hyperplasia)   . Cellulitis    right leg MRSA  . Erectile dysfunction   . GERD (gastroesophageal reflux disease)   . Heart murmur    mild MR by echo  . Hyperlipidemia   . Hypertension   . Osteoarthritis   . Paresthesias 06/22/2012  . Parkinsonism ([place]) 06/22/2012  . Permanent atrial fibrillation   . Restrictive lung disease     Past Surgical History:  Procedure Laterality Date  . CARDIAC CATHETERIZATION  11/2009   normal coronary arteries  . CARDIOVERSION     multiple  . CATARACT EXTRACTION    . COLONOSCOPY    . COLONOSCOPY WITH PROPOFOL N/A 09/02/2016   Procedure: COLONOSCOPY WITH PROPOFOL;  Surgeon: Garlan Fair, MD;  Location: [facility];  Service: Endoscopy;  Laterality: N/A;  . EYE SURGERY     bilateral cataract extraction  . JOINT REPLACEMENT    . TOTAL KNEE ARTHROPLASTY Left 11/30/2014   Procedure: TOTAL  KNEE ARTHROPLASTY;  Surgeon: Susa Day, MD;  Location: [facility];  Service: Orthopedics;  Laterality: Left;  . TOTAL KNEE ARTHROPLASTY Right 07/12/2015   Procedure: RIGHT TOTAL KNEE ARTHROPLASTY;  Surgeon: Susa Day, MD;  Location: [facility];  Service: Orthopedics;  Laterality: Right;  . TRICEPS TENDON REPAIR Right 05/20/2017  . TRICEPS TENDON REPAIR Right 05/20/2017   Procedure: TRICEPS TENDON REPAIR;  Surgeon: Iran Planas, MD;  Location: [facility];  Service: Orthopedics;  Laterality: Right;    There were no vitals filed for this visit.  Subjective Assessment - 02/12/18 0855    Subjective  No falls since last visit.  No pain today.    Pertinent History  Fall February 2019 with tricep tendon tear, s/p 3 months [facility] and then [facility] therapies, L TKR 9/216, R TKR 06/2015    Patient Stated Goals  Pt's goal for PT is to drive car again, to help with balance.    Currently in Pain?  No/denies                       [facility] Adult PT Treatment/Exercise - 02/12/18 0001      Transfers   Transfers  Sit to Stand;Stand to Sit    Sit to Stand  4: Min guard  Sit to Stand Details  Verbal cues for sequencing;Verbal cues for technique    Stand to Sit  4: Min guard    Number of Reps  2 sets;Other reps (comment)   5 reps, cues for terminal knee extension in standing   Comments  With turning to sit, cues for marching in place with increased intensity for foot clearance, weightshifting to complete turn      Ambulation/Gait   Ambulation/Gait  Yes    Ambulation/Gait Assistance  5: Supervision;4: Min guard    Ambulation/Gait Assistance Details  Cues to "kick" RLE, to get started with gait    Ambulation Distance (Feet)  100 Feet   20 ft x 4   Assistive device  Rolling walker    Gait Pattern  Decreased step length - right;Decreased step length - left;Right flexed knee in stance;Shuffle;Festinating;Trunk flexed;Narrow base of support;Poor foot clearance - right      Neuro Re-ed     Neuro Re-ed Details   At counter, trialed modified PWR! standing moves-push into surface of counter, then upright posture to stand x 8 reps, then wide BOS anterior/posterior weightshift with facilitation x 8 reps, then lateral weightshifting x 10 reps with facilitation; then forward/diagonal step x 5 reps each side.  Cues throughout for upright posture, for larger amplitude movement patterns.      Exercises   Exercises  Knee/Hip;Ankle      Knee/Hip Exercises: Stretches   Active Hamstring Stretch  Right;Left;3 reps;30 seconds   foot propped on block   Active Hamstring Stretch Limitations  tactile and verbal cues (to pt and wife) for upright posture and slight forward lean for optimal stretch    Other Knee/Hip Stretches  Seated anterior/posterior pelvic tilt x 10 reps, with facilitation at low back to initiate anterior pelvic tilt      Ankle Exercises: Seated   Toe Raise  10 reps;3 seconds             PT Education - 02/12/18 1837    Education Details  HEP instructions-see instructions    Person(s) Educated  Patient;Spouse    Methods  Explanation;Demonstration;Handout;Tactile cues;Verbal cues    Comprehension  Verbalized understanding;Returned demonstration;Verbal cues required;Need further instruction   Cues to pt/wife for correct positioning      PT Short Term Goals - 01/22/18 1242      PT SHORT TERM GOAL #1   Title  Pt will perform HEP with wife's supervision for improved posture, transfers, balance, gait.  TARGET 02/26/18    Time  5    Period  Weeks    Status  New    Target Date  02/26/18      PT SHORT TERM GOAL #2   Title  Pt will perform at least 6 of 10 reps of sit<>stand with minimal UE support for improved transfer efficiency and safety.    Time  5    Period  Weeks    Status  New    Target Date  02/26/18      PT SHORT TERM GOAL #3   Title  Pt will improve TUG score to less than or equal to 70 seconds for decreased fall risk.    Time  5    Period  Weeks     Status  New    Target Date  02/26/18      PT SHORT TERM GOAL #4   Title  Pt/wife will demonstrate understanding of tips to reduce freezing with gait and turns.    Time  5    Period  Weeks    Status  New    Target Date  02/26/18        PT Long Term Goals - 01/22/18 1247      PT LONG TERM GOAL #1   Title  Pt/wife will verbalize understanding of fall prevention to decrease fall risk in home environment.  TARGET 03/26/2018    Time  9    Period  Weeks    Status  New    Target Date  03/26/18      PT LONG TERM GOAL #2   Title  Pt will perform at least 10 reps of sit<>stand with minimal to no UE support, modified independently for improved transfer efficiency and safety.    Time  9    Period  Weeks    Status  New    Target Date  03/26/18      PT LONG TERM GOAL #3   Title  Pt will improve TUG score to less than or equal to 50 seconds for decreased fall risk.    Time  9    Period  Weeks    Status  New    Target Date  03/26/18      PT LONG TERM GOAL #4   Title  Pt will improve gait velocity to at least 1.8 ft/sec for decreased fall risk, improved gait efficiency and safety.    Time  9    Period  Weeks    Status  New    Target Date  03/26/18      PT LONG TERM GOAL #5   Title  Berg balance test to be assessed, with goal to be written as appropriate.    Time  9    Period  Weeks    Status  New    Target Date  03/26/18            Plan - 02/12/18 1838    Clinical Impression Statement  Pt has missed scheduling last week due to wife's work schedule and earlier this week due to primary PT out with sick child.  Returns today, with skilled PT session focusing on gait training (with RW today that pt brought in), transfers training, strengthening, flexibility exercises.  Attempted standing (modified) PWR! Moves at counter, with pt needing max facilitation to perform correctly.  Pt will continue to benefit from skilled PT to address stregnth, balance, and gait for improved mobility.     Rehab Potential  Good    Clinical Impairments Affecting Rehab Potential  supportive wife; cognition/severity of PD deficits    PT Frequency  2x / week    PT Duration  Other (comment)   9 weeks   PT Treatment/Interventions  ADLs/Self Care Home Management;DME Instruction;Gait training;Stair training;Functional mobility training;Therapeutic activities;Therapeutic exercise;Balance training;Patient/family education;Neuromuscular re-education    PT Next Visit Plan  Review stretches/ exercises added to HEP; transfers focusing on terminal knee extension; standing (modified PWR! Moves) to increase step length and posture; gait training for increased step length and posture.    Consulted and Agree with Plan of Care  Patient;Family member/caregiver    Family Member Consulted  wife, [name]        Patient will benefit from skilled therapeutic intervention in order to improve the following deficits and impairments:  Abnormal gait, Decreased balance, Decreased coordination, Decreased mobility, Decreased safety awareness, Difficulty walking, Decreased strength, Impaired flexibility, Postural dysfunction  Visit Diagnosis: Other abnormalities of gait and mobility  Abnormal posture  Muscle  weakness (generalized)     Problem List Patient Active Problem List   Diagnosis Date Noted  . Rupture of right triceps tendon 05/20/2017  . Triceps tendon rupture, right, initial encounter 05/20/2017  . Atrial fibrillation ([place]) [I48.91] 02/05/2017  . Urinary retention 12/31/2015  . Acute renal failure (ARF) ([place]) 12/31/2015  . Acute encephalopathy 12/31/2015  . Primary osteoarthritis of right knee 07/12/2015  . Primary osteoarthritis of left knee 11/30/2014  . Encounter for therapeutic drug monitoring 06/21/2013  . Edema of extremities 02/04/2013  . Chronic anticoagulation 02/04/2013  . GERD (gastroesophageal reflux disease)   . Restrictive lung disease   . CKD (chronic kidney disease) stage 2, GFR 60-89  ml/min   . Cellulitis   . Allergic rhinitis   . Anxiety   . Long term (current) use of anticoagulants 01/25/2013  . Parkinsonism ([place]) 06/22/2012  . Paresthesias 06/22/2012  . PULMONARY NODULE 06/02/2008  . SNORING 06/02/2008  . Hyperlipidemia 05/15/2008  . Essential hypertension 05/15/2008  . PULMONARY FUNCTION TESTS, ABNORMAL 05/15/2008    Jaycey Gens W. 02/12/2018, 6:43 PM  Frazier Butt., PT   [facility] [address] [place], [place], [address] Phone: [phone]   Fax:  [phone]  Name: Brian [name] MRN: [id number] Date of Birth: [birth year]

## 2018-02-12 NOTE — Therapy (Signed)
[facility] [address] [place], [place], [address] Phone: [phone]   Fax:  [phone]  Speech Language Pathology Treatment  Patient Details  Name: Brian [name] MRN: [id number] Date of Birth: 02/02/1941 Referring Provider (SLP): Star Age, MD   Encounter Date: 02/12/2018  End of Session - 02/12/18 1019    Visit Number  5    Number of Visits  17    Date for SLP Re-Evaluation  04/21/18    SLP Start Time  0805    SLP Stop Time   0846    SLP Time Calculation (min)  41 min    Activity Tolerance  Patient tolerated treatment well       Past Medical History:  Diagnosis Date  . Abnormal PFT 1. 05/18/08  2. 11/30/08   1. Showed mild airflow obstruction, mild restriction, mild diffusion defect; FEV1 2.22(64%), FVC 3.33(65%), FEVi% 67, TLC 5.19(69%), DLCO 77%, +BD  2. FEV1 2.38(73%), FVC 3.81(80%), FEV1% 63, TLC 5.61(80%), DLCO 79%, no BD  . Allergic rhinitis   . Anxiety   . BPH (benign prostatic hyperplasia)   . Cellulitis    right leg MRSA  . Erectile dysfunction   . GERD (gastroesophageal reflux disease)   . Heart murmur    mild MR by echo  . Hyperlipidemia   . Hypertension   . Osteoarthritis   . Paresthesias 06/22/2012  . Parkinsonism ([place]) 06/22/2012  . Permanent atrial fibrillation   . Restrictive lung disease     Past Surgical History:  Procedure Laterality Date  . CARDIAC CATHETERIZATION  11/2009   normal coronary arteries  . CARDIOVERSION     multiple  . CATARACT EXTRACTION    . COLONOSCOPY    . COLONOSCOPY WITH PROPOFOL N/A 09/02/2016   Procedure: COLONOSCOPY WITH PROPOFOL;  Surgeon: Garlan Fair, MD;  [facility];  Service: Endoscopy;  Laterality: N/A;  . EYE SURGERY     bilateral cataract extraction  . JOINT REPLACEMENT    . TOTAL KNEE ARTHROPLASTY Left 11/30/2014   Procedure: TOTAL KNEE ARTHROPLASTY;  Surgeon: Susa Day, MD;  [facility];  Service: Orthopedics;   Laterality: Left;  . TOTAL KNEE ARTHROPLASTY Right 07/12/2015   Procedure: RIGHT TOTAL KNEE ARTHROPLASTY;  Surgeon: Susa Day, MD;  [facility];  Service: Orthopedics;  Laterality: Right;  . TRICEPS TENDON REPAIR Right 05/20/2017  . TRICEPS TENDON REPAIR Right 05/20/2017   Procedure: TRICEPS TENDON REPAIR;  Surgeon: Iran Planas, MD;  Location: [facility];  Service: Orthopedics;  Laterality: Right;    There were no vitals filed for this visit.  Subjective Assessment - 02/12/18 0820    Subjective  "I'm just glad to be here!"    Currently in Pain?  No/denies            ADULT SLP TREATMENT - 02/12/18 0826      General Information   Behavior/Cognition  Alert;Cooperative;Pleasant mood      Treatment Provided   Treatment provided  Cognitive-Linquistic      Cognitive-Linquistic Treatment   Treatment focused on  Dysarthria    Skilled Treatment  SLP used loud /a/ to assist in recalibration of conversational speech to WNL level: average upper 80s-low 90s dB. With structured sentence tasks, pt maintained an average of upper 60s-low 70s dB with cognitive load negatively impacting loudness. Pt with occasional self correction for loudness. In simple conversation of 8 minutes pt req'd min A occasionally for loudness, and rare rushes of speech were  noted. Pt self corrected during this time x3, successfully.       Assessment / Recommendations / Plan   Plan  Continue with current plan of care      Progression Toward Goals   Progression toward goals  Progressing toward goals       SLP Education - 02/12/18 1018    Education Details  need for bigger steps when walking not unlike the need for louder speech when talking    Person(s) Educated  Patient    Methods  Explanation    Comprehension  Verbalized understanding       SLP Short Term Goals - 02/12/18 1020      SLP SHORT TERM GOAL #1   Title  Pt will generate loud /a/ with average of upper 80s dB over 3 sessions    Baseline   02-11-18, 02-12-18    Time  3    Period  Weeks    Status  On-going      SLP SHORT TERM GOAL #2   Title  Pt will use abdominal breathing at rest 85% success over 2 sessions    Time  3    Period  Weeks    Status  On-going      SLP SHORT TERM GOAL #3   Title  Pt will use speech volume average low 70sdB when responding with sentences 18/20 over two sessions    Time  3    Period  Weeks    Status  On-going      SLP SHORT TERM GOAL #4   Title  pt will tell SLP 3 anomia strategies over three sessions    Time  3    Period  Weeks    Status  On-going       SLP Long Term Goals - 02/12/18 1020      SLP LONG TERM GOAL #1   Title  pt will generate loud /a/ average low 90s dB over 6 sessions    Baseline  02-11-18    Time  7   or 17 visits, for all LTGs   Period  Weeks   or 17 visits, for all LTGs   Status  On-going      SLP LONG TERM GOAL #2   Title  Pt will use abdominal breathing in sentences or short conversation 80% success over 2 sessions    Time  7    Period  Weeks    Status  On-going      SLP LONG TERM GOAL #3   Title  pt will use anomia compensations in 75% of opportunities in sentence responses, with min verbal cues to use them, over 3 sessions    Time  7    Period  Weeks    Status  On-going      SLP LONG TERM GOAL #4   Title  pt will undergo objective swallow eval PRN    Time  3    Period  Weeks    Status  On-going       Plan - 02/12/18 1019    Clinical Impression Statement  Pt cont to present with dysarthria including reduced speech volume (especially with longer utterances and when cognitive load increases). He showed ability to self-correct today both in structured tasks and in min complex conversation. Pt may be administered the [employer] in subsequent session/s.  Pt may require a modified barium swallow or FEES at some point during this therapy course. He would benefit from  skilled ST addressing these skills to communicate more efficiently.      Speech Therapy Frequency  2x / week    Duration  --   8 weeks or 17 visits   Potential to Achieve Goals  Good       Patient will benefit from skilled therapeutic intervention in order to improve the following deficits and impairments:   Dysarthria and anarthria  Dysphagia, unspecified type    Problem List Patient Active Problem List   Diagnosis Date Noted  . Rupture of right triceps tendon 05/20/2017  . Triceps tendon rupture, right, initial encounter 05/20/2017  . Atrial fibrillation ([place]) [I48.91] 02/05/2017  . Urinary retention 12/31/2015  . Acute renal failure (ARF) ([place]) 12/31/2015  . Acute encephalopathy 12/31/2015  . Primary osteoarthritis of right knee 07/12/2015  . Primary osteoarthritis of left knee 11/30/2014  . Encounter for therapeutic drug monitoring 06/21/2013  . Edema of extremities 02/04/2013  . Chronic anticoagulation 02/04/2013  . GERD (gastroesophageal reflux disease)   . Restrictive lung disease   . CKD (chronic kidney disease) stage 2, GFR 60-89 ml/min   . Cellulitis   . Allergic rhinitis   . Anxiety   . Long term (current) use of anticoagulants 01/25/2013  . Parkinsonism ([place]) 06/22/2012  . Paresthesias 06/22/2012  . PULMONARY NODULE 06/02/2008  . SNORING 06/02/2008  . Hyperlipidemia 05/15/2008  . Essential hypertension 05/15/2008  . PULMONARY FUNCTION TESTS, ABNORMAL 05/15/2008    [facility] ,MS, CCC-SLP  02/12/2018, 10:23 AM  [facility] [address] [place], [place], [address] Phone: [phone]   Fax:  [phone]   Name: Brian [name] MRN: [id number] Date of Birth: 11/16/1940

## 2018-02-17 ENCOUNTER — Ambulatory Visit: Payer: Medicare Other | Admitting: Physical Therapy

## 2018-02-17 ENCOUNTER — Ambulatory Visit (INDEPENDENT_AMBULATORY_CARE_PROVIDER_SITE_OTHER): Payer: Medicare Other | Admitting: *Deleted

## 2018-02-17 ENCOUNTER — Encounter: Payer: Self-pay | Admitting: Physical Therapy

## 2018-02-17 DIAGNOSIS — R278 Other lack of coordination: Secondary | ICD-10-CM | POA: Diagnosis not present

## 2018-02-17 DIAGNOSIS — R2689 Other abnormalities of gait and mobility: Secondary | ICD-10-CM | POA: Diagnosis not present

## 2018-02-17 DIAGNOSIS — R29818 Other symptoms and signs involving the nervous system: Secondary | ICD-10-CM | POA: Diagnosis not present

## 2018-02-17 DIAGNOSIS — R2681 Unsteadiness on feet: Secondary | ICD-10-CM | POA: Diagnosis not present

## 2018-02-17 DIAGNOSIS — I4891 Unspecified atrial fibrillation: Secondary | ICD-10-CM

## 2018-02-17 DIAGNOSIS — I4821 Permanent atrial fibrillation: Secondary | ICD-10-CM | POA: Diagnosis not present

## 2018-02-17 DIAGNOSIS — Z5181 Encounter for therapeutic drug level monitoring: Secondary | ICD-10-CM | POA: Diagnosis not present

## 2018-02-17 DIAGNOSIS — R293 Abnormal posture: Secondary | ICD-10-CM | POA: Diagnosis not present

## 2018-02-17 DIAGNOSIS — M6281 Muscle weakness (generalized): Secondary | ICD-10-CM | POA: Diagnosis not present

## 2018-02-17 LAB — POCT INR: INR: 3.7 — AB (ref 2.0–3.0)

## 2018-02-17 NOTE — Therapy (Signed)
[facility] [address] [place], [place], [address] Phone: [phone]   Fax:  [phone]  Physical Therapy Treatment  Patient Details  Name: Brian [name] MRN: [id number] Date of Birth: April 20, 1940 Referring Provider (PT): Rexene Alberts   Encounter Date: 02/17/2018  PT End of Session - 02/17/18 1036    Visit Number  6    Number of Visits  18    Date for PT Re-Evaluation  04/21/18    Authorization Type  Medicare and [place] [facility]    PT Start Time  [phone]    PT Stop Time  1038    PT Time Calculation (min)  44 min    Activity Tolerance  Patient tolerated treatment well    Behavior During Therapy  [facility] for tasks assessed/performed;Flat affect       Past Medical History:  Diagnosis Date  . Abnormal PFT 1. 05/18/08  2. 11/30/08   1. Showed mild airflow obstruction, mild restriction, mild diffusion defect; FEV1 2.22(64%), FVC 3.33(65%), FEVi% 67, TLC 5.19(69%), DLCO 77%, +BD  2. FEV1 2.38(73%), FVC 3.81(80%), FEV1% 63, TLC 5.61(80%), DLCO 79%, no BD  . Allergic rhinitis   . Anxiety   . BPH (benign prostatic hyperplasia)   . Cellulitis    right leg MRSA  . Erectile dysfunction   . GERD (gastroesophageal reflux disease)   . Heart murmur    mild MR by echo  . Hyperlipidemia   . Hypertension   . Osteoarthritis   . Paresthesias 06/22/2012  . Parkinsonism ([place]) 06/22/2012  . Permanent atrial fibrillation   . Restrictive lung disease     Past Surgical History:  Procedure Laterality Date  . CARDIAC CATHETERIZATION  11/2009   normal coronary arteries  . CARDIOVERSION     multiple  . CATARACT EXTRACTION    . COLONOSCOPY    . COLONOSCOPY WITH PROPOFOL N/A 09/02/2016   Procedure: COLONOSCOPY WITH PROPOFOL;  Surgeon: Garlan Fair, MD;  Location: [facility];  Service: Endoscopy;  Laterality: N/A;  . EYE SURGERY     bilateral cataract extraction  . JOINT REPLACEMENT    . TOTAL KNEE ARTHROPLASTY Left 11/30/2014   Procedure: TOTAL  KNEE ARTHROPLASTY;  Surgeon: Susa Day, MD;  Location: [facility];  Service: Orthopedics;  Laterality: Left;  . TOTAL KNEE ARTHROPLASTY Right 07/12/2015   Procedure: RIGHT TOTAL KNEE ARTHROPLASTY;  Surgeon: Susa Day, MD;  Location: [facility];  Service: Orthopedics;  Laterality: Right;  . TRICEPS TENDON REPAIR Right 05/20/2017  . TRICEPS TENDON REPAIR Right 05/20/2017   Procedure: TRICEPS TENDON REPAIR;  Surgeon: Iran Planas, MD;  Location: [facility];  Service: Orthopedics;  Laterality: Right;    There were no vitals filed for this visit.  Subjective Assessment - 02/17/18 0900    Subjective  Reports doing HEP as much as he can.    Patient is accompained by:  Family member    Pertinent History  Fall February 2019 with tricep tendon tear, s/p 3 months [facility] and then [facility] therapies, L TKR 9/216, R TKR 06/2015    Patient Stated Goals  Pt's goal for PT is to drive car again, to help with balance.                       [facility] Adult PT Treatment/Exercise - 02/17/18 0001      Transfers   Transfers  Sit to Stand;Stand to Sit    Sit to Stand  4: Min guard;4:  Min assist    Sit to Stand Details  Verbal cues for sequencing;Verbal cues for technique   cues to not come up to quickly but to push forward.   Stand to Sit  4: Min guard    Comments  cues for marching, foot clearance and weight shifting for turning to sit.      Ambulation/Gait   Ambulation/Gait  Yes    Ambulation/Gait Assistance  5: Supervision    Ambulation/Gait Assistance Details  Cues to "kick" RLE, to get started with gait and marching when festinating.    Ambulation Distance (Feet)  70 Feet   x70   Assistive device  Rolling walker    Gait Pattern  Decreased step length - right;Decreased step length - left;Right flexed knee in stance;Shuffle;Festinating;Trunk flexed;Narrow base of support;Poor foot clearance - right    Ambulation Surface  Level;Indoor      Posture/Postural Control   Posture/Postural Control   Postural limitations    Posture Comments  training for upright posture, upper trunk rotation, knee extension and weight shifting with elevated reaching activties and trunk/hip flexion to extension standing at counter      Knee/Hip Exercises: Stretches   Active Hamstring Stretch  Right;Left;2 reps;30 seconds   cues for posture   Active Hamstring Stretch Limitations  --   tactile and verbal cues (to pt ) for upright posture   Other Knee/Hip Stretches  Seated anterior/posterior pelvic tilt x 10 reps, with facilitation at low back to initiate anterior pelvic tilt          Balance Exercises - 02/17/18 1011      Balance Exercises: Standing   Sidestepping  5 reps   stepping over/back theraband cues for increase foot clearanc   Marching Limitations  with 1 UE on counter, x10 cues for bigger ROM, min guard.        PT Education - 02/17/18 1033    Education Details  Reviewed seated hamstring stretch and A/P pelvic tilts.    Person(s) Educated  Patient;Spouse    Methods  Explanation;Demonstration;Tactile cues;Verbal cues    Comprehension  Verbalized understanding;Returned demonstration;Verbal cues required;Tactile cues required       PT Short Term Goals - 01/22/18 1242      PT SHORT TERM GOAL #1   Title  Pt will perform HEP with wife's supervision for improved posture, transfers, balance, gait.  TARGET 02/26/18    Time  5    Period  Weeks    Status  New    Target Date  02/26/18      PT SHORT TERM GOAL #2   Title  Pt will perform at least 6 of 10 reps of sit<>stand with minimal UE support for improved transfer efficiency and safety.    Time  5    Period  Weeks    Status  New    Target Date  02/26/18      PT SHORT TERM GOAL #3   Title  Pt will improve TUG score to less than or equal to 70 seconds for decreased fall risk.    Time  5    Period  Weeks    Status  New    Target Date  02/26/18      PT SHORT TERM GOAL #4   Title  Pt/wife will demonstrate understanding of tips to  reduce freezing with gait and turns.    Time  5    Period  Weeks    Status  New    Target  Date  02/26/18        PT Long Term Goals - 01/22/18 1247      PT LONG TERM GOAL #1   Title  Pt/wife will verbalize understanding of fall prevention to decrease fall risk in home environment.  TARGET 03/26/2018    Time  9    Period  Weeks    Status  New    Target Date  03/26/18      PT LONG TERM GOAL #2   Title  Pt will perform at least 10 reps of sit<>stand with minimal to no UE support, modified independently for improved transfer efficiency and safety.    Time  9    Period  Weeks    Status  New    Target Date  03/26/18      PT LONG TERM GOAL #3   Title  Pt will improve TUG score to less than or equal to 50 seconds for decreased fall risk.    Time  9    Period  Weeks    Status  New    Target Date  03/26/18      PT LONG TERM GOAL #4   Title  Pt will improve gait velocity to at least 1.8 ft/sec for decreased fall risk, improved gait efficiency and safety.    Time  9    Period  Weeks    Status  New    Target Date  03/26/18      PT LONG TERM GOAL #5   Title  Berg balance test to be assessed, with goal to be written as appropriate.    Time  9    Period  Weeks    Status  New    Target Date  03/26/18            Plan - 02/17/18 1038    Clinical Impression Statement  Skilled session focused on reviewing updated HEP, mobility training with transfers and gait with instruction on posture, sequence, technique for addressing festinating gait.  Continued to work on stepping and weightshifting strategies; pt requires min A to minguard for balance, min to mod cues for technique and UE support with activities.    Rehab Potential  Good    Clinical Impairments Affecting Rehab Potential  supportive wife; cognition/severity of PD deficits    PT Frequency  2x / week    PT Duration  Other (comment)   9 weeks   PT Treatment/Interventions  ADLs/Self Care Home Management;DME Instruction;Gait  training;Stair training;Functional mobility training;Therapeutic activities;Therapeutic exercise;Balance training;Patient/family education;Neuromuscular re-education    PT Next Visit Plan  Review stretches/ exercises added to HEP; transfers focusing on terminal knee extension; standing (modified PWR! Moves) to increase step length and posture; gait training for increased step length and posture.    Consulted and Agree with Plan of Care  Patient;Family member/caregiver    Family Member Consulted  wife, [name]        Patient will benefit from skilled therapeutic intervention in order to improve the following deficits and impairments:  Abnormal gait, Decreased balance, Decreased coordination, Decreased mobility, Decreased safety awareness, Difficulty walking, Decreased strength, Impaired flexibility, Postural dysfunction  Visit Diagnosis: Other abnormalities of gait and mobility  Other symptoms and signs involving the nervous system  Unsteadiness on feet     Problem List Patient Active Problem List   Diagnosis Date Noted  . Rupture of right triceps tendon 05/20/2017  . Triceps tendon rupture, right, initial encounter 05/20/2017  . Atrial fibrillation ([place]) [I48.91]  02/05/2017  . Urinary retention 12/31/2015  . Acute renal failure (ARF) ([place]) 12/31/2015  . Acute encephalopathy 12/31/2015  . Primary osteoarthritis of right knee 07/12/2015  . Primary osteoarthritis of left knee 11/30/2014  . Encounter for therapeutic drug monitoring 06/21/2013  . Edema of extremities 02/04/2013  . Chronic anticoagulation 02/04/2013  . GERD (gastroesophageal reflux disease)   . Restrictive lung disease   . CKD (chronic kidney disease) stage 2, GFR 60-89 ml/min   . Cellulitis   . Allergic rhinitis   . Anxiety   . Long term (current) use of anticoagulants 01/25/2013  . Parkinsonism ([place]) 06/22/2012  . Paresthesias 06/22/2012  . PULMONARY NODULE 06/02/2008  . SNORING 06/02/2008  . Hyperlipidemia  05/15/2008  . Essential hypertension 05/15/2008  . PULMONARY FUNCTION TESTS, ABNORMAL 05/15/2008    Bjorn Loser, PTA  02/17/18, 10:44 AM [facility] [address] [place], [place], [address] Phone: [phone]   Fax:  [phone]  Name: Brian [name] MRN: [id number] Date of Birth: [birth year]

## 2018-02-17 NOTE — Patient Instructions (Signed)
Description   Skip today's dose, then start taking 1 tablet daily. Continue eating dark green leafy veggies three times a week. Recheck INR in 2 weeks. Call with any questions or concerns [facility] [phone]

## 2018-02-23 ENCOUNTER — Ambulatory Visit: Payer: Medicare Other | Admitting: Occupational Therapy

## 2018-02-23 ENCOUNTER — Encounter: Payer: Self-pay | Admitting: Physical Therapy

## 2018-02-23 ENCOUNTER — Ambulatory Visit: Payer: Medicare Other | Attending: Internal Medicine

## 2018-02-23 ENCOUNTER — Ambulatory Visit: Payer: Medicare Other | Admitting: Physical Therapy

## 2018-02-23 DIAGNOSIS — R2689 Other abnormalities of gait and mobility: Secondary | ICD-10-CM

## 2018-02-23 DIAGNOSIS — R278 Other lack of coordination: Secondary | ICD-10-CM | POA: Insufficient documentation

## 2018-02-23 DIAGNOSIS — R29818 Other symptoms and signs involving the nervous system: Secondary | ICD-10-CM | POA: Insufficient documentation

## 2018-02-23 DIAGNOSIS — R41844 Frontal lobe and executive function deficit: Secondary | ICD-10-CM | POA: Insufficient documentation

## 2018-02-23 DIAGNOSIS — M6281 Muscle weakness (generalized): Secondary | ICD-10-CM

## 2018-02-23 DIAGNOSIS — R293 Abnormal posture: Secondary | ICD-10-CM | POA: Diagnosis not present

## 2018-02-23 DIAGNOSIS — R471 Dysarthria and anarthria: Secondary | ICD-10-CM | POA: Diagnosis not present

## 2018-02-23 DIAGNOSIS — R1310 Dysphagia, unspecified: Secondary | ICD-10-CM | POA: Insufficient documentation

## 2018-02-23 DIAGNOSIS — R2681 Unsteadiness on feet: Secondary | ICD-10-CM

## 2018-02-23 NOTE — Therapy (Signed)
[facility] [address] [place], [place], [address] Phone: [phone]   Fax:  [phone]  Speech Language Pathology Treatment  Patient Details  Name: Brian [name] MRN: [id number] Date of Birth: 09/28/1940 Referring Provider (SLP): Star Age, MD   Encounter Date: 02/23/2018  End of Session - 02/23/18 1614    Visit Number  6    Number of Visits  17    Date for SLP Re-Evaluation  04/21/18    SLP Start Time  6073    SLP Stop Time   1445    SLP Time Calculation (min)  42 min    Activity Tolerance  Patient tolerated treatment well       Past Medical History:  Diagnosis Date  . Abnormal PFT 1. 05/18/08  2. 11/30/08   1. Showed mild airflow obstruction, mild restriction, mild diffusion defect; FEV1 2.22(64%), FVC 3.33(65%), FEVi% 67, TLC 5.19(69%), DLCO 77%, +BD  2. FEV1 2.38(73%), FVC 3.81(80%), FEV1% 63, TLC 5.61(80%), DLCO 79%, no BD  . Allergic rhinitis   . Anxiety   . BPH (benign prostatic hyperplasia)   . Cellulitis    right leg MRSA  . Erectile dysfunction   . GERD (gastroesophageal reflux disease)   . Heart murmur    mild MR by echo  . Hyperlipidemia   . Hypertension   . Osteoarthritis   . Paresthesias 06/22/2012  . Parkinsonism ([place]) 06/22/2012  . Permanent atrial fibrillation   . Restrictive lung disease     Past Surgical History:  Procedure Laterality Date  . CARDIAC CATHETERIZATION  11/2009   normal coronary arteries  . CARDIOVERSION     multiple  . CATARACT EXTRACTION    . COLONOSCOPY    . COLONOSCOPY WITH PROPOFOL N/A 09/02/2016   Procedure: COLONOSCOPY WITH PROPOFOL;  Surgeon: Garlan Fair, MD;  [facility];  Service: Endoscopy;  Laterality: N/A;  . EYE SURGERY     bilateral cataract extraction  . JOINT REPLACEMENT    . TOTAL KNEE ARTHROPLASTY Left 11/30/2014   Procedure: TOTAL KNEE ARTHROPLASTY;  Surgeon: Susa Day, MD;  [facility];  Service: Orthopedics;   Laterality: Left;  . TOTAL KNEE ARTHROPLASTY Right 07/12/2015   Procedure: RIGHT TOTAL KNEE ARTHROPLASTY;  Surgeon: Susa Day, MD;  [facility];  Service: Orthopedics;  Laterality: Right;  . TRICEPS TENDON REPAIR Right 05/20/2017  . TRICEPS TENDON REPAIR Right 05/20/2017   Procedure: TRICEPS TENDON REPAIR;  Surgeon: Iran Planas, MD;  Location: [facility];  Service: Orthopedics;  Laterality: Right;    There were no vitals filed for this visit.  Subjective Assessment - 02/23/18 1420    Subjective  "We have so many people over on Easter Sunday." (pt, re: SLP "How was your holiday?")    Currently in Pain?  No/denies            ADULT SLP TREATMENT - 02/23/18 1421      General Information   Behavior/Cognition  Alert;Cooperative;Pleasant mood      Treatment Provided   Treatment provided  Cognitive-Linquistic      Cognitive-Linquistic Treatment   Treatment focused on  Dysarthria    Skilled Treatment  In conversation with SLP pt talking sub 70dB consistently for 60 second conversation, but when aphonia resulted once, pt instantly used WNL volume voice. SLP used loud /a/ to recalibrate pt's vocal volume and pt responded with /a/ in upper 80s dB after consistent mod-max cues from SLP. Pt read sentences with upper 60s  dB. He responded with sentences with mid-upper 60s dB with performance based heavily upon cognitive load.       Assessment / Recommendations / Plan   Plan  Continue with current plan of care      Progression Toward Goals   Progression toward goals  --   decrd cognitive linguistic skills somewhat hinder progress        SLP Short Term Goals - 02/23/18 1616      SLP SHORT TERM GOAL #1   Title  Pt will generate loud /a/ with average of upper 80s dB over 3 sessions    Baseline  02-11-18, 02-12-18    Time  2    Period  Weeks    Status  On-going      SLP SHORT TERM GOAL #2   Title  Pt will use abdominal breathing at rest 85% success over 2 sessions    Time  2     Period  Weeks    Status  On-going      SLP SHORT TERM GOAL #3   Title  Pt will use speech volume average low 70sdB when responding with sentences 18/20 over two sessions    Time  2    Period  Weeks    Status  On-going      SLP SHORT TERM GOAL #4   Title  pt will tell SLP 3 anomia strategies over three sessions    Time  2    Period  Weeks    Status  On-going       SLP Long Term Goals - 02/23/18 1617      SLP LONG TERM GOAL #1   Title  pt will generate loud /a/ average low 90s dB over 6 sessions    Baseline  02-11-18    Time  6   or 17 visits, for all LTGs   Period  Weeks   or 17 visits, for all LTGs   Status  On-going      SLP LONG TERM GOAL #2   Title  Pt will use abdominal breathing in sentences or short conversation 80% success over 2 sessions    Time  6    Period  Weeks    Status  On-going      SLP LONG TERM GOAL #3   Title  pt will use anomia compensations in 75% of opportunities in sentence responses, with min verbal cues to use them, over 3 sessions    Time  6    Period  Weeks    Status  On-going      SLP LONG TERM GOAL #4   Title  pt will undergo objective swallow eval PRN    Time  2    Period  Weeks    Status  On-going       Plan - 02/23/18 1615    Clinical Impression Statement  Pt cont to present with dysarthria including reduced speech volume (especially with longer utterances and when cognitive load increases). He showed cont'd ability to intermittently self-correct today in min complex conversation. No overt s/s aspiration PNA today. Pt may require a modified barium swallow or FEES at some point during this therapy course. He would benefit from skilled ST addressing these skills to communicate more efficiently.     Speech Therapy Frequency  2x / week    Duration  --   8 weeks or 17 visits   Potential to Achieve Goals  Good  Patient will benefit from skilled therapeutic intervention in order to improve the following deficits and impairments:    Dysarthria and anarthria  Dysphagia, unspecified type    Problem List Patient Active Problem List   Diagnosis Date Noted  . Rupture of right triceps tendon 05/20/2017  . Triceps tendon rupture, right, initial encounter 05/20/2017  . Atrial fibrillation ([place]) [I48.91] 02/05/2017  . Urinary retention 12/31/2015  . Acute renal failure (ARF) ([place]) 12/31/2015  . Acute encephalopathy 12/31/2015  . Primary osteoarthritis of right knee 07/12/2015  . Primary osteoarthritis of left knee 11/30/2014  . Encounter for therapeutic drug monitoring 06/21/2013  . Edema of extremities 02/04/2013  . Chronic anticoagulation 02/04/2013  . GERD (gastroesophageal reflux disease)   . Restrictive lung disease   . CKD (chronic kidney disease) stage 2, GFR 60-89 ml/min   . Cellulitis   . Allergic rhinitis   . Anxiety   . Long term (current) use of anticoagulants 01/25/2013  . Parkinsonism ([place]) 06/22/2012  . Paresthesias 06/22/2012  . PULMONARY NODULE 06/02/2008  . SNORING 06/02/2008  . Hyperlipidemia 05/15/2008  . Essential hypertension 05/15/2008  . PULMONARY FUNCTION TESTS, ABNORMAL 05/15/2008    [facility] ,MS, CCC-SLP  02/23/2018, 4:18 PM  [facility] [address] [place], [place], [address] Phone: [phone]   Fax:  [phone]   Name: RAMON ZANDERS MRN: [id number] Date of Birth: Sep 18, 1940

## 2018-02-23 NOTE — Therapy (Signed)
[facility] [address] [place], [place], [address] Phone: [phone]   Fax:  [phone]  Physical Therapy Treatment  Patient Details  Name: Brian [name] MRN: [id number] Date of Birth: [birth year] Referring Provider (PT): Rexene Alberts   Encounter Date: 02/23/2018  PT End of Session - 02/23/18 1537    Visit Number  7    Number of Visits  18    Date for PT Re-Evaluation  04/21/18    Authorization Type  Medicare and [place] Time  1941    PT Stop Time  1620    PT Time Calculation (min)  46 min    Equipment Utilized During Treatment  Gait belt    Activity Tolerance  Patient limited by fatigue    Behavior During Therapy  [facility] for tasks assessed/performed;Flat affect       Past Medical History:  Diagnosis Date  . Abnormal PFT 1. 05/18/08  2. 11/30/08   1. Showed mild airflow obstruction, mild restriction, mild diffusion defect; FEV1 2.22(64%), FVC 3.33(65%), FEVi% 67, TLC 5.19(69%), DLCO 77%, +BD  2. FEV1 2.38(73%), FVC 3.81(80%), FEV1% 63, TLC 5.61(80%), DLCO 79%, no BD  . Allergic rhinitis   . Anxiety   . BPH (benign prostatic hyperplasia)   . Cellulitis    right leg MRSA  . Erectile dysfunction   . GERD (gastroesophageal reflux disease)   . Heart murmur    mild MR by echo  . Hyperlipidemia   . Hypertension   . Osteoarthritis   . Paresthesias 06/22/2012  . Parkinsonism ([place]) 06/22/2012  . Permanent atrial fibrillation   . Restrictive lung disease     Past Surgical History:  Procedure Laterality Date  . CARDIAC CATHETERIZATION  11/2009   normal coronary arteries  . CARDIOVERSION     multiple  . CATARACT EXTRACTION    . COLONOSCOPY    . COLONOSCOPY WITH PROPOFOL N/A 09/02/2016   Procedure: COLONOSCOPY WITH PROPOFOL;  Surgeon: Garlan Fair, MD;  [facility];  Service: Endoscopy;  Laterality: N/A;  . EYE SURGERY     bilateral cataract extraction  . JOINT REPLACEMENT    . TOTAL KNEE  ARTHROPLASTY Left 11/30/2014   Procedure: TOTAL KNEE ARTHROPLASTY;  Surgeon: Susa Day, MD;  Location: [facility];  Service: Orthopedics;  Laterality: Left;  . TOTAL KNEE ARTHROPLASTY Right 07/12/2015   Procedure: RIGHT TOTAL KNEE ARTHROPLASTY;  Surgeon: Susa Day, MD;  [facility];  Service: Orthopedics;  Laterality: Right;  . TRICEPS TENDON REPAIR Right 05/20/2017  . TRICEPS TENDON REPAIR Right 05/20/2017   Procedure: TRICEPS TENDON REPAIR;  Surgeon: Iran Planas, MD;  Location: [facility];  Service: Orthopedics;  Laterality: Right;    There were no vitals filed for this visit.  Subjective Assessment - 02/23/18 1538    Subjective  reports doing HEP not as often as he should. Does them on his own. Wife reports pt seems overwhelmed with all the ex's in HEP.     Patient is accompained by:  Family member    Pertinent History  Fall February 2019 with tricep tendon tear, s/p 3 months [facility] and then [facility] therapies, L TKR 9/216, R TKR 06/2015    Patient Stated Goals  Pt's goal for PT is to drive car again, to help with balance.    Currently in Pain?  No/denies  [facility] Adult PT Treatment/Exercise - 02/23/18 2129      Transfers   Transfers  Sit to Stand;Stand to Sit    Sit to Stand  4: Min guard;4: Min assist    Sit to Stand Details (indicate cue type and reason)  vc, visual cues to increase hip flexion/ant wt-shift over BOS (tends to fall posteriorly prior to achieving standing)    Stand to Sit  4: Min guard    Number of Reps  1 set   5 reps; then 1 rep x 3 during session     Ambulation/Gait   Ambulation/Gait Assistance  4: Min guard;4: Min assist    Ambulation/Gait Assistance Details  max cues (progressing to min cues) to stop when freezing begins; occasional assist to maintain balance when freezing as he does not release handles of U-step walker and it gets too far ahead with pt losing balance forward with feet frozen; max verbal cues for  "marching" to initiate stepping/foot clearance, for incr step length    Ambulation Distance (Feet)  40 Feet   75, 60, 40   Assistive device  Rolling walker;Other (Comment)   clinic's U-step   Gait Pattern  Decreased step length - right;Decreased step length - left;Right flexed knee in stance;Shuffle;Festinating;Trunk flexed;Narrow base of support;Poor foot clearance - right;Decreased hip/knee flexion - right;Decreased hip/knee flexion - left;Right foot flat;Left foot flat    Gait Comments  per wife, pt freezing more today than typical (also states he has more difficulty in late afternoon--PT at 3:30 today and 3rd therapy session after SLP and OT); pt required seated rest breaks x 3 and ultimately use of wheelchair for transport gym to car        [facility]) - 02/23/18 2138    PWR! exercises  Moves in [place]! Up  10    PWR! Rock  10    PWR! Twist  10   5 each side   Comments  modified quadruped at counter with minguard assist            PT Short Term Goals - 01/22/18 1242      PT SHORT TERM GOAL #1   Title  Pt will perform HEP with wife's supervision for improved posture, transfers, balance, gait.  TARGET 02/26/18    Time  5    Period  Weeks    Status  New    Target Date  02/26/18      PT SHORT TERM GOAL #2   Title  Pt will perform at least 6 of 10 reps of sit<>stand with minimal UE support for improved transfer efficiency and safety.    Time  5    Period  Weeks    Status  New    Target Date  02/26/18      PT SHORT TERM GOAL #3   Title  Pt will improve TUG score to less than or equal to 70 seconds for decreased fall risk.    Time  5    Period  Weeks    Status  New    Target Date  02/26/18      PT SHORT TERM GOAL #4   Title  Pt/wife will demonstrate understanding of tips to reduce freezing with gait and turns.    Time  5    Period  Weeks    Status  New    Target Date  02/26/18        PT Long Term Goals - 01/22/18 1247  PT LONG TERM GOAL #1    Title  Pt/wife will verbalize understanding of fall prevention to decrease fall risk in home environment.  TARGET 03/26/2018    Time  9    Period  Weeks    Status  New    Target Date  03/26/18      PT LONG TERM GOAL #2   Title  Pt will perform at least 10 reps of sit<>stand with minimal to no UE support, modified independently for improved transfer efficiency and safety.    Time  9    Period  Weeks    Status  New    Target Date  03/26/18      PT LONG TERM GOAL #3   Title  Pt will improve TUG score to less than or equal to 50 seconds for decreased fall risk.    Time  9    Period  Weeks    Status  New    Target Date  03/26/18      PT LONG TERM GOAL #4   Title  Pt will improve gait velocity to at least 1.8 ft/sec for decreased fall risk, improved gait efficiency and safety.    Time  9    Period  Weeks    Status  New    Target Date  03/26/18      PT LONG TERM GOAL #5   Title  Berg balance test to be assessed, with goal to be written as appropriate.    Time  9    Period  Weeks    Status  New    Target Date  03/26/18            Plan - 02/23/18 2140    Clinical Impression Statement  Skilled session focused on transfer training, gait training, and PWR exercises for stretching, posture and wt-shifting. Much time spent on working through freezing episodes during gait with wife ultimately reporting he was freezing more than usual this afternoon. Required multiple seated rest periods during PT session (was his last of 3 consecutive therapy sessions). ULtimately pt required transport to his car via wheelchair due to fatigue and difficulty walking. Plan to check STGs next visit. Discussed with OT and will suggest using wheelchair to transport to/from lobby and between SLP and gym to decrease time spent making these transitions.     Rehab Potential  Good    Clinical Impairments Affecting Rehab Potential  supportive wife; cognition/severity of PD deficits    PT Frequency  2x / week    PT  Duration  Other (comment)   9 weeks   PT Treatment/Interventions  ADLs/Self Care Home Management;DME Instruction;Gait training;Stair training;Functional mobility training;Therapeutic activities;Therapeutic exercise;Balance training;Patient/family education;Neuromuscular re-education    PT Next Visit Plan  ?use w/c from lobby or SLP office to gym; check STGs; review expectations for PT HEP (appears he has 3 exercises from PT, yet wife states he's overwhelmed--only a few ex's from SLP and OT--not sure if he has handouts from prior therapies and just keeps adding to those?) transfers focusing on fwd transition and then terminal knee extension; standing (modified PWR! Moves) to increase step length and posture; gait training for increased step length and posture.    Consulted and Agree with Plan of Care  Patient;Family member/caregiver    Family Member Consulted  wife, [name]        Patient will benefit from skilled therapeutic intervention in order to improve the following deficits and impairments:  Abnormal gait, Decreased balance,  Decreased coordination, Decreased mobility, Decreased safety awareness, Difficulty walking, Decreased strength, Impaired flexibility, Postural dysfunction  Visit Diagnosis: Other symptoms and signs involving the nervous system  Other abnormalities of gait and mobility  Unsteadiness on feet     Problem List Patient Active Problem List   Diagnosis Date Noted  . Rupture of right triceps tendon 05/20/2017  . Triceps tendon rupture, right, initial encounter 05/20/2017  . Atrial fibrillation ([place]) [I48.91] 02/05/2017  . Urinary retention 12/31/2015  . Acute renal failure (ARF) ([place]) 12/31/2015  . Acute encephalopathy 12/31/2015  . Primary osteoarthritis of right knee 07/12/2015  . Primary osteoarthritis of left knee 11/30/2014  . Encounter for therapeutic drug monitoring 06/21/2013  . Edema of extremities 02/04/2013  . Chronic anticoagulation 02/04/2013  . GERD  (gastroesophageal reflux disease)   . Restrictive lung disease   . CKD (chronic kidney disease) stage 2, GFR 60-89 ml/min   . Cellulitis   . Allergic rhinitis   . Anxiety   . Long term (current) use of anticoagulants 01/25/2013  . Parkinsonism ([place]) 06/22/2012  . Paresthesias 06/22/2012  . PULMONARY NODULE 06/02/2008  . SNORING 06/02/2008  . Hyperlipidemia 05/15/2008  . Essential hypertension 05/15/2008  . PULMONARY FUNCTION TESTS, ABNORMAL 05/15/2008    Rexanne Mano, PT 02/23/2018, 9:51 PM  [facility] [address] [place], [place], [address] Phone: [phone]   Fax:  [phone]  Name: Brian [name] MRN: [id number] Date of Birth: Dec 28, 1940

## 2018-02-23 NOTE — Patient Instructions (Signed)
   Please write down (or have your wife write down) 8-10 sentences you would say every day and bring them to therapy next time. Examples: I love you.  Did you get the mail?  Who was that on the phone?

## 2018-02-24 NOTE — Therapy (Signed)
[facility] [address] [place], [place], [address] Phone: [phone]   Fax:  [phone]  Occupational Therapy Treatment  Patient Details  Name: Brian [name] MRN: [id number] Date of Birth: [birth year] Referring Provider (OT): Dr. Rexene Alberts   Encounter Date: 02/23/2018  OT End of Session - 02/23/18 1459    Visit Number  6    Number of Visits  25    Date for OT Re-Evaluation  04/22/18    Authorization Type  Medicare    Authorization Time Period  cert 12 weeks, anticipate d/c following 8 weeks    Authorization - Visit Number  6    Authorization - Number of Visits  10    OT Start Time  1451    OT Stop Time  1530    OT Time Calculation (min)  39 min    Activity Tolerance  Patient tolerated treatment well    Behavior During Therapy  [facility] for tasks assessed/performed;Flat affect       Past Medical History:  Diagnosis Date  . Abnormal PFT 1. 05/18/08  2. 11/30/08   1. Showed mild airflow obstruction, mild restriction, mild diffusion defect; FEV1 2.22(64%), FVC 3.33(65%), FEVi% 67, TLC 5.19(69%), DLCO 77%, +BD  2. FEV1 2.38(73%), FVC 3.81(80%), FEV1% 63, TLC 5.61(80%), DLCO 79%, no BD  . Allergic rhinitis   . Anxiety   . BPH (benign prostatic hyperplasia)   . Cellulitis    right leg MRSA  . Erectile dysfunction   . GERD (gastroesophageal reflux disease)   . Heart murmur    mild MR by echo  . Hyperlipidemia   . Hypertension   . Osteoarthritis   . Paresthesias 06/22/2012  . Parkinsonism ([place]) 06/22/2012  . Permanent atrial fibrillation   . Restrictive lung disease     Past Surgical History:  Procedure Laterality Date  . CARDIAC CATHETERIZATION  11/2009   normal coronary arteries  . CARDIOVERSION     multiple  . CATARACT EXTRACTION    . COLONOSCOPY    . COLONOSCOPY WITH PROPOFOL N/A 09/02/2016   Procedure: COLONOSCOPY WITH PROPOFOL;  Surgeon: Garlan Fair, MD;  [facility];  Service: Endoscopy;   Laterality: N/A;  . EYE SURGERY     bilateral cataract extraction  . JOINT REPLACEMENT    . TOTAL KNEE ARTHROPLASTY Left 11/30/2014   Procedure: TOTAL KNEE ARTHROPLASTY;  Surgeon: Susa Day, MD;  [facility];  Service: Orthopedics;  Laterality: Left;  . TOTAL KNEE ARTHROPLASTY Right 07/12/2015   Procedure: RIGHT TOTAL KNEE ARTHROPLASTY;  Surgeon: Susa Day, MD;  [facility];  Service: Orthopedics;  Laterality: Right;  . TRICEPS TENDON REPAIR Right 05/20/2017  . TRICEPS TENDON REPAIR Right 05/20/2017   Procedure: TRICEPS TENDON REPAIR;  Surgeon: Iran Planas, MD;  Location: [facility];  Service: Orthopedics;  Laterality: Right;    There were no vitals filed for this visit.  Subjective Assessment - 02/24/18 1638    Subjective   Pt reports difficulty donning shoes and socks    Patient Stated Goals  to be as independent as he can, to be able dress himself    Currently in Pain?  No/denies               Treatment: Pt performed stretches to reach for the floor x 5 reps followed by doffing and donning his shoes/ socks. Mod v.c and increased time required, pt used reacher to remove socks, then sockaide to donn socks, long handled shoe horn  to put shoes on, and footstool to prop foot. Increased time required, pt was encouraged to perfrorm at home.               OT Short Term Goals - 02/23/18 1503      OT SHORT TERM GOAL #1   Title  Pt/ caregeiver willl be I with PD specific HEP    Status  On-going      OT SHORT TERM GOAL #2   Title  Pt /wife will verbalize understanding of adapted strategies for ADLS/ IADLs to maximize safety and independence.    Status  On-going      OT SHORT TERM GOAL #3   Title  Pt will perfom LB dressing with min A    Status  On-going      OT SHORT TERM GOAL #4   Title  Pt will demonstrate improved fine motor coordination as evidenced by decreasing 9 hole pegs test by 4 secs bilaterally.    Status  On-going      OT SHORT TERM GOAL  #5   Title  Pt/ wife will verbalize understanding of compensatory strategies for memory/ cognition    Status  On-going        OT Long Term Goals - 01/22/18 1246      OT LONG TERM GOAL #1   Title  Pt and wife will verbalize understanding of ways to prevent future complications and appropriate community resources    Time  12    Period  Weeks    Status  New    Target Date  04/22/18      OT LONG TERM GOAL #2   Title  Pt will demonstrate aiblity to retrieve a lightweight object at 115 shoulder flexion and -35 elbow extension with RUE.    Time  12    Period  Weeks    Status  New      OT LONG TERM GOAL #3   Title  Pt.will demonstrate ability to retrieve a lightweight obect at 120 shoulder flexion with LUE.    Time  12    Period  Weeks    Status  New      OT LONG TERM GOAL #4   Title  Pt will demonstrate improved gross motor coordination/ bilateral UE functional use for ADLs as evidenced by increasing box/ blocks score bilaterally by 3 blocks    Baseline  RUE 26 blocks, LUe 24 blocks     Time  12    Period  Weeks    Status  New      OT LONG TERM GOAL #5   Title  Pt will demonstrae ability to fasten 3 buttons in 75 secs or less    Time  12    Period  Weeks    Status  New            Plan - 02/24/18 1631    Clinical Impression Statement  Pt is progressing towards goals. Pt was instructed in adapted strategies for donning/ dofficng shoes, pt's wife observed and she is able to cue patient.    Occupational Profile and client history currently impacting functional performance  Pt was independent with ADLS prior to his triceps tear in February, now he requires assistance with ADLS and has had several falls.KYH:CWCBJSEGB'T disease(diagnosed with parkinsonism 2012)triceps tear Feb 2019,R TKR 2016, L TKR 2017, anxiety, arthritis    Occupational performance deficits (Please refer to evaluation for details):  IADL's;ADL's;Leisure;Social Participation    Rehab Potential  Fair     Current Impairments/barriers affecting progress:  length of time since onset, severity of deficits, cognitive deficits    OT Frequency  2x / week    OT Duration  12 weeks    OT Treatment/Interventions  Self-care/ADL training;Therapeutic exercise;Gait Training;Aquatic Therapy;Moist Heat;Paraffin;Neuromuscular education;Balance training;Patient/family education;Therapeutic activities;Functional Mobility Training;Fluidtherapy;Cryotherapy;Ultrasound;Contrast Bath;DME and/or AE instruction;Manual Therapy;Passive range of motion;Cognitive remediation/compensation    Plan  continue to reinforce ADL strategies    Consulted and Agree with Plan of Care  Patient;Family member/caregiver    Family Member Consulted  wife       Patient will benefit from skilled therapeutic intervention in order to improve the following deficits and impairments:  Abnormal gait, Decreased cognition, Impaired flexibility, Pain, Decreased coordination, Decreased mobility, Decreased strength, Decreased range of motion, Decreased activity tolerance, Decreased balance, Decreased endurance, Decreased knowledge of precautions, Decreased safety awareness, Difficulty walking, Impaired perceived functional ability, Impaired UE functional use  Visit Diagnosis: Other symptoms and signs involving the nervous system  Muscle weakness (generalized)  Other lack of coordination  Frontal lobe and executive function deficit    Problem List Patient Active Problem List   Diagnosis Date Noted  . Rupture of right triceps tendon 05/20/2017  . Triceps tendon rupture, right, initial encounter 05/20/2017  . Atrial fibrillation ([place]) [I48.91] 02/05/2017  . Urinary retention 12/31/2015  . Acute renal failure (ARF) ([place]) 12/31/2015  . Acute encephalopathy 12/31/2015  . Primary osteoarthritis of right knee 07/12/2015  . Primary osteoarthritis of left knee 11/30/2014  . Encounter for therapeutic drug monitoring 06/21/2013  . Edema of extremities  02/04/2013  . Chronic anticoagulation 02/04/2013  . GERD (gastroesophageal reflux disease)   . Restrictive lung disease   . CKD (chronic kidney disease) stage 2, GFR 60-89 ml/min   . Cellulitis   . Allergic rhinitis   . Anxiety   . Long term (current) use of anticoagulants 01/25/2013  . Parkinsonism ([place]) 06/22/2012  . Paresthesias 06/22/2012  . PULMONARY NODULE 06/02/2008  . SNORING 06/02/2008  . Hyperlipidemia 05/15/2008  . Essential hypertension 05/15/2008  . PULMONARY FUNCTION TESTS, ABNORMAL 05/15/2008    [place] [place] 02/24/2018, 4:39 PM Theone Murdoch, OTR/L [phone] Phone: [phone] 4:45 PM 02/24/18 [facility] [address] [place], [place], [address] Phone: [phone]   Fax:  [phone]  Name: Brian [name] MRN: [id number] Date of Birth: 05/12/1940

## 2018-02-25 ENCOUNTER — Ambulatory Visit: Payer: Medicare Other | Admitting: Occupational Therapy

## 2018-02-25 ENCOUNTER — Ambulatory Visit: Payer: Medicare Other | Admitting: Physical Therapy

## 2018-02-25 ENCOUNTER — Ambulatory Visit: Payer: Medicare Other

## 2018-02-25 DIAGNOSIS — R471 Dysarthria and anarthria: Secondary | ICD-10-CM

## 2018-02-25 DIAGNOSIS — R2689 Other abnormalities of gait and mobility: Secondary | ICD-10-CM | POA: Diagnosis not present

## 2018-02-25 DIAGNOSIS — M6281 Muscle weakness (generalized): Secondary | ICD-10-CM

## 2018-02-25 DIAGNOSIS — R29818 Other symptoms and signs involving the nervous system: Secondary | ICD-10-CM

## 2018-02-25 DIAGNOSIS — R293 Abnormal posture: Secondary | ICD-10-CM | POA: Diagnosis not present

## 2018-02-25 DIAGNOSIS — R1310 Dysphagia, unspecified: Secondary | ICD-10-CM | POA: Diagnosis not present

## 2018-02-25 DIAGNOSIS — R41844 Frontal lobe and executive function deficit: Secondary | ICD-10-CM

## 2018-02-25 DIAGNOSIS — R278 Other lack of coordination: Secondary | ICD-10-CM

## 2018-02-25 NOTE — Patient Instructions (Signed)
Coordination Exercises  Perform the following exercises for 10 minutes 2 times per day. Perform with both hand(s). Perform using big movements.   Flipping Cards: Place deck of cards on the table. Flip cards over by opening your hand big to grasp and then turn your palm up big.  Deal cards: Hold 1/2 or whole deck in your hand. Use thumb to push card off top of deck with one big push.  Pick up coins and place in coin bank or container: Pick up with big, intentional movements. Do not drag coin to the edge.  Pick up coins and stack one at a time: Pick up with big, intentional movements. Do not drag coin to the edge. (5-10 in a stack)  Pick up 5-10 coins one at a time and hold in palm. Then, move coins from palm to fingertips one at time and place in coin bank/container.  Perform "Flicks"/hand stretches (PWR! Hands): Close hands then flick out your fingers with focus on opening hands, pulling wrists back, and extending elbows like you are pushing.

## 2018-02-25 NOTE — Therapy (Signed)
[facility] [address] [place], [place], [address] Phone: [phone]   Fax:  [phone]  Speech Language Pathology Treatment  Patient Details  Name: Brian [name] MRN: [id number] Date of Birth: Jan 18, 1941 Referring Provider (SLP): Star Age, MD   Encounter Date: 02/25/2018  End of Session - 02/25/18 1530    Visit Number  7    Number of Visits  17    Date for SLP Re-Evaluation  04/21/18    SLP Start Time  1455   taking meds at 1450   SLP Stop Time   1530    SLP Time Calculation (min)  35 min    Activity Tolerance  Patient tolerated treatment well       Past Medical History:  Diagnosis Date  . Abnormal PFT 1. 05/18/08  2. 11/30/08   1. Showed mild airflow obstruction, mild restriction, mild diffusion defect; FEV1 2.22(64%), FVC 3.33(65%), FEVi% 67, TLC 5.19(69%), DLCO 77%, +BD  2. FEV1 2.38(73%), FVC 3.81(80%), FEV1% 63, TLC 5.61(80%), DLCO 79%, no BD  . Allergic rhinitis   . Anxiety   . BPH (benign prostatic hyperplasia)   . Cellulitis    right leg MRSA  . Erectile dysfunction   . GERD (gastroesophageal reflux disease)   . Heart murmur    mild MR by echo  . Hyperlipidemia   . Hypertension   . Osteoarthritis   . Paresthesias 06/22/2012  . Parkinsonism ([place]) 06/22/2012  . Permanent atrial fibrillation   . Restrictive lung disease     Past Surgical History:  Procedure Laterality Date  . CARDIAC CATHETERIZATION  11/2009   normal coronary arteries  . CARDIOVERSION     multiple  . CATARACT EXTRACTION    . COLONOSCOPY    . COLONOSCOPY WITH PROPOFOL N/A 09/02/2016   Procedure: COLONOSCOPY WITH PROPOFOL;  Surgeon: Garlan Fair, MD;  Location: [facility];  Service: Endoscopy;  Laterality: N/A;  . EYE SURGERY     bilateral cataract extraction  . JOINT REPLACEMENT    . TOTAL KNEE ARTHROPLASTY Left 11/30/2014   Procedure: TOTAL KNEE ARTHROPLASTY;  Surgeon: Susa Day, MD;  Location: [facility];  Service:  Orthopedics;  Laterality: Left;  . TOTAL KNEE ARTHROPLASTY Right 07/12/2015   Procedure: RIGHT TOTAL KNEE ARTHROPLASTY;  Surgeon: Susa Day, MD;  Location: [facility];  Service: Orthopedics;  Laterality: Right;  . TRICEPS TENDON REPAIR Right 05/20/2017  . TRICEPS TENDON REPAIR Right 05/20/2017   Procedure: TRICEPS TENDON REPAIR;  Surgeon: Iran Planas, MD;  Location: [facility];  Service: Orthopedics;  Laterality: Right;    There were no vitals filed for this visit.  Subjective Assessment - 02/25/18 1459    Subjective  "We worked on --- standing to sit and - standing up."    Currently in Pain?  No/denies            ADULT SLP TREATMENT - 02/25/18 1500      General Information   Behavior/Cognition  Alert;Cooperative;Pleasant mood   flat affect     Treatment Provided   Treatment provided  Cognitive-Linquistic      Cognitive-Linquistic Treatment   Treatment focused on  Dysarthria    Skilled Treatment  SLP used loud /a/ to recalibrate conversational loudness  -average upper 80s dB with usual cues to open mouth and for loudness. In reading tasks SLP needed to provide visual aid for pt to use to decr visual distractions. Pt req'd min-mod cues usually to read all words and cues for  awareness of errors while reading. In sentence level conversational responses pt req'd mod A occasionally for maintaining WNL volume.       Assessment / Recommendations / Plan   Plan  Continue with current plan of care      Progression Toward Goals   Progression toward goals  --   decr'd cognition cont to make progress challenging        SLP Short Term Goals - 02/25/18 1729      SLP SHORT TERM GOAL #1   Title  Pt will generate loud /a/ with average of upper 80s dB over 3 sessions    Status  Achieved      SLP SHORT TERM GOAL #2   Title  Pt will use abdominal breathing at rest 85% success over 2 sessions    Time  2    Period  Weeks    Status  On-going      SLP SHORT TERM GOAL #3   Title  Pt will  use speech volume average low 70sdB when responding with sentences 18/20 over two sessions    Time  2    Period  Weeks    Status  On-going      SLP SHORT TERM GOAL #4   Title  pt will tell SLP 3 anomia strategies over three sessions    Time  2    Period  Weeks    Status  On-going       SLP Long Term Goals - 02/25/18 1730      SLP LONG TERM GOAL #1   Title  pt will generate loud /a/ average low 90s dB over 6 sessions    Baseline  02-11-18    Time  6   or 17 visits, for all LTGs   Period  Weeks   or 17 visits, for all LTGs   Status  On-going      SLP LONG TERM GOAL #2   Title  Pt will use abdominal breathing in sentences or short conversation 80% success over 2 sessions    Time  6    Period  Weeks    Status  On-going      SLP LONG TERM GOAL #3   Title  pt will use anomia compensations in 75% of opportunities in sentence responses, with min verbal cues to use them, over 3 sessions    Time  6    Period  Weeks    Status  On-going      SLP LONG TERM GOAL #4   Title  pt will undergo objective swallow eval PRN    Time  2    Period  Weeks    Status  On-going       Plan - 02/25/18 1728    Clinical Impression Statement  Pt cont to present with dysarthria including reduced speech volume (especially with longer utterances and when cognitive load increases). Ability to self-correct today was not as successful in reading tasks or in min complex conversation. Pt appears with slower processing today than in previous session this week. No overt s/s aspiration PNA today. Pt may require a modified barium swallow or FEES at some point during this therapy course. He would benefit from skilled ST addressing these skills to communicate more efficiently.     Speech Therapy Frequency  2x / week    Duration  --   8 weeks or 17 visits   Potential to Achieve Goals  Good  Patient will benefit from skilled therapeutic intervention in order to improve the following deficits and impairments:    Dysarthria and anarthria  Dysphagia, unspecified type    Problem List Patient Active Problem List   Diagnosis Date Noted  . Rupture of right triceps tendon 05/20/2017  . Triceps tendon rupture, right, initial encounter 05/20/2017  . Atrial fibrillation ([place]) [I48.91] 02/05/2017  . Urinary retention 12/31/2015  . Acute renal failure (ARF) ([place]) 12/31/2015  . Acute encephalopathy 12/31/2015  . Primary osteoarthritis of right knee 07/12/2015  . Primary osteoarthritis of left knee 11/30/2014  . Encounter for therapeutic drug monitoring 06/21/2013  . Edema of extremities 02/04/2013  . Chronic anticoagulation 02/04/2013  . GERD (gastroesophageal reflux disease)   . Restrictive lung disease   . CKD (chronic kidney disease) stage 2, GFR 60-89 ml/min   . Cellulitis   . Allergic rhinitis   . Anxiety   . Long term (current) use of anticoagulants 01/25/2013  . Parkinsonism ([place]) 06/22/2012  . Paresthesias 06/22/2012  . PULMONARY NODULE 06/02/2008  . SNORING 06/02/2008  . Hyperlipidemia 05/15/2008  . Essential hypertension 05/15/2008  . PULMONARY FUNCTION TESTS, ABNORMAL 05/15/2008    [facility] ,MS, CCC-SLP  02/25/2018, 5:30 PM  [facility] [address] [place], [place], [address] Phone: [phone]   Fax:  [phone]   Name: Brian [name] MRN: [id number] Date of Birth: 08/01/1940

## 2018-02-25 NOTE — Therapy (Signed)
[facility] [address] [place], [place], [address] Phone: [phone]   Fax:  [phone]  Occupational Therapy Treatment  Patient Details  Name: Brian [name] MRN: [id number] Date of Birth: November 09, 1940 Referring Provider (OT): Dr. Rexene Alberts   Encounter Date: 02/25/2018    Past Medical History:  Diagnosis Date  . Abnormal PFT 1. 05/18/08  2. 11/30/08   1. Showed mild airflow obstruction, mild restriction, mild diffusion defect; FEV1 2.22(64%), FVC 3.33(65%), FEVi% 67, TLC 5.19(69%), DLCO 77%, +BD  2. FEV1 2.38(73%), FVC 3.81(80%), FEV1% 63, TLC 5.61(80%), DLCO 79%, no BD  . Allergic rhinitis   . Anxiety   . BPH (benign prostatic hyperplasia)   . Cellulitis    right leg MRSA  . Erectile dysfunction   . GERD (gastroesophageal reflux disease)   . Heart murmur    mild MR by echo  . Hyperlipidemia   . Hypertension   . Osteoarthritis   . Paresthesias 06/22/2012  . Parkinsonism ([place]) 06/22/2012  . Permanent atrial fibrillation   . Restrictive lung disease     Past Surgical History:  Procedure Laterality Date  . CARDIAC CATHETERIZATION  11/2009   normal coronary arteries  . CARDIOVERSION     multiple  . CATARACT EXTRACTION    . COLONOSCOPY    . COLONOSCOPY WITH PROPOFOL N/A 09/02/2016   Procedure: COLONOSCOPY WITH PROPOFOL;  Surgeon: Garlan Fair, MD;  [facility];  Service: Endoscopy;  Laterality: N/A;  . EYE SURGERY     bilateral cataract extraction  . JOINT REPLACEMENT    . TOTAL KNEE ARTHROPLASTY Left 11/30/2014   Procedure: TOTAL KNEE ARTHROPLASTY;  Surgeon: Susa Day, MD;  [facility]: [facility];  Service: Orthopedics;  Laterality: Left;  . TOTAL KNEE ARTHROPLASTY Right 07/12/2015   Procedure: RIGHT TOTAL KNEE ARTHROPLASTY;  Surgeon: Susa Day, MD;  [facility];  Service: Orthopedics;  Laterality: Right;  . TRICEPS TENDON REPAIR Right 05/20/2017  . TRICEPS TENDON REPAIR Right 05/20/2017    Procedure: TRICEPS TENDON REPAIR;  Surgeon: Iran Planas, MD;  Location: [facility];  Service: Orthopedics;  Laterality: Right;    There were no vitals filed for this visit.                        OT treatment/ education   Education Details  seated ball exercises 10 reps each from previously issued HEP, min-mod v.c , coordination HEP- see pt instructions, pt required mod v.c    Person(s) Educated  Patient;Spouse    Methods  Explanation;Demonstration;Verbal cues;Handout    Comprehension  Verbalized understanding;Returned demonstration;Verbal cues required     Therapist assisted pt/ wife with car transfer today, pt required mod-max assistance/v.c  Pt has had a decline in mobility this week, pt's wife was instructed to call MD if this continues.      OT Short Term Goals - 02/23/18 1503      OT SHORT TERM GOAL #1   Title  Pt/ caregeiver willl be I with PD specific HEP    Status  On-going      OT SHORT TERM GOAL #2   Title  Pt /wife will verbalize understanding of adapted strategies for ADLS/ IADLs to maximize safety and independence.    Status  On-going      OT SHORT TERM GOAL #3   Title  Pt will perfom LB dressing with min A    Status  On-going      OT SHORT  TERM GOAL #4   Title  Pt will demonstrate improved fine motor coordination as evidenced by decreasing 9 hole pegs test by 4 secs bilaterally.    Status  On-going      OT SHORT TERM GOAL #5   Title  Pt/ wife will verbalize understanding of compensatory strategies for memory/ cognition    Status  On-going        OT Long Term Goals - 01/22/18 1246      OT LONG TERM GOAL #1   Title  Pt and wife will verbalize understanding of ways to prevent future complications and appropriate community resources    Time  12    Period  Weeks    Status  New    Target Date  04/22/18      OT LONG TERM GOAL #2   Title  Pt will demonstrate aiblity to retrieve a lightweight object at 115 shoulder flexion and -35 elbow  extension with RUE.    Time  12    Period  Weeks    Status  New      OT LONG TERM GOAL #3   Title  Pt.will demonstrate ability to retrieve a lightweight obect at 120 shoulder flexion with LUE.    Time  12    Period  Weeks    Status  New      OT LONG TERM GOAL #4   Title  Pt will demonstrate improved gross motor coordination/ bilateral UE functional use for ADLs as evidenced by increasing box/ blocks score bilaterally by 3 blocks    Baseline  RUE 26 blocks, LUe 24 blocks     Time  12    Period  Weeks    Status  New      OT LONG TERM GOAL #5   Title  Pt will demonstrae ability to fasten 3 buttons in 75 secs or less    Time  12    Period  Weeks    Status  New            Plan - 02/25/18 1655    Clinical Impression Statement  Pt is progressing slowly towards goals. Pt's wife verbalizes understanding of ball exercises and coordination exercises and she can assist pt.    Occupational Profile and client history currently impacting functional performance  Pt was independent with ADLS prior to his triceps tear in February, now he requires assistance with ADLS and has had several falls.GLO:[id number]'J disease(diagnosed with parkinsonism 2012)triceps tear Feb 2019,R TKR 2016, L TKR 2017, anxiety, arthritis    Occupational performance deficits (Please refer to evaluation for details):  IADL's;ADL's;Leisure;Social Participation    Rehab Potential  Fair    Current Impairments/barriers affecting progress:  length of time since onset, severity of deficits, cognitive deficits    OT Frequency  2x / week    OT Duration  12 weeks    OT Treatment/Interventions  Self-care/ADL training;Therapeutic exercise;Gait Training;Aquatic Therapy;Moist Heat;Paraffin;Neuromuscular education;Balance training;Patient/family education;Therapeutic activities;Functional Mobility Training;Fluidtherapy;Cryotherapy;Ultrasound;Contrast Bath;DME and/or AE instruction;Manual Therapy;Passive range of motion;Cognitive  remediation/compensation    Plan  continue to reinforce ADL strategies    Consulted and Agree with Plan of Care  Patient;Family member/caregiver    Family Member Consulted  wife       Patient will benefit from skilled therapeutic intervention in order to improve the following deficits and impairments:  Abnormal gait, Decreased cognition, Impaired flexibility, Pain, Decreased coordination, Decreased mobility, Decreased strength, Decreased range of motion, Decreased activity tolerance, Decreased balance, Decreased endurance, Decreased  knowledge of precautions, Decreased safety awareness, Difficulty walking, Impaired perceived functional ability, Impaired UE functional use  Visit Diagnosis: Other symptoms and signs involving the nervous system  Muscle weakness (generalized)  Other lack of coordination  Frontal lobe and executive function deficit    Problem List Patient Active Problem List   Diagnosis Date Noted  . Rupture of right triceps tendon 05/20/2017  . Triceps tendon rupture, right, initial encounter 05/20/2017  . Atrial fibrillation ([place]) [I48.91] 02/05/2017  . Urinary retention 12/31/2015  . Acute renal failure (ARF) ([facility]) 12/31/2015  . Acute encephalopathy 12/31/2015  . Primary osteoarthritis of right knee 07/12/2015  . Primary osteoarthritis of left knee 11/30/2014  . Encounter for therapeutic drug monitoring 06/21/2013  . Edema of extremities 02/04/2013  . Chronic anticoagulation 02/04/2013  . GERD (gastroesophageal reflux disease)   . Restrictive lung disease   . CKD (chronic kidney disease) stage 2, GFR 60-89 ml/min   . Cellulitis   . Allergic rhinitis   . Anxiety   . Long term (current) use of anticoagulants 01/25/2013  . Parkinsonism ([place]) 06/22/2012  . Paresthesias 06/22/2012  . PULMONARY NODULE 06/02/2008  . SNORING 06/02/2008  . Hyperlipidemia 05/15/2008  . Essential hypertension 05/15/2008  . PULMONARY FUNCTION TESTS, ABNORMAL 05/15/2008     Brian [name] 02/25/2018, 4:57 PM  [facility] [address] [place], [place], [address] Phone: [phone]   Fax:  [phone]  Name: Brian [name] MRN: [id number] Date of Birth: 10/27/1940

## 2018-02-26 ENCOUNTER — Encounter: Payer: Self-pay | Admitting: Physical Therapy

## 2018-02-26 NOTE — Therapy (Signed)
[facility] [address] [place], [place], [address] Phone: [phone]   Fax:  [phone]  Physical Therapy Treatment  Patient Details  Name: Brian [name] MRN: [id number] Date of Birth: [birth year] Referring Provider (PT): Rexene Alberts   Encounter Date: 02/25/2018  PT End of Session - 02/26/18 2129    Visit Number  8    Number of Visits  18    Date for PT Re-Evaluation  04/21/18    Authorization Type  Medicare and [place] Time  6222    PT Stop Time  1455    PT Time Calculation (min)  51 min    Equipment Utilized During Treatment  Gait belt    Activity Tolerance  Patient tolerated treatment well   Needs max cues for correct performance of transfers   Behavior During Therapy  [facility] for tasks assessed/performed;Flat affect       Past Medical History:  Diagnosis Date  . Abnormal PFT 1. 05/18/08  2. 11/30/08   1. Showed mild airflow obstruction, mild restriction, mild diffusion defect; FEV1 2.22(64%), FVC 3.33(65%), FEVi% 67, TLC 5.19(69%), DLCO 77%, +BD  2. FEV1 2.38(73%), FVC 3.81(80%), FEV1% 63, TLC 5.61(80%), DLCO 79%, no BD  . Allergic rhinitis   . Anxiety   . BPH (benign prostatic hyperplasia)   . Cellulitis    right leg MRSA  . Erectile dysfunction   . GERD (gastroesophageal reflux disease)   . Heart murmur    mild MR by echo  . Hyperlipidemia   . Hypertension   . Osteoarthritis   . Paresthesias 06/22/2012  . Parkinsonism ([place]) 06/22/2012  . Permanent atrial fibrillation   . Restrictive lung disease     Past Surgical History:  Procedure Laterality Date  . CARDIAC CATHETERIZATION  11/2009   normal coronary arteries  . CARDIOVERSION     multiple  . CATARACT EXTRACTION    . COLONOSCOPY    . COLONOSCOPY WITH PROPOFOL N/A 09/02/2016   Procedure: COLONOSCOPY WITH PROPOFOL;  Surgeon: Garlan Fair, MD;  Location: [facility];  Service: Endoscopy;  Laterality: N/A;  . EYE SURGERY     bilateral  cataract extraction  . JOINT REPLACEMENT    . TOTAL KNEE ARTHROPLASTY Left 11/30/2014   Procedure: TOTAL KNEE ARTHROPLASTY;  Surgeon: Susa Day, MD;  Location: [facility];  Service: Orthopedics;  Laterality: Left;  . TOTAL KNEE ARTHROPLASTY Right 07/12/2015   Procedure: RIGHT TOTAL KNEE ARTHROPLASTY;  Surgeon: Susa Day, MD;  [facility];  Service: Orthopedics;  Laterality: Right;  . TRICEPS TENDON REPAIR Right 05/20/2017  . TRICEPS TENDON REPAIR Right 05/20/2017   Procedure: TRICEPS TENDON REPAIR;  Surgeon: Iran Planas, MD;  Location: [place];  Service: Orthopedics;  Laterality: Right;    There were no vitals filed for this visit.  Subjective Assessment - 02/26/18 2117    Subjective  The last two days have been very rough.  Wife reports getting off schedule with medications.    Patient is accompained by:  Family member    Pertinent History  Fall February 2019 with tricep tendon tear, s/p 3 months [facility] and then [facility] therapies, L TKR 9/216, R TKR 06/2015    Patient Stated Goals  Pt's goal for PT is to drive car again, to help with balance.    Currently in Pain?  No/denies  [facility] Adult PT Treatment/Exercise - 02/26/18 0001      Transfers   Transfers  Sit to Stand;Stand to Sit    Sit to Stand  4: Min assist;4: Min guard   Pt needs min assist the last 1/4 of the way to stand   Sit to Stand Details  Verbal cues for sequencing;Verbal cues for technique    Sit to Stand Details (indicate cue type and reason)  Tried scooting to edge of mat, x 3 reps, using technique of bringing body forward, weight over feet, then lifting bottom and coming to edge of mat, versus pt's technique of bringing feet forward, excess slumped posture and scooting buttocks forward.  Tried this different way in attempts to get him more forward focused for ultimate transfer to stand.    Stand to Sit  4: Min guard    Number of Reps  Other sets (comment)   3 sets of 5 reps    Comments  Pre-transfers:  using U-step rolling walker as a guide, had patient perform forward reach to seat of walker, 3 second hold, then relax to upright sitting (to encourage increased anterior weigthshift to stand).  Practiced slightly varied techniques, such as pushing up to stand with L hand on walker, R hand on mat, but pt needs max cues for technique.  Please note, transfer surface was from 20" mat, with bolster on each side for "armrests" simulating armrests of Allie Bossier chair pt sits in at home.      Ambulation/Gait   Ambulation/Gait  Yes    Ambulation/Gait Assistance  4: Min guard;4: Min assist    Ambulation/Gait Assistance Details  Max cues for increased step length, to stop when festination begins, to start again with big steps, big effort.  Cues for marching/stomping in place to turn to sit.    Ambulation Distance (Feet)  80 Feet   60   Assistive device  --   U-Step RW (clinic's)-pt brough in his RW   Gait Pattern  Decreased step length - right;Decreased step length - left;Right flexed knee in stance;Shuffle;Festinating;Trunk flexed;Narrow base of support;Poor foot clearance - right;Decreased hip/knee flexion - right;Decreased hip/knee flexion - left;Right foot flat;Left foot flat    Ambulation Surface  Level;Indoor    Gait Comments  Pt and wife want to try clinic's U-step RW, as wife feels pt is getting confused with using RW in clinic and U-step at home (Wife is not able to lift their personal U-step RW and bring into clinic; therefore, pt has been arriving with his own rolling walker and coming into clinic with that).       Self Care:  Discussed importance of medications on time, every time, with wife aware of need to contact MD if pt is continueing to have significant difficulty with transfers and mobility (as he had in the past several days) once medications get fully back on schedule.  Discussed safety concerns regarding pt being home alone for periods while wife at work.   She has family coming in, but there is some time he is by himself.  Discussed safety concerns with transfers, with sequencing/processing of transfer technique and use of U-step RW (hand placement, tending to leave it and walk away from it to try to sit at chair).  Discussed lifeline or Life Alert in case patient is alone and falls.  Discussed slowed processing in relation to Parkinson's disease, and how patient will likely benefit from external cues (visual, tactile, or auditory cues from family) in order  to complete correct sequence for transfers and for resetting with freezing episodes with gait.      PT Education - 02/26/18 2128    Education Details  Transfer training-technique, safety, need for cues and likely assistance.  Discussed with patient and wife that pt is likely not safe to be at home on his own, needs cues (at the least) and likely minimal assistance for safety with transfers.    Person(s) Educated  Patient;Spouse    Methods  Explanation;Demonstration;Tactile cues;Verbal cues    Comprehension  Verbalized understanding;Returned demonstration;Verbal cues required       PT Short Term Goals - 02/26/18 2137      PT SHORT TERM GOAL #1   Title  Pt will perform HEP with wife's supervision for improved posture, transfers, balance, gait.  TARGET 02/26/18    Time  5    Period  Weeks    Status  New      PT SHORT TERM GOAL #2   Title  Pt will perform at least 6 of 10 reps of sit<>stand with minimal UE support for improved transfer efficiency and safety.    Time  5    Period  Weeks    Status  Not Met      PT SHORT TERM GOAL #3   Title  Pt will improve TUG score to less than or equal to 70 seconds for decreased fall risk.    Time  5    Period  Weeks    Status  New      PT SHORT TERM GOAL #4   Title  Pt/wife will demonstrate understanding of tips to reduce freezing with gait and turns.    Time  5    Period  Weeks    Status  Not Met        PT Long Term Goals - 01/22/18 1247       PT LONG TERM GOAL #1   Title  Pt/wife will verbalize understanding of fall prevention to decrease fall risk in home environment.  TARGET 03/26/2018    Time  9    Period  Weeks    Status  New    Target Date  03/26/18      PT LONG TERM GOAL #2   Title  Pt will perform at least 10 reps of sit<>stand with minimal to no UE support, modified independently for improved transfer efficiency and safety.    Time  9    Period  Weeks    Status  New    Target Date  03/26/18      PT LONG TERM GOAL #3   Title  Pt will improve TUG score to less than or equal to 50 seconds for decreased fall risk.    Time  9    Period  Weeks    Status  New    Target Date  03/26/18      PT LONG TERM GOAL #4   Title  Pt will improve gait velocity to at least 1.8 ft/sec for decreased fall risk, improved gait efficiency and safety.    Time  9    Period  Weeks    Status  New    Target Date  03/26/18      PT LONG TERM GOAL #5   Title  Berg balance test to be assessed, with goal to be written as appropriate.    Time  9    Period  Weeks    Status  New  Target Date  03/26/18            Plan - 02/26/18 2131    Clinical Impression Statement  Attempted to assess STGs this visit, but most of visit spent focusing on transfer technique, transfer safety, as wife concerned that pt is having more trouble getting up from his chair on his own in the past several days (he tends to have posterior lean at the last 1/4 portion of sit>stand, needing min assist to fully stand without posterior lean).  Had frank discussion with patient and wife regarding safety concerns with patient home for several hours on his own while wife is at work.  Pt needs max verbal cues for correct technique of transfers, and today, needs min assist for nearly every rep of sit to stand.  Wife reports she will be trying to get more family in more often to be with him.  Discussed medication management, needing to take medications on time, everytime, as  pt/wife feel he has gotten off schedule and has made it more difficult for him to move the past several days.  STG 2 not met, STG 4 not met (wife does not give any cues, pt cannot recall cues; was previously instructed in tips to reduce freezing and PT provides max cues.)  Pt will continue to benefit from further instruction for improved safety with transfers and gait.    Rehab Potential  Good    Clinical Impairments Affecting Rehab Potential  supportive wife; cognition/severity of PD deficits    PT Frequency  2x / week    PT Duration  Other (comment)   9 weeks   PT Treatment/Interventions  ADLs/Self Care Home Management;DME Instruction;Gait training;Stair training;Functional mobility training;Therapeutic activities;Therapeutic exercise;Balance training;Patient/family education;Neuromuscular re-education    PT Next Visit Plan  use w/c from lobby or SLP office to gym; try to use U-step walker instead of RW; check STG 1 and 3.  Transfer training, focusing on fwd transition and then terminal knee extension; standing (modified PWR! Moves) to increase step length and posture; gait training for increased step length and posture.    Consulted and Agree with Plan of Care  Patient;Family member/caregiver    Family Member Consulted  wife, [name]        Patient will benefit from skilled therapeutic intervention in order to improve the following deficits and impairments:  Abnormal gait, Decreased balance, Decreased coordination, Decreased mobility, Decreased safety awareness, Difficulty walking, Decreased strength, Impaired flexibility, Postural dysfunction  Visit Diagnosis: Other symptoms and signs involving the nervous system  Other abnormalities of gait and mobility  Abnormal posture     Problem List Patient Active Problem List   Diagnosis Date Noted  . Rupture of right triceps tendon 05/20/2017  . Triceps tendon rupture, right, initial encounter 05/20/2017  . Atrial fibrillation ([place]) [I48.91]  02/05/2017  . Urinary retention 12/31/2015  . Acute renal failure (ARF) ([place]) 12/31/2015  . Acute encephalopathy 12/31/2015  . Primary osteoarthritis of right knee 07/12/2015  . Primary osteoarthritis of left knee 11/30/2014  . Encounter for therapeutic drug monitoring 06/21/2013  . Edema of extremities 02/04/2013  . Chronic anticoagulation 02/04/2013  . GERD (gastroesophageal reflux disease)   . Restrictive lung disease   . CKD (chronic kidney disease) stage 2, GFR 60-89 ml/min   . Cellulitis   . Allergic rhinitis   . Anxiety   . Long term (current) use of anticoagulants 01/25/2013  . Parkinsonism ([place]) 06/22/2012  . Paresthesias 06/22/2012  . PULMONARY NODULE 06/02/2008  .  SNORING 06/02/2008  . Hyperlipidemia 05/15/2008  . Essential hypertension 05/15/2008  . PULMONARY FUNCTION TESTS, ABNORMAL 05/15/2008    Donnae Michels W. 02/26/2018, 9:38 PM  Frazier Butt., PT   [facility] [address] [place], [place], [address] Phone: [phone]   Fax:  [phone]  Name: Brian [name] MRN: [id number] Date of Birth: Apr 02, 1940

## 2018-03-01 ENCOUNTER — Ambulatory Visit: Payer: Medicare Other | Admitting: Physical Therapy

## 2018-03-01 ENCOUNTER — Ambulatory Visit: Payer: Medicare Other | Admitting: Occupational Therapy

## 2018-03-01 ENCOUNTER — Encounter: Payer: Self-pay | Admitting: Occupational Therapy

## 2018-03-01 ENCOUNTER — Ambulatory Visit: Payer: Medicare Other

## 2018-03-01 DIAGNOSIS — R29818 Other symptoms and signs involving the nervous system: Secondary | ICD-10-CM

## 2018-03-01 DIAGNOSIS — R293 Abnormal posture: Secondary | ICD-10-CM

## 2018-03-01 DIAGNOSIS — R2681 Unsteadiness on feet: Secondary | ICD-10-CM

## 2018-03-01 DIAGNOSIS — R1310 Dysphagia, unspecified: Secondary | ICD-10-CM | POA: Diagnosis not present

## 2018-03-01 DIAGNOSIS — R41844 Frontal lobe and executive function deficit: Secondary | ICD-10-CM

## 2018-03-01 DIAGNOSIS — R2689 Other abnormalities of gait and mobility: Secondary | ICD-10-CM

## 2018-03-01 DIAGNOSIS — M6281 Muscle weakness (generalized): Secondary | ICD-10-CM | POA: Diagnosis not present

## 2018-03-01 DIAGNOSIS — R471 Dysarthria and anarthria: Secondary | ICD-10-CM | POA: Diagnosis not present

## 2018-03-01 DIAGNOSIS — R278 Other lack of coordination: Secondary | ICD-10-CM

## 2018-03-01 NOTE — Patient Instructions (Signed)
   Tips for Talking with People who have Aphasia  . Say one thing at a time . Don't  rush - slow down, be patient . Talk face to face . Reduce background noise . Relax - be natural . Use pen and paper . Write down key words . Draw diagrams or pictures . Don't pretend you understand . Ask what helps . Recap - check you both understand . Be a partner, not a therapist  . Ask yes/no questions to limit the need for more verbose answers  Aphasia does not affect intelligence, only language. The person with aphasia can still: make decisions, have opinions, and socialize.   Describing words  What group does it belong to?  What do I use it for?  Where can I find it?  What does it LOOK like?  What other words go with it?  What is the 1st sound of the word?   Many Ways to Communicate  Describe the idea/item Write down the word Draw it Gesture it Point to it - sometimes a simple communication board is helpful, with pictures of common items Use related words to help the listener "guess" for you

## 2018-03-01 NOTE — Therapy (Signed)
[facility] [address] [place], [place], [address] Phone: [phone]   Fax:  [phone]  Occupational Therapy Treatment  Patient Details  Name: Brian [name] MRN: [id number] Date of Birth: Aug 09, 1940 Referring Provider (OT): Dr. Rexene Alberts   Encounter Date: 03/01/2018  OT End of Session - 03/01/18 1407    Visit Number  7    Number of Visits  25    Date for OT Re-Evaluation  04/22/18    Authorization Type  Medicare    Authorization Time Period  cert 12 weeks, anticipate d/c following 8 weeks    Authorization - Visit Number  7    Authorization - Number of Visits  10    OT Start Time  1406    OT Stop Time  1450    OT Time Calculation ([name])  44 [name]    Activity Tolerance  Patient tolerated treatment well    Behavior During Therapy  [facility] for tasks assessed/performed;Flat affect       Past Medical History:  Diagnosis Date  . Abnormal PFT 1. 05/18/08  2. 11/30/08   1. Showed mild airflow obstruction, mild restriction, mild diffusion defect; FEV1 2.22(64%), FVC 3.33(65%), FEVi% 67, TLC 5.19(69%), DLCO 77%, +BD  2. FEV1 2.38(73%), FVC 3.81(80%), FEV1% 63, TLC 5.61(80%), DLCO 79%, no BD  . Allergic rhinitis   . Anxiety   . BPH (benign prostatic hyperplasia)   . Cellulitis    right leg MRSA  . Erectile dysfunction   . GERD (gastroesophageal reflux disease)   . Heart murmur    mild MR by echo  . Hyperlipidemia   . Hypertension   . Osteoarthritis   . Paresthesias 06/22/2012  . Parkinsonism ([place]) 06/22/2012  . Permanent atrial fibrillation   . Restrictive lung disease     Past Surgical History:  Procedure Laterality Date  . CARDIAC CATHETERIZATION  11/2009   normal coronary arteries  . CARDIOVERSION     multiple  . CATARACT EXTRACTION    . COLONOSCOPY    . COLONOSCOPY WITH PROPOFOL N/A 09/02/2016   Procedure: COLONOSCOPY WITH PROPOFOL;  Surgeon: Garlan Fair, MD;  [facility];  Service: Endoscopy;   Laterality: N/A;  . EYE SURGERY     bilateral cataract extraction  . JOINT REPLACEMENT    . TOTAL KNEE ARTHROPLASTY Left 11/30/2014   Procedure: TOTAL KNEE ARTHROPLASTY;  Surgeon: Susa Day, MD;  [facility];  Service: Orthopedics;  Laterality: Left;  . TOTAL KNEE ARTHROPLASTY Right 07/12/2015   Procedure: RIGHT TOTAL KNEE ARTHROPLASTY;  Surgeon: Susa Day, MD;  [facility];  Service: Orthopedics;  Laterality: Right;  . TRICEPS TENDON REPAIR Right 05/20/2017  . TRICEPS TENDON REPAIR Right 05/20/2017   Procedure: TRICEPS TENDON REPAIR;  Surgeon: Iran Planas, MD;  Location: [facility];  Service: Orthopedics;  Laterality: Right;    There were no vitals filed for this visit.  Subjective Assessment - 03/01/18 1407    Subjective   Pt reports that he was able to don socks with sock aid at home    Patient Stated Goals  to be as independent as he can, to be able dress himself    Currently in Pain?  No/denies       PWR! Hands x10 with mod cueing for incr movement amplitude.  Placing medium pegs in pegboard with each hand with [name]-mod cues for PWR! Hand prior to grasp for incr coordination, mod difficulty. Pt appears to demo difficulty with visual-perceptual requirements of  task.  Pt reports diplopia at times, but not during task.  Attempted fastening buttons, pt with incr time/max difficulty.  Practiced using R hand to push button through hole once button aligned in hole x3 with significant effort, mod cueing.  Improved with repetition.  (pt noted with decr R hand movement/functional use incr difficulty for this bilateral task).  Pt shown button hook and practiced with OT providing hand-over-hand assist initially, then [name] facilitation/cues.  Pt reports fatigue and that he will no plan to wear any buttons.  (wearing button-up shirt today open as "light jacket" only).  Sit>stand to transfer w/c>chair and chair>w/c with mod cueing for feet placement and scooting.  Needed [name]-mod assist  with first transfer, [name] A 2nd transfer.    Assisted wife transfer pt w/c>car at end of session with [name] A, mod cueing, particularly for feet placement and to scoot forward.         OT Short Term Goals - 03/01/18 1408      OT SHORT TERM GOAL #1   Title  Pt/ caregeiver willl be I with PD specific HEP    Status  On-going      OT SHORT TERM GOAL #2   Title  Pt /wife will verbalize understanding of adapted strategies for ADLS/ IADLs to maximize safety and independence.    Status  On-going      OT SHORT TERM GOAL #3   Title  Pt will perfom LB dressing with [name] A    Status  On-going      OT SHORT TERM GOAL #4   Title  Pt will demonstrate improved fine motor coordination as evidenced by decreasing 9 hole pegs test by 4 secs bilaterally.    Status  On-going      OT SHORT TERM GOAL #5   Title  Pt/ wife will verbalize understanding of compensatory strategies for memory/ cognition    Status  On-going        OT Long Term Goals - 01/22/18 1246      OT LONG TERM GOAL #1   Title  Pt and wife will verbalize understanding of ways to prevent future complications and appropriate community resources    Time  12    Period  Weeks    Status  New    Target Date  04/22/18      OT LONG TERM GOAL #2   Title  Pt will demonstrate aiblity to retrieve a lightweight object at 115 shoulder flexion and -35 elbow extension with RUE.    Time  12    Period  Weeks    Status  New      OT LONG TERM GOAL #3   Title  Pt.will demonstrate ability to retrieve a lightweight obect at 120 shoulder flexion with LUE.    Time  12    Period  Weeks    Status  New      OT LONG TERM GOAL #4   Title  Pt will demonstrate improved gross motor coordination/ bilateral UE functional use for ADLs as evidenced by increasing box/ blocks score bilaterally by 3 blocks    Baseline  RUE 26 blocks, LUe 24 blocks     Time  12    Period  Weeks    Status  New      OT LONG TERM GOAL #5   Title  Pt will demonstrae ability to  fasten 3 buttons in 75 secs or less    Time  12  Period  Weeks    Status  New            Plan - 03/01/18 1408    Clinical Impression Statement  Pt is progressing slowly towards goals. Pt reports that he is able to don shoes and sock with sock aide at home.    Occupational Profile and client history currently impacting functional performance  Pt was independent with ADLS prior to his triceps tear in February, now he requires assistance with ADLS and has had several falls.JGG:EZMOQHUTM'L disease(diagnosed with parkinsonism 2012)triceps tear Feb 2019,R TKR 2016, L TKR 2017, anxiety, arthritis    Occupational performance deficits (Please refer to evaluation for details):  IADL's;ADL's;Leisure;Social Participation    Rehab Potential  Fair    Current Impairments/barriers affecting progress:  length of time since onset, severity of deficits, cognitive deficits    OT Frequency  2x / week    OT Duration  12 weeks    OT Treatment/Interventions  Self-care/ADL training;Therapeutic exercise;Gait Training;Aquatic Therapy;Moist Heat;Paraffin;Neuromuscular education;Balance training;Patient/family education;Therapeutic activities;Functional Mobility Training;Fluidtherapy;Cryotherapy;Ultrasound;Contrast Bath;DME and/or AE instruction;Manual Therapy;Passive range of motion;Cognitive remediation/compensation    Plan  continue to reinforce ADL strategies; begin checking STGs    OT Home Exercise Plan  Education provided:  Basic PD Coordination HEP, ball ex in seated , PWR! supine- bed mobility    Consulted and Agree with Plan of Care  Patient;Family member/caregiver    Family Member Consulted  wife       Patient will benefit from skilled therapeutic intervention in order to improve the following deficits and impairments:  Abnormal gait, Decreased cognition, Impaired flexibility, Pain, Decreased coordination, Decreased mobility, Decreased strength, Decreased range of motion, Decreased activity tolerance,  Decreased balance, Decreased endurance, Decreased knowledge of precautions, Decreased safety awareness, Difficulty walking, Impaired perceived functional ability, Impaired UE functional use  Visit Diagnosis: Other symptoms and signs involving the nervous system  Other abnormalities of gait and mobility  Abnormal posture  Muscle weakness (generalized)  Other lack of coordination  Frontal lobe and executive function deficit  Unsteadiness on feet    Problem List Patient Active Problem List   Diagnosis Date Noted  . Rupture of right triceps tendon 05/20/2017  . Triceps tendon rupture, right, initial encounter 05/20/2017  . Atrial fibrillation ([place]) [I48.91] 02/05/2017  . Urinary retention 12/31/2015  . Acute renal failure (ARF) ([place]) 12/31/2015  . Acute encephalopathy 12/31/2015  . Primary osteoarthritis of right knee 07/12/2015  . Primary osteoarthritis of left knee 11/30/2014  . Encounter for therapeutic drug monitoring 06/21/2013  . Edema of extremities 02/04/2013  . Chronic anticoagulation 02/04/2013  . GERD (gastroesophageal reflux disease)   . Restrictive lung disease   . CKD (chronic kidney disease) stage 2, GFR 60-89 ml/[name]   . Cellulitis   . Allergic rhinitis   . Anxiety   . Long term (current) use of anticoagulants 01/25/2013  . Parkinsonism ([place]) 06/22/2012  . Paresthesias 06/22/2012  . PULMONARY NODULE 06/02/2008  . SNORING 06/02/2008  . Hyperlipidemia 05/15/2008  . Essential hypertension 05/15/2008  . PULMONARY FUNCTION TESTS, ABNORMAL 05/15/2008    [facility] 03/01/2018, 3:10 PM  [facility] [address] [place], [place], [address] Phone: [phone]   Fax:  [phone]  Name: KEAHI MCCARNEY MRN: [id number] Date of Birth: October 04, 1940   Vianne Bulls, OTR/L [facility] [address] [place], [place]  [address] [phone] phone [phone] 03/01/18  3:10 PM

## 2018-03-01 NOTE — Therapy (Signed)
[facility] [address] [place], [place], [address] Phone: [phone]   Fax:  [phone]  Physical Therapy Treatment  Patient Details  Name: Brian [name] MRN: [id number] Date of Birth: 08-11-1940 Referring Provider (PT): Rexene Alberts   Encounter Date: 03/01/2018  PT End of Session - 03/01/18 1545    Visit Number  9    Number of Visits  18    Date for PT Re-Evaluation  04/21/18    Authorization Type  Medicare and [place] Time  1234    PT Stop Time  1318    PT Time Calculation (min)  44 min    Equipment Utilized During Treatment  Gait belt    Activity Tolerance  Patient tolerated treatment well    Behavior During Therapy  [facility] for tasks assessed/performed;Flat affect       Past Medical History:  Diagnosis Date  . Abnormal PFT 1. 05/18/08  2. 11/30/08   1. Showed mild airflow obstruction, mild restriction, mild diffusion defect; FEV1 2.22(64%), FVC 3.33(65%), FEVi% 67, TLC 5.19(69%), DLCO 77%, +BD  2. FEV1 2.38(73%), FVC 3.81(80%), FEV1% 63, TLC 5.61(80%), DLCO 79%, no BD  . Allergic rhinitis   . Anxiety   . BPH (benign prostatic hyperplasia)   . Cellulitis    right leg MRSA  . Erectile dysfunction   . GERD (gastroesophageal reflux disease)   . Heart murmur    mild MR by echo  . Hyperlipidemia   . Hypertension   . Osteoarthritis   . Paresthesias 06/22/2012  . Parkinsonism ([place]) 06/22/2012  . Permanent atrial fibrillation   . Restrictive lung disease     Past Surgical History:  Procedure Laterality Date  . CARDIAC CATHETERIZATION  11/2009   normal coronary arteries  . CARDIOVERSION     multiple  . CATARACT EXTRACTION    . COLONOSCOPY    . COLONOSCOPY WITH PROPOFOL N/A 09/02/2016   Procedure: COLONOSCOPY WITH PROPOFOL;  Surgeon: Garlan Fair, MD;  [facility]: [facility];  Service: Endoscopy;  Laterality: N/A;  . EYE SURGERY     bilateral cataract extraction  . JOINT REPLACEMENT    . TOTAL  KNEE ARTHROPLASTY Left 11/30/2014   Procedure: TOTAL KNEE ARTHROPLASTY;  Surgeon: Susa Day, MD;  Location: [facility];  Service: Orthopedics;  Laterality: Left;  . TOTAL KNEE ARTHROPLASTY Right 07/12/2015   Procedure: RIGHT TOTAL KNEE ARTHROPLASTY;  Surgeon: Susa Day, MD;  Location: [facility];  Service: Orthopedics;  Laterality: Right;  . TRICEPS TENDON REPAIR Right 05/20/2017  . TRICEPS TENDON REPAIR Right 05/20/2017   Procedure: TRICEPS TENDON REPAIR;  Surgeon: Iran Planas, MD;  Location: [facility];  Service: Orthopedics;  Laterality: Right;    There were no vitals filed for this visit.  Subjective Assessment - 03/01/18 1242    Subjective  If I had to stand up now without you helping me, I couldn't make it.    Patient is accompained by:  --    Pertinent History  Fall February 2019 with tricep tendon tear, s/p 3 months [facility] and then [facility] therapies, L TKR 9/216, R TKR 06/2015    Patient Stated Goals  Pt's goal for PT is to drive car again, to help with balance.    Currently in Pain?  No/denies                       [facility] Adult PT Treatment/Exercise - 03/01/18 0001  Transfers   Transfers  Sit to Stand;Stand to Sit    Sit to Stand  4: Min assist    Sit to Stand Details  Verbal cues for sequencing;Verbal cues for technique    Stand to Sit  4: Min guard    Number of Reps  --   5 reps through session   Comments  Pt performs sit to stand better today, with initial forward lean to stretch forward towards U-step seat (with cues)      Ambulation/Gait   Ambulation/Gait  Yes    Ambulation/Gait Assistance  4: Min guard;4: Min assist    Ambulation/Gait Assistance Details  Festination as approaching mat to sit and as approaching doorway for speech therapy.  Cues to stop, bring U-step walker closer to him and start again with big step.    Ambulation Distance (Feet)  80 Feet   100   Assistive device  --   U-step RW   Gait Pattern  Decreased step length -  right;Decreased step length - left;Right flexed knee in stance;Shuffle;Festinating;Trunk flexed;Narrow base of support;Poor foot clearance - right;Decreased hip/knee flexion - right;Decreased hip/knee flexion - left;Right foot flat;Left foot flat    Ambulation Surface  Level;Indoor    Stairs  Yes    Stairs Assistance  4: Min guard    Stair Management Technique  One rail Left;Step to pattern;Sideways   L rail ascending (both hands); R rail descending (both hands   Number of Stairs  4   x 2   Height of Stairs  6      Standardized Balance Assessment   Standardized Balance Assessment  Timed Up and Go Test      Timed Up and Go Test   TUG  Normal TUG    Normal TUG (seconds)  66.63   U-Step RW, 2nd trial 61     Exercises   Exercises  Knee/Hip;Ankle      Knee/Hip Exercises: Stretches   Active Hamstring Stretch  Right;Left;2 reps;30 seconds   Review of HEP-pt needs verbal cues for technique   Other Knee/Hip Stretches  Seated anterior/posterior pelvic tilt x 10 reps, with cues for initial forward scoot      Knee/Hip Exercises: Standing   Hip Flexion  Stengthening;Right;Left;2 sets;10 reps;Knee straight   Marching in place, cues for increased RLE step height     Knee/Hip Exercises: Seated   Long Arc Quad  AROM;Right;Left;2 sets;5 reps    Marching  AROM;Right;Left;2 sets;10 reps      Ankle Exercises: Seated   Toe Raise  10 reps;3 seconds      Practiced short distance gait with marching to turn-cues for increased intensity marching steps to increased foot clearance         PT Short Term Goals - 03/01/18 1546      PT SHORT TERM GOAL #1   Title  Pt will perform HEP with wife's supervision for improved posture, transfers, balance, gait.  TARGET 02/26/18    Baseline  Pt able to demo HEP with cues from therapist; at previous visit, wife reports pt is overwhelmed with his exercises-pt reports doing HEP only when he is reminded    Time  5    Period  Weeks    Status  Partially Met       PT SHORT TERM GOAL #2   Title  Pt will perform at least 6 of 10 reps of sit<>stand with minimal UE support for improved transfer efficiency and safety.    Time  5  Period  Weeks    Status  Not Met      PT SHORT TERM GOAL #3   Title  Pt will improve TUG score to less than or equal to 70 seconds for decreased fall risk.    Baseline  61 sec at best with U-step walker    Time  5    Period  Weeks    Status  Achieved      PT SHORT TERM GOAL #4   Title  Pt/wife will demonstrate understanding of tips to reduce freezing with gait and turns.    Time  5    Period  Weeks    Status  Not Met        PT Long Term Goals - 01/22/18 1247      PT LONG TERM GOAL #1   Title  Pt/wife will verbalize understanding of fall prevention to decrease fall risk in home environment.  TARGET 03/26/2018    Time  9    Period  Weeks    Status  New    Target Date  03/26/18      PT LONG TERM GOAL #2   Title  Pt will perform at least 10 reps of sit<>stand with minimal to no UE support, modified independently for improved transfer efficiency and safety.    Time  9    Period  Weeks    Status  New    Target Date  03/26/18      PT LONG TERM GOAL #3   Title  Pt will improve TUG score to less than or equal to 50 seconds for decreased fall risk.    Time  9    Period  Weeks    Status  New    Target Date  03/26/18      PT LONG TERM GOAL #4   Title  Pt will improve gait velocity to at least 1.8 ft/sec for decreased fall risk, improved gait efficiency and safety.    Time  9    Period  Weeks    Status  New    Target Date  03/26/18      PT LONG TERM GOAL #5   Title  Berg balance test to be assessed, with goal to be written as appropriate.    Time  9    Period  Weeks    Status  New    Target Date  03/26/18            Plan - 03/01/18 1547    Clinical Impression Statement  Reviewed pt's seated exercises (seated hamstring stretches, seated pelvic tilts and ankle pumps)-pt able to return demo with  picture and verbal cues, but pt reports he is only doing them when he is reminded at home.  Therefore, STG 1 partially met.  STG 3 met, with pt improving TUG score to 61 seconds (from 85 seconds at eval).  Performed additional seated and standing exercises as well as stair negoitation this visit, as pt reports having stairs at home.  He continues to demonstrate festination of gait-today with turns approaching sitting destination and doorways/thresholds, requiring cues for increased amplitude of RLE movements.  Wife not present at session today to follow up on transfer training from last visit.    Rehab Potential  Good    Clinical Impairments Affecting Rehab Potential  supportive wife; cognition/severity of PD deficits    PT Frequency  2x / week    PT Duration  Other (comment)   9 weeks  PT Treatment/Interventions  ADLs/Self Care Home Management;DME Instruction;Gait training;Stair training;Functional mobility training;Therapeutic activities;Therapeutic exercise;Balance training;Patient/family education;Neuromuscular re-education    PT Next Visit Plan  use w/c from lobby or SLP office to gym; try to use U-step walker instead of RW;  Transfer training, focusing on fwd transition and then terminal knee extension; standing (modified PWR! Moves) to increase step length and posture; gait training for increased step length and posture.    Consulted and Agree with Plan of Care  Patient    Family Member Consulted          Patient will benefit from skilled therapeutic intervention in order to improve the following deficits and impairments:  Abnormal gait, Decreased balance, Decreased coordination, Decreased mobility, Decreased safety awareness, Difficulty walking, Decreased strength, Impaired flexibility, Postural dysfunction  Visit Diagnosis: Abnormal posture  Muscle weakness (generalized)  Other abnormalities of gait and mobility     Problem List Patient Active Problem List   Diagnosis Date Noted   . Rupture of right triceps tendon 05/20/2017  . Triceps tendon rupture, right, initial encounter 05/20/2017  . Atrial fibrillation ([place]) [I48.91] 02/05/2017  . Urinary retention 12/31/2015  . Acute renal failure (ARF) ([place]) 12/31/2015  . Acute encephalopathy 12/31/2015  . Primary osteoarthritis of right knee 07/12/2015  . Primary osteoarthritis of left knee 11/30/2014  . Encounter for therapeutic drug monitoring 06/21/2013  . Edema of extremities 02/04/2013  . Chronic anticoagulation 02/04/2013  . GERD (gastroesophageal reflux disease)   . Restrictive lung disease   . CKD (chronic kidney disease) stage 2, GFR 60-89 ml/min   . Cellulitis   . Allergic rhinitis   . Anxiety   . Long term (current) use of anticoagulants 01/25/2013  . Parkinsonism ([place]) 06/22/2012  . Paresthesias 06/22/2012  . PULMONARY NODULE 06/02/2008  . SNORING 06/02/2008  . Hyperlipidemia 05/15/2008  . Essential hypertension 05/15/2008  . PULMONARY FUNCTION TESTS, ABNORMAL 05/15/2008    Shacoria Latif W. 03/01/2018, 3:52 PM Frazier Butt., PT  [facility] [address] [place], [place], [address] Phone: [phone]   Fax:  [phone]  Name: HUBBARD SELDON MRN: [id number] Date of Birth: [birth year]

## 2018-03-02 ENCOUNTER — Encounter: Payer: Medicare Other | Admitting: Occupational Therapy

## 2018-03-02 ENCOUNTER — Ambulatory Visit: Payer: Medicare Other | Admitting: Physical Therapy

## 2018-03-03 NOTE — Therapy (Signed)
[facility] [address] [place], [place], [address] Phone: [phone]   Fax:  [phone]  Speech Language Pathology Treatment  Patient Details  Name: Brian [name] MRN: [id number] Date of Birth: 05-14-1940 Referring Provider (SLP): Star Age, MD   Encounter Date: 03/01/2018  End of Session - 03/03/18 1705    Visit Number  8    Number of Visits  17    Date for SLP Re-Evaluation  04/21/18    SLP Start Time  1320    SLP Stop Time   1400    SLP Time Calculation (min)  40 min    Activity Tolerance  Patient tolerated treatment well       Past Medical History:  Diagnosis Date  . Abnormal PFT 1. 05/18/08  2. 11/30/08   1. Showed mild airflow obstruction, mild restriction, mild diffusion defect; FEV1 2.22(64%), FVC 3.33(65%), FEVi% 67, TLC 5.19(69%), DLCO 77%, +BD  2. FEV1 2.38(73%), FVC 3.81(80%), FEV1% 63, TLC 5.61(80%), DLCO 79%, no BD  . Allergic rhinitis   . Anxiety   . BPH (benign prostatic hyperplasia)   . Cellulitis    right leg MRSA  . Erectile dysfunction   . GERD (gastroesophageal reflux disease)   . Heart murmur    mild MR by echo  . Hyperlipidemia   . Hypertension   . Osteoarthritis   . Paresthesias 06/22/2012  . Parkinsonism ([place]) 06/22/2012  . Permanent atrial fibrillation   . Restrictive lung disease     Past Surgical History:  Procedure Laterality Date  . CARDIAC CATHETERIZATION  11/2009   normal coronary arteries  . CARDIOVERSION     multiple  . CATARACT EXTRACTION    . COLONOSCOPY    . COLONOSCOPY WITH PROPOFOL N/A 09/02/2016   Procedure: COLONOSCOPY WITH PROPOFOL;  Surgeon: Garlan Fair, MD;  [facility];  Service: Endoscopy;  Laterality: N/A;  . EYE SURGERY     bilateral cataract extraction  . JOINT REPLACEMENT    . TOTAL KNEE ARTHROPLASTY Left 11/30/2014   Procedure: TOTAL KNEE ARTHROPLASTY;  Surgeon: Susa Day, MD;  [facility];  Service: Orthopedics;   Laterality: Left;  . TOTAL KNEE ARTHROPLASTY Right 07/12/2015   Procedure: RIGHT TOTAL KNEE ARTHROPLASTY;  Surgeon: Susa Day, MD;  [facility];  Service: Orthopedics;  Laterality: Right;  . TRICEPS TENDON REPAIR Right 05/20/2017  . TRICEPS TENDON REPAIR Right 05/20/2017   Procedure: TRICEPS TENDON REPAIR;  Surgeon: Iran Planas, MD;  Location: [facility];  Service: Orthopedics;  Laterality: Right;    There were no vitals filed for this visit.  Subjective Assessment - 03/03/18 1709    Subjective  "Knee and ankle work, standing and sitting."    Currently in Pain?  No/denies            ADULT SLP TREATMENT - 03/03/18 0001      General Information   Behavior/Cognition  Alert;Cooperative;Pleasant mood      Treatment Provided   Treatment provided  Cognitive-Linquistic      Cognitive-Linquistic Treatment   Treatment focused on  Dysarthria    Skilled Treatment  SLP talked about anomia strategies and provided demonstrations, and finally, a pt handout for these. Pt read this list with upper 60s dB, with some usual word omission requiring SLP min-mod A.  SLP then put these ideas to practice for pt, asking him to describe more common and uncommon items. Pt 's loudness decr'd as pt focused more on content rather than  loudness. SLP provided pt with handout of aphasia tips to show his wife. SLP asked pt how he could compensate for word finding 20 minutes later and req'd to provide max cues for anomia strategies pt could use (with the ideas in front of him).       Assessment / Recommendations / Plan   Plan  Continue with current plan of care      Progression Toward Goals   Progression toward goals  Not progressing toward goals (comment)   decr'd cognition hinders progress        SLP Short Term Goals - 03/03/18 1708      SLP SHORT TERM GOAL #1   Title  Pt will generate loud /a/ with average of upper 80s dB over 3 sessions    Status  Achieved      SLP SHORT TERM GOAL #2   Title   Pt will use abdominal breathing at rest 85% success over 2 sessions    Time  1    Period  Weeks    Status  On-going      SLP SHORT TERM GOAL #3   Title  Pt will use speech volume average low 70sdB when responding with sentences 18/20 over two sessions    Time  1    Period  Weeks    Status  On-going      SLP SHORT TERM GOAL #4   Title  pt will tell SLP 3 anomia strategies over three sessions    Time  1    Period  Weeks    Status  On-going       SLP Long Term Goals - 03/03/18 1708      SLP LONG TERM GOAL #1   Title  pt will generate loud /a/ average low 90s dB over 6 sessions    Baseline  02-11-18    Time  5   or 17 visits, for all LTGs   Period  Weeks   or 17 visits, for all LTGs   Status  On-going      SLP LONG TERM GOAL #2   Title  Pt will use abdominal breathing in sentences or short conversation 80% success over 2 sessions    Time  5    Period  Weeks    Status  On-going      SLP LONG TERM GOAL #3   Title  pt will use anomia compensations in 75% of opportunities in sentence responses, with min verbal cues to use them, over 3 sessions    Time  5    Period  Weeks    Status  On-going      SLP LONG TERM GOAL #4   Title  pt will undergo objective swallow eval PRN    Time  2    Period  Weeks    Status  On-going       Plan - 03/03/18 1706    Clinical Impression Statement  Pt cont to present with dysarthria including reduced speech volume, today with practically all speech. Pt cont to exhibit slower processing today; SLP wonders if we are nearing the limit to what pt can do in therapy sessions with his loudness. No overt s/s aspiration PNA today. Pt may require a modified barium swallow or FEES at some point during this therapy course. He would benefit from skilled ST addressing these skills to communicate more efficiently.     Speech Therapy Frequency  2x / week    Duration  --  8 weeks or 17 visits   Potential to Achieve Goals  Good       Patient will benefit  from skilled therapeutic intervention in order to improve the following deficits and impairments:   Dysarthria and anarthria  Dysphagia, unspecified type    Problem List Patient Active Problem List   Diagnosis Date Noted  . Rupture of right triceps tendon 05/20/2017  . Triceps tendon rupture, right, initial encounter 05/20/2017  . Atrial fibrillation ([place]) [I48.91] 02/05/2017  . Urinary retention 12/31/2015  . Acute renal failure (ARF) ([place]) 12/31/2015  . Acute encephalopathy 12/31/2015  . Primary osteoarthritis of right knee 07/12/2015  . Primary osteoarthritis of left knee 11/30/2014  . Encounter for therapeutic drug monitoring 06/21/2013  . Edema of extremities 02/04/2013  . Chronic anticoagulation 02/04/2013  . GERD (gastroesophageal reflux disease)   . Restrictive lung disease   . CKD (chronic kidney disease) stage 2, GFR 60-89 ml/min   . Cellulitis   . Allergic rhinitis   . Anxiety   . Long term (current) use of anticoagulants 01/25/2013  . Parkinsonism ([place]) 06/22/2012  . Paresthesias 06/22/2012  . PULMONARY NODULE 06/02/2008  . SNORING 06/02/2008  . Hyperlipidemia 05/15/2008  . Essential hypertension 05/15/2008  . PULMONARY FUNCTION TESTS, ABNORMAL 05/15/2008    [facility] ,MS, CCC-SLP  03/03/2018, 5:10 PM  [facility] [address] [place], [place], [address] Phone: [phone]   Fax:  [phone]   Name: ANTONIOUS OMAHONEY MRN: [id number] Date of Birth: [birth year]

## 2018-03-04 ENCOUNTER — Ambulatory Visit: Payer: Medicare Other | Admitting: Speech Pathology

## 2018-03-04 ENCOUNTER — Ambulatory Visit: Payer: Medicare Other | Admitting: Occupational Therapy

## 2018-03-04 ENCOUNTER — Encounter: Payer: Self-pay | Admitting: Physical Therapy

## 2018-03-04 ENCOUNTER — Ambulatory Visit: Payer: Medicare Other | Admitting: Physical Therapy

## 2018-03-04 ENCOUNTER — Ambulatory Visit (INDEPENDENT_AMBULATORY_CARE_PROVIDER_SITE_OTHER): Payer: Medicare Other | Admitting: *Deleted

## 2018-03-04 DIAGNOSIS — R29818 Other symptoms and signs involving the nervous system: Secondary | ICD-10-CM

## 2018-03-04 DIAGNOSIS — I4891 Unspecified atrial fibrillation: Secondary | ICD-10-CM | POA: Diagnosis not present

## 2018-03-04 DIAGNOSIS — Z5181 Encounter for therapeutic drug level monitoring: Secondary | ICD-10-CM

## 2018-03-04 DIAGNOSIS — I4821 Permanent atrial fibrillation: Secondary | ICD-10-CM

## 2018-03-04 DIAGNOSIS — R293 Abnormal posture: Secondary | ICD-10-CM

## 2018-03-04 DIAGNOSIS — R2689 Other abnormalities of gait and mobility: Secondary | ICD-10-CM

## 2018-03-04 DIAGNOSIS — R41844 Frontal lobe and executive function deficit: Secondary | ICD-10-CM

## 2018-03-04 DIAGNOSIS — R2681 Unsteadiness on feet: Secondary | ICD-10-CM

## 2018-03-04 DIAGNOSIS — R471 Dysarthria and anarthria: Secondary | ICD-10-CM | POA: Diagnosis not present

## 2018-03-04 DIAGNOSIS — M6281 Muscle weakness (generalized): Secondary | ICD-10-CM | POA: Diagnosis not present

## 2018-03-04 DIAGNOSIS — R278 Other lack of coordination: Secondary | ICD-10-CM

## 2018-03-04 DIAGNOSIS — R1310 Dysphagia, unspecified: Secondary | ICD-10-CM | POA: Diagnosis not present

## 2018-03-04 LAB — POCT INR: INR: 2.7 (ref 2.0–3.0)

## 2018-03-04 NOTE — Therapy (Signed)
[facility] [address] [place], [place], [address] Phone: [phone]   Fax:  [phone]  Physical Therapy Treatment  Patient Details  Name: Brian [name] MRN: [id number] Date of Birth: 08/02/1940 Referring Provider (PT): Rexene Alberts   Encounter Date: 03/04/2018   10th visit progress note covers period 01/21/18 to 03/04/18  PT End of Session - 03/04/18 1713    Visit Number  10    Number of Visits  18    Date for PT Re-Evaluation  04/21/18    Authorization Type  Medicare and [place] Time  1533    PT Stop Time  1615    PT Time Calculation (min)  42 min    Equipment Utilized During Treatment  Gait belt    Activity Tolerance  Patient tolerated treatment well    Behavior During Therapy  [facility] for tasks assessed/performed;Flat affect       Past Medical History:  Diagnosis Date  . Abnormal PFT 1. 05/18/08  2. 11/30/08   1. Showed mild airflow obstruction, mild restriction, mild diffusion defect; FEV1 2.22(64%), FVC 3.33(65%), FEVi% 67, TLC 5.19(69%), DLCO 77%, +BD  2. FEV1 2.38(73%), FVC 3.81(80%), FEV1% 63, TLC 5.61(80%), DLCO 79%, no BD  . Allergic rhinitis   . Anxiety   . BPH (benign prostatic hyperplasia)   . Cellulitis    right leg MRSA  . Erectile dysfunction   . GERD (gastroesophageal reflux disease)   . Heart murmur    mild MR by echo  . Hyperlipidemia   . Hypertension   . Osteoarthritis   . Paresthesias 06/22/2012  . Parkinsonism ([place]) 06/22/2012  . Permanent atrial fibrillation   . Restrictive lung disease     Past Surgical History:  Procedure Laterality Date  . CARDIAC CATHETERIZATION  11/2009   normal coronary arteries  . CARDIOVERSION     multiple  . CATARACT EXTRACTION    . COLONOSCOPY    . COLONOSCOPY WITH PROPOFOL N/A 09/02/2016   Procedure: COLONOSCOPY WITH PROPOFOL;  Surgeon: Garlan Fair, MD;  [facility]: [facility];  Service: Endoscopy;  Laterality: N/A;  . EYE SURGERY     bilateral cataract extraction  . JOINT REPLACEMENT    . TOTAL KNEE ARTHROPLASTY Left 11/30/2014   Procedure: TOTAL KNEE ARTHROPLASTY;  Surgeon: Susa Day, MD;  Location: [facility];  Service: Orthopedics;  Laterality: Left;  . TOTAL KNEE ARTHROPLASTY Right 07/12/2015   Procedure: RIGHT TOTAL KNEE ARTHROPLASTY;  Surgeon: Susa Day, MD;  [facility];  Service: Orthopedics;  Laterality: Right;  . TRICEPS TENDON REPAIR Right 05/20/2017  . TRICEPS TENDON REPAIR Right 05/20/2017   Procedure: TRICEPS TENDON REPAIR;  Surgeon: Iran Planas, MD;  Location: [facility];  Service: Orthopedics;  Laterality: Right;    There were no vitals filed for this visit.  Subjective Assessment - 03/04/18 1537    Subjective  Pt reports his knees are sore (specifically denies pain) from trying multiple times to stand up from a chair at home yesterday.     Pertinent History  Fall February 2019 with tricep tendon tear, s/p 3 months [facility] and then [facility] therapies, L TKR 9/216, R TKR 06/2015    Patient Stated Goals  Pt's goal for PT is to drive car again, to help with balance.    Currently in Pain?  No/denies  [facility] Adult PT Treatment/Exercise - 03/04/18 1654      Transfers   Transfers  Sit to Stand;Stand to Sit    Sit to Stand  4: Min guard;Without upper extremity assist;From bed;From chair/3-in-1    Sit to Stand Details (indicate cue type and reason)  broke transfer down into steps and provided printout for him and caregivers to refer to; incr time practicing methods to scoot forward to edge of chair (WITHOUT pushing trunk backwards)    Stand to Sit  4: Min guard    Number of Reps  2 sets   5 reps   Comments  Pt with excellent anterior wtshift and controlled descent stand to sit; with cues able to set up for success for sit to stand      Ambulation/Gait   Ambulation/Gait Assistance  4: Min guard;4: Min assist    Ambulation/Gait Assistance Details  less festination  than when this therapist last worked with pt; less freezing episodes (2 in 40 ft, 4 in 70 ft) occasional cues to stop as freezing begins    Ambulation Distance (Feet)  40 Feet   70   Assistive device  Other (Comment)   U-step walker (clinic's)   Gait Pattern  Decreased step length - right;Decreased step length - left;Right flexed knee in stance;Shuffle;Festinating;Trunk flexed;Narrow base of support;Poor foot clearance - right;Decreased hip/knee flexion - right;Decreased hip/knee flexion - left;Right foot flat;Left foot flat        PWR ([facility]) - 03/04/18 1710    PWR! exercises  Moves in sitting    PWR! Up  10    Comments  repeated cues for technique (esp upright posture) modified to reach down for incr hip flexion, for decr fear of falling          PT Education - 03/04/18 1712    Education Details  provided handout for sequence of sit to stand    Person(s) Educated  Patient    Methods  Explanation;Demonstration;Verbal cues;Handout    Comprehension  Verbalized understanding;Returned demonstration;Verbal cues required;Need further instruction       PT Short Term Goals - 03/01/18 1546      PT SHORT TERM GOAL #1   Title  Pt will perform HEP with wife's supervision for improved posture, transfers, balance, gait.  TARGET 02/26/18    Baseline  Pt able to demo HEP with cues from therapist; at previous visit, wife reports pt is overwhelmed with his exercises-pt reports doing HEP only when he is reminded    Time  5    Period  Weeks    Status  Partially Met      PT SHORT TERM GOAL #2   Title  Pt will perform at least 6 of 10 reps of sit<>stand with minimal UE support for improved transfer efficiency and safety.    Time  5    Period  Weeks    Status  Not Met      PT SHORT TERM GOAL #3   Title  Pt will improve TUG score to less than or equal to 70 seconds for decreased fall risk.    Baseline  61 sec at best with U-step walker    Time  5    Period  Weeks    Status  Achieved      PT  SHORT TERM GOAL #4   Title  Pt/wife will demonstrate understanding of tips to reduce freezing with gait and turns.    Time  5    Period  Weeks    Status  Not Met        PT Long Term Goals - 01/22/18 1247      PT LONG TERM GOAL #1   Title  Pt/wife will verbalize understanding of fall prevention to decrease fall risk in home environment.  TARGET 03/26/2018    Time  9    Period  Weeks    Status  New    Target Date  03/26/18      PT LONG TERM GOAL #2   Title  Pt will perform at least 10 reps of sit<>stand with minimal to no UE support, modified independently for improved transfer efficiency and safety.    Time  9    Period  Weeks    Status  New    Target Date  03/26/18      PT LONG TERM GOAL #3   Title  Pt will improve TUG score to less than or equal to 50 seconds for decreased fall risk.    Time  9    Period  Weeks    Status  New    Target Date  03/26/18      PT LONG TERM GOAL #4   Title  Pt will improve gait velocity to at least 1.8 ft/sec for decreased fall risk, improved gait efficiency and safety.    Time  9    Period  Weeks    Status  New    Target Date  03/26/18      PT LONG TERM GOAL #5   Title  Berg balance test to be assessed, with goal to be written as appropriate.    Time  9    Period  Weeks    Status  New    Target Date  03/26/18            Plan - 03/04/18 1715    Clinical Impression Statement  Skilled session again focused on sit to stand transfers as pt continues to struggle with this transitional movement. Wife was present at the beginning of session to provide pt with his medicine and then went to lobby to attend to grandchild she is also caring for. Pt requested written instructions/steps for a successful transfer and provided. Additional time spent on gait training and techniques to reduce freezing.     Rehab Potential  Good    Clinical Impairments Affecting Rehab Potential  supportive wife; cognition/severity of PD deficits    PT Frequency  2x /  week    PT Duration  Other (comment)   9 weeks   PT Treatment/Interventions  ADLs/Self Care Home Management;DME Instruction;Gait training;Stair training;Functional mobility training;Therapeutic activities;Therapeutic exercise;Balance training;Patient/family education;Neuromuscular re-education    PT Next Visit Plan  use w/c from lobby or SLP office to gym; try to use U-step walker instead of RW;  Transfer training, focusing on fwd transition and then terminal knee extension (see written instructions pt gave 12/12--too wordy and can be revised! Review with pt's wife to see  if she has questions); gait training for increased step length and posture.    Consulted and Agree with Plan of Care  Patient    Family Member Consulted          Patient will benefit from skilled therapeutic intervention in order to improve the following deficits and impairments:  Abnormal gait, Decreased balance, Decreased coordination, Decreased mobility, Decreased safety awareness, Difficulty walking, Decreased strength, Impaired flexibility, Postural dysfunction  Visit Diagnosis: Other symptoms and signs involving the nervous system  Other abnormalities of gait and mobility  Abnormal posture     Problem List Patient Active Problem List   Diagnosis Date Noted  . Rupture of right triceps tendon 05/20/2017  . Triceps tendon rupture, right, initial encounter 05/20/2017  . Atrial fibrillation ([place]) [I48.91] 02/05/2017  . Urinary retention 12/31/2015  . Acute renal failure (ARF) ([place]) 12/31/2015  . Acute encephalopathy 12/31/2015  . Primary osteoarthritis of right knee 07/12/2015  . Primary osteoarthritis of left knee 11/30/2014  . Encounter for therapeutic drug monitoring 06/21/2013  . Edema of extremities 02/04/2013  . Chronic anticoagulation 02/04/2013  . GERD (gastroesophageal reflux disease)   . Restrictive lung disease   . CKD (chronic kidney disease) stage 2, GFR 60-89 ml/min   . Cellulitis   . Allergic  rhinitis   . Anxiety   . Long term (current) use of anticoagulants 01/25/2013  . Parkinsonism ([place]) 06/22/2012  . Paresthesias 06/22/2012  . PULMONARY NODULE 06/02/2008  . SNORING 06/02/2008  . Hyperlipidemia 05/15/2008  . Essential hypertension 05/15/2008  . PULMONARY FUNCTION TESTS, ABNORMAL 05/15/2008    Rexanne Mano, PT 03/04/2018, 5:28 PM  [facility] [address] [place], [place], [address] Phone: [phone]   Fax:  [phone]  Name: Brian [name] MRN: [id number] Date of Birth: 1940/07/20

## 2018-03-04 NOTE — Patient Instructions (Signed)
Description   Continue taking 1 tablet daily. Continue eating dark green leafy veggies three times a week. Recheck INR in 3 weeks. Call with any questions or concerns [facility] [phone]

## 2018-03-04 NOTE — Patient Instructions (Signed)
Steps for successful Sit to Stand   Step 1. Scoot forward to the edge of the seat.   -Lean your shoulders forward   -scoot one hip forward at a time-lean side to side and "walk your hips forward"  -OR you can lean forward lift hips slightly off the chair, and scoot both hips forward at the same time    Step 2. Set your feet (apart and underneath your knees)  Step 3. Lean forward (nose past your toes) and come to stand.  Step 4. Look straight ahead and stand tall.

## 2018-03-04 NOTE — Therapy (Signed)
[facility] [address] [place], [place], [address] Phone: [phone]   Fax:  [phone]  Speech Language Pathology Treatment  Patient Details  Name: Brian [name] MRN: [id number] Date of Birth: 12/04/1940 Referring Provider (SLP): Star Age, MD   Encounter Date: 03/04/2018  End of Session - 03/04/18 1753    Visit Number  9    Number of Visits  17    Date for SLP Re-Evaluation  04/21/18    SLP Start Time  1620    SLP Stop Time   1702    SLP Time Calculation (min)  42 min    Activity Tolerance  Patient tolerated treatment well       Past Medical History:  Diagnosis Date  . Abnormal PFT 1. 05/18/08  2. 11/30/08   1. Showed mild airflow obstruction, mild restriction, mild diffusion defect; FEV1 2.22(64%), FVC 3.33(65%), FEVi% 67, TLC 5.19(69%), DLCO 77%, +BD  2. FEV1 2.38(73%), FVC 3.81(80%), FEV1% 63, TLC 5.61(80%), DLCO 79%, no BD  . Allergic rhinitis   . Anxiety   . BPH (benign prostatic hyperplasia)   . Cellulitis    right leg MRSA  . Erectile dysfunction   . GERD (gastroesophageal reflux disease)   . Heart murmur    mild MR by echo  . Hyperlipidemia   . Hypertension   . Osteoarthritis   . Paresthesias 06/22/2012  . Parkinsonism ([place]) 06/22/2012  . Permanent atrial fibrillation   . Restrictive lung disease     Past Surgical History:  Procedure Laterality Date  . CARDIAC CATHETERIZATION  11/2009   normal coronary arteries  . CARDIOVERSION     multiple  . CATARACT EXTRACTION    . COLONOSCOPY    . COLONOSCOPY WITH PROPOFOL N/A 09/02/2016   Procedure: COLONOSCOPY WITH PROPOFOL;  Surgeon: Garlan Fair, MD;  [facility];  Service: Endoscopy;  Laterality: N/A;  . EYE SURGERY     bilateral cataract extraction  . JOINT REPLACEMENT    . TOTAL KNEE ARTHROPLASTY Left 11/30/2014   Procedure: TOTAL KNEE ARTHROPLASTY;  Surgeon: Susa Day, MD;  [facility];  Service: Orthopedics;   Laterality: Left;  . TOTAL KNEE ARTHROPLASTY Right 07/12/2015   Procedure: RIGHT TOTAL KNEE ARTHROPLASTY;  Surgeon: Susa Day, MD;  [facility];  Service: Orthopedics;  Laterality: Right;  . TRICEPS TENDON REPAIR Right 05/20/2017  . TRICEPS TENDON REPAIR Right 05/20/2017   Procedure: TRICEPS TENDON REPAIR;  Surgeon: Iran Planas, MD;  Location: [facility];  Service: Orthopedics;  Laterality: Right;    There were no vitals filed for this visit.  Subjective Assessment - 03/04/18 1624    Subjective  "Feeling better than I was yesterday."    Currently in Pain?  No/denies            ADULT SLP TREATMENT - 03/04/18 1620      General Information   Behavior/Cognition  Alert;Cooperative;Pleasant mood      Treatment Provided   Treatment provided  Cognitive-Linquistic      Pain Assessment   Pain Assessment  No/denies pain      Cognitive-Linquistic Treatment   Treatment focused on  Dysarthria    Skilled Treatment  Pt recalled discussing strategies for anomia last session; he recalled one strategy (describe what it looks like) with extended time. SLP reprinted handout with strategies and reviewed again with pt, providing examples/demonstrations. Pt required usual cues to refer to his handout for strategies when encountering anomia (x4 in mod complex  conversation). In simple conversation and sentence responses, pt loudness ranged from mid 60s to low 70s dB. Loudness deteriorated when pt was focused on content. SLP worked with pt on abdominal breathing at rest, with usual mod verbal/tactile cues required initially, fading to supervision. Pt required mod cues (visual, demonstration) for coordination of speech/exhalation when adding single words (names of grandchildren).      Assessment / Recommendations / Plan   Plan  Continue with current plan of care      Progression Toward Goals   Progression toward goals  Not progressing toward goals (comment)   decreased cognition hinders progress         SLP Short Term Goals - 03/04/18 1627      SLP SHORT TERM GOAL #1   Title  Pt will generate loud /a/ with average of upper 80s dB over 3 sessions    Status  Achieved      SLP SHORT TERM GOAL #2   Title  Pt will use abdominal breathing at rest 85% success over 2 sessions    Time  1    Period  Weeks    Status  Not Met      SLP SHORT TERM GOAL #3   Title  Pt will use speech volume average low 70sdB when responding with sentences 18/20 over two sessions    Time  1    Period  Weeks    Status  Not Met      SLP SHORT TERM GOAL #4   Title  pt will tell SLP 3 anomia strategies over three sessions    Time  1    Period  Weeks    Status  Not Met       SLP Long Term Goals - 03/04/18 1755      SLP LONG TERM GOAL #1   Title  pt will generate loud /a/ average low 90s dB over 6 sessions    Time  5    Period  Weeks   or 17 visits, for all LTGs   Status  On-going      SLP LONG TERM GOAL #2   Title  Pt will use abdominal breathing in sentences or short conversation 80% success over 2 sessions    Time  5    Period  Weeks    Status  On-going      SLP LONG TERM GOAL #3   Title  pt will use anomia compensations in 75% of opportunities in sentence responses, with min verbal cues to use them, over 3 sessions    Time  5    Period  Weeks    Status  On-going      SLP LONG TERM GOAL #4   Title  pt will undergo objective swallow eval PRN    Time  2    Period  Weeks    Status  On-going       Plan - 03/04/18 1754    Clinical Impression Statement  Pt cont to present with dysarthria including reduced speech volume, today with practically all speech. Pt cont to exhibit slower processing today; SLP wonders if we are nearing the limit to what pt can do in therapy sessions with his loudness. No overt s/s aspiration PNA today. Pt may require a modified barium swallow or FEES at some point during this therapy course. He would benefit from skilled ST addressing these skills to communicate more  efficiently.     Speech Therapy Frequency  2x /  week    Duration  --   8 weeks or 17 visits   Treatment/Interventions  Aspiration precaution training;Pharyngeal strengthening exercises;Diet toleration management by SLP;Language facilitation;Compensatory techniques;Internal/external aids;SLP instruction and feedback;Multimodal communcation approach;Functional tasks;Patient/family education;Compensatory strategies;Trials of upgraded texture/liquids;Cueing hierarchy;Environmental controls;Cognitive reorganization    Potential to Achieve Goals  Good       Patient will benefit from skilled therapeutic intervention in order to improve the following deficits and impairments:   Dysarthria and anarthria    Problem List Patient Active Problem List   Diagnosis Date Noted  . Rupture of right triceps tendon 05/20/2017  . Triceps tendon rupture, right, initial encounter 05/20/2017  . Atrial fibrillation ([place]) [I48.91] 02/05/2017  . Urinary retention 12/31/2015  . Acute renal failure (ARF) ([place]) 12/31/2015  . Acute encephalopathy 12/31/2015  . Primary osteoarthritis of right knee 07/12/2015  . Primary osteoarthritis of left knee 11/30/2014  . Encounter for therapeutic drug monitoring 06/21/2013  . Edema of extremities 02/04/2013  . Chronic anticoagulation 02/04/2013  . GERD (gastroesophageal reflux disease)   . Restrictive lung disease   . CKD (chronic kidney disease) stage 2, GFR 60-89 ml/min   . Cellulitis   . Allergic rhinitis   . Anxiety   . Long term (current) use of anticoagulants 01/25/2013  . Parkinsonism ([place]) 06/22/2012  . Paresthesias 06/22/2012  . PULMONARY NODULE 06/02/2008  . SNORING 06/02/2008  . Hyperlipidemia 05/15/2008  . Essential hypertension 05/15/2008  . PULMONARY FUNCTION TESTS, ABNORMAL 05/15/2008   Deneise Lever, [place], [place] Speech-Language Pathologist  Aliene Altes 03/04/2018, 5:56 PM  [facility] [address] [place], [place], [address] Phone: [phone]   Fax:  [phone]   Name: Brian [name] MRN: [id number] Date of Birth: [birth year]

## 2018-03-04 NOTE — Patient Instructions (Signed)
Describing words  What group does it belong to?  What do I use it for?  Where can I find it?  What does it LOOK like?  What other words go with it?  What is the 1st sound of the word?   Many Ways to Communicate  Describe the idea/item Write down the word Draw it Gesture it Point to it - sometimes a simple communication board is helpful, with pictures of common items Use related words to help the listener "guess" for you

## 2018-03-05 NOTE — Therapy (Signed)
[facility] [address] [place], [place], [address] Phone: [phone]   Fax:  [phone]  Occupational Therapy Treatment  Patient Details  Name: Brian [name] MRN: [id number] Date of Birth: 1940/03/28 Referring Provider (OT): Dr. Rexene Alberts   Encounter Date: 03/04/2018  OT End of Session - 03/05/18 1433    Visit Number  8    Number of Visits  25    Date for OT Re-Evaluation  04/22/18    Authorization Type  Medicare    Authorization Time Period  cert 12 weeks, anticipate d/c following 8 weeks    Authorization - Visit Number  8    Authorization - Number of Visits  10    OT Start Time  1450    OT Stop Time  1530    OT Time Calculation (min)  40 min    Activity Tolerance  Patient tolerated treatment well    Behavior During Therapy  [facility] for tasks assessed/performed;Flat affect       Past Medical History:  Diagnosis Date  . Abnormal PFT 1. 05/18/08  2. 11/30/08   1. Showed mild airflow obstruction, mild restriction, mild diffusion defect; FEV1 2.22(64%), FVC 3.33(65%), FEVi% 67, TLC 5.19(69%), DLCO 77%, +BD  2. FEV1 2.38(73%), FVC 3.81(80%), FEV1% 63, TLC 5.61(80%), DLCO 79%, no BD  . Allergic rhinitis   . Anxiety   . BPH (benign prostatic hyperplasia)   . Cellulitis    right leg MRSA  . Erectile dysfunction   . GERD (gastroesophageal reflux disease)   . Heart murmur    mild MR by echo  . Hyperlipidemia   . Hypertension   . Osteoarthritis   . Paresthesias 06/22/2012  . Parkinsonism ([place]) 06/22/2012  . Permanent atrial fibrillation   . Restrictive lung disease     Past Surgical History:  Procedure Laterality Date  . CARDIAC CATHETERIZATION  11/2009   normal coronary arteries  . CARDIOVERSION     multiple  . CATARACT EXTRACTION    . COLONOSCOPY    . COLONOSCOPY WITH PROPOFOL N/A 09/02/2016   Procedure: COLONOSCOPY WITH PROPOFOL;  Surgeon: Garlan Fair, MD;  [facility];  Service: Endoscopy;   Laterality: N/A;  . EYE SURGERY     bilateral cataract extraction  . JOINT REPLACEMENT    . TOTAL KNEE ARTHROPLASTY Left 11/30/2014   Procedure: TOTAL KNEE ARTHROPLASTY;  Surgeon: Susa Day, MD;  [facility];  Service: Orthopedics;  Laterality: Left;  . TOTAL KNEE ARTHROPLASTY Right 07/12/2015   Procedure: RIGHT TOTAL KNEE ARTHROPLASTY;  Surgeon: Susa Day, MD;  [facility];  Service: Orthopedics;  Laterality: Right;  . TRICEPS TENDON REPAIR Right 05/20/2017  . TRICEPS TENDON REPAIR Right 05/20/2017   Procedure: TRICEPS TENDON REPAIR;  Surgeon: Iran Planas, MD;  Location: [facility];  Service: Orthopedics;  Laterality: Right;    There were no vitals filed for this visit.  Subjective Assessment - 03/05/18 1432    Subjective   Pt's wife reports he is stilll not doing well. Therapist recommends pt's wife contacts MD.    Patient Stated Goals  to be as independent as he can, to be able dress himself    Currently in Pain?  No/denies             Treatment: Pt's wife reports pt is still not doing well, therapist encouraged pt's wife to contact MD. Pt transferred stand pivot to w/c with min A, min A for sit to stand, and v.c to  come forward Reviewed seated ball exercises for shoulder flexion and diagonals, min-mod v.c Practiced sit to stand for getting out of a chair as pt reports significant difficulty at home. Functional reaching mid-high level with trunk rotation, mod v.c for larger amplitude movements.                OT Short Term Goals - 03/01/18 1408      OT SHORT TERM GOAL #1   Title  Pt/ caregeiver willl be I with PD specific HEP    Status  On-going      OT SHORT TERM GOAL #2   Title  Pt /wife will verbalize understanding of adapted strategies for ADLS/ IADLs to maximize safety and independence.    Status  On-going      OT SHORT TERM GOAL #3   Title  Pt will perfom LB dressing with min A    Status  On-going      OT SHORT TERM GOAL #4   Title   Pt will demonstrate improved fine motor coordination as evidenced by decreasing 9 hole pegs test by 4 secs bilaterally.    Status  On-going      OT SHORT TERM GOAL #5   Title  Pt/ wife will verbalize understanding of compensatory strategies for memory/ cognition    Status  On-going        OT Long Term Goals - 01/22/18 1246      OT LONG TERM GOAL #1   Title  Pt and wife will verbalize understanding of ways to prevent future complications and appropriate community resources    Time  12    Period  Weeks    Status  New    Target Date  04/22/18      OT LONG TERM GOAL #2   Title  Pt will demonstrate aiblity to retrieve a lightweight object at 115 shoulder flexion and -35 elbow extension with RUE.    Time  12    Period  Weeks    Status  New      OT LONG TERM GOAL #3   Title  Pt.will demonstrate ability to retrieve a lightweight obect at 120 shoulder flexion with LUE.    Time  12    Period  Weeks    Status  New      OT LONG TERM GOAL #4   Title  Pt will demonstrate improved gross motor coordination/ bilateral UE functional use for ADLs as evidenced by increasing box/ blocks score bilaterally by 3 blocks    Baseline  RUE 26 blocks, LUe 24 blocks     Time  12    Period  Weeks    Status  New      OT LONG TERM GOAL #5   Title  Pt will demonstrae ability to fasten 3 buttons in 75 secs or less    Time  12    Period  Weeks    Status  New              Patient will benefit from skilled therapeutic intervention in order to improve the following deficits and impairments:     Visit Diagnosis: Other symptoms and signs involving the nervous system  Other abnormalities of gait and mobility  Abnormal posture  Muscle weakness (generalized)  Other lack of coordination  Frontal lobe and executive function deficit  Unsteadiness on feet    Problem List Patient Active Problem List   Diagnosis Date Noted  . Rupture of right  triceps tendon 05/20/2017  . Triceps tendon  rupture, right, initial encounter 05/20/2017  . Atrial fibrillation ([place]) [I48.91] 02/05/2017  . Urinary retention 12/31/2015  . Acute renal failure (ARF) ([place]) 12/31/2015  . Acute encephalopathy 12/31/2015  . Primary osteoarthritis of right knee 07/12/2015  . Primary osteoarthritis of left knee 11/30/2014  . Encounter for therapeutic drug monitoring 06/21/2013  . Edema of extremities 02/04/2013  . Chronic anticoagulation 02/04/2013  . GERD (gastroesophageal reflux disease)   . Restrictive lung disease   . CKD (chronic kidney disease) stage 2, GFR 60-89 ml/min   . Cellulitis   . Allergic rhinitis   . Anxiety   . Long term (current) use of anticoagulants 01/25/2013  . Parkinsonism ([place]) 06/22/2012  . Paresthesias 06/22/2012  . PULMONARY NODULE 06/02/2008  . SNORING 06/02/2008  . Hyperlipidemia 05/15/2008  . Essential hypertension 05/15/2008  . PULMONARY FUNCTION TESTS, ABNORMAL 05/15/2008    Leanor Voris 03/05/2018, 2:34 PM  [facility] [address] [place], [place], [address] Phone: [phone]   Fax:  [phone]  Name: Brian [name] MRN: [id number] Date of Birth: [birth year]

## 2018-03-09 ENCOUNTER — Encounter: Payer: Medicare Other | Admitting: Speech Pathology

## 2018-03-09 ENCOUNTER — Encounter: Payer: Medicare Other | Admitting: Occupational Therapy

## 2018-03-09 ENCOUNTER — Ambulatory Visit: Payer: Medicare Other | Admitting: Physical Therapy

## 2018-03-12 ENCOUNTER — Ambulatory Visit: Payer: Medicare Other

## 2018-03-12 ENCOUNTER — Encounter: Payer: Self-pay | Admitting: Physical Therapy

## 2018-03-12 ENCOUNTER — Ambulatory Visit: Payer: Medicare Other | Admitting: Physical Therapy

## 2018-03-12 ENCOUNTER — Ambulatory Visit: Payer: Medicare Other | Admitting: Occupational Therapy

## 2018-03-12 ENCOUNTER — Other Ambulatory Visit: Payer: Self-pay | Admitting: Neurology

## 2018-03-12 DIAGNOSIS — R2689 Other abnormalities of gait and mobility: Secondary | ICD-10-CM | POA: Diagnosis not present

## 2018-03-12 DIAGNOSIS — G4752 REM sleep behavior disorder: Secondary | ICD-10-CM

## 2018-03-12 DIAGNOSIS — R41844 Frontal lobe and executive function deficit: Secondary | ICD-10-CM

## 2018-03-12 DIAGNOSIS — R29818 Other symptoms and signs involving the nervous system: Secondary | ICD-10-CM

## 2018-03-12 DIAGNOSIS — R471 Dysarthria and anarthria: Secondary | ICD-10-CM

## 2018-03-12 DIAGNOSIS — M6281 Muscle weakness (generalized): Secondary | ICD-10-CM | POA: Diagnosis not present

## 2018-03-12 DIAGNOSIS — R293 Abnormal posture: Secondary | ICD-10-CM

## 2018-03-12 DIAGNOSIS — R278 Other lack of coordination: Secondary | ICD-10-CM

## 2018-03-12 DIAGNOSIS — R1310 Dysphagia, unspecified: Secondary | ICD-10-CM | POA: Diagnosis not present

## 2018-03-12 NOTE — Patient Instructions (Signed)
   Mrs. Baney-  We are all working on things that are going to be important for Mr. Flori to carryover to home and he will likely need some assistance with some/most of them due to his decreased memory skills. For this reason we wonder if you and Mr. Priola' cousin would be able to attend at least one set of therapy visits each week from this time forward?  As always please contact us should you have questions.   Thank you! [facility] [place] (Speech) Curt Bears Rine/ Vianne Bulls (OT) Jeani Hawking Sasser/ Amy [place] (PT)

## 2018-03-12 NOTE — Therapy (Addendum)
[facility] [address] [place], [place], [address] Phone: [phone]   Fax:  [phone]  Speech Language Pathology Treatment  Patient Details  Name: Brian [name] MRN: [id number] Date of Birth: 1940-06-05 Referring Provider (SLP): Star Age, MD   Encounter Date: 03/12/2018  End of Session - 03/12/18 1612    Visit Number  10    Number of Visits  17    Date for SLP Re-Evaluation  04/21/18    SLP Start Time  5790    SLP Stop Time   1446    SLP Time Calculation (min)  41 min    Activity Tolerance  Patient tolerated treatment well       Past Medical History:  Diagnosis Date  . Abnormal PFT 1. 05/18/08  2. 11/30/08   1. Showed mild airflow obstruction, mild restriction, mild diffusion defect; FEV1 2.22(64%), FVC 3.33(65%), FEVi% 67, TLC 5.19(69%), DLCO 77%, +BD  2. FEV1 2.38(73%), FVC 3.81(80%), FEV1% 63, TLC 5.61(80%), DLCO 79%, no BD  . Allergic rhinitis   . Anxiety   . BPH (benign prostatic hyperplasia)   . Cellulitis    right leg MRSA  . Erectile dysfunction   . GERD (gastroesophageal reflux disease)   . Heart murmur    mild MR by echo  . Hyperlipidemia   . Hypertension   . Osteoarthritis   . Paresthesias 06/22/2012  . Parkinsonism ([place]) 06/22/2012  . Permanent atrial fibrillation   . Restrictive lung disease     Past Surgical History:  Procedure Laterality Date  . CARDIAC CATHETERIZATION  11/2009   normal coronary arteries  . CARDIOVERSION     multiple  . CATARACT EXTRACTION    . COLONOSCOPY    . COLONOSCOPY WITH PROPOFOL N/A 09/02/2016   Procedure: COLONOSCOPY WITH PROPOFOL;  Surgeon: Garlan Fair, MD;  [facility];  Service: Endoscopy;  Laterality: N/A;  . EYE SURGERY     bilateral cataract extraction  . JOINT REPLACEMENT    . TOTAL KNEE ARTHROPLASTY Left 11/30/2014   Procedure: TOTAL KNEE ARTHROPLASTY;  Surgeon: Susa Day, MD;  [facility];  Service: Orthopedics;   Laterality: Left;  . TOTAL KNEE ARTHROPLASTY Right 07/12/2015   Procedure: RIGHT TOTAL KNEE ARTHROPLASTY;  Surgeon: Susa Day, MD;  [facility];  Service: Orthopedics;  Laterality: Right;  . TRICEPS TENDON REPAIR Right 05/20/2017  . TRICEPS TENDON REPAIR Right 05/20/2017   Procedure: TRICEPS TENDON REPAIR;  Surgeon: Iran Planas, MD;  Location: [facility];  Service: Orthopedics;  Laterality: Right;    There were no vitals filed for this visit.         ADULT SLP TREATMENT - 03/12/18 1430      General Information   Behavior/Cognition  Alert;Cooperative;Pleasant mood      Treatment Provided   Treatment provided  Cognitive-Linquistic      Cognitive-Linquistic Treatment   Treatment focused on  Aphasia;Dysarthria    Skilled Treatment  SLP reviewed anomia strategies - pt said "talk about it for awhile and it'll come to me" as a strategy. When SLP wrote down the 4 strategies pt stated he used rephrase/circunmlocution most often. During this segment today pt demo'd decr'd attention and/or memory as he made suggestions for word finding compensations that were off-topic or not feasible for anomia compensations. SLP then worked wiht pt to target abdominal breathing (AB); pt req'd mod cues usually, faded to min cues occasionally for abdomen rise/fall, and occasional faded to rare min A for  looking at hand to assess whether chest or AB. Pt became somnolent during this time.       Assessment / Recommendations / Plan   Plan  Continue with current plan of care      Progression Toward Goals   Progression toward goals  Progressing toward goals   abdominal breathing      SLP Education - 03/12/18 1612    Education Details  need wife and /or family to come to therapy    Person(s) Educated  Patient    Methods  Explanation;Handout    Comprehension  Verbalized understanding       SLP Short Term Goals - 03/04/18 1627      SLP SHORT TERM GOAL #1   Title  Pt will generate loud /a/ with  average of upper 80s dB over 3 sessions    Status  Achieved      SLP SHORT TERM GOAL #2   Title  Pt will use abdominal breathing at rest 85% success over 2 sessions    Time  1    Period  Weeks    Status  Not Met      SLP SHORT TERM GOAL #3   Title  Pt will use speech volume average low 70sdB when responding with sentences 18/20 over two sessions    Time  1    Period  Weeks    Status  Not Met      SLP SHORT TERM GOAL #4   Title  pt will tell SLP 3 anomia strategies over three sessions    Time  1    Period  Weeks    Status  Not Met       SLP Long Term Goals - 03/12/18 1613      SLP LONG TERM GOAL #1   Title  pt will generate loud /a/ average low 90s dB over 6 sessions    Baseline  02-11-18    Time  4    Period  Weeks   or 17 visits, for all LTGs   Status  On-going      SLP LONG TERM GOAL #2   Title  Pt will use abdominal breathing in sentences or short conversation 80% success over 2 sessions    Time  4    Period  Weeks    Status  On-going      SLP LONG TERM GOAL #3   Title  pt will use anomia compensations in 75% of opportunities in sentence responses, with min verbal cues to use them, over 3 sessions    Time  4    Period  Weeks    Status  On-going      SLP LONG TERM GOAL #4   Title  pt will undergo objective swallow eval PRN    Status  Deferred       Plan - 03/12/18 1613    Clinical Impression Statement  Pt cont to present with dysarthria including reduced speech volume, today with practically all speech output. Pt cont to exhibit slower processing today; SLP wonders if we are nearing the limit to what pt can do in therapy sessions with his loudness. He is performing abdominal breathing (AB) quite well at rest with fading cues. No overt s/s aspiration PNA today. Pt may require a modified barium swallow or FEES at some point during this therapy course. He would benefit from skilled ST addressing these skills to communicate more efficiently.     Speech Therapy  Frequency  2x / week    Duration  --   8 weeks or 17 visits   Treatment/Interventions  Aspiration precaution training;Pharyngeal strengthening exercises;Diet toleration management by SLP;Language facilitation;Compensatory techniques;Internal/external aids;SLP instruction and feedback;Multimodal communcation approach;Functional tasks;Patient/family education;Compensatory strategies;Trials of upgraded texture/liquids;Cueing hierarchy;Environmental controls;Cognitive reorganization    Potential to Achieve Goals  Good       Patient will benefit from skilled therapeutic intervention in order to improve the following deficits and impairments:   Dysarthria and anarthria   Speech Therapy Progress Note  Dates of Reporting Period: 01-21-18 to 03-12-18  Objective Reports of Subjective Statement: Pt has been seen for 10 visits targeting his speech volume. Pt has remained without overt s/s aspiration during ST when he has drunk H2O PO, and no overt s/s aspiration PNA.  Objective Measurements: Pt is performing better with abdominal breathing. Loudness in conversation has slightly improved. Loud /a/ is more consistently loud at this time.  Goal Update: See above.  Plan: Cont to see pt for 3-6 more sessions - begin to educate wife and caregivers on any safety precautions and carryover techniques for speech loudness that will most benefit pt.  Reason Skilled Services are Required: Train caregivers in carryover techniques that will most benefit pt, as well as cont to work on pt loudness in conversation in least restrictive manner. Family/caregiver cues may very well be necessary.    Problem List Patient Active Problem List   Diagnosis Date Noted  . Rupture of right triceps tendon 05/20/2017  . Triceps tendon rupture, right, initial encounter 05/20/2017  . Atrial fibrillation ([place]) [I48.91] 02/05/2017  . Urinary retention 12/31/2015  . Acute renal failure (ARF) ([place]) 12/31/2015  . Acute encephalopathy  12/31/2015  . Primary osteoarthritis of right knee 07/12/2015  . Primary osteoarthritis of left knee 11/30/2014  . Encounter for therapeutic drug monitoring 06/21/2013  . Edema of extremities 02/04/2013  . Chronic anticoagulation 02/04/2013  . GERD (gastroesophageal reflux disease)   . Restrictive lung disease   . CKD (chronic kidney disease) stage 2, GFR 60-89 ml/min   . Cellulitis   . Allergic rhinitis   . Anxiety   . Long term (current) use of anticoagulants 01/25/2013  . Parkinsonism ([place]) 06/22/2012  . Paresthesias 06/22/2012  . PULMONARY NODULE 06/02/2008  . SNORING 06/02/2008  . Hyperlipidemia 05/15/2008  . Essential hypertension 05/15/2008  . PULMONARY FUNCTION TESTS, ABNORMAL 05/15/2008    [facility] ,MS, CCC-SLP  03/12/2018, 4:14 PM  [facility] [address] [place], [place], [address] Phone: [phone]   Fax:  [phone]   Name: Brian [name] MRN: [id number] Date of Birth: 06/10/1940

## 2018-03-12 NOTE — Therapy (Signed)
[facility] [address] [place], [place], [address] Phone: [phone]   Fax:  [phone]  Occupational Therapy Treatment  Patient Details  Name: Brian [name] MRN: [id number] Date of Birth: [birth year] Referring Provider (OT): Dr. Rexene Alberts   Encounter Date: 03/12/2018  OT End of Session - 03/12/18 1607    Visit Number  9    Number of Visits  25    Date for OT Re-Evaluation  04/22/18    Authorization Type  Medicare    Authorization Time Period  cert 12 weeks, anticipate d/c following 8 weeks    Authorization - Visit Number  9    Authorization - Number of Visits  10    OT Start Time  1450    OT Stop Time  1530    OT Time Calculation (min)  40 min       Past Medical History:  Diagnosis Date  . Abnormal PFT 1. 05/18/08  2. 11/30/08   1. Showed mild airflow obstruction, mild restriction, mild diffusion defect; FEV1 2.22(64%), FVC 3.33(65%), FEVi% 67, TLC 5.19(69%), DLCO 77%, +BD  2. FEV1 2.38(73%), FVC 3.81(80%), FEV1% 63, TLC 5.61(80%), DLCO 79%, no BD  . Allergic rhinitis   . Anxiety   . BPH (benign prostatic hyperplasia)   . Cellulitis    right leg MRSA  . Erectile dysfunction   . GERD (gastroesophageal reflux disease)   . Heart murmur    mild MR by echo  . Hyperlipidemia   . Hypertension   . Osteoarthritis   . Paresthesias 06/22/2012  . Parkinsonism ([place]) 06/22/2012  . Permanent atrial fibrillation   . Restrictive lung disease     Past Surgical History:  Procedure Laterality Date  . CARDIAC CATHETERIZATION  11/2009   normal coronary arteries  . CARDIOVERSION     multiple  . CATARACT EXTRACTION    . COLONOSCOPY    . COLONOSCOPY WITH PROPOFOL N/A 09/02/2016   Procedure: COLONOSCOPY WITH PROPOFOL;  Surgeon: Garlan Fair, MD;  [facility];  Service: Endoscopy;  Laterality: N/A;  . EYE SURGERY     bilateral cataract extraction  . JOINT REPLACEMENT    . TOTAL KNEE ARTHROPLASTY Left 11/30/2014   Procedure: TOTAL KNEE ARTHROPLASTY;  Surgeon: Susa Day, MD;  [facility];  Service: Orthopedics;  Laterality: Left;  . TOTAL KNEE ARTHROPLASTY Right 07/12/2015   Procedure: RIGHT TOTAL KNEE ARTHROPLASTY;  Surgeon: Susa Day, MD;  [facility];  Service: Orthopedics;  Laterality: Right;  . TRICEPS TENDON REPAIR Right 05/20/2017  . TRICEPS TENDON REPAIR Right 05/20/2017   Procedure: TRICEPS TENDON REPAIR;  Surgeon: Iran Planas, MD;  Location: [facility];  Service: Orthopedics;  Laterality: Right;    There were no vitals filed for this visit.  Subjective Assessment - 03/12/18 1605    Subjective   Pt reports continued difficulty getting up off the sofa    Patient Stated Goals  to be as independent as he can, to be able dress himself    Currently in Pain?  No/denies             Treatment: Pt sit-stand mod A for forward weight shift then transferred stand pivot with u-step with min A.  supine PWR !up, rock and twist, mod v.c(pt's wife is able to cue him at home. PWR! Up in seated edge of mat x 10 reps, seated functional reaching with trunk rotation  right and left UE's to encourage upright posture, min-mod v.c Discussion with  pt regarding recommendation that he has supervision when his wife is at work for safety, also encouraged life line for safety.                OT Short Term Goals - 03/12/18 1607      OT SHORT TERM GOAL #1   Title  Pt/ caregeiver willl be I with PD specific HEP    Status  Achieved   Pt's wife verbalizes understanding     OT SHORT TERM GOAL #2   Title  Pt /wife will verbalize understanding of adapted strategies for ADLS/ IADLs to maximize safety and independence.    Status  On-going   needs reinforcement     OT SHORT TERM GOAL #3   Title  Pt will perfom LB dressing with min A    Status  On-going      OT SHORT TERM GOAL #4   Title  Pt will demonstrate improved fine motor coordination as evidenced by decreasing 9 hole pegs test by 4  secs bilaterally.    Status  On-going      OT SHORT TERM GOAL #5   Title  Pt/ wife will verbalize understanding of compensatory strategies for memory/ cognition    Status  On-going        OT Long Term Goals - 01/22/18 1246      OT LONG TERM GOAL #1   Title  Pt and wife will verbalize understanding of ways to prevent future complications and appropriate community resources    Time  12    Period  Weeks    Status  New    Target Date  04/22/18      OT LONG TERM GOAL #2   Title  Pt will demonstrate aiblity to retrieve a lightweight object at 115 shoulder flexion and -35 elbow extension with RUE.    Time  12    Period  Weeks    Status  New      OT LONG TERM GOAL #3   Title  Pt.will demonstrate ability to retrieve a lightweight obect at 120 shoulder flexion with LUE.    Time  12    Period  Weeks    Status  New      OT LONG TERM GOAL #4   Title  Pt will demonstrate improved gross motor coordination/ bilateral UE functional use for ADLs as evidenced by increasing box/ blocks score bilaterally by 3 blocks    Baseline  RUE 26 blocks, LUe 24 blocks     Time  12    Period  Weeks    Status  New      OT LONG TERM GOAL #5   Title  Pt will demonstrae ability to fasten 3 buttons in 75 secs or less    Time  12    Period  Weeks    Status  New            Plan - 03/12/18 1608    Clinical Impression Statement  Pt is progressing slowly towards goals. Pt remains limited by cognitive deficits.    Rehab Potential  Fair    Current Impairments/barriers affecting progress:  length of time since onset, severity of deficits, cognitive deficits    OT Frequency  2x / week    OT Duration  12 weeks    OT Treatment/Interventions  Self-care/ADL training;Therapeutic exercise;Gait Training;Aquatic Therapy;Moist Heat;Paraffin;Neuromuscular education;Balance training;Patient/family education;Therapeutic activities;Functional Mobility Training;Fluidtherapy;Cryotherapy;Ultrasound;Contrast Bath;DME  and/or AE instruction;Manual Therapy;Passive range of motion;Cognitive remediation/compensation  Plan  check STG's, ADL strategies, discuss with wife pt's need for supervision/ lifeline    Consulted and Agree with Plan of Care  Patient;Family member/caregiver    Family Member Consulted  wife       Patient will benefit from skilled therapeutic intervention in order to improve the following deficits and impairments:  Abnormal gait, Decreased cognition, Impaired flexibility, Pain, Decreased coordination, Decreased mobility, Decreased strength, Decreased range of motion, Decreased activity tolerance, Decreased balance, Decreased endurance, Decreased knowledge of precautions, Decreased safety awareness, Difficulty walking, Impaired perceived functional ability, Impaired UE functional use  Visit Diagnosis: Other symptoms and signs involving the nervous system  Other abnormalities of gait and mobility  Abnormal posture  Muscle weakness (generalized)  Other lack of coordination  Frontal lobe and executive function deficit    Problem List Patient Active Problem List   Diagnosis Date Noted  . Rupture of right triceps tendon 05/20/2017  . Triceps tendon rupture, right, initial encounter 05/20/2017  . Atrial fibrillation ([place]) [I48.91] 02/05/2017  . Urinary retention 12/31/2015  . Acute renal failure (ARF) ([place]) 12/31/2015  . Acute encephalopathy 12/31/2015  . Primary osteoarthritis of right knee 07/12/2015  . Primary osteoarthritis of left knee 11/30/2014  . Encounter for therapeutic drug monitoring 06/21/2013  . Edema of extremities 02/04/2013  . Chronic anticoagulation 02/04/2013  . GERD (gastroesophageal reflux disease)   . Restrictive lung disease   . CKD (chronic kidney disease) stage 2, GFR 60-89 ml/min   . Cellulitis   . Allergic rhinitis   . Anxiety   . Long term (current) use of anticoagulants 01/25/2013  . Parkinsonism ([place]) 06/22/2012  . Paresthesias 06/22/2012  .  PULMONARY NODULE 06/02/2008  . SNORING 06/02/2008  . Hyperlipidemia 05/15/2008  . Essential hypertension 05/15/2008  . PULMONARY FUNCTION TESTS, ABNORMAL 05/15/2008    Brian [name] 03/12/2018, 4:10 PM  [facility] [address] [place], [place], [address] Phone: [phone]   Fax:  [phone]  Name: Brian [name] MRN: [id number] Date of Birth: 24-Nov-1940

## 2018-03-13 NOTE — Therapy (Signed)
[facility] [address] [place], [place], [address] Phone: [phone]   Fax:  [phone]  Physical Therapy Treatment  Patient Details  Name: Brian [name] MRN: [id number] Date of Birth: 02-21-1941 Referring Provider (PT): Rexene Alberts   Encounter Date: 03/12/2018  PT End of Session - 03/12/18 2043    Visit Number  11    Number of Visits  18    Date for PT Re-Evaluation  04/21/18    Authorization Type  Medicare and [place] Time  7654    PT Stop Time  1615    PT Time Calculation (min)  40 min    Equipment Utilized During Treatment  Gait belt    Activity Tolerance  Patient tolerated treatment well    Behavior During Therapy  [facility] for tasks assessed/performed;Flat affect       Past Medical History:  Diagnosis Date  . Abnormal PFT 1. 05/18/08  2. 11/30/08   1. Showed mild airflow obstruction, mild restriction, mild diffusion defect; FEV1 2.22(64%), FVC 3.33(65%), FEVi% 67, TLC 5.19(69%), DLCO 77%, +BD  2. FEV1 2.38(73%), FVC 3.81(80%), FEV1% 63, TLC 5.61(80%), DLCO 79%, no BD  . Allergic rhinitis   . Anxiety   . BPH (benign prostatic hyperplasia)   . Cellulitis    right leg MRSA  . Erectile dysfunction   . GERD (gastroesophageal reflux disease)   . Heart murmur    mild MR by echo  . Hyperlipidemia   . Hypertension   . Osteoarthritis   . Paresthesias 06/22/2012  . Parkinsonism ([place]) 06/22/2012  . Permanent atrial fibrillation   . Restrictive lung disease     Past Surgical History:  Procedure Laterality Date  . CARDIAC CATHETERIZATION  11/2009   normal coronary arteries  . CARDIOVERSION     multiple  . CATARACT EXTRACTION    . COLONOSCOPY    . COLONOSCOPY WITH PROPOFOL N/A 09/02/2016   Procedure: COLONOSCOPY WITH PROPOFOL;  Surgeon: Garlan Fair, MD;  [facility]: [facility];  Service: Endoscopy;  Laterality: N/A;  . EYE SURGERY     bilateral cataract extraction  . JOINT REPLACEMENT    . TOTAL  KNEE ARTHROPLASTY Left 11/30/2014   Procedure: TOTAL KNEE ARTHROPLASTY;  Surgeon: Susa Day, MD;  Location: [facility];  Service: Orthopedics;  Laterality: Left;  . TOTAL KNEE ARTHROPLASTY Right 07/12/2015   Procedure: RIGHT TOTAL KNEE ARTHROPLASTY;  Surgeon: Susa Day, MD;  Location: [facility];  Service: Orthopedics;  Laterality: Right;  . TRICEPS TENDON REPAIR Right 05/20/2017  . TRICEPS TENDON REPAIR Right 05/20/2017   Procedure: TRICEPS TENDON REPAIR;  Surgeon: Iran Planas, MD;  Location: [facility];  Service: Orthopedics;  Laterality: Right;    There were no vitals filed for this visit.  Subjective Assessment - 03/12/18 1539    Subjective  Feeling a bit tired after 2 other therapies (SLP and OT).     Pertinent History  Fall February 2019 with tricep tendon tear, s/p 3 months [facility] and then [facility] therapies, L TKR 9/216, R TKR 06/2015    Patient Stated Goals  Pt's goal for PT is to drive car again, to help with balance.    Currently in Pain?  No/denies                       [facility] Adult PT Treatment/Exercise - 03/12/18 2033      Transfers   Transfers  Sit to Stand;Stand  to Sit    Sit to Stand  4: Min guard;Without upper extremity assist;From bed;From chair/3-in-1;4: Min assist    Sit to Stand Details  Verbal cues for sequencing;Verbal cues for technique    Sit to Stand Details (indicate cue type and reason)  max cues and multiple techniques used to scoot forward to edge of chair (pt did best with forward lean, push up with bil UEs to elevate hips slightly and "walk" his hips forward one at a time);     Number of Reps  Other reps (comment)   4 reps     Ambulation/Gait   Ambulation/Gait Assistance  4: Min guard;4: Min assist    Ambulation/Gait Assistance Details  vc for dealing with freezing (stop, relax, imagine stepping over a line); with cues to step over the lines between the tiles, pt used much larger steps during gait    Ambulation Distance (Feet)  150 Feet    45   Assistive device  Other (Comment)   U-step   Gait Pattern  Decreased step length - right;Decreased step length - left;Right flexed knee in stance;Shuffle;Festinating;Trunk flexed;Narrow base of support;Poor foot clearance - right;Decreased hip/knee flexion - right;Decreased hip/knee flexion - left;Right foot flat;Left foot flat    Ambulation Surface  Level;Indoor        PWR [facility]) - 03/12/18 2041    PWR! exercises  Moves in quadraped   modified quadruped   PWR! Up  10   at counter   PWR! Rock  10   at [employer] Twist  20   10 to rt, 10 to left   Comments  modified quadruped at counter with minguard assist            PT Short Term Goals - 03/01/18 1546      PT SHORT TERM GOAL #1   Title  Pt will perform HEP with wife's supervision for improved posture, transfers, balance, gait.  TARGET 02/26/18    Baseline  Pt able to demo HEP with cues from therapist; at previous visit, wife reports pt is overwhelmed with his exercises-pt reports doing HEP only when he is reminded    Time  5    Period  Weeks    Status  Partially Met      PT SHORT TERM GOAL #2   Title  Pt will perform at least 6 of 10 reps of sit<>stand with minimal UE support for improved transfer efficiency and safety.    Time  5    Period  Weeks    Status  Not Met      PT SHORT TERM GOAL #3   Title  Pt will improve TUG score to less than or equal to 70 seconds for decreased fall risk.    Baseline  61 sec at best with U-step walker    Time  5    Period  Weeks    Status  Achieved      PT SHORT TERM GOAL #4   Title  Pt/wife will demonstrate understanding of tips to reduce freezing with gait and turns.    Time  5    Period  Weeks    Status  Not Met        PT Long Term Goals - 01/22/18 1247      PT LONG TERM GOAL #1   Title  Pt/wife will verbalize understanding of fall prevention to decrease fall risk in home environment.  TARGET 03/26/2018    Time  9  Period  Weeks    Status  New    Target  Date  03/26/18      PT LONG TERM GOAL #2   Title  Pt will perform at least 10 reps of sit<>stand with minimal to no UE support, modified independently for improved transfer efficiency and safety.    Time  9    Period  Weeks    Status  New    Target Date  03/26/18      PT LONG TERM GOAL #3   Title  Pt will improve TUG score to less than or equal to 50 seconds for decreased fall risk.    Time  9    Period  Weeks    Status  New    Target Date  03/26/18      PT LONG TERM GOAL #4   Title  Pt will improve gait velocity to at least 1.8 ft/sec for decreased fall risk, improved gait efficiency and safety.    Time  9    Period  Weeks    Status  New    Target Date  03/26/18      PT LONG TERM GOAL #5   Title  Berg balance test to be assessed, with goal to be written as appropriate.    Time  9    Period  Weeks    Status  New    Target Date  03/26/18            Plan - 03/12/18 2044    Clinical Impression Statement  Skilled session focused on sit to stand transfers, gait training, and modifed quadruped PWR exercises (at counter). Patient's gait consistently improved with cues to "step over" lines in the tiles or imagined lines. He reports he does not routinely use his laser on his Ustep walker and encouraged him (and informed his wife when she picked up pt) to use this technique. Provided wife with the note constructed by Glendell Docker, SLP re: having wife or cousin attend at least one of pt's 2 sessions per week for education to improve carryover of education due to pt's cognitive deficits. Wife responded it has been a busy time of year and she will attempt to attend.     Rehab Potential  Good    Clinical Impairments Affecting Rehab Potential  supportive wife; cognition/severity of PD deficits    PT Frequency  2x / week    PT Duration  Other (comment)   9 weeks   PT Treatment/Interventions  ADLs/Self Care Home Management;DME Instruction;Gait training;Stair training;Functional mobility  training;Therapeutic activities;Therapeutic exercise;Balance training;Patient/family education;Neuromuscular re-education    PT Next Visit Plan  use w/c from lobby or SLP office to gym; use our U-step walker instead of his RW;  Transfer training, focusing on fwd transition, lessening use of UEs, and then terminal knee extension (see written instructions 12/12--too wordy and can be revised! Review with pt's wife to see  if she has questions); gait training for increased step length and posture (did well with stepping over imaginary line.    Consulted and Agree with Plan of Care  Patient    Family Member Consulted          Patient will benefit from skilled therapeutic intervention in order to improve the following deficits and impairments:  Abnormal gait, Decreased balance, Decreased coordination, Decreased mobility, Decreased safety awareness, Difficulty walking, Decreased strength, Impaired flexibility, Postural dysfunction  Visit Diagnosis: Other symptoms and signs involving the nervous system  Other abnormalities of gait  and mobility  Abnormal posture  Muscle weakness (generalized)     Problem List Patient Active Problem List   Diagnosis Date Noted  . Rupture of right triceps tendon 05/20/2017  . Triceps tendon rupture, right, initial encounter 05/20/2017  . Atrial fibrillation ([place]) [I48.91] 02/05/2017  . Urinary retention 12/31/2015  . Acute renal failure (ARF) ([place]) 12/31/2015  . Acute encephalopathy 12/31/2015  . Primary osteoarthritis of right knee 07/12/2015  . Primary osteoarthritis of left knee 11/30/2014  . Encounter for therapeutic drug monitoring 06/21/2013  . Edema of extremities 02/04/2013  . Chronic anticoagulation 02/04/2013  . GERD (gastroesophageal reflux disease)   . Restrictive lung disease   . CKD (chronic kidney disease) stage 2, GFR 60-89 ml/min   . Cellulitis   . Allergic rhinitis   . Anxiety   . Long term (current) use of anticoagulants 01/25/2013   . Parkinsonism ([place]) 06/22/2012  . Paresthesias 06/22/2012  . PULMONARY NODULE 06/02/2008  . SNORING 06/02/2008  . Hyperlipidemia 05/15/2008  . Essential hypertension 05/15/2008  . PULMONARY FUNCTION TESTS, ABNORMAL 05/15/2008    Rexanne Mano, PT 03/13/2018, 9:42 AM  [facility] [address] [place], [place], [address] Phone: [phone]   Fax:  [phone]  Name: SEITH AIKEY MRN: [id number] Date of Birth: [birth year]

## 2018-03-15 ENCOUNTER — Ambulatory Visit: Payer: Medicare Other | Admitting: Speech Pathology

## 2018-03-15 ENCOUNTER — Ambulatory Visit: Payer: Medicare Other | Admitting: Occupational Therapy

## 2018-03-15 ENCOUNTER — Ambulatory Visit: Payer: Medicare Other | Admitting: Physical Therapy

## 2018-03-15 ENCOUNTER — Encounter: Payer: Self-pay | Admitting: Physical Therapy

## 2018-03-15 ENCOUNTER — Encounter: Payer: Self-pay | Admitting: Occupational Therapy

## 2018-03-15 DIAGNOSIS — R2689 Other abnormalities of gait and mobility: Secondary | ICD-10-CM

## 2018-03-15 DIAGNOSIS — M6281 Muscle weakness (generalized): Secondary | ICD-10-CM

## 2018-03-15 DIAGNOSIS — R29818 Other symptoms and signs involving the nervous system: Secondary | ICD-10-CM | POA: Diagnosis not present

## 2018-03-15 DIAGNOSIS — R293 Abnormal posture: Secondary | ICD-10-CM

## 2018-03-15 DIAGNOSIS — R1310 Dysphagia, unspecified: Secondary | ICD-10-CM | POA: Diagnosis not present

## 2018-03-15 DIAGNOSIS — R471 Dysarthria and anarthria: Secondary | ICD-10-CM | POA: Diagnosis not present

## 2018-03-15 DIAGNOSIS — R2681 Unsteadiness on feet: Secondary | ICD-10-CM

## 2018-03-15 DIAGNOSIS — R41844 Frontal lobe and executive function deficit: Secondary | ICD-10-CM

## 2018-03-15 DIAGNOSIS — R278 Other lack of coordination: Secondary | ICD-10-CM

## 2018-03-15 NOTE — Therapy (Signed)
[facility] [address] [place], [place], [address] Phone: [phone]   Fax:  [phone]  Physical Therapy Treatment  Patient Details  Name: Brian [name] MRN: [id number] Date of Birth: 1940/08/16 Referring Provider (PT): Rexene Alberts   Encounter Date: 03/15/2018  PT End of Session - 03/15/18 1718    Visit Number  12    Number of Visits  18    Date for PT Re-Evaluation  04/21/18    Authorization Type  Medicare and [place] Time  1021    PT Stop Time  1101    PT Time Calculation (min)  40 min    Equipment Utilized During Treatment  Gait belt    Activity Tolerance  Patient tolerated treatment well    Behavior During Therapy  [facility] for tasks assessed/performed;Flat affect       Past Medical History:  Diagnosis Date  . Abnormal PFT 1. 05/18/08  2. 11/30/08   1. Showed mild airflow obstruction, mild restriction, mild diffusion defect; FEV1 2.22(64%), FVC 3.33(65%), FEVi% 67, TLC 5.19(69%), DLCO 77%, +BD  2. FEV1 2.38(73%), FVC 3.81(80%), FEV1% 63, TLC 5.61(80%), DLCO 79%, no BD  . Allergic rhinitis   . Anxiety   . BPH (benign prostatic hyperplasia)   . Cellulitis    right leg MRSA  . Erectile dysfunction   . GERD (gastroesophageal reflux disease)   . Heart murmur    mild MR by echo  . Hyperlipidemia   . Hypertension   . Osteoarthritis   . Paresthesias 06/22/2012  . Parkinsonism ([place]) 06/22/2012  . Permanent atrial fibrillation   . Restrictive lung disease     Past Surgical History:  Procedure Laterality Date  . CARDIAC CATHETERIZATION  11/2009   normal coronary arteries  . CARDIOVERSION     multiple  . CATARACT EXTRACTION    . COLONOSCOPY    . COLONOSCOPY WITH PROPOFOL N/A 09/02/2016   Procedure: COLONOSCOPY WITH PROPOFOL;  Surgeon: Garlan Fair, MD;  [facility];  Service: Endoscopy;  Laterality: N/A;  . EYE SURGERY     bilateral cataract extraction  . JOINT REPLACEMENT    . TOTAL  KNEE ARTHROPLASTY Left 11/30/2014   Procedure: TOTAL KNEE ARTHROPLASTY;  Surgeon: Susa Day, MD;  [facility];  Service: Orthopedics;  Laterality: Left;  . TOTAL KNEE ARTHROPLASTY Right 07/12/2015   Procedure: RIGHT TOTAL KNEE ARTHROPLASTY;  Surgeon: Susa Day, MD;  [facility];  Service: Orthopedics;  Laterality: Right;  . TRICEPS TENDON REPAIR Right 05/20/2017  . TRICEPS TENDON REPAIR Right 05/20/2017   Procedure: TRICEPS TENDON REPAIR;  Surgeon: Iran Planas, MD;  Location: [facility];  Service: Orthopedics;  Laterality: Right;    There were no vitals filed for this visit.  Subjective Assessment - 03/15/18 1024    Subjective  Been using the laser line with U-Step sometimes.  Per wife:  Using the lift chair again, because he can't stand up on his own.    Patient is accompained by:  Family member   wife   Pertinent History  Fall February 2019 with tricep tendon tear, s/p 3 months [facility] and then [facility] therapies, L TKR 9/216, R TKR 06/2015    Patient Stated Goals  Pt's goal for PT is to drive car again, to help with balance.                       [facility] Adult PT  Treatment/Exercise - 03/15/18 1026      Transfers   Transfers  Sit to Stand;Stand to Sit    Sit to Stand  4: Min guard;From bed;4: Min assist;With upper extremity assist    Sit to Stand Details  Verbal cues for sequencing;Verbal cues for technique    Sit to Stand Details (indicate cue type and reason)  Used cues given in 03/04/18 handout (pt says he appreciates these and responded well to cues provided.    Stand to Sit  4: Min guard    Comments  Pt demonstrates very good ability to follow cues consistently given from 03/04/18 handout.  Asked wife if she is provided these cues for transfers at home. She reports often being in another room and giving cues.  PT reiterated need for short, succint, consistent cues, and that pt likely needs at least supervision-min guard to make sure to prevent posterior  lean.  She reports he is using his lift chair at home again.      Ambulation/Gait   Ambulation/Gait Assistance  4: Min guard;4: Min assist    Ambulation/Gait Assistance Details  VCs to stop and reset to take larger steps (visualizing stepping over line, stepping "under the U-step RW seat")    Ambulation Distance (Feet)  80 Feet   then 20   Assistive device  Other (Comment)   U-step RW   Gait Pattern  Decreased step length - right;Decreased step length - left;Right flexed knee in stance;Shuffle;Festinating;Trunk flexed;Narrow base of support;Poor foot clearance - right;Decreased hip/knee flexion - right;Decreased hip/knee flexion - left;Right foot flat;Left foot flat    Ambulation Surface  Level;Indoor    Gait Comments  Cues for marching in place to aid with turning to prepare to sit, to help to lessen freezing episodes.  Again, discussed with wife the benefit of short, brief cues to help to offset freezing episodes      Self-Care   Self-Care  Other Self-Care Comments    Other Self-Care Comments   Fall prevention education      Neuro Re-ed    Neuro Re-ed Details   Seated forward lean, then push up to upright sitting (UES extended pushing from knees), x 10 reps, to assist with improved forward lean to initiate transfers; forward lean then to mat push-up position (keeping head anterior) with min assist, x 5 reps; to assist with increased forward lean for trasnfers.  Attempted seated PWR! Up position, x 8 reps, pushing up from knees (pt pushes too far posteriorly and needs therapy tactile cues to avoid posteiror lean)            Self Care:  Provided information on fall prevention and gait safety in home (wife reports pt ambulated to bathroom no device, sock-feet one night recently, but did not fall) PT Education - 03/15/18 1728    Education Details  Fall prevention, review of handout for sequence of sit<>stand; reminded wife of cueing technique (simple, repetitive cues)    Person(s)  Educated  Patient    Methods  Explanation;Demonstration;Handout    Comprehension  Verbalized understanding;Returned demonstration;Need further instruction   Unsure that wife is using these cues; when she is, she reports pt does it his own way      PT Short Term Goals - 03/01/18 1546      PT SHORT TERM GOAL #1   Title  Pt will perform HEP with wife's supervision for improved posture, transfers, balance, gait.  TARGET 02/26/18    Baseline  Pt able to  demo HEP with cues from therapist; at previous visit, wife reports pt is overwhelmed with his exercises-pt reports doing HEP only when he is reminded    Time  5    Period  Weeks    Status  Partially Met      PT SHORT TERM GOAL #2   Title  Pt will perform at least 6 of 10 reps of sit<>stand with minimal UE support for improved transfer efficiency and safety.    Time  5    Period  Weeks    Status  Not Met      PT SHORT TERM GOAL #3   Title  Pt will improve TUG score to less than or equal to 70 seconds for decreased fall risk.    Baseline  61 sec at best with U-step walker    Time  5    Period  Weeks    Status  Achieved      PT SHORT TERM GOAL #4   Title  Pt/wife will demonstrate understanding of tips to reduce freezing with gait and turns.    Time  5    Period  Weeks    Status  Not Met        PT Long Term Goals - 03/15/18 1731      PT LONG TERM GOAL #1   Title  Pt/wife will verbalize understanding of fall prevention to decrease fall risk in home environment.  TARGET 03/26/2018    Time  9    Period  Weeks    Status  New      PT LONG TERM GOAL #2   Title  Pt will perform at least 10 reps of sit<>stand with minimal to no UE support, modified independently for improved transfer efficiency and safety.    Time  9    Period  Weeks    Status  New      PT LONG TERM GOAL #3   Title  Pt will improve TUG score to less than or equal to 50 seconds for decreased fall risk.    Time  9    Period  Weeks    Status  New      PT LONG TERM  GOAL #4   Title  Pt will improve gait velocity to at least 1.8 ft/sec for decreased fall risk, improved gait efficiency and safety.    Time  9    Period  Weeks    Status  New      PT LONG TERM GOAL #5   Title  Berg score to improve to at least 30/56 for decreased fall risk.    Baseline  23/56 baseline measure of Berg    Time  9    Period  Weeks    Status  Revised            Plan - 03/15/18 1718    Clinical Impression Statement  Skilled session today focused on sit to stand transfers, short distance gait training and exercises in sitting to promote anterior/forward weightshift to aid with transfers.  Wife present during session and reports they have gone back to use of lift chair at home because patient cannot get up on his own.  Patient reports today that his main goal is to improve transfers.  Discussed/reviewed with pt and wife cueing techniques for sit to stand transfers and for gait to try to reduce freezing episodes; however, wife reports when she tries this at home, pt wants to do it a  different way (leaving the walker behind to sit in chair, for example).  Reiterated safety information for transfers and gait, and provided fall prevention education.  Will need to discuss with patient/wife POC in light that last week of current POC is next week.  Pt doing well with transfer training in therapy today, but it seems that pt is having more difficulty at home.    Rehab Potential  Good    Clinical Impairments Affecting Rehab Potential  supportive wife; cognition/severity of PD deficits    PT Frequency  2x / week    PT Duration  Other (comment)   9 weeks   PT Treatment/Interventions  ADLs/Self Care Home Management;DME Instruction;Gait training;Stair training;Functional mobility training;Therapeutic activities;Therapeutic exercise;Balance training;Patient/family education;Neuromuscular re-education    PT Next Visit Plan  use w/c from lobby or SLP office to gym; use our U-step walker instead  of his RW;  Transfer training, focusing on fwd transition, (?try to add to HEP for forward lean in sitting like modified PWR! Up?); check on written instructions for transfers given 03/04/18-pt seems to do well with them today; gait training for increased step length and posture (did well with stepping over imaginary line.   Next week is week 9 of 9 in [place], but pt scheduled further; we need to discuss with pt/wife POC   Consulted and Agree with Plan of Care  Patient;Family member/caregiver    Family Member Consulted  wife       Patient will benefit from skilled therapeutic intervention in order to improve the following deficits and impairments:  Abnormal gait, Decreased balance, Decreased coordination, Decreased mobility, Decreased safety awareness, Difficulty walking, Decreased strength, Impaired flexibility, Postural dysfunction  Visit Diagnosis: Abnormal posture  Muscle weakness (generalized)  Other abnormalities of gait and mobility     Problem List Patient Active Problem List   Diagnosis Date Noted  . Rupture of right triceps tendon 05/20/2017  . Triceps tendon rupture, right, initial encounter 05/20/2017  . Atrial fibrillation ([place]) [I48.91] 02/05/2017  . Urinary retention 12/31/2015  . Acute renal failure (ARF) ([place]) 12/31/2015  . Acute encephalopathy 12/31/2015  . Primary osteoarthritis of right knee 07/12/2015  . Primary osteoarthritis of left knee 11/30/2014  . Encounter for therapeutic drug monitoring 06/21/2013  . Edema of extremities 02/04/2013  . Chronic anticoagulation 02/04/2013  . GERD (gastroesophageal reflux disease)   . Restrictive lung disease   . CKD (chronic kidney disease) stage 2, GFR 60-89 ml/min   . Cellulitis   . Allergic rhinitis   . Anxiety   . Long term (current) use of anticoagulants 01/25/2013  . Parkinsonism ([place]) 06/22/2012  . Paresthesias 06/22/2012  . PULMONARY NODULE 06/02/2008  . SNORING 06/02/2008  . Hyperlipidemia 05/15/2008  .  Essential hypertension 05/15/2008  . PULMONARY FUNCTION TESTS, ABNORMAL 05/15/2008    MARRIOTT,AMY W. 03/15/2018, 5:32 PM  Frazier Butt., PT   [facility] [address] [place]  [place], [place], [address] Phone: [phone]   Fax:  [phone]  Name: MANNY VITOLO MRN: [id number] Date of Birth: 10/04/1940

## 2018-03-15 NOTE — Therapy (Signed)
[facility] [address] [place], [place], [address] Phone: [phone]   Fax:  [phone]  Occupational Therapy Treatment  Patient Details  Name: Brian [name] MRN: [id number] Date of Birth: 1941/03/16 Referring Provider (OT): Dr. Rexene Alberts   Encounter Date: 03/15/2018  OT End of Session - 03/15/18 1103    Visit Number  10    Number of Visits  25    Date for OT Re-Evaluation  04/22/18    Authorization Type  Medicare    Authorization Time Period  cert 12 weeks, anticipate d/c following 8 weeks    Authorization - Visit Number  10    Authorization - Number of Visits  10    OT Start Time  1104    OT Stop Time  1145    OT Time Calculation (min)  41 min    Activity Tolerance  Patient tolerated treatment well    Behavior During Therapy  [facility] for tasks assessed/performed;Flat affect       Past Medical History:  Diagnosis Date  . Abnormal PFT 1. 05/18/08  2. 11/30/08   1. Showed mild airflow obstruction, mild restriction, mild diffusion defect; FEV1 2.22(64%), FVC 3.33(65%), FEVi% 67, TLC 5.19(69%), DLCO 77%, +BD  2. FEV1 2.38(73%), FVC 3.81(80%), FEV1% 63, TLC 5.61(80%), DLCO 79%, no BD  . Allergic rhinitis   . Anxiety   . BPH (benign prostatic hyperplasia)   . Cellulitis    right leg MRSA  . Erectile dysfunction   . GERD (gastroesophageal reflux disease)   . Heart murmur    mild MR by echo  . Hyperlipidemia   . Hypertension   . Osteoarthritis   . Paresthesias 06/22/2012  . Parkinsonism ([place]) 06/22/2012  . Permanent atrial fibrillation   . Restrictive lung disease     Past Surgical History:  Procedure Laterality Date  . CARDIAC CATHETERIZATION  11/2009   normal coronary arteries  . CARDIOVERSION     multiple  . CATARACT EXTRACTION    . COLONOSCOPY    . COLONOSCOPY WITH PROPOFOL N/A 09/02/2016   Procedure: COLONOSCOPY WITH PROPOFOL;  Surgeon: Garlan Fair, MD;  Location: [facility];  Service: Endoscopy;   Laterality: N/A;  . EYE SURGERY     bilateral cataract extraction  . JOINT REPLACEMENT    . TOTAL KNEE ARTHROPLASTY Left 11/30/2014   Procedure: TOTAL KNEE ARTHROPLASTY;  Surgeon: Susa Day, MD;  Location: [facility];  Service: Orthopedics;  Laterality: Left;  . TOTAL KNEE ARTHROPLASTY Right 07/12/2015   Procedure: RIGHT TOTAL KNEE ARTHROPLASTY;  Surgeon: Susa Day, MD;  Location[facility];  Service: Orthopedics;  Laterality: Right;  . TRICEPS TENDON REPAIR Right 05/20/2017  . TRICEPS TENDON REPAIR Right 05/20/2017   Procedure: TRICEPS TENDON REPAIR;  Surgeon: Iran Planas, MD;  Location: [facility];  Service: Orthopedics;  Laterality: Right;    There were no vitals filed for this visit.  Subjective Assessment - 03/15/18 1103    Patient Stated Goals  to be as independent as he can, to be able dress himself    Currently in Pain?  No/denies        Stand>sit with min-mod A and cues for use of large amplitude movement strategies and for physical assist.  In supine, closed chain chest press and shoulder flex with ball with min cueing for stretch.  Supine>sit with min-mod cueing for use of large amplitude movement strategies.   In sitting, closed chain shoulder flex, chest press, and diagonals to each side  with trunk rotation with min cueing/facilitation.  Doffing shoes with incr time and use of step-stool.  Donning shoes with min A for R (pull out back) and incr time for L with use of long-handled shoe horn and step stool.  (and min cues).  Attempted use of reacher for donning theraband loop to simulate pants.  Pt able to don over L foot with mod cues/incr time.  Did not complete due to time constraints.  Recommended pt practice at home and continue to attempt lower body dressing and donning pants with reacher as this helps with posture and flexibility.  Pt reports that he will try to attempt at home.  Sit>stand at end of session with mod A/cues.       OT Short Term Goals - 03/12/18 1607       OT SHORT TERM GOAL #1   Title  Pt/ caregeiver willl be I with PD specific HEP    Status  Achieved   Pt's wife verbalizes understanding     OT SHORT TERM GOAL #2   Title  Pt /wife will verbalize understanding of adapted strategies for ADLS/ IADLs to maximize safety and independence.    Status  On-going   needs reinforcement     OT SHORT TERM GOAL #3   Title  Pt will perfom LB dressing with min A    Status  On-going      OT SHORT TERM GOAL #4   Title  Pt will demonstrate improved fine motor coordination as evidenced by decreasing 9 hole pegs test by 4 secs bilaterally.    Status  On-going      OT SHORT TERM GOAL #5   Title  Pt/ wife will verbalize understanding of compensatory strategies for memory/ cognition    Status  On-going        OT Long Term Goals - 01/22/18 1246      OT LONG TERM GOAL #1   Title  Pt and wife will verbalize understanding of ways to prevent future complications and appropriate community resources    Time  12    Period  Weeks    Status  New    Target Date  04/22/18      OT LONG TERM GOAL #2   Title  Pt will demonstrate aiblity to retrieve a lightweight object at 115 shoulder flexion and -35 elbow extension with RUE.    Time  12    Period  Weeks    Status  New      OT LONG TERM GOAL #3   Title  Pt.will demonstrate ability to retrieve a lightweight obect at 120 shoulder flexion with LUE.    Time  12    Period  Weeks    Status  New      OT LONG TERM GOAL #4   Title  Pt will demonstrate improved gross motor coordination/ bilateral UE functional use for ADLs as evidenced by increasing box/ blocks score bilaterally by 3 blocks    Baseline  RUE 26 blocks, LUe 24 blocks     Time  12    Period  Weeks    Status  New      OT LONG TERM GOAL #5   Title  Pt will demonstrae ability to fasten 3 buttons in 75 secs or less    Time  12    Period  Weeks    Status  New            Plan - 03/15/18  [place]    Current  Impairments/barriers affecting progress:  length of time since onset, severity of deficits, cognitive deficits    OT Frequency  2x / week    OT Duration  12 weeks    OT Treatment/Interventions  Self-care/ADL training;Therapeutic exercise;Gait Training;Aquatic Therapy;Moist Heat;Paraffin;Neuromuscular education;Balance training;Patient/family education;Therapeutic activities;Functional Mobility Training;Fluidtherapy;Cryotherapy;Ultrasound;Contrast Bath;DME and/or AE instruction;Manual Therapy;Passive range of motion;Cognitive remediation/compensation    Plan  check STG's, ADL strategies, discuss with wife pt's need for supervision/ lifeline    Consulted and Agree with Plan of Care  Patient;Family member/caregiver    Family Member Consulted  wife       Patient will benefit from skilled therapeutic intervention in order to improve the following deficits and impairments:  Abnormal gait, Decreased cognition, Impaired flexibility, Pain, Decreased coordination, Decreased mobility, Decreased strength, Decreased range of motion, Decreased activity tolerance, Decreased balance, Decreased endurance, Decreased knowledge of precautions, Decreased safety awareness, Difficulty walking, Impaired perceived functional ability, Impaired UE functional use  Visit Diagnosis: Other symptoms and signs involving the nervous system  Other abnormalities of gait and mobility  Abnormal posture  Muscle weakness (generalized)  Other lack of coordination  Frontal lobe and executive function deficit  Unsteadiness on feet    Problem List Patient Active Problem List   Diagnosis Date Noted  . Rupture of right triceps tendon 05/20/2017  . Triceps tendon rupture, right, initial encounter 05/20/2017  . Atrial fibrillation ([place]) [I48.91] 02/05/2017  . Urinary retention 12/31/2015  . Acute renal failure (ARF) ([place]) 12/31/2015  . Acute encephalopathy 12/31/2015  . Primary osteoarthritis of right knee 07/12/2015  .  Primary osteoarthritis of left knee 11/30/2014  . Encounter for therapeutic drug monitoring 06/21/2013  . Edema of extremities 02/04/2013  . Chronic anticoagulation 02/04/2013  . GERD (gastroesophageal reflux disease)   . Restrictive lung disease   . CKD (chronic kidney disease) stage 2, GFR 60-89 ml/min   . Cellulitis   . Allergic rhinitis   . Anxiety   . Long term (current) use of anticoagulants 01/25/2013  . Parkinsonism ([place]) 06/22/2012  . Paresthesias 06/22/2012  . PULMONARY NODULE 06/02/2008  . SNORING 06/02/2008  . Hyperlipidemia 05/15/2008  . Essential hypertension 05/15/2008  . PULMONARY FUNCTION TESTS, ABNORMAL 05/15/2008    [facility] 03/15/2018, 11:04 AM  [facility] [address] [place], [place], [address] Phone: [phone]   Fax:  [phone]  Name: JIMIE KUWAHARA MRN: [id number] Date of Birth: 1941/03/23   Vianne Bulls, OTR/L [facility] [address] [place], [place]  [address] [phone] phone [phone] 03/15/18 11:04 AM

## 2018-03-15 NOTE — Patient Instructions (Addendum)
Tips for Talking with People who have Aphasia  . Say one thing at a time . Don't  rush - slow down, be patient . Talk face to face . Reduce background noise . Relax - be natural . Use pen and paper . Write down key words . Draw diagrams or pictures . Don't pretend you understand . Ask what helps . Recap - check you both understand . Be a partner, not a therapist   Aphasia does not affect intelligence, only language. The person with aphasia can still: make decisions, have opinions, and socialize.   Describing words (your wife or communication partner can help you by asking you some of these questions)  What group does it belong to?  What do you use it for?  Where can I find it?  What does it LOOK like?  What other words go with it?  What is the 1st sound of the word?  Many Ways to Communicate  Describe it Write it Draw it Gesture it Use related words                Dear Almyra Brace father  we thank you again  for the opportunity  to celebrate your holy name you are lovely and beautiful  and we want to give you thanks Please bless [place]  in this opportunity to serve you  and serve others and to bring you glory. in [place]' name.  Amen

## 2018-03-15 NOTE — Telephone Encounter (Signed)
Pt is up to date and his appts and is due for a refill on his klonopin. [facility] registry checked. Will send to Dr. Krista Blue, work-in MD on Dr. Guadelupe Sabin behalf.

## 2018-03-15 NOTE — Patient Instructions (Signed)
Provided handout for fall prevention

## 2018-03-15 NOTE — Therapy (Signed)
[facility] [address] [place], [place], [address] Phone: [phone]   Fax:  [phone]  Speech Language Pathology Treatment  Patient Details  Name: Brian [name] MRN: [id number] Date of Birth: [birth year] Referring Provider (SLP): Star Age, MD   Encounter Date: 03/15/2018  End of Session - 03/15/18 1134    Visit Number  11    Number of Visits  17    Date for SLP Re-Evaluation  04/21/18    SLP Start Time  0935   pt in restroom   SLP Stop Time   1015    SLP Time Calculation (min)  40 min    Activity Tolerance  Patient tolerated treatment well       Past Medical History:  Diagnosis Date  . Abnormal PFT 1. 05/18/08  2. 11/30/08   1. Showed mild airflow obstruction, mild restriction, mild diffusion defect; FEV1 2.22(64%), FVC 3.33(65%), FEVi% 67, TLC 5.19(69%), DLCO 77%, +BD  2. FEV1 2.38(73%), FVC 3.81(80%), FEV1% 63, TLC 5.61(80%), DLCO 79%, no BD  . Allergic rhinitis   . Anxiety   . BPH (benign prostatic hyperplasia)   . Cellulitis    right leg MRSA  . Erectile dysfunction   . GERD (gastroesophageal reflux disease)   . Heart murmur    mild MR by echo  . Hyperlipidemia   . Hypertension   . Osteoarthritis   . Paresthesias 06/22/2012  . Parkinsonism ([place]) 06/22/2012  . Permanent atrial fibrillation   . Restrictive lung disease     Past Surgical History:  Procedure Laterality Date  . CARDIAC CATHETERIZATION  11/2009   normal coronary arteries  . CARDIOVERSION     multiple  . CATARACT EXTRACTION    . COLONOSCOPY    . COLONOSCOPY WITH PROPOFOL N/A 09/02/2016   Procedure: COLONOSCOPY WITH PROPOFOL;  Surgeon: Garlan Fair, MD;  [facility];  Service: Endoscopy;  Laterality: N/A;  . EYE SURGERY     bilateral cataract extraction  . JOINT REPLACEMENT    . TOTAL KNEE ARTHROPLASTY Left 11/30/2014   Procedure: TOTAL KNEE ARTHROPLASTY;  Surgeon: Susa Day, MD;  [facility];  Service:  Orthopedics;  Laterality: Left;  . TOTAL KNEE ARTHROPLASTY Right 07/12/2015   Procedure: RIGHT TOTAL KNEE ARTHROPLASTY;  Surgeon: Susa Day, MD;  [facility];  Service: Orthopedics;  Laterality: Right;  . TRICEPS TENDON REPAIR Right 05/20/2017  . TRICEPS TENDON REPAIR Right 05/20/2017   Procedure: TRICEPS TENDON REPAIR;  Surgeon: Iran Planas, MD;  Location: [facility];  Service: Orthopedics;  Laterality: Right;    There were no vitals filed for this visit.         ADULT SLP TREATMENT - 03/15/18 1018      General Information   Behavior/Cognition  --      Treatment Provided   Treatment provided  --      Pain Assessment   Pain Assessment  --      Cognitive-Linquistic Treatment   Treatment focused on  --    Skilled Treatment  --      Assessment / Recommendations / Plan   Plan  --      Progression Toward Goals   Progression toward goals  --       SLP Education - 03/15/18 1134    Education Details  strategies for cuing pt at home    Person(s) Educated  Patient;Spouse    Methods  Explanation;Demonstration;Handout    Comprehension  Verbalized understanding;Need further instruction  SLP Short Term Goals - 03/15/18 0950      SLP SHORT TERM GOAL #1   Title  Pt will generate loud /a/ with average of upper 80s dB over 3 sessions    Status  Achieved      SLP SHORT TERM GOAL #2   Title  Pt will use abdominal breathing at rest 85% success over 2 sessions    Status  Not Met      SLP SHORT TERM GOAL #3   Title  Pt will use speech volume average low 70sdB when responding with sentences 18/20 over two sessions    Status  Not Met      SLP SHORT TERM GOAL #4   Title  pt will tell SLP 3 anomia strategies over three sessions    Status  Not Met       SLP Long Term Goals - 03/15/18 0950      SLP LONG TERM GOAL #1   Title  pt will generate loud /a/ average low 90s dB over 6 sessions    Baseline  02-11-18    Time  3   or 17 visits, for all LTGs   Period  Weeks     Status  On-going      SLP LONG TERM GOAL #2   Title  Pt will use abdominal breathing in sentences or short conversation 80% success over 2 sessions    Time  3    Period  Weeks    Status  On-going      SLP LONG TERM GOAL #3   Title  pt will use anomia compensations in 75% of opportunities in sentence responses, with min verbal cues to use them, over 3 sessions    Time  3    Period  Weeks    Status  On-going      SLP LONG TERM GOAL #4   Title  pt will undergo objective swallow eval PRN    Status  Deferred       Plan - 03/15/18 1134    Clinical Impression Statement  Pt cont to present with dysarthria including reduced speech volume; pt responds with louder volume when cued "think shout" today. Pt cont to exhibit slower processing today; SLP wonders if we are nearing the limit to what pt can do in therapy sessions with his loudness. Wife present today and receptive to instructions for cuing pt at home for anomia and louder speech. No overt s/s aspiration PNA today. Pt may require a modified barium swallow or FEES at some point during this therapy course. He would benefit from skilled ST addressing these skills to communicate more efficiently.     Speech Therapy Frequency  2x / week    Duration  --   8 weeks or 17 visits   Treatment/Interventions  Aspiration precaution training;Pharyngeal strengthening exercises;Diet toleration management by SLP;Language facilitation;Compensatory techniques;Internal/external aids;SLP instruction and feedback;Multimodal communcation approach;Functional tasks;Patient/family education;Compensatory strategies;Trials of upgraded texture/liquids;Cueing hierarchy;Environmental controls;Cognitive reorganization    Potential to Achieve Goals  Good    Consulted and Agree with Plan of Care  Patient;Family member/caregiver    Family Member Consulted  wife       Patient will benefit from skilled therapeutic intervention in order to improve the following deficits and  impairments:   Dysarthria and anarthria  Frontal lobe and executive function deficit    Problem List Patient Active Problem List   Diagnosis Date Noted  . Rupture of right triceps tendon 05/20/2017  . Triceps  tendon rupture, right, initial encounter 05/20/2017  . Atrial fibrillation ([place]) [I48.91] 02/05/2017  . Urinary retention 12/31/2015  . Acute renal failure (ARF) ([place]) 12/31/2015  . Acute encephalopathy 12/31/2015  . Primary osteoarthritis of right knee 07/12/2015  . Primary osteoarthritis of left knee 11/30/2014  . Encounter for therapeutic drug monitoring 06/21/2013  . Edema of extremities 02/04/2013  . Chronic anticoagulation 02/04/2013  . GERD (gastroesophageal reflux disease)   . Restrictive lung disease   . CKD (chronic kidney disease) stage 2, GFR 60-89 ml/min   . Cellulitis   . Allergic rhinitis   . Anxiety   . Long term (current) use of anticoagulants 01/25/2013  . Parkinsonism ([place]) 06/22/2012  . Paresthesias 06/22/2012  . PULMONARY NODULE 06/02/2008  . SNORING 06/02/2008  . Hyperlipidemia 05/15/2008  . Essential hypertension 05/15/2008  . PULMONARY FUNCTION TESTS, ABNORMAL 05/15/2008   Deneise Lever, [place], [place] Speech-Language Pathologist  Aliene Altes 03/15/2018, 11:37 AM  [facility] [address] [place], [place], [address] Phone: [phone]   Fax:  [phone]   Name: TOUSSAINT GOLSON MRN: [id number] Date of Birth: [birth year]

## 2018-03-19 ENCOUNTER — Encounter: Payer: Self-pay | Admitting: Occupational Therapy

## 2018-03-19 ENCOUNTER — Ambulatory Visit: Payer: Medicare Other

## 2018-03-19 ENCOUNTER — Ambulatory Visit: Payer: Medicare Other | Admitting: Occupational Therapy

## 2018-03-19 ENCOUNTER — Encounter: Payer: Self-pay | Admitting: Physical Therapy

## 2018-03-19 ENCOUNTER — Ambulatory Visit: Payer: Medicare Other | Admitting: Physical Therapy

## 2018-03-19 DIAGNOSIS — R2689 Other abnormalities of gait and mobility: Secondary | ICD-10-CM

## 2018-03-19 DIAGNOSIS — R293 Abnormal posture: Secondary | ICD-10-CM | POA: Diagnosis not present

## 2018-03-19 DIAGNOSIS — R1310 Dysphagia, unspecified: Secondary | ICD-10-CM

## 2018-03-19 DIAGNOSIS — R471 Dysarthria and anarthria: Secondary | ICD-10-CM | POA: Diagnosis not present

## 2018-03-19 DIAGNOSIS — R29818 Other symptoms and signs involving the nervous system: Secondary | ICD-10-CM

## 2018-03-19 DIAGNOSIS — R41844 Frontal lobe and executive function deficit: Secondary | ICD-10-CM

## 2018-03-19 DIAGNOSIS — R2681 Unsteadiness on feet: Secondary | ICD-10-CM

## 2018-03-19 DIAGNOSIS — M6281 Muscle weakness (generalized): Secondary | ICD-10-CM

## 2018-03-19 DIAGNOSIS — R278 Other lack of coordination: Secondary | ICD-10-CM

## 2018-03-19 NOTE — Patient Instructions (Signed)
(  Exercise) Monday Tuesday Wednesday Thursday Friday Saturday Sunday                                                                                                             

## 2018-03-19 NOTE — Therapy (Signed)
[facility] [address] [place], [place], [address] Phone: [phone]   Fax:  [phone]  Speech Language Pathology Treatment  Patient Details  Name: Brian [name] MRN: [id number] Date of Birth: [birth year] Referring Provider (SLP): Star Age, MD   Encounter Date: 03/19/2018  End of Session - 03/19/18 1322    Visit Number  12    Number of Visits  17    Date for SLP Re-Evaluation  04/21/18    SLP Start Time  1148    SLP Stop Time   1230    SLP Time Calculation (min)  42 min    Activity Tolerance  Patient tolerated treatment well       Past Medical History:  Diagnosis Date  . Abnormal PFT 1. 05/18/08  2. 11/30/08   1. Showed mild airflow obstruction, mild restriction, mild diffusion defect; FEV1 2.22(64%), FVC 3.33(65%), FEVi% 67, TLC 5.19(69%), DLCO 77%, +BD  2. FEV1 2.38(73%), FVC 3.81(80%), FEV1% 63, TLC 5.61(80%), DLCO 79%, no BD  . Allergic rhinitis   . Anxiety   . BPH (benign prostatic hyperplasia)   . Cellulitis    right leg MRSA  . Erectile dysfunction   . GERD (gastroesophageal reflux disease)   . Heart murmur    mild MR by echo  . Hyperlipidemia   . Hypertension   . Osteoarthritis   . Paresthesias 06/22/2012  . Parkinsonism ([place]) 06/22/2012  . Permanent atrial fibrillation   . Restrictive lung disease     Past Surgical History:  Procedure Laterality Date  . CARDIAC CATHETERIZATION  11/2009   normal coronary arteries  . CARDIOVERSION     multiple  . CATARACT EXTRACTION    . COLONOSCOPY    . COLONOSCOPY WITH PROPOFOL N/A 09/02/2016   Procedure: COLONOSCOPY WITH PROPOFOL;  Surgeon: Garlan Fair, MD;  Location: [facility];  Service: Endoscopy;  Laterality: N/A;  . EYE SURGERY     bilateral cataract extraction  . JOINT REPLACEMENT    . TOTAL KNEE ARTHROPLASTY Left 11/30/2014   Procedure: TOTAL KNEE ARTHROPLASTY;  Surgeon: Susa Day, MD;  Location: [facility];  Service: Orthopedics;   Laterality: Left;  . TOTAL KNEE ARTHROPLASTY Right 07/12/2015   Procedure: RIGHT TOTAL KNEE ARTHROPLASTY;  Surgeon: Susa Day, MD;  Location: [facility];  Service: Orthopedics;  Laterality: Right;  . TRICEPS TENDON REPAIR Right 05/20/2017  . TRICEPS TENDON REPAIR Right 05/20/2017   Procedure: TRICEPS TENDON REPAIR;  Surgeon: Iran Planas, MD;  Location: [facility];  Service: Orthopedics;  Laterality: Right;    There were no vitals filed for this visit.  Subjective Assessment - 03/19/18 1153    Subjective  Wife attends with pt today.    Patient is accompained by:  Family member   wife   Currently in Pain?  No/denies            ADULT SLP TREATMENT - 03/19/18 1156      General Information   Behavior/Cognition  Alert;Cooperative;Pleasant mood;Requires cueing      Treatment Provided   Treatment provided  Cognitive-Linquistic      Cognitive-Linquistic Treatment   Treatment focused on  Aphasia;Dysarthria;Patient/family/caregiver education    Skilled Treatment  Pt and wife arrived today with specific difficulty with recall of pt to take evening meds. SLP problem solved with pt how to make evening meds more consistent. SLP also assisted pt and wife with other areas of complex communication situations; SLP reiterated to pt's wife she will need to assist  pt at times with routines and with word finding and sentence construction from this point onward, as disease process continues.  Loud /a/ today completed with average mid to upper 80s dB with initial min-mod cues faded to min A.      Assessment / Recommendations / Plan   Plan  Continue with current plan of care      Progression Toward Goals   Progression toward goals  Progressing toward goals       SLP Education - 03/19/18 1322    Education Details  education re: wife assistance for pt with speech and language    Person(s) Educated  Patient;Spouse    Methods  Explanation;Demonstration    Comprehension  Verbalized understanding;Need  further instruction       SLP Short Term Goals - 03/15/18 0950      SLP SHORT TERM GOAL #1   Title  Pt will generate loud /a/ with average of upper 80s dB over 3 sessions    Status  Achieved      SLP SHORT TERM GOAL #2   Title  Pt will use abdominal breathing at rest 85% success over 2 sessions    Status  Not Met      SLP SHORT TERM GOAL #3   Title  Pt will use speech volume average low 70sdB when responding with sentences 18/20 over two sessions    Status  Not Met      SLP SHORT TERM GOAL #4   Title  pt will tell SLP 3 anomia strategies over three sessions    Status  Not Met       SLP Long Term Goals - 03/19/18 1324      SLP LONG TERM GOAL #1   Title  pt will generate loud /a/ average low 90s dB over 6 sessions    Baseline  02-11-18    Time  2   or 17 visits, for all LTGs   Period  Weeks    Status  On-going      SLP LONG TERM GOAL #2   Title  Pt will use abdominal breathing in sentences or short conversation 80% success over 2 sessions    Time  2    Period  Weeks    Status  On-going      SLP LONG TERM GOAL #3   Title  pt will use anomia compensations in 75% of opportunities in sentence responses, with min verbal cues to use them, over 3 sessions    Time  2    Period  Weeks    Status  On-going      SLP LONG TERM GOAL #4   Title  pt will undergo objective swallow eval PRN    Status  Deferred       Plan - 03/19/18 1323    Clinical Impression Statement  Pt cont to present with dysarthria including reduced speech volume; day; Wife present today and receptive to cont'd  instructions for cuing pt at home for anomia and louder speech. Routine for bedtime was reviewed and assistance provided. No overt s/s aspiration PNA today. Pt may require a modified barium swallow or FEES at some point during this therapy course. He would benefit from skilled ST addressing these skills to communicate more efficiently.     Speech Therapy Frequency  2x / week    Duration  --   8 weeks  or 17 visits   Treatment/Interventions  Aspiration precaution training;Pharyngeal strengthening exercises;Diet toleration management by  SLP;Language facilitation;Compensatory techniques;Internal/external aids;SLP instruction and feedback;Multimodal communcation approach;Functional tasks;Patient/family education;Compensatory strategies;Trials of upgraded texture/liquids;Cueing hierarchy;Environmental controls;Cognitive reorganization    Potential to Achieve Goals  Good    Consulted and Agree with Plan of Care  Patient;Family member/caregiver    Family Member Consulted  wife       Patient will benefit from skilled therapeutic intervention in order to improve the following deficits and impairments:   Dysarthria and anarthria  Dysphagia, unspecified type    Problem List Patient Active Problem List   Diagnosis Date Noted  . Rupture of right triceps tendon 05/20/2017  . Triceps tendon rupture, right, initial encounter 05/20/2017  . Atrial fibrillation ([place]) [I48.91] 02/05/2017  . Urinary retention 12/31/2015  . Acute renal failure (ARF) ([place]) 12/31/2015  . Acute encephalopathy 12/31/2015  . Primary osteoarthritis of right knee 07/12/2015  . Primary osteoarthritis of left knee 11/30/2014  . Encounter for therapeutic drug monitoring 06/21/2013  . Edema of extremities 02/04/2013  . Chronic anticoagulation 02/04/2013  . GERD (gastroesophageal reflux disease)   . Restrictive lung disease   . CKD (chronic kidney disease) stage 2, GFR 60-89 ml/min   . Cellulitis   . Allergic rhinitis   . Anxiety   . Long term (current) use of anticoagulants 01/25/2013  . Parkinsonism ([place]) 06/22/2012  . Paresthesias 06/22/2012  . PULMONARY NODULE 06/02/2008  . SNORING 06/02/2008  . Hyperlipidemia 05/15/2008  . Essential hypertension 05/15/2008  . PULMONARY FUNCTION TESTS, ABNORMAL 05/15/2008    [facility] ,MS, CCC-SLP  03/19/2018, 1:24 PM  [facility] [address] [place], [place], [address] Phone: [phone]   Fax:  [phone]   Name: DIVONTE SENGER MRN: [id number] Date of Birth: 08-Jan-1941

## 2018-03-19 NOTE — Therapy (Signed)
[facility] [address] [place], [place], [address] Phone: [phone]   Fax:  [phone]  Occupational Therapy Treatment  Patient Details  Name: Brian [name] MRN: [id number] Date of Birth: 1941-01-07 Referring Provider (OT): Dr. Rexene Alberts   Encounter Date: 03/19/2018  OT End of Session - 03/19/18 1018    Visit Number  11    Number of Visits  25    Date for OT Re-Evaluation  04/22/18    Authorization Type  Medicare    Authorization Time Period  cert 12 weeks, anticipate d/c following 8 weeks    Authorization - Visit Number  11    Authorization - Number of Visits  20    OT Start Time  1020    OT Stop Time  1100    OT Time Calculation (min)  40 min    Activity Tolerance  Patient tolerated treatment well    Behavior During Therapy  [facility] for tasks assessed/performed;Flat affect       Past Medical History:  Diagnosis Date  . Abnormal PFT 1. 05/18/08  2. 11/30/08   1. Showed mild airflow obstruction, mild restriction, mild diffusion defect; FEV1 2.22(64%), FVC 3.33(65%), FEVi% 67, TLC 5.19(69%), DLCO 77%, +BD  2. FEV1 2.38(73%), FVC 3.81(80%), FEV1% 63, TLC 5.61(80%), DLCO 79%, no BD  . Allergic rhinitis   . Anxiety   . BPH (benign prostatic hyperplasia)   . Cellulitis    right leg MRSA  . Erectile dysfunction   . GERD (gastroesophageal reflux disease)   . Heart murmur    mild MR by echo  . Hyperlipidemia   . Hypertension   . Osteoarthritis   . Paresthesias 06/22/2012  . Parkinsonism ([place]) 06/22/2012  . Permanent atrial fibrillation   . Restrictive lung disease     Past Surgical History:  Procedure Laterality Date  . CARDIAC CATHETERIZATION  11/2009   normal coronary arteries  . CARDIOVERSION     multiple  . CATARACT EXTRACTION    . COLONOSCOPY    . COLONOSCOPY WITH PROPOFOL N/A 09/02/2016   Procedure: COLONOSCOPY WITH PROPOFOL;  Surgeon: Garlan Fair, MD;  [facility];  Service: Endoscopy;   Laterality: N/A;  . EYE SURGERY     bilateral cataract extraction  . JOINT REPLACEMENT    . TOTAL KNEE ARTHROPLASTY Left 11/30/2014   Procedure: TOTAL KNEE ARTHROPLASTY;  Surgeon: Susa Day, MD;  Location: [facility];  Service: Orthopedics;  Laterality: Left;  . TOTAL KNEE ARTHROPLASTY Right 07/12/2015   Procedure: RIGHT TOTAL KNEE ARTHROPLASTY;  Surgeon: Susa Day, MD;  [facility];  Service: Orthopedics;  Laterality: Right;  . TRICEPS TENDON REPAIR Right 05/20/2017  . TRICEPS TENDON REPAIR Right 05/20/2017   Procedure: TRICEPS TENDON REPAIR;  Surgeon: Iran Planas, MD;  Location: [facility];  Service: Orthopedics;  Laterality: Right;    There were no vitals filed for this visit.  Subjective Assessment - 03/19/18 1018    Subjective   Wife reports that pt is only left for a couple hours at most in morning when it is his "good time where he moves well"    Patient is accompained by:  Family member    Patient Stated Goals  to be as independent as he can, to be able dress himself    Currently in Pain?  No/denies        Checked bilateral shoulder ROM, 9-hole peg test, and box and blocks test and discussed progress.--see below.  Sitting, bilateral shoulder  flex, chest press with scapular retraction, and abduction with trunk rotation x10 each with min facilitation, mod cueing for large amplitude movements.  Recommended that wife/family cue pt for large amplitude movements to incr carryover/improved performance of HEP and to give pt incr time to respond to cues (wife reports that pt currently performs independently).   Recommended pt not stay alone due to fall risk and cognitive deficits and that this may fluctuate from day to day (however, wife reports that he has to be alone for a couple hours while she is at work, but that usually another family member is at home for most of the day).  Recommended Life alert-type service and wife reports that she is in process of obtaining.  Wife reports that  she calls pt if he needs to take medication while alone (1x) and that someone is at home for other times.  Recommended that pt have everything in reach while alone as pt is at incr risk of falls due to freezing. Pt/wife verbalized understanding.  Also educated pt/wife in benefits of safe cardiovascular exercise and recommended tabletop UBE for UEs and/or LEs or go to the [facility] for stationary bike/seated stepper/UBE (they are members and pt enjoyed going regularly in the past).  Pt/wife verbalized understanding.     OT Education - 03/19/18 1208    Education Details  Memory Compensation Strategies and Ways to Keep Thinking Skills [employer]) Educated  Patient;Spouse    Methods  Explanation;Handout    Comprehension  Verbalized understanding       OT Short Term Goals - 03/19/18 1030      OT SHORT TERM GOAL #1   Title  Pt/ caregeiver willl be I with PD specific HEP    Status  Achieved   Pt's wife verbalizes understanding     OT SHORT TERM GOAL #2   Title  Pt /wife will verbalize understanding of adapted strategies for ADLS/ IADLs to maximize safety and independence.    Status  On-going   needs reinforcement     OT SHORT TERM GOAL #3   Title  Pt will perfom LB dressing with min A    Status  On-going   03/15/18:  per pt, wife still providing max A at home, but pt mod A/cues and incr time for shoes/socks in clinic     OT [place] #4   Title  Pt will demonstrate improved fine motor coordination as evidenced by decreasing 9 hole pegs test by 4 secs bilaterally.    Baseline  RUE 68.66 secs, LUE 68.82 secs    Status  Achieved   03/19/18:  R-57.53sec, L-59.75sec     OT SHORT TERM GOAL #5   Title  Pt/ wife will verbalize understanding of compensatory strategies for memory/ cognition    Status  Achieved   03/19/18:  education provided       OT Long Term Goals - 03/19/18 1034      OT LONG TERM GOAL #1   Title  Pt and wife will verbalize understanding of ways to prevent  future complications and appropriate community resources    Time  12    Period  Weeks    Status  On-going      OT LONG TERM GOAL #2   Title  Pt will demonstrate aiblity to retrieve a lightweight object at 115 shoulder flexion and -35 elbow extension with RUE.    Time  12    Period  Weeks  Status  Achieved   03/19/18:  120* with -25* elbow ext     OT LONG TERM GOAL #3   Title  Pt.will demonstrate ability to retrieve a lightweight obect at 120 shoulder flexion with LUE.    Time  12    Period  Weeks    Status  Achieved   03/19/18:  130*     OT LONG TERM GOAL #4   Title  Pt will demonstrate improved gross motor coordination/ bilateral UE functional use for ADLs as evidenced by increasing box/ blocks score bilaterally by 3 blocks    Baseline  RUE 26 blocks, LUe 24 blocks     Time  12    Period  Weeks    Status  On-going   03/19/18:  R-24blocks, L-19blocks     OT LONG TERM GOAL #5   Title  Pt will demonstrae ability to fasten 3 buttons in 75 secs or less    Time  12    Period  Weeks    Status  New            Plan - 03/19/18 1210    Clinical Impression Statement  Pt has met STG #4-5 and LTG#2-3.  Pt is making slow progress, but is limited by cognitive deficits.  Pt demo improved shoulder ROM and coordination.    Rehab Potential  Fair    Current Impairments/barriers affecting progress:  length of time since onset, severity of deficits, cognitive deficits    OT Frequency  2x / week    OT Duration  12 weeks    OT Treatment/Interventions  Self-care/ADL training;Therapeutic exercise;Gait Training;Aquatic Therapy;Moist Heat;Paraffin;Neuromuscular education;Balance training;Patient/family education;Therapeutic activities;Functional Mobility Training;Fluidtherapy;Cryotherapy;Ultrasound;Contrast Bath;DME and/or AE instruction;Manual Therapy;Passive range of motion;Cognitive remediation/compensation    Plan  check 9-hole peg test, ADL strategies, discuss with wife pt's need for  supervision/ lifeline    Consulted and Agree with Plan of Care  Patient;Family member/caregiver    Family Member Consulted  wife       Patient will benefit from skilled therapeutic intervention in order to improve the following deficits and impairments:  Abnormal gait, Decreased cognition, Impaired flexibility, Pain, Decreased coordination, Decreased mobility, Decreased strength, Decreased range of motion, Decreased activity tolerance, Decreased balance, Decreased endurance, Decreased knowledge of precautions, Decreased safety awareness, Difficulty walking, Impaired perceived functional ability, Impaired UE functional use  Visit Diagnosis: Other symptoms and signs involving the nervous system  Other lack of coordination  Frontal lobe and executive function deficit  Unsteadiness on feet  Muscle weakness (generalized)  Other abnormalities of gait and mobility  Abnormal posture    Problem List Patient Active Problem List   Diagnosis Date Noted  . Rupture of right triceps tendon 05/20/2017  . Triceps tendon rupture, right, initial encounter 05/20/2017  . Atrial fibrillation ([place]) [I48.91] 02/05/2017  . Urinary retention 12/31/2015  . Acute renal failure (ARF) ([place]) 12/31/2015  . Acute encephalopathy 12/31/2015  . Primary osteoarthritis of right knee 07/12/2015  . Primary osteoarthritis of left knee 11/30/2014  . Encounter for therapeutic drug monitoring 06/21/2013  . Edema of extremities 02/04/2013  . Chronic anticoagulation 02/04/2013  . GERD (gastroesophageal reflux disease)   . Restrictive lung disease   . CKD (chronic kidney disease) stage 2, GFR 60-89 ml/min   . Cellulitis   . Allergic rhinitis   . Anxiety   . Long term (current) use of anticoagulants 01/25/2013  . Parkinsonism ([place]) 06/22/2012  . Paresthesias 06/22/2012  . PULMONARY NODULE 06/02/2008  . SNORING 06/02/2008  .  Hyperlipidemia 05/15/2008  . Essential hypertension 05/15/2008  . PULMONARY FUNCTION  TESTS, ABNORMAL 05/15/2008    [facility] 03/19/2018, 12:21 PM  [facility] [address] [employer] [place], [place], [address] Phone: [phone]   Fax:  [phone]  Name: Brian [name] MRN: [id number] Date of Birth: 10/15/1940   Vianne Bulls, OTR/L [facility] [address] [place],   [address] [phone] phone [phone] 03/19/18 12:21 PM

## 2018-03-19 NOTE — Patient Instructions (Addendum)
Memory Compensation Strategies  1. Use "WARM" strategy. W= write it down A=  associate it R=  repeat it M=  make a mental picture  2. You can keep a Social worker. Use a 3-ring notebook with sections for the following:  calendar, important names and phone numbers, medications, doctors' names/phone numbers, "to do list"/reminders, and a section to journal what you did each day  3. Use a calendar to write appointments down.  4. Write yourself a schedule for the day/make a routine This can be placed on the calendar or in a separate section of the Memory Notebook.  Keeping a regular schedule can help memory.  5. Use medication organizer with sections for each day or morning/evening pills  You may need help loading it  6. Keep a basket, or pegboard by the door.   Place items that you need to take out with you in the basket or on the pegboard.  You may also want to include a message board for reminders.  7. Use sticky notes. Place sticky notes with reminders in a place where the task is performed.  For example:  "turn off the stove" placed by the stove, "lock the door" placed on the door at eye level, "take your medications" on the bathroom mirror or by the place where you normally take your medications  8. Use alarms/timers.  Use while cooking to remind yourself to check on food or as a reminder to take your medicine, or as a reminder to make a call, or as a reminder to perform another task, etc.  9. Use a voice memo to record important information and notes for yourself.  10.  Organize and reduce clutter    Keeping Thinking Skills Sharp: 1. Jigsaw puzzles 2. Card/board games 3. Talking on the phone/social events 4. [employer] 5. Online games 6. Word serches/crossword puzzles 7.  Logic puzzles 8. Aerobic exercise (stationary bike) 9. Eating balanced diet (fruits & veggies) 10. Drink water 11. Try something new--new recipe, hobby 12. Crafts 13. Do a variety of activities  that are challenging, but safe

## 2018-03-20 NOTE — Therapy (Signed)
[facility] [address] [place], [place], [address] Phone: [phone]   Fax:  [phone]  Physical Therapy Treatment  Patient Details  Name: Brian [name] MRN: [id number] Date of Birth: 01/14/1941 Referring Provider (PT): Rexene Alberts   Encounter Date: 03/19/2018  PT End of Session - 03/19/18 1111    Visit Number  13    Number of Visits  18    Date for PT Re-Evaluation  04/21/18    Authorization Type  Medicare and [place] Time  1109    PT Stop Time  1147    PT Time Calculation (min)  38 min    Equipment Utilized During Treatment  Gait belt    Activity Tolerance  Patient tolerated treatment well    Behavior During Therapy  [facility] for tasks assessed/performed;Flat affect       Past Medical History:  Diagnosis Date  . Abnormal PFT 1. 05/18/08  2. 11/30/08   1. Showed mild airflow obstruction, mild restriction, mild diffusion defect; FEV1 2.22(64%), FVC 3.33(65%), FEVi% 67, TLC 5.19(69%), DLCO 77%, +BD  2. FEV1 2.38(73%), FVC 3.81(80%), FEV1% 63, TLC 5.61(80%), DLCO 79%, no BD  . Allergic rhinitis   . Anxiety   . BPH (benign prostatic hyperplasia)   . Cellulitis    right leg MRSA  . Erectile dysfunction   . GERD (gastroesophageal reflux disease)   . Heart murmur    mild MR by echo  . Hyperlipidemia   . Hypertension   . Osteoarthritis   . Paresthesias 06/22/2012  . Parkinsonism ([place]) 06/22/2012  . Permanent atrial fibrillation   . Restrictive lung disease     Past Surgical History:  Procedure Laterality Date  . CARDIAC CATHETERIZATION  11/2009   normal coronary arteries  . CARDIOVERSION     multiple  . CATARACT EXTRACTION    . COLONOSCOPY    . COLONOSCOPY WITH PROPOFOL N/A 09/02/2016   Procedure: COLONOSCOPY WITH PROPOFOL;  Surgeon: Garlan Fair, MD;  Location: [facility];  Service: Endoscopy;  Laterality: N/A;  . EYE SURGERY     bilateral cataract extraction  . JOINT REPLACEMENT    . TOTAL  KNEE ARTHROPLASTY Left 11/30/2014   Procedure: TOTAL KNEE ARTHROPLASTY;  Surgeon: Susa Day, MD;  Location: [facility];  Service: Orthopedics;  Laterality: Left;  . TOTAL KNEE ARTHROPLASTY Right 07/12/2015   Procedure: RIGHT TOTAL KNEE ARTHROPLASTY;  Surgeon: Susa Day, MD;  Location[facility];  Service: Orthopedics;  Laterality: Right;  . TRICEPS TENDON REPAIR Right 05/20/2017  . TRICEPS TENDON REPAIR Right 05/20/2017   Procedure: TRICEPS TENDON REPAIR;  Surgeon: Iran Planas, MD;  Location: [facility];  Service: Orthopedics;  Laterality: Right;    There were no vitals filed for this visit.  Subjective Assessment - 03/20/18 0928    Subjective  Been using the laser line with U-Step more consistently and pt/wife see benefit/improved gait. Per wife they have resigned themselves to using the lift chair, because he can't stand up on his own and they do not want him unable to get up when he is home alone.     Patient is accompained by:  Family member   wife   Pertinent History  Fall February 2019 with tricep tendon tear, s/p 3 months [facility] and then [facility] therapies, L TKR 9/216, R TKR 06/2015    Patient Stated Goals  Pt's goal for PT is to drive car again, to help with balance.  Currently in Pain?  No/denies                       [facility] Adult PT Treatment/Exercise - 03/19/18 1700      Transfers   Transfers  Sit to Stand;Stand to Sit    Sit to Stand  5: Supervision    Sit to Stand Details (indicate cue type and reason)  with incr time and prompts ("What is step 1?") pt able to perform sit to stand from chair with armrests and mat table x 5 reps without physical assist. Pt uses "scoot" "tuck" and "forward" as his shortened cues for each step. He has most success with forward scoot to edge of surface with forward lean and then use of bil UEs to lift hips up and forward. Some success with "walking" hips forward, but much less efficient.     Stand to Sit  5: Supervision     Comments  Discussed with wife that if pt consistently uses lift chair for sit to stand, he will get to the point that he can only stand from surfaces that height (due to loss of strength) and will have to have all other surfaces built up to similar height (toilet, bed). Reiterated that therapy does not recommend pt be home alone and getting up/down and to bathroom on his own due to high fall risk.       Ambulation/Gait   Ambulation/Gait Assistance  4: Min assist    Ambulation/Gait Assistance Details  VCs to stop and reset to take larger steps (visualizing stepping over line, stepping "under the U-step RW seat")    Ambulation Distance (Feet)  60 Feet   100   Assistive device  Other (Comment)   Nustep walker   Gait Pattern  Decreased step length - right;Decreased step length - left;Right flexed knee in stance;Shuffle;Festinating;Trunk flexed;Narrow base of support;Poor foot clearance - right;Decreased hip/knee flexion - right;Decreased hip/knee flexion - left;Right foot flat;Left foot flat    Gait Comments  cues for marching for turns and after freezing episodes        P[facility]) - 03/19/18 1700    PWR! exercises  Moves in sitting    PWR! Up  10    Comments  initial 5 with reaching hands to floor with prolonged hip flexion/anterior pelvic tilt stretch; last 5 with hands on knees for anterior chest stretch; use of long sheet around pelvis to maintain anterior pelvic tilt as pt returns from forward bend to upright sitting (for further stretching and then slow release of tension on sheet with pt trying to maintain neutral pelvis          PT Education - 03/20/18 0949    Education Details  discussed upcoming end of POC and pt was overscheduled; pt/wife agree with ending on 1/2 or 1/7 (also PT discussed with OT and SLP and both in agreement with plan)    Person(s) Educated  Patient;Spouse    Methods  Explanation    Comprehension  Verbalized understanding       PT Short Term Goals - 03/01/18  1546      PT SHORT TERM GOAL #1   Title  Pt will perform HEP with wife's supervision for improved posture, transfers, balance, gait.  TARGET 02/26/18    Baseline  Pt able to demo HEP with cues from therapist; at previous visit, wife reports pt is overwhelmed with his exercises-pt reports doing HEP only when he is reminded  Time  5    Period  Weeks    Status  Partially Met      PT SHORT TERM GOAL #2   Title  Pt will perform at least 6 of 10 reps of sit<>stand with minimal UE support for improved transfer efficiency and safety.    Time  5    Period  Weeks    Status  Not Met      PT SHORT TERM GOAL #3   Title  Pt will improve TUG score to less than or equal to 70 seconds for decreased fall risk.    Baseline  61 sec at best with U-step walker    Time  5    Period  Weeks    Status  Achieved      PT SHORT TERM GOAL #4   Title  Pt/wife will demonstrate understanding of tips to reduce freezing with gait and turns.    Time  5    Period  Weeks    Status  Not Met        PT Long Term Goals - 03/15/18 1731      PT LONG TERM GOAL #1   Title  Pt/wife will verbalize understanding of fall prevention to decrease fall risk in home environment.  TARGET 03/26/2018    Time  9    Period  Weeks    Status  New      PT LONG TERM GOAL #2   Title  Pt will perform at least 10 reps of sit<>stand with minimal to no UE support, modified independently for improved transfer efficiency and safety.    Time  9    Period  Weeks    Status  New      PT LONG TERM GOAL #3   Title  Pt will improve TUG score to less than or equal to 50 seconds for decreased fall risk.    Time  9    Period  Weeks    Status  New      PT LONG TERM GOAL #4   Title  Pt will improve gait velocity to at least 1.8 ft/sec for decreased fall risk, improved gait efficiency and safety.    Time  9    Period  Weeks    Status  New      PT LONG TERM GOAL #5   Title  Berg score to improve to at least 30/56 for decreased fall risk.     Baseline  23/56 baseline measure of Berg    Time  9    Period  Weeks    Status  Revised            Plan - 03/19/18 1700    Clinical Impression Statement  Session focused again on technique for sit to stand transfer, gait training, and exercises for anterior/forward weight shift and upright posture. Wife present and again reviewed simple, short cues to assist with increasing pt's success and independence with transfers and gait. Discussed pt being overscheduled beyond POC end date and pt/wife agree to discharge in next 1-2 sessions.     Rehab Potential  Good    Clinical Impairments Affecting Rehab Potential  supportive wife; cognition/severity of PD deficits    PT Frequency  2x / week    PT Duration  Other (comment)   9 weeks   PT Treatment/Interventions  ADLs/Self Care Home Management;DME Instruction;Gait training;Stair training;Functional mobility training;Therapeutic activities;Therapeutic exercise;Balance training;Patient/family education;Neuromuscular re-education    PT Next Visit Plan  begin to check LTGs and ?d/c 1/2 or 1/7; use w/c from lobby or SLP office to gym; use our U-step walker instead of his RW;  Transfer training, focusing on fwd transition, ? add to HEP for forward lean in sitting like modified PWR! Up?   Next week is week 9 of 9 in [place], but pt scheduled further; we need to discuss with pt/wife POC   Consulted and Agree with Plan of Care  Patient;Family member/caregiver    Family Member Consulted  wife       Patient will benefit from skilled therapeutic intervention in order to improve the following deficits and impairments:  Abnormal gait, Decreased balance, Decreased coordination, Decreased mobility, Decreased safety awareness, Difficulty walking, Decreased strength, Impaired flexibility, Postural dysfunction  Visit Diagnosis: Other symptoms and signs involving the nervous system  Other abnormalities of gait and mobility  Abnormal posture  Muscle weakness  (generalized)     Problem List Patient Active Problem List   Diagnosis Date Noted  . Rupture of right triceps tendon 05/20/2017  . Triceps tendon rupture, right, initial encounter 05/20/2017  . Atrial fibrillation ([place]) [I48.91] 02/05/2017  . Urinary retention 12/31/2015  . Acute renal failure (ARF) ([place]) 12/31/2015  . Acute encephalopathy 12/31/2015  . Primary osteoarthritis of right knee 07/12/2015  . Primary osteoarthritis of left knee 11/30/2014  . Encounter for therapeutic drug monitoring 06/21/2013  . Edema of extremities 02/04/2013  . Chronic anticoagulation 02/04/2013  . GERD (gastroesophageal reflux disease)   . Restrictive lung disease   . CKD (chronic kidney disease) stage 2, GFR 60-89 ml/min   . Cellulitis   . Allergic rhinitis   . Anxiety   . Long term (current) use of anticoagulants 01/25/2013  . Parkinsonism ([place]) 06/22/2012  . Paresthesias 06/22/2012  . PULMONARY NODULE 06/02/2008  . SNORING 06/02/2008  . Hyperlipidemia 05/15/2008  . Essential hypertension 05/15/2008  . PULMONARY FUNCTION TESTS, ABNORMAL 05/15/2008    Rexanne Mano, PT 03/20/2018, 10:02 AM  [facility] [address] [place], [place], [address] Phone: [phone]   Fax:  [phone]  Name: FARREL GUIMOND MRN: [id number] Date of Birth: [birth year]

## 2018-03-22 ENCOUNTER — Ambulatory Visit: Payer: Medicare Other | Admitting: Physical Therapy

## 2018-03-22 ENCOUNTER — Ambulatory Visit: Payer: Medicare Other | Admitting: Speech Pathology

## 2018-03-22 ENCOUNTER — Ambulatory Visit: Payer: Medicare Other | Admitting: Occupational Therapy

## 2018-03-23 ENCOUNTER — Ambulatory Visit (INDEPENDENT_AMBULATORY_CARE_PROVIDER_SITE_OTHER): Payer: Medicare Other | Admitting: *Deleted

## 2018-03-23 DIAGNOSIS — I4821 Permanent atrial fibrillation: Secondary | ICD-10-CM | POA: Diagnosis not present

## 2018-03-23 DIAGNOSIS — Z5181 Encounter for therapeutic drug level monitoring: Secondary | ICD-10-CM | POA: Diagnosis not present

## 2018-03-23 DIAGNOSIS — I4891 Unspecified atrial fibrillation: Secondary | ICD-10-CM

## 2018-03-23 DIAGNOSIS — Z23 Encounter for immunization: Secondary | ICD-10-CM | POA: Diagnosis not present

## 2018-03-23 LAB — POCT INR: INR: 2.6 (ref 2.0–3.0)

## 2018-03-23 NOTE — Patient Instructions (Signed)
Description   Continue taking 1 tablet daily. Continue eating dark green leafy veggies three times a week. Recheck INR in 4 weeks. Call with any questions or concerns [facility] [phone]

## 2018-03-25 ENCOUNTER — Ambulatory Visit: Payer: Medicare Other | Admitting: Physical Therapy

## 2018-03-25 ENCOUNTER — Ambulatory Visit: Payer: Medicare Other | Attending: Internal Medicine | Admitting: Speech Pathology

## 2018-03-25 ENCOUNTER — Ambulatory Visit: Payer: Medicare Other | Admitting: Occupational Therapy

## 2018-03-25 ENCOUNTER — Encounter: Payer: Self-pay | Admitting: Physical Therapy

## 2018-03-25 DIAGNOSIS — R293 Abnormal posture: Secondary | ICD-10-CM | POA: Diagnosis not present

## 2018-03-25 DIAGNOSIS — R471 Dysarthria and anarthria: Secondary | ICD-10-CM | POA: Diagnosis not present

## 2018-03-25 DIAGNOSIS — R29818 Other symptoms and signs involving the nervous system: Secondary | ICD-10-CM

## 2018-03-25 DIAGNOSIS — M6281 Muscle weakness (generalized): Secondary | ICD-10-CM

## 2018-03-25 DIAGNOSIS — R278 Other lack of coordination: Secondary | ICD-10-CM | POA: Diagnosis not present

## 2018-03-25 DIAGNOSIS — R41844 Frontal lobe and executive function deficit: Secondary | ICD-10-CM

## 2018-03-25 DIAGNOSIS — R2689 Other abnormalities of gait and mobility: Secondary | ICD-10-CM

## 2018-03-25 DIAGNOSIS — R2681 Unsteadiness on feet: Secondary | ICD-10-CM

## 2018-03-25 NOTE — Patient Instructions (Signed)
If your voice sounds or feels hoarse, you are not talking loud enough. Speak louder!

## 2018-03-25 NOTE — Therapy (Signed)
[facility] [address] [place], [place], [address] Phone: [phone]   Fax:  [phone]  Physical Therapy Treatment  Patient Details  Name: Brian [name] MRN: [id number] Date of Birth: 11-05-1940 Referring Provider (PT): Rexene Alberts   Encounter Date: 03/25/2018  PT End of Session - 03/25/18 1913    Visit Number  14    Number of Visits  18    Date for PT Re-Evaluation  04/21/18    Authorization Type  Medicare and [place]    PT Start Time  1317    PT Stop Time  1401    PT Time Calculation (min)  44 min    Equipment Utilized During Treatment  Gait belt    Activity Tolerance  Patient tolerated treatment well    Behavior During Therapy  [facility] for tasks assessed/performed;Flat affect       Past Medical History:  Diagnosis Date  . Abnormal PFT 1. 05/18/08  2. 11/30/08   1. Showed mild airflow obstruction, mild restriction, mild diffusion defect; FEV1 2.22(64%), FVC 3.33(65%), FEVi% 67, TLC 5.19(69%), DLCO 77%, +BD  2. FEV1 2.38(73%), FVC 3.81(80%), FEV1% 63, TLC 5.61(80%), DLCO 79%, no BD  . Allergic rhinitis   . Anxiety   . BPH (benign prostatic hyperplasia)   . Cellulitis    right leg MRSA  . Erectile dysfunction   . GERD (gastroesophageal reflux disease)   . Heart murmur    mild MR by echo  . Hyperlipidemia   . Hypertension   . Osteoarthritis   . Paresthesias 06/22/2012  . Parkinsonism ([place]) 06/22/2012  . Permanent atrial fibrillation   . Restrictive lung disease     Past Surgical History:  Procedure Laterality Date  . CARDIAC CATHETERIZATION  11/2009   normal coronary arteries  . CARDIOVERSION     multiple  . CATARACT EXTRACTION    . COLONOSCOPY    . COLONOSCOPY WITH PROPOFOL N/A 09/02/2016   Procedure: COLONOSCOPY WITH PROPOFOL;  Surgeon: Garlan Fair, MD;  [facility];  Service: Endoscopy;  Laterality: N/A;  . EYE SURGERY     bilateral cataract extraction  . JOINT REPLACEMENT    . TOTAL  KNEE ARTHROPLASTY Left 11/30/2014   Procedure: TOTAL KNEE ARTHROPLASTY;  Surgeon: Susa Day, MD;  [facility];  Service: Orthopedics;  Laterality: Left;  . TOTAL KNEE ARTHROPLASTY Right 07/12/2015   Procedure: RIGHT TOTAL KNEE ARTHROPLASTY;  Surgeon: Susa Day, MD;  [facility];  Service: Orthopedics;  Laterality: Right;  . TRICEPS TENDON REPAIR Right 05/20/2017  . TRICEPS TENDON REPAIR Right 05/20/2017   Procedure: TRICEPS TENDON REPAIR;  Surgeon: Iran Planas, MD;  Location: [facility];  Service: Orthopedics;  Laterality: Right;    There were no vitals filed for this visit.  Subjective Assessment - 03/25/18 1313    Subjective  Wife says he's doing better today and would like for him to use RW today instead of U-step coming back into therapy.  Got a pedaler for hands and for feet, and I've been using it a few times.    Patient is accompained by:  Family member   wife   Pertinent History  Fall February 2019 with tricep tendon tear, s/p 3 months [facility] and then [facility] therapies, L TKR 9/216, R TKR 06/2015    Patient Stated Goals  Pt's goal for PT is to drive car again, to help with balance.    Currently in Pain?  No/denies  [facility] Adult PT Treatment/Exercise - 03/25/18 0001      Transfers   Transfers  Sit to Stand;Stand to Sit    Sit to Stand  5: Supervision    Sit to Stand Details (indicate cue type and reason)  Improved ease of sit<>stand transfers throughout session today    Stand to Sit  5: Supervision    Number of Reps  Other reps (comment)   at least 5 reps through session     Ambulation/Gait   Ambulation/Gait  Yes    Ambulation/Gait Assistance  4: Min guard    Ambulation/Gait Assistance Details  VCs  for stopping, resetting, taking large steps to turn to sit    Ambulation Distance (Feet)  60 Feet   , 70 ft x 2   Assistive device  Other (Comment)   U-step RW after coming into therapy with his RW   Gait Pattern  Decreased  step length - right;Decreased step length - left;Right flexed knee in stance;Shuffle;Festinating;Trunk flexed;Narrow base of support;Poor foot clearance - right;Decreased hip/knee flexion - right;Decreased hip/knee flexion - left;Right foot flat;Left foot flat    Gait velocity  23.41 sec = 1.4 ft/sec    Pre-Gait Activities  Standing at locked walker:  marching in place x 10 reps-cues for high step, wide BOS       Standardized Balance Assessment   Standardized Balance Assessment  Timed Up and Go Test      Timed Up and Go Test   TUG  Normal TUG    Normal TUG (seconds)  108.63   63.32 second trial     Self-Care   Self-Care  Other Self-Care Comments    Other Self-Care Comments   Discussed POC, including plans for upcoming d/c.  Pt would like to keep the 03/30/18 visit already scheduled, in part to review newly added HEP and follow up on any questions prior to d/c.  Discussed improvements in gait velocity and TUG scores, but still at fall risk.  Discussed need for supervision for perfomance of HEP (to help patient to remember to do them consistently and correctly).  Pt/wife in agreement with return screen/eval in 3-6 months.      Knee/Hip Exercises: Seated   Other Seated Knee/Hip Exercises  Reviewed HEP, including seated pelvic tilts x 5 reps, seated ankle pumps x 10 reps, then seated hamstring stretch x 2 reps each leg, 30 seconds    Sit to Sand  --   Verbally reviewed/demo, 3-5 reps 3x/day as part of HEP            PT Education - 03/25/18 1912    Education Details  Added to HEP -standing marching at locked walker with family close supervision; discussed POC and plans for d/c on 03/30/18 visit    Person(s) Educated  Patient;Spouse    Methods  Explanation;Demonstration;Handout    Comprehension  Verbalized understanding;Returned demonstration;Verbal cues required       PT Short Term Goals - 03/01/18 1546      PT SHORT TERM GOAL #1   Title  Pt will perform HEP with wife's supervision for  improved posture, transfers, balance, gait.  TARGET 02/26/18    Baseline  Pt able to demo HEP with cues from therapist; at previous visit, wife reports pt is overwhelmed with his exercises-pt reports doing HEP only when he is reminded    Time  5    Period  Weeks    Status  Partially Met      PT SHORT TERM  GOAL #2   Title  Pt will perform at least 6 of 10 reps of sit<>stand with minimal UE support for improved transfer efficiency and safety.    Time  5    Period  Weeks    Status  Not Met      PT SHORT TERM GOAL #3   Title  Pt will improve TUG score to less than or equal to 70 seconds for decreased fall risk.    Baseline  61 sec at best with U-step walker    Time  5    Period  Weeks    Status  Achieved      PT SHORT TERM GOAL #4   Title  Pt/wife will demonstrate understanding of tips to reduce freezing with gait and turns.    Time  5    Period  Weeks    Status  Not Met        PT Long Term Goals - 03/25/18 1914      PT LONG TERM GOAL #1   Title  Pt/wife will verbalize understanding of fall prevention to decrease fall risk in home environment.  TARGET 03/26/2018    Baseline  Pt/wife have been provided fall prevention education    Time  9    Period  Weeks    Status  On-going      PT LONG TERM GOAL #2   Title  Pt will perform at least 10 reps of sit<>stand with minimal to no UE support, modified independently for improved transfer efficiency and safety.    Baseline  requires UE support, supervision     Time  9    Period  Weeks    Status  On-going      PT LONG TERM GOAL #3   Title  Pt will improve TUG score to less than or equal to 50 seconds for decreased fall risk.    Baseline  63.32 sec 03/25/18    Time  9    Period  Weeks    Status  Not Met      PT LONG TERM GOAL #4   Title  Pt will improve gait velocity to at least 1.8 ft/sec for decreased fall risk, improved gait efficiency and safety.    Baseline  1.4 ft/sec 03/25/18    Time  9    Period  Weeks    Status  Not Met       PT LONG TERM GOAL #5   Title  Berg score to improve to at least 30/56 for decreased fall risk.    Baseline  23/56 baseline measure of Berg    Time  9    Period  Weeks    Status  On-going            Plan - 03/25/18 1916    Clinical Impression Statement  Pt markedly improved with sit<>stand transfers from both 18" chairs and mat surfaces today, using UE support, but he does still require supervision upon standing to avoid LOB (pt had one LOB with initial attempt at TUG due to trying to take a step without UEs on U-step RW).  Began assessing LTGs this visit, with pt not meeting TUG score (though improved to 63.35 seconds) and not meeting gait velocity score (though improved to 1.4 ft/sec).  Pt has new foot pedaler for home, so some of session focused on review of current HEP, addition of standing marching and discussion of 10-15 minutes, 1-2x/day for foot pedaler as part of consistent,  ongoing HEP.  Remaining goals, 1, 2, 3 are ongoing and will be assessed next visit for d/c.Marland Kitchen    Rehab Potential  Good    Clinical Impairments Affecting Rehab Potential  supportive wife; cognition/severity of PD deficits    PT Frequency  1x / week    PT Duration  Other (comment)   Recert complete for 1 additional visit beyond 03/25/18   PT Treatment/Interventions  ADLs/Self Care Home Management;DME Instruction;Gait training;Stair training;Functional mobility training;Therapeutic activities;Therapeutic exercise;Balance training;Patient/family education;Neuromuscular re-education    PT Next Visit Plan  Check remaining LTGs and d/c 03/30/18 visit; review HEP and use of foot pedaler at home    Consulted and Agree with Plan of Care  Patient;Family member/caregiver    Family Member Consulted  wife       Patient will benefit from skilled therapeutic intervention in order to improve the following deficits and impairments:  Abnormal gait, Decreased balance, Decreased coordination, Decreased mobility, Decreased safety  awareness, Difficulty walking, Decreased strength, Impaired flexibility, Postural dysfunction  Visit Diagnosis: Other abnormalities of gait and mobility  Unsteadiness on feet  Abnormal posture     Problem List Patient Active Problem List   Diagnosis Date Noted  . Rupture of right triceps tendon 05/20/2017  . Triceps tendon rupture, right, initial encounter 05/20/2017  . Atrial fibrillation ([place]) [I48.91] 02/05/2017  . Urinary retention 12/31/2015  . Acute renal failure (ARF) ([place]) 12/31/2015  . Acute encephalopathy 12/31/2015  . Primary osteoarthritis of right knee 07/12/2015  . Primary osteoarthritis of left knee 11/30/2014  . Encounter for therapeutic drug monitoring 06/21/2013  . Edema of extremities 02/04/2013  . Chronic anticoagulation 02/04/2013  . GERD (gastroesophageal reflux disease)   . Restrictive lung disease   . CKD (chronic kidney disease) stage 2, GFR 60-89 ml/min   . Cellulitis   . Allergic rhinitis   . Anxiety   . Long term (current) use of anticoagulants 01/25/2013  . Parkinsonism ([place]) 06/22/2012  . Paresthesias 06/22/2012  . PULMONARY NODULE 06/02/2008  . SNORING 06/02/2008  . Hyperlipidemia 05/15/2008  . Essential hypertension 05/15/2008  . PULMONARY FUNCTION TESTS, ABNORMAL 05/15/2008    Jevaughn Degollado W. 03/25/2018, 7:22 PM  Frazier Butt., PT   [facility] [address] [place], [place], [address] Phone: [phone]   Fax:  [phone]  Name: Brian [name] MRN: [id number] Date of Birth: 1940-10-05  PT Long Term Goals - 03/25/18 1923      PT LONG TERM GOAL #1   Title  Pt/wife will verbalize understanding of fall prevention to decrease fall risk in home environment.  TARGET 03/30/18    Baseline  Pt/wife have been provided fall prevention education    Time  1    Period  Weeks    Status  On-going    Target Date  03/30/18      PT LONG TERM GOAL #2   Title  Pt will perform at  least 10 reps of sit<>stand with minimal to no UE support, modified independently for improved transfer efficiency and safety.    Baseline  requires UE support, supervision     Time  1    Period  Weeks    Status  On-going    Target Date  03/30/18      PT LONG TERM GOAL #3   Title  Pt will improve TUG score to less than or equal to 50 seconds for decreased fall risk.    Baseline  63.32 sec 03/25/18  Time  9    Period  Weeks    Status  Not Met      PT LONG TERM GOAL #4   Title  Pt will improve gait velocity to at least 1.8 ft/sec for decreased fall risk, improved gait efficiency and safety.    Baseline  1.4 ft/sec 03/25/18    Time  9    Period  Weeks    Status  Not Met      PT LONG TERM GOAL #5   Title  Berg score to improve to at least 30/56 for decreased fall risk.    Baseline  23/56 baseline measure of Berg    Time  1    Period  Weeks    Status  On-going    Target Date  03/30/18      Mady Haagensen, PT 03/25/18 7:25 PM Phone: [phone] Fax: [phone]

## 2018-03-25 NOTE — Patient Instructions (Addendum)
"  I love a Parade" Lift-Marching in place at locked walker    Standing at your walker (LOCKED), with family member close by for supervision.  Marching in place, making sure to lift each leg high and set feet down wide. Repeat _10___ times. Do _2___ sessions per day.  http://gt2.exer.us/344   Copyright  VHI. All rights reserved.

## 2018-03-26 NOTE — Therapy (Signed)
[facility] [address] [place], [place], [address] Phone: [phone]   Fax:  [phone]  Speech Language Pathology Treatment  Patient Details  Name: Brian [name] MRN: [id number] Date of Birth: [birth year] Referring Provider (SLP): Star Age, MD   Encounter Date: 03/25/2018  End of Session - 03/26/18 1843    Visit Number  13    Number of Visits  17    Date for SLP Re-Evaluation  04/21/18    SLP Start Time  1452    SLP Stop Time   1530    SLP Time Calculation (min)  38 min    Activity Tolerance  Patient tolerated treatment well       Past Medical History:  Diagnosis Date  . Abnormal PFT 1. 05/18/08  2. 11/30/08   1. Showed mild airflow obstruction, mild restriction, mild diffusion defect; FEV1 2.22(64%), FVC 3.33(65%), FEVi% 67, TLC 5.19(69%), DLCO 77%, +BD  2. FEV1 2.38(73%), FVC 3.81(80%), FEV1% 63, TLC 5.61(80%), DLCO 79%, no BD  . Allergic rhinitis   . Anxiety   . BPH (benign prostatic hyperplasia)   . Cellulitis    right leg MRSA  . Erectile dysfunction   . GERD (gastroesophageal reflux disease)   . Heart murmur    mild MR by echo  . Hyperlipidemia   . Hypertension   . Osteoarthritis   . Paresthesias 06/22/2012  . Parkinsonism ([place]) 06/22/2012  . Permanent atrial fibrillation   . Restrictive lung disease     Past Surgical History:  Procedure Laterality Date  . CARDIAC CATHETERIZATION  11/2009   normal coronary arteries  . CARDIOVERSION     multiple  . CATARACT EXTRACTION    . COLONOSCOPY    . COLONOSCOPY WITH PROPOFOL N/A 09/02/2016   Procedure: COLONOSCOPY WITH PROPOFOL;  Surgeon: Garlan Fair, MD;  Location: [facility];  Service: Endoscopy;  Laterality: N/A;  . EYE SURGERY     bilateral cataract extraction  . JOINT REPLACEMENT    . TOTAL KNEE ARTHROPLASTY Left 11/30/2014   Procedure: TOTAL KNEE ARTHROPLASTY;  Surgeon: Susa Day, MD;  Location: [facility];  Service: Orthopedics;   Laterality: Left;  . TOTAL KNEE ARTHROPLASTY Right 07/12/2015   Procedure: RIGHT TOTAL KNEE ARTHROPLASTY;  Surgeon: Susa Day, MD;  Location: [facility];  Service: Orthopedics;  Laterality: Right;  . TRICEPS TENDON REPAIR Right 05/20/2017  . TRICEPS TENDON REPAIR Right 05/20/2017   Procedure: TRICEPS TENDON REPAIR;  Surgeon: Iran Planas, MD;  Location: [facility];  Service: Orthopedics;  Laterality: Right;    There were no vitals filed for this visit.  Subjective Assessment - 03/25/18 1456    Subjective  "She was telling me to speak louder."    Patient is accompained by:  Family member                SLP Short Term Goals - 03/15/18 0950      SLP SHORT TERM GOAL #1   Title  Pt will generate loud /a/ with average of upper 80s dB over 3 sessions    Status  Achieved      SLP SHORT TERM GOAL #2   Title  Pt will use abdominal breathing at rest 85% success over 2 sessions    Status  Not Met      SLP SHORT TERM GOAL #3   Title  Pt will use speech volume average low 70sdB when responding with sentences 18/20 over two sessions    Status  Not Met      SLP SHORT TERM GOAL #4   Title  pt will tell SLP 3 anomia strategies over three sessions    Status  Not Met       SLP Long Term Goals - 03/25/18 1522      SLP LONG TERM GOAL #1   Title  pt will generate loud /a/ average low 90s dB over 6 sessions    Baseline  02-11-18    Time  1   or 17 visits, for all LTGs   Period  Weeks    Status  On-going      SLP LONG TERM GOAL #2   Title  Pt will use abdominal breathing in sentences or short conversation 80% success over 2 sessions    Time  1    Period  Weeks    Status  On-going      SLP LONG TERM GOAL #3   Title  pt will use anomia compensations in 75% of opportunities in sentence responses, with min verbal cues to use them, over 3 sessions    Time  1    Period  Weeks    Status  On-going      SLP LONG TERM GOAL #4   Title  pt will undergo objective swallow eval PRN    Status   Deferred       Plan - 03/26/18 1843    Clinical Impression Statement  Pt cont to present with dysarthria including reduced speech volume. Wife present today and receptive to cont'd instructions for cuing pt at home for anomia and louder speech.  No overt s/s aspiration PNA today, and no s/sx of aspiration reported at home. Pt may benefit from a modified barium swallow or FEES should he present with swallowing difficulties. He would benefit from skilled ST addressing these skills to communicate more efficiently. Anticipate d/c next session due to pt/wife's request (scheduling conflicts).     Speech Therapy Frequency  2x / week    Duration  --   8 weeks or 17 visits   Treatment/Interventions  Aspiration precaution training;Pharyngeal strengthening exercises;Diet toleration management by SLP;Language facilitation;Compensatory techniques;Internal/external aids;SLP instruction and feedback;Multimodal communcation approach;Functional tasks;Patient/family education;Compensatory strategies;Trials of upgraded texture/liquids;Cueing hierarchy;Environmental controls;Cognitive reorganization    Potential to Achieve Goals  Fair    Potential Considerations  Ability to learn/carryover information    Consulted and Agree with Plan of Care  Patient;Family member/caregiver    Family Member Consulted  wife       Patient will benefit from skilled therapeutic intervention in order to improve the following deficits and impairments:   Dysarthria and anarthria    Problem List Patient Active Problem List   Diagnosis Date Noted  . Rupture of right triceps tendon 05/20/2017  . Triceps tendon rupture, right, initial encounter 05/20/2017  . Atrial fibrillation ([place]) [I48.91] 02/05/2017  . Urinary retention 12/31/2015  . Acute renal failure (ARF) ([place]) 12/31/2015  . Acute encephalopathy 12/31/2015  . Primary osteoarthritis of right knee 07/12/2015  . Primary osteoarthritis of left knee 11/30/2014  . Encounter for  therapeutic drug monitoring 06/21/2013  . Edema of extremities 02/04/2013  . Chronic anticoagulation 02/04/2013  . GERD (gastroesophageal reflux disease)   . Restrictive lung disease   . CKD (chronic kidney disease) stage 2, GFR 60-89 ml/min   . Cellulitis   . Allergic rhinitis   . Anxiety   . Long term (current) use of anticoagulants 01/25/2013  . Parkinsonism ([place]) 06/22/2012  . Paresthesias 06/22/2012  .  PULMONARY NODULE 06/02/2008  . SNORING 06/02/2008  . Hyperlipidemia 05/15/2008  . Essential hypertension 05/15/2008  . PULMONARY FUNCTION TESTS, ABNORMAL 05/15/2008   Deneise Lever, [place], [place] 03/26/2018, 6:48 PM  [facility] [address] [place], [place], [address] Phone: [phone]   Fax:  [phone]   Name: Brian [name] MRN: [id number] Date of Birth: 06/02/1940

## 2018-03-26 NOTE — Therapy (Signed)
[facility] [address] [place], [place], [address] Phone: [phone]   Fax:  [phone]  Occupational Therapy Treatment  Patient Details  Name: Brian [name] MRN: [id number] Date of Birth: [birth year] Referring Provider (OT): Dr. Rexene Alberts   Encounter Date: 03/25/2018  OT End of Session - 03/25/18 1441    Visit Number  12    Number of Visits  25    Date for OT Re-Evaluation  04/22/18    Authorization Type  Medicare    Authorization Time Period  cert 12 weeks, anticipate d/c following 8 weeks    Authorization - Visit Number  12    Authorization - Number of Visits  20    OT Start Time  6387    OT Stop Time  1445    OT Time Calculation (min)  42 min    Activity Tolerance  Patient tolerated treatment well    Behavior During Therapy  [facility] for tasks assessed/performed;Flat affect       Past Medical History:  Diagnosis Date  . Abnormal PFT 1. 05/18/08  2. 11/30/08   1. Showed mild airflow obstruction, mild restriction, mild diffusion defect; FEV1 2.22(64%), FVC 3.33(65%), FEVi% 67, TLC 5.19(69%), DLCO 77%, +BD  2. FEV1 2.38(73%), FVC 3.81(80%), FEV1% 63, TLC 5.61(80%), DLCO 79%, no BD  . Allergic rhinitis   . Anxiety   . BPH (benign prostatic hyperplasia)   . Cellulitis    right leg MRSA  . Erectile dysfunction   . GERD (gastroesophageal reflux disease)   . Heart murmur    mild MR by echo  . Hyperlipidemia   . Hypertension   . Osteoarthritis   . Paresthesias 06/22/2012  . Parkinsonism ([place]) 06/22/2012  . Permanent atrial fibrillation   . Restrictive lung disease     Past Surgical History:  Procedure Laterality Date  . CARDIAC CATHETERIZATION  11/2009   normal coronary arteries  . CARDIOVERSION     multiple  . CATARACT EXTRACTION    . COLONOSCOPY    . COLONOSCOPY WITH PROPOFOL N/A 09/02/2016   Procedure: COLONOSCOPY WITH PROPOFOL;  Surgeon: Garlan Fair, MD;  Location: [facility];  Service: Endoscopy;   Laterality: N/A;  . EYE SURGERY     bilateral cataract extraction  . JOINT REPLACEMENT    . TOTAL KNEE ARTHROPLASTY Left 11/30/2014   Procedure: TOTAL KNEE ARTHROPLASTY;  Surgeon: Susa Day, MD;  Location: [facility];  Service: Orthopedics;  Laterality: Left;  . TOTAL KNEE ARTHROPLASTY Right 07/12/2015   Procedure: RIGHT TOTAL KNEE ARTHROPLASTY;  Surgeon: Susa Day, MD;  [facility];  Service: Orthopedics;  Laterality: Right;  . TRICEPS TENDON REPAIR Right 05/20/2017  . TRICEPS TENDON REPAIR Right 05/20/2017   Procedure: TRICEPS TENDON REPAIR;  Surgeon: Iran Planas, MD;  Location: [facility];  Service: Orthopedics;  Laterality: Right;    There were no vitals filed for this visit.  Subjective Assessment - 03/26/18 0910    Subjective   Pt's wife reports they are planning to hire someone to come in for a few hours to help patient    Patient Stated Goals  to be as independent as he can, to be able dress himself    Currently in Pain?  No/denies                 Treatment: PWR ! Supine basic 4, modified PWR! Step for hip lifts then stepping, and reminded pt how to use for bed mobility, mod v.c and demonstration.  Reviwed ball exercises seated, shoulder flexion and diagonals, 5-10 reps each, pt was on off time so slower performance. Arm bike x 5 mins for conditioning, min v.c for intensity           OT Education - 03/26/18 0911    Education Details  recommendation that pt's wife allows him to assist with ADLs as much as possible but that he will need help getting started, reviewed PWR! supine basic 4, 10 reps each, and ball exercises seated, recommendation that patient has someone with him when he is upwalking and to facilitate sit to stand when pt is having difficulty    Person(s) Educated  Patient;Spouse    Methods  Explanation;Handout    Comprehension  Verbalized understanding;Returned demonstration;Verbal cues required       OT Short Term Goals - 03/19/18 1030       OT SHORT TERM GOAL #1   Title  Pt/ caregeiver willl be I with PD specific HEP    Status  Achieved   Pt's wife verbalizes understanding     OT SHORT TERM GOAL #2   Title  Pt /wife will verbalize understanding of adapted strategies for ADLS/ IADLs to maximize safety and independence.    Status  On-going   needs reinforcement     OT SHORT TERM GOAL #3   Title  Pt will perfom LB dressing with min A    Status  On-going   03/15/18:  per pt, wife still providing max A at home, but pt mod A/cues and incr time for shoes/socks in clinic     OT [place] #4   Title  Pt will demonstrate improved fine motor coordination as evidenced by decreasing 9 hole pegs test by 4 secs bilaterally.    Baseline  RUE 68.66 secs, LUE 68.82 secs    Status  Achieved   03/19/18:  R-57.53sec, L-59.75sec     OT SHORT TERM GOAL #5   Title  Pt/ wife will verbalize understanding of compensatory strategies for memory/ cognition    Status  Achieved   03/19/18:  education provided       OT Long Term Goals - 03/19/18 1034      OT LONG TERM GOAL #1   Title  Pt and wife will verbalize understanding of ways to prevent future complications and appropriate community resources    Time  12    Period  Weeks    Status  On-going      OT LONG TERM GOAL #2   Title  Pt will demonstrate aiblity to retrieve a lightweight object at 115 shoulder flexion and -35 elbow extension with RUE.    Time  12    Period  Weeks    Status  Achieved   03/19/18:  120* with -25* elbow ext     OT LONG TERM GOAL #3   Title  Pt.will demonstrate ability to retrieve a lightweight obect at 120 shoulder flexion with LUE.    Time  12    Period  Weeks    Status  Achieved   03/19/18:  130*     OT LONG TERM GOAL #4   Title  Pt will demonstrate improved gross motor coordination/ bilateral UE functional use for ADLs as evidenced by increasing box/ blocks score bilaterally by 3 blocks    Baseline  RUE 26 blocks, LUe 24 blocks     Time  12     Period  Weeks    Status  On-going  03/19/18:  R-24blocks, L-19blocks     OT LONG TERM GOAL #5   Title  Pt will demonstrae ability to fasten 3 buttons in 75 secs or less    Time  12    Period  Weeks    Status  New            Plan - 03/25/18 1442    Clinical Impression Statement  Pt is progressing slowly towards goals. Anticipate d/c next visit.    Occupational Profile and client history currently impacting functional performance  Pt was independent with ADLS prior to his triceps tear in February, now he requires assistance with ADLS and has had several falls.MCN:[id number]'O disease(diagnosed with parkinsonism 2012)triceps tear Feb 2019,R TKR 2016, L TKR 2017, anxiety, arthritis    Occupational performance deficits (Please refer to evaluation for details):  IADL's;ADL's;Leisure;Social Participation    Rehab Potential  Fair    OT Frequency  2x / week    OT Duration  12 weeks    OT Treatment/Interventions  Self-care/ADL training;Therapeutic exercise;Gait Training;Aquatic Therapy;Moist Heat;Paraffin;Neuromuscular education;Balance training;Patient/family education;Therapeutic activities;Functional Mobility Training;Fluidtherapy;Cryotherapy;Ultrasound;Contrast Bath;DME and/or AE instruction;Manual Therapy;Passive range of motion;Cognitive remediation/compensation    Plan  check remaining goals, anticipate d/c next visit, recommend lifeline    Consulted and Agree with Plan of Care  Patient;Family member/caregiver    Family Member Consulted  wife       Patient will benefit from skilled therapeutic intervention in order to improve the following deficits and impairments:  Abnormal gait, Decreased cognition, Impaired flexibility, Pain, Decreased coordination, Decreased mobility, Decreased strength, Decreased range of motion, Decreased activity tolerance, Decreased balance, Decreased endurance, Decreased knowledge of precautions, Decreased safety awareness, Difficulty walking, Impaired perceived  functional ability, Impaired UE functional use  Visit Diagnosis: Other symptoms and signs involving the nervous system  Abnormal posture  Other lack of coordination  Muscle weakness (generalized)  Frontal lobe and executive function deficit  Other abnormalities of gait and mobility    Problem List Patient Active Problem List   Diagnosis Date Noted  . Rupture of right triceps tendon 05/20/2017  . Triceps tendon rupture, right, initial encounter 05/20/2017  . Atrial fibrillation ([place]) [I48.91] 02/05/2017  . Urinary retention 12/31/2015  . Acute renal failure (ARF) ([place]) 12/31/2015  . Acute encephalopathy 12/31/2015  . Primary osteoarthritis of right knee 07/12/2015  . Primary osteoarthritis of left knee 11/30/2014  . Encounter for therapeutic drug monitoring 06/21/2013  . Edema of extremities 02/04/2013  . Chronic anticoagulation 02/04/2013  . GERD (gastroesophageal reflux disease)   . Restrictive lung disease   . CKD (chronic kidney disease) stage 2, GFR 60-89 ml/min   . Cellulitis   . Allergic rhinitis   . Anxiety   . Long term (current) use of anticoagulants 01/25/2013  . Parkinsonism ([place]) 06/22/2012  . Paresthesias 06/22/2012  . PULMONARY NODULE 06/02/2008  . SNORING 06/02/2008  . Hyperlipidemia 05/15/2008  . Essential hypertension 05/15/2008  . PULMONARY FUNCTION TESTS, ABNORMAL 05/15/2008    Caelan Branden 03/26/2018, 9:14 AM  [facility] [address] [place], [place], [address] Phone: [phone]   Fax:  [phone]  Name: JOENATHAN SAKUMA MRN: [id number] Date of Birth: Apr 12, 1940

## 2018-03-30 ENCOUNTER — Ambulatory Visit: Payer: Medicare Other

## 2018-03-30 ENCOUNTER — Ambulatory Visit: Payer: Medicare Other | Admitting: Physical Therapy

## 2018-03-30 ENCOUNTER — Encounter: Payer: Self-pay | Admitting: Physical Therapy

## 2018-03-30 ENCOUNTER — Ambulatory Visit: Payer: Medicare Other | Admitting: Occupational Therapy

## 2018-03-30 DIAGNOSIS — R2681 Unsteadiness on feet: Secondary | ICD-10-CM

## 2018-03-30 DIAGNOSIS — R278 Other lack of coordination: Secondary | ICD-10-CM | POA: Diagnosis not present

## 2018-03-30 DIAGNOSIS — R293 Abnormal posture: Secondary | ICD-10-CM

## 2018-03-30 DIAGNOSIS — R29818 Other symptoms and signs involving the nervous system: Secondary | ICD-10-CM | POA: Diagnosis not present

## 2018-03-30 DIAGNOSIS — M6281 Muscle weakness (generalized): Secondary | ICD-10-CM | POA: Diagnosis not present

## 2018-03-30 DIAGNOSIS — R41844 Frontal lobe and executive function deficit: Secondary | ICD-10-CM

## 2018-03-30 DIAGNOSIS — R471 Dysarthria and anarthria: Secondary | ICD-10-CM

## 2018-03-30 DIAGNOSIS — R2689 Other abnormalities of gait and mobility: Secondary | ICD-10-CM

## 2018-03-30 NOTE — Therapy (Signed)
[facility] [address] [place], [place], [address] Phone: [phone]   Fax:  [phone]  Occupational Therapy Treatment  Patient Details  Name: Brian [name] MRN: [id number] Date of Birth: [birth year] Referring Provider (OT): Dr. Rexene Alberts   Encounter Date: 03/30/2018  OT End of Session - 03/30/18 1035    Visit Number  13    Number of Visits  25    Date for OT Re-Evaluation  04/22/18    Authorization Type  Medicare    Authorization Time Period  cert 12 weeks, anticipate d/c following 8 weeks    Authorization - Visit Number  13    Authorization - Number of Visits  20    OT Start Time  1018    OT Stop Time  1100    OT Time Calculation (min)  42 min       Past Medical History:  Diagnosis Date  . Abnormal PFT 1. 05/18/08  2. 11/30/08   1. Showed mild airflow obstruction, mild restriction, mild diffusion defect; FEV1 2.22(64%), FVC 3.33(65%), FEVi% 67, TLC 5.19(69%), DLCO 77%, +BD  2. FEV1 2.38(73%), FVC 3.81(80%), FEV1% 63, TLC 5.61(80%), DLCO 79%, no BD  . Allergic rhinitis   . Anxiety   . BPH (benign prostatic hyperplasia)   . Cellulitis    right leg MRSA  . Erectile dysfunction   . GERD (gastroesophageal reflux disease)   . Heart murmur    mild MR by echo  . Hyperlipidemia   . Hypertension   . Osteoarthritis   . Paresthesias 06/22/2012  . Parkinsonism ([place]) 06/22/2012  . Permanent atrial fibrillation   . Restrictive lung disease     Past Surgical History:  Procedure Laterality Date  . CARDIAC CATHETERIZATION  11/2009   normal coronary arteries  . CARDIOVERSION     multiple  . CATARACT EXTRACTION    . COLONOSCOPY    . COLONOSCOPY WITH PROPOFOL N/A 09/02/2016   Procedure: COLONOSCOPY WITH PROPOFOL;  Surgeon: Garlan Fair, MD;  Location: [facility];  Service: Endoscopy;  Laterality: N/A;  . EYE SURGERY     bilateral cataract extraction  . JOINT REPLACEMENT    . TOTAL KNEE ARTHROPLASTY Left 11/30/2014   Procedure: TOTAL KNEE ARTHROPLASTY;  Surgeon: Susa Day, MD;  Location: [facility];  Service: Orthopedics;  Laterality: Left;  . TOTAL KNEE ARTHROPLASTY Right 07/12/2015   Procedure: RIGHT TOTAL KNEE ARTHROPLASTY;  Surgeon: Susa Day, MD;  Location: [facility];  Service: Orthopedics;  Laterality: Right;  . TRICEPS TENDON REPAIR Right 05/20/2017  . TRICEPS TENDON REPAIR Right 05/20/2017   Procedure: TRICEPS TENDON REPAIR;  Surgeon: Iran Planas, MD;  Location: [facility];  Service: Orthopedics;  Laterality: Right;    There were no vitals filed for this visit. Subjective Assessment - 03/30/18 1035    Patient Stated Goals  to be as independent as he can, to be able dress himself    Currently in Pain?  No/denies          Seated on met reviewed ball exercises for shoulder flexion/ chest press, 10 reps each, min v.c PWR! hands followed by review of strategy for fastening buttons, mod / max difficulty. Checked progress towards goals. Discussion with pt/ wife regarding supervision recommendations.                  OT Education - 03/30/18 1236    Education Details  ways to prevent future complications, recommendation for pt. supervision/ assistance whenever pt is up  walking, recommendation for lifeline, discussed POP support group.    Person(s) Educated  Patient;Spouse    Methods  Explanation;Handout    Comprehension  Verbalized understanding;Returned demonstration;Verbal cues required       OT Short Term Goals - 03/30/18 1231      OT SHORT TERM GOAL #1   Title  Pt/ caregeiver willl be I with PD specific HEP    Status  Achieved   Pt's wife verbalizes understanding     OT SHORT TERM GOAL #2   Title  Pt /wife will verbalize understanding of adapted strategies for ADLS/ IADLs to maximize safety and independence.    Status  Partially Met   Pt and wife were educated in some strategies however due to time demands and pt cognition, decreased carryover of strategies     OT SHORT  TERM GOAL #3   Title  Pt will perfom LB dressing with min A    Status  Not Met   03/15/18:  per pt, wife still providing max A at home, but pt mod A/cues and incr time for shoes/socks in clinic     OT [place] #4   Title  Pt will demonstrate improved fine motor coordination as evidenced by decreasing 9 hole pegs test by 4 secs bilaterally.    Baseline  RUE 68.66 secs, LUE 68.82 secs    Status  Achieved   03/19/18:  R-57.53sec, L-59.75sec     OT SHORT TERM GOAL #5   Title  Pt/ wife will verbalize understanding of compensatory strategies for memory/ cognition    Status  Achieved   03/19/18:  education provided       OT Long Term Goals - 03/30/18 1044      OT LONG TERM GOAL #1   Title  Pt and wife will verbalize understanding of ways to prevent future complications and appropriate community resources    Time  12    Period  Weeks    Status  Achieved      OT LONG TERM GOAL #2   Title  Pt will demonstrate aiblity to retrieve a lightweight object at 115 shoulder flexion and -35 elbow extension with RUE.    Time  12    Period  Weeks    Status  Achieved   03/19/18:  120* with -25* elbow ext     OT LONG TERM GOAL #3   Title  Pt.will demonstrate ability to retrieve a lightweight obect at 120 shoulder flexion with LUE.    Time  12    Period  Weeks    Status  Achieved   03/19/18:  130*     OT LONG TERM GOAL #4   Title  Pt will demonstrate improved gross motor coordination/ bilateral UE functional use for ADLs as evidenced by increasing box/ blocks score bilaterally by 3 blocks    Baseline  RUE 26 blocks, LUe 24 blocks     Time  12    Period  Weeks    Status  Partially Met   RUE 28, LUE 27 met for LUe     OT LONG TERM GOAL #5   Title  Pt will demonstrae ability to fasten 3 buttons in 75 secs or less    Time  12    Period  Weeks    Status  Not Met   2 buttons fastened in 2 mins 30 secs           Plan - 03/30/18 1233  Clinical Impression Statement  Pt  demonstrates overall progress however pt's progress was impacted by cognitive deficits. Therapsit has recommended supervision/ assistance at home whenever pt is up walking due to freezing episodes which place him at risk for falls.    Occupational Profile and client history currently impacting functional performance  Pt was independent with ADLS prior to his triceps tear in February, now he requires assistance with ADLS and has had several falls.FKC:LEXNTZGYF'V disease(diagnosed with parkinsonism 2012)triceps tear Feb 2019,R TKR 2016, L TKR 2017, anxiety, arthritis    Occupational performance deficits (Please refer to evaluation for details):  IADL's;ADL's;Leisure;Social Participation    Rehab Potential  Fair    Current Impairments/barriers affecting progress:  length of time since onset, severity of deficits, cognitive deficits    OT Frequency  2x / week    OT Duration  12 weeks    OT Treatment/Interventions  Self-care/ADL training;Therapeutic exercise;Gait Training;Aquatic Therapy;Moist Heat;Paraffin;Neuromuscular education;Balance training;Patient/family education;Therapeutic activities;Functional Mobility Training;Fluidtherapy;Cryotherapy;Ultrasound;Contrast Bath;DME and/or AE instruction;Manual Therapy;Passive range of motion;Cognitive remediation/compensation    Plan  d/c OT, pt may benefit from evals in 6 mons    OT Home Exercise Plan  Education provided:  Basic PD Coordination HEP, ball ex in seated , PWR! supine- bed mobility    Consulted and Agree with Plan of Care  Patient       Patient will benefit from skilled therapeutic intervention in order to improve the following deficits and impairments:  Abnormal gait, Decreased cognition, Impaired flexibility, Pain, Decreased coordination, Decreased mobility, Decreased strength, Decreased range of motion, Decreased activity tolerance, Decreased balance, Decreased endurance, Decreased knowledge of precautions, Decreased safety awareness, Difficulty  walking, Impaired perceived functional ability, Impaired UE functional use  Visit Diagnosis: Other lack of coordination  Muscle weakness (generalized)  Frontal lobe and executive function deficit  Other symptoms and signs involving the nervous system  Abnormal posture   OCCUPATIONAL THERAPY DISCHARGE SUMMARY    Current functional level related to goals / functional outcomes: Pt made progress towards goals however he did not fully meet all goals due to severity of cognitive deficits.   Remaining deficits: Bradykensia, freezing, decreased functional mobility, decreased balance, decreased coordination, cognitive deficits   Education / Equipment: Pt / wife were education in ADL strategies, HEP, re: community support group, and recommendation for pt supervision with all mobility due to fall risk. Pt/ wife verbalize understanding.Pt is being d/c at this time due to reaching maximal benefit at this time. Plan: Patient agrees to discharge.  Patient goals were not met.                                                                                                      ?????      Problem List Patient Active Problem List   Diagnosis Date Noted  . Rupture of right triceps tendon 05/20/2017  . Triceps tendon rupture, right, initial encounter 05/20/2017  . Atrial fibrillation ([place]) [I48.91] 02/05/2017  . Urinary retention 12/31/2015  . Acute renal failure (ARF) ([place]) 12/31/2015  . Acute encephalopathy 12/31/2015  . Primary osteoarthritis of right knee 07/12/2015  .  Primary osteoarthritis of left knee 11/30/2014  . Encounter for therapeutic drug monitoring 06/21/2013  . Edema of extremities 02/04/2013  . Chronic anticoagulation 02/04/2013  . GERD (gastroesophageal reflux disease)   . Restrictive lung disease   . CKD (chronic kidney disease) stage 2, GFR 60-89 ml/min   . Cellulitis   . Allergic rhinitis   . Anxiety   . Long term (current) use of anticoagulants 01/25/2013  .  Parkinsonism ([place]) 06/22/2012  . Paresthesias 06/22/2012  . PULMONARY NODULE 06/02/2008  . SNORING 06/02/2008  . Hyperlipidemia 05/15/2008  . Essential hypertension 05/15/2008  . PULMONARY FUNCTION TESTS, ABNORMAL 05/15/2008    Caylea Foronda 03/30/2018, 12:37 PM Theone Murdoch, OTR/L [phone] Phone: [phone] 12:46 PM 03/30/18 [facility] [address] [place], [place], [address] Phone: [phone]   Fax:  [phone]  Name: Brian [name] MRN: [id number] Date of Birth: 1940/09/06

## 2018-03-30 NOTE — Therapy (Signed)
[facility] [address] [place], [place], [address] Phone: [phone]   Fax:  [phone]  Physical Therapy Treatment and Discharge Summary  Patient Details  Name: Brian [name] MRN: [id number] Date of Birth: 03-23-1941 Referring Provider (PT): Rexene Alberts   Encounter Date: 03/30/2018  PT End of Session - 03/30/18 0941    Visit Number  15    Number of Visits  18    Date for PT Re-Evaluation  04/21/18    Authorization Type  Medicare and [place] F    PT Start Time  [phone]    PT Stop Time  1015    PT Time Calculation (min)  41 min    Equipment Utilized During Treatment  Gait belt    Activity Tolerance  Patient tolerated treatment well    Behavior During Therapy  [facility] for tasks assessed/performed;Flat affect       Past Medical History:  Diagnosis Date  . Abnormal PFT 1. 05/18/08  2. 11/30/08   1. Showed mild airflow obstruction, mild restriction, mild diffusion defect; FEV1 2.22(64%), FVC 3.33(65%), FEVi% 67, TLC 5.19(69%), DLCO 77%, +BD  2. FEV1 2.38(73%), FVC 3.81(80%), FEV1% 63, TLC 5.61(80%), DLCO 79%, no BD  . Allergic rhinitis   . Anxiety   . BPH (benign prostatic hyperplasia)   . Cellulitis    right leg MRSA  . Erectile dysfunction   . GERD (gastroesophageal reflux disease)   . Heart murmur    mild MR by echo  . Hyperlipidemia   . Hypertension   . Osteoarthritis   . Paresthesias 06/22/2012  . Parkinsonism ([place]) 06/22/2012  . Permanent atrial fibrillation   . Restrictive lung disease     Past Surgical History:  Procedure Laterality Date  . CARDIAC CATHETERIZATION  11/2009   normal coronary arteries  . CARDIOVERSION     multiple  . CATARACT EXTRACTION    . COLONOSCOPY    . COLONOSCOPY WITH PROPOFOL N/A 09/02/2016   Procedure: COLONOSCOPY WITH PROPOFOL;  Surgeon: Garlan Fair, MD;  [facility];  Service: Endoscopy;  Laterality: N/A;  . EYE SURGERY     bilateral cataract extraction  . JOINT  REPLACEMENT    . TOTAL KNEE ARTHROPLASTY Left 11/30/2014   Procedure: TOTAL KNEE ARTHROPLASTY;  Surgeon: Susa Day, MD;  Location: [facility];  Service: Orthopedics;  Laterality: Left;  . TOTAL KNEE ARTHROPLASTY Right 07/12/2015   Procedure: RIGHT TOTAL KNEE ARTHROPLASTY;  Surgeon: Susa Day, MD;  [facility];  Service: Orthopedics;  Laterality: Right;  . TRICEPS TENDON REPAIR Right 05/20/2017  . TRICEPS TENDON REPAIR Right 05/20/2017   Procedure: TRICEPS TENDON REPAIR;  Surgeon: Iran Planas, MD;  Location: [facility];  Service: Orthopedics;  Laterality: Right;    There were no vitals filed for this visit.  Subjective Assessment - 03/30/18 2016    Subjective  Reports he had no injuries from fall outside clinic last visit. Wife stated she could not stay for session today as she had to go to work. Plans to be back in time for Speech therapy session.     Patient is accompained by:  Family member   wife   Pertinent History  Fall February 2019 with tricep tendon tear, s/p 3 months [facility] and then [facility] therapies, L TKR 9/216, R TKR 06/2015    Patient Stated Goals  Pt's goal for PT is to drive car again, to help with balance.    Currently in Pain?  No/denies  [facility] Adult PT Treatment/Exercise - 03/30/18 0959      Transfers   Transfers  Sit to Stand;Stand to Sit    Sit to Stand  4: Min guard;5: Supervision    Sit to Stand Details (indicate cue type and reason)  improved anterior wt-shift however begins to extend upper torso backwards too soon and then loses balance with return to sitting; after vc and facilitation pt abe to correctly perform to static standing with no UE support 3 of 10 trials    Stand to Sit  5: Supervision      Ambulation/Gait   Ambulation/Gait Assistance  4: Min guard    Ambulation Distance (Feet)  60 Feet    Assistive device  Rolling walker    Gait Pattern  Decreased step length - right;Decreased step length - left;Right  flexed knee in stance;Shuffle;Festinating;Trunk flexed;Narrow base of support;Poor foot clearance - right;Decreased hip/knee flexion - right;Decreased hip/knee flexion - left;Right foot flat;Left foot flat      Posture/Postural Control   Posture/Postural Control  Postural limitations    Postural Limitations  Rounded Shoulders;Forward head;Decreased lumbar lordosis;Posterior pelvic tilt    Posture Comments  sitting forward bends to reach to floor for hip flexion/anterior pelvic tilt with return to upright sitting with PT blocking pelvis in neutral and assist with trunk extension and scapular adduction x5 reps       Berg Balance Test   Sit to Stand  Able to stand using hands after several tries    Standing Unsupported  Able to stand 2 minutes with supervision    Sitting with Back Unsupported but Feet Supported on Floor or Stool  Able to sit safely and securely 2 minutes    Stand to Sit  Controls descent by using hands    Transfers  Able to transfer with verbal cueing and /or supervision    Standing Unsupported with Eyes Closed  Able to stand 10 seconds with supervision    Standing Ubsupported with Feet Together  Able to place feet together independently and stand for 1 minute with supervision    From Standing, Reach Forward with Outstretched Arm  Can reach forward >5 cm safely (2")    From Standing Position, Pick up Object from [place] to pick up shoe, needs supervision    From Standing Position, Turn to Look Behind Over each Shoulder  Needs supervision when turning    Turn 360 Degrees  Needs close supervision or verbal cueing   30 seconds   Standing Unsupported, Alternately Place Feet on Step/Stool  Needs assistance to keep from falling or unable to try    Standing Unsupported, One Foot in Front  Able to take small step independently and hold 30 seconds    Standing on One Leg  Unable to try or needs assist to prevent fall    Total Score  29  (<45 high fall risk)            PT  Education - 03/30/18 2024    Education Details  results of final LTGs; need for assist anytime he is up walking and need to remind/ask wife for help when he needs help    Person(s) Educated  Patient    Methods  Explanation    Comprehension  Verbalized understanding       PT Short Term Goals - 03/01/18 1546      PT SHORT TERM GOAL #1   Title  Pt will perform HEP with wife's supervision for improved posture, transfers,  balance, gait.  TARGET 02/26/18    Baseline  Pt able to demo HEP with cues from therapist; at previous visit, wife reports pt is overwhelmed with his exercises-pt reports doing HEP only when he is reminded    Time  5    Period  Weeks    Status  Partially Met      PT SHORT TERM GOAL #2   Title  Pt will perform at least 6 of 10 reps of sit<>stand with minimal UE support for improved transfer efficiency and safety.    Time  5    Period  Weeks    Status  Not Met      PT SHORT TERM GOAL #3   Title  Pt will improve TUG score to less than or equal to 70 seconds for decreased fall risk.    Baseline  61 sec at best with U-step walker    Time  5    Period  Weeks    Status  Achieved      PT SHORT TERM GOAL #4   Title  Pt/wife will demonstrate understanding of tips to reduce freezing with gait and turns.    Time  5    Period  Weeks    Status  Not Met        PT Long Term Goals - 03/30/18 1004      PT LONG TERM GOAL #1   Title  Pt/wife will verbalize understanding of fall prevention to decrease fall risk in home environment.  TARGET 03/30/18    Baseline  Pt/wife have been provided fall prevention education; 03/30/18 pt able to verbalize no loose rugs at home; + grab bars; uses mat for shower    Time  1    Period  Weeks    Status  Achieved      PT LONG TERM GOAL #2   Title  Pt will perform at least 10 reps of sit<>stand with minimal to no UE support, modified independently for improved transfer efficiency and safety.    Baseline  requires UE support, supervision : 03/30/18  required vc and close supervision (no physical assist)    Time  1    Period  Weeks    Status  Partially Met      PT LONG TERM GOAL #3   Title  Pt will improve TUG score to less than or equal to 50 seconds for decreased fall risk.    Baseline  63.32 sec 03/25/18    Time  9    Period  Weeks    Status  Not Met      PT LONG TERM GOAL #4   Title  Pt will improve gait velocity to at least 1.8 ft/sec for decreased fall risk, improved gait efficiency and safety.    Baseline  1.4 ft/sec 03/25/18    Time  9    Period  Weeks    Status  Not Met      PT LONG TERM GOAL #5   Title  Berg score to improve to at least 30/56 for decreased fall risk.    Baseline  23/56 baseline measure of Berg; 03/30/18  29/56    Time  1    Period  Weeks    Status  Partially Met            Plan - 03/30/18 2028    Clinical Impression Statement  Remaining LTGs assessed this date and in total pt met 1 of 5 goals, 2 goals  were partially met (improved just not to goal level), and 2 goals were not met. Overall, his sit to stand transfers have significantly improved and it was determined that pt walks best with his U-step RW, especially if he uses the laser light to create a line for him to "step over." Unfortunately, despite use of assistive device, pt has continued to have falls. On multiple occasions pt/wife have been educated that it is not safe for pt to ambulate on his own with a walker. They have been educated that it is currently not safe for pt to left home alone for any amount of time. On previous visit, wife reported she is looking into hiring an aide assist pt. Patient is discharged from PT with plan to return for PD screen in 3-6 months.     Rehab Potential  Good    Clinical Impairments Affecting Rehab Potential  supportive wife; cognition/severity of PD deficits    PT Frequency  1x / week    PT Duration  Other (comment)   Recert complete for 1 additional visit beyond 03/25/18   PT Treatment/Interventions   ADLs/Self Care Home Management;DME Instruction;Gait training;Stair training;Functional mobility training;Therapeutic activities;Therapeutic exercise;Balance training;Patient/family education;Neuromuscular re-education    Consulted and Agree with Plan of Care  Patient;Family member/caregiver    Family Member Consulted  wife       Patient will benefit from skilled therapeutic intervention in order to improve the following deficits and impairments:  Abnormal gait, Decreased balance, Decreased coordination, Decreased mobility, Decreased safety awareness, Difficulty walking, Decreased strength, Impaired flexibility, Postural dysfunction  Visit Diagnosis: Other abnormalities of gait and mobility  Unsteadiness on feet  Abnormal posture  Other symptoms and signs involving the nervous system     Problem List Patient Active Problem List   Diagnosis Date Noted  . Rupture of right triceps tendon 05/20/2017  . Triceps tendon rupture, right, initial encounter 05/20/2017  . Atrial fibrillation ([place]) [I48.91] 02/05/2017  . Urinary retention 12/31/2015  . Acute renal failure (ARF) ([place]) 12/31/2015  . Acute encephalopathy 12/31/2015  . Primary osteoarthritis of right knee 07/12/2015  . Primary osteoarthritis of left knee 11/30/2014  . Encounter for therapeutic drug monitoring 06/21/2013  . Edema of extremities 02/04/2013  . Chronic anticoagulation 02/04/2013  . GERD (gastroesophageal reflux disease)   . Restrictive lung disease   . CKD (chronic kidney disease) stage 2, GFR 60-89 ml/min   . Cellulitis   . Allergic rhinitis   . Anxiety   . Long term (current) use of anticoagulants 01/25/2013  . Parkinsonism ([place]) 06/22/2012  . Paresthesias 06/22/2012  . PULMONARY NODULE 06/02/2008  . SNORING 06/02/2008  . Hyperlipidemia 05/15/2008  . Essential hypertension 05/15/2008  . PULMONARY FUNCTION TESTS, ABNORMAL 05/15/2008   PHYSICAL THERAPY DISCHARGE SUMMARY  Visits from Start of Care:  15  Current functional level related to goals / functional outcomes: See LTGs above   Remaining deficits: Bradykinesia, rigidity, freezing episodes   Education / Equipment: HEP and need for 24/7 assist at home  Plan: Patient agrees to discharge.  Patient goals were partially met. Patient is being discharged due to lack of progress.  ?????            Rexanne Mano, PT 03/30/2018, 8:36 PM  [facility] [address] [place], [place], [address] Phone: [phone]   Fax:  [phone]  Name: JAISON PETRAGLIA MRN: [id number] Date of Birth: [birth year]

## 2018-03-30 NOTE — Patient Instructions (Signed)
Continue with loud "ah"s.  ====================================== Plan out a prayer for before-Sunday service prayer meeting  Plan points of a conversation with a family member during a family gathering  Plan out a prayer for family mealtime.

## 2018-03-30 NOTE — Patient Instructions (Signed)
Ways to prevent future Parkinson's related complications: 1.   Exercise regularly,  2.   Focus on BIGGER movements during daily activities- really reach overhead, straighten elbows and extend fingers 3.   When dressing or reaching for your seatbelt make sure to use your body to assist by twisting while you reach-this can help to minimize stress on the shoulder and reduce the risk of a rotator cuff tear 4.People with PD are at increased risk for frozen shoulder and moving your arms can reduce this risk.

## 2018-03-30 NOTE — Therapy (Signed)
[facility] [address] [place], [place], [address] Phone: [phone]   Fax:  [phone]  Speech Language Pathology Treatment/Discharge Summary  Patient Details  Name: Brian [name] MRN: [id number] Date of Birth: 10-Mar-1941 Referring Provider (SLP): Star Age, MD   Encounter Date: 03/30/2018  End of Session - 03/30/18 1452    Visit Number  14    Number of Visits  17    Date for SLP Re-Evaluation  04/21/18    SLP Start Time  1105    SLP Stop Time   1145    SLP Time Calculation (min)  40 min    Activity Tolerance  Patient tolerated treatment well       Past Medical History:  Diagnosis Date  . Abnormal PFT 1. 05/18/08  2. 11/30/08   1. Showed mild airflow obstruction, mild restriction, mild diffusion defect; FEV1 2.22(64%), FVC 3.33(65%), FEVi% 67, TLC 5.19(69%), DLCO 77%, +BD  2. FEV1 2.38(73%), FVC 3.81(80%), FEV1% 63, TLC 5.61(80%), DLCO 79%, no BD  . Allergic rhinitis   . Anxiety   . BPH (benign prostatic hyperplasia)   . Cellulitis    right leg MRSA  . Erectile dysfunction   . GERD (gastroesophageal reflux disease)   . Heart murmur    mild MR by echo  . Hyperlipidemia   . Hypertension   . Osteoarthritis   . Paresthesias 06/22/2012  . Parkinsonism ([place]) 06/22/2012  . Permanent atrial fibrillation   . Restrictive lung disease     Past Surgical History:  Procedure Laterality Date  . CARDIAC CATHETERIZATION  11/2009   normal coronary arteries  . CARDIOVERSION     multiple  . CATARACT EXTRACTION    . COLONOSCOPY    . COLONOSCOPY WITH PROPOFOL N/A 09/02/2016   Procedure: COLONOSCOPY WITH PROPOFOL;  Surgeon: Garlan Fair, MD;  Location: [facility];  Service: Endoscopy;  Laterality: N/A;  . EYE SURGERY     bilateral cataract extraction  . JOINT REPLACEMENT    . TOTAL KNEE ARTHROPLASTY Left 11/30/2014   Procedure: TOTAL KNEE ARTHROPLASTY;  Surgeon: Susa Day, MD;  Location: [facility];  Service:  Orthopedics;  Laterality: Left;  . TOTAL KNEE ARTHROPLASTY Right 07/12/2015   Procedure: RIGHT TOTAL KNEE ARTHROPLASTY;  Surgeon: Susa Day, MD;  Location: [facility];  Service: Orthopedics;  Laterality: Right;  . TRICEPS TENDON REPAIR Right 05/20/2017  . TRICEPS TENDON REPAIR Right 05/20/2017   Procedure: TRICEPS TENDON REPAIR;  Surgeon: Iran Planas, MD;  Location: [facility];  Service: Orthopedics;  Laterality: Right;    There were no vitals filed for this visit.  Subjective Assessment - 03/30/18 1114    Subjective  "Well I hope to have progress (after d/c)."    Patient is accompained by:  Family member   wife   Currently in Pain?  No/denies            ADULT SLP TREATMENT - 03/30/18 0001      General Information   Behavior/Cognition  Alert;Cooperative;Pleasant mood;Requires cueing      Treatment Provided   Treatment provided  Cognitive-Linquistic      Cognitive-Linquistic Treatment   Treatment focused on  Apraxia;Dysarthria;Patient/family/caregiver education    Skilled Treatment  Pt quesiton about how to see progress after d/c - SLP enocuraged pt to cont with loud /a/ after d/c and that consistent times each day will be helpful for consistency. Wife suggested after pt's med adminstration - SLP agreed. Pt encouraged wife to assist pt in  remembering to complete loud /a/ and that he needs to take full breath for each rep. Long discussion re: how to modify activities so pt can particpate; which generally included pt requiring more assistance (than now) from wife/family as well as more pre-planning (e.g., pt/family writing out a prayer for pt to say during prayer time prior to Sunday church service). Pt expressed thanks and gratification for speech therapy. Pt loud /a/ otday was average low 90s dB with initial cues for loudness, and consistent cues for full breath.       Assessment / Recommendations / Plan   Plan  --   d/c due to current rehab potential met/pt request     Progression  Toward Goals   Progression toward goals  --   discharge - see goal summary      SLP Education - 03/30/18 1449    Education Details  education for assisting pt in "lifegiving" activities (i.e., prayer in church)    Person(s) Educated  Patient;Spouse    Methods  Explanation;Demonstration    Comprehension  Verbalized understanding;Need further instruction       SLP Short Term Goals - 03/15/18 0950      SLP SHORT TERM GOAL #1   Title  Pt will generate loud /a/ with average of upper 80s dB over 3 sessions    Status  Achieved      SLP SHORT TERM GOAL #2   Title  Pt will use abdominal breathing at rest 85% success over 2 sessions    Status  Not Met      SLP SHORT TERM GOAL #3   Title  Pt will use speech volume average low 70sdB when responding with sentences 18/20 over two sessions    Status  Not Met      SLP SHORT TERM GOAL #4   Title  pt will tell SLP 3 anomia strategies over three sessions    Status  Not Met       SLP Long Term Goals - 03/30/18 1640      SLP LONG TERM GOAL #1   Title  pt will generate loud /a/ average low 90s dB over 6 sessions    Time  --   or 17 visits, for all LTGs   Status  Partially Met      SLP LONG TERM GOAL #2   Title  Pt will use abdominal breathing in sentences or short conversation 80% success over 2 sessions    Status  Partially Met      SLP LONG TERM GOAL #3   Title  pt will use anomia compensations in 75% of opportunities in sentence responses, with min verbal cues to use them, over 3 sessions    Status  Deferred      SLP LONG TERM GOAL #4   Title  pt will undergo objective swallow eval PRN    Status  Deferred       Plan - 03/30/18 1636    Clinical Impression Statement  Pt cont to present with dysarthria including reduced speech volume. Wife present today and receptive to cont'd suggestions from SLP re: assisting pt with anomia and speech loudness.  No overt s/s aspiration PNA today, and no s/sx of aspiration reported at home. Pt may  benefit from a modified barium swallow or FEES should he present with swallowing difficulties in the future. He would benefit from an ST screen in approx 6 months, or ST using home health therapies.     Speech  Therapy Frequency  2x / week    Duration  --   8 weeks or 17 visits   Treatment/Interventions  Aspiration precaution training;Pharyngeal strengthening exercises;Diet toleration management by SLP;Language facilitation;Compensatory techniques;Internal/external aids;SLP instruction and feedback;Multimodal communcation approach;Functional tasks;Patient/family education;Compensatory strategies;Trials of upgraded texture/liquids;Cueing hierarchy;Environmental controls;Cognitive reorganization    Potential to Achieve Goals  Fair    Potential Considerations  Ability to learn/carryover information    Consulted and Agree with Plan of Care  Patient;Family member/caregiver    Family Member Consulted  wife       Patient will benefit from skilled therapeutic intervention in order to improve the following deficits and impairments:   Dysarthria and anarthria   SPEECH THERAPY DISCHARGE SUMMARY  Visits from Start of Care: 14  Current functional level related to goals / functional outcomes: See goal summary. Pt made small gains in loudness in spontaneous conversation. Extensive education for pt/wife on how to assist pt at home with his communication, and how to modify communication situations/environments for incr'd success.   Remaining deficits: Dysarthria due to Parkinson's disease, dysphagia.   Education / Equipment: Loud /a/, see above for education.   Plan: Patient agrees to discharge.  Patient goals were partially met. Patient is being discharged due to the patient's request.  ?????and pt meeting current rehab potential. Pt should be considered for objective swallow test (modified barium swallow or FEES) if he experiences swallowing difficulty. Pt should return in 6 months for PD screen, or  home health therapies could/should be considered.         Problem List Patient Active Problem List   Diagnosis Date Noted  . Rupture of right triceps tendon 05/20/2017  . Triceps tendon rupture, right, initial encounter 05/20/2017  . Atrial fibrillation ([place]) [I48.91] 02/05/2017  . Urinary retention 12/31/2015  . Acute renal failure (ARF) ([place]) 12/31/2015  . Acute encephalopathy 12/31/2015  . Primary osteoarthritis of right knee 07/12/2015  . Primary osteoarthritis of left knee 11/30/2014  . Encounter for therapeutic drug monitoring 06/21/2013  . Edema of extremities 02/04/2013  . Chronic anticoagulation 02/04/2013  . GERD (gastroesophageal reflux disease)   . Restrictive lung disease   . CKD (chronic kidney disease) stage 2, GFR 60-89 ml/min   . Cellulitis   . Allergic rhinitis   . Anxiety   . Long term (current) use of anticoagulants 01/25/2013  . Parkinsonism ([place]) 06/22/2012  . Paresthesias 06/22/2012  . PULMONARY NODULE 06/02/2008  . SNORING 06/02/2008  . Hyperlipidemia 05/15/2008  . Essential hypertension 05/15/2008  . PULMONARY FUNCTION TESTS, ABNORMAL 05/15/2008    [facility] ,MS, CCC-SLP  03/30/2018, 4:42 PM  [facility] [address] [place], [place], [address] Phone: [phone]   Fax:  [phone]   Name: Brian [name] MRN: [id number] Date of Birth: [birth year]

## 2018-04-01 ENCOUNTER — Encounter: Payer: Medicare Other | Admitting: Speech Pathology

## 2018-04-01 ENCOUNTER — Encounter: Payer: Medicare Other | Admitting: Occupational Therapy

## 2018-04-01 ENCOUNTER — Ambulatory Visit: Payer: Medicare Other | Admitting: Physical Therapy

## 2018-04-14 ENCOUNTER — Ambulatory Visit (INDEPENDENT_AMBULATORY_CARE_PROVIDER_SITE_OTHER): Payer: Medicare Other | Admitting: Podiatry

## 2018-04-14 ENCOUNTER — Encounter: Payer: Self-pay | Admitting: Podiatry

## 2018-04-14 DIAGNOSIS — M79676 Pain in unspecified toe(s): Secondary | ICD-10-CM

## 2018-04-14 DIAGNOSIS — D689 Coagulation defect, unspecified: Secondary | ICD-10-CM

## 2018-04-14 DIAGNOSIS — B351 Tinea unguium: Secondary | ICD-10-CM

## 2018-04-14 NOTE — Progress Notes (Signed)
Patient ID: Brian [name], male   DOB: 02-27-1941, 78 y.o.   MRN: [id number] Complaint:  Visit Type: Patient returns to my office for continued preventative foot care services. Complaint: Patient states" my nails have grown long and thick and become painful to walk and wear shoes" . The patient presents for preventative foot care services. No changes to ROS. Patient is taking coumadin.  Podiatric Exam: Vascular: dorsalis pedis and posterior tibial pulses are palpable bilateral. Capillary return is immediate. Temperature gradient is WNL. Skin turgor WNL  Sensorium: Normal Semmes Weinstein monofilament test. Normal tactile sensation bilaterally. Nail Exam: Pt has thick disfigured discolored nails with subungual debris noted bilateral entire nail hallux through fifth toenails Ulcer Exam: There is no evidence of ulcer or pre-ulcerative changes or infection. Orthopedic Exam: Muscle tone and strength are WNL. No limitations in general ROM. No crepitus or effusions noted. Foot type and digits show no abnormalities. Bony prominences are unremarkable. Skin: No Porokeratosis. No infection or ulcers.  Callus posterior aspect heel  B/L.  Diagnosis:  Onychomycosis, , Pain in right toe, pain in left toes,    Treatment & Plan Procedures and Treatment: Consent by patient was obtained for treatment procedures. The patient understood the discussion of treatment and procedures well. All questions were answered thoroughly reviewed. Debridement of mycotic and hypertrophic toenails, 1 through 5 bilateral and clearing of subungual debris. No ulceration, no infection noted..  Debrided heel callus with dremel tool.   Return Visit-Office Procedure: Patient instructed to return to the office for a follow up visit 10 weeks  for continued evaluation and treatment.   Gardiner Barefoot DPM

## 2018-04-20 DIAGNOSIS — R269 Unspecified abnormalities of gait and mobility: Secondary | ICD-10-CM | POA: Diagnosis not present

## 2018-04-20 DIAGNOSIS — N400 Enlarged prostate without lower urinary tract symptoms: Secondary | ICD-10-CM | POA: Diagnosis not present

## 2018-04-20 DIAGNOSIS — E782 Mixed hyperlipidemia: Secondary | ICD-10-CM | POA: Diagnosis not present

## 2018-04-20 DIAGNOSIS — D696 Thrombocytopenia, unspecified: Secondary | ICD-10-CM | POA: Diagnosis not present

## 2018-04-20 DIAGNOSIS — K219 Gastro-esophageal reflux disease without esophagitis: Secondary | ICD-10-CM | POA: Diagnosis not present

## 2018-04-20 DIAGNOSIS — F419 Anxiety disorder, unspecified: Secondary | ICD-10-CM | POA: Diagnosis not present

## 2018-04-20 DIAGNOSIS — I10 Essential (primary) hypertension: Secondary | ICD-10-CM | POA: Diagnosis not present

## 2018-04-20 DIAGNOSIS — Z0000 Encounter for general adult medical examination without abnormal findings: Secondary | ICD-10-CM | POA: Diagnosis not present

## 2018-04-20 DIAGNOSIS — N3281 Overactive bladder: Secondary | ICD-10-CM | POA: Diagnosis not present

## 2018-04-20 DIAGNOSIS — G20 Parkinson's disease: Secondary | ICD-10-CM | POA: Diagnosis not present

## 2018-04-20 DIAGNOSIS — Z23 Encounter for immunization: Secondary | ICD-10-CM | POA: Diagnosis not present

## 2018-04-20 DIAGNOSIS — Z1389 Encounter for screening for other disorder: Secondary | ICD-10-CM | POA: Diagnosis not present

## 2018-04-20 DIAGNOSIS — I519 Heart disease, unspecified: Secondary | ICD-10-CM | POA: Diagnosis not present

## 2018-04-20 DIAGNOSIS — I4821 Permanent atrial fibrillation: Secondary | ICD-10-CM | POA: Diagnosis not present

## 2018-04-22 ENCOUNTER — Ambulatory Visit (INDEPENDENT_AMBULATORY_CARE_PROVIDER_SITE_OTHER): Payer: Medicare Other | Admitting: Pharmacist

## 2018-04-22 ENCOUNTER — Encounter: Payer: Self-pay | Admitting: Neurology

## 2018-04-22 ENCOUNTER — Ambulatory Visit (INDEPENDENT_AMBULATORY_CARE_PROVIDER_SITE_OTHER): Payer: Medicare Other | Admitting: Neurology

## 2018-04-22 VITALS — BP 146/91 | HR 84 | Ht 73.0 in | Wt 225.0 lb

## 2018-04-22 DIAGNOSIS — R269 Unspecified abnormalities of gait and mobility: Secondary | ICD-10-CM | POA: Diagnosis not present

## 2018-04-22 DIAGNOSIS — I4891 Unspecified atrial fibrillation: Secondary | ICD-10-CM | POA: Diagnosis not present

## 2018-04-22 DIAGNOSIS — Z5181 Encounter for therapeutic drug level monitoring: Secondary | ICD-10-CM

## 2018-04-22 DIAGNOSIS — G20 Parkinson's disease: Secondary | ICD-10-CM | POA: Diagnosis not present

## 2018-04-22 DIAGNOSIS — G4752 REM sleep behavior disorder: Secondary | ICD-10-CM | POA: Diagnosis not present

## 2018-04-22 DIAGNOSIS — I4821 Permanent atrial fibrillation: Secondary | ICD-10-CM | POA: Diagnosis not present

## 2018-04-22 DIAGNOSIS — F419 Anxiety disorder, unspecified: Secondary | ICD-10-CM

## 2018-04-22 LAB — POCT INR: INR: 2.2 (ref 2.0–3.0)

## 2018-04-22 MED ORDER — CLONAZEPAM 0.5 MG PO TBDP: 0.5000 mg | ORAL_TABLET | Freq: Every day | ORAL | 3 refills | Status: DC

## 2018-04-22 MED ORDER — CARBIDOPA-LEVODOPA 25-100 MG PO TABS: 2.0000 | ORAL_TABLET | Freq: Every day | ORAL | 3 refills | Status: DC

## 2018-04-22 MED ORDER — RIVASTIGMINE TARTRATE 3 MG PO CAPS: 3.0000 mg | ORAL_CAPSULE | Freq: Two times a day (BID) | ORAL | 3 refills | Status: DC

## 2018-04-22 MED ORDER — CARBIDOPA-LEVODOPA ER 50-200 MG PO TBCR: 2.0000 | EXTENDED_RELEASE_TABLET | Freq: Every day | ORAL | 3 refills | Status: DC

## 2018-04-22 NOTE — Patient Instructions (Signed)
Description   Continue taking 1 tablet daily. Continue eating dark green leafy veggies three times a week. Recheck INR in 6 weeks. Call with any questions or concerns [facility] [phone]

## 2018-04-22 NOTE — Progress Notes (Signed)
Subjective:    Patient ID: Brian [name] is a 78 y.o. male.  HPI     Interim history:   [name] is a 78 year old right-handed gentleman with an underlying medical history of atrial fibrillation, hypertension, OSA, and hyperlipidemia who presents for followup consultation of his right-sided predominant Parkinson's disease complicated by bladder hyperactivity, anxiety, mobility limitations secondary to arthritis with status post bilateral total knee arthroplasties in 2016 and 2017, and RBD. He is accompanied by his wife again today. I last saw him on 12/21/2017, at which time he reported having fallen secondary to right leg giving out. He was having more difficulty walking. He was noted to be more forgetful by his wife. MMSE was 21 at the time and I suggested we start him on Exelon. We kept his other medications the same.  Today, 04/22/2018 (all dictated new, as well as above notes, some dictation done in note pad or Word, outside of chart, may appear as copied):    He reports very little today, his history is primarily provided by his wife. She reports no recent fall but he has overall become slower and unsteady on his feet, tremor. When he is due for his next dose, she notices that he becomes much slower when he is due for his next dose. First dose of his Sinemet is around 7. He does take the CR at bedtime around 9 or so. He is tolerating the Exelon and low dose. Mood fluctuates, generally stable on the low dose of antidepressant. He has been working with PT, OT and speech therapy through neuro rehabilitation, next evaluations pending for July.   The patient's allergies, current medications, family history, past medical history, past social history, past surgical history and problem list were reviewed and updated as appropriate.    Previously (copied from previous notes for reference):    I saw him on 08/10/2013, at which time he was having physical therapy and occupational therapy at home. He  had recently been discharged from inpatient rehabilitation at [facility]. He had triceps tendon repair on the right side in February 2019. I suggested we keep his prescriptions as same including Sinemet, Sinemet CR, Lexapro and clonazepam. I prescribed a U step walker.    I saw him on 05/18/2017, at which time he reported a recent fall with injury to right elbow. He also bumped his head. He fell in the bathroom. He went to the emergency room and I reviewed records at the time. He had a head CT without contrast which showed no acute process, moderate volume loss, moderate chronic small vessel disease. He needed surgery to his right arm under orthopedics.   I saw him on 01/12/2017, at which time he reported to recent falls. He was trying to work out on his own and was supposed to start working with a Physiological scientist.     I saw him on 07/09/2016, at which time he was fairly stable.     I saw him on 03/06/2016 at which time he reported doing better, he had some residual right knee pain. He had an appointment with his orthopedic surgeon. He reported one fall that led to an ER visit via EMS. He also had an admission to the hospital in October 2017 secondary to nausea, vomiting, urinary retention. He was noted to have altered mental status and acute kidney injury. He had a head CT without contrast on 12/31/2015 which showed:    1.  No acute intracranial abnormality. 2. Mild increase in  generalized atrophy from 2013. Chronic small vessel ischemia is stable.   He was in the hospital for 4 days, then at [facility] in rehabilitation for about 10 days. He has ongoing issues with particularly right leg stiffness, no additional falls, does not appear to be very motivated to exercise. He had been in the PD boxing class. I suggested we continue with Sinemet 2 pills 4 times a day, Sinemet CR at bedtime, gabapentin, clonazepam for RBD at night and the only change was an increase in Lexapro from 5 mg to 10 mg  daily.   I saw him on 10/22/2015, at which time he reported worse mobility, more stiffness in the right leg, more freezing episodes, difficulty getting out of bed especially at night when he would have to go to the bathroom. His wife would have to help him. Thankfully, he had no recent falls and was using a cane. He had finished physical therapy was not using his walker. He was not exercising very much to. He was more sedentary. He had right total knee replacement on 07/12/2015. I suggested we increase his Sinemet to 2 pills 4 times a day. We kept his gabapentin the same. We kept the Sinemet CR at bedtime the same.   I saw him on 04/26/2015, at which time he reported taking carbidopa/levodopa 25-100 milligrams strength, about 7 pills per day, 2 for the 1st dose, 1 1/2 pills for the 2nd and 4 th dose, 2 for the 3rd dose, and 1 CR at night. He finished physical therapy and was able to walk without a cane. He reported right knee pain and indicated that he may need right knee surgery as well.   I saw him on 01/18/2015, at which time he reported doing better slowly. He had finished home health physical therapy. He was supposed to start outpatient physical therapy through orthopedics. He had left total knee arthroplasty on 11/30/2014. I reviewed the operative note as well as his discharge summary. He was discharged on 12/03/2014 to inpatient rehabilitation. Of note, he presented to the emergency room on 12/16/2014 secondary to repeated vomiting. This was deemed secondary to constipation. Cardiac enzymes were tested which were negative for any acute coronary syndrome. I reviewed the emergency room records. He had blood work which I reviewed. INR was 2.59. Hemoglobin was slightly low at 11.9 and hematocrit was 34.6. Troponin was negative 2. He overall felt a little better since we increased his Sinemet. He was taking a long-acting Sinemet CR at night. He had some off time in the middle of the night.  He felt he was  making slow progress after his knee replacement surgery. He had some issues with edema. He was wearing compression stockings for this. He was also placed on furosemide for a little while. He was no longer on narcotic pain medications. He was using a cane at the time. I suggested we continue with his medication regimen, he was alternating Sinemet 2 pills with 1-1/2 pills for a total of 4 doses a day.   He missed an appointment on 12/19/2014. I saw him on 08/22/2014, at which time he reported bilateral knee pain. He had seen Dr. Tonita Cong for his knee arthritis and had undergone injections into both knees. He did not think the injections helped a lot. He was exploring knee replacement surgery. He was having more difficulty getting out of chairs. He had no freezing. He did think that the increase in Sinemet had helped in the past. He was alternating 1 pill  with 1-1/2 pills. He was taking Sinemet CR at night. He was on gabapentin 100 mg 3 times a day and Lexapro 5 mg daily as well as clonazepam 0.5 mg each night. He had no new memory or mood issues. He was overall moving slower. He had no recent falls.   In the interim, his wife called on 01/04/2015 reported that he had worsening symptoms. I suggested we increase his Sinemet slightly to 2 tablets alternating with 1-1/2 tablets.   I saw him on 06/01/2014, at which time he reported overall doing well. He was not able to sleep through the night. He was consistently waking up between 3 and 4 AM. He had nocturia twice on average. He felt improved from the bladder medication but still reported disrupted sleep. He felt clonazepam has helped. His wife agreed. He also felt that the increase in Sinemet was helpful in his motor function. It was difficult for him to keep up with the 5 pills a day schedule. He was going to the [facility] 3 times a week for about 30 minutes. Lexapro has helped his mood. His wife felt that he was less anxious. I suggested we increase his clonazepam to 0.5  mg each night. I suggested we change his Sinemet to 1 pill alternating with one half pills for a total of 5 pills daily but for doses. I suggested he continue with Sinemet CR at bedtime.   I saw him on 03/27/2014, at which time he reported sleeping a little bit better but his tremor was worse. He did not feel Sinemet was lasting him 4 hours in between 2 different doses. He was going to the gym twice a week. He was back on an antidepressant. He was supposed to start a new bladder medication. He never actually started clonazepam for RBD. I prescribed clonazepam for him for RBD. I increased his Sinemet to one fill 5 times a day.   I saw him on 12/01/2013, at which time he reported, that he stopped Myrbetric due to new onset facial swelling, but this was no better after stopping it. His facial swelling may have started after the lexapro. Adding long-acting Sinemet helped him sleep a little bit longer up to 3 AM. I suggested he stop Lexapro because of a possible allergic reaction to it. I also added low-dose clonazepam for his RBD and for his sleep.   I saw him on 09/29/2013 at which time he presented for a sooner than scheduled appointment because of increasing tremors and parkinsonian symptoms. He also felt more anxious but not frankly depressed. His wife felt that he may have been depressed. She reported dream enactments. She also reported loud snoring. He had a sleep study over 5 years ago, and that was negative for OSA at the time. He had started Myrbetriq recently. He has been taking gabapentin 3 times a day, and 7, 11 and 3. He takes Sinemet regularly at 7, 11, 3, and 7. I suggested that he start low-dose Lexapro at 5 mg strength. I changed the timing of his gabapentin to one pill at 7 AM, 1 pill at 11 AM and one pill at 7 PM. I asked him to continue with Sinemet 4 times a day. I asked him to add a long-acting Sinemet CR 50/200 mg strength once daily at bed time. I suggested he return for a sleep study. He  had a diagnostic polysomnogram on 11/09/2013 and I went over his test results with him in detail today. Sleep efficiency was reduced  at 58.7% with a latency to sleep of 82 minutes and wake after sleep onset of 117 minutes with mild to moderate sleep fragmentation noted. He had an increased percentage of light stage sleep, absence of deep sleep and 16.8% of dream sleep with a prolonged REM latency. He had mild to moderate snoring. Total AHI was 3.2 per hour, he had some lack of REM atonia.   I saw him on 05/31/2013, at which time I felt he was fairly stable. I kept him on Sinemet 4 times a day and gabapentin 100 mg 3 times a day. He reported, going up to 4 times a day with his Sinemet helped. He reported no side effects with gabapentin or Sinemet and seemed to tolerate them well. He had some residual facial tingling which was tolerable to him. He has LBP and went to [facility]. He had an Xray of the back and was told there was degenerative disease and was given and was given a 3 week taper of oral steroids, which helped. He had stopped exercising d/t back pain and was scheduled for PT evaluation through ortho. He has been on metamucil for constipation which helped.   I saw him on 11/30/2012, at which time I suggested that he continue gabapentin 100 mg 1 pill 3 times a day for his paresthesias and encouraged him to continue Sinemet with 25/100 mg strength one tablet 4 times a day, at 7, 11, 3 PM and 7 PM.     I first met him on 06/22/2012 and he presented on 08/23/2012 for a sooner than scheduled appointment because he felt his medication was not helpful. He previously followed with Dr. Morene Antu and had been complaining of paresthesias. Dr. Erling Cruz had started him on low-dose gabapentin. For his parkinsonism which was first noted in June of 2012 he was tried on pramipexole which helped, but at his first visit with me he told me that his gabapentin was not helpful and he also discontinued pramipexole a few  months prior because of swelling in his feet and ankles. This improved after stopping both medications. At the time of his first visit with me I felt he had mild parkinsonism without much in the way of lateralization. I started him on gabapentin again because of his paresthesias and also asked him to start low-dose Sinemet. He called back stating that the Sinemet was not helpful. He requested a sooner appointment. He had been continuing taking gabapentin 100 mg 3 times a day and Sinemet one pill 3 times a day with minimal improvement as he reported last time. I suggested at that visit that he increase his Sinemet to one pill 4 times a day and continue with gabapentin 100 mg 3 times a day. I also felt that he may have right sided predominant idiopathic Parkinson's disease due to an intermittent tremor noted only on the right side.    His Past Medical History Is Significant For: Past Medical History:  Diagnosis Date  . Abnormal PFT 1. 05/18/08  2. 11/30/08   1. Showed mild airflow obstruction, mild restriction, mild diffusion defect; FEV1 2.22(64%), FVC 3.33(65%), FEVi% 67, TLC 5.19(69%), DLCO 77%, +BD  2. FEV1 2.38(73%), FVC 3.81(80%), FEV1% 63, TLC 5.61(80%), DLCO 79%, no BD  . Allergic rhinitis   . Anxiety   . BPH (benign prostatic hyperplasia)   . Cellulitis    right leg MRSA  . Erectile dysfunction   . GERD (gastroesophageal reflux disease)   . Heart murmur    mild  MR by echo  . Hyperlipidemia   . Hypertension   . Osteoarthritis   . Paresthesias 06/22/2012  . Parkinsonism ([place]) 06/22/2012  . Permanent atrial fibrillation   . Restrictive lung disease     His Past Surgical History Is Significant For: Past Surgical History:  Procedure Laterality Date  . CARDIAC CATHETERIZATION  11/2009   normal coronary arteries  . CARDIOVERSION     multiple  . CATARACT EXTRACTION    . COLONOSCOPY    . COLONOSCOPY WITH PROPOFOL N/A 09/02/2016   Procedure: COLONOSCOPY WITH PROPOFOL;  Surgeon: Garlan Fair, MD;  Location: [facility];  Service: Endoscopy;  Laterality: N/A;  . EYE SURGERY     bilateral cataract extraction  . JOINT REPLACEMENT    . TOTAL KNEE ARTHROPLASTY Left 11/30/2014   Procedure: TOTAL KNEE ARTHROPLASTY;  Surgeon: Susa Day, MD;  Location: [facility];  Service: Orthopedics;  Laterality: Left;  . TOTAL KNEE ARTHROPLASTY Right 07/12/2015   Procedure: RIGHT TOTAL KNEE ARTHROPLASTY;  Surgeon: Susa Day, MD;  Location: [facility];  Service: Orthopedics;  Laterality: Right;  . TRICEPS TENDON REPAIR Right 05/20/2017  . TRICEPS TENDON REPAIR Right 05/20/2017   Procedure: TRICEPS TENDON REPAIR;  Surgeon: Iran Planas, MD;  Location: [facility];  Service: Orthopedics;  Laterality: Right;    His Family History Is Significant For: Family History  Problem Relation Age of Onset  . Diabetes Mother        DM  . Stroke Father        CVA  . Pancreatic cancer Brother   . Pancreatic cancer Other        Nephew    His Social History Is Significant For: Social History   Socioeconomic History  . Marital status: Married    Spouse name: [name]  . Number of children: 4  . Years of education: masters  . Highest education level: Not on file  Occupational History  . Occupation: Chief of Staff    Comment: Worked with [employer]  . Financial resource strain: Not on file  . Food insecurity:    Worry: Not on file    Inability: Not on file  . Transportation needs:    Medical: Not on file    Non-medical: Not on file  Tobacco Use  . Smoking status: Never Smoker  . Smokeless tobacco: Never Used  Substance and Sexual Activity  . Alcohol use: No    Alcohol/week: 0.0 standard drinks  . Drug use: No  . Sexual activity: Not on file  Lifestyle  . Physical activity:    Days per week: Not on file    Minutes per session: Not on file  . Stress: Not on file  Relationships  . Social connections:    Talks on phone: Not on file    Gets together: Not on file    Attends  religious service: Not on file    Active member of club or organization: Not on file    Attends meetings of clubs or organizations: Not on file    Relationship status: Not on file  Other Topics Concern  . Not on file  Social History Narrative   Married and lives in [place].  Retired Optometrist.   No reported caffeine use.     His Allergies Are:  Allergies  Allergen Reactions  . Lisinopril Cough  :   His Current Medications Are:  Outpatient Encounter Medications as of 04/22/2018  Medication Sig  . ammonium lactate (LAC-HYDRIN) 12 %  cream Apply topically as needed for dry skin. As prescribed (Patient taking differently: Apply 1 g topically as needed for dry skin. As prescribed)  . carbidopa-levodopa (SINEMET CR) 50-200 MG tablet Take 1 tablet by mouth at bedtime.  . carbidopa-levodopa (SINEMET IR) 25-100 MG tablet Take 2 tablets by mouth 4 (four) times daily.  . cholecalciferol (VITAMIN D) 1000 units tablet Take 1,000 Units by mouth daily. 7 am  . clonazePAM (KLONOPIN) 0.5 MG disintegrating tablet DISSOLVE 1 TABLET BY MOUTH  AT BEDTIME  . doxazosin (CARDURA) 2 MG tablet Take 2 mg by mouth at bedtime.   Marland Kitchen escitalopram (LEXAPRO) 10 MG tablet Take 1 tablet (10 mg total) by mouth at bedtime.  . famotidine (PEPCID) 40 MG tablet Take 40 mg by mouth every evening. 7 pm  . finasteride (PROSCAR) 5 MG tablet Take 5 mg by mouth daily.  Marland Kitchen FLUAD 0.5 ML SUSY ADM 0.5ML IM UTD  . furosemide (LASIX) 40 MG tablet Take 40 mg by mouth 2 (two) times daily.  Marland Kitchen gabapentin (NEURONTIN) 100 MG capsule Take 1 capsule (100 mg total) by mouth 3 (three) times daily.  Marland Kitchen losartan (COZAAR) 50 MG tablet Take 50 mg by mouth every morning.  . metoprolol succinate (TOPROL-XL) 25 MG 24 hr tablet Take 25 mg by mouth every evening. 7 pm  . Multiple Vitamin (MULTIVITAMIN WITH MINERALS) TABS tablet Take 1 tablet by mouth daily. 7 am  . potassium chloride (K-DUR,KLOR-CON) 10 MEQ tablet Take 10 mEq by mouth 2 (two) times  daily.   . pravastatin (PRAVACHOL) 40 MG tablet Take 40 mg by mouth every morning.   . rivastigmine (EXELON) 1.5 MG capsule Take 1 capsule (1.5 mg total) by mouth 2 (two) times daily.  . sodium chloride (OCEAN) 0.65 % SOLN nasal spray Place 1 spray into both nostrils daily as needed for congestion.  . tamsulosin (FLOMAX) 0.4 MG CAPS capsule Take 0.4 mg at bedtime by mouth.  Marland Kitchen UNABLE TO FIND Take by mouth.  . vitamin C (ASCORBIC ACID) 500 MG tablet Take 500 mg by mouth every morning.  . warfarin (COUMADIN) 5 MG tablet TAKE AS DIRECTED BY MOUTH BY COUMADIN CLINIC   No facility-administered encounter medications on file as of 04/22/2018.   :  Review of Systems:  Out of a complete 14 point review of systems, all are reviewed and negative with the exception of these symptoms as listed below:  Review of Systems  Neurological:       Pt presents today to discuss his PD. Pt reports that his shuffling and freezing have increased. Pt has been working with PT.    Objective:  Neurological Exam  Physical Exam Physical Examination:   Vitals:   04/22/18 1055  BP: (!) 146/91  Pulse: 84    General Examination: The patient is a very pleasant 78 y.o. male in no acute distress. He appears well-developed and well-nourished and well groomed.   HEENT:Normocephalic, atraumatic, pupils are equal, round and reactive to light and accommodation. s/p cataract repairs. Extraocular tracking shows moderatesaccadic breakdown.Moderate to severe nuclear rigidity is noted. No lip, neck or jaw tremor is noted.He has mod hypophonia, mild dysarthria noted. More anterocollis. Oropharynx examination reveals no new findings, except moderate mouth dryness. Tongue is central, palate symmetrical. Mild sialorrhea.   Chest:Clear to auscultation without wheezing, rhonchi or crackles noted.  Heart:S1+S2+0, regular and normal without murmurs, rubs or gallops noted.   Abdomen:Soft, non-tender and  non-distended.  Extremities:There isnoedema in the distal lower extremities bilaterally.  Skin: Warm and dry without trophic changes noted.  Musculoskeletal: exam reveals no obvious joint deformities, tenderness or joint swelling or erythema, healing/healedscars from TKAs b/l.no obvious swelling, no pain, slight decrease in leg extension range of motion bilaterally.   Neurologically:  Mental status: The patient is awake, alert and oriented in all 4 spheres. Hisimmediate and remote memory, attention, language skills and fund of knowledge are mildly impaired with some slowness in thinking. Mood is normaland affect is normal.   On 12/21/2017: MMSE: 21/28, CDT: could not draw. AFT: 11/min.  On 04/22/2018: MMSE: 23/28, cannot draw, CDT: Cannot draw a clock, AFT: 8/min.  Cranial nerves II - XII are as described above under HEENT exam. Motor exam: Normal bulk,globalstrengthof 4+/5, No restingtremor or rebound. Romberg is not tested for safety reasons.Reflexes arenot tested today. Fine motor exam shows severe impairment on the right, moderate impairment on the left.severe slowness noted today. No obvious resting tremor. Cerebellar testing: No dysmetria or intention tremor.  Sensory exam: intact to light touch in the upper and lower extremities.  Gait, station and balance:he stands up with significant difficulty and has to push himself up. Requires no actual assistance but very slow today. He walks slowly with his 2 wheeled walker, turns in multiple steps, balance is impaired. Significant decrease in stride length and decrease in pace. Stutter steps with right foot, some freezing.  Assessment and Plan:   In summary, DOMONIC KIMBALL a very pleasant 78 year old male with a history of atrial fibrillation, hypertension, hyperlipidemia, who presents for followup consultation of his right-sided predominant, Parkinson's disease, akinetic-rigid type, complicated by anxiety, mild  depression, sleep disorder, (including sleep fragmentation, nocturia, RBD, insomnia), obesity, arthritis, with s/p left (2016), then right (2017)total knee replacement surgeriesand recurrent falls, memory loss and visual hallucinations - these have been stable - and overall progression with time. He has had falls with injuries, sustained right triceps tendon injury in February 2019, requiring surgery and prolonged rehabilitation. He has had progression with time, he has had anxiety and depression and memory loss. He has been on low-dose Lexapro. For his memory, we started him on low-dose generic Exelon 1.5 mg twice a day in September 2019, we mutually agreed to increase it to 3 mg twice a day at this time. He has benefited from outpatient therapies and would be due for reevaluation as in July 2020. I also suggested we increase his Sinemet to 2 pills 5 times a day, every 3 hours starting at 7 AM and also consider increasing his Sinemet CR to 2 pills at bedtime. He will continue with clonazepam low-dose at night and gabapentin 3 times a day. Of noted, he has a U-step walker at home. We increased the lexapro generic to 10 mg in Dec. 2017.I suggested a 3 month checkup, sooner if needed. I answered all their questions today and the patient and his wife were in agreement.  I spent 30 minutes in total face-to-face time with the patient, more than 50% of which was spent in counseling and coordination of care, reviewing test results, reviewing medication and discussing or reviewing the diagnosis of PD, its prognosis and treatment options. Pertinent laboratory and imaging test results that were available during this visit with the patient were reviewed by me and considered in my medical decision making (see chart for details).

## 2018-04-22 NOTE — Patient Instructions (Addendum)
We will increase your Sinemet to 2 pills 5 times a day; at 7, 10, 1 PM, 4 PM, and 7 PM.  We will increase your Sinemet CR to 2 pills at bedtime, maybe delay starting this by about 2 weeks.  We are going up to the next dose for your memory medication: generic Exelon to 3 mg 2 times a day.  Follow up in 3 months, memory scores are stable.

## 2018-05-31 ENCOUNTER — Ambulatory Visit (INDEPENDENT_AMBULATORY_CARE_PROVIDER_SITE_OTHER): Payer: Medicare Other | Admitting: Pharmacist

## 2018-05-31 DIAGNOSIS — Z5181 Encounter for therapeutic drug level monitoring: Secondary | ICD-10-CM

## 2018-05-31 DIAGNOSIS — I4821 Permanent atrial fibrillation: Secondary | ICD-10-CM

## 2018-05-31 DIAGNOSIS — I4891 Unspecified atrial fibrillation: Secondary | ICD-10-CM

## 2018-05-31 LAB — POCT INR: INR: 3.4 — AB (ref 2.0–3.0)

## 2018-05-31 NOTE — Patient Instructions (Signed)
Description   No warfarin today then continue taking 1 tablet daily. Continue eating dark green leafy veggies three times a week. Recheck INR in 4 weeks. Call with any questions or concerns Coumadin Clinic [phone]

## 2018-06-03 ENCOUNTER — Encounter: Payer: Self-pay | Admitting: Neurology

## 2018-06-05 ENCOUNTER — Other Ambulatory Visit: Payer: Self-pay | Admitting: Cardiology

## 2018-06-24 ENCOUNTER — Telehealth: Payer: Self-pay | Admitting: Pharmacist

## 2018-06-24 ENCOUNTER — Telehealth: Payer: Self-pay

## 2018-06-24 NOTE — Telephone Encounter (Signed)
lmom for prescreen/drive up 

## 2018-06-24 NOTE — Telephone Encounter (Signed)

## 2018-06-25 ENCOUNTER — Ambulatory Visit: Payer: Medicare Other | Admitting: Podiatry

## 2018-06-28 ENCOUNTER — Ambulatory Visit (INDEPENDENT_AMBULATORY_CARE_PROVIDER_SITE_OTHER): Payer: Medicare Other | Admitting: Pharmacist

## 2018-06-28 ENCOUNTER — Other Ambulatory Visit: Payer: Self-pay

## 2018-06-28 DIAGNOSIS — I4891 Unspecified atrial fibrillation: Secondary | ICD-10-CM

## 2018-06-28 DIAGNOSIS — I4821 Permanent atrial fibrillation: Secondary | ICD-10-CM | POA: Diagnosis not present

## 2018-06-28 DIAGNOSIS — Z5181 Encounter for therapeutic drug level monitoring: Secondary | ICD-10-CM | POA: Diagnosis not present

## 2018-06-28 LAB — POCT INR: INR: 3 (ref 2.0–3.0)

## 2018-06-30 ENCOUNTER — Telehealth: Payer: Self-pay | Admitting: Neurology

## 2018-06-30 NOTE — Telephone Encounter (Signed)
I called pt, spoke to pt's wife. Pt's meds, allergies, and PMH were updated.  Pt reports that pt's last weight was with [place] in January and he was 225 lbs and is 6'1.  Pt's MMSE was done at his last visit in January and he scored 23/28, cannot draw, CDT: Cannot draw a clock, AFT: 8/min.  Pt's wife confirmed that she received the email with instructions on how to download the software and will complete that prior to pt's appt next week.

## 2018-06-30 NOTE — Telephone Encounter (Signed)
Due to current COVID 19 pandemic, our office is severely reducing in office visits for at least the next 2 weeks, in order to minimize the risk to our patients and healthcare providers.   Called patient and offered to move appointment to an earlier date, via virtual visit with Dr. Rexene Alberts. I spoke with patient's wife, [name]. She agreed and gave consent to virtual visit on patient's behalf. Patient was scheduled for 07/06/18. I explained to her the process of setting up a Webex meeting. She verbalized understanding. I advised that she will be receiving an e-mail from our office containing further instruction, as well as the link needed to join the meeting. Patient was informed she will be receiving a call from the nurse prior to appointment to go over patient history.  Pt understands that although there may be some limitations with this type of visit, we will take all precautions to reduce any security or privacy concerns.  Pt understands that this will be treated like an in office visit and we will file with pt's insurance, and there may be a patient responsible charge related to this service.  Pt's email is [email or web address]. Pt understands that the [employer] webex software must be downloaded and operational on the device pt plans to use for the visit.

## 2018-07-02 MED ORDER — APIXABAN 5 MG PO TABS: 5.0000 mg | ORAL_TABLET | Freq: Two times a day (BID) | ORAL | 5 refills | Status: DC

## 2018-07-02 MED ORDER — APIXABAN 5 MG PO TABS: 5.0000 mg | ORAL_TABLET | Freq: Two times a day (BID) | ORAL | 1 refills | Status: DC

## 2018-07-06 ENCOUNTER — Ambulatory Visit (INDEPENDENT_AMBULATORY_CARE_PROVIDER_SITE_OTHER): Payer: Medicare Other | Admitting: Neurology

## 2018-07-06 ENCOUNTER — Other Ambulatory Visit: Payer: Self-pay

## 2018-07-06 ENCOUNTER — Encounter: Payer: Self-pay | Admitting: Neurology

## 2018-07-06 DIAGNOSIS — G4752 REM sleep behavior disorder: Secondary | ICD-10-CM | POA: Diagnosis not present

## 2018-07-06 DIAGNOSIS — G20 Parkinson's disease: Secondary | ICD-10-CM

## 2018-07-06 DIAGNOSIS — R269 Unspecified abnormalities of gait and mobility: Secondary | ICD-10-CM

## 2018-07-06 DIAGNOSIS — R413 Other amnesia: Secondary | ICD-10-CM

## 2018-07-06 DIAGNOSIS — F419 Anxiety disorder, unspecified: Secondary | ICD-10-CM | POA: Diagnosis not present

## 2018-07-06 DIAGNOSIS — F418 Other specified anxiety disorders: Secondary | ICD-10-CM

## 2018-07-06 MED ORDER — ESCITALOPRAM OXALATE 10 MG PO TABS: 10.0000 mg | ORAL_TABLET | Freq: Every day | ORAL | 3 refills | Status: DC

## 2018-07-06 MED ORDER — CARBIDOPA-LEVODOPA 25-100 MG PO TABS: ORAL_TABLET | ORAL | 3 refills | Status: DC

## 2018-07-06 NOTE — Progress Notes (Signed)
Interim history:  Mr. Beyene is a 78 year old right-handed gentleman with an underlying medical history of atrial fibrillation, hypertension, OSA, arthritis with bilateral total knee arthroplasties in 2016 and 2017 respectively, and hyperlipidemia who presents for a virtual, video based followup consultation of his right-sided predominant Parkinson's disease, complicated by bladder hyperactivity, anxiety, mobility limitations from arthritis, hallucinations, and overall progression with time resulting in debility. He is accompanied by his wife today and joins from home. I last saw him on 04/22/2018, at which time he was noted to be more slower and more unsteady. I suggested we increase his Sinemet to from 2 pills 4 times a day to 2 pills 5 times a day. I also suggested we increase his Exelon from 1.5 mg twice a day to 3 mg twice a day.  His wife called in the interim in March 2020, reporting that he was much more lethargic. She was advised to take him to primary care to get checked out for acute medical problems such as urinary tract infection, dehydration, electrolyte disturbance, upper respiratory infection etc.  Today, 07/06/2018 (all dictated new, as well as above notes, some dictation done in note pad or Word, outside of chart, may appear as copied):    The patient's allergies, current medications, family history, past medical history, past social history, past surgical history and problem list were reviewed and updated as appropriate.    Previously (copied from previous notes for reference):   I saw him on 12/21/2017, at which time he reported having fallen secondary to right leg giving out. He was having more difficulty walking. He was noted to be more forgetful by his wife. MMSE was 21 at the time and I suggested we start him on Exelon. We kept his other medications the same.    I saw him on 08/10/2013, at which time he was having physical therapy and occupational therapy at home. He had  recently been discharged from inpatient rehabilitation at [facility]. He had triceps tendon repair on the right side in February 2019. I suggested we keep his prescriptions as same including Sinemet, Sinemet CR, Lexapro and clonazepam. I prescribed a U step walker.    I saw him on 05/18/2017, at which time he reported a recent fall with injury to right elbow. He also bumped his head. He fell in the bathroom. He went to the emergency room and I reviewed records at the time. He had a head CT without contrast which showed no acute process, moderate volume loss, moderate chronic small vessel disease. He needed surgery to his right arm under orthopedics.   I saw him on 01/12/2017, at which time he reported to recent falls. He was trying to work out on his own and was supposed to start working with a Physiological scientist.     I saw him on 07/09/2016, at which time he was fairly stable.     I saw him on 03/06/2016 at which time he reported doing better, he had some residual right knee pain. He had an appointment with his orthopedic surgeon. He reported one fall that led to an ER visit via EMS. He also had an admission to the hospital in October 2017 secondary to nausea, vomiting, urinary retention. He was noted to have altered mental status and acute kidney injury. He had a head CT without contrast on 12/31/2015 which showed:    1.  No acute intracranial abnormality. 2. Mild increase in generalized atrophy from 2013. Chronic small vessel ischemia is stable.  He was in the hospital for 4 days, then at [facility] in rehabilitation for about 10 days. He has ongoing issues with particularly right leg stiffness, no additional falls, does not appear to be very motivated to exercise. He had been in the [employer] boxing class. I suggested we continue with Sinemet 2 pills 4 times a day, Sinemet CR at bedtime, gabapentin, clonazepam for RBD at night and the only change was an increase in Lexapro from 5 mg to 10 mg daily.     I saw him on 10/22/2015, at which time he reported worse mobility, more stiffness in the right leg, more freezing episodes, difficulty getting out of bed especially at night when he would have to go to the bathroom. His wife would have to help him. Thankfully, he had no recent falls and was using a cane. He had finished physical therapy was not using his walker. He was not exercising very much to. He was more sedentary. He had right total knee replacement on 07/12/2015. I suggested we increase his Sinemet to 2 pills 4 times a day. We kept his gabapentin the same. We kept the Sinemet CR at bedtime the same.   I saw him on 04/26/2015, at which time he reported taking carbidopa/levodopa 25-100 milligrams strength, about 7 pills per day, 2 for the 1st dose, 1 1/2 pills for the 2nd and 4 th dose, 2 for the 3rd dose, and 1 CR at night. He finished physical therapy and was able to walk without a cane. He reported right knee pain and indicated that he may need right knee surgery as well.   I saw him on 01/18/2015, at which time he reported doing better slowly. He had finished home health physical therapy. He was supposed to start outpatient physical therapy through orthopedics. He had left total knee arthroplasty on 11/30/2014. I reviewed the operative note as well as his discharge summary. He was discharged on 12/03/2014 to inpatient rehabilitation. Of note, he presented to the emergency room on 12/16/2014 secondary to repeated vomiting. This was deemed secondary to constipation. Cardiac enzymes were tested which were negative for any acute coronary syndrome. I reviewed the emergency room records. He had blood work which I reviewed. INR was 2.59. Hemoglobin was slightly low at 11.9 and hematocrit was 34.6. Troponin was negative 2. He overall felt a little better since we increased his Sinemet. He was taking a long-acting Sinemet CR at night. He had some off time in the middle of the night.  He felt he was making  slow progress after his knee replacement surgery. He had some issues with edema. He was wearing compression stockings for this. He was also placed on furosemide for a little while. He was no longer on narcotic pain medications. He was using a cane at the time. I suggested we continue with his medication regimen, he was alternating Sinemet 2 pills with 1-1/2 pills for a total of 4 doses a day.   He missed an appointment on 12/19/2014. I saw him on 08/22/2014, at which time he reported bilateral knee pain. He had seen Dr. Tonita Cong for his knee arthritis and had undergone injections into both knees. He did not think the injections helped a lot. He was exploring knee replacement surgery. He was having more difficulty getting out of chairs. He had no freezing. He did think that the increase in Sinemet had helped in the past. He was alternating 1 pill with 1-1/2 pills. He was taking Sinemet CR at night. He  was on gabapentin 100 mg 3 times a day and Lexapro 5 mg daily as well as clonazepam 0.5 mg each night. He had no new memory or mood issues. He was overall moving slower. He had no recent falls.   In the interim, his wife called on 01/04/2015 reported that he had worsening symptoms. I suggested we increase his Sinemet slightly to 2 tablets alternating with 1-1/2 tablets.   I saw him on 06/01/2014, at which time he reported overall doing well. He was not able to sleep through the night. He was consistently waking up between 3 and 4 AM. He had nocturia twice on average. He felt improved from the bladder medication but still reported disrupted sleep. He felt clonazepam has helped. His wife agreed. He also felt that the increase in Sinemet was helpful in his motor function. It was difficult for him to keep up with the 5 pills a day schedule. He was going to the [facility] 3 times a week for about 30 minutes. Lexapro has helped his mood. His wife felt that he was less anxious. I suggested we increase his clonazepam to 0.5 mg  each night. I suggested we change his Sinemet to 1 pill alternating with one half pills for a total of 5 pills daily but for doses. I suggested he continue with Sinemet CR at bedtime.   I saw him on 03/27/2014, at which time he reported sleeping a little bit better but his tremor was worse. He did not feel Sinemet was lasting him 4 hours in between 2 different doses. He was going to the gym twice a week. He was back on an antidepressant. He was supposed to start a new bladder medication. He never actually started clonazepam for RBD. I prescribed clonazepam for him for RBD. I increased his Sinemet to one fill 5 times a day.   I saw him on 12/01/2013, at which time he reported, that he stopped Myrbetric due to new onset facial swelling, but this was no better after stopping it. His facial swelling may have started after the lexapro. Adding long-acting Sinemet helped him sleep a little bit longer up to 3 AM. I suggested he stop Lexapro because of a possible allergic reaction to it. I also added low-dose clonazepam for his RBD and for his sleep.   I saw him on 09/29/2013 at which time he presented for a sooner than scheduled appointment because of increasing tremors and parkinsonian symptoms. He also felt more anxious but not frankly depressed. His wife felt that he may have been depressed. She reported dream enactments. She also reported loud snoring. He had a sleep study over 5 years ago, and that was negative for OSA at the time. He had started Myrbetriq recently. He has been taking gabapentin 3 times a day, and 7, 11 and 3. He takes Sinemet regularly at 7, 11, 3, and 7. I suggested that he start low-dose Lexapro at 5 mg strength. I changed the timing of his gabapentin to one pill at 7 AM, 1 pill at 11 AM and one pill at 7 PM. I asked him to continue with Sinemet 4 times a day. I asked him to add a long-acting Sinemet CR 50/200 mg strength once daily at bed time. I suggested he return for a sleep study. He had  a diagnostic polysomnogram on 11/09/2013 and I went over his test results with him in detail today. Sleep efficiency was reduced at 58.7% with a latency to sleep of 82 minutes and  wake after sleep onset of 117 minutes with mild to moderate sleep fragmentation noted. He had an increased percentage of light stage sleep, absence of deep sleep and 16.8% of dream sleep with a prolonged REM latency. He had mild to moderate snoring. Total AHI was 3.2 per hour, he had some lack of REM atonia.   I saw him on 05/31/2013, at which time I felt he was fairly stable. I kept him on Sinemet 4 times a day and gabapentin 100 mg 3 times a day. He reported, going up to 4 times a day with his Sinemet helped. He reported no side effects with gabapentin or Sinemet and seemed to tolerate them well. He had some residual facial tingling which was tolerable to him. He has LBP and went to [facility]. He had an Xray of the back and was told there was degenerative disease and was given and was given a 3 week taper of oral steroids, which helped. He had stopped exercising d/t back pain and was scheduled for PT evaluation through ortho. He has been on metamucil for constipation which helped.   I saw him on 11/30/2012, at which time I suggested that he continue gabapentin 100 mg 1 pill 3 times a day for his paresthesias and encouraged him to continue Sinemet with 25/100 mg strength one tablet 4 times a day, at 7, 11, 3 PM and 7 PM.     I first met him on 06/22/2012 and he presented on 08/23/2012 for a sooner than scheduled appointment because he felt his medication was not helpful. He previously followed with Dr. Morene Antu and had been complaining of paresthesias. Dr. Erling Cruz had started him on low-dose gabapentin. For his parkinsonism which was first noted in June of 2012 he was tried on pramipexole which helped, but at his first visit with me he told me that his gabapentin was not helpful and he also discontinued pramipexole a few months  prior because of swelling in his feet and ankles. This improved after stopping both medications. At the time of his first visit with me I felt he had mild parkinsonism without much in the way of lateralization. I started him on gabapentin again because of his paresthesias and also asked him to start low-dose Sinemet. He called back stating that the Sinemet was not helpful. He requested a sooner appointment. He had been continuing taking gabapentin 100 mg 3 times a day and Sinemet one pill 3 times a day with minimal improvement as he reported last time. I suggested at that visit that he increase his Sinemet to one pill 4 times a day and continue with gabapentin 100 mg 3 times a day. I also felt that he may have right sided predominant idiopathic Parkinson's disease due to an intermittent tremor noted only on the right side.    His Past Medical History Is Significant For: Past Medical History:  Diagnosis Date   Abnormal PFT 1. 05/18/08  2. 11/30/08   1. Showed mild airflow obstruction, mild restriction, mild diffusion defect; FEV1 2.22(64%), FVC 3.33(65%), FEVi% 67, TLC 5.19(69%), DLCO 77%, +BD  2. FEV1 2.38(73%), FVC 3.81(80%), FEV1% 63, TLC 5.61(80%), DLCO 79%, no BD   Allergic rhinitis    Anxiety    BPH (benign prostatic hyperplasia)    Cellulitis    right leg MRSA   Erectile dysfunction    GERD (gastroesophageal reflux disease)    Heart murmur    mild MR by echo   Hyperlipidemia    Hypertension  Osteoarthritis    Paresthesias 06/22/2012   Parkinsonism ([place]) 06/22/2012   Permanent atrial fibrillation    Restrictive lung disease     His Past Surgical History Is Significant For: Past Surgical History:  Procedure Laterality Date   CARDIAC CATHETERIZATION  11/2009   normal coronary arteries   CARDIOVERSION     multiple   CATARACT EXTRACTION     COLONOSCOPY     COLONOSCOPY WITH PROPOFOL N/A 09/02/2016   Procedure: COLONOSCOPY WITH PROPOFOL;  Surgeon: Garlan Fair, MD;   Location: [facility];  Service: Endoscopy;  Laterality: N/A;   EYE SURGERY     bilateral cataract extraction   JOINT REPLACEMENT     TOTAL KNEE ARTHROPLASTY Left 11/30/2014   Procedure: TOTAL KNEE ARTHROPLASTY;  Surgeon: Susa Day, MD;  Location: [facility];  Service: Orthopedics;  Laterality: Left;   TOTAL KNEE ARTHROPLASTY Right 07/12/2015   Procedure: RIGHT TOTAL KNEE ARTHROPLASTY;  Surgeon: Susa Day, MD;  Location: [facility];  Service: Orthopedics;  Laterality: Right;   TRICEPS TENDON REPAIR Right 05/20/2017   TRICEPS TENDON REPAIR Right 05/20/2017   Procedure: TRICEPS TENDON REPAIR;  Surgeon: Iran Planas, MD;  Location: [facility];  Service: Orthopedics;  Laterality: Right;    His Family History Is Significant For: Family History  Problem Relation Age of Onset   Diabetes Mother        DM   Stroke Father        CVA   Pancreatic cancer Brother    Pancreatic cancer Other        Nephew    His Social History Is Significant For: Social History   Socioeconomic History   Marital status: Married    Spouse name: [name]   Number of children: 4   Years of education: masters   Highest education level: Not on file  Occupational History   Occupation: Chief of Staff    Comment: Worked with [place] resource strain: Not on file   Food insecurity:    Worry: Not on file    Inability: Not on file   Transportation needs:    Medical: Not on file    Non-medical: Not on file  Tobacco Use   Smoking status: Never Smoker   Smokeless tobacco: Never Used  Substance and Sexual Activity   Alcohol use: No    Alcohol/week: 0.0 standard drinks   Drug use: No   Sexual activity: Not on file  Lifestyle   Physical activity:    Days per week: Not on file    Minutes per session: Not on file   Stress: Not on file  Relationships   Social connections:    Talks on phone: Not on file    Gets together: Not on file    Attends religious  service: Not on file    Active member of club or organization: Not on file    Attends meetings of clubs or organizations: Not on file    Relationship status: Not on file  Other Topics Concern   Not on file  Social History Narrative   Married and lives in [place].  Retired Optometrist.   No reported caffeine use.     His Allergies Are:  Allergies  Allergen Reactions   Lisinopril Cough  :   His Current Medications Are:  Outpatient Encounter Medications as of 07/06/2018  Medication Sig   ammonium lactate (LAC-HYDRIN) 12 % cream Apply topically as needed for dry skin. As prescribed (Patient taking differently:  Apply 1 g topically as needed for dry skin. As prescribed)   apixaban (ELIQUIS) 5 MG TABS tablet Take 1 tablet (5 mg total) by mouth 2 (two) times daily.   carbidopa-levodopa (SINEMET CR) 50-200 MG tablet Take 2 tablets by mouth at bedtime.   carbidopa-levodopa (SINEMET IR) 25-100 MG tablet Take 2 tablets by mouth 5 (five) times daily. At 7, 10, 1 PM, 4 PM and 7 PM   cholecalciferol (VITAMIN D) 1000 units tablet Take 1,000 Units by mouth daily. 7 am   clonazePAM (KLONOPIN) 0.5 MG disintegrating tablet Take 1 tablet (0.5 mg total) by mouth at bedtime.   doxazosin (CARDURA) 2 MG tablet Take 2 mg by mouth at bedtime.    escitalopram (LEXAPRO) 10 MG tablet Take 1 tablet (10 mg total) by mouth at bedtime.   famotidine (PEPCID) 40 MG tablet Take 40 mg by mouth every evening. 7 pm   finasteride (PROSCAR) 5 MG tablet Take 5 mg by mouth daily.   FLUAD 0.5 ML SUSY ADM 0.5ML IM UTD   furosemide (LASIX) 40 MG tablet Take 40 mg by mouth 2 (two) times daily.   gabapentin (NEURONTIN) 100 MG capsule Take 1 capsule (100 mg total) by mouth 3 (three) times daily.   losartan (COZAAR) 50 MG tablet Take 50 mg by mouth every morning.   metoprolol succinate (TOPROL-XL) 25 MG 24 hr tablet Take 25 mg by mouth every evening. 7 pm   Multiple Vitamin (MULTIVITAMIN WITH MINERALS) TABS  tablet Take 1 tablet by mouth daily. 7 am   potassium chloride (K-DUR,KLOR-CON) 10 MEQ tablet Take 10 mEq by mouth 2 (two) times daily.    pravastatin (PRAVACHOL) 40 MG tablet Take 40 mg by mouth every morning.    rivastigmine (EXELON) 3 MG capsule Take 1 capsule (3 mg total) by mouth 2 (two) times daily.   sodium chloride (OCEAN) 0.65 % SOLN nasal spray Place 1 spray into both nostrils daily as needed for congestion.   tamsulosin (FLOMAX) 0.4 MG CAPS capsule Take 0.4 mg at bedtime by mouth.   UNABLE TO FIND Take by mouth.   vitamin C (ASCORBIC ACID) 500 MG tablet Take 500 mg by mouth every morning.   No facility-administered encounter medications on file as of 07/06/2018.   :  Review of Systems:  Out of a complete 14 point review of systems, all are reviewed and negative with the exception of these symptoms as listed below:  Virtual Visit via Video Note on 07/06/2018:  I connected with Mr. And Mrs. Pfeifle on 07/06/18 at 10:30 AM EDT by a video enabled telemedicine application and verified that I am speaking with the correct person using two identifiers.   I discussed the limitations of evaluation and management by telemedicine and the availability of in person appointments. The patient expressed understanding and agreed to proceed.  History of Present Illness:  He reports worsening slowness and stiffness, it is very difficult for him to get started in the morning.He has ongoing visual hallucinations, typically in the form of people, not scary or too intrusive in his life but more frequent. His wife reports that his mobility did improve after we increase the Sinemet last time to 2 pills 5 times a day, she reports that he takes it every 3 hours and she has them in individual cops labeled with the time. This has worked well for them. He also takes the memory medication 3 mg twice daily, generic Exelon and she verified. His mood is stable, takes Lexapro  10 mg daily generic. He has more  stiffness and discomfort around the knees. No significant lower extremity swelling reported by wife. He has sleep disruption.   Observations/Objective: The most recent available vital signs are from our visit from 04/22/2018. He is situated in a Paramedic. He is in no apparent distress but quiet, significant bradyphrenia noted, however we also had internet and technical difficulty intermittently throughout the visit. He has significant hypophonia, no significant lip, neck or jaw tremor. He does have significant facial masking and decrease in eye blink rate and decrease and extraocular mobility. He has obvious slowness in his movements. He is leaning somewhat to the left.  Assessment and Plan:  In summary, NIQUAN CHARNLEY a very pleasant 78 year old male with a history of atrial fibrillation, hypertension, hyperlipidemia, obesity, arthritis with status post bilateral knee replacements(left in 2016, right in 2017), who presents for a virtual, video based follow-up consultation of his right-sided predominant, akinetic-rigid type Parkinson's disease followup consultation of his right-sided predominant,Parkinson's disease, complicated by anxiety, depression, sleep disorder,(including sleep fragmentation, nocturia,RBD,insomnia), recurrent falls, memory loss and visual hallucinations - and overall progression with time. He has had falls with injuries, sustained right triceps tendon injury in February 2019, requiring surgery and prolonged rehabilitation. He has had progression with time, and more anxiety and depression as well as  memory loss. He has been on low-dose Lexapro. For his memory, we started him on low-dose generic Exelon 1.5 mg twice a day in September 2019, we mutually agreed to increase it to 3 mg twice a day in Jan 2020. He has benefited from outpatient therapies and would be due for reevaluation in July 2020. We increased his Sinemet to 2 pills 5 times a day, every 3 hours starting at 7 AM  in January 2020 and today is suggested we increase it further to 2 pills 6 times a day, every 2-1/2 hours, starting at 7 AM. He continues to take Sinemet CR generic at bedtime around 9, he could push this out to 9:30 or stay at 9 PM for this. He is advised to continue with his other medications including clonazepam at night, and gabapentin 3 times a day. He is advised to use his u step walker or regular walker at all times as he is at fall risk. Of note, we increased the lexapro generic to 10 mg in Dec. 2017.I suggested a 3 month checkup, sooner if needed. I answered all their questions today and the patient and his wife were in agreement.  Follow Up Instructions: 1. Increase Sinemet to 2 pills 6 times a day, every 2-1/2 hours. He will start at 7 AM, second dose at 9:30 and so forth. He will take his long-acting Sinemet at 9 PM or 9:30 PM 2. Continue all other medications including gabapentin, Lexapro, Exelon, gabapentin, clonazepam. Rx for Sinemet IR generic updated, all other prescription seem to be up-to-date, renewed prescription for Lexapro generic. 3. Follow-up in clinic for face-to-face visit in 3 months. 4. Call or email through My Chart for any interim questions or concerns.     I discussed the assessment and treatment plan with the patient. The patient was provided an opportunity to ask questions and all were answered. The patient agreed with the plan and demonstrated an understanding of the instructions.   The patient was advised to call back or seek an in-person evaluation if the symptoms worsen or if the condition fails to improve as anticipated.   I provided 30 minutes of non-face-to-face  time during this encounter.   Star Age, MD

## 2018-07-06 NOTE — Patient Instructions (Signed)
Given verbally, during today's virtual video-based encounter, with verbal feedback received.   

## 2018-07-22 ENCOUNTER — Ambulatory Visit: Payer: Medicare Other | Admitting: Neurology

## 2018-08-17 ENCOUNTER — Encounter: Payer: Self-pay | Admitting: Neurology

## 2018-08-27 ENCOUNTER — Encounter: Payer: Self-pay | Admitting: Neurology

## 2018-08-30 ENCOUNTER — Telehealth: Payer: Self-pay

## 2018-08-30 NOTE — Telephone Encounter (Signed)
Received notification that [employer] approved nuplazed. "NUPLAZID CAP 34MG , use as directed, is approved through 03/24/2019 under your Medicare Part D benefit. Reviewed by: System"

## 2018-08-30 NOTE — Telephone Encounter (Signed)
PA for nuplazid completed via covermymeds, sent to [employer]. Should have a determination in 1-3 business days.  KeyMargit Hanks - PA Case ID: [id number] - Rx #: [id number]

## 2018-08-31 ENCOUNTER — Telehealth: Payer: Self-pay | Admitting: Neurology

## 2018-08-31 DIAGNOSIS — G20A1 Parkinson's disease without dyskinesia, without mention of fluctuations: Secondary | ICD-10-CM

## 2018-08-31 DIAGNOSIS — R443 Hallucinations, unspecified: Secondary | ICD-10-CM

## 2018-08-31 DIAGNOSIS — G20 Parkinson's disease: Secondary | ICD-10-CM

## 2018-08-31 NOTE — Telephone Encounter (Signed)
Nuplazid is too expensive, as I understand. I would suggest we seek consultation with a geriatric psychiatrist next, to see if we can get more help for hallucinations and mood. If they are agreeable, we can put referral in. Please call his wife to discuss.

## 2018-08-31 NOTE — Telephone Encounter (Signed)
Noted referral sent  

## 2018-08-31 NOTE — Addendum Note (Signed)
Addended by: Lester [place] A on: 08/31/2018 02:17 PM   Modules accepted: Orders

## 2018-08-31 NOTE — Telephone Encounter (Signed)
I called pt's wife, discussed this with her. She is agreeable to a geriatric psych referral and understands that the psych office will be calling them for an appt.

## 2018-09-16 DIAGNOSIS — I482 Chronic atrial fibrillation, unspecified: Secondary | ICD-10-CM | POA: Diagnosis not present

## 2018-09-16 DIAGNOSIS — G20 Parkinson's disease: Secondary | ICD-10-CM | POA: Diagnosis not present

## 2018-09-16 DIAGNOSIS — R269 Unspecified abnormalities of gait and mobility: Secondary | ICD-10-CM | POA: Diagnosis not present

## 2018-09-16 DIAGNOSIS — I872 Venous insufficiency (chronic) (peripheral): Secondary | ICD-10-CM | POA: Diagnosis not present

## 2018-10-12 ENCOUNTER — Ambulatory Visit: Payer: Medicare Other | Admitting: Neurology

## 2018-10-30 ENCOUNTER — Other Ambulatory Visit: Payer: Self-pay | Admitting: Neurology

## 2018-10-30 DIAGNOSIS — G4752 REM sleep behavior disorder: Secondary | ICD-10-CM

## 2018-11-11 IMAGING — CT CT HEAD W/O CM
4 series · 16 of 47 positions shown, 18 images · non-contrast
Comparison: CT HEAD December 31, 2015

CLINICAL DATA: Fell in bathroom. History of Parkinson's,
hypertension, atrial fibrillation.

EXAM:
CT HEAD WITHOUT CONTRAST
TECHNIQUE: Contiguous axial images were obtained from the base of the skull
through the vertex without intravenous contrast.

[Series 3: head without · axial · non-contrast · 0.43mm/px · z∈[-106,+14]mm · 7 of 33 slices shown, 9 images]
[im 5/33  brain]
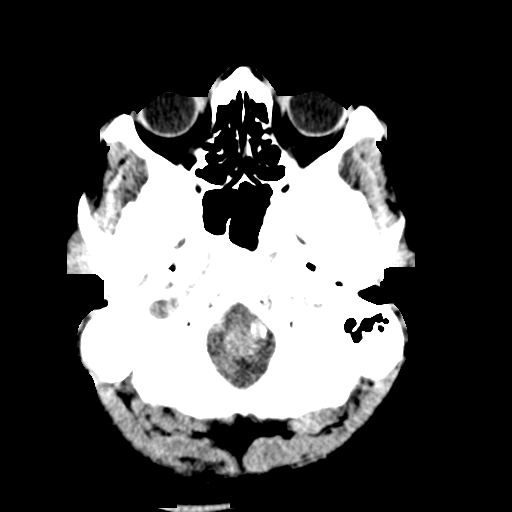
[im 5/33  bone]
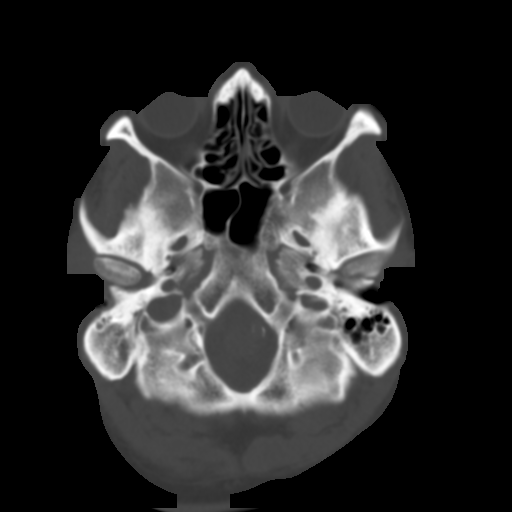
[im 9/33  brain]
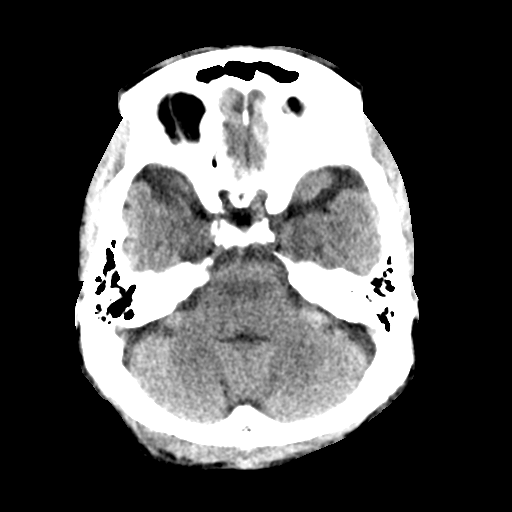
[im 13/33  brain]
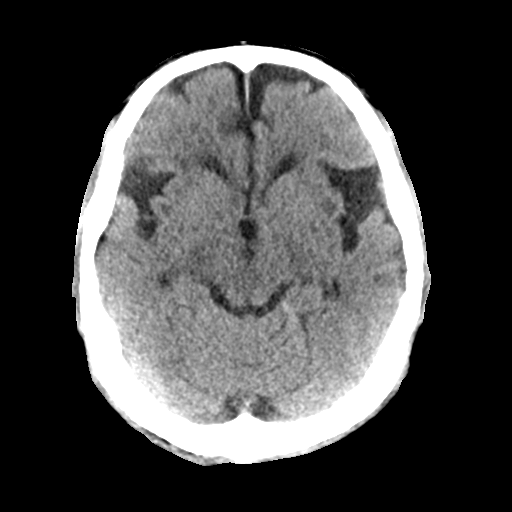
[im 17/33  brain]
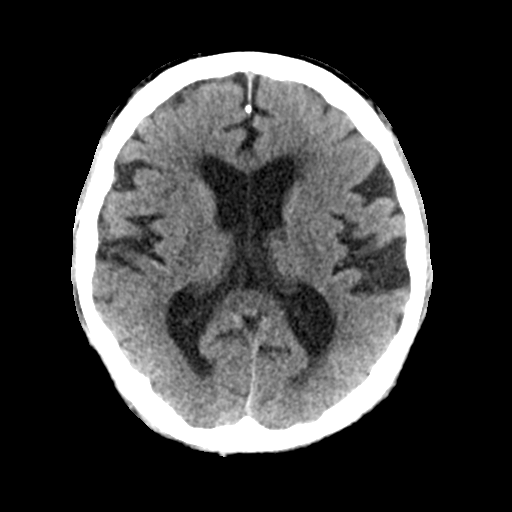
[im 21/33  brain]
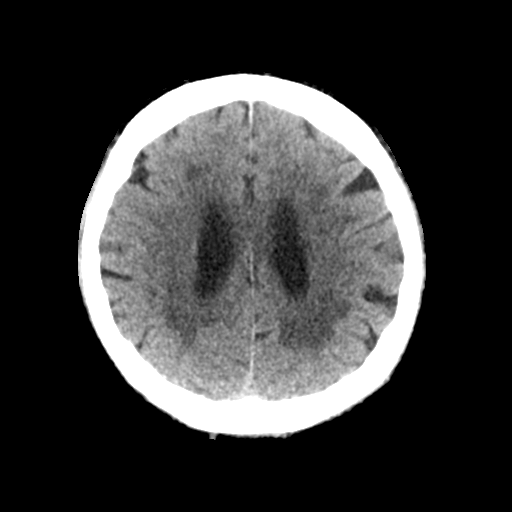
[im 21/33  bone]
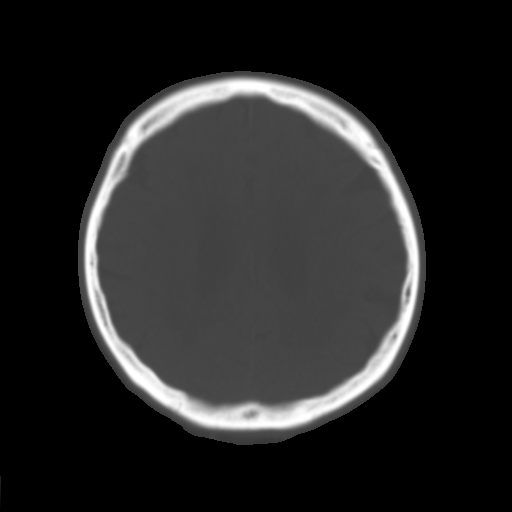
[im 25/33  brain]
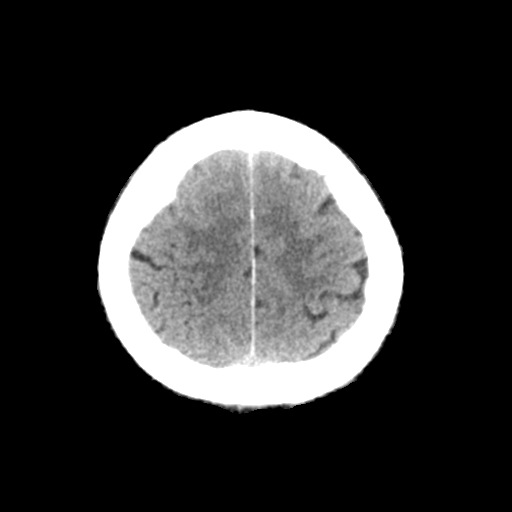
[im 29/33  brain]
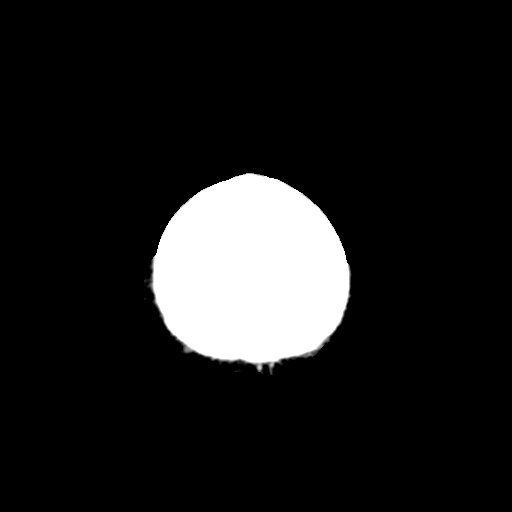

[Series 4: head bone · axial · 0.43mm/px · z∈[-110,-78]mm · 3 of 82 slices shown]
[im 9/82  bone]
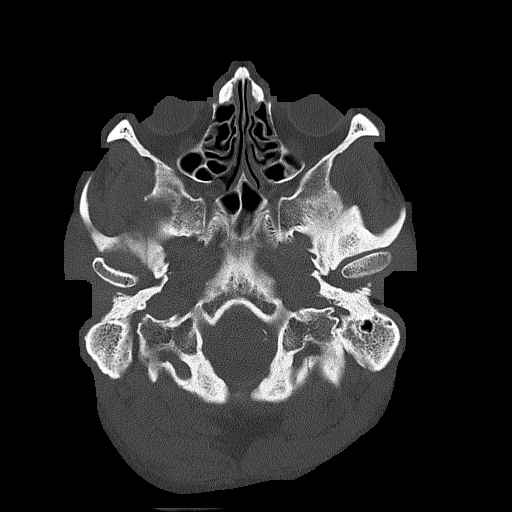
[im 17/82  bone]
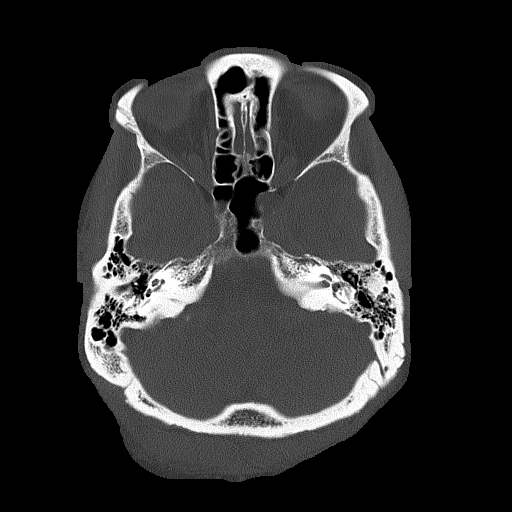
[im 25/82  bone]
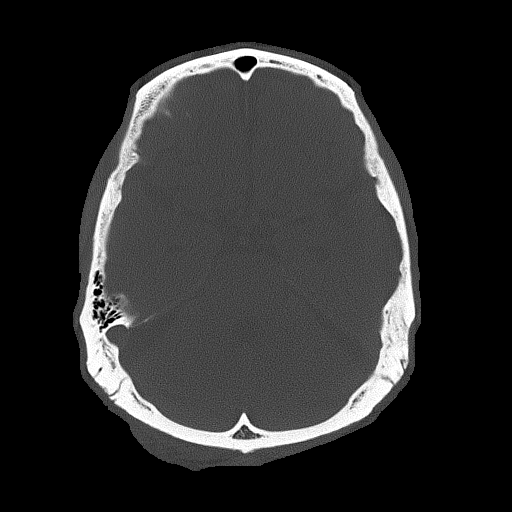

[Series 5: head without cor · coronal · non-contrast · 0.33mm/px · 3 of 75 slices shown]
[im 25/75  brain]
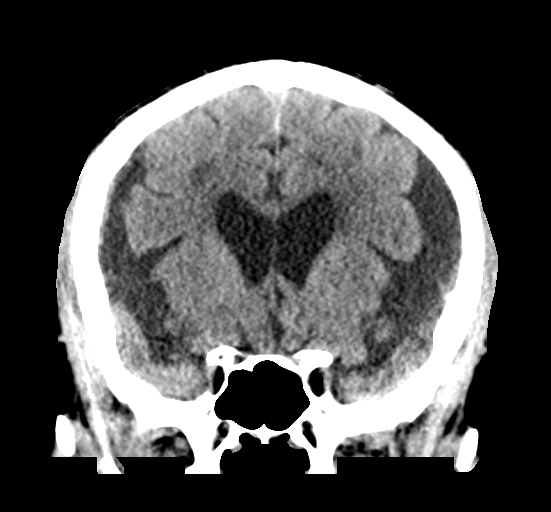
[im 33/75  brain]
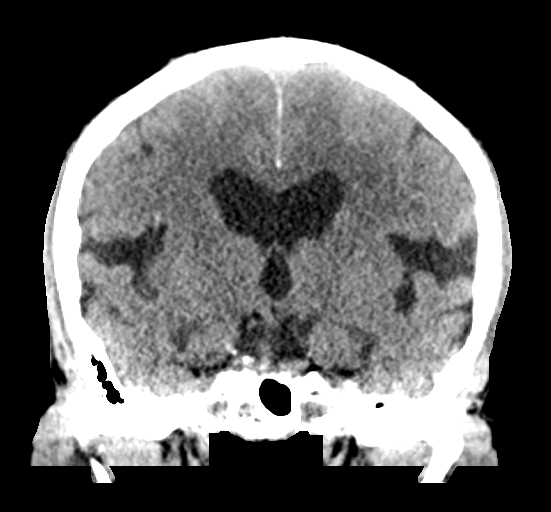
[im 42/75  brain]
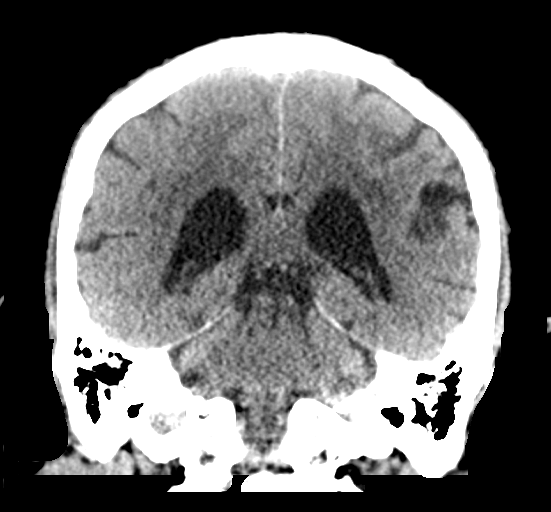

[Series 6: head without sag · sagittal · non-contrast · 0.33mm/px · 3 of 64 slices shown]
[im 22/64  brain]
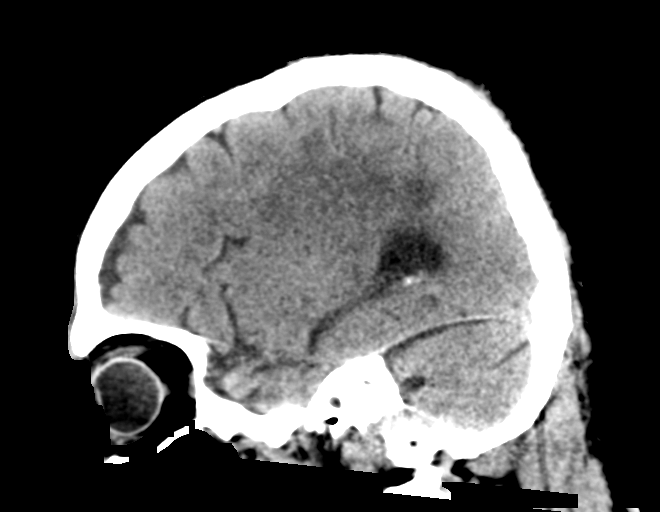
[im 32/64  brain]
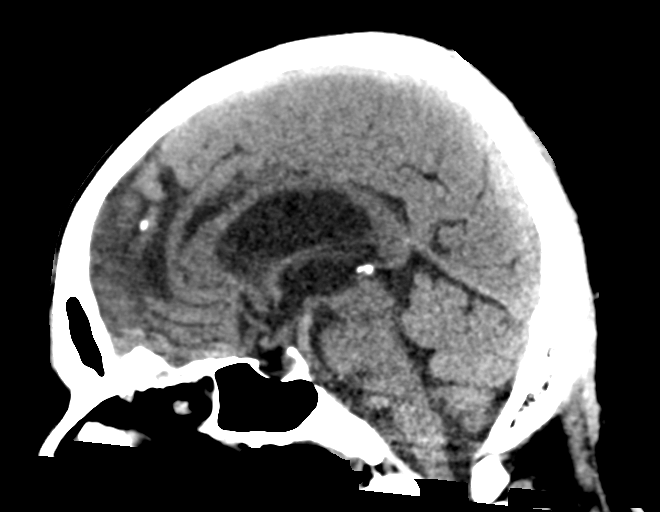
[im 43/64  brain]
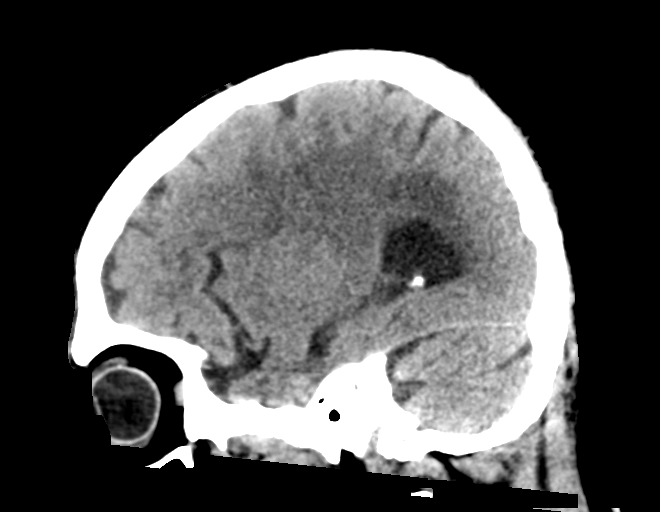

[16 of 47 positions shown; findings below may reference images not displayed]

FINDINGS: BRAIN: No intraparenchymal hemorrhage, mass effect nor midline
shift. Moderate ventriculomegaly disproportionate sulcal effacement
at the convexities, however normal callosal angle and
periventricular contour. Patchy to confluent supratentorial white
matter hypodensities. No acute large vascular territory infarcts. No
abnormal extra-axial fluid collections. Basal cisterns are patent.

VASCULAR: Mild calcific atherosclerosis of the carotid siphons.

SKULL: No skull fracture. No significant scalp soft tissue swelling.
Small RIGHT frontal scalp lipoma.

SINUSES/ORBITS: Trace paranasal sinus mucosal thickening without
air-fluid levels. Mastoid air cells are well aerated. Soft tissue
RIGHT external auditory canal compatible with cerumen. The included
ocular globes and orbital contents are non-suspicious. Status post
bilateral ocular lens implants.

OTHER: None.
IMPRESSION: 1. No acute intracranial process.
2. Moderate parenchymal brain volume loss, equivocal chronic
communicating hydrocephalus.
3. Moderate chronic small vessel ischemic disease.

## 2018-11-12 DIAGNOSIS — Z961 Presence of intraocular lens: Secondary | ICD-10-CM | POA: Diagnosis not present

## 2018-11-12 DIAGNOSIS — H524 Presbyopia: Secondary | ICD-10-CM | POA: Diagnosis not present

## 2018-11-18 DIAGNOSIS — N3281 Overactive bladder: Secondary | ICD-10-CM | POA: Diagnosis not present

## 2018-11-18 DIAGNOSIS — I482 Chronic atrial fibrillation, unspecified: Secondary | ICD-10-CM | POA: Diagnosis not present

## 2018-11-18 DIAGNOSIS — N182 Chronic kidney disease, stage 2 (mild): Secondary | ICD-10-CM | POA: Diagnosis not present

## 2018-11-18 DIAGNOSIS — N400 Enlarged prostate without lower urinary tract symptoms: Secondary | ICD-10-CM | POA: Diagnosis not present

## 2018-11-18 DIAGNOSIS — J309 Allergic rhinitis, unspecified: Secondary | ICD-10-CM | POA: Diagnosis not present

## 2018-11-18 DIAGNOSIS — I10 Essential (primary) hypertension: Secondary | ICD-10-CM | POA: Diagnosis not present

## 2018-11-18 DIAGNOSIS — G20 Parkinson's disease: Secondary | ICD-10-CM | POA: Diagnosis not present

## 2018-11-18 DIAGNOSIS — E782 Mixed hyperlipidemia: Secondary | ICD-10-CM | POA: Diagnosis not present

## 2018-11-18 DIAGNOSIS — D696 Thrombocytopenia, unspecified: Secondary | ICD-10-CM | POA: Diagnosis not present

## 2018-11-18 DIAGNOSIS — I519 Heart disease, unspecified: Secondary | ICD-10-CM | POA: Diagnosis not present

## 2018-11-18 DIAGNOSIS — F0390 Unspecified dementia without behavioral disturbance: Secondary | ICD-10-CM | POA: Diagnosis not present

## 2018-12-29 ENCOUNTER — Telehealth: Payer: Self-pay | Admitting: Neurology

## 2018-12-29 NOTE — Telephone Encounter (Signed)
Pt's wife informed us in June of 2020 that nuplazid was too expensive and Dr. Rexene Alberts ordered a geriatric psychiatry referral.

## 2018-12-29 NOTE — Telephone Encounter (Signed)
FYI-Jim a rep for the pt's Pimavanserin Tartrate (NUPLAZID) 34 MG CAPS called wanting to inform us that they have reached out to the pt again to give the pt other options since cost was a worry for the pt.

## 2018-12-29 NOTE — Telephone Encounter (Signed)
Noted, thanks!

## 2018-12-31 ENCOUNTER — Other Ambulatory Visit: Payer: Self-pay

## 2018-12-31 MED ORDER — APIXABAN 5 MG PO TABS: 5.0000 mg | ORAL_TABLET | Freq: Two times a day (BID) | ORAL | 1 refills | Status: AC

## 2018-12-31 NOTE — Telephone Encounter (Signed)
Pt last saw Dr Radford Pax 01/20/18, last labs 01/20/18 Creat 1.12, age 78, weight 102.1kg, based on specified criteria pt is on appropriate dosage of Eliquis 5mg  BID.  Will refill rx.

## 2019-01-11 DIAGNOSIS — Z23 Encounter for immunization: Secondary | ICD-10-CM | POA: Diagnosis not present

## 2019-01-20 NOTE — Telephone Encounter (Signed)
Received this referral note: "     Our office only has one provider that is accepting new patient and unfortunately that doctor does not see patients with this kind of issue. The best thing for the patient to do is call their insurance company and find out who is in network with their insurance.  You can also try the resource below to see if they can be any assistant to you and your patient.    Psychiatrist that treat delirium/ memory issues in [place]:   [facility] [place],  [phone]  [facility] [phone]  [facility] [place]  Dr. Mayme Genta [phone]  Marcy Salvo, MD [phone] , phd [place]  Dr Brayton Caves [phone]

## 2019-01-27 NOTE — Telephone Encounter (Signed)
Called patient's wife and left her a message relayed faxed referral to [facility] [phone] . [employer] will call to schedule. Thanks Hinton Dyer.

## 2019-01-30 ENCOUNTER — Encounter (HOSPITAL_COMMUNITY): Payer: Self-pay | Admitting: *Deleted

## 2019-01-30 ENCOUNTER — Emergency Department (HOSPITAL_COMMUNITY)
Admission: EM | Admit: 2019-01-30 | Discharge: 2019-01-30 | Disposition: A | Discharge status: Home / Self Care | Payer: Medicare Other | Attending: Emergency Medicine | Admitting: Emergency Medicine

## 2019-01-30 ENCOUNTER — Other Ambulatory Visit: Payer: Self-pay

## 2019-01-30 ENCOUNTER — Emergency Department (HOSPITAL_COMMUNITY): Payer: Medicare Other

## 2019-01-30 DIAGNOSIS — W19XXXA Unspecified fall, initial encounter: Secondary | ICD-10-CM | POA: Diagnosis not present

## 2019-01-30 DIAGNOSIS — Y999 Unspecified external cause status: Secondary | ICD-10-CM | POA: Insufficient documentation

## 2019-01-30 DIAGNOSIS — Y939 Activity, unspecified: Secondary | ICD-10-CM | POA: Insufficient documentation

## 2019-01-30 DIAGNOSIS — Y92009 Unspecified place in unspecified non-institutional (private) residence as the place of occurrence of the external cause: Secondary | ICD-10-CM | POA: Diagnosis not present

## 2019-01-30 DIAGNOSIS — Z79899 Other long term (current) drug therapy: Secondary | ICD-10-CM | POA: Diagnosis not present

## 2019-01-30 DIAGNOSIS — G20 Parkinson's disease: Secondary | ICD-10-CM | POA: Diagnosis not present

## 2019-01-30 DIAGNOSIS — Z7901 Long term (current) use of anticoagulants: Secondary | ICD-10-CM | POA: Diagnosis not present

## 2019-01-30 DIAGNOSIS — I129 Hypertensive chronic kidney disease with stage 1 through stage 4 chronic kidney disease, or unspecified chronic kidney disease: Secondary | ICD-10-CM | POA: Insufficient documentation

## 2019-01-30 DIAGNOSIS — Z96653 Presence of artificial knee joint, bilateral: Secondary | ICD-10-CM | POA: Insufficient documentation

## 2019-01-30 DIAGNOSIS — R531 Weakness: Secondary | ICD-10-CM | POA: Diagnosis not present

## 2019-01-30 DIAGNOSIS — S0990XA Unspecified injury of head, initial encounter: Secondary | ICD-10-CM | POA: Insufficient documentation

## 2019-01-30 DIAGNOSIS — I10 Essential (primary) hypertension: Secondary | ICD-10-CM | POA: Diagnosis not present

## 2019-01-30 DIAGNOSIS — N182 Chronic kidney disease, stage 2 (mild): Secondary | ICD-10-CM | POA: Insufficient documentation

## 2019-01-30 DIAGNOSIS — W01198A Fall on same level from slipping, tripping and stumbling with subsequent striking against other object, initial encounter: Secondary | ICD-10-CM | POA: Diagnosis not present

## 2019-01-30 LAB — URINALYSIS, ROUTINE W REFLEX MICROSCOPIC
Bilirubin Urine: NEGATIVE
Glucose, UA: NEGATIVE mg/dL
Hgb urine dipstick: NEGATIVE
Ketones, ur: 5 mg/dL — AB
Leukocytes,Ua: NEGATIVE
Nitrite: NEGATIVE
Protein, ur: NEGATIVE mg/dL
Specific Gravity, Urine: 1.008 (ref 1.005–1.030)
pH: 5 (ref 5.0–8.0)

## 2019-01-30 LAB — COMPREHENSIVE METABOLIC PANEL
ALT: 9 U/L (ref 0–44)
AST: 25 U/L (ref 15–41)
Albumin: 4 g/dL (ref 3.5–5.0)
Alkaline Phosphatase: 37 U/L — ABNORMAL LOW (ref 38–126)
Anion gap: 10 (ref 5–15)
BUN: 9 mg/dL (ref 8–23)
CO2: 27 mmol/L (ref 22–32)
Calcium: 9.5 mg/dL (ref 8.9–10.3)
Chloride: 103 mmol/L (ref 98–111)
Creatinine, Ser: 1.06 mg/dL (ref 0.61–1.24)
GFR calc Af Amer: >60 mL/min (ref >60)
GFR calc non Af Amer: >60 mL/min (ref >60)
Glucose, Bld: 90 mg/dL (ref 70–99)
Potassium: 3.7 mmol/L (ref 3.5–5.1)
Sodium: 140 mmol/L (ref 135–145)
Total Bilirubin: 1.1 mg/dL (ref 0.3–1.2)
Total Protein: 6.8 g/dL (ref 6.5–8.1)

## 2019-01-30 LAB — CBC WITH DIFFERENTIAL/PLATELET
Abs Immature Granulocytes: 0.02 10*3/uL (ref 0.00–0.07)
Basophils Absolute: 0 10*3/uL (ref 0.0–0.1)
Basophils Relative: 0 %
Eosinophils Absolute: 0.2 10*3/uL (ref 0.0–0.5)
Eosinophils Relative: 6 %
HCT: 40.7 % (ref 39.0–52.0)
Hemoglobin: 13.1 g/dL (ref 13.0–17.0)
Immature Granulocytes: 1 %
Lymphocytes Relative: 22 %
Lymphs Abs: 0.9 10*3/uL (ref 0.7–4.0)
MCH: 32.2 pg (ref 26.0–34.0)
MCHC: 32.2 g/dL (ref 30.0–36.0)
MCV: 100 fL (ref 80.0–100.0)
Monocytes Absolute: 0.3 10*3/uL (ref 0.1–1.0)
Monocytes Relative: 8 %
Neutro Abs: 2.4 10*3/uL (ref 1.7–7.7)
Neutrophils Relative %: 63 %
Platelets: 170 10*3/uL (ref 150–400)
RBC: 4.07 MIL/uL — ABNORMAL LOW (ref 4.22–5.81)
RDW: 13.1 % (ref 11.5–15.5)
WBC: 3.8 10*3/uL — ABNORMAL LOW (ref 4.0–10.5)
nRBC: 0 % (ref 0.0–0.2)

## 2019-01-30 NOTE — ED Triage Notes (Signed)
Arrives via [facility] from home and was using walker to get to bathroom, tripped, fell backwards into drywall (made a hole in the wall), no injuries, no pain. He is on Eliquis. Hx of parkinson's, has frequent falls. No LOC. 168/99, hr 81, 18 R, 100% RA

## 2019-01-30 NOTE — ED Notes (Signed)
Patient transported to CT 

## 2019-01-30 NOTE — ED Provider Notes (Signed)
[facility] EMERGENCY DEPARTMENT Provider Note   CSN: [id number] Arrival date & time: 01/30/19  1552     History   Chief Complaint Chief Complaint  Patient presents with  . Fall    HPI Brian [name] is a 78 y.o. male.     Patient with history of Parkinson disease presents to the emergency department today after a fall.  Patient is unsteady on his feet and has had several falls recently.  He has permanent atrial fibrillation and is on Eliquis.  Patient fell backward striking the back of his head, causing a hole in the wall.  Patient denies any headaches, vision changes, neck pain, vomiting.  Family reports that the patient has had some increased urinary frequency, history of UTI.  He has been more unsteady and generally weak recently.  He was confused this morning and did not recognize a family member.  This is overall unusual behavior for the patient.  No recent infection symptoms such as fever or cough.  Appetite remains good.  No diarrhea.  The onset of this condition was acute. The course is constant. Aggravating factors: none. Alleviating factors: none.       Past Medical History:  Diagnosis Date  . Abnormal PFT 1. 05/18/08  2. 11/30/08   1. Showed mild airflow obstruction, mild restriction, mild diffusion defect; FEV1 2.22(64%), FVC 3.33(65%), FEVi% 67, TLC 5.19(69%), DLCO 77%, +BD  2. FEV1 2.38(73%), FVC 3.81(80%), FEV1% 63, TLC 5.61(80%), DLCO 79%, no BD  . Allergic rhinitis   . Anxiety   . BPH (benign prostatic hyperplasia)   . Cellulitis    right leg MRSA  . Erectile dysfunction   . GERD (gastroesophageal reflux disease)   . Heart murmur    mild MR by echo  . Hyperlipidemia   . Hypertension   . Osteoarthritis   . Paresthesias 06/22/2012  . Parkinsonism ([place]) 06/22/2012  . Permanent atrial fibrillation ([place])   . Restrictive lung disease     Patient Active Problem List   Diagnosis Date Noted  . Rupture of right triceps tendon 05/20/2017  . Triceps  tendon rupture, right, initial encounter 05/20/2017  . Atrial fibrillation ([place]) [I48.91] 02/05/2017  . Urinary retention 12/31/2015  . Acute renal failure (ARF) ([place]) 12/31/2015  . Acute encephalopathy 12/31/2015  . Primary osteoarthritis of right knee 07/12/2015  . Primary osteoarthritis of left knee 11/30/2014  . Encounter for therapeutic drug monitoring 06/21/2013  . Edema of extremities 02/04/2013  . Chronic anticoagulation 02/04/2013  . GERD (gastroesophageal reflux disease)   . Restrictive lung disease   . CKD (chronic kidney disease) stage 2, GFR 60-89 ml/min   . Cellulitis   . Allergic rhinitis   . Anxiety   . Long term (current) use of anticoagulants 01/25/2013  . Parkinsonism ([place]) 06/22/2012  . Paresthesias 06/22/2012  . PULMONARY NODULE 06/02/2008  . SNORING 06/02/2008  . Hyperlipidemia 05/15/2008  . Essential hypertension 05/15/2008  . PULMONARY FUNCTION TESTS, ABNORMAL 05/15/2008    Past Surgical History:  Procedure Laterality Date  . CARDIAC CATHETERIZATION  11/2009   normal coronary arteries  . CARDIOVERSION     multiple  . CATARACT EXTRACTION    . COLONOSCOPY    . COLONOSCOPY WITH PROPOFOL N/A 09/02/2016   Procedure: COLONOSCOPY WITH PROPOFOL;  Surgeon: Garlan Fair, MD;  Location: [facility] END[facility]COPY;  Service: Endoscopy;  Laterality: N/A;  . EYE SURGERY     bilateral cataract extraction  . JOINT REPLACEMENT    . TOTAL KNEE  ARTHROPLASTY Left 11/30/2014   Procedure: TOTAL KNEE ARTHROPLASTY;  Surgeon: Susa Day, MD;  Location: [facility]S;  Service: Orthopedics;  Laterality: Left;  . TOTAL KNEE ARTHROPLASTY Right 07/12/2015   Procedure: RIGHT TOTAL KNEE ARTHROPLASTY;  Surgeon: Susa Day, MD;  Location: [facility]S;  Service: Orthopedics;  Laterality: Right;  . TRICEPS TENDON REPAIR Right 05/20/2017  . TRICEPS TENDON REPAIR Right 05/20/2017   Procedure: TRICEPS TENDON REPAIR;  Surgeon: Iran Planas, MD;  Location: [facility];  Service: Orthopedics;  Laterality: Right;         Home Medications    Prior to Admission medications   Medication Sig Start Date End Date Taking? Authorizing Provider  ammonium lactate (LAC-HYDRIN) 12 % cream Apply topically as needed for dry skin. As prescribed Patient taking differently: Apply 1 g topically as needed for dry skin. As prescribed 11/05/16   Gardiner Barefoot, DPM  apixaban (ELIQUIS) 5 MG TABS tablet Take 1 tablet (5 mg total) by mouth 2 (two) times daily. 12/31/18   Sueanne Margarita, MD  carbidopa-levodopa (SINEMET CR) 50-200 MG tablet Take 2 tablets by mouth at bedtime. 04/22/18   Star Age, MD  carbidopa-levodopa (SINEMET IR) 25-100 MG tablet 2 pills 6 times a day, starting at 7 AM, every 2 1/2 hours 07/06/18   Star Age, MD  cholecalciferol (VITAMIN D) 1000 units tablet Take 1,000 Units by mouth daily. 7 am    [provider]  clonazePAM (KLONOPIN) 0.5 MG disintegrating tablet DISSOLVE 1 TABLET IN MOUTH AT BEDTIME 11/01/18   Marcial Pacas, MD  doxazosin (CARDURA) 2 MG tablet Take 2 mg by mouth at bedtime.     [provider]  escitalopram (LEXAPRO) 10 MG tablet Take 1 tablet (10 mg total) by mouth at bedtime. 07/06/18   Star Age, MD  famotidine (PEPCID) 40 MG tablet Take 40 mg by mouth every evening. 7 pm    [provider]  finasteride (PROSCAR) 5 MG tablet Take 5 mg by mouth daily. 02/20/18   [provider]  FLUAD 0.5 ML SUSY ADM 0.5ML IM UTD 03/23/18   [provider]  furosemide (LASIX) 40 MG tablet Take 40 mg by mouth 2 (two) times daily. 02/20/18   [provider]  gabapentin (NEURONTIN) 100 MG capsule Take 1 capsule (100 mg total) by mouth 3 (three) times daily. 12/21/17   Star Age, MD  losartan (COZAAR) 50 MG tablet Take 50 mg by mouth every morning.    [provider]  metoprolol succinate (TOPROL-XL) 25 MG 24 hr tablet Take 25 mg by mouth every evening. 7 pm 04/16/16   [provider]  Multiple Vitamin (MULTIVITAMIN WITH MINERALS)  TABS tablet Take 1 tablet by mouth daily. 7 am    [provider]  Pimavanserin Tartrate (NUPLAZID) 34 MG CAPS Take 34 mg by mouth daily.    Star Age, MD  potassium chloride (K-DUR,KLOR-CON) 10 MEQ tablet Take 10 mEq by mouth 2 (two) times daily.     [provider]  pravastatin (PRAVACHOL) 40 MG tablet Take 40 mg by mouth every morning.     [provider]  rivastigmine (EXELON) 3 MG capsule Take 1 capsule (3 mg total) by mouth 2 (two) times daily. 04/22/18   Star Age, MD  sodium chloride (OCEAN) 0.65 % SOLN nasal spray Place 1 spray into both nostrils daily as needed for congestion.    [provider]  tamsulosin (FLOMAX) 0.4 MG CAPS capsule Take 0.4 mg at bedtime by mouth.  [provider]  UNABLE TO FIND Take by mouth.    [provider]  vitamin C (ASCORBIC ACID) 500 MG tablet Take 500 mg by mouth every morning.    [provider]    Family History Family History  Problem Relation Age of Onset  . Diabetes Mother        DM  . Stroke Father        CVA  . Pancreatic cancer Brother   . Pancreatic cancer Other        Nephew    Social History Social History   Tobacco Use  . Smoking status: Never Smoker  . Smokeless tobacco: Never Used  Substance Use Topics  . Alcohol use: No    Alcohol/week: 0.0 standard drinks  . Drug use: No     Allergies   Lisinopril   Review of Systems Review of Systems  Constitutional: Positive for fatigue. Negative for fever.  HENT: Negative for rhinorrhea, sore throat and tinnitus.   Eyes: Negative for photophobia, pain, redness and visual disturbance.  Respiratory: Negative for cough and shortness of breath.   Cardiovascular: Negative for chest pain.  Gastrointestinal: Negative for abdominal pain, diarrhea, nausea and vomiting.  Genitourinary: Negative for dysuria.  Musculoskeletal: Positive for gait problem. Negative for back pain, myalgias and neck pain.  Skin: Negative  for rash and wound.  Neurological: Positive for weakness (generalized). Negative for dizziness, light-headedness, numbness and headaches.  Psychiatric/Behavioral: Positive for confusion. Negative for decreased concentration.     Physical Exam Updated Vital Signs BP (!) 186/102 (BP Location: Right Arm)   Pulse 68   Temp 98.1 F (36.7 C) (Oral)   Resp 12   Ht 6\' 2"  (1.88 m)   Wt 102.1 kg   SpO2 100%   BMI 28.89 kg/m   Physical Exam Vitals signs and nursing note reviewed.  Constitutional:      Appearance: He is well-developed.  HENT:     Head: Normocephalic and atraumatic. No raccoon eyes or Battle's sign.     Right Ear: Tympanic membrane, ear canal and external ear normal. No hemotympanum.     Left Ear: Tympanic membrane, ear canal and external ear normal. No hemotympanum.     Nose: Nose normal.  Eyes:     General: Lids are normal.     Conjunctiva/sclera: Conjunctivae normal.     Pupils: Pupils are equal, round, and reactive to light.     Comments: No visible hyphema  Neck:     Musculoskeletal: Normal range of motion and neck supple.  Cardiovascular:     Rate and Rhythm: Normal rate and regular rhythm.  Pulmonary:     Effort: Pulmonary effort is normal.     Breath sounds: Normal breath sounds.  Abdominal:     Palpations: Abdomen is soft.     Tenderness: There is no abdominal tenderness.  Musculoskeletal: Normal range of motion.     Cervical back: He exhibits normal range of motion, no tenderness and no bony tenderness.     Thoracic back: He exhibits no tenderness and no bony tenderness.     Lumbar back: He exhibits no tenderness and no bony tenderness.  Skin:    General: Skin is warm and dry.  Neurological:     Mental Status: He is alert. He is disoriented.     GCS: GCS eye subscore is 4. GCS verbal subscore is 5. GCS motor subscore is 6.     Cranial Nerves: No cranial nerve deficit.  Sensory: No sensory deficit.     Coordination: Coordination normal.     Deep  Tendon Reflexes: Reflexes are normal and symmetric.      ED Treatments / Results  Labs (all labs ordered are listed, but only abnormal results are displayed) Labs Reviewed  URINALYSIS, ROUTINE W REFLEX MICROSCOPIC - Abnormal; Notable for the following components:      Result Value   Ketones, ur 5 (*)    All other components within normal limits  CBC WITH DIFFERENTIAL/PLATELET - Abnormal; Notable for the following components:   WBC 3.8 (*)    RBC 4.07 (*)    All other components within normal limits  COMPREHENSIVE METABOLIC PANEL - Abnormal; Notable for the following components:   Alkaline Phosphatase 37 (*)    All other components within normal limits    EKG None  Radiology Ct Head Wo Contrast  Result Date: 01/30/2019 CLINICAL DATA:  Patient status post fall. EXAM: CT HEAD WITHOUT CONTRAST TECHNIQUE: Contiguous axial images were obtained from the base of the skull through the vertex without intravenous contrast. COMPARISON:  Brain CT 04/27/2017. FINDINGS: Brain: Ventricles and sulci are prominent compatible with atrophy. Periventricular and subcortical white matter hypodensity compatible with chronic microvascular ischemic changes. No evidence for acute cortically based infarct, intracranial hemorrhage, mass lesion or mass-effect. Vascular: Unremarkable Skull: Intact. Sinuses/Orbits: Paranasal sinuses are well aerated. Mastoid air cells are unremarkable. Other: None. IMPRESSION: 1. No acute intracranial process. 2. Atrophy and chronic microvascular ischemic changes. Electronically Signed   By: Lovey Newcomer M.D.   On: 01/30/2019 16:51    Procedures Procedures (including critical care time)  Medications Ordered in ED Medications - No data to display   Initial Impression / Assessment and Plan / ED Course  I have reviewed the triage vital signs and the nursing notes.  Pertinent labs & imaging results that were available during my care of the patient were reviewed by me and  considered in my medical decision making (see chart for details).        Patient seen and examined. Head CT reviewed.  Patient appears well and is in no distress.  Given family's reported history of recent falls, worse than baseline confusion although mild, will check basic labs and UA.  Patient and family agree.  Vital signs reviewed and are as follows: BP (!) 186/102 (BP Location: Right Arm)   Pulse 68   Temp 98.1 F (36.7 C) (Oral)   Resp 12   Ht 6\' 2"  (1.88 m)   Wt 102.1 kg   SpO2 100%   BMI 28.89 kg/m   7:49 PM delay in labs due to multiple traumas.  UA, labs look good.  Nurse reported possible specks of blood in the stool.  Hemoglobin is normal.  Patient does not report any blood in the stool.  Patient is currently in the hallway making rectal exam difficult.  Patient and family member are comfortable with following up with their primary care doctor with concern of more frequent falls as well as question of blood in the stool.  I think this is reasonable.  Patient encouraged to return with any worsening, including large amounts of blood being visualized in the stool, additional falls or injuries, or other concerns.  Plan for discharged home.  Final Clinical Impressions(s) / ED Diagnoses   Final diagnoses:  Injury of head, initial encounter  Fall, initial encounter   Patient here status post head injury due to mechanical fall.  Patient is chronically unsteady on his  feet due to Parkinson disease.  There has been increasing falls, weakness, some confusion at home.  Patient is currently at his baseline.  Checked basic labs and UA which were reassuring.  There was a question of some blood in the underwear when patient got up with assistance to use the restroom.  It was not a large volume.  Patient with normal red blood cell count.  Defer further evaluation work-up for minor GI bleeding to PCP.  Strict return instructions as above.  Patient without any neck pain or extremity weakness.   He has not had any decompensation during ED stay.  ED Discharge Orders    None       Carlisle Cater, Hershal Coria 01/30/19 [place], MD 01/30/19 [id number]

## 2019-01-30 NOTE — Discharge Instructions (Signed)
Please read and follow all provided instructions.  Your diagnoses today include:  1. Injury of head, initial encounter   2. Fall, initial encounter     Tests performed today include:  CT scan of your head that did not show any serious injury.  Blood counts and electrolytes -were normal  Urine test-no signs of infection or other problems  Vital signs. See below for your results today.   Medications prescribed:   None  Take any prescribed medications only as directed.  Home care instructions:  Follow any educational materials contained in this packet.  BE VERY CAREFUL not to take multiple medicines containing Tylenol (also called acetaminophen). Doing so can lead to an overdose which can damage your liver and cause liver failure and possibly death.   Follow-up instructions: Please follow-up with your primary care provider in the next 3 days for further evaluation of your symptoms.  Please have them check stool for blood and monitor your blood counts.  Please discuss more frequent weakness, confusion, falls at home.  Return instructions:  SEEK IMMEDIATE MEDICAL ATTENTION IF:  You can see large amounts of blood being passed in the stool.  There is confusion or drowsiness (although children frequently become drowsy after injury).   You cannot awaken the injured person.   You have more than one episode of vomiting.   You notice dizziness or unsteadiness which is getting worse, or inability to walk.   You have convulsions or unconsciousness.   You experience severe, persistent headaches not relieved by Tylenol.  You cannot use arms or legs normally.   There are changes in pupil sizes. (This is the black center in the colored part of the eye)   There is clear or bloody discharge from the nose or ears.   You have change in speech, vision, swallowing, or understanding.   Localized weakness, numbness, tingling, or change in bowel or bladder control.  You have any other  emergent concerns.  Additional Information: You have had a head injury which does not appear to require admission at this time.  Your vital signs today were: BP (!) 186/102 (BP Location: Right Arm)    Pulse 68    Temp 98.1 F (36.7 C) (Oral)    Resp 12    Ht 6\' 2"  (1.88 m)    Wt 102.1 kg    SpO2 100%    BMI 28.89 kg/m  If your blood pressure (BP) was elevated above 135/85 this visit, please have this repeated by your doctor within one month. --------------

## 2019-01-30 NOTE — ED Triage Notes (Signed)
Pt reports fall, no neck, back or head pain. No obvious trauma.

## 2019-02-02 ENCOUNTER — Other Ambulatory Visit: Payer: Self-pay

## 2019-02-02 ENCOUNTER — Other Ambulatory Visit: Payer: Self-pay | Admitting: Neurology

## 2019-02-02 DIAGNOSIS — G4752 REM sleep behavior disorder: Secondary | ICD-10-CM

## 2019-02-03 NOTE — Telephone Encounter (Signed)
I called pt, spoke to pt's wife. Pt is overdue for an appt. An appt was scheduled for 02/09/19 at 11:30am. Pt's wife verbalized understanding of new appt date and time. Will send Dr. Rexene Alberts the clonazepam request.   Controlled Substance Registry checked.

## 2019-02-09 ENCOUNTER — Ambulatory Visit (INDEPENDENT_AMBULATORY_CARE_PROVIDER_SITE_OTHER): Payer: Medicare Other | Admitting: Neurology

## 2019-02-09 ENCOUNTER — Other Ambulatory Visit: Payer: Self-pay

## 2019-02-09 ENCOUNTER — Encounter: Payer: Self-pay | Admitting: Neurology

## 2019-02-09 VITALS — BP 136/71 | HR 51

## 2019-02-09 DIAGNOSIS — R413 Other amnesia: Secondary | ICD-10-CM | POA: Diagnosis not present

## 2019-02-09 DIAGNOSIS — R443 Hallucinations, unspecified: Secondary | ICD-10-CM | POA: Diagnosis not present

## 2019-02-09 DIAGNOSIS — G20 Parkinson's disease: Secondary | ICD-10-CM

## 2019-02-09 DIAGNOSIS — Z9181 History of falling: Secondary | ICD-10-CM | POA: Diagnosis not present

## 2019-02-09 MED ORDER — RIVASTIGMINE TARTRATE 4.5 MG PO CAPS: 4.5000 mg | ORAL_CAPSULE | Freq: Two times a day (BID) | ORAL | 3 refills | Status: DC

## 2019-02-09 NOTE — Progress Notes (Signed)
Cardiology Office Note:    Date:  02/10/2019   ID:  TUYEN LACONTE, DOB 1941/01/05, MRN [id number]  PCP:  Wenda Low, MD  Cardiologist:  Fransico Him, MD    Referring MD: Wenda Low, MD   Chief Complaint  Patient presents with  . Atrial Fibrillation  . Hypertension    History of Present Illness:    Brian [name] is a 78 y.o. male with a hx of permanentatrial fibrillationonchronicsystemicanticoagulation, chronic LE edema and HTN. He also has Parkinson's Disease which has significantly progressed since I saw him last and he is walking very slowly with a cane. He is here today for followup and is doing well.  He denies any chest pain or pressure, SOB, DOE, PND, orthopnea, LE edema, dizziness, palpitations or syncope. He is compliant with his meds and is tolerating meds with no SE.    Past Medical History:  Diagnosis Date  . Abnormal PFT 1. 05/18/08  2. 11/30/08   1. Showed mild airflow obstruction, mild restriction, mild diffusion defect; FEV1 2.22(64%), FVC 3.33(65%), FEVi% 67, TLC 5.19(69%), DLCO 77%, +BD  2. FEV1 2.38(73%), FVC 3.81(80%), FEV1% 63, TLC 5.61(80%), DLCO 79%, no BD  . Allergic rhinitis   . Anxiety   . BPH (benign prostatic hyperplasia)   . Cellulitis    right leg MRSA  . Erectile dysfunction   . GERD (gastroesophageal reflux disease)   . Heart murmur    mild MR by echo  . Hyperlipidemia   . Hypertension   . Osteoarthritis   . Paresthesias 06/22/2012  . Parkinsonism ([place]) 06/22/2012  . Permanent atrial fibrillation ([place])   . Restrictive lung disease     Past Surgical History:  Procedure Laterality Date  . CARDIAC CATHETERIZATION  11/2009   normal coronary arteries  . CARDIOVERSION     multiple  . CATARACT EXTRACTION    . COLONOSCOPY    . COLONOSCOPY WITH PROPOFOL N/A 09/02/2016   Procedure: COLONOSCOPY WITH PROPOFOL;  Surgeon: Garlan Fair, MD;  Location: [facility];  Service: Endoscopy;  Laterality: N/A;  . EYE SURGERY     bilateral  cataract extraction  . JOINT REPLACEMENT    . TOTAL KNEE ARTHROPLASTY Left 11/30/2014   Procedure: TOTAL KNEE ARTHROPLASTY;  Surgeon: Susa Day, MD;  Location: [facility];  Service: Orthopedics;  Laterality: Left;  . TOTAL KNEE ARTHROPLASTY Right 07/12/2015   Procedure: RIGHT TOTAL KNEE ARTHROPLASTY;  Surgeon: Susa Day, MD;  Location: [facility];  Service: Orthopedics;  Laterality: Right;  . TRICEPS TENDON REPAIR Right 05/20/2017  . TRICEPS TENDON REPAIR Right 05/20/2017   Procedure: TRICEPS TENDON REPAIR;  Surgeon: Iran Planas, MD;  Location: [facility];  Service: Orthopedics;  Laterality: Right;    Current Medications: Current Meds  Medication Sig  . ammonium lactate (LAC-HYDRIN) 12 % cream Apply topically as needed for dry skin. As prescribed  . apixaban (ELIQUIS) 5 MG TABS tablet Take 1 tablet (5 mg total) by mouth 2 (two) times daily.  . carbidopa-levodopa (SINEMET CR) 50-200 MG tablet Take 2 tablets by mouth at bedtime.  . carbidopa-levodopa (SINEMET IR) 25-100 MG tablet 2 pills 6 times a day, starting at 7 AM, every 2 1/2 hours  . cholecalciferol (VITAMIN D) 1000 units tablet Take 1,000 Units by mouth daily. 7 am  . clonazePAM (KLONOPIN) 0.5 MG disintegrating tablet DISSOLVE 1 TABLET IN MOUTH AT BEDTIME  . doxazosin (CARDURA) 2 MG tablet Take 2 mg by mouth at bedtime.   Marland Kitchen escitalopram (LEXAPRO) 10 MG  tablet Take 1 tablet (10 mg total) by mouth at bedtime.  . famotidine (PEPCID) 40 MG tablet Take 40 mg by mouth every evening. 7 pm  . finasteride (PROSCAR) 5 MG tablet Take 5 mg by mouth daily.  Marland Kitchen FLUAD 0.5 ML SUSY ADM 0.5ML IM UTD  . furosemide (LASIX) 40 MG tablet Take 40 mg by mouth 2 (two) times daily.  Marland Kitchen gabapentin (NEURONTIN) 100 MG capsule Take 1 capsule (100 mg total) by mouth 3 (three) times daily.  Marland Kitchen losartan (COZAAR) 50 MG tablet Take 50 mg by mouth every morning.  . metoprolol succinate (TOPROL-XL) 25 MG 24 hr tablet Take 25 mg by mouth every evening. 7 pm  . Multiple Vitamin  (MULTIVITAMIN WITH MINERALS) TABS tablet Take 1 tablet by mouth daily. 7 am  . penicillin v potassium (VEETID) 500 MG tablet Take 500 mg by mouth every 4 (four) hours.  Debby Freiberg Tartrate (NUPLAZID) 34 MG CAPS Take 34 mg by mouth daily.  . potassium chloride (K-DUR,KLOR-CON) 10 MEQ tablet Take 10 mEq by mouth 2 (two) times daily.   . pravastatin (PRAVACHOL) 40 MG tablet Take 40 mg by mouth every morning.   . rivastigmine (EXELON) 4.5 MG capsule Take 1 capsule (4.5 mg total) by mouth 2 (two) times daily.  . sodium chloride (OCEAN) 0.65 % SOLN nasal spray Place 1 spray into both nostrils daily as needed for congestion.  . tamsulosin (FLOMAX) 0.4 MG CAPS capsule Take 0.4 mg at bedtime by mouth.  Marland Kitchen UNABLE TO FIND Take by mouth.  . vitamin C (ASCORBIC ACID) 500 MG tablet Take 500 mg by mouth every morning.     Allergies:   Lisinopril   Social History   Socioeconomic History  . Marital status: Married    Spouse name: [name]  . Number of children: 4  . Years of education: masters  . Highest education level: Not on file  Occupational History  . Occupation: Chief of Staff    Comment: Worked with [employer]  . Financial resource strain: Not on file  . Food insecurity    Worry: Not on file    Inability: Not on file  . Transportation needs    Medical: Not on file    Non-medical: Not on file  Tobacco Use  . Smoking status: Never Smoker  . Smokeless tobacco: Never Used  Substance and Sexual Activity  . Alcohol use: No    Alcohol/week: 0.0 standard drinks  . Drug use: No  . Sexual activity: Not on file  Lifestyle  . Physical activity    Days per week: Not on file    Minutes per session: Not on file  . Stress: Not on file  Relationships  . Social Herbalist on phone: Not on file    Gets together: Not on file    Attends religious service: Not on file    Active member of club or organization: Not on file    Attends meetings of clubs or organizations:  Not on file    Relationship status: Not on file  Other Topics Concern  . Not on file  Social History Narrative   Married and lives in [place].  Retired Optometrist.   No reported caffeine use.      Family History: The patient's family history includes Diabetes in his mother; Pancreatic cancer in his brother and another family member; Stroke in his father.  ROS:   Please see the history of present illness.  ROS  All other systems reviewed and negative.   EKGs/Labs/Other Studies Reviewed:    The following studies were reviewed today: none  EKG:  EKG is  ordered today.  The ekg ordered today demonstrates atrial fibrillation with CVR at 61bpm with nonspecific T wave abnormality  Recent Labs: 01/30/2019: ALT 9; BUN 9; Creatinine, Ser 1.06; Hemoglobin 13.1; Platelets 170; Potassium 3.7; Sodium 140   Recent Lipid Panel No results found for: CHOL, TRIG, HDL, CHOLHDL, VLDL, LDLCALC, LDLDIRECT  Physical Exam:    VS:  BP 122/66   Pulse 61   Ht 6\' 2"  (1.88 m)   Wt 219 lb 12.8 oz (99.7 kg)   SpO2 99%   BMI 28.22 kg/m     Wt Readings from Last 3 Encounters:  02/10/19 219 lb 12.8 oz (99.7 kg)  01/30/19 225 lb (102.1 kg)  04/22/18 225 lb (102.1 kg)     GEN:  Well nourished, well developed in no acute distress HEENT: Normal NECK: No JVD; No carotid bruits LYMPHATICS: No lymphadenopathy CARDIAC: irregularly irregular, no murmurs, rubs, gallops RESPIRATORY:  Clear to auscultation without rales, wheezing or rhonchi  ABDOMEN: Soft, non-tender, non-distended MUSCULOSKELETAL:  No edema; No deformity  SKIN: Warm and dry NEUROLOGIC:  Alert and oriented x 3 PSYCHIATRIC:  Normal affect   ASSESSMENT:    1. Permanent atrial fibrillation ([place])   2. Essential hypertension   3. Edema of extremities    PLAN:    In order of problems listed above:  1.  Permanent atrial fibrillation -HR well controlled on exam today -continue Toprol XL 25mg  daily -continue Eliquis 5mg  BID -he  denies any bleeding problems on DOAC -Hbg stable at 13.1 this month  2.  HTN -BP controlled on exam -continue Toprol XL 25mg  daily, Losartan 50mg  daily and doxazosin 2mg  qhs.   -creatinine stable at 1.06 01/30/2019  3.  Chronic LE edema -controlled on diuretics -continue Lasix 40mg  BID   Medication Adjustments/Labs and Tests Ordered: Current medicines are reviewed at length with the patient today.  Concerns regarding medicines are outlined above.  Orders Placed This Encounter  Procedures  . EKG 12-Lead   No orders of the defined types were placed in this encounter.   Signed, Fransico Him, MD  02/10/2019 10:46 AM    [place]

## 2019-02-09 NOTE — Progress Notes (Signed)
Subjective:    Patient ID: Brian [name] is a 78 y.o. male.  HPI     Interim history:   [name] is a 78 year old right-handed gentleman with an underlying medical history of atrial fibrillation, hypertension, OSA, arthritis with bilateral total knee arthroplasties in 2016 and 2017 respectively, and hyperlipidemia who presents for follow-up consultation of his advanced Parkinson's disease.  This is complicated by memory loss, recurrent falls, arthritis, status post bilateral knee replacement surgeries, bladder hyperactivity, hallucinations.  The patient is accompanied by his wife again today.  I last saw him in a virtual visit on 07/06/2018, at which time I suggested we increase his Sinemet to 2 pills 6 times a day.  He was advised to continue with Sinemet CR at bedtime.   In the interim, I ordered Nuplazid for his hallucinations but it was too expensive.  I made a referral to geriatric psychiatry.  The office he was referred to declined the referral.  Today, 02/09/2019 (all dictated new, as well as above notes, some dictation done in note pad or Word, outside of chart, may appear as copied):   He reports very little, history is primarily provided by his wife, he has had repeated falls.  Sometimes he forgets to use his walker.  He has ongoing hallucinations.  His memory loss is slightly worse per wife's report.  He tries to hydrate with water.  His mobility has declined significantly.  They have moved into a townhome in July 2020, sold their home of 20 years.  Downsizing was not easy, transitioning to new place also difficult, they have their grandchildren with them typically on a daily basis and the grandkids are in remote schooling from their home.  They have 5th grader at ninth grad[employer].  Family has been supportive.  Of note, he presented recently to the emergency room after a fall on 01/30/2019.  I reviewed the emergency room records. He hit his head backwards against the wall.  He had a head CT  without contrast on 01/30/2019 and I reviewed the results: IMPRESSION: 1. No acute intracranial process. 2. Atrophy and chronic microvascular ischemic changes.   The patient's allergies, current medications, family history, past medical history, past social history, past surgical history and problem list were reviewed and updated as appropriate.    Previously (copied from previous notes for reference):     I saw him on 04/22/2018, at which time he was noted to be more slower and more unsteady. I suggested we increase his Sinemet to from 2 pills 4 times a day to 2 pills 5 times a day. I also suggested we increase his Exelon from 1.5 mg twice a day to 3 mg twice a day.   His wife called in the interim in March 2020, reporting that he was much more lethargic. She was advised to take him to primary care to get checked out for acute medical problems such as urinary tract infection, dehydration, electrolyte disturbance, upper respiratory infection etc.   I saw him on 12/21/2017, at which time he reported having fallen secondary to right leg giving out. He was having more difficulty walking. He was noted to be more forgetful by his wife. MMSE was 21 at the time and I suggested we start him on Exelon. We kept his other medications the same.    I saw him on 08/10/2013, at which time he was having physical therapy and occupational therapy at home. He had recently been discharged from inpatient rehabilitation at [facility].  He had triceps tendon repair on the right side in February 2019. I suggested we keep his prescriptions as same including Sinemet, Sinemet CR, Lexapro and clonazepam. I prescribed a U step walker.    I saw him on 05/18/2017, at which time he reported a recent fall with injury to right elbow. He also bumped his head. He fell in the bathroom. He went to the emergency room and I reviewed records at the time. He had a head CT without contrast which showed no acute process, moderate volume loss,  moderate chronic small vessel disease. He needed surgery to his right arm under orthopedics.   I saw him on 01/12/2017, at which time he reported to recent falls. He was trying to work out on his own and was supposed to start working with a Physiological scientist.     I saw him on 07/09/2016, at which time he was fairly stable.     I saw him on 03/06/2016 at which time he reported doing better, he had some residual right knee pain. He had an appointment with his orthopedic surgeon. He reported one fall that led to an ER visit via EMS. He also had an admission to the hospital in October 2017 secondary to nausea, vomiting, urinary retention. He was noted to have altered mental status and acute kidney injury. He had a head CT without contrast on 12/31/2015 which showed:    1.  No acute intracranial abnormality. 2. Mild increase in generalized atrophy from 2013. Chronic small vessel ischemia is stable.   He was in the hospital for 4 days, then at [facility] in rehabilitation for about 10 days. He has ongoing issues with particularly right leg stiffness, no additional falls, does not appear to be very motivated to exercise. He had been in the PD boxing class. I suggested we continue with Sinemet 2 pills 4 times a day, Sinemet CR at bedtime, gabapentin, clonazepam for RBD at night and the only change was an increase in Lexapro from 5 mg to 10 mg daily.   I saw him on 10/22/2015, at which time he reported worse mobility, more stiffness in the right leg, more freezing episodes, difficulty getting out of bed especially at night when he would have to go to the bathroom. His wife would have to help him. Thankfully, he had no recent falls and was using a cane. He had finished physical therapy was not using his walker. He was not exercising very much to. He was more sedentary. He had right total knee replacement on 07/12/2015. I suggested we increase his Sinemet to 2 pills 4 times a day. We kept his gabapentin the  same. We kept the Sinemet CR at bedtime the same.   I saw him on 04/26/2015, at which time he reported taking carbidopa/levodopa 25-100 milligrams strength, about 7 pills per day, 2 for the 1st dose, 1 1/2 pills for the 2nd and 4 th dose, 2 for the 3rd dose, and 1 CR at night. He finished physical therapy and was able to walk without a cane. He reported right knee pain and indicated that he may need right knee surgery as well.   I saw him on 01/18/2015, at which time he reported doing better slowly. He had finished home health physical therapy. He was supposed to start outpatient physical therapy through orthopedics. He had left total knee arthroplasty on 11/30/2014. I reviewed the operative note as well as his discharge summary. He was discharged on 12/03/2014 to inpatient rehabilitation. Of  note, he presented to the emergency room on 12/16/2014 secondary to repeated vomiting. This was deemed secondary to constipation. Cardiac enzymes were tested which were negative for any acute coronary syndrome. I reviewed the emergency room records. He had blood work which I reviewed. INR was 2.59. Hemoglobin was slightly low at 11.9 and hematocrit was 34.6. Troponin was negative 2. He overall felt a little better since we increased his Sinemet. He was taking a long-acting Sinemet CR at night. He had some off time in the middle of the night.  He felt he was making slow progress after his knee replacement surgery. He had some issues with edema. He was wearing compression stockings for this. He was also placed on furosemide for a little while. He was no longer on narcotic pain medications. He was using a cane at the time. I suggested we continue with his medication regimen, he was alternating Sinemet 2 pills with 1-1/2 pills for a total of 4 doses a day.   He missed an appointment on 12/19/2014. I saw him on 08/22/2014, at which time he reported bilateral knee pain. He had seen Dr. Tonita Cong for his knee arthritis and had  undergone injections into both knees. He did not think the injections helped a lot. He was exploring knee replacement surgery. He was having more difficulty getting out of chairs. He had no freezing. He did think that the increase in Sinemet had helped in the past. He was alternating 1 pill with 1-1/2 pills. He was taking Sinemet CR at night. He was on gabapentin 100 mg 3 times a day and Lexapro 5 mg daily as well as clonazepam 0.5 mg each night. He had no new memory or mood issues. He was overall moving slower. He had no recent falls.   In the interim, his wife called on 01/04/2015 reported that he had worsening symptoms. I suggested we increase his Sinemet slightly to 2 tablets alternating with 1-1/2 tablets.   I saw him on 06/01/2014, at which time he reported overall doing well. He was not able to sleep through the night. He was consistently waking up between 3 and 4 AM. He had nocturia twice on average. He felt improved from the bladder medication but still reported disrupted sleep. He felt clonazepam has helped. His wife agreed. He also felt that the increase in Sinemet was helpful in his motor function. It was difficult for him to keep up with the 5 pills a day schedule. He was going to the [facility] 3 times a week for about 30 minutes. Lexapro has helped his mood. His wife felt that he was less anxious. I suggested we increase his clonazepam to 0.5 mg each night. I suggested we change his Sinemet to 1 pill alternating with one half pills for a total of 5 pills daily but for doses. I suggested he continue with Sinemet CR at bedtime.   I saw him on 03/27/2014, at which time he reported sleeping a little bit better but his tremor was worse. He did not feel Sinemet was lasting him 4 hours in between 2 different doses. He was going to the gym twice a week. He was back on an antidepressant. He was supposed to start a new bladder medication. He never actually started clonazepam for RBD. I prescribed clonazepam for  him for RBD. I increased his Sinemet to one fill 5 times a day.   I saw him on 12/01/2013, at which time he reported, that he stopped Myrbetric due to new  onset facial swelling, but this was no better after stopping it. His facial swelling may have started after the lexapro. Adding long-acting Sinemet helped him sleep a little bit longer up to 3 AM. I suggested he stop Lexapro because of a possible allergic reaction to it. I also added low-dose clonazepam for his RBD and for his sleep.   I saw him on 09/29/2013 at which time he presented for a sooner than scheduled appointment because of increasing tremors and parkinsonian symptoms. He also felt more anxious but not frankly depressed. His wife felt that he may have been depressed. She reported dream enactments. She also reported loud snoring. He had a sleep study over 5 years ago, and that was negative for OSA at the time. He had started Myrbetriq recently. He has been taking gabapentin 3 times a day, and 7, 11 and 3. He takes Sinemet regularly at 7, 11, 3, and 7. I suggested that he start low-dose Lexapro at 5 mg strength. I changed the timing of his gabapentin to one pill at 7 AM, 1 pill at 11 AM and one pill at 7 PM. I asked him to continue with Sinemet 4 times a day. I asked him to add a long-acting Sinemet CR 50/200 mg strength once daily at bed time. I suggested he return for a sleep study. He had a diagnostic polysomnogram on 11/09/2013 and I went over his test results with him in detail today. Sleep efficiency was reduced at 58.7% with a latency to sleep of 82 minutes and wake after sleep onset of 117 minutes with mild to moderate sleep fragmentation noted. He had an increased percentage of light stage sleep, absence of deep sleep and 16.8% of dream sleep with a prolonged REM latency. He had mild to moderate snoring. Total AHI was 3.2 per hour, he had some lack of REM atonia.   I saw him on 05/31/2013, at which time I felt he was fairly stable. I kept  him on Sinemet 4 times a day and gabapentin 100 mg 3 times a day. He reported, going up to 4 times a day with his Sinemet helped. He reported no side effects with gabapentin or Sinemet and seemed to tolerate them well. He had some residual facial tingling which was tolerable to him. He has LBP and went to [facility]. He had an Xray of the back and was told there was degenerative disease and was given and was given a 3 week taper of oral steroids, which helped. He had stopped exercising d/t back pain and was scheduled for PT evaluation through ortho. He has been on metamucil for constipation which helped.   I saw him on 11/30/2012, at which time I suggested that he continue gabapentin 100 mg 1 pill 3 times a day for his paresthesias and encouraged him to continue Sinemet with 25/100 mg strength one tablet 4 times a day, at 7, 11, 3 PM and 7 PM.     I first met him on 06/22/2012 and he presented on 08/23/2012 for a sooner than scheduled appointment because he felt his medication was not helpful. He previously followed with Dr. Morene Antu and had been complaining of paresthesias. Dr. Erling Cruz had started him on low-dose gabapentin. For his parkinsonism which was first noted in June of 2012 he was tried on pramipexole which helped, but at his first visit with me he told me that his gabapentin was not helpful and he also discontinued pramipexole a few months prior because of swelling  in his feet and ankles. This improved after stopping both medications. At the time of his first visit with me I felt he had mild parkinsonism without much in the way of lateralization. I started him on gabapentin again because of his paresthesias and also asked him to start low-dose Sinemet. He called back stating that the Sinemet was not helpful. He requested a sooner appointment. He had been continuing taking gabapentin 100 mg 3 times a day and Sinemet one pill 3 times a day with minimal improvement as he reported last time. I suggested  at that visit that he increase his Sinemet to one pill 4 times a day and continue with gabapentin 100 mg 3 times a day. I also felt that he may have right sided predominant idiopathic Parkinson's disease due to an intermittent tremor noted only on the right side.    His Past Medical History Is Significant For: Past Medical History:  Diagnosis Date  . Abnormal PFT 1. 05/18/08  2. 11/30/08   1. Showed mild airflow obstruction, mild restriction, mild diffusion defect; FEV1 2.22(64%), FVC 3.33(65%), FEVi% 67, TLC 5.19(69%), DLCO 77%, +BD  2. FEV1 2.38(73%), FVC 3.81(80%), FEV1% 63, TLC 5.61(80%), DLCO 79%, no BD  . Allergic rhinitis   . Anxiety   . BPH (benign prostatic hyperplasia)   . Cellulitis    right leg MRSA  . Erectile dysfunction   . GERD (gastroesophageal reflux disease)   . Heart murmur    mild MR by echo  . Hyperlipidemia   . Hypertension   . Osteoarthritis   . Paresthesias 06/22/2012  . Parkinsonism ([place]) 06/22/2012  . Permanent atrial fibrillation ([place])   . Restrictive lung disease     His Past Surgical History Is Significant For: Past Surgical History:  Procedure Laterality Date  . CARDIAC CATHETERIZATION  11/2009   normal coronary arteries  . CARDIOVERSION     multiple  . CATARACT EXTRACTION    . COLONOSCOPY    . COLONOSCOPY WITH PROPOFOL N/A 09/02/2016   Procedure: COLONOSCOPY WITH PROPOFOL;  Surgeon: Garlan Fair, MD;  Location: [facility];  Service: Endoscopy;  Laterality: N/A;  . EYE SURGERY     bilateral cataract extraction  . JOINT REPLACEMENT    . TOTAL KNEE ARTHROPLASTY Left 11/30/2014   Procedure: TOTAL KNEE ARTHROPLASTY;  Surgeon: Susa Day, MD;  Location: [facility];  Service: Orthopedics;  Laterality: Left;  . TOTAL KNEE ARTHROPLASTY Right 07/12/2015   Procedure: RIGHT TOTAL KNEE ARTHROPLASTY;  Surgeon: Susa Day, MD;  Location: [facility];  Service: Orthopedics;  Laterality: Right;  . TRICEPS TENDON REPAIR Right 05/20/2017  . TRICEPS TENDON REPAIR Right  05/20/2017   Procedure: TRICEPS TENDON REPAIR;  Surgeon: Iran Planas, MD;  Location: [facility];  Service: Orthopedics;  Laterality: Right;    His Family History Is Significant For: Family History  Problem Relation Age of Onset  . Diabetes Mother        DM  . Stroke Father        CVA  . Pancreatic cancer Brother   . Pancreatic cancer Other        Nephew    His Social History Is Significant For: Social History   Socioeconomic History  . Marital status: Married    Spouse name: [name]  . Number of children: 4  . Years of education: masters  . Highest education level: Not on file  Occupational History  . Occupation: Chief of Staff    Comment: Worked with [employer]  Needs  . Financial resource strain: Not on file  . Food insecurity    Worry: Not on file    Inability: Not on file  . Transportation needs    Medical: Not on file    Non-medical: Not on file  Tobacco Use  . Smoking status: Never Smoker  . Smokeless tobacco: Never Used  Substance and Sexual Activity  . Alcohol use: No    Alcohol/week: 0.0 standard drinks  . Drug use: No  . Sexual activity: Not on file  Lifestyle  . Physical activity    Days per week: Not on file    Minutes per session: Not on file  . Stress: Not on file  Relationships  . Social Herbalist on phone: Not on file    Gets together: Not on file    Attends religious service: Not on file    Active member of club or organization: Not on file    Attends meetings of clubs or organizations: Not on file    Relationship status: Not on file  Other Topics Concern  . Not on file  Social History Narrative   Married and lives in [place].  Retired Optometrist.   No reported caffeine use.     His Allergies Are:  Allergies  Allergen Reactions  . Lisinopril Cough  :   His Current Medications Are:  Outpatient Encounter Medications as of 02/09/2019  Medication Sig  . ammonium lactate (LAC-HYDRIN) 12 % cream Apply topically as  needed for dry skin. As prescribed (Patient taking differently: Apply 1 g topically as needed for dry skin. As prescribed)  . apixaban (ELIQUIS) 5 MG TABS tablet Take 1 tablet (5 mg total) by mouth 2 (two) times daily.  . carbidopa-levodopa (SINEMET CR) 50-200 MG tablet Take 2 tablets by mouth at bedtime.  . carbidopa-levodopa (SINEMET IR) 25-100 MG tablet 2 pills 6 times a day, starting at 7 AM, every 2 1/2 hours  . cholecalciferol (VITAMIN D) 1000 units tablet Take 1,000 Units by mouth daily. 7 am  . clonazePAM (KLONOPIN) 0.5 MG disintegrating tablet DISSOLVE 1 TABLET IN MOUTH AT BEDTIME  . doxazosin (CARDURA) 2 MG tablet Take 2 mg by mouth at bedtime.   Marland Kitchen escitalopram (LEXAPRO) 10 MG tablet Take 1 tablet (10 mg total) by mouth at bedtime.  . famotidine (PEPCID) 40 MG tablet Take 40 mg by mouth every evening. 7 pm  . finasteride (PROSCAR) 5 MG tablet Take 5 mg by mouth daily.  Marland Kitchen FLUAD 0.5 ML SUSY ADM 0.5ML IM UTD  . furosemide (LASIX) 40 MG tablet Take 40 mg by mouth 2 (two) times daily.  Marland Kitchen gabapentin (NEURONTIN) 100 MG capsule Take 1 capsule (100 mg total) by mouth 3 (three) times daily.  Marland Kitchen losartan (COZAAR) 50 MG tablet Take 50 mg by mouth every morning.  . metoprolol succinate (TOPROL-XL) 25 MG 24 hr tablet Take 25 mg by mouth every evening. 7 pm  . Multiple Vitamin (MULTIVITAMIN WITH MINERALS) TABS tablet Take 1 tablet by mouth daily. 7 am  . Pimavanserin Tartrate (NUPLAZID) 34 MG CAPS Take 34 mg by mouth daily.  . potassium chloride (K-DUR,KLOR-CON) 10 MEQ tablet Take 10 mEq by mouth 2 (two) times daily.   . pravastatin (PRAVACHOL) 40 MG tablet Take 40 mg by mouth every morning.   . rivastigmine (EXELON) 3 MG capsule Take 1 capsule (3 mg total) by mouth 2 (two) times daily.  . sodium chloride (OCEAN) 0.65 % SOLN nasal spray Place 1  spray into both nostrils daily as needed for congestion.  . tamsulosin (FLOMAX) 0.4 MG CAPS capsule Take 0.4 mg at bedtime by mouth.  Marland Kitchen UNABLE TO FIND Take by  mouth.  . vitamin C (ASCORBIC ACID) 500 MG tablet Take 500 mg by mouth every morning.   No facility-administered encounter medications on file as of 02/09/2019.   :  Review of Systems:  Out of a complete 14 point review of systems, all are reviewed and negative with the exception of these symptoms as listed below: Review of Systems  Neurological:       Pt presents today to discuss his PD. Pt's wife reports that pt has fallen several times.    Objective:  Neurological Exam  Physical Exam Physical Examination:   Vitals:   02/09/19 1148  BP: 136/71  Pulse: (!) 51    General Examination: The patient is a very pleasant 78 y.o. male in no acute distress. He appears frail, he is situated in a wheelchair, well groomed.   HEENT:Normocephalic, atraumatic, pupils are equal, round and reactive to light, s/p cataract repairs. Extraocular tracking shows moderatesaccadic breakdown.Moderate to severe nuclear rigidity is noted. No lip, neck or jaw tremor is noted.He has mod hypophonia, mild dysarthria noted. More anterocollis. Oropharynx examination reveals no new findings, except moderate mouth dryness. Tongue is central, palate symmetrical. Mild sialorrhea.Leaning to the left in his wheelchair.  Chest:Clear to auscultation without wheezing, rhonchi or crackles noted.  Heart:S1+S2+0, regular and normal without murmurs, rubs or gallops noted.   Abdomen:Soft, non-tender and non-distended.  Extremities:There isnoedema in the distal lower extremities bilaterally.   Skin: Warm and dry without trophic changes noted.  Musculoskeletal: exam reveals no obvious joint deformities, tenderness or joint swelling or erythema, healing/healedscars from TKAs b/l.no obvious swelling, no pain, slight decrease inleg extension range of motion bilaterally.   Neurologically:  Mental status: The patient is awake, alert and oriented in all 4 spheres. Hisimmediate and remote memory, attention,  language skills and fund of knowledge aremildly impaired with some slowness in thinking.Mood is normaland affect is normal.  On9/30/2019: MMSE: 21/28, CDT: could not draw. AFT: 11/min.  On 04/22/2018: MMSE: 23/28, cannot draw, CDT: Cannot draw a clock, AFT: 8/min.  On 02/09/2019: MMSE: 18/30, Cannot draw, cannot draw clock, AFT: 5/min.  Cranial nerves II - XII are as described above under HEENT exam. Motor exam: Normal bulk,globalstrengthof 4/5, No restingtremor or rebound. Romberg is not tested for safety reasons.Reflexes arenot tested today. Fine motor exam shows severe impairment on the right, moderate impairment on the left.severe slowness noted today. No obvious resting tremor. Cerebellar testing: No dysmetria or intention tremor.  Sensory exam: intact to light touch in the upper and lower extremities.  Gait, station and balance:I did not have him stand or walk for me today, as he is in a wheelchair and the walker is available.  Assessment and Plan:   In summary, CARL BUTNER a very pleasant 78 year old male with a history of atrial fibrillation, hypertension, hyperlipidemia, who presents for followup consultation of his right-sided predominant,Parkinson's disease, akinetic-rigid type, complicated by Recurrent falls, memory loss, anxiety, depression, sleep disturbance, arthritis with status post bilateral knee replacement surgeries And hallucinations.  Nuplazid was not affordable unfortunately.  He has had further decline in his mobility and increase in falls.  He uses a walker but sometimes forgets to use it.  He recently fell andHit his head, CT scan was negative for acute injury/hemorrhage.  His memory loss has progressed as well.  He is currently on generic Exelon 3 mg twice daily.  I would like to increase it to 4.5 mg twice daily.  I would like to keep his other medications the same including Sinemet IR, Sinemet CR, clonazepam, Lexapro.  I have placed a request for  consultation with geriatric psychiatry.  This has not yet come to fruition. We increased the lexapro generic to 10 mg in Dec. 2017.We will reinitiate the psychiatry referral.  We talked about the importance of fall prevention.  He is advised to change positions slowly, use his walker at all times, stay well-hydrated.  His wife may need more help at the house but currently she is reluctant to have somebody from the outside to come to the home because of the pandemic.  They do have family support thankfully.  I suggested a 9-monthrecheck, adjusted the prescription for Exelon 4.5 mg strength and his other prescriptions were up-to-date.  I answered all the questions today and the patient and his wife were in agreement.  This was an extended visit. I spent 40 minutes in total face-to-face time with the patient, more than 50% of which was spent in counseling and coordination of care, reviewing test results, reviewing medication and discussing or reviewing the diagnosis of PD its prognosis and treatment options. Pertinent laboratory and imaging test results that were available during this visit with the patient were reviewed by me and considered in my medical decision making (see chart for details).

## 2019-02-09 NOTE — Patient Instructions (Signed)
We will check into the psychiatry referral.  The office we referred you to does not accept patients with Parkinson's disease or for geriatric psychiatry issues.  We will resend the referral.  As discussed, we will increase your Exelon for memory loss to 4.5 mg capsule twice daily, I renewed your prescription with the new dose.  We will keep all your other medications the same.  Fall prevention is essential.  Please use your walker at all times, change positions slowly, Stay well-hydrated with water. Follow-up in 4 months.

## 2019-02-10 ENCOUNTER — Other Ambulatory Visit: Payer: Self-pay

## 2019-02-10 ENCOUNTER — Ambulatory Visit (INDEPENDENT_AMBULATORY_CARE_PROVIDER_SITE_OTHER): Payer: Medicare Other | Admitting: Cardiology

## 2019-02-10 ENCOUNTER — Encounter: Payer: Self-pay | Admitting: Cardiology

## 2019-02-10 VITALS — BP 122/66 | HR 61 | Ht 74.0 in | Wt 219.8 lb

## 2019-02-10 DIAGNOSIS — I4821 Permanent atrial fibrillation: Secondary | ICD-10-CM | POA: Diagnosis not present

## 2019-02-10 DIAGNOSIS — I10 Essential (primary) hypertension: Secondary | ICD-10-CM | POA: Diagnosis not present

## 2019-02-10 DIAGNOSIS — R600 Localized edema: Secondary | ICD-10-CM

## 2019-02-10 NOTE — Patient Instructions (Signed)
Medication Instructions:  Your physician recommends that you continue on your current medications as directed. Please refer to the Current Medication list given to you today.  *If you need a refill on your cardiac medications before your next appointment, please call your pharmacy*  Follow-Up: At [facility], you and your health needs are our priority.  As part of our continuing mission to provide you with exceptional heart care, we have created designated Provider Care Teams.  These Care Teams include your primary Cardiologist (physician) and Advanced Practice Providers (APPs -  Physician Assistants and Nurse Practitioners) who all work together to provide you with the care you need, when you need it.  Your next appointment:   12 month(s)  The format for your next appointment:   In Person  Provider:   You may see Fransico Him, MD or one of the following Advanced Practice Providers on your designated Care Team:    Melina Copa, PA-C  Ermalinda Barrios, PA-C

## 2019-03-15 ENCOUNTER — Telehealth: Payer: Self-pay

## 2019-03-15 NOTE — Telephone Encounter (Signed)
PA for Nuplazid has been sent via [facility] . (Key: [id number])  Your information has been sent to [facility].

## 2019-03-22 NOTE — Telephone Encounter (Signed)
Message from plan: Request Reference Number: [id number]. NUPLAZID CAP 34MG  is approved through 03/23/2020. For further questions, call [phone].

## 2019-03-29 ENCOUNTER — Telehealth: Payer: Self-pay | Admitting: Neurology

## 2019-03-29 NOTE — Telephone Encounter (Signed)
I have spoke to patient's wife x 2 she needs to call [facility] and schedule gave wife telephone number and explained again she would have to set up his apt. Patient has already been accepted . Thanks Hinton Dyer  ===View-only below this line=== ----- Message ----- From: [name], RN Sent: 02/09/2019   1:18 PM EST To: [name]  Can you find another psychiatry office that will accept this pt? See referral notes.

## 2019-04-15 ENCOUNTER — Encounter: Payer: Self-pay | Admitting: Neurology

## 2019-04-18 MED ORDER — NUPLAZID 34 MG PO CAPS: 34.0000 mg | ORAL_CAPSULE | Freq: Every day | ORAL | 4 refills | Status: DC

## 2019-04-25 ENCOUNTER — Telehealth: Payer: Self-pay | Admitting: Neurology

## 2019-04-25 DIAGNOSIS — N3281 Overactive bladder: Secondary | ICD-10-CM | POA: Diagnosis not present

## 2019-04-25 DIAGNOSIS — Z1389 Encounter for screening for other disorder: Secondary | ICD-10-CM | POA: Diagnosis not present

## 2019-04-25 DIAGNOSIS — Z0000 Encounter for general adult medical examination without abnormal findings: Secondary | ICD-10-CM | POA: Diagnosis not present

## 2019-04-25 DIAGNOSIS — N400 Enlarged prostate without lower urinary tract symptoms: Secondary | ICD-10-CM | POA: Diagnosis not present

## 2019-04-25 DIAGNOSIS — I519 Heart disease, unspecified: Secondary | ICD-10-CM | POA: Diagnosis not present

## 2019-04-25 DIAGNOSIS — E782 Mixed hyperlipidemia: Secondary | ICD-10-CM | POA: Diagnosis not present

## 2019-04-25 DIAGNOSIS — G20 Parkinson's disease: Secondary | ICD-10-CM | POA: Diagnosis not present

## 2019-04-25 DIAGNOSIS — F0390 Unspecified dementia without behavioral disturbance: Secondary | ICD-10-CM | POA: Diagnosis not present

## 2019-04-25 DIAGNOSIS — I482 Chronic atrial fibrillation, unspecified: Secondary | ICD-10-CM | POA: Diagnosis not present

## 2019-04-25 DIAGNOSIS — I872 Venous insufficiency (chronic) (peripheral): Secondary | ICD-10-CM | POA: Diagnosis not present

## 2019-04-25 DIAGNOSIS — J309 Allergic rhinitis, unspecified: Secondary | ICD-10-CM | POA: Diagnosis not present

## 2019-04-25 DIAGNOSIS — I10 Essential (primary) hypertension: Secondary | ICD-10-CM | POA: Diagnosis not present

## 2019-04-25 DIAGNOSIS — K219 Gastro-esophageal reflux disease without esophagitis: Secondary | ICD-10-CM | POA: Diagnosis not present

## 2019-04-25 DIAGNOSIS — F419 Anxiety disorder, unspecified: Secondary | ICD-10-CM | POA: Diagnosis not present

## 2019-04-25 NOTE — Telephone Encounter (Signed)
Patient's pharmacy call ed on Friday , stating his prescription for NUPLAZID was not valid(?).  They asked for the prescriber's NPI and DEA number of Dr Rexene Alberts, which I could not provide. I changed the prescription to my name for the above named reason.

## 2019-05-04 ENCOUNTER — Other Ambulatory Visit: Payer: Self-pay | Admitting: Neurology

## 2019-05-04 DIAGNOSIS — G20 Parkinson's disease: Secondary | ICD-10-CM

## 2019-05-04 DIAGNOSIS — F419 Anxiety disorder, unspecified: Secondary | ICD-10-CM

## 2019-05-04 DIAGNOSIS — G4752 REM sleep behavior disorder: Secondary | ICD-10-CM

## 2019-06-09 ENCOUNTER — Ambulatory Visit: Payer: Self-pay | Admitting: Neurology

## 2019-06-15 ENCOUNTER — Encounter: Payer: Self-pay | Admitting: Neurology

## 2019-06-15 ENCOUNTER — Ambulatory Visit (INDEPENDENT_AMBULATORY_CARE_PROVIDER_SITE_OTHER): Payer: Medicare Other | Admitting: Neurology

## 2019-06-15 ENCOUNTER — Telehealth: Payer: Self-pay

## 2019-06-15 ENCOUNTER — Other Ambulatory Visit: Payer: Self-pay

## 2019-06-15 VITALS — BP 93/64 | HR 61 | Temp 97.2°F | Ht 73.0 in

## 2019-06-15 DIAGNOSIS — R443 Hallucinations, unspecified: Secondary | ICD-10-CM

## 2019-06-15 DIAGNOSIS — G4752 REM sleep behavior disorder: Secondary | ICD-10-CM

## 2019-06-15 DIAGNOSIS — F419 Anxiety disorder, unspecified: Secondary | ICD-10-CM | POA: Diagnosis not present

## 2019-06-15 DIAGNOSIS — F418 Other specified anxiety disorders: Secondary | ICD-10-CM

## 2019-06-15 DIAGNOSIS — G20 Parkinson's disease: Secondary | ICD-10-CM

## 2019-06-15 DIAGNOSIS — G20A1 Parkinson's disease without dyskinesia, without mention of fluctuations: Secondary | ICD-10-CM

## 2019-06-15 MED ORDER — CARBIDOPA-LEVODOPA 25-100 MG PO TABS: ORAL_TABLET | ORAL | 3 refills | Status: DC

## 2019-06-15 MED ORDER — ESCITALOPRAM OXALATE 10 MG PO TABS: 10.0000 mg | ORAL_TABLET | Freq: Every day | ORAL | 3 refills | Status: DC

## 2019-06-15 MED ORDER — CARBIDOPA-LEVODOPA ER 50-200 MG PO TBCR: 2.0000 | EXTENDED_RELEASE_TABLET | Freq: Every day | ORAL | 3 refills | Status: DC

## 2019-06-15 MED ORDER — CLONAZEPAM 0.5 MG PO TBDP: 0.5000 mg | ORAL_TABLET | Freq: Every day | ORAL | 1 refills | Status: DC

## 2019-06-15 MED ORDER — RIVASTIGMINE TARTRATE 4.5 MG PO CAPS: 4.5000 mg | ORAL_CAPSULE | Freq: Two times a day (BID) | ORAL | 3 refills | Status: DC

## 2019-06-15 NOTE — Patient Instructions (Signed)
As discussed, we will continue with your Sinemet immediate release and long-acting as prescribed.  We will also continue with the clonazepam at bedtime.  We will continue with Lexapro once daily.  I do not believe you have been taking the gabapentin.  I have not renewed the prescription in over 1 year.  You can continue with the Exelon generic 4.5 mg twice daily for your memory.  We will try to get you support for Nuplazid as it was too expensive.  Please call [phone], patient assistance for Nuplazid.

## 2019-06-15 NOTE — Progress Notes (Signed)
Subjective:    Patient ID: Brian [name], male    DOB: [birth year], 79 y.o.   MRN: [id number]  HPI    Interim history:   Brian [name] is a 79 year old right-handed gentleman with an underlying medical history of atrial fibrillation, hypertension, OSA, arthritis with bilateral total knee arthroplasties in 2016 and 2017 respectively, and hyperlipidemia who presents for follow-up consultation of his advanced Parkinson's disease, complicated by memory loss, recurrent falls, arthritis, status post bilateral knee replacement surgeries, bladder hyperactivity, hallucinations.  The patient is accompanied by his wife again today.  I last saw him on 02/09/2019, at which time his wife reported that his memory loss was worse and his hallucinations were ongoing.  He had more decline in his mobility.  I suggested we increase his Exelon to 4.5 mg bid.   His wife called in the interim requesting a prescription for Nuplazid.  We also sent another referral to geriatric psychiatry and he was referred to [facility] psychiatry.  Today, 06/15/19 (all dictated new, as well as above notes, some dictation done in note pad or Word, outside of chart, may appear as copied):    He reports very little on his own but does answer questions appropriately.  His wife reports that the Nuplazid was not affordable, approximately $1000 per month which is obviously not doable.  She has help at the house with her cousin living with them but wife also works.  The grandchildren stay with them from time to time.  They have been vaccinated successfully, no issues with both doses.  He has ongoing hallucinations during the day and at night.  He sleeps in a hospital bed with rails up.  He has not tried to climb out of bed but sometimes he wakes up confused and he reports that he sees somebody in bed with him.  She hears him at night.  He does not go to the bathroom at night, he wears depends.  Motor wise he feels stable.  He does not always eat very  much, sometimes he has trouble with larger pills but she breaks them in half.  No issues swallowing the capsules.  Has been tolerating the increase in dose for the Exelon.  He tries to hydrate well, constipation is not a big issue currently.  The patient's allergies, current medications, family history, past medical history, past social history, past surgical history and problem list were reviewed and updated as appropriate.    Previously (copied from previous notes for reference):      I saw him in a virtual visit on 07/06/2018, at which time I suggested we increase his Sinemet to 2 pills 6 times a day.  He was advised to continue with Sinemet CR at bedtime.    In the interim, I ordered Nuplazid for his hallucinations but it was too expensive.  I made a referral to geriatric psychiatry.  The office he was referred to declined the referral.     I saw him on 04/22/2018, at which time he was noted to be more slower and more unsteady. I suggested we increase his Sinemet to from 2 pills 4 times a day to 2 pills 5 times a day. I also suggested we increase his Exelon from 1.5 mg twice a day to 3 mg twice a day.   His wife called in the interim in March 2020, reporting that he was much more lethargic. She was advised to take him to primary care to get checked out for acute medical  problems such as urinary tract infection, dehydration, electrolyte disturbance, upper respiratory infection etc.   I saw him on 12/21/2017, at which time he reported having fallen secondary to right leg giving out. He was having more difficulty walking. He was noted to be more forgetful by his wife. MMSE was 21 at the time and I suggested we start him on Exelon. We kept his other medications the same.    I saw him on 08/10/2013, at which time he was having physical therapy and occupational therapy at home. He had recently been discharged from inpatient rehabilitation at [facility]. He had triceps tendon repair on the right  side in February 2019. I suggested we keep his prescriptions as same including Sinemet, Sinemet CR, Lexapro and clonazepam. I prescribed a U step walker.    I saw him on 05/18/2017, at which time he reported a recent fall with injury to right elbow. He also bumped his head. He fell in the bathroom. He went to the emergency room and I reviewed records at the time. He had a head CT without contrast which showed no acute process, moderate volume loss, moderate chronic small vessel disease. He needed surgery to his right arm under orthopedics.   I saw him on 01/12/2017, at which time he reported to recent falls. He was trying to work out on his own and was supposed to start working with a Physiological scientist.     I saw him on 07/09/2016, at which time he was fairly stable.     I saw him on 03/06/2016 at which time he reported doing better, he had some residual right knee pain. He had an appointment with his orthopedic surgeon. He reported one fall that led to an ER visit via EMS. He also had an admission to the hospital in October 2017 secondary to nausea, vomiting, urinary retention. He was noted to have altered mental status and acute kidney injury. He had a head CT without contrast on 12/31/2015 which showed:    1.  No acute intracranial abnormality. 2. Mild increase in generalized atrophy from 2013. Chronic small vessel ischemia is stable.   He was in the hospital for 4 days, then at [facility] in rehabilitation for about 10 days. He has ongoing issues with particularly right leg stiffness, no additional falls, does not appear to be very motivated to exercise. He had been in the [employer] boxing class. I suggested we continue with Sinemet 2 pills 4 times a day, Sinemet CR at bedtime, gabapentin, clonazepam for RBD at night and the only change was an increase in Lexapro from 5 mg to 10 mg daily.   I saw him on 10/22/2015, at which time he reported worse mobility, more stiffness in the right leg, more freezing  episodes, difficulty getting out of bed especially at night when he would have to go to the bathroom. His wife would have to help him. Thankfully, he had no recent falls and was using a cane. He had finished physical therapy was not using his walker. He was not exercising very much to. He was more sedentary. He had right total knee replacement on 07/12/2015. I suggested we increase his Sinemet to 2 pills 4 times a day. We kept his gabapentin the same. We kept the Sinemet CR at bedtime the same.   I saw him on 04/26/2015, at which time he reported taking carbidopa/levodopa 25-100 milligrams strength, about 7 pills per day, 2 for the 1st dose, 1 1/2 pills for the 2nd  and 4 th dose, 2 for the 3rd dose, and 1 CR at night. He finished physical therapy and was able to walk without a cane. He reported right knee pain and indicated that he may need right knee surgery as well.   I saw him on 01/18/2015, at which time he reported doing better slowly. He had finished home health physical therapy. He was supposed to start outpatient physical therapy through orthopedics. He had left total knee arthroplasty on 11/30/2014. I reviewed the operative note as well as his discharge summary. He was discharged on 12/03/2014 to inpatient rehabilitation. Of note, he presented to the emergency room on 12/16/2014 secondary to repeated vomiting. This was deemed secondary to constipation. Cardiac enzymes were tested which were negative for any acute coronary syndrome. I reviewed the emergency room records. He had blood work which I reviewed. INR was 2.59. Hemoglobin was slightly low at 11.9 and hematocrit was 34.6. Troponin was negative 2. He overall felt a little better since we increased his Sinemet. He was taking a long-acting Sinemet CR at night. He had some off time in the middle of the night.  He felt he was making slow progress after his knee replacement surgery. He had some issues with edema. He was wearing compression stockings  for this. He was also placed on furosemide for a little while. He was no longer on narcotic pain medications. He was using a cane at the time. I suggested we continue with his medication regimen, he was alternating Sinemet 2 pills with 1-1/2 pills for a total of 4 doses a day.   He missed an appointment on 12/19/2014. I saw him on 08/22/2014, at which time he reported bilateral knee pain. He had seen Dr. Tonita Cong for his knee arthritis and had undergone injections into both knees. He did not think the injections helped a lot. He was exploring knee replacement surgery. He was having more difficulty getting out of chairs. He had no freezing. He did think that the increase in Sinemet had helped in the past. He was alternating 1 pill with 1-1/2 pills. He was taking Sinemet CR at night. He was on gabapentin 100 mg 3 times a day and Lexapro 5 mg daily as well as clonazepam 0.5 mg each night. He had no new memory or mood issues. He was overall moving slower. He had no recent falls.   In the interim, his wife called on 01/04/2015 reported that he had worsening symptoms. I suggested we increase his Sinemet slightly to 2 tablets alternating with 1-1/2 tablets.   I saw him on 06/01/2014, at which time he reported overall doing well. He was not able to sleep through the night. He was consistently waking up between 3 and 4 AM. He had nocturia twice on average. He felt improved from the bladder medication but still reported disrupted sleep. He felt clonazepam has helped. His wife agreed. He also felt that the increase in Sinemet was helpful in his motor function. It was difficult for him to keep up with the 5 pills a day schedule. He was going to the [facility] 3 times a week for about 30 minutes. Lexapro has helped his mood. His wife felt that he was less anxious. I suggested we increase his clonazepam to 0.5 mg each night. I suggested we change his Sinemet to 1 pill alternating with one half pills for a total of 5 pills daily but  for doses. I suggested he continue with Sinemet CR at bedtime.   I  saw him on 03/27/2014, at which time he reported sleeping a little bit better but his tremor was worse. He did not feel Sinemet was lasting him 4 hours in between 2 different doses. He was going to the gym twice a week. He was back on an antidepressant. He was supposed to start a new bladder medication. He never actually started clonazepam for RBD. I prescribed clonazepam for him for RBD. I increased his Sinemet to one fill 5 times a day.   I saw him on 12/01/2013, at which time he reported, that he stopped Myrbetric due to new onset facial swelling, but this was no better after stopping it. His facial swelling may have started after the lexapro. Adding long-acting Sinemet helped him sleep a little bit longer up to 3 AM. I suggested he stop Lexapro because of a possible allergic reaction to it. I also added low-dose clonazepam for his RBD and for his sleep.   I saw him on 09/29/2013 at which time he presented for a sooner than scheduled appointment because of increasing tremors and parkinsonian symptoms. He also felt more anxious but not frankly depressed. His wife felt that he may have been depressed. She reported dream enactments. She also reported loud snoring. He had a sleep study over 5 years ago, and that was negative for OSA at the time. He had started Myrbetriq recently. He has been taking gabapentin 3 times a day, and 7, 11 and 3. He takes Sinemet regularly at 7, 11, 3, and 7. I suggested that he start low-dose Lexapro at 5 mg strength. I changed the timing of his gabapentin to one pill at 7 AM, 1 pill at 11 AM and one pill at 7 PM. I asked him to continue with Sinemet 4 times a day. I asked him to add a long-acting Sinemet CR 50/200 mg strength once daily at bed time. I suggested he return for a sleep study. He had a diagnostic polysomnogram on 11/09/2013 and I went over his test results with him in detail today. Sleep efficiency was  reduced at 58.7% with a latency to sleep of 82 minutes and wake after sleep onset of 117 minutes with mild to moderate sleep fragmentation noted. He had an increased percentage of light stage sleep, absence of deep sleep and 16.8% of dream sleep with a prolonged REM latency. He had mild to moderate snoring. Total AHI was 3.2 per hour, he had some lack of REM atonia.   I saw him on 05/31/2013, at which time I felt he was fairly stable. I kept him on Sinemet 4 times a day and gabapentin 100 mg 3 times a day. He reported, going up to 4 times a day with his Sinemet helped. He reported no side effects with gabapentin or Sinemet and seemed to tolerate them well. He had some residual facial tingling which was tolerable to him. He has LBP and went to [facility]. He had an Xray of the back and was told there was degenerative disease and was given and was given a 3 week taper of oral steroids, which helped. He had stopped exercising d/t back pain and was scheduled for PT evaluation through ortho. He has been on metamucil for constipation which helped.   I saw him on 11/30/2012, at which time I suggested that he continue gabapentin 100 mg 1 pill 3 times a day for his paresthesias and encouraged him to continue Sinemet with 25/100 mg strength one tablet 4 times a day, at 7,  11, 3 PM and 7 PM.     I first met him on 06/22/2012 and he presented on 08/23/2012 for a sooner than scheduled appointment because he felt his medication was not helpful. He previously followed with Dr. Morene Antu and had been complaining of paresthesias. Dr. Erling Cruz had started him on low-dose gabapentin. For his parkinsonism which was first noted in June of 2012 he was tried on pramipexole which helped, but at his first visit with me he told me that his gabapentin was not helpful and he also discontinued pramipexole a few months prior because of swelling in his feet and ankles. This improved after stopping both medications. At the time of his first  visit with me I felt he had mild parkinsonism without much in the way of lateralization. I started him on gabapentin again because of his paresthesias and also asked him to start low-dose Sinemet. He called back stating that the Sinemet was not helpful. He requested a sooner appointment. He had been continuing taking gabapentin 100 mg 3 times a day and Sinemet one pill 3 times a day with minimal improvement as he reported last time. I suggested at that visit that he increase his Sinemet to one pill 4 times a day and continue with gabapentin 100 mg 3 times a day. I also felt that he may have right sided predominant idiopathic Parkinson's disease due to an intermittent tremor noted only on the right side.    His Past Medical History Is Significant For: Past Medical History:  Diagnosis Date  . Abnormal PFT 1. 05/18/08  2. 11/30/08   1. Showed mild airflow obstruction, mild restriction, mild diffusion defect; FEV1 2.22(64%), FVC 3.33(65%), FEVi% 67, TLC 5.19(69%), DLCO 77%, +BD  2. FEV1 2.38(73%), FVC 3.81(80%), FEV1% 63, TLC 5.61(80%), DLCO 79%, no BD  . Allergic rhinitis   . Anxiety   . BPH (benign prostatic hyperplasia)   . Cellulitis    right leg MRSA  . Erectile dysfunction   . GERD (gastroesophageal reflux disease)   . Heart murmur    mild MR by echo  . Hyperlipidemia   . Hypertension   . Osteoarthritis   . Paresthesias 06/22/2012  . Parkinsonism ([place]) 06/22/2012  . Permanent atrial fibrillation ([place])   . Restrictive lung disease     His Past Surgical History Is Significant For: Past Surgical History:  Procedure Laterality Date  . CARDIAC CATHETERIZATION  11/2009   normal coronary arteries  . CARDIOVERSION     multiple  . CATARACT EXTRACTION    . COLONOSCOPY    . COLONOSCOPY WITH PROPOFOL N/A 09/02/2016   Procedure: COLONOSCOPY WITH PROPOFOL;  Surgeon: Garlan Fair, MD;  Location: [facility] END[facility]COPY;  Service: Endoscopy;  Laterality: N/A;  . EYE SURGERY     bilateral cataract extraction    . JOINT REPLACEMENT    . TOTAL KNEE ARTHROPLASTY Left 11/30/2014   Procedure: TOTAL KNEE ARTHROPLASTY;  Surgeon: Susa Day, MD;  Location: [facility];  Service: Orthopedics;  Laterality: Left;  . TOTAL KNEE ARTHROPLASTY Right 07/12/2015   Procedure: RIGHT TOTAL KNEE ARTHROPLASTY;  Surgeon: Susa Day, MD;  Location: [facility] OR[facility];  Service: Orthopedics;  Laterality: Right;  . TRICEPS TENDON REPAIR Right 05/20/2017  . TRICEPS TENDON REPAIR Right 05/20/2017   Procedure: TRICEPS TENDON REPAIR;  Surgeon: Iran Planas, MD;  Location: [facility];  Service: Orthopedics;  Laterality: Right;    His Family History Is Significant For: Family History  Problem Relation Age of Onset  . Diabetes Mother  DM  . Stroke Father        CVA  . Pancreatic cancer Brother   . Pancreatic cancer Other        Nephew    His Social History Is Significant For: Social History   Socioeconomic History  . Marital status: Married    Spouse name: [name]  . Number of children: 4  . Years of education: masters  . Highest education level: Not on file  Occupational History  . Occupation: Chief of Staff    Comment: Worked with [employer]  Tobacco Use  . Smoking status: Never Smoker  . Smokeless tobacco: Never Used  Substance and Sexual Activity  . Alcohol use: No    Alcohol/week: 0.0 standard drinks  . Drug use: No  . Sexual activity: Not on file  Other Topics Concern  . Not on file  Social History Narrative   Married and lives in [place].  Retired Optometrist.   No reported caffeine use.    Social Determinants of Health   Financial Resource Strain:   . Difficulty of Paying Living Expenses:   Food Insecurity:   . Worried About Charity fundraiser in the Last Year:   . Arboriculturist in the Last Year:   Transportation Needs:   . Film/video editor (Medical):   Marland Kitchen Lack of Transportation (Non-Medical):   Physical Activity:   . Days of Exercise per Week:   . Minutes of Exercise per Session:    Stress:   . Feeling of Stress :   Social Connections:   . Frequency of Communication with Friends and Family:   . Frequency of Social Gatherings with Friends and Family:   . Attends Religious Services:   . Active Member of Clubs or Organizations:   . Attends Archivist Meetings:   Marland Kitchen Marital Status:     His Allergies Are:  Allergies  Allergen Reactions  . Lisinopril Cough  :   His Current Medications Are:  Outpatient Encounter Medications as of 06/15/2019  Medication Sig  . ammonium lactate (LAC-HYDRIN) 12 % cream Apply topically as needed for dry skin. As prescribed  . apixaban (ELIQUIS) 5 MG TABS tablet Take 1 tablet (5 mg total) by mouth 2 (two) times daily.  . carbidopa-levodopa (SINEMET CR) 50-200 MG tablet Take 2 tablets by mouth at bedtime.  . carbidopa-levodopa (SINEMET IR) 25-100 MG tablet 2 pills 6 times a day, starting at 7 AM, every 2 1/2 hours  . cholecalciferol (VITAMIN D) 1000 units tablet Take 1,000 Units by mouth daily. 7 am  . clonazePAM (KLONOPIN) 0.5 MG disintegrating tablet Take 1 tablet (0.5 mg total) by mouth at bedtime.  Marland Kitchen doxazosin (CARDURA) 2 MG tablet Take 2 mg by mouth at bedtime.   Marland Kitchen escitalopram (LEXAPRO) 10 MG tablet Take 1 tablet (10 mg total) by mouth at bedtime.  . famotidine (PEPCID) 40 MG tablet Take 40 mg by mouth every evening. 7 pm  . finasteride (PROSCAR) 5 MG tablet Take 5 mg by mouth daily.  Marland Kitchen FLUAD 0.5 ML SUSY ADM 0.5ML IM UTD  . furosemide (LASIX) 40 MG tablet Take 40 mg by mouth 2 (two) times daily.  Marland Kitchen gabapentin (NEURONTIN) 100 MG capsule Take 1 capsule (100 mg total) by mouth 3 (three) times daily.  Marland Kitchen HYDROcodone-acetaminophen (NORCO/VICODIN) 5-325 MG tablet Take 1 tablet by mouth every 4 (four) hours as needed.  Marland Kitchen losartan (COZAAR) 50 MG tablet Take 50 mg by mouth every morning.  . metoprolol  succinate (TOPROL-XL) 25 MG 24 hr tablet Take 25 mg by mouth every evening. 7 pm  . Multiple Vitamin (MULTIVITAMIN WITH MINERALS)  TABS tablet Take 1 tablet by mouth daily. 7 am  . potassium chloride (KLOR-CON 10) 10 MEQ tablet Klor-Con 10 mEq tablet,extended release  . pravastatin (PRAVACHOL) 40 MG tablet Take 40 mg by mouth every morning.   . sodium chloride (OCEAN) 0.65 % SOLN nasal spray Place 1 spray into both nostrils daily as needed for congestion.  . tamsulosin (FLOMAX) 0.4 MG CAPS capsule Take 0.4 mg at bedtime by mouth.  Marland Kitchen UNABLE TO FIND Take by mouth.  . vitamin C (ASCORBIC ACID) 500 MG tablet Take 500 mg by mouth every morning.  . [DISCONTINUED] carbidopa-levodopa (SINEMET CR) 50-200 MG tablet TAKE 2 TABLETS BY MOUTH AT BEDTIME  . [DISCONTINUED] carbidopa-levodopa (SINEMET IR) 25-100 MG tablet 2 pills 6 times a day, starting at 7 AM, every 2 1/2 hours  . [DISCONTINUED] clonazePAM (KLONOPIN) 0.5 MG disintegrating tablet DISSOLVE 1 TABLET IN MOUTH AT BEDTIME  . [DISCONTINUED] escitalopram (LEXAPRO) 10 MG tablet Take 1 tablet (10 mg total) by mouth at bedtime.  . [DISCONTINUED] rivastigmine (EXELON) 1.5 MG capsule Take 1.5 mg by mouth 2 (two) times daily.  . rivastigmine (EXELON) 4.5 MG capsule Take 1 capsule (4.5 mg total) by mouth 2 (two) times daily.  . [DISCONTINUED] penicillin v potassium (VEETID) 500 MG tablet Take 500 mg by mouth every 4 (four) hours.  . [DISCONTINUED] Pimavanserin Tartrate (NUPLAZID) 34 MG CAPS Take 1 capsule (34 mg total) by mouth daily. (Patient not taking: Reported on 06/15/2019)  . [DISCONTINUED] potassium chloride (K-DUR,KLOR-CON) 10 MEQ tablet Take 10 mEq by mouth 2 (two) times daily.   . [DISCONTINUED] rivastigmine (EXELON) 4.5 MG capsule Take 1 capsule (4.5 mg total) by mouth 2 (two) times daily. (Patient not taking: Reported on 06/15/2019)   No facility-administered encounter medications on file as of 06/15/2019.  :  Review of Systems:  Out of a complete 14 point review of systems, all are reviewed and negative with the exception of these symptoms as listed below:  Review of  Systems  Musculoskeletal: Positive for gait problem.  Neurological: Positive for tremors.       Memory issues        Objective:   Physical Exam        Physical Examination:   Vitals:   06/15/19 1052  BP: 93/64  Pulse: 61  Temp: (!) 97.2 F (36.2 C)   General Examination: The patient is a very pleasant 79 y.o. male in no acute distress. He appears frail, well groomed, in [facility].   HEENT:Normocephalic, atraumatic, pupils are equal, round and reactive to light, s/p cataract repairs. Extraocular tracking shows moderatesaccadic breakdown.Moderate to severe nuclear rigidity is noted with decrease in passive ROM. No lip, neck or jaw tremor is noted.He has mod hypophonia,milddysarthria noted.More anterocollis.Oropharynx examination reveals no new findings, except moderate mouth dryness. Tongue is central, palate symmetrical.Mild sialorrhea.  Chest:Clear to auscultation without wheezing, rhonchi or crackles noted.  Heart:S1+S2+0, regular and normal without murmurs, rubs or gallops noted.   Abdomen:Soft, non-tender and non-distended.  Extremities:There isnoedema in the distal lower extremities bilaterally.   Skin: Warm and dry without trophic changes noted.  Musculoskeletal: exam reveals no obvious joint deformities, tenderness or joint swelling or erythema, well healedscars from TKAs b/l.no obvious swelling, no pain, slight decrease inleg extension range of motion bilaterally.   Neurologically:  Mental status: The patient is awake, alert and oriented to  except exact date, month, season. Hisimmediate and remote memory, attention, language skills and fund of knowledge areimpaired with some slowness in thinking.Mood is normaland affect is normal.  On9/30/2019: MMSE: 21/28, CDT: could not draw. AFT: 11/min.  On1/30/2020: MMSE: 23/28, cannot draw, CDT: Cannot draw a clock, AFT: 8/min.  On 02/09/2019: MMSE: 18/30, Cannot draw, cannot draw clock, AFT:  5/min.  On 06/15/2019: MMSE: 17/30, CDT: 1/4, AFT: 10/min.  Cranial nerves II - XII are as described above under HEENT exam. Motor exam: Normal bulk,globalstrengthof 4/5, No restingtremor or rebound. Romberg is not tested for safetyreasons.Reflexes arenot tested today. Fine motor exam shows severe impairment on the right, moderate to severe impairment on the left.severe slowness noted today. No obvious resting tremor. Cerebellar testing: No dysmetria or intention tremor.  Sensory exam: intact to light touch in the upper and lower extremities.  Gait, station and balance:I did not have him stand or walk for me today, as he is in a wheelchair and no walker is available.  Assessment and Plan:   In summary, Brian [name] a very pleasant 79 year old male with a history of atrial fibrillation, hypertension, hyperlipidemia, who presents for followup consultation of his right-sided predominant,Parkinson's disease, akinetic-rigid type, complicated by Recurrent falls, memory loss, anxiety, depression, sleep disturbance, arthritis with status post bilateral knee replacement surgeries constipation and hallucinations.  Nuplazid was not affordable unfortunately.  We will look into patient support.  He has had decline in his mobility over time and has fallen.  He has been able to tolerate Exelon 4.5 mg twice daily. We mutually agreed to keep his other medications the same including Sinemet IR, Sinemet CR, clonazepam, Lexapro and gabapentin. He has not seen psychiatry. We increased the lexapro generic to 10 mg in Dec. 2017.I suggested a 14-monthrecheck. I renewed his prescriptions. I answered all the questions today and the patient and his wife were in agreement.   I spent 30 minutes in total face-to-face time and in reviewing records during pre-charting, more than 50% of which was spent in counseling and coordination of care, reviewing test results, reviewing medications and treatment regimen and/or in  discussing or reviewing the diagnosis of PD, the prognosis and treatment options. Pertinent laboratory and imaging test results that were available during this visit with the patient were reviewed by me and considered in my medical decision making (see chart for details).

## 2019-06-15 NOTE — Telephone Encounter (Signed)
Dr.Athar stated the pt and wife want to try applying for patient assistance with [employer]. I call [employer] at [phone] to explain pts co payment was over a 1000. I gave them the pts insurance which is medicare part D. The rep stated if pt has medicare they will have to call [phone] to apply for another type of assistance. The pt had an office visit today and his wife was given the number of [phone] to call for assistance. It was also put in the avs. The wife was appreciate and verbalized understanding.

## 2019-06-21 NOTE — Telephone Encounter (Signed)
Rep with [employer] called to inform they have provided copay assistance information to the pt's spouse as they were concerned about out of pocket costs. Pt was hesistant to being medication until he knew of financial options which rep provided  Cb#[phone]

## 2019-06-21 NOTE — Telephone Encounter (Signed)
Left vm for Jim at [employer] to get more details on his phone call.

## 2019-06-21 NOTE — Telephone Encounter (Signed)
Thank you for the update. Will await for patient's wife to call back with decision to proceed with Nuplazid.

## 2019-06-21 NOTE — Telephone Encounter (Signed)
I called [name] with [employer] and he stated pts wife has all of the information for financial options and the private companies for her to contact for their co pay price. Message will be sent to Dr. Rexene Alberts as a FYI.

## 2019-06-27 ENCOUNTER — Telehealth: Payer: Self-pay | Admitting: Neurology

## 2019-06-27 NOTE — Telephone Encounter (Signed)
A rep from [employer] called and LVM stating he is needing to speak to the RN about the pt's Nuplazid. Pleaese advise. [phone] option 3

## 2019-06-27 NOTE — Telephone Encounter (Signed)
I called [facility] back, they needed ICD-10 codes, which I provided.  I called pt' wife, per DPR. They are also working with [facility] to obtain Nuplazid at hopefully an affordable price. Pt's wife will let us know of any questions or concerns.

## 2019-06-28 NOTE — Telephone Encounter (Signed)
Brian [name] from [employer] called stating that a medication list needs to be faxed over for the pt. Please fax with pt name and dob to [phone]

## 2019-06-28 NOTE — Telephone Encounter (Signed)
Med list faxed to [facility] as requested. Received a receipt of confirmation.

## 2019-07-25 ENCOUNTER — Encounter: Payer: Self-pay | Admitting: Neurology

## 2019-08-02 ENCOUNTER — Ambulatory Visit: Payer: Medicare Other | Admitting: Podiatry

## 2019-08-10 ENCOUNTER — Other Ambulatory Visit: Payer: Self-pay

## 2019-08-10 ENCOUNTER — Encounter: Payer: Self-pay | Admitting: Podiatry

## 2019-08-10 ENCOUNTER — Ambulatory Visit (INDEPENDENT_AMBULATORY_CARE_PROVIDER_SITE_OTHER): Payer: Medicare Other | Admitting: Podiatry

## 2019-08-10 VITALS — Temp 97.3°F

## 2019-08-10 DIAGNOSIS — N182 Chronic kidney disease, stage 2 (mild): Secondary | ICD-10-CM

## 2019-08-10 DIAGNOSIS — B351 Tinea unguium: Secondary | ICD-10-CM | POA: Diagnosis not present

## 2019-08-10 DIAGNOSIS — M79676 Pain in unspecified toe(s): Secondary | ICD-10-CM

## 2019-08-10 DIAGNOSIS — D689 Coagulation defect, unspecified: Secondary | ICD-10-CM | POA: Diagnosis not present

## 2019-08-10 NOTE — Progress Notes (Signed)
This patient returns to my office for at risk foot care.  This patient requires this care by a professional since this patient will be at risk due to having coagulation defect.  Patient is taking eliquis. Patient has not been seen in over 16 months.  He presents to the office with his wife.   This patient is unable to cut nails himself since the patient cannot reach his nails.These nails are painful walking and wearing shoes.  This patient presents for at risk foot care today.  General Appearance  Alert, conversant and in no acute stress.  Vascular  Dorsalis pedis and posterior tibial  pulses are palpable  bilaterally.  Capillary return is within normal limits  bilaterally. Temperature is within normal limits  bilaterally.  Neurologic  Senn-Weinstein monofilament wire test within normal limits  bilaterally. Muscle power within normal limits bilaterally.  Nails Thick disfigured discolored nails with subungual debris  from hallux to fifth toes bilaterally. No evidence of bacterial infection or drainage bilaterally.  Orthopedic  No limitations of motion  feet .  No crepitus or effusions noted.  No bony pathology or digital deformities noted.  Skin  normotropic skin with no porokeratosis noted bilaterally.  No signs of infections or ulcers noted.     Onychomycosis  Pain in right toes  Pain in left toes  Consent was obtained for treatment procedures.   Mechanical debridement of nails 1-5  bilaterally performed with a nail nipper.  Filed with dremel without incident.    Return office visit    3 months                  Told patient to return for periodic foot care and evaluation due to potential at risk complications.   Gardiner Barefoot DPM

## 2019-08-17 ENCOUNTER — Encounter: Payer: Self-pay | Admitting: Neurology

## 2019-08-17 ENCOUNTER — Telehealth (INDEPENDENT_AMBULATORY_CARE_PROVIDER_SITE_OTHER): Payer: Medicare Other | Admitting: Neurology

## 2019-08-17 DIAGNOSIS — F0280 Dementia in other diseases classified elsewhere without behavioral disturbance: Secondary | ICD-10-CM

## 2019-08-17 DIAGNOSIS — G20 Parkinson's disease: Secondary | ICD-10-CM

## 2019-08-17 DIAGNOSIS — G4752 REM sleep behavior disorder: Secondary | ICD-10-CM | POA: Diagnosis not present

## 2019-08-17 DIAGNOSIS — F419 Anxiety disorder, unspecified: Secondary | ICD-10-CM

## 2019-08-17 DIAGNOSIS — R443 Hallucinations, unspecified: Secondary | ICD-10-CM | POA: Diagnosis not present

## 2019-08-17 MED ORDER — RIVASTIGMINE TARTRATE 4.5 MG PO CAPS: 4.5000 mg | ORAL_CAPSULE | Freq: Two times a day (BID) | ORAL | 3 refills | Status: DC

## 2019-08-17 NOTE — Patient Instructions (Signed)
Given verbally, during today's virtual video-based encounter, with verbal feedback received.   

## 2019-08-17 NOTE — Progress Notes (Signed)
Interim history:  Brian [name] is a 79 year old right-handed gentleman with an underlying medical history of atrial fibrillation, hypertension, OSA, arthritis with bilateral total knee arthroplasties in 2016 and 2017 respectively, and hyperlipidemia who presents for A virtual visit via [place] video visit for follow-up consultation of his advanced Parkinson's disease, complicated by memory loss, recurrent falls, arthritis, status post bilateral knee replacement surgeries, bladder hyperactivity, hallucinations.  The patient is accompanied by his wife again today. They are located in their home, using her phone, I am located in my office. I last saw him on 06/15/2019, at which time his wife reported that Nuplazid was not affordable, we tried in the interim to get them some support through the [employer] but it was not doable.  She was advised to continue with his medications.  Today, 08/17/2019: His wife requested a virtual visit as he was too lethargic and she was unable to bring him to the office today.  She reports that he has gradually become more more lethargic, it is difficult for him to move and transfer safely.  In the past 2+ months he has fallen 4 times, thankfully without any major injuries but typically falls when he tries to get in or out of his wheelchair.  He is hardly able to use his walker any longer.  She is requesting home health physical therapy.  She does have some help for about 1 or 1-1/2 hours each morning, Monday through Saturdays to help him get ready and change in [place] and get him to the breakfast table.  In the evening she is on her own to help him.  He requires assistance with all ADLs.  His memory has become worse, sometimes he does not recognize her, he has ongoing hallucinations and conversations with people that are not there.  Sometimes he gets irritated and angry even.  She tries to help him and calm him down as much as possible and usually his irritation and delusions  subside. In the process of going over his medications we realized today that he has been taking the 1.5 mg Exelon twice daily. She was giving him to at bedtime. He was supposed to be on 4.5 mg twice daily. At one point we increased it to 3 mg twice daily and back in November 2020 we increase it to 4.5 mg twice daily. Somewhere between our March appointment in early May he has been on 1.5 mg, 2 pills at bedtime. We mutually agreed to increase it back to 4.5 mg and I will take out the prescription for the 1.5 mg. It is possible that his pharmacy had both prescriptions on file. He also takes gabapentin 100 mg 3 times daily and we mutually agreed to taper this off to see if his lethargy improves. She has noticed that he does not hear very well, probably out of his right ear and she would like to schedule a appointment to test his hearing. She would like to wait until he has established home health physical therapy to see if his mobility and transferring improved just enough for him to get out for an appointment.   The patient's allergies, current medications, family history, past medical history, past social history, past surgical history and problem list were reviewed and updated as appropriate.    Previously (copied from previous notes for reference):   I saw him on 02/09/2019, at which time his wife reported that his memory loss was worse and his hallucinations were ongoing.  He had more decline  in his mobility.  I suggested we increase his Exelon to 4.5 mg bid.    His wife called in the interim requesting a prescription for Nuplazid.  We also sent another referral to geriatric psychiatry and he was referred to [facility] psychiatry.    I saw him in a virtual visit on 07/06/2018, at which time I suggested we increase his Sinemet to 2 pills 6 times a day.  He was advised to continue with Sinemet CR at bedtime.    In the interim, I ordered Nuplazid for his hallucinations but it was too expensive.  I made a  referral to geriatric psychiatry.  The office he was referred to declined the referral.     I saw him on 04/22/2018, at which time he was noted to be more slower and more unsteady. I suggested we increase his Sinemet to from 2 pills 4 times a day to 2 pills 5 times a day. I also suggested we increase his Exelon from 1.5 mg twice a day to 3 mg twice a day.   His wife called in the interim in March 2020, reporting that he was much more lethargic. She was advised to take him to primary care to get checked out for acute medical problems such as urinary tract infection, dehydration, electrolyte disturbance, upper respiratory infection etc.   I saw him on 12/21/2017, at which time he reported having fallen secondary to right leg giving out. He was having more difficulty walking. He was noted to be more forgetful by his wife. MMSE was 21 at the time and I suggested we start him on Exelon. We kept his other medications the same.    I saw him on 08/10/2013, at which time he was having physical therapy and occupational therapy at home. He had recently been discharged from inpatient rehabilitation at [facility]. He had triceps tendon repair on the right side in February 2019. I suggested we keep his prescriptions as same including Sinemet, Sinemet CR, Lexapro and clonazepam. I prescribed a U step walker.    I saw him on 05/18/2017, at which time he reported a recent fall with injury to right elbow. He also bumped his head. He fell in the bathroom. He went to the emergency room and I reviewed records at the time. He had a head CT without contrast which showed no acute process, moderate volume loss, moderate chronic small vessel disease. He needed surgery to his right arm under orthopedics.   I saw him on 01/12/2017, at which time he reported to recent falls. He was trying to work out on his own and was supposed to start working with a Physiological scientist.     I saw him on 07/09/2016, at which time he was fairly  stable.     I saw him on 03/06/2016 at which time he reported doing better, he had some residual right knee pain. He had an appointment with his orthopedic surgeon. He reported one fall that led to an ER visit via EMS. He also had an admission to the hospital in October 2017 secondary to nausea, vomiting, urinary retention. He was noted to have altered mental status and acute kidney injury. He had a head CT without contrast on 12/31/2015 which showed:    1.  No acute intracranial abnormality. 2. Mild increase in generalized atrophy from 2013. Chronic small vessel ischemia is stable.   He was in the hospital for 4 days, then at [facility] in rehabilitation for about 10 days. He  has ongoing issues with particularly right leg stiffness, no additional falls, does not appear to be very motivated to exercise. He had been in the [employer] boxing class. I suggested we continue with Sinemet 2 pills 4 times a day, Sinemet CR at bedtime, gabapentin, clonazepam for RBD at night and the only change was an increase in Lexapro from 5 mg to 10 mg daily.   I saw him on 10/22/2015, at which time he reported worse mobility, more stiffness in the right leg, more freezing episodes, difficulty getting out of bed especially at night when he would have to go to the bathroom. His wife would have to help him. Thankfully, he had no recent falls and was using a cane. He had finished physical therapy was not using his walker. He was not exercising very much to. He was more sedentary. He had right total knee replacement on 07/12/2015. I suggested we increase his Sinemet to 2 pills 4 times a day. We kept his gabapentin the same. We kept the Sinemet CR at bedtime the same.   I saw him on 04/26/2015, at which time he reported taking carbidopa/levodopa 25-100 milligrams strength, about 7 pills per day, 2 for the 1st dose, 1 1/2 pills for the 2nd and 4 th dose, 2 for the 3rd dose, and 1 CR at night. He finished physical therapy and was able to  walk without a cane. He reported right knee pain and indicated that he may need right knee surgery as well.   I saw him on 01/18/2015, at which time he reported doing better slowly. He had finished home health physical therapy. He was supposed to start outpatient physical therapy through orthopedics. He had left total knee arthroplasty on 11/30/2014. I reviewed the operative note as well as his discharge summary. He was discharged on 12/03/2014 to inpatient rehabilitation. Of note, he presented to the emergency room on 12/16/2014 secondary to repeated vomiting. This was deemed secondary to constipation. Cardiac enzymes were tested which were negative for any acute coronary syndrome. I reviewed the emergency room records. He had blood work which I reviewed. INR was 2.59. Hemoglobin was slightly low at 11.9 and hematocrit was 34.6. Troponin was negative 2. He overall felt a little better since we increased his Sinemet. He was taking a long-acting Sinemet CR at night. He had some off time in the middle of the night.  He felt he was making slow progress after his knee replacement surgery. He had some issues with edema. He was wearing compression stockings for this. He was also placed on furosemide for a little while. He was no longer on narcotic pain medications. He was using a cane at the time. I suggested we continue with his medication regimen, he was alternating Sinemet 2 pills with 1-1/2 pills for a total of 4 doses a day.   He missed an appointment on 12/19/2014. I saw him on 08/22/2014, at which time he reported bilateral knee pain. He had seen Dr. Tonita Cong for his knee arthritis and had undergone injections into both knees. He did not think the injections helped a lot. He was exploring knee replacement surgery. He was having more difficulty getting out of chairs. He had no freezing. He did think that the increase in Sinemet had helped in the past. He was alternating 1 pill with 1-1/2 pills. He was taking  Sinemet CR at night. He was on gabapentin 100 mg 3 times a day and Lexapro 5 mg daily as well as clonazepam 0.5  mg each night. He had no new memory or mood issues. He was overall moving slower. He had no recent falls.   In the interim, his wife called on 01/04/2015 reported that he had worsening symptoms. I suggested we increase his Sinemet slightly to 2 tablets alternating with 1-1/2 tablets.   I saw him on 06/01/2014, at which time he reported overall doing well. He was not able to sleep through the night. He was consistently waking up between 3 and 4 AM. He had nocturia twice on average. He felt improved from the bladder medication but still reported disrupted sleep. He felt clonazepam has helped. His wife agreed. He also felt that the increase in Sinemet was helpful in his motor function. It was difficult for him to keep up with the 5 pills a day schedule. He was going to the [facility] 3 times a week for about 30 minutes. Lexapro has helped his mood. His wife felt that he was less anxious. I suggested we increase his clonazepam to 0.5 mg each night. I suggested we change his Sinemet to 1 pill alternating with one half pills for a total of 5 pills daily but for doses. I suggested he continue with Sinemet CR at bedtime.   I saw him on 03/27/2014, at which time he reported sleeping a little bit better but his tremor was worse. He did not feel Sinemet was lasting him 4 hours in between 2 different doses. He was going to the gym twice a week. He was back on an antidepressant. He was supposed to start a new bladder medication. He never actually started clonazepam for RBD. I prescribed clonazepam for him for RBD. I increased his Sinemet to one fill 5 times a day.   I saw him on 12/01/2013, at which time he reported, that he stopped Myrbetric due to new onset facial swelling, but this was no better after stopping it. His facial swelling may have started after the lexapro. Adding long-acting Sinemet helped him sleep a  little bit longer up to 3 AM. I suggested he stop Lexapro because of a possible allergic reaction to it. I also added low-dose clonazepam for his RBD and for his sleep.   I saw him on 09/29/2013 at which time he presented for a sooner than scheduled appointment because of increasing tremors and parkinsonian symptoms. He also felt more anxious but not frankly depressed. His wife felt that he may have been depressed. She reported dream enactments. She also reported loud snoring. He had a sleep study over 5 years ago, and that was negative for OSA at the time. He had started Myrbetriq recently. He has been taking gabapentin 3 times a day, and 7, 11 and 3. He takes Sinemet regularly at 7, 11, 3, and 7. I suggested that he start low-dose Lexapro at 5 mg strength. I changed the timing of his gabapentin to one pill at 7 AM, 1 pill at 11 AM and one pill at 7 PM. I asked him to continue with Sinemet 4 times a day. I asked him to add a long-acting Sinemet CR 50/200 mg strength once daily at bed time. I suggested he return for a sleep study. He had a diagnostic polysomnogram on 11/09/2013 and I went over his test results with him in detail today. Sleep efficiency was reduced at 58.7% with a latency to sleep of 82 minutes and wake after sleep onset of 117 minutes with mild to moderate sleep fragmentation noted. He had an increased percentage of  light stage sleep, absence of deep sleep and 16.8% of dream sleep with a prolonged REM latency. He had mild to moderate snoring. Total AHI was 3.2 per hour, he had some lack of REM atonia.   I saw him on 05/31/2013, at which time I felt he was fairly stable. I kept him on Sinemet 4 times a day and gabapentin 100 mg 3 times a day. He reported, going up to 4 times a day with his Sinemet helped. He reported no side effects with gabapentin or Sinemet and seemed to tolerate them well. He had some residual facial tingling which was tolerable to him. He has LBP and went to [facility]. He  had an Xray of the back and was told there was degenerative disease and was given and was given a 3 week taper of oral steroids, which helped. He had stopped exercising d/t back pain and was scheduled for PT evaluation through ortho. He has been on metamucil for constipation which helped.   I saw him on 11/30/2012, at which time I suggested that he continue gabapentin 100 mg 1 pill 3 times a day for his paresthesias and encouraged him to continue Sinemet with 25/100 mg strength one tablet 4 times a day, at 7, 11, 3 PM and 7 PM.     I first met him on 06/22/2012 and he presented on 08/23/2012 for a sooner than scheduled appointment because he felt his medication was not helpful. He previously followed with Dr. Morene Antu and had been complaining of paresthesias. Dr. Erling Cruz had started him on low-dose gabapentin. For his parkinsonism which was first noted in June of 2012 he was tried on pramipexole which helped, but at his first visit with me he told me that his gabapentin was not helpful and he also discontinued pramipexole a few months prior because of swelling in his feet and ankles. This improved after stopping both medications. At the time of his first visit with me I felt he had mild parkinsonism without much in the way of lateralization. I started him on gabapentin again because of his paresthesias and also asked him to start low-dose Sinemet. He called back stating that the Sinemet was not helpful. He requested a sooner appointment. He had been continuing taking gabapentin 100 mg 3 times a day and Sinemet one pill 3 times a day with minimal improvement as he reported last time. I suggested at that visit that he increase his Sinemet to one pill 4 times a day and continue with gabapentin 100 mg 3 times a day. I also felt that he may have right sided predominant idiopathic Parkinson's disease due to an intermittent tremor noted only on the right side.     His Past Medical History Is Significant For: Past  Medical History:  Diagnosis Date  . Abnormal PFT 1. 05/18/08  2. 11/30/08   1. Showed mild airflow obstruction, mild restriction, mild diffusion defect; FEV1 2.22(64%), FVC 3.33(65%), FEVi% 67, TLC 5.19(69%), DLCO 77%, +BD  2. FEV1 2.38(73%), FVC 3.81(80%), FEV1% 63, TLC 5.61(80%), DLCO 79%, no BD  . Allergic rhinitis   . Anxiety   . BPH (benign prostatic hyperplasia)   . Cellulitis    right leg MRSA  . Erectile dysfunction   . GERD (gastroesophageal reflux disease)   . Heart murmur    mild MR by echo  . Hyperlipidemia   . Hypertension   . Osteoarthritis   . Paresthesias 06/22/2012  . Parkinsonism ([place]) 06/22/2012  . Permanent atrial fibrillation (  [place])   . Restrictive lung disease     His Past Surgical History Is Significant For: Past Surgical History:  Procedure Laterality Date  . CARDIAC CATHETERIZATION  11/2009   normal coronary arteries  . CARDIOVERSION     multiple  . CATARACT EXTRACTION    . COLONOSCOPY    . COLONOSCOPY WITH PROPOFOL N/A 09/02/2016   Procedure: COLONOSCOPY WITH PROPOFOL;  Surgeon: Garlan Fair, MD;  Location: [facility] END[facility]COPY;  Service: Endoscopy;  Laterality: N/A;  . EYE SURGERY     bilateral cataract extraction  . JOINT REPLACEMENT    . TOTAL KNEE ARTHROPLASTY Left 11/30/2014   Procedure: TOTAL KNEE ARTHROPLASTY;  Surgeon: Susa Day, MD;  Location: [facility] OR[facility];  Service: Orthopedics;  Laterality: Left;  . TOTAL KNEE ARTHROPLASTY Right 07/12/2015   Procedure: RIGHT TOTAL KNEE ARTHROPLASTY;  Surgeon: Susa Day, MD;  Location: [facility] OR[facility];  Service: Orthopedics;  Laterality: Right;  . TRICEPS TENDON REPAIR Right 05/20/2017  . TRICEPS TENDON REPAIR Right 05/20/2017   Procedure: TRICEPS TENDON REPAIR;  Surgeon: Iran Planas, MD;  Location: [place];  Service: Orthopedics;  Laterality: Right;    His Family History Is Significant For: Family History  Problem Relation Age of Onset  . Diabetes Mother        DM  . Stroke Father        CVA  . Pancreatic cancer  Brother   . Pancreatic cancer Other        Nephew    His Social History Is Significant For: Social History   Socioeconomic History  . Marital status: Married    Spouse name: Brian [name]  . Number of children: 4  . Years of education: masters  . Highest education level: Not on file  Occupational History  . Occupation: Chief of Staff    Comment: Worked with [employer]  Tobacco Use  . Smoking status: Never Smoker  . Smokeless tobacco: Never Used  Substance and Sexual Activity  . Alcohol use: No    Alcohol/week: 0.0 standard drinks  . Drug use: No  . Sexual activity: Not on file  Other Topics Concern  . Not on file  Social History Narrative   Married and lives in [place].  Retired Optometrist.   No reported caffeine use.    Social Determinants of Health   Financial Resource Strain:   . Difficulty of Paying Living Expenses:   Food Insecurity:   . Worried About Charity fundraiser in the Last Year:   . Arboriculturist in the Last Year:   Transportation Needs:   . Film/video editor (Medical):   Marland Kitchen Lack of Transportation (Non-Medical):   Physical Activity:   . Days of Exercise per Week:   . Minutes of Exercise per Session:   Stress:   . Feeling of Stress :   Social Connections:   . Frequency of Communication with Friends and Family:   . Frequency of Social Gatherings with Friends and Family:   . Attends Religious Services:   . Active Member of Clubs or Organizations:   . Attends Archivist Meetings:   Marland Kitchen Marital Status:     His Allergies Are:  Allergies  Allergen Reactions  . Lisinopril Cough  :   His Current Medications Are:  Outpatient Encounter Medications as of 08/17/2019  Medication Sig  . ammonium lactate (LAC-HYDRIN) 12 % cream Apply topically as needed for dry skin. As prescribed  . apixaban (ELIQUIS) 5 MG TABS tablet Take 1 tablet (  5 mg total) by mouth 2 (two) times daily.  . carbidopa-levodopa (SINEMET CR) 50-200 MG tablet Take 2  tablets by mouth at bedtime.  . carbidopa-levodopa (SINEMET IR) 25-100 MG tablet 2 pills 6 times a day, starting at 7 AM, every 2 1/2 hours  . cholecalciferol (VITAMIN D) 1000 units tablet Take 1,000 Units by mouth daily. 7 am  . clonazePAM (KLONOPIN) 0.5 MG disintegrating tablet Take 1 tablet (0.5 mg total) by mouth at bedtime.  Marland Kitchen doxazosin (CARDURA) 2 MG tablet Take 2 mg by mouth at bedtime.   Marland Kitchen escitalopram (LEXAPRO) 10 MG tablet Take 1 tablet (10 mg total) by mouth at bedtime.  . famotidine (PEPCID) 40 MG tablet Take 40 mg by mouth every evening. 7 pm  . finasteride (PROSCAR) 5 MG tablet Take 5 mg by mouth daily.  Marland Kitchen FLUAD 0.5 ML SUSY ADM 0.5ML IM UTD  . furosemide (LASIX) 40 MG tablet Take 40 mg by mouth 2 (two) times daily.  Marland Kitchen HYDROcodone-acetaminophen (NORCO/VICODIN) 5-325 MG tablet Take 1 tablet by mouth every 4 (four) hours as needed.  Marland Kitchen losartan (COZAAR) 50 MG tablet Take 50 mg by mouth every morning.  . metoprolol succinate (TOPROL-XL) 25 MG 24 hr tablet Take 25 mg by mouth every evening. 7 pm  . Multiple Vitamin (MULTIVITAMIN WITH MINERALS) TABS tablet Take 1 tablet by mouth daily. 7 am  . Pimavanserin Tartrate (NUPLAZID) 34 MG CAPS Take 34 mg by mouth daily. Take 1 capsule (34 mg total) by mouth daily.  . potassium chloride (KLOR-CON 10) 10 MEQ tablet Klor-Con 10 mEq tablet,extended release  . potassium chloride (KLOR-CON) 10 MEQ tablet Take 20 mEq by mouth daily.  . pravastatin (PRAVACHOL) 40 MG tablet Take 40 mg by mouth every morning.   . rivastigmine (EXELON) 4.5 MG capsule Take 1 capsule (4.5 mg total) by mouth 2 (two) times daily.  . sodium chloride (OCEAN) 0.65 % SOLN nasal spray Place 1 spray into both nostrils daily as needed for congestion.  . tamsulosin (FLOMAX) 0.4 MG CAPS capsule Take 0.4 mg at bedtime by mouth.  Marland Kitchen UNABLE TO FIND Take by mouth.  . vitamin C (ASCORBIC ACID) 500 MG tablet Take 500 mg by mouth every morning.  . [DISCONTINUED] gabapentin (NEURONTIN) 100 MG  capsule Take 1 capsule (100 mg total) by mouth 3 (three) times daily.  . [DISCONTINUED] rivastigmine (EXELON) 1.5 MG capsule Take 1.5 mg by mouth 2 (two) times daily.   No facility-administered encounter medications on file as of 08/17/2019.  :  Review of Systems:  Out of a complete 14 point review of systems, all are reviewed and negative with the exception of these symptoms as listed below:  Virtual Visit via Video Note on 08/17/2019:  I connected with patient's wife and patient on 08/17/19 at 11:30 AM EDT by a video enabled telemedicine application and verified that I am speaking with the correct person using two identifiers.   I discussed the limitations of evaluation and management by telemedicine and the availability of in person appointments. The patient expressed understanding and agreed to proceed.  History of Present Illness: See above.    Observations/Objective: He is somewhat lethargic appearing today, he is very quiet today, nearly nonverbal. He has difficulty understanding and answers in very short sentences with significant delay and his wife has to repeatedly ask the same question for him to answer. He has moderate facial masking, significant hypophonia and mild to moderate dysarthria. His overall upper body movements are very slow. He  is able to follow simple commands. Airway examination shows nonfocal findings.  Assessment and Plan:  In summary, Brian [name] a very pleasant 79 year old male with a history of atrial fibrillation, hypertension, hyperlipidemia, who presents for followup consultation of his right-sided predominant,Parkinson's disease, akinetic-rigid type, complicated byRecurrent falls, memory loss, anxiety, depression, sleep disturbance, arthritis with status post bilateral knee replacement surgeries constipation and hallucinations. Nuplazid was not affordable unfortunately.  He has had significant decline in his mobility and has had recurrent falls. He was  able to tolerate Exelon 4.5 mg twice daily, we will switch back to the 4.5 mg strength. He is more and more wheelchair-bound at this time and has fallen in the process of transferring into or out of his wheelchair. We will initiate home health referrals to PT, OT and ST. He is unable to safely leave the home without assistance and sometimes even with assistance it is not possible. His wife has noticed more lethargy. He does appear to be lethargic today.  I suggested we place him out of gabapentin, he takes 100 mg 3 times daily and she is advised to bring it down to 1 pill twice daily for the next week, then 1 pill nightly for another week and then stop altogether. We will keep the Sinemet IR, Sinemet CR, clonazepam and Lexapro the same. I will place a referral to [facility] home health. I suggested a follow-up for a face-to-face visit if possible in our office in 3 months, sooner if needed. I do believe that checking his hearing might help. He may benefit from having hearing aids if he qualifies. I answered all their questions today and particularly spend a long time the day talking to his wife.    Follow Up Instructions:    I discussed the assessment and treatment plan with the patient. The patient was provided an opportunity to ask questions and all were answered. The patient agreed with the plan and demonstrated an understanding of the instructions.   The patient was advised to call back or seek an in-person evaluation if the symptoms worsen or if the condition fails to improve as anticipated.  I provided 40 minutes of non-face-to-face time during this encounter.   Star Age, MD

## 2019-08-19 DIAGNOSIS — R112 Nausea with vomiting, unspecified: Secondary | ICD-10-CM | POA: Diagnosis not present

## 2019-08-19 DIAGNOSIS — W19XXXA Unspecified fall, initial encounter: Secondary | ICD-10-CM | POA: Diagnosis not present

## 2019-08-19 DIAGNOSIS — R110 Nausea: Secondary | ICD-10-CM | POA: Diagnosis not present

## 2019-08-30 ENCOUNTER — Emergency Department (HOSPITAL_COMMUNITY)
Admission: EM | Admit: 2019-08-30 | Discharge: 2019-08-30 | Disposition: A | Discharge status: Home / Self Care | Payer: Medicare Other | Attending: Emergency Medicine | Admitting: Emergency Medicine

## 2019-08-30 ENCOUNTER — Other Ambulatory Visit: Payer: Self-pay

## 2019-08-30 ENCOUNTER — Encounter (HOSPITAL_COMMUNITY): Payer: Self-pay

## 2019-08-30 ENCOUNTER — Telehealth: Payer: Self-pay | Admitting: Neurology

## 2019-08-30 ENCOUNTER — Emergency Department (HOSPITAL_COMMUNITY): Payer: Medicare Other

## 2019-08-30 ENCOUNTER — Telehealth: Payer: Self-pay | Admitting: Cardiology

## 2019-08-30 ENCOUNTER — Telehealth: Payer: Self-pay

## 2019-08-30 DIAGNOSIS — N182 Chronic kidney disease, stage 2 (mild): Secondary | ICD-10-CM | POA: Insufficient documentation

## 2019-08-30 DIAGNOSIS — E785 Hyperlipidemia, unspecified: Secondary | ICD-10-CM | POA: Diagnosis not present

## 2019-08-30 DIAGNOSIS — G319 Degenerative disease of nervous system, unspecified: Secondary | ICD-10-CM | POA: Diagnosis not present

## 2019-08-30 DIAGNOSIS — I959 Hypotension, unspecified: Secondary | ICD-10-CM | POA: Diagnosis not present

## 2019-08-30 DIAGNOSIS — R351 Nocturia: Secondary | ICD-10-CM | POA: Diagnosis not present

## 2019-08-30 DIAGNOSIS — Z79899 Other long term (current) drug therapy: Secondary | ICD-10-CM | POA: Insufficient documentation

## 2019-08-30 DIAGNOSIS — N401 Enlarged prostate with lower urinary tract symptoms: Secondary | ICD-10-CM | POA: Diagnosis not present

## 2019-08-30 DIAGNOSIS — G20 Parkinson's disease: Secondary | ICD-10-CM | POA: Diagnosis not present

## 2019-08-30 DIAGNOSIS — G3183 Dementia with Lewy bodies: Secondary | ICD-10-CM | POA: Diagnosis not present

## 2019-08-30 DIAGNOSIS — R031 Nonspecific low blood-pressure reading: Secondary | ICD-10-CM | POA: Diagnosis present

## 2019-08-30 DIAGNOSIS — F419 Anxiety disorder, unspecified: Secondary | ICD-10-CM | POA: Diagnosis not present

## 2019-08-30 DIAGNOSIS — I051 Rheumatic mitral insufficiency: Secondary | ICD-10-CM | POA: Diagnosis not present

## 2019-08-30 DIAGNOSIS — Z96653 Presence of artificial knee joint, bilateral: Secondary | ICD-10-CM | POA: Insufficient documentation

## 2019-08-30 DIAGNOSIS — R911 Solitary pulmonary nodule: Secondary | ICD-10-CM | POA: Diagnosis not present

## 2019-08-30 DIAGNOSIS — R338 Other retention of urine: Secondary | ICD-10-CM | POA: Diagnosis not present

## 2019-08-30 DIAGNOSIS — Z7901 Long term (current) use of anticoagulants: Secondary | ICD-10-CM | POA: Insufficient documentation

## 2019-08-30 DIAGNOSIS — I493 Ventricular premature depolarization: Secondary | ICD-10-CM | POA: Diagnosis not present

## 2019-08-30 DIAGNOSIS — I129 Hypertensive chronic kidney disease with stage 1 through stage 4 chronic kidney disease, or unspecified chronic kidney disease: Secondary | ICD-10-CM | POA: Insufficient documentation

## 2019-08-30 DIAGNOSIS — K219 Gastro-esophageal reflux disease without esophagitis: Secondary | ICD-10-CM | POA: Diagnosis not present

## 2019-08-30 DIAGNOSIS — F329 Major depressive disorder, single episode, unspecified: Secondary | ICD-10-CM | POA: Diagnosis not present

## 2019-08-30 DIAGNOSIS — I4891 Unspecified atrial fibrillation: Secondary | ICD-10-CM | POA: Diagnosis not present

## 2019-08-30 DIAGNOSIS — N529 Male erectile dysfunction, unspecified: Secondary | ICD-10-CM | POA: Diagnosis not present

## 2019-08-30 DIAGNOSIS — D631 Anemia in chronic kidney disease: Secondary | ICD-10-CM | POA: Diagnosis not present

## 2019-08-30 DIAGNOSIS — I4821 Permanent atrial fibrillation: Secondary | ICD-10-CM | POA: Diagnosis not present

## 2019-08-30 DIAGNOSIS — G4733 Obstructive sleep apnea (adult) (pediatric): Secondary | ICD-10-CM | POA: Diagnosis not present

## 2019-08-30 DIAGNOSIS — G4752 REM sleep behavior disorder: Secondary | ICD-10-CM | POA: Diagnosis not present

## 2019-08-30 DIAGNOSIS — R531 Weakness: Secondary | ICD-10-CM | POA: Diagnosis not present

## 2019-08-30 DIAGNOSIS — R519 Headache, unspecified: Secondary | ICD-10-CM | POA: Diagnosis not present

## 2019-08-30 DIAGNOSIS — J45909 Unspecified asthma, uncomplicated: Secondary | ICD-10-CM | POA: Diagnosis not present

## 2019-08-30 DIAGNOSIS — R471 Dysarthria and anarthria: Secondary | ICD-10-CM | POA: Diagnosis not present

## 2019-08-30 DIAGNOSIS — F0280 Dementia in other diseases classified elsewhere without behavioral disturbance: Secondary | ICD-10-CM | POA: Diagnosis not present

## 2019-08-30 LAB — COMPREHENSIVE METABOLIC PANEL
ALT: 11 U/L (ref 0–44)
AST: 26 U/L (ref 15–41)
Albumin: 3.7 g/dL (ref 3.5–5.0)
Alkaline Phosphatase: 43 U/L (ref 38–126)
Anion gap: 9 (ref 5–15)
BUN: 22 mg/dL (ref 8–23)
CO2: 27 mmol/L (ref 22–32)
Calcium: 8.7 mg/dL — ABNORMAL LOW (ref 8.9–10.3)
Chloride: 103 mmol/L (ref 98–111)
Creatinine, Ser: 1.13 mg/dL (ref 0.61–1.24)
GFR calc Af Amer: >60 mL/min (ref >60)
GFR calc non Af Amer: >60 mL/min (ref >60)
Glucose, Bld: 103 mg/dL — ABNORMAL HIGH (ref 70–99)
Potassium: 4.3 mmol/L (ref 3.5–5.1)
Sodium: 139 mmol/L (ref 135–145)
Total Bilirubin: 1.1 mg/dL (ref 0.3–1.2)
Total Protein: 6.6 g/dL (ref 6.5–8.1)

## 2019-08-30 LAB — URINALYSIS, ROUTINE W REFLEX MICROSCOPIC
Bilirubin Urine: NEGATIVE
Glucose, UA: NEGATIVE mg/dL
Hgb urine dipstick: NEGATIVE
Ketones, ur: NEGATIVE mg/dL
Leukocytes,Ua: NEGATIVE
Nitrite: NEGATIVE
Protein, ur: NEGATIVE mg/dL
Specific Gravity, Urine: 1.004 — ABNORMAL LOW (ref 1.005–1.030)
pH: 5 (ref 5.0–8.0)

## 2019-08-30 LAB — CBC WITH DIFFERENTIAL/PLATELET
Abs Immature Granulocytes: 0.01 10*3/uL (ref 0.00–0.07)
Basophils Absolute: 0 10*3/uL (ref 0.0–0.1)
Basophils Relative: 1 %
Eosinophils Absolute: 0.1 10*3/uL (ref 0.0–0.5)
Eosinophils Relative: 1 %
HCT: 34.7 % — ABNORMAL LOW (ref 39.0–52.0)
Hemoglobin: 11.2 g/dL — ABNORMAL LOW (ref 13.0–17.0)
Immature Granulocytes: 0 %
Lymphocytes Relative: 23 %
Lymphs Abs: 0.9 10*3/uL (ref 0.7–4.0)
MCH: 32.1 pg (ref 26.0–34.0)
MCHC: 32.3 g/dL (ref 30.0–36.0)
MCV: 99.4 fL (ref 80.0–100.0)
Monocytes Absolute: 0.4 10*3/uL (ref 0.1–1.0)
Monocytes Relative: 10 %
Neutro Abs: 2.6 10*3/uL (ref 1.7–7.7)
Neutrophils Relative %: 65 %
Platelets: 166 10*3/uL (ref 150–400)
RBC: 3.49 MIL/uL — ABNORMAL LOW (ref 4.22–5.81)
RDW: 13 % (ref 11.5–15.5)
WBC: 4 10*3/uL (ref 4.0–10.5)
nRBC: 0 % (ref 0.0–0.2)

## 2019-08-30 MED ORDER — SODIUM CHLORIDE 0.9 % IV BOLUS
500.0000 mL | Freq: Once | INTRAVENOUS | Status: AC
  Administered 2019-08-30: 500 mL via INTRAVENOUS

## 2019-08-30 NOTE — Telephone Encounter (Signed)
Don OT called to report during therapy session.  Pt's bp was checked manually and automatically.  First BP was 66/44 and second was 66/42. He reports the pt was coherent but lethargic. I advised the pt should be evaluated in the ED and Timmothy Sours would call EMS. Pt was evaluated by Dr. Radford Pax 6 months ago I also advised I could provide # to her office. Timmothy Sours was agreeable to calling EMS.

## 2019-08-30 NOTE — Telephone Encounter (Signed)
New Message:    Brian [name] from [facility] called. He wanted Dr Radford Pax to know that pt was taken to [facility] ER for his low blood pressure. It was 66/42 when he was at the pt's home. When EMS got there, they could not hear it, The Palpated reading they got was 84. Pt was very fatigued.

## 2019-08-30 NOTE — Telephone Encounter (Signed)
PT Shamrock has called for a continuation of orders for Physical Therapy 2 week 3, 1 week 6 and a referral for Home Health Aid bathing and dressing. 2 week 3 and Medical Education officer, [employer] for [employer]

## 2019-08-30 NOTE — ED Provider Notes (Signed)
[facility] DEPT Provider Note   CSN: [id number] Arrival date & time: 08/30/19  1620     History Chief Complaint  Patient presents with  . Hypotension    Brian [name] is a 79 y.o. male.  The history is provided by the patient, medical records and the spouse. No language interpreter was used.   Brian [name] is a 79 y.o. male who presents to the Emergency Department complaining of hypotension.   He presents to the ED by EMS from home for evaluation of hypotension earlier today.  He states that he has been feeling well recently with no illnesses or complaints.  Therapy came to his home today and checked his blood pressure and it was found to be low and EMS was called.  He denies any recent medication changes or missed medications.  Denies chest pain, sob, abdominal pain, nausea, vomiting, diarrhea, black/bloody stools.  Lives with wife.  States that he hasn't been eating well since a recent move.  Additional hx obtained from patient's wife after his initial ED arrival.  He has been more sleepy for the last few weeks (falls asleep during meals).  He has been wheelchair bound for the last month.      Past Medical History:  Diagnosis Date  . Abnormal PFT 1. 05/18/08  2. 11/30/08   1. Showed mild airflow obstruction, mild restriction, mild diffusion defect; FEV1 2.22(64%), FVC 3.33(65%), FEVi% 67, TLC 5.19(69%), DLCO 77%, +BD  2. FEV1 2.38(73%), FVC 3.81(80%), FEV1% 63, TLC 5.61(80%), DLCO 79%, no BD  . Allergic rhinitis   . Anxiety   . BPH (benign prostatic hyperplasia)   . Cellulitis    right leg MRSA  . Erectile dysfunction   . GERD (gastroesophageal reflux disease)   . Heart murmur    mild MR by echo  . Hyperlipidemia   . Hypertension   . Osteoarthritis   . Paresthesias 06/22/2012  . Parkinsonism ([place]) 06/22/2012  . Permanent atrial fibrillation ([place])   . Restrictive lung disease     Patient Active Problem List   Diagnosis Date Noted  . Rupture of  right triceps tendon 05/20/2017  . Triceps tendon rupture, right, initial encounter 05/20/2017  . Atrial fibrillation ([place]) [I48.91] 02/05/2017  . Urinary retention 12/31/2015  . Acute renal failure (ARF) ([place]) 12/31/2015  . Acute encephalopathy 12/31/2015  . Primary osteoarthritis of right knee 07/12/2015  . Primary osteoarthritis of left knee 11/30/2014  . Encounter for therapeutic drug monitoring 06/21/2013  . Edema of extremities 02/04/2013  . Chronic anticoagulation 02/04/2013  . GERD (gastroesophageal reflux disease)   . Restrictive lung disease   . CKD (chronic kidney disease) stage 2, GFR 60-89 ml/min   . Cellulitis   . Allergic rhinitis   . Anxiety   . Long term (current) use of anticoagulants 01/25/2013  . Parkinsonism ([place]) 06/22/2012  . Paresthesias 06/22/2012  . PULMONARY NODULE 06/02/2008  . SNORING 06/02/2008  . Hyperlipidemia 05/15/2008  . Essential hypertension 05/15/2008  . PULMONARY FUNCTION TESTS, ABNORMAL 05/15/2008    Past Surgical History:  Procedure Laterality Date  . CARDIAC CATHETERIZATION  11/2009   normal coronary arteries  . CARDIOVERSION     multiple  . CATARACT EXTRACTION    . COLONOSCOPY    . COLONOSCOPY WITH PROPOFOL N/A 09/02/2016   Procedure: COLONOSCOPY WITH PROPOFOL;  Surgeon: Garlan Fair, MD;  Location: [facility];  Service: Endoscopy;  Laterality: N/A;  . EYE SURGERY     bilateral cataract extraction  .  JOINT REPLACEMENT    . TOTAL KNEE ARTHROPLASTY Left 11/30/2014   Procedure: TOTAL KNEE ARTHROPLASTY;  Surgeon: Susa Day, MD;  Location: [facility] ORS;  Service: Orthopedics;  Laterality: Left;  . TOTAL KNEE ARTHROPLASTY Right 07/12/2015   Procedure: RIGHT TOTAL KNEE ARTHROPLASTY;  Surgeon: Susa Day, MD;  Location: [facility] ORS;  Service: Orthopedics;  Laterality: Right;  . TRICEPS TENDON REPAIR Right 05/20/2017  . TRICEPS TENDON REPAIR Right 05/20/2017   Procedure: TRICEPS TENDON REPAIR;  Surgeon: Iran Planas, MD;  Location: [facility];   Service: Orthopedics;  Laterality: Right;       Family History  Problem Relation Age of Onset  . Diabetes Mother        DM  . Stroke Father        CVA  . Pancreatic cancer Brother   . Pancreatic cancer Other        Nephew    Social History   Tobacco Use  . Smoking status: Never Smoker  . Smokeless tobacco: Never Used  Substance Use Topics  . Alcohol use: No    Alcohol/week: 0.0 standard drinks  . Drug use: No    Home Medications Prior to Admission medications   Medication Sig Start Date End Date Taking? Authorizing Provider  apixaban (ELIQUIS) 5 MG TABS tablet Take 1 tablet (5 mg total) by mouth 2 (two) times daily. 12/31/18  Yes Turner, Eber Hong, MD  carbidopa-levodopa (SINEMET CR) 50-200 MG tablet Take 2 tablets by mouth at bedtime. 06/15/19  Yes Star Age, MD  carbidopa-levodopa (SINEMET IR) 25-100 MG tablet 2 pills 6 times a day, starting at 7 AM, every 2 1/2 hours Patient taking differently: Take 2 tablets by mouth See admin instructions. Takes 2 tablets by mouth 6 times a day, starting at 7 AM, every 2 1/2 hours. 06/15/19  Yes Star Age, MD  cholecalciferol (VITAMIN D) 1000 units tablet Take 1,000 Units by mouth daily.    Yes [provider]  clonazePAM (KLONOPIN) 0.5 MG disintegrating tablet Take 1 tablet (0.5 mg total) by mouth at bedtime. 06/15/19  Yes Star Age, MD  doxazosin (CARDURA) 2 MG tablet Take 2 mg by mouth at bedtime.    Yes [provider]  escitalopram (LEXAPRO) 10 MG tablet Take 1 tablet (10 mg total) by mouth at bedtime. 06/15/19  Yes Star Age, MD  famotidine (PEPCID) 40 MG tablet Take 40 mg by mouth daily.    Yes [provider]  furosemide (LASIX) 40 MG tablet Take 40 mg by mouth 2 (two) times daily. 02/20/18  Yes [provider]  losartan (COZAAR) 50 MG tablet Take 50 mg by mouth every morning.   Yes [provider]  metoprolol succinate (TOPROL-XL) 25 MG 24 hr tablet Take 25 mg by mouth daily.   04/16/16  Yes [provider]  Multiple Vitamin (MULTIVITAMIN WITH MINERALS) TABS tablet Take 1 tablet by mouth daily.    Yes [provider]  potassium chloride (KLOR-CON 10) 10 MEQ tablet Take 20 mEq by mouth daily.    Yes [provider]  pravastatin (PRAVACHOL) 40 MG tablet Take 40 mg by mouth daily.    Yes [provider]  rivastigmine (EXELON) 4.5 MG capsule Take 1 capsule (4.5 mg total) by mouth 2 (two) times daily. 08/17/19  Yes Star Age, MD  tamsulosin (FLOMAX) 0.4 MG CAPS capsule Take 0.4 mg at bedtime by mouth.   Yes [provider]  vitamin C (ASCORBIC ACID) 500 MG tablet Take 500 mg  by mouth daily.    Yes [provider]  ammonium lactate (LAC-HYDRIN) 12 % cream Apply topically as needed for dry skin. As prescribed Patient not taking: Reported on 08/30/2019 11/05/16   Gardiner Barefoot, DPM    Allergies    Finasteride and Lisinopril  Review of Systems   Review of Systems  All other systems reviewed and are negative.   Physical Exam Updated Vital Signs BP (!) 174/98   Pulse 72   Temp 98.3 F (36.8 C) (Oral)   Resp (!) 21   SpO2 100%   Physical Exam Vitals and nursing note reviewed.  Constitutional:      Appearance: He is well-developed.  HENT:     Head: Normocephalic and atraumatic.  Cardiovascular:     Rate and Rhythm: Normal rate. Rhythm irregular.     Heart sounds: No murmur.  Pulmonary:     Effort: Pulmonary effort is normal. No respiratory distress.     Breath sounds: Normal breath sounds.  Abdominal:     Palpations: Abdomen is soft.     Tenderness: There is no abdominal tenderness. There is no guarding or rebound.  Musculoskeletal:        General: No swelling or tenderness.  Skin:    General: Skin is warm and dry.  Neurological:     Mental Status: He is alert.     Comments: Oriented to person and place.  Disoriented to time.  5/5 strength in all four extremities with sensation to light touch intact in  all four extremities.    Psychiatric:        Behavior: Behavior normal.     ED Results / Procedures / Treatments   Labs (all labs ordered are listed, but only abnormal results are displayed) Labs Reviewed  COMPREHENSIVE METABOLIC PANEL - Abnormal; Notable for the following components:      Result Value   Glucose, Bld 103 (*)    Calcium 8.7 (*)    All other components within normal limits  CBC WITH DIFFERENTIAL/PLATELET - Abnormal; Notable for the following components:   RBC 3.49 (*)    Hemoglobin 11.2 (*)    HCT 34.7 (*)    All other components within normal limits  URINALYSIS, ROUTINE W REFLEX MICROSCOPIC - Abnormal; Notable for the following components:   Color, Urine STRAW (*)    Specific Gravity, Urine 1.004 (*)    All other components within normal limits  URINE CULTURE    EKG EKG Interpretation  Date/Time:  Tuesday August 30 2019 16:36:17 EDT Ventricular Rate:  76 PR Interval:    QRS Duration: 98 QT Interval:  439 QTC Calculation: 494 R Axis:   -26 Text Interpretation: Atrial fibrillation Ventricular premature complex Borderline left axis deviation Nonspecific T abnormalities, diffuse leads Borderline prolonged QT interval Confirmed by Quintella Reichert [id number]) on 08/30/2019 5:57:24 PM   Radiology CT Head Wo Contrast  Result Date: 08/30/2019 CLINICAL DATA:  Hypotension. EXAM: CT HEAD WITHOUT CONTRAST TECHNIQUE: Contiguous axial images were obtained from the base of the skull through the vertex without intravenous contrast. COMPARISON:  January 30, 2019 FINDINGS: Brain: There is moderate cerebral atrophy with widening of the extra-axial spaces and ventricular dilatation. There are areas of decreased attenuation within the white matter tracts of the supratentorial brain, consistent with microvascular disease changes. Vascular: No hyperdense vessel or unexpected calcification. Skull: Normal. Negative for fracture or focal lesion. Sinuses/Orbits: No acute finding. Other: A small  amount of soft tissue air is seen adjacent to the superior  and anterolateral aspects of the right sphenoid bone. IMPRESSION: 1. Generalized cerebral atrophy. 2. Small amount of soft tissue air adjacent to the superior and anterolateral aspects of the right sphenoid bone which may represent sequelae associated with an underlying fracture. MRI correlation is recommended if this is of clinical concern. Electronically Signed   By: Virgina Norfolk M.D.   On: 08/30/2019 19:42    Procedures Procedures (including critical care time)  Medications Ordered in ED Medications  sodium chloride 0.9 % bolus 500 mL (0 mLs Intravenous Stopped 08/30/19 1751)    ED Course  I have reviewed the triage vital signs and the nursing notes.  Pertinent labs & imaging results that were available during my care of the patient were reviewed by me and considered in my medical decision making (see chart for details).    MDM Rules/Calculators/A&P                     Pt here for evaluation of hypotensive episode at home.  No hypotension during ED stay and patient is asymptomatic.  He cannot stand for orthostatic vital signs but pt has been wheelchair bound for one month.  CBC with mild anemia compared to baseline - no reports of GI bleeding.  UA not c/w UTI.  CT head obtained given reports of fall at home with head injury 1-2 weeks ago and increased sleepiness - no evidence of SDH.  CT with possible gas near sphenoid - pt without local tenderness.  No evidence of local fracture or infection.  D/w pt and wife home care for hypotensive episode of unclear etiology.  Return precautions discussed.   Doubt PE.    Final Clinical Impression(s) / ED Diagnoses Final diagnoses:  Hypotensive episode    Rx / DC Orders ED Discharge Orders    None       Quintella Reichert, MD 08/30/19 2008

## 2019-08-30 NOTE — ED Triage Notes (Signed)
Pt arrives [facility] from home. Per EMS: Pt was found hypotensive by OT. EMS initial BP was 88 systolic. Pts BP is now 116/60. EMS administered 150cc of NS. Pt has hx of parkinsons.Pt denies any complaints at this time. Per pts wife: Pt seems more drowsy than normal.

## 2019-08-31 ENCOUNTER — Telehealth: Payer: Self-pay | Admitting: Cardiology

## 2019-08-31 NOTE — Telephone Encounter (Signed)
Herbert Deaner physical therapist from [facility] calling to update that the patient was recently started in home health after a referral from neurologist. He states she was sent to the ED yesterday due to a hypotensive event. He states there are no medication changes.

## 2019-08-31 NOTE — Telephone Encounter (Signed)
Brian [name] from Brian [name] and would like verbal orders for nursing medication mgt. Patient was just released from hospital  . Brian [name] also relayed for Dr. Rexene [name] to look at CT of the head that was done .

## 2019-08-31 NOTE — Telephone Encounter (Signed)
I contacted physical therapist [name] back and provided verbal order as requested via vm. [name] was advised to call back if he had any questions/concerns.

## 2019-08-31 NOTE — Telephone Encounter (Signed)
lvm for [name] with the verbal order for nursing med management. I also advised Dr. Rexene Alberts would review CT once she returns.

## 2019-08-31 NOTE — Telephone Encounter (Signed)
Dr. Rexene Alberts is out of the office until 09/12/2019. Will have her review images from ED once she returns.

## 2019-09-01 ENCOUNTER — Encounter: Payer: Self-pay | Admitting: Neurology

## 2019-09-01 DIAGNOSIS — F0280 Dementia in other diseases classified elsewhere without behavioral disturbance: Secondary | ICD-10-CM | POA: Diagnosis not present

## 2019-09-01 DIAGNOSIS — G3183 Dementia with Lewy bodies: Secondary | ICD-10-CM | POA: Diagnosis not present

## 2019-09-01 DIAGNOSIS — I129 Hypertensive chronic kidney disease with stage 1 through stage 4 chronic kidney disease, or unspecified chronic kidney disease: Secondary | ICD-10-CM | POA: Diagnosis not present

## 2019-09-01 DIAGNOSIS — I959 Hypotension, unspecified: Secondary | ICD-10-CM | POA: Diagnosis not present

## 2019-09-01 DIAGNOSIS — I4821 Permanent atrial fibrillation: Secondary | ICD-10-CM | POA: Diagnosis not present

## 2019-09-01 DIAGNOSIS — N182 Chronic kidney disease, stage 2 (mild): Secondary | ICD-10-CM | POA: Diagnosis not present

## 2019-09-01 LAB — URINE CULTURE

## 2019-09-02 DIAGNOSIS — I4821 Permanent atrial fibrillation: Secondary | ICD-10-CM | POA: Diagnosis not present

## 2019-09-02 DIAGNOSIS — N182 Chronic kidney disease, stage 2 (mild): Secondary | ICD-10-CM | POA: Diagnosis not present

## 2019-09-02 DIAGNOSIS — I129 Hypertensive chronic kidney disease with stage 1 through stage 4 chronic kidney disease, or unspecified chronic kidney disease: Secondary | ICD-10-CM | POA: Diagnosis not present

## 2019-09-02 DIAGNOSIS — F0280 Dementia in other diseases classified elsewhere without behavioral disturbance: Secondary | ICD-10-CM | POA: Diagnosis not present

## 2019-09-02 DIAGNOSIS — I959 Hypotension, unspecified: Secondary | ICD-10-CM | POA: Diagnosis not present

## 2019-09-02 DIAGNOSIS — G3183 Dementia with Lewy bodies: Secondary | ICD-10-CM | POA: Diagnosis not present

## 2019-09-02 NOTE — Telephone Encounter (Signed)
Attempted to call the patient to discuss symptoms further. Left message for patient to call back

## 2019-09-02 NOTE — Telephone Encounter (Signed)
From:[name]      Sent:09/01/2019  4:26 PM EDT        BM:[id number] Rexene Alberts, MD   Subject:Non-Urgent Medical Question  Alvis Lemmings Therapist wants Brian [name] to get a Modified Barium Swallow Study Thru the Radiology Dept at [facility] as soon as possible.   We need a deferral for the test.   Thanks  Karie Fetch

## 2019-09-02 NOTE — Telephone Encounter (Signed)
Spoke with [name] who states that the patient was sent to the ED due to low blood pressure. He states that BP was 80s/60s. No changes were made to any medications after ED visit. The patient has still had some low BP readings in the AM since being discharged from ED. [name] does not know the readings but states that the patient's wife reports that the patient is lethargic and BP is low in the mornings still.

## 2019-09-02 NOTE — Telephone Encounter (Signed)
If patient continues to feel poorly and with low BP needs to go back to hospital over the weekend otherwise please get in with an extender on MOnday or with PCP

## 2019-09-05 DIAGNOSIS — I4821 Permanent atrial fibrillation: Secondary | ICD-10-CM | POA: Diagnosis not present

## 2019-09-05 DIAGNOSIS — F0280 Dementia in other diseases classified elsewhere without behavioral disturbance: Secondary | ICD-10-CM | POA: Diagnosis not present

## 2019-09-05 DIAGNOSIS — G3183 Dementia with Lewy bodies: Secondary | ICD-10-CM | POA: Diagnosis not present

## 2019-09-05 DIAGNOSIS — I959 Hypotension, unspecified: Secondary | ICD-10-CM | POA: Diagnosis not present

## 2019-09-05 DIAGNOSIS — I129 Hypertensive chronic kidney disease with stage 1 through stage 4 chronic kidney disease, or unspecified chronic kidney disease: Secondary | ICD-10-CM | POA: Diagnosis not present

## 2019-09-05 DIAGNOSIS — N182 Chronic kidney disease, stage 2 (mild): Secondary | ICD-10-CM | POA: Diagnosis not present

## 2019-09-05 NOTE — Telephone Encounter (Signed)
Left message for patient to call back  

## 2019-09-07 DIAGNOSIS — I129 Hypertensive chronic kidney disease with stage 1 through stage 4 chronic kidney disease, or unspecified chronic kidney disease: Secondary | ICD-10-CM | POA: Diagnosis not present

## 2019-09-07 DIAGNOSIS — I959 Hypotension, unspecified: Secondary | ICD-10-CM | POA: Diagnosis not present

## 2019-09-07 DIAGNOSIS — N182 Chronic kidney disease, stage 2 (mild): Secondary | ICD-10-CM | POA: Diagnosis not present

## 2019-09-07 DIAGNOSIS — I4821 Permanent atrial fibrillation: Secondary | ICD-10-CM | POA: Diagnosis not present

## 2019-09-07 DIAGNOSIS — F0280 Dementia in other diseases classified elsewhere without behavioral disturbance: Secondary | ICD-10-CM | POA: Diagnosis not present

## 2019-09-07 DIAGNOSIS — G3183 Dementia with Lewy bodies: Secondary | ICD-10-CM | POA: Diagnosis not present

## 2019-09-08 ENCOUNTER — Encounter (HOSPITAL_COMMUNITY): Payer: Self-pay

## 2019-09-08 ENCOUNTER — Emergency Department (HOSPITAL_COMMUNITY): Payer: Medicare Other

## 2019-09-08 ENCOUNTER — Telehealth: Payer: Self-pay | Admitting: *Deleted

## 2019-09-08 ENCOUNTER — Telehealth: Payer: Self-pay | Admitting: Cardiology

## 2019-09-08 ENCOUNTER — Other Ambulatory Visit: Payer: Self-pay

## 2019-09-08 ENCOUNTER — Inpatient Hospital Stay (HOSPITAL_COMMUNITY)
Admission: EM | Admit: 2019-09-08 | Discharge: 2019-09-13 | DRG: 315 | Disposition: A | Discharge status: Skilled Nursing Facility | Payer: Medicare Other | Attending: Internal Medicine | Admitting: Internal Medicine

## 2019-09-08 DIAGNOSIS — R296 Repeated falls: Secondary | ICD-10-CM | POA: Diagnosis present

## 2019-09-08 DIAGNOSIS — I4821 Permanent atrial fibrillation: Secondary | ICD-10-CM | POA: Diagnosis not present

## 2019-09-08 DIAGNOSIS — E162 Hypoglycemia, unspecified: Secondary | ICD-10-CM

## 2019-09-08 DIAGNOSIS — Z993 Dependence on wheelchair: Secondary | ICD-10-CM

## 2019-09-08 DIAGNOSIS — I082 Rheumatic disorders of both aortic and tricuspid valves: Secondary | ICD-10-CM | POA: Diagnosis present

## 2019-09-08 DIAGNOSIS — Z7901 Long term (current) use of anticoagulants: Secondary | ICD-10-CM

## 2019-09-08 DIAGNOSIS — Z823 Family history of stroke: Secondary | ICD-10-CM

## 2019-09-08 DIAGNOSIS — E785 Hyperlipidemia, unspecified: Secondary | ICD-10-CM | POA: Diagnosis present

## 2019-09-08 DIAGNOSIS — I959 Hypotension, unspecified: Secondary | ICD-10-CM | POA: Diagnosis not present

## 2019-09-08 DIAGNOSIS — Z79899 Other long term (current) drug therapy: Secondary | ICD-10-CM

## 2019-09-08 DIAGNOSIS — Z800 Family history of malignant neoplasm of digestive organs: Secondary | ICD-10-CM

## 2019-09-08 DIAGNOSIS — Z833 Family history of diabetes mellitus: Secondary | ICD-10-CM

## 2019-09-08 DIAGNOSIS — Y92009 Unspecified place in unspecified non-institutional (private) residence as the place of occurrence of the external cause: Secondary | ICD-10-CM

## 2019-09-08 DIAGNOSIS — Z888 Allergy status to other drugs, medicaments and biological substances status: Secondary | ICD-10-CM

## 2019-09-08 DIAGNOSIS — G3183 Dementia with Lewy bodies: Secondary | ICD-10-CM | POA: Diagnosis not present

## 2019-09-08 DIAGNOSIS — R5381 Other malaise: Secondary | ICD-10-CM | POA: Diagnosis present

## 2019-09-08 DIAGNOSIS — F0280 Dementia in other diseases classified elsewhere without behavioral disturbance: Secondary | ICD-10-CM | POA: Diagnosis not present

## 2019-09-08 DIAGNOSIS — Z96653 Presence of artificial knee joint, bilateral: Secondary | ICD-10-CM | POA: Diagnosis present

## 2019-09-08 DIAGNOSIS — I4891 Unspecified atrial fibrillation: Secondary | ICD-10-CM | POA: Diagnosis present

## 2019-09-08 DIAGNOSIS — R001 Bradycardia, unspecified: Secondary | ICD-10-CM | POA: Diagnosis not present

## 2019-09-08 DIAGNOSIS — R4189 Other symptoms and signs involving cognitive functions and awareness: Secondary | ICD-10-CM | POA: Diagnosis not present

## 2019-09-08 DIAGNOSIS — N182 Chronic kidney disease, stage 2 (mild): Secondary | ICD-10-CM | POA: Diagnosis not present

## 2019-09-08 DIAGNOSIS — G4752 REM sleep behavior disorder: Secondary | ICD-10-CM

## 2019-09-08 DIAGNOSIS — G20 Parkinson's disease: Secondary | ICD-10-CM | POA: Diagnosis present

## 2019-09-08 DIAGNOSIS — R404 Transient alteration of awareness: Secondary | ICD-10-CM | POA: Diagnosis present

## 2019-09-08 DIAGNOSIS — Z20822 Contact with and (suspected) exposure to covid-19: Secondary | ICD-10-CM | POA: Diagnosis present

## 2019-09-08 DIAGNOSIS — I129 Hypertensive chronic kidney disease with stage 1 through stage 4 chronic kidney disease, or unspecified chronic kidney disease: Secondary | ICD-10-CM | POA: Diagnosis not present

## 2019-09-08 DIAGNOSIS — Z9842 Cataract extraction status, left eye: Secondary | ICD-10-CM

## 2019-09-08 DIAGNOSIS — Z9841 Cataract extraction status, right eye: Secondary | ICD-10-CM

## 2019-09-08 DIAGNOSIS — Z66 Do not resuscitate: Secondary | ICD-10-CM | POA: Diagnosis present

## 2019-09-08 DIAGNOSIS — F419 Anxiety disorder, unspecified: Secondary | ICD-10-CM | POA: Diagnosis present

## 2019-09-08 DIAGNOSIS — T50905A Adverse effect of unspecified drugs, medicaments and biological substances, initial encounter: Secondary | ICD-10-CM | POA: Diagnosis present

## 2019-09-08 DIAGNOSIS — N400 Enlarged prostate without lower urinary tract symptoms: Secondary | ICD-10-CM | POA: Diagnosis present

## 2019-09-08 DIAGNOSIS — I10 Essential (primary) hypertension: Secondary | ICD-10-CM | POA: Diagnosis present

## 2019-09-08 LAB — SARS CORONAVIRUS 2 BY RT PCR (HOSPITAL ORDER, PERFORMED IN ~~LOC~~ HOSPITAL LAB): SARS Coronavirus 2: NEGATIVE

## 2019-09-08 LAB — URINALYSIS, ROUTINE W REFLEX MICROSCOPIC
Bilirubin Urine: NEGATIVE
Glucose, UA: NEGATIVE mg/dL
Hgb urine dipstick: NEGATIVE
Ketones, ur: NEGATIVE mg/dL
Leukocytes,Ua: NEGATIVE
Nitrite: NEGATIVE
Protein, ur: NEGATIVE mg/dL
Specific Gravity, Urine: 1.006 (ref 1.005–1.030)
pH: 6 (ref 5.0–8.0)

## 2019-09-08 LAB — COMPREHENSIVE METABOLIC PANEL
ALT: 8 U/L (ref 0–44)
AST: 24 U/L (ref 15–41)
Albumin: 3.9 g/dL (ref 3.5–5.0)
Alkaline Phosphatase: 45 U/L (ref 38–126)
Anion gap: 10 (ref 5–15)
BUN: 16 mg/dL (ref 8–23)
CO2: 28 mmol/L (ref 22–32)
Calcium: 9.6 mg/dL (ref 8.9–10.3)
Chloride: 101 mmol/L (ref 98–111)
Creatinine, Ser: 1.25 mg/dL — ABNORMAL HIGH (ref 0.61–1.24)
GFR calc Af Amer: >60 mL/min (ref >60)
GFR calc non Af Amer: 55 mL/min — ABNORMAL LOW (ref >60)
Glucose, Bld: 40 mg/dL — CL (ref 70–99)
Potassium: 4.4 mmol/L (ref 3.5–5.1)
Sodium: 139 mmol/L (ref 135–145)
Total Bilirubin: 1.2 mg/dL (ref 0.3–1.2)
Total Protein: 6.8 g/dL (ref 6.5–8.1)

## 2019-09-08 LAB — CBC WITH DIFFERENTIAL/PLATELET
Abs Immature Granulocytes: 0.01 10*3/uL (ref 0.00–0.07)
Basophils Absolute: 0 10*3/uL (ref 0.0–0.1)
Basophils Relative: 1 %
Eosinophils Absolute: 0.1 10*3/uL (ref 0.0–0.5)
Eosinophils Relative: 2 %
HCT: 41.1 % (ref 39.0–52.0)
Hemoglobin: 13.4 g/dL (ref 13.0–17.0)
Immature Granulocytes: 0 %
Lymphocytes Relative: 17 %
Lymphs Abs: 0.7 10*3/uL (ref 0.7–4.0)
MCH: 32.1 pg (ref 26.0–34.0)
MCHC: 32.6 g/dL (ref 30.0–36.0)
MCV: 98.3 fL (ref 80.0–100.0)
Monocytes Absolute: 0.4 10*3/uL (ref 0.1–1.0)
Monocytes Relative: 11 %
Neutro Abs: 2.6 10*3/uL (ref 1.7–7.7)
Neutrophils Relative %: 69 %
Platelets: 223 10*3/uL (ref 150–400)
RBC: 4.18 MIL/uL — ABNORMAL LOW (ref 4.22–5.81)
RDW: 13.2 % (ref 11.5–15.5)
WBC: 3.9 10*3/uL — ABNORMAL LOW (ref 4.0–10.5)
nRBC: 0 % (ref 0.0–0.2)

## 2019-09-08 LAB — MAGNESIUM: Magnesium: 1.8 mg/dL (ref 1.7–2.4)

## 2019-09-08 LAB — TROPONIN I (HIGH SENSITIVITY)
Troponin I (High Sensitivity): 10 ng/L (ref <18)
Troponin I (High Sensitivity): 8 ng/L (ref <18)

## 2019-09-08 LAB — TSH: TSH: 1.054 u[IU]/mL (ref 0.350–4.500)

## 2019-09-08 LAB — CBG MONITORING, ED
Glucose-Capillary: 65 mg/dL — ABNORMAL LOW (ref 70–99)
Glucose-Capillary: 126 mg/dL — ABNORMAL HIGH (ref 70–99)

## 2019-09-08 MED ORDER — FAMOTIDINE 20 MG PO TABS
40.0000 mg | ORAL_TABLET | Freq: Every day | ORAL | Status: DC
  Administered 2019-09-09 – 2019-09-13 (×5): 40 mg via ORAL
  Filled 2019-09-08 (×5): qty 2

## 2019-09-08 MED ORDER — SODIUM CHLORIDE 0.9 % IV SOLN: 250.0000 mL | INTRAVENOUS | Status: DC | PRN

## 2019-09-08 MED ORDER — ACETAMINOPHEN 325 MG PO TABS: 650.0000 mg | ORAL_TABLET | Freq: Four times a day (QID) | ORAL | Status: DC | PRN

## 2019-09-08 MED ORDER — DEXTROSE-NACL 5-0.45 % IV SOLN: INTRAVENOUS | Status: AC

## 2019-09-08 MED ORDER — SODIUM CHLORIDE 0.9% FLUSH: 3.0000 mL | INTRAVENOUS | Status: DC | PRN

## 2019-09-08 MED ORDER — APIXABAN 5 MG PO TABS
5.0000 mg | ORAL_TABLET | Freq: Two times a day (BID) | ORAL | Status: DC
  Administered 2019-09-08 – 2019-09-13 (×10): 5 mg via ORAL
  Filled 2019-09-08 (×11): qty 1

## 2019-09-08 MED ORDER — VITAMIN D 25 MCG (1000 UNIT) PO TABS
1000.0000 [IU] | ORAL_TABLET | Freq: Every day | ORAL | Status: DC
  Administered 2019-09-08 – 2019-09-13 (×6): 1000 [IU] via ORAL
  Filled 2019-09-08 (×7): qty 1

## 2019-09-08 MED ORDER — PRAVASTATIN SODIUM 40 MG PO TABS
40.0000 mg | ORAL_TABLET | Freq: Every day | ORAL | Status: DC
  Administered 2019-09-09 – 2019-09-13 (×5): 40 mg via ORAL
  Filled 2019-09-08 (×5): qty 1

## 2019-09-08 MED ORDER — ACETAMINOPHEN 650 MG RE SUPP: 650.0000 mg | Freq: Four times a day (QID) | RECTAL | Status: DC | PRN

## 2019-09-08 MED ORDER — DEXTROSE 50 % IV SOLN
50.0000 mL | Freq: Once | INTRAVENOUS | Status: AC
  Administered 2019-09-08: 50 mL via INTRAVENOUS
  Filled 2019-09-08: qty 50

## 2019-09-08 MED ORDER — DEXTROSE-NACL 5-0.45 % IV SOLN: INTRAVENOUS | Status: DC

## 2019-09-08 MED ORDER — SODIUM CHLORIDE 0.9% FLUSH
3.0000 mL | Freq: Two times a day (BID) | INTRAVENOUS | Status: DC
  Administered 2019-09-08 – 2019-09-13 (×5): 3 mL via INTRAVENOUS

## 2019-09-08 MED ORDER — SODIUM CHLORIDE 0.9% FLUSH
3.0000 mL | Freq: Two times a day (BID) | INTRAVENOUS | Status: DC
  Administered 2019-09-12 – 2019-09-13 (×3): 3 mL via INTRAVENOUS

## 2019-09-08 NOTE — ED Provider Notes (Signed)
Emergency Department Provider Note   I have reviewed the triage vital signs and the nursing notes.   HISTORY  Chief Complaint Altered Mental Status, Hypotension, and Bradycardia   HPI Brian [name] is a 79 y.o. male with past history reviewed below including atrial fibrillation on Eliquis and Parkinson's disease presents with episode of unresponsiveness at home.  Patient states he was having a normal morning going about his daily routine when he suddenly next remembers EMS in his living room.  He apparently had a episode of unresponsiveness which prompted 911 call by his wife.  EMS found him to be drowsy and responsive to painful stimuli with initial blood pressure of 70/44.  The noted an irregular, bradycardic rhythm.  No report of seizure-like activity.  He became more awake and alert and vital signs normalized in route.  Patient denies any pain such as sudden severe headache, chest discomfort, shortness of breath.  No abdominal or back pain.  No unilateral weakness or numbness.  He is not aware of any new medications.    Past Medical History:  Diagnosis Date  . Abnormal PFT 1. 05/18/08  2. 11/30/08   1. Showed mild airflow obstruction, mild restriction, mild diffusion defect; FEV1 2.22(64%), FVC 3.33(65%), FEVi% 67, TLC 5.19(69%), DLCO 77%, +BD  2. FEV1 2.38(73%), FVC 3.81(80%), FEV1% 63, TLC 5.61(80%), DLCO 79%, no BD  . Allergic rhinitis   . Anxiety   . BPH (benign prostatic hyperplasia)   . Cellulitis    right leg MRSA  . Erectile dysfunction   . GERD (gastroesophageal reflux disease)   . Heart murmur    mild MR by echo  . Hyperlipidemia   . Hypertension   . Osteoarthritis   . Paresthesias 06/22/2012  . Parkinsonism ([place]) 06/22/2012  . Permanent atrial fibrillation ([place])   . Restrictive lung disease     Patient Active Problem List   Diagnosis Date Noted  . Unresponsiveness 09/09/2019  . Bradycardia   . Unresponsive episode 09/08/2019  . Hypoglycemia without diagnosis of  diabetes mellitus 09/08/2019  . Rupture of right triceps tendon 05/20/2017  . Triceps tendon rupture, right, initial encounter 05/20/2017  . Atrial fibrillation ([place]) [I48.91] 02/05/2017  . Urinary retention 12/31/2015  . Acute renal failure (ARF) ([place]) 12/31/2015  . Acute encephalopathy 12/31/2015  . Primary osteoarthritis of right knee 07/12/2015  . Primary osteoarthritis of left knee 11/30/2014  . Encounter for therapeutic drug monitoring 06/21/2013  . Edema of extremities 02/04/2013  . Chronic anticoagulation 02/04/2013  . GERD (gastroesophageal reflux disease)   . Restrictive lung disease   . CKD (chronic kidney disease) stage 2, GFR 60-89 ml/min   . Cellulitis   . Allergic rhinitis   . Anxiety   . Brian [name] term (current) use of anticoagulants 01/25/2013  . Parkinsonism ([place]) 06/22/2012  . Paresthesias 06/22/2012  . PULMONARY NODULE 06/02/2008  . SNORING 06/02/2008  . Hyperlipidemia 05/15/2008  . Essential hypertension 05/15/2008  . PULMONARY FUNCTION TESTS, ABNORMAL 05/15/2008    Past Surgical History:  Procedure Laterality Date  . CARDIAC CATHETERIZATION  11/2009   normal coronary arteries  . CARDIOVERSION     multiple  . CATARACT EXTRACTION    . COLONOSCOPY    . COLONOSCOPY WITH PROPOFOL N/A 09/02/2016   Procedure: COLONOSCOPY WITH PROPOFOL;  Surgeon: Garlan Fair, MD;  Location: [facility];  Service: Endoscopy;  Laterality: N/A;  . EYE SURGERY     bilateral cataract extraction  . JOINT REPLACEMENT    . TOTAL KNEE  ARTHROPLASTY Left 11/30/2014   Procedure: TOTAL KNEE ARTHROPLASTY;  Surgeon: Susa Day, MD;  Location: [facility];  Service: Orthopedics;  Laterality: Left;  . TOTAL KNEE ARTHROPLASTY Right 07/12/2015   Procedure: RIGHT TOTAL KNEE ARTHROPLASTY;  Surgeon: Susa Day, MD;  Location: [facility];  Service: Orthopedics;  Laterality: Right;  . TRICEPS TENDON REPAIR Right 05/20/2017  . TRICEPS TENDON REPAIR Right 05/20/2017   Procedure: TRICEPS TENDON REPAIR;   Surgeon: Iran Planas, MD;  Location: [facility];  Service: Orthopedics;  Laterality: Right;    Allergies Finasteride and Lisinopril  Family History  Problem Relation Age of Onset  . Diabetes Mother        DM  . Stroke Father        CVA  . Pancreatic cancer Brother   . Pancreatic cancer Other        Nephew    Social History Social History   Tobacco Use  . Smoking status: Never Smoker  . Smokeless tobacco: Never Used  Vaping Use  . Vaping Use: Never used  Substance Use Topics  . Alcohol use: No    Alcohol/week: 0.0 standard drinks  . Drug use: No    Review of Systems  Constitutional: No fever/chills Eyes: No visual changes. ENT: No sore throat. Cardiovascular: Denies chest pain. Unresponsive episode.  Respiratory: Denies shortness of breath. Gastrointestinal: No abdominal pain.  No nausea, no vomiting.  No diarrhea.  No constipation. Genitourinary: Negative for dysuria. Musculoskeletal: Negative for back pain. Skin: Negative for rash. Neurological: Negative for headaches, focal weakness or numbness.  10-point ROS otherwise negative.  ____________________________________________   PHYSICAL EXAM:  VITAL SIGNS: ED Triage Vitals  Enc Vitals Group     BP 09/08/19 1338 133/83     Pulse Rate 09/08/19 1338 60     Resp 09/08/19 1338 11     Temp 09/08/19 1330 97.9 F (36.6 C)     Temp Source 09/08/19 1330 Oral     SpO2 09/08/19 1338 99 %     Weight 09/08/19 1332 219 lb 12.8 oz (99.7 kg)     Height 09/08/19 1332 6\' 1"  (1.854 m)   Constitutional: Alert and oriented. Well appearing and in no acute distress. Eyes: Conjunctivae are normal. Head: Atraumatic. Nose: No congestion/rhinnorhea. Mouth/Throat: Mucous membranes are moist. No tongue laceration or abrasion.  Neck: No stridor.   Cardiovascular: Bradycardia (a-fib vs ectopic rhythm on monitor) Good peripheral circulation. Grossly normal heart sounds.   Respiratory: Normal respiratory effort.  No retractions.  Lungs CTAB. Gastrointestinal: Soft and nontender. No distention.  Musculoskeletal: No lower extremity tenderness nor edema. No gross deformities of extremities. Neurologic:  Normal speech and language. Slow movements but gross intact. No weakness/numbness.  Skin:  Skin is warm, dry and intact. No rash noted.  ____________________________________________   LABS (all labs ordered are listed, but only abnormal results are displayed)  Labs Reviewed  COMPREHENSIVE METABOLIC PANEL - Abnormal; Notable for the following components:      Result Value   Glucose, Bld 40 (*)    Creatinine, Ser 1.25 (*)    GFR calc non Af Amer 55 (*)    All other components within normal limits  CBC WITH DIFFERENTIAL/PLATELET - Abnormal; Notable for the following components:   WBC 3.9 (*)    RBC 4.18 (*)    All other components within normal limits  URINALYSIS, ROUTINE W REFLEX MICROSCOPIC - Abnormal; Notable for the following components:   Color, Urine STRAW (*)    All other components  within normal limits  CBC - Abnormal; Notable for the following components:   RBC 4.14 (*)    All other components within normal limits  GLUCOSE, CAPILLARY - Abnormal; Notable for the following components:   Glucose-Capillary 105 (*)    All other components within normal limits  GLUCOSE, CAPILLARY - Abnormal; Notable for the following components:   Glucose-Capillary 121 (*)    All other components within normal limits  GLUCOSE, CAPILLARY - Abnormal; Notable for the following components:   Glucose-Capillary 109 (*)    All other components within normal limits  GLUCOSE, CAPILLARY - Abnormal; Notable for the following components:   Glucose-Capillary 104 (*)    All other components within normal limits  GLUCOSE, CAPILLARY - Abnormal; Notable for the following components:   Glucose-Capillary 135 (*)    All other components within normal limits  CBG MONITORING, ED - Abnormal; Notable for the following components:    Glucose-Capillary 65 (*)    All other components within normal limits  CBG MONITORING, ED - Abnormal; Notable for the following components:   Glucose-Capillary 126 (*)    All other components within normal limits  SARS CORONAVIRUS 2 BY RT PCR (HOSPITAL ORDER, [facility])  SARS CORONAVIRUS 2 BY RT PCR (HOSPITAL ORDER, [place] [facility])  MAGNESIUM  TSH  BASIC METABOLIC PANEL  BETA-HYDROXYBUTYRIC ACID  HEMOGLOBIN A1C  CORTISOL-AM, BLOOD  C-PEPTIDE  INSULIN, RANDOM  GLUCOSE, CAPILLARY  GLUCOSE, CAPILLARY  GLUCOSE, CAPILLARY  GLUCOSE, CAPILLARY  GLUCOSE, CAPILLARY  INSULIN, RANDOM  C-PEPTIDE  PROINSULIN/INSULIN RATIO  PROINSULIN/INSULIN RATIO  CBG MONITORING, ED  CBG MONITORING, ED  CBG MONITORING, ED  CBG MONITORING, ED  CBG MONITORING, ED  CBG MONITORING, ED  CBG MONITORING, ED  CBG MONITORING, ED  CBG MONITORING, ED  CBG MONITORING, ED  CBG MONITORING, ED  CBG MONITORING, ED  CBG MONITORING, ED  CBG MONITORING, ED  CBG MONITORING, ED  TROPONIN I (HIGH SENSITIVITY)  TROPONIN I (HIGH SENSITIVITY)   ____________________________________________  EKG   EKG Interpretation  Date/Time:  Thursday September 08 2019 13:38:04 EDT Ventricular Rate:  60 PR Interval:    QRS Duration: 94 QT Interval:  445 QTC Calculation: 406 R Axis:   -28 Text Interpretation: Sinus or ectopic atrial rhythm Atrial premature complexes in couplets Borderline left axis deviation Nonspecific T abnormalities, diffuse leads No STEMI Confirmed by Nanda Quinton [phone]) on 09/08/2019 2:20:59 PM       ____________________________________________  RADIOLOGY  CXR reviewed.  ____________________________________________   PROCEDURES  Procedure(s) performed:   Procedures  CRITICAL CARE Performed by: Margette Fast Total critical care time: 35 minutes Critical care time was exclusive of separately billable procedures and treating other  patients. Critical care was necessary to treat or prevent imminent or life-threatening deterioration. Critical care was time spent personally by me on the following activities: development of treatment plan with patient and/or surrogate as well as nursing, discussions with consultants, evaluation of patient's response to treatment, examination of patient, obtaining history from patient or surrogate, ordering and performing treatments and interventions, ordering and review of laboratory studies, ordering and review of radiographic studies, pulse oximetry and re-evaluation of patient's condition.  Nanda Quinton, MD Emergency Medicine  ____________________________________________   INITIAL IMPRESSION / ASSESSMENT AND PLAN / ED COURSE  Pertinent labs & imaging results that were available during my care of the patient were reviewed by me and considered in my medical decision making (see chart for details).  Patient presents emergency department with bradycardia, hypotension, episode of unresponsiveness.  Patient's blood pressure and mental status have improved significantly.  He intermittently has bradycardia dropping into the 30s with Trig Mcbryar pauses between his irregular beats.  EKG reviewed and interpreted by me as above.  No active chest pain.  No prodromal symptoms prior to passing out.  Wife is apparently in route to the emergency department for additional information.   02:28 PM  Spoke with Cardiology regarding the presentation and bradycardia. They will be down to evaluate.   03:30 PM  Patient's wife is now at bedside.  She describes the earlier today he was unresponsive with eyes closed.  No seizure activity.  They tried to wake him with voice and by rubbing a cool washcloth over his face.  Not respond.  He called out to EMS and he seemed more groggy but still not very responsive with low blood pressures at that time.  She agrees that he had a normal day.  He does not take insulin.  On reassess  the patient's heart rate is improved now in the 70s to 80s as opposed to 30s/40s.  Patient is also hypoglycemic.  No evidence of infection at this time.  CT head is pending along with UA.  I have added TSH.  Patient remains at mental status baseline.  CXR and labs reviewed. Care transferred to Dr. Regenia Skeeter pending CT head and then likely admit.  ____________________________________________  FINAL CLINICAL IMPRESSION(S) / ED DIAGNOSES  Final diagnoses:  Unresponsive episode  Bradycardia     MEDICATIONS GIVEN DURING THIS VISIT:  Medications  sodium chloride flush (NS) 0.9 % injection 3 mL (3 mLs Intravenous Given 09/10/19 2203)  sodium chloride flush (NS) 0.9 % injection 3 mL (has no administration in time range)  0.9 %  sodium chloride infusion (has no administration in time range)  sodium chloride flush (NS) 0.9 % injection 3 mL (0 mLs Intravenous Duplicate 9/73/53 2992)  acetaminophen (TYLENOL) tablet 650 mg (has no administration in time range)    Or  acetaminophen (TYLENOL) suppository 650 mg (has no administration in time range)  dextrose 5 %-0.45 % sodium chloride infusion ( Intravenous Stopped 09/09/19 0748)  apixaban (ELIQUIS) tablet 5 mg (5 mg Oral Given 09/11/19 1000)  cholecalciferol (VITAMIN D3) tablet 1,000 Units (1,000 Units Oral Given 09/11/19 1000)  pravastatin (PRAVACHOL) tablet 40 mg (40 mg Oral Given 09/11/19 1000)  famotidine (PEPCID) tablet 40 mg (40 mg Oral Given 09/11/19 0959)  clonazePAM (KLONOPIN) tablet 0.5 mg (0.5 mg Oral Given 09/10/19 2204)  losartan (COZAAR) tablet 25 mg (25 mg Oral Given 09/11/19 1000)  escitalopram (LEXAPRO) tablet 10 mg (10 mg Oral Given 09/10/19 2204)  rivastigmine (EXELON) capsule 4.5 mg (4.5 mg Oral Given 09/11/19 0959)  tamsulosin (FLOMAX) capsule 0.4 mg (0.4 mg Oral Given 09/10/19 2204)  carbidopa-levodopa (SINEMET CR) 50-200 MG per tablet controlled release 2 tablet (2 tablets Oral Given 09/10/19 2204)  carbidopa-levodopa (SINEMET IR)  25-100 MG per tablet immediate release 2 tablet (2 tablets Oral Given 09/11/19 1639)  potassium chloride (KLOR-CON) CR tablet 20 mEq (20 mEq Oral Given 09/11/19 1000)  artificial tears (LACRILUBE) ophthalmic ointment (has no administration in time range)  dextrose 50 % solution 50 mL (50 mLs Intravenous Given 09/08/19 1545)  sodium chloride 0.9 % bolus 500 mL (0 mLs Intravenous Stopped 09/11/19 0253)     Note:  This document was prepared using Dragon voice recognition software and may include unintentional dictation errors.  Nanda Quinton, MD, [facility]  Emergency Medicine    Mosetta Ferdinand, [facility], MD 09/11/19 [phone]

## 2019-09-08 NOTE — Telephone Encounter (Signed)
New Message:    Timmothy Sours, Occupational Therapist with [facility] called. He said when he arrived at the pt's home today his blood pressure was 74/48 and heart rate was 62. Daughter took blood pressure at 11:00 A.M. it was 100/61. EMS arrived and blood pressure was 70/40 and heart rate was 50. Pt was transported to [facility]. This is the second time in 2 weeks that pt was transported to the hospital. Any questions you can call Timmothy Sours.

## 2019-09-08 NOTE — Consult Note (Signed)
Cardiology Consultation:   Patient ID: Brian [name] MRN: [id number]; DOB: 04-14-1940  Admit date: 09/08/2019 Date of Consult: 09/08/2019  Primary Care Provider: Wenda Low, MD [facility] Cardiologist: Fransico Him, MD   Patient Profile:   Brian [name] is a 79 y.o. male with a hx of permanent atrial fibrillation on Eliquis for anticoagulation, hypertension, Parkinson disease, chronic lower extremity edema and hyperlipidemia who is being seen today for the evaluation of bradycardia and hypertension at the request of Dr. Verta Ellen.  Last seen by Dr. Radford Pax November 2020.  He was seen in ER 08/30/2019 for hypotension at home however blood pressure was normal in ER and he was asymptomatic.  He was wheelchair bound so unable to get orthostatic.  He was discharged from ER.  CT of head: IMPRESSION: 1. Generalized cerebral atrophy. 2. Small amount of soft tissue air adjacent to the superior and anterolateral aspects of the right sphenoid bone which may represent sequelae associated with an underlying fracture. MRI correlation is recommended if this is of clinical concern.   History of Present Illness:   Brian [name] has become wheelchair-bound for the past 4 to 6 weeks.  He has stopped using walker.  Per wife patient having vision and balance issue.  Not interactive as much.  He was found unresponsive by wife and therapist.  Last seen normal by wife.  Patient was seen in his bedroom and on wheelchair.  Occupational Therapy came patient found unresponsive to cold.  His blood pressure was 74/48 pulse of 60.  EMS was called and he was drowsy and hard to get responsive.  Noted blood pressure of 70/40 and pulse of 50.  He was brought to ER for further evaluation.  He became more alert in route.    Serum creatinine 1.25.  High-sensitivity troponin normal x 2.  Hemoglobin 13.4.  Glucose 40. Chest  X-ray without acute finding. COVID negative.   Blood pressure 133/83 on arrival.  Now 154/80.  He is  getting dextrose fluid.  Heart rate in 50s with intermittent dropping to 30s.  He takes his metoprolol and losartan in evening.  Past Medical History:  Diagnosis Date  . Abnormal PFT 1. 05/18/08  2. 11/30/08   1. Showed mild airflow obstruction, mild restriction, mild diffusion defect; FEV1 2.22(64%), FVC 3.33(65%), FEVi% 67, TLC 5.19(69%), DLCO 77%, +BD  2. FEV1 2.38(73%), FVC 3.81(80%), FEV1% 63, TLC 5.61(80%), DLCO 79%, no BD  . Allergic rhinitis   . Anxiety   . BPH (benign prostatic hyperplasia)   . Cellulitis    right leg MRSA  . Erectile dysfunction   . GERD (gastroesophageal reflux disease)   . Heart murmur    mild MR by echo  . Hyperlipidemia   . Hypertension   . Osteoarthritis   . Paresthesias 06/22/2012  . Parkinsonism ([place]) 06/22/2012  . Permanent atrial fibrillation ([place])   . Restrictive lung disease     Past Surgical History:  Procedure Laterality Date  . CARDIAC CATHETERIZATION  11/2009   normal coronary arteries  . CARDIOVERSION     multiple  . CATARACT EXTRACTION    . COLONOSCOPY    . COLONOSCOPY WITH PROPOFOL N/A 09/02/2016   Procedure: COLONOSCOPY WITH PROPOFOL;  Surgeon: Garlan Fair, MD;  Location: [facility];  Service: Endoscopy;  Laterality: N/A;  . EYE SURGERY     bilateral cataract extraction  . JOINT REPLACEMENT    . TOTAL KNEE ARTHROPLASTY Left 11/30/2014   Procedure: TOTAL KNEE ARTHROPLASTY;  Surgeon: Dellis Filbert  Tonita Cong, MD;  Location: [facility];  Service: Orthopedics;  Laterality: Left;  . TOTAL KNEE ARTHROPLASTY Right 07/12/2015   Procedure: RIGHT TOTAL KNEE ARTHROPLASTY;  Surgeon: Susa Day, MD;  Location: [facility];  Service: Orthopedics;  Laterality: Right;  . TRICEPS TENDON REPAIR Right 05/20/2017  . TRICEPS TENDON REPAIR Right 05/20/2017   Procedure: TRICEPS TENDON REPAIR;  Surgeon: Iran Planas, MD;  Location: [place];  Service: Orthopedics;  Laterality: Right;     Inpatient Medications: Scheduled Meds:  Continuous Infusions: . dextrose 5 % and  0.45% NaCl 125 mL/hr at 09/08/19 1548   PRN Meds:  Allergies:    Allergies  Allergen Reactions  . Finasteride Other (See Comments)    More frequent urination.  . Lisinopril Cough    Social History:   Social History   Socioeconomic History  . Marital status: Married    Spouse name: Brian [name]  . Number of children: 4  . Years of education: masters  . Highest education level: Not on file  Occupational History  . Occupation: Chief of Staff    Comment: Worked with [employer]  Tobacco Use  . Smoking status: Never Smoker  . Smokeless tobacco: Never Used  Vaping Use  . Vaping Use: Never used  Substance and Sexual Activity  . Alcohol use: No    Alcohol/week: 0.0 standard drinks  . Drug use: No  . Sexual activity: Not on file  Other Topics Concern  . Not on file  Social History Narrative   Married and lives in [place].  Retired Optometrist.   No reported caffeine use.    Social Determinants of Health   Financial Resource Strain:   . Difficulty of Paying Living Expenses:   Food Insecurity:   . Worried About Charity fundraiser in the Last Year:   . Arboriculturist in the Last Year:   Transportation Needs:   . Film/video editor (Medical):   Marland Kitchen Lack of Transportation (Non-Medical):   Physical Activity:   . Days of Exercise per Week:   . Minutes of Exercise per Session:   Stress:   . Feeling of Stress :   Social Connections:   . Frequency of Communication with Friends and Family:   . Frequency of Social Gatherings with Friends and Family:   . Attends Religious Services:   . Active Member of Clubs or Organizations:   . Attends Archivist Meetings:   Marland Kitchen Marital Status:   Intimate Partner Violence:   . Fear of Current or Ex-Partner:   . Emotionally Abused:   Marland Kitchen Physically Abused:   . Sexually Abused:     Family History:   Family History  Problem Relation Age of Onset  . Diabetes Mother        DM  . Stroke Father        CVA  . Pancreatic cancer  Brother   . Pancreatic cancer Other        Nephew     ROS:  Please see the history of present illness.  All other ROS reviewed and negative.     Physical Exam/Data:   Vitals:   09/08/19 1338 09/08/19 1400 09/08/19 1415 09/08/19 1445  BP: 133/83 (!) 130/92 109/73 (!) 154/80  Pulse: 60 (!) 56 (!) 41 68  Resp: 11 13 15 14   Temp: 97.7 F (36.5 C)     TempSrc:      SpO2: 99% 100% 99% 100%  Weight:      Height:  No intake or output data in the 24 hours ending 09/08/19 1555 Last 3 Weights 09/08/2019 06/15/2019 02/10/2019  Weight (lbs) 219 lb 12.8 oz (No Data) 219 lb 12.8 oz  Weight (kg) 99.7 kg (No Data) 99.701 kg     Body mass index is 29 kg/m.  General: Ill-appearing male in no acute distress HEENT: normal Lymph: no adenopathy Neck: no JVD Endocrine:  No thryomegaly Vascular: No carotid bruits; FA pulses 2+ bilaterally without bruits  Cardiac:  normal S1, S2; IR IR ; no murmur  Lungs:  clear to auscultation bilaterally, no wheezing, rhonchi or rales  Abd: soft, nontender, no hepatomegaly  Ext: no edema Musculoskeletal:  No deformities, BUE and BLE strength normal and equal Skin: warm and dry  Neuro:  CNs 2-12 intact, no focal abnormalities noted Psych:  Normal affect   EKG:  The EKG was personally reviewed and demonstrates: Atrial fibrillation slow ventricular rate (read as ectopic or sinus on strip) Telemetry:  Telemetry was personally reviewed and demonstrates: Atrial fibrillation at rate of 50 to 60s with intermittent drop in 30s  Relevant CV Studies:  Echo 12/2015 Study Conclusions   - Left ventricle: The cavity size was normal. Wall thickness was  increased in a pattern of mild LVH. Systolic function was  vigorous. The estimated ejection fraction was in the range of 65%  to 70%. Wall motion was normal; there were no regional wall  motion abnormalities.  - Left atrium: The atrium was severely dilated.  - Right atrium: The atrium was mildly dilated.   - Pulmonary arteries: Systolic pressure was mildly increased.   Impressions:   - Normal LV systolic function; mild LVH; severe LAE; mild RAE; mild  TR with mildly elevaeted pulmonary pressure.   Laboratory Data:  High Sensitivity Troponin:   Recent Labs  Lab 09/08/19 1348  TROPONINIHS 10     Chemistry Recent Labs  Lab 09/08/19 1348  NA 139  K 4.4  CL 101  CO2 28  GLUCOSE 40*  BUN 16  CREATININE 1.25*  CALCIUM 9.6  GFRNONAA 55*  GFRAA >60  ANIONGAP 10    Recent Labs  Lab 09/08/19 1348  PROT 6.8  ALBUMIN 3.9  AST 24  ALT 8  ALKPHOS 45  BILITOT 1.2   Hematology Recent Labs  Lab 09/08/19 1348  WBC 3.9*  RBC 4.18*  HGB 13.4  HCT 41.1  MCV 98.3  MCH 32.1  MCHC 32.6  RDW 13.2  PLT 223    Radiology/Studies:  DG Chest Portable 1 View  Result Date: 09/08/2019 CLINICAL DATA:  79 year old male found unresponsive in hypotensive. EXAM: PORTABLE CHEST 1 VIEW COMPARISON:  Chest CT 11/30/2008 and earlier. FINDINGS: Portable AP upright view at 1414 hours. Mild chronic cardiomegaly. Other mediastinal contours are within normal limits. Visualized tracheal air column is within normal limits. Allowing for portable technique the lungs are clear. No pneumothorax or pleural effusion. Negative visible bowel gas pattern and osseous structures. IMPRESSION: Mild chronic cardiomegaly.  No acute cardiopulmonary abnormality. Electronically Signed   By: Genevie Ann M.D.   On: 09/08/2019 14:38   { Assessment and Plan:   1. Unresponsiveness 2. Hypotension 3. Transient bradycardia 4. Parkinson disease 5. Persistent atrial fibrillation 6. Hypoglycemia  Patient has gradual decline in his health for past 4 to 6 weeks.  He has stopped using walker.  Now mostly wheelchair bound. Patient was found unresponsive and hard to arouse by wife and occupational therapist.  He was found to have low pulse and  50 to 60s and blood pressure in 70s over 40s.  Blood sugar was 40 in emergency room.  Now  his blood pressure has been improved after getting dextrose solution.  He takes his metoprolol and losartan in evening. On Eliquis for anticoagulation.  CT of head 6/8 concerning for underlying fracture.  His unresponsiveness likely due to hypoglycemia/hypotension and slow heart rate.  Troponin negative. Seems he is back to his baseline after getting fluids.  Wife reports patient eating 3 meals per day.  Likely internal medicine admission for further evaluation. Hold BB given bradycardia.   Dr. Harrell Gave to see.   For questions or updates, please contact [place] Please consult [email or web address] for contact info under    Jarrett Soho, PA  09/08/2019 3:55 PM

## 2019-09-08 NOTE — H&P (Signed)
History and Physical    Brian [name]:[id number] DOB: 12-Sep-1940 DOA: 09/08/2019  PCP: Wenda Low, MD  Patient coming from: Home via EMS  I have personally briefly reviewed patient's old medical records in [place]  Chief Complaint: Unresponsive episode  HPI: Brian [name] is a 79 y.o. male with medical history significant for permanent atrial fibrillation on Eliquis, parkinsonism, hypertension, hyperlipidemia, anxiety, and BPH who presents to the ED for evaluation after an unresponsive episode.  Patient does not recall episode therefore majority history is supplemented by wife at bedside, EDP, and chart review.  Patient was recently seen in the ED on 08/30/2019 for a hypotensive episode without loss of consciousness.  He was brought to the ED for evaluation at which time lab work was reassuring, urinalysis negative for UTI, and CT head without contrast showed generalized cerebral atrophy and small amount of soft tissue air adjacent to the superior and anterolateral aspects of the right sphenoid bone.  He was discharged home.  Earlier today (09/08/2019) patient ate and tolerated lunch around 11 AM.  He was sitting down in the den in his wheelchair when his Occupational Therapy came to the house and found him unresponsive and cold.  He was noted to have a low blood pressure of 74/48 and pulse of 62.  They were unable to wake him or achieve any response.  EMS were called and per ED documentation blood pressure was 70/40 with heart rate of 50.  He was brought to the ED for further evaluation and initially only responsive to painful stimuli on arrival but now has returned to his baseline.  He has not had any recent changes in his medications other than an increase in rivastigmine back to 4.5 mg twice daily (had been taking 1.5 mg twice daily due to confusion regarding dosing) about 3 weeks ago.  He has no known history of diabetes and is not on insulin or antidiabetic medications.  Wife  states he has been eating well, at least 3 meals per day recently.  Patient currently feels at his baseline and denies any recent chest pain, palpitations, dyspnea, abdominal pain.  He reports frequent urine output but denies any dysuria.  He has not had any diarrhea.  He had 2 episodes of vomiting 1 week ago.  ED Course:  Initial vitals showed BP 130/92, pulse 54, RR 13, temp 97.7 Fahrenheit, SPO2 100% on room air.  Per EDP heart rate was down to 30-40s.  Labs are notable for sodium 139, potassium 4.4, magnesium 1.8, bicarb 28, BUN 16, creatinine 1.25, serum glucose 40, LFTs within normal limits, WBC 3.9, hemoglobin 13.4, platelets 223,000, high-sensitivity troponin I 10 >> 8.  TSH 1.054.  SARS-CoV-2 PCR is negative.  Portable chest x-ray is negative for focal consolidation, edema, or effusion.  CT head without contrast is negative for acute intracranial abnormality.  Atrophy and chronic small vessel ischemic changes of white matter is noted.  Patient was given 1 amp of IV D50 and placed on maintenance IVF with D5-1/2 NS with symptomatic improvement.  Cardiology were consulted and recommended medication adjustment and will follow.  The hospitalist service was consulted to me for further evaluation and management.  Review of Systems: All systems reviewed and are negative except as documented in history of present illness above.   Past Medical History:  Diagnosis Date  . Abnormal PFT 1. 05/18/08  2. 11/30/08   1. Showed mild airflow obstruction, mild restriction, mild diffusion defect; FEV1 2.22(64%), FVC 3.33(65%),  FEVi% 67, TLC 5.19(69%), DLCO 77%, +BD  2. FEV1 2.38(73%), FVC 3.81(80%), FEV1% 63, TLC 5.61(80%), DLCO 79%, no BD  . Allergic rhinitis   . Anxiety   . BPH (benign prostatic hyperplasia)   . Cellulitis    right leg MRSA  . Erectile dysfunction   . GERD (gastroesophageal reflux disease)   . Heart murmur    mild MR by echo  . Hyperlipidemia   . Hypertension   . Osteoarthritis     . Paresthesias 06/22/2012  . Parkinsonism ([place]) 06/22/2012  . Permanent atrial fibrillation ([place])   . Restrictive lung disease     Past Surgical History:  Procedure Laterality Date  . CARDIAC CATHETERIZATION  11/2009   normal coronary arteries  . CARDIOVERSION     multiple  . CATARACT EXTRACTION    . COLONOSCOPY    . COLONOSCOPY WITH PROPOFOL N/A 09/02/2016   Procedure: COLONOSCOPY WITH PROPOFOL;  Surgeon: Garlan Fair, MD;  Location: [facility];  Service: Endoscopy;  Laterality: N/A;  . EYE SURGERY     bilateral cataract extraction  . JOINT REPLACEMENT    . TOTAL KNEE ARTHROPLASTY Left 11/30/2014   Procedure: TOTAL KNEE ARTHROPLASTY;  Surgeon: Susa Day, MD;  Location: [facility];  Service: Orthopedics;  Laterality: Left;  . TOTAL KNEE ARTHROPLASTY Right 07/12/2015   Procedure: RIGHT TOTAL KNEE ARTHROPLASTY;  Surgeon: Susa Day, MD;  Location: [facility];  Service: Orthopedics;  Laterality: Right;  . TRICEPS TENDON REPAIR Right 05/20/2017  . TRICEPS TENDON REPAIR Right 05/20/2017   Procedure: TRICEPS TENDON REPAIR;  Surgeon: Iran Planas, MD;  Location: [facility];  Service: Orthopedics;  Laterality: Right;    Social History:  reports that he has never smoked. He has never used smokeless tobacco. He reports that he does not drink alcohol and does not use drugs.  Allergies  Allergen Reactions  . Finasteride Other (See Comments)    More frequent urination.  . Lisinopril Cough    Family History  Problem Relation Age of Onset  . Diabetes Mother        DM  . Stroke Father        CVA  . Pancreatic cancer Brother   . Pancreatic cancer Other        Nephew     Prior to Admission medications   Medication Sig Start Date End Date Taking? Authorizing Provider  apixaban (ELIQUIS) 5 MG TABS tablet Take 1 tablet (5 mg total) by mouth 2 (two) times daily. 12/31/18  Yes Turner, Eber Hong, MD  carbidopa-levodopa (SINEMET CR) 50-200 MG tablet Take 2 tablets by mouth at bedtime. 06/15/19  Yes  Star Age, MD  carbidopa-levodopa (SINEMET IR) 25-100 MG tablet 2 pills 6 times a day, starting at 7 AM, every 2 1/2 hours Patient taking differently: Take 2 tablets by mouth See admin instructions. Takes 2 tablets by mouth 6 times a day, starting at 7 AM, every 2 1/2 hours. 06/15/19  Yes Star Age, MD  cholecalciferol (VITAMIN D) 1000 units tablet Take 1,000 Units by mouth daily.    Yes [provider]  clonazePAM (KLONOPIN) 0.5 MG disintegrating tablet Take 1 tablet (0.5 mg total) by mouth at bedtime. 06/15/19  Yes Star Age, MD  doxazosin (CARDURA) 2 MG tablet Take 2 mg by mouth at bedtime.    Yes [provider]  escitalopram (LEXAPRO) 10 MG tablet Take 1 tablet (10 mg total) by mouth at bedtime. 06/15/19  Yes Star Age, MD  famotidine (PEPCID) 40 MG  tablet Take 40 mg by mouth daily.    Yes [provider]  furosemide (LASIX) 40 MG tablet Take 40 mg by mouth 2 (two) times daily. 02/20/18  Yes [provider]  losartan (COZAAR) 50 MG tablet Take 50 mg by mouth every morning.   Yes [provider]  metoprolol succinate (TOPROL-XL) 25 MG 24 hr tablet Take 25 mg by mouth daily.  04/16/16  Yes [provider]  Multiple Vitamin (MULTIVITAMIN WITH MINERALS) TABS tablet Take 1 tablet by mouth daily.    Yes [provider]  potassium chloride (KLOR-CON 10) 10 MEQ tablet Take 20 mEq by mouth daily.    Yes [provider]  pravastatin (PRAVACHOL) 40 MG tablet Take 40 mg by mouth daily.    Yes [provider]  psyllium (METAMUCIL) 58.6 % packet Take 1 packet by mouth daily.   Yes [provider]  rivastigmine (EXELON) 4.5 MG capsule Take 1 capsule (4.5 mg total) by mouth 2 (two) times daily. 08/17/19  Yes Star Age, MD  tamsulosin (FLOMAX) 0.4 MG CAPS capsule Take 0.4 mg at bedtime by mouth.   Yes [provider]  vitamin C (ASCORBIC ACID) 500 MG tablet Take 500 mg by mouth daily.    Yes [provider]  ammonium lactate (LAC-HYDRIN) 12 % cream Apply topically as needed for dry skin. As prescribed Patient not taking: Reported on 08/30/2019 11/05/16   Gardiner Barefoot, DPM    Physical Exam: Vitals:   09/08/19 1338 09/08/19 1400 09/08/19 1415 09/08/19 1445  BP: 133/83 (!) 130/92 109/73 (!) 154/80  Pulse: 60 (!) 56 (!) 41 68  Resp: 11 13 15 14   Temp: 97.7 F (36.5 C)     TempSrc:      SpO2: 99% 100% 99% 100%  Weight:      Height:       Constitutional: Resting supine in bed, NAD, calm, comfortable Eyes: PERRL, EOMI, lids and conjunctivae normal ENMT: Mucous membranes are moist. Posterior pharynx clear of any exudate or lesions.Normal dentition.  Neck: normal, supple, no masses. Respiratory: clear to auscultation bilaterally, no wheezing, no crackles. Normal respiratory effort. No accessory muscle use.  Cardiovascular: Irregularly irregular, no murmurs / rubs / gallops. No extremity edema. 2+ pedal pulses. Abdomen: no tenderness, no masses palpated. No hepatosplenomegaly. Bowel sounds positive.  Musculoskeletal: no clubbing / cyanosis. No joint deformity upper and lower extremities. Good ROM, no contractures.  Some cogwheel rigidity left upper extremity..  Skin: no rashes, lesions, ulcers. No induration Neurologic: CN 2-12 grossly intact. Sensation intact, Strength 5/5 in all 4.  Slight tremor bilateral upper extremities. Psychiatric: Normal judgment and insight. Alert and oriented x 3. Normal mood.   Labs on Admission: I have personally reviewed following labs and imaging studies  CBC: Recent Labs  Lab 09/08/19 1348  WBC 3.9*  NEUTROABS 2.6  HGB 13.4  HCT 41.1  MCV 98.3  PLT 627   Basic Metabolic Panel: Recent Labs  Lab 09/08/19 1348 09/08/19 1536  NA 139  --   K 4.4  --   CL 101  --   CO2 28  --   GLUCOSE 40*  --   BUN 16  --   CREATININE 1.25*  --   CALCIUM 9.6  --   MG  --  1.8   GFR: Estimated Creatinine Clearance: 61.5 mL/min (A) (by C-G formula  based on SCr of 1.25 mg/dL (H)). Liver Function Tests: Recent Labs  Lab 09/08/19 1348  AST  24  ALT 8  ALKPHOS 45  BILITOT 1.2  PROT 6.8  ALBUMIN 3.9   No results for input(s): LIPASE, AMYLASE in the last 168 hours. No results for input(s): AMMONIA in the last 168 hours. Coagulation Profile: No results for input(s): INR, PROTIME in the last 168 hours. Cardiac Enzymes: No results for input(s): CKTOTAL, CKMB, CKMBINDEX, TROPONINI in the last 168 hours. BNP (last 3 results) No results for input(s): PROBNP in the last 8760 hours. HbA1C: No results for input(s): HGBA1C in the last 72 hours. CBG: Recent Labs  Lab 09/08/19 1511 09/08/19 1707  GLUCAP 65* 126*   Lipid Profile: No results for input(s): CHOL, HDL, LDLCALC, TRIG, CHOLHDL, LDLDIRECT in the last 72 hours. Thyroid Function Tests: Recent Labs    09/08/19 1536  TSH 1.054   Anemia Panel: No results for input(s): VITAMINB12, FOLATE, FERRITIN, TIBC, IRON, RETICCTPCT in the last 72 hours. Urine analysis:    Component Value Date/Time   COLORURINE STRAW (A) 08/30/2019 1933   APPEARANCEUR CLEAR 08/30/2019 1933   LABSPEC 1.004 (L) 08/30/2019 1933   PHURINE 5.0 08/30/2019 1933   GLUCOSEU NEGATIVE 08/30/2019 1933   HGBUR NEGATIVE 08/30/2019 [place] NEGATIVE 08/30/2019 1933   KETONESUR NEGATIVE 08/30/2019 1933   PROTEINUR NEGATIVE 08/30/2019 1933   UROBILINOGEN 1.0 11/23/2014 1455   NITRITE NEGATIVE 08/30/2019 1933   LEUKOCYTESUR NEGATIVE 08/30/2019 1933    Radiological Exams on Admission: CT Head Wo Contrast  Result Date: 09/08/2019 CLINICAL DATA:  Altered mental status EXAM: CT HEAD WITHOUT CONTRAST TECHNIQUE: Contiguous axial images were obtained from the base of the skull through the vertex without intravenous contrast. COMPARISON:  CT 08/30/2019 FINDINGS: Brain: No acute territorial infarction, hemorrhage, or intracranial mass. Moderate atrophy. Moderate hypodensity in the white matter consistent with  chronic small vessel ischemic change. Stable ventricle size. Vascular: No hyperdense vessels.  Carotid vascular calcification. Skull: Normal. Negative for fracture or focal lesion. Sinuses/Orbits: No acute finding. Other: None IMPRESSION: 1. No CT evidence for acute intracranial abnormality. 2. Atrophy and chronic small vessel ischemic changes of the white matter. Electronically Signed   By: Donavan Foil M.D.   On: 09/08/2019 17:45   DG Chest Portable 1 View  Result Date: 09/08/2019 CLINICAL DATA:  79 year old male found unresponsive in hypotensive. EXAM: PORTABLE CHEST 1 VIEW COMPARISON:  Chest CT 11/30/2008 and earlier. FINDINGS: Portable AP upright view at 1414 hours. Mild chronic cardiomegaly. Other mediastinal contours are within normal limits. Visualized tracheal air column is within normal limits. Allowing for portable technique the lungs are clear. No pneumothorax or pleural effusion. Negative visible bowel gas pattern and osseous structures. IMPRESSION: Mild chronic cardiomegaly.  No acute cardiopulmonary abnormality. Electronically Signed   By: Genevie Ann M.D.   On: 09/08/2019 14:38    EKG: Independently reviewed. Atrial fibrillation with slow ventricular rate 42 bpm.  Prior EKG showed A. fib with rate 72 bpm, PVC present.  Assessment/Plan Principal Problem:   Unresponsive episode Active Problems:   Hyperlipidemia   Essential hypertension   Parkinsonism (HCC)   Anxiety   Atrial fibrillation (HCC) [I48.91]   Hypoglycemia without diagnosis of diabetes mellitus  Pinkney Venard Hagmann is a 79 y.o. male with medical history significant for permanent atrial fibrillation on Eliquis, parkinsonism, hypertension, hyperlipidemia, anxiety, and BPH who is admitted after an unresponsive episode.  Unresponsive episode with hypoglycemia/hypotension/bradycardia: Now back to baseline.  Suspect medication induced +/-autonomic dysfunction in setting of parkinsonism.  Unclear reason for hypoglycemic episode.  Has  been seen  by cardiology who have low suspicion for cardiac arrhythmia as etiology but will continue to follow while in hospital. -Monitor on telemetry -Update echocardiogram -Orthostatic vitals, fall precautions, PT/OT -Hold home Sinemet, doxazosin, losartan, Toprol-XL, rivastigmine (may need to decrease dose, sounds like he tolerated better at 1.5 mg twice daily), clonazepam, and tamsulosin at this time -On Lasix 40 mg twice daily at home (for previously documented lower extremity edema), can probably discontinue this as he has no evidence of CHF or edema -Check serum insulin, C-peptide, proinsulin, beta hydroxybutyrate -Continue IV fluid hydration with D5-1/2 NS overnight, encourage oral intake  Permanent atrial fibrillation: Remains in atrial fibrillation, initially slow ventricular response in the ED which is now improved.  CHA2DS2-VASc 4 3. -Continue Eliquis -Holding Toprol-XL  Hypertension: Holding home Toprol-XL, losartan, doxazosin, Lasix due to initial hypotension.  Check orthostatic vital signs.  Judicious resumption of antihypertensives.  Parkinsonism: Follows with neurology, Dr. Rexene Alberts.  Holding home regimen of Sinemet and rivastigmine as above.  Hyperlipidemia: Continue pravastatin.  Anxiety: Holding clonazepam due to unresponsive episode with hypotension and bradycardia prior to arrival.  DVT prophylaxis: Eliquis Code Status: DNR, confirmed with patient and wife at bedside Family Communication: Discussed with patient's wife at bedside Disposition Plan: From home and likely discharge to home Consults called: Cardiology Admission status:  Status is: Observation  The patient remains OBS appropriate and will d/c before 2 midnights.  Dispo: The patient is from: Home              Anticipated d/c is to: Home              Anticipated d/c date is: 1 day              Patient currently is not medically stable to d/c.  Zada Finders MD [facility]  If 7PM-7AM,  please contact night-coverage [email or web address]  09/08/2019, 6:23 PM

## 2019-09-08 NOTE — ED Provider Notes (Signed)
Care transferred to me.  Patient's glucose has improved and is more stable, he is currently on a dextrose drip.  CT head shows no acute abnormalities.  Wife indicates he had about a 30-minute episode of being unable to be aroused.  Unclear what exactly caused this.  His vitals are now stable.  Cardiology has seen and will help make medication adjustments as far as BP meds/metoprolol.  He is no longer bradycardic.  Will admit.   Sherwood Gambler, MD 09/08/19 [phone]

## 2019-09-08 NOTE — Telephone Encounter (Signed)
Received call from Don,PT with [facility]. He requested Dr Rexene Alberts be sent a message. The patient has been sent to [facility] ED with BP 70/40, HR 50. He stated when he arrived the patient was "basically unresponsive", family was with him. This is  2nd time in two weeks he's been sent to ED for same issue. Don left his # for any questions.

## 2019-09-08 NOTE — ED Triage Notes (Signed)
Pt from home via ems; called out for unresponsiveness; found to be hypotensive with ems 70/44; initially only responsive to painful stimuli; pt repositioned, more alert; hx parkinson's, afib; pt also found to be bradycardic, some improvement with ems, rate 40-90 afib; alert and oriented to self and place on arrival  CBG 112 114/74 RR 22 98% 2L

## 2019-09-08 NOTE — Telephone Encounter (Signed)
Thankfully, Brian [name] was there to facilitate ED transfer.  I reviewed the ER note from 08/30/19.  Thank you for letting me know. No ER note yet from today.

## 2019-09-09 ENCOUNTER — Observation Stay (HOSPITAL_COMMUNITY): Payer: Medicare Other

## 2019-09-09 DIAGNOSIS — E785 Hyperlipidemia, unspecified: Secondary | ICD-10-CM | POA: Diagnosis not present

## 2019-09-09 DIAGNOSIS — R41841 Cognitive communication deficit: Secondary | ICD-10-CM | POA: Diagnosis not present

## 2019-09-09 DIAGNOSIS — I4891 Unspecified atrial fibrillation: Secondary | ICD-10-CM | POA: Diagnosis not present

## 2019-09-09 DIAGNOSIS — Y92009 Unspecified place in unspecified non-institutional (private) residence as the place of occurrence of the external cause: Secondary | ICD-10-CM | POA: Diagnosis not present

## 2019-09-09 DIAGNOSIS — R2681 Unsteadiness on feet: Secondary | ICD-10-CM | POA: Diagnosis not present

## 2019-09-09 DIAGNOSIS — I340 Nonrheumatic mitral (valve) insufficiency: Secondary | ICD-10-CM | POA: Diagnosis not present

## 2019-09-09 DIAGNOSIS — K219 Gastro-esophageal reflux disease without esophagitis: Secondary | ICD-10-CM | POA: Diagnosis not present

## 2019-09-09 DIAGNOSIS — T50905A Adverse effect of unspecified drugs, medicaments and biological substances, initial encounter: Secondary | ICD-10-CM | POA: Diagnosis present

## 2019-09-09 DIAGNOSIS — Z7901 Long term (current) use of anticoagulants: Secondary | ICD-10-CM | POA: Diagnosis not present

## 2019-09-09 DIAGNOSIS — F419 Anxiety disorder, unspecified: Secondary | ICD-10-CM | POA: Diagnosis not present

## 2019-09-09 DIAGNOSIS — Z833 Family history of diabetes mellitus: Secondary | ICD-10-CM | POA: Diagnosis not present

## 2019-09-09 DIAGNOSIS — R296 Repeated falls: Secondary | ICD-10-CM | POA: Diagnosis present

## 2019-09-09 DIAGNOSIS — R1312 Dysphagia, oropharyngeal phase: Secondary | ICD-10-CM | POA: Diagnosis not present

## 2019-09-09 DIAGNOSIS — N400 Enlarged prostate without lower urinary tract symptoms: Secondary | ICD-10-CM | POA: Diagnosis not present

## 2019-09-09 DIAGNOSIS — R4189 Other symptoms and signs involving cognitive functions and awareness: Secondary | ICD-10-CM | POA: Diagnosis not present

## 2019-09-09 DIAGNOSIS — Z823 Family history of stroke: Secondary | ICD-10-CM | POA: Diagnosis not present

## 2019-09-09 DIAGNOSIS — Z79899 Other long term (current) drug therapy: Secondary | ICD-10-CM | POA: Diagnosis not present

## 2019-09-09 DIAGNOSIS — R404 Transient alteration of awareness: Secondary | ICD-10-CM | POA: Diagnosis present

## 2019-09-09 DIAGNOSIS — R001 Bradycardia, unspecified: Secondary | ICD-10-CM

## 2019-09-09 DIAGNOSIS — R5381 Other malaise: Secondary | ICD-10-CM | POA: Diagnosis present

## 2019-09-09 DIAGNOSIS — R2689 Other abnormalities of gait and mobility: Secondary | ICD-10-CM | POA: Diagnosis not present

## 2019-09-09 DIAGNOSIS — I4821 Permanent atrial fibrillation: Secondary | ICD-10-CM | POA: Diagnosis present

## 2019-09-09 DIAGNOSIS — Z888 Allergy status to other drugs, medicaments and biological substances status: Secondary | ICD-10-CM | POA: Diagnosis not present

## 2019-09-09 DIAGNOSIS — I10 Essential (primary) hypertension: Secondary | ICD-10-CM | POA: Diagnosis not present

## 2019-09-09 DIAGNOSIS — I351 Nonrheumatic aortic (valve) insufficiency: Secondary | ICD-10-CM

## 2019-09-09 DIAGNOSIS — I361 Nonrheumatic tricuspid (valve) insufficiency: Secondary | ICD-10-CM | POA: Diagnosis not present

## 2019-09-09 DIAGNOSIS — I082 Rheumatic disorders of both aortic and tricuspid valves: Secondary | ICD-10-CM | POA: Diagnosis present

## 2019-09-09 DIAGNOSIS — E162 Hypoglycemia, unspecified: Secondary | ICD-10-CM

## 2019-09-09 DIAGNOSIS — F0280 Dementia in other diseases classified elsewhere without behavioral disturbance: Secondary | ICD-10-CM | POA: Diagnosis present

## 2019-09-09 DIAGNOSIS — Z800 Family history of malignant neoplasm of digestive organs: Secondary | ICD-10-CM | POA: Diagnosis not present

## 2019-09-09 DIAGNOSIS — G20 Parkinson's disease: Secondary | ICD-10-CM

## 2019-09-09 DIAGNOSIS — I959 Hypotension, unspecified: Secondary | ICD-10-CM | POA: Diagnosis present

## 2019-09-09 DIAGNOSIS — F339 Major depressive disorder, recurrent, unspecified: Secondary | ICD-10-CM | POA: Diagnosis not present

## 2019-09-09 DIAGNOSIS — Z66 Do not resuscitate: Secondary | ICD-10-CM | POA: Diagnosis present

## 2019-09-09 DIAGNOSIS — Z20822 Contact with and (suspected) exposure to covid-19: Secondary | ICD-10-CM | POA: Diagnosis present

## 2019-09-09 DIAGNOSIS — Z993 Dependence on wheelchair: Secondary | ICD-10-CM | POA: Diagnosis not present

## 2019-09-09 LAB — BASIC METABOLIC PANEL
Anion gap: 11 (ref 5–15)
BUN: 15 mg/dL (ref 8–23)
CO2: 24 mmol/L (ref 22–32)
Calcium: 9.1 mg/dL (ref 8.9–10.3)
Chloride: 104 mmol/L (ref 98–111)
Creatinine, Ser: 1.04 mg/dL (ref 0.61–1.24)
GFR calc Af Amer: >60 mL/min (ref >60)
GFR calc non Af Amer: >60 mL/min (ref >60)
Glucose, Bld: 97 mg/dL (ref 70–99)
Potassium: 3.7 mmol/L (ref 3.5–5.1)
Sodium: 139 mmol/L (ref 135–145)

## 2019-09-09 LAB — HEMOGLOBIN A1C
Hgb A1c MFr Bld: 5.6 % (ref 4.8–5.6)
Mean Plasma Glucose: 114 mg/dL

## 2019-09-09 LAB — CORTISOL-AM, BLOOD: Cortisol - AM: 17.3 ug/dL (ref 6.7–22.6)

## 2019-09-09 LAB — CBC
HCT: 40.4 % (ref 39.0–52.0)
Hemoglobin: 13.3 g/dL (ref 13.0–17.0)
MCH: 32.1 pg (ref 26.0–34.0)
MCHC: 32.9 g/dL (ref 30.0–36.0)
MCV: 97.6 fL (ref 80.0–100.0)
Platelets: 210 10*3/uL (ref 150–400)
RBC: 4.14 MIL/uL — ABNORMAL LOW (ref 4.22–5.81)
RDW: 13.1 % (ref 11.5–15.5)
WBC: 4.2 10*3/uL (ref 4.0–10.5)
nRBC: 0 % (ref 0.0–0.2)

## 2019-09-09 LAB — CBG MONITORING, ED
Glucose-Capillary: 85 mg/dL (ref 70–99)
Glucose-Capillary: 88 mg/dL (ref 70–99)

## 2019-09-09 LAB — ECHOCARDIOGRAM COMPLETE
Height: 73 in
Weight: 3118.19 oz

## 2019-09-09 LAB — GLUCOSE, CAPILLARY
Glucose-Capillary: 85 mg/dL (ref 70–99)
Glucose-Capillary: 92 mg/dL (ref 70–99)
Glucose-Capillary: 105 mg/dL — ABNORMAL HIGH (ref 70–99)

## 2019-09-09 LAB — BETA-HYDROXYBUTYRIC ACID: Beta-Hydroxybutyric Acid: 0.11 mmol/L (ref 0.05–0.27)

## 2019-09-09 MED ORDER — CARBIDOPA-LEVODOPA 25-100 MG PO TABS
2.0000 | ORAL_TABLET | Freq: Every day | ORAL | Status: DC
  Administered 2019-09-09 – 2019-09-13 (×23): 2 via ORAL
  Filled 2019-09-09 (×22): qty 2

## 2019-09-09 MED ORDER — ESCITALOPRAM OXALATE 10 MG PO TABS
10.0000 mg | ORAL_TABLET | Freq: Every day | ORAL | Status: DC
  Administered 2019-09-09 – 2019-09-12 (×4): 10 mg via ORAL
  Filled 2019-09-09 (×4): qty 1

## 2019-09-09 MED ORDER — CLONAZEPAM 0.5 MG PO TABS
0.5000 mg | ORAL_TABLET | Freq: Every day | ORAL | Status: DC
  Administered 2019-09-09 – 2019-09-12 (×5): 0.5 mg via ORAL
  Filled 2019-09-09 (×5): qty 1

## 2019-09-09 MED ORDER — CARBIDOPA-LEVODOPA ER 50-200 MG PO TBCR
2.0000 | EXTENDED_RELEASE_TABLET | Freq: Every day | ORAL | Status: DC
  Administered 2019-09-09 – 2019-09-12 (×4): 2 via ORAL
  Filled 2019-09-09 (×5): qty 2

## 2019-09-09 MED ORDER — TAMSULOSIN HCL 0.4 MG PO CAPS
0.4000 mg | ORAL_CAPSULE | Freq: Every day | ORAL | Status: DC
  Administered 2019-09-09 – 2019-09-12 (×4): 0.4 mg via ORAL
  Filled 2019-09-09 (×4): qty 1

## 2019-09-09 MED ORDER — RIVASTIGMINE TARTRATE 1.5 MG PO CAPS
4.5000 mg | ORAL_CAPSULE | Freq: Two times a day (BID) | ORAL | Status: DC
  Administered 2019-09-09 – 2019-09-13 (×8): 4.5 mg via ORAL
  Filled 2019-09-09 (×9): qty 3

## 2019-09-09 MED ORDER — POTASSIUM CHLORIDE CRYS ER 10 MEQ PO TBCR
20.0000 meq | EXTENDED_RELEASE_TABLET | Freq: Every day | ORAL | Status: DC
  Administered 2019-09-09 – 2019-09-13 (×5): 20 meq via ORAL
  Filled 2019-09-09 (×7): qty 2

## 2019-09-09 MED ORDER — LOSARTAN POTASSIUM 25 MG PO TABS
25.0000 mg | ORAL_TABLET | Freq: Every day | ORAL | Status: DC
  Administered 2019-09-09 – 2019-09-13 (×5): 25 mg via ORAL
  Filled 2019-09-09 (×5): qty 1

## 2019-09-09 NOTE — ED Notes (Signed)
CBG results of 88 reported to [place], [place].

## 2019-09-09 NOTE — Evaluation (Signed)
Physical Therapy Evaluation Patient Details Name: Brian [name] MRN: [id number] DOB: 06/08/1940 Today's Date: 09/09/2019   History of Present Illness  Pt is a 79 y.o. male with PMHx including Afib, parkinsonism, hypertension, hyperlipidemia, anxiety, BPH, hx of bil TKA, who presents to the ED for an unresponsive episode. CT negative for acute changes. Of note pt with previous ED visit on 6/8 for hypotensive episode without loss of consciousness and was d/c home.   Clinical Impression  Pt admitted secondary to problem above with deficits below. Pt requiring mod to max A for bed mobility this session. Unable to attempt further mobility safely with +1 assist. Pt with very rigid posture and requiring min to mod A to maintain sitting balance. No family present during session and PLOF obtained from OT. Feel pt would benefit from SNF level therapies, however, if family able to provide necessary assist, would likely be able to dc home with HHPT. Will continue to follow acutely to maximize functional mobility independence and safety.     Follow Up Recommendations SNF;Supervision/Assistance - 24 hour (unless family able to provide necessary support )    Equipment Recommendations  None recommended by PT    Recommendations for Other Services       Precautions / Restrictions Precautions Precautions: Fall Restrictions Weight Bearing Restrictions: No      Mobility  Bed Mobility Overal bed mobility: Needs Assistance Bed Mobility: Supine to Sit;Rolling;Sit to Sidelying Rolling: Mod assist   Supine to sit: Max assist   Sit to sidelying: Mod assist General bed mobility comments: assist for LEs over EOB and trunk elevation via HHA, pt able to transition himself down towards R elbow with assist for LEs to return to sidelying. Mod A for rolling to remove bed pan. Posterior lean in sitting. Required min to mod A for support.   Transfers                 General transfer comment: Unsafe to  attempt with +1  Ambulation/Gait                Stairs            Wheelchair Mobility    Modified Rankin (Stroke Patients Only)       Balance Overall balance assessment: Needs assistance;History of Falls Sitting-balance support: Feet supported Sitting balance-Leahy Scale: Poor Sitting balance - Comments: reliant on min to mod A to maintain sitting balance.                                      Pertinent Vitals/Pain Pain Assessment: Faces Faces Pain Scale: No hurt    Home Living Family/patient expects to be discharged to:: Private residence Living Arrangements: Spouse/significant other Available Help at Discharge: Family Type of Home: House Home Access: Level entry     Home Layout: Two level;Able to live on main level with bedroom/bathroom (pt stays on main level) Home Equipment: Walker - 2 wheels;Wheelchair - manual;Grab bars - toilet;Hospital bed;Hand held shower head;Shower seat - built in Additional Comments: PLOF/home setup obtained from daughter who was present start of session    Prior Function Level of Independence: Needs assistance   Gait / Transfers Assistance Needed: Reports ambulating short distances with RW. Otherwise uses WC.  Daughter reports pt has been refusing to ambulate recently  ADL's / Homemaking Assistance Needed: Reports needing assist with ADL tasks. spouse has hired a Neurosurgeon who assists  with ADL needs  Comments: pt had recently started working with HHOT/PT approx 1 week ago     Hand Dominance   Dominant Hand: Right    Extremity/Trunk Assessment   Upper Extremity Assessment Upper Extremity Assessment: Defer to OT evaluation    Lower Extremity Assessment Lower Extremity Assessment: Generalized weakness (limited ROM, rigid movements in BLE)    Cervical / Trunk Assessment Cervical / Trunk Assessment: Other exceptions Cervical / Trunk Exceptions: notable stiffness in trunk with mobility tasks   Communication   Communication: Receptive difficulties;Expressive difficulties (delayed responses)  Cognition Arousal/Alertness: Lethargic;Awake/alert Behavior During Therapy: Flat affect Overall Cognitive Status: Impaired/Different from baseline Area of Impairment: Memory;Following commands;Safety/judgement;Awareness;Problem solving                     Memory: Decreased short-term memory Following Commands: Follows one step commands with increased time Safety/Judgement: Decreased awareness of deficits Awareness: Intellectual Problem Solving: Slow processing;Decreased initiation;Difficulty sequencing;Requires verbal cues;Requires tactile cues General Comments: delayed responses to questions/commands,       General Comments      Exercises     Assessment/Plan    PT Assessment Patient needs continued PT services  PT Problem List Decreased strength;Decreased range of motion;Decreased balance;Decreased activity tolerance;Decreased mobility;Decreased knowledge of precautions;Decreased coordination;Decreased cognition;Decreased safety awareness;Decreased knowledge of use of DME       PT Treatment Interventions Gait training;DME instruction;Functional mobility training;Therapeutic activities;Balance training;Therapeutic exercise;Patient/family education;Cognitive remediation    PT Goals (Current goals can be found in the Care Plan section)  Acute Rehab PT Goals Patient Stated Goal: none stated, agreeable to participate in therapies PT Goal Formulation: With patient Time For Goal Achievement: 09/23/19 Potential to Achieve Goals: Good    Frequency Min 3X/week   Barriers to discharge        Co-evaluation               AM-PAC PT "6 Clicks" Mobility  Outcome Measure Help needed turning from your back to your side while in a flat bed without using bedrails?: A Lot Help needed moving from lying on your back to sitting on the side of a flat bed without using bedrails?:  A Lot Help needed moving to and from a bed to a chair (including a wheelchair)?: Total Help needed standing up from a chair using your arms (e.g., wheelchair or bedside chair)?: Total Help needed to walk in hospital room?: Total Help needed climbing 3-5 steps with a railing? : Total 6 Click Score: 8    End of Session   Activity Tolerance: Patient tolerated treatment well Patient left: in bed;with call bell/phone within reach;with bed alarm set Nurse Communication: Mobility status PT Visit Diagnosis: Unsteadiness on feet (R26.81);Muscle weakness (generalized) (M62.81);Difficulty in walking, not elsewhere classified (R26.2)    Time: 5329-9242 PT Time Calculation (min) (ACUTE ONLY): 12 min   Charges:   PT Evaluation $PT Eval Moderate Complexity: 1 Mod          Reuel Derby, PT, DPT  Acute Rehabilitation Services  Pager: [phone] Office: [phone]   Rudean Hitt 09/09/2019, 6:06 PM

## 2019-09-09 NOTE — Progress Notes (Signed)
Progress Note  Patient Name: Brian [name] Date of Encounter: 09/09/2019  [facility] Cardiologist: Fransico Him, MD   Subjective   Pressures are up this AM with improved rates. Patient resting comfortably and denies CP, lightheadedness or dizziness.   Inpatient Medications    Scheduled Meds: . apixaban  5 mg Oral BID  . cholecalciferol  1,000 Units Oral Daily  . clonazePAM  0.5 mg Oral QHS  . famotidine  40 mg Oral Daily  . pravastatin  40 mg Oral Daily  . sodium chloride flush  3 mL Intravenous Q12H  . sodium chloride flush  3 mL Intravenous Q12H   Continuous Infusions: . sodium chloride     PRN Meds: sodium chloride, acetaminophen **OR** acetaminophen, sodium chloride flush   Vital Signs    Vitals:   09/09/19 0822 09/09/19 0823 09/09/19 0830 09/09/19 0900  BP:   (!) 165/105 (!) 148/89  Pulse: 74 86 93 (!) 107  Resp: 14 12 (!) 26 18  Temp:    98.7 F (37.1 C)  TempSrc:      SpO2: 100% 100% 100% 100%  Weight:    88.4 kg  Height:       No intake or output data in the 24 hours ending 09/09/19 0911 Last 3 Weights 09/09/2019 09/08/2019 06/15/2019  Weight (lbs) 194 lb 14.2 oz 219 lb 12.8 oz (No Data)  Weight (kg) 88.4 kg 99.7 kg (No Data)      Telemetry    Afib HR 70-80s, PVCs - Personally Reviewed  ECG    No new - Personally Reviewed  Physical Exam   GEN: No acute distress.   Neck: No JVD Cardiac: RRR, no murmurs, rubs, or gallops.  Respiratory: Clear to auscultation bilaterally. GI: Soft, nontender, non-distended  MS: No edema; No deformity. Neuro:  Nonfocal  Psych: Normal affect   Labs    High Sensitivity Troponin:   Recent Labs  Lab 09/08/19 1348 09/08/19 1536  TROPONINIHS 10 8      Chemistry Recent Labs  Lab 09/08/19 1348 09/09/19 0555  NA 139 139  K 4.4 3.7  CL 101 104  CO2 28 24  GLUCOSE 40* 97  BUN 16 15  CREATININE 1.25* 1.04  CALCIUM 9.6 9.1  PROT 6.8  --   ALBUMIN 3.9  --   AST 24  --   ALT 8  --   ALKPHOS 45  --    BILITOT 1.2  --   GFRNONAA 55* >60  GFRAA >60 >60  ANIONGAP 10 11     Hematology Recent Labs  Lab 09/08/19 1348 09/09/19 0555  WBC 3.9* 4.2  RBC 4.18* 4.14*  HGB 13.4 13.3  HCT 41.1 40.4  MCV 98.3 97.6  MCH 32.1 32.1  MCHC 32.6 32.9  RDW 13.2 13.1  PLT 223 210    BNPNo results for input(s): BNP, PROBNP in the last 168 hours.   DDimer No results for input(s): DDIMER in the last 168 hours.   Radiology    CT Head Wo Contrast  Result Date: 09/08/2019 CLINICAL DATA:  Altered mental status EXAM: CT HEAD WITHOUT CONTRAST TECHNIQUE: Contiguous axial images were obtained from the base of the skull through the vertex without intravenous contrast. COMPARISON:  CT 08/30/2019 FINDINGS: Brain: No acute territorial infarction, hemorrhage, or intracranial mass. Moderate atrophy. Moderate hypodensity in the white matter consistent with chronic small vessel ischemic change. Stable ventricle size. Vascular: No hyperdense vessels.  Carotid vascular calcification. Skull: Normal. Negative for fracture or  focal lesion. Sinuses/Orbits: No acute finding. Other: None IMPRESSION: 1. No CT evidence for acute intracranial abnormality. 2. Atrophy and chronic small vessel ischemic changes of the white matter. Electronically Signed   By: Donavan Foil M.D.   On: 09/08/2019 17:45   DG Chest Portable 1 View  Result Date: 09/08/2019 CLINICAL DATA:  79 year old male found unresponsive in hypotensive. EXAM: PORTABLE CHEST 1 VIEW COMPARISON:  Chest CT 11/30/2008 and earlier. FINDINGS: Portable AP upright view at 1414 hours. Mild chronic cardiomegaly. Other mediastinal contours are within normal limits. Visualized tracheal air column is within normal limits. Allowing for portable technique the lungs are clear. No pneumothorax or pleural effusion. Negative visible bowel gas pattern and osseous structures. IMPRESSION: Mild chronic cardiomegaly.  No acute cardiopulmonary abnormality. Electronically Signed   By: Genevie Ann  M.D.   On: 09/08/2019 14:38    Cardiac Studies   Echo ordered  Echo 2017 Study Conclusions   - Left ventricle: The cavity size was normal. Wall thickness was  increased in a pattern of mild LVH. Systolic function was  vigorous. The estimated ejection fraction was in the range of 65%  to 70%. Wall motion was normal; there were no regional wall  motion abnormalities.  - Left atrium: The atrium was severely dilated.  - Right atrium: The atrium was mildly dilated.  - Pulmonary arteries: Systolic pressure was mildly increased.   Impressions:   - Normal LV systolic function; mild LVH; severe LAE; mild RAE; mild  TR with mildly elevaeted pulmonary pressure.   Patient Profile     79 y.o. male with a hx of permanent atrial fibrillation on Eliquis for anticoagulation, hypertension, Parkinson disease, chronic lower extremity edema and hyperlipidemia who is being seen today for the evaluation of bradycardia and hypertension.  Assessment & Plan    Unresponsiveness/AMS - likely multifactorial from hypoglycemia, bradycardia, hypotension in the setting of Parkinsonism - Improved after IVF and dextrose - antihypertensives held  - Hs troponin negative - On Eliquis for a/c. CT head negative for acute process - orthostatics appear negative - Echo ordered. Echo 2017 showed preserved LVEF, mild LVH, severe LAE, mild RAE, mild TR with mildly elevated pulmonary pressures - further work-up per primary team  Hypotension and bradycardia with H/o of hypertension - Losartan, metoprolol and doxazosin held for hypotension/bradcyardia - pressures up this morning with rates in the 70-80s - continue home lasix - Might need addition of meds with elevated pressures. Will discuss with MD  Persistent afib - Eliquis - BB held as above  Chronic diastolic CHF - lasix as above - euvolemic on exam  For questions or updates, please contact [facility] Please consult [email or web address] for contact  info under        Signed, Amaryah Mallen Ninfa Meeker, PA-C  09/09/2019, 9:11 AM

## 2019-09-09 NOTE — ED Notes (Signed)
Tele  Breakfast Ordered 

## 2019-09-09 NOTE — Progress Notes (Signed)
  Echocardiogram 2D Echocardiogram has been performed.  Brian [name] 09/09/2019, 2:11 PM

## 2019-09-09 NOTE — Evaluation (Signed)
Occupational Therapy Evaluation Patient Details Name: Brian [name] MRN: [id number] DOB: 1941/03/01 Today's Date: 09/09/2019    History of Present Illness Pt is a 79 y.o. male with PMHx including Afib, parkinsonism, hypertension, hyperlipidemia, anxiety, BPH, hx of bil TKA, who presents to the ED for an unresponsive episode. CT negative for acute changes. Of note pt with previous ED visit on 6/8 for hypotensive episode without loss of consciousness and was d/c home.    Clinical Impression   This 79 y/o male presents with the above. PTA pt living at home with spouse, receiving assist from an aide for daily ADL tasks, mostly using w/c for household mobility (daughter present start of session and reports pt has been refusing to ambulate recently, has only been doing so minimally). Pt lethargic initially but is agreeable to OT session. Pt currently requiring overall maxA for bed mobility, maxA (up to maxA+2) for functional transfers using RW, totalA for LB and toileting ADL. Pt following majority of simple commands given multimodal cues and repetition. Presenting with notable weakness, decreased sitting/standing balance and overall mobility status impacting his functional performance. He will benefit from continued acute OT services, currently feel pt will benefit from SNF level therapies at time of discharge (unless family choosing home/able to provide necessary assist at current level) to maximize pt's overall safety and independence with ADL and mobility, and to decrease level of caregiver burden. Will follow.   BP in supine: 148/93 Seated EOB 137/78 After x2 sit<>stand from EOB 149/117 Return to supine end of session 180/110 (RN made aware)     Follow Up Recommendations  SNF;Supervision/Assistance - 24 hour (unless family able to provide necessary assist)    Equipment Recommendations  3 in 1 bedside commode           Precautions / Restrictions Precautions Precautions:  Fall Restrictions Weight Bearing Restrictions: No      Mobility Bed Mobility Overal bed mobility: Needs Assistance Bed Mobility: Supine to Sit;Rolling;Sit to Sidelying Rolling: Mod assist   Supine to sit: Max assist   Sit to sidelying: Mod assist General bed mobility comments: assist for LEs over EOB and trunk elevation via HHA, pt able to transition himself down towards R elbow with assist for LEs to return to sidelying  Transfers Overall transfer level: Needs assistance Equipment used: Rolling walker (2 wheeled) Transfers: Sit to/from Stand Sit to Stand: Max assist;+2 physical assistance;+2 safety/equipment         General transfer comment: heavy boosting assist from EOB with max multimodal cues for hand placement, use of rocking/momentum. pt with difficulty translating weight over feet to achieve full upright, very stiff trunk and difficulty flexing at trunk to return to sitting     Balance Overall balance assessment: Needs assistance;History of Falls Sitting-balance support: Feet supported Sitting balance-Leahy Scale: Fair Sitting balance - Comments: able to maintain static balance with close minguard assist once stabilized , tends to utilize at least single UE support   Standing balance support: Bilateral upper extremity supported Standing balance-Leahy Scale: Zero                             ADL either performed or assessed with clinical judgement   ADL Overall ADL's : Needs assistance/impaired Eating/Feeding: Minimal assistance;Sitting   Grooming: Moderate assistance;Sitting   Upper Body Bathing: Moderate assistance;Sitting   Lower Body Bathing: Total assistance;+2 for physical assistance;Sitting/lateral leans;Sit to/from stand   Upper Body Dressing : Moderate assistance;Maximal assistance;Sitting  Lower Body Dressing: Total assistance;Bed level Lower Body Dressing Details (indicate cue type and reason): totalA to doff/don socks       Toileting- Clothing Manipulation and Hygiene: Total assistance;Bed level Toileting - Clothing Manipulation Details (indicate cue type and reason): pt placed on bed pan end of session to attempt to have BM     Functional mobility during ADLs: Maximal assistance;+2 for physical assistance;+2 for safety/equipment;Rolling walker General ADL Comments: pt with weakness, decreased sitting/standing balance, impaired cognition                          Pertinent Vitals/Pain Pain Assessment: Faces Faces Pain Scale: No hurt Pain Intervention(s): Monitored during session     Hand Dominance Right   Extremity/Trunk Assessment Upper Extremity Assessment Upper Extremity Assessment: Generalized weakness (RUE grossly weaker than LUE)   Lower Extremity Assessment Lower Extremity Assessment: Defer to PT evaluation   Cervical / Trunk Assessment Cervical / Trunk Assessment: Other exceptions Cervical / Trunk Exceptions: notable stiffness in trunk with mobility tasks   Communication Communication Communication: Receptive difficulties;Expressive difficulties (delayed responses)   Cognition Arousal/Alertness: Lethargic;Awake/alert Behavior During Therapy: Flat affect Overall Cognitive Status: Impaired/Different from baseline Area of Impairment: Memory;Following commands;Safety/judgement;Awareness;Problem solving                     Memory: Decreased short-term memory Following Commands: Follows one step commands with increased time Safety/Judgement: Decreased awareness of deficits Awareness: Intellectual Problem Solving: Slow processing;Decreased initiation;Difficulty sequencing;Requires verbal cues;Requires tactile cues General Comments: delayed responses to questions/commands, pt initially stating he has 5 children but daughter correcting and stating he has 4. Oriented to Name/DOB    General Comments  daughter present and supportive start of session     Exercises     Shoulder  Instructions      Home Living Family/patient expects to be discharged to:: Private residence Living Arrangements: Spouse/significant other Available Help at Discharge: Family Type of Home: House Home Access: Level entry     Home Layout: Two level;Able to live on main level with bedroom/bathroom (pt stays on main level)     Bathroom Shower/Tub: Walk-in shower         Home Equipment: Environmental consultant - 2 wheels;Wheelchair - manual;Grab bars - toilet;Hospital bed;Hand held shower head;Shower seat - built in   Additional Comments: PLOF/home setup obtained from daughter who was present start of session      Prior Functioning/Environment Level of Independence: Needs assistance  Gait / Transfers Assistance Needed: Reports ambulating short distances with RW. Otherwise uses WC.  Daughter reports pt has been refusing to ambulate recently ADL's / Homemaking Assistance Needed: Reports needing assist with ADL tasks. spouse has hired a caregiver/aide who assists with ADL needs   Comments: pt had recently started working with [facility]OT/PT approx 1 week ago        OT Problem List: Decreased strength;Decreased range of motion;Decreased activity tolerance;Impaired balance (sitting and/or standing);Decreased cognition;Decreased safety awareness;Decreased knowledge of use of DME or AE;Cardiopulmonary status limiting activity      OT Treatment/Interventions: Self-care/ADL training;Therapeutic exercise;Energy conservation;DME and/or AE instruction;Therapeutic activities;Patient/family education;Balance training;Cognitive remediation/compensation    OT Goals(Current goals can be found in the care plan section) Acute Rehab OT Goals Patient Stated Goal: none stated, agreeable to participate in therapies OT Goal Formulation: With patient Time For Goal Achievement: 09/23/19 Potential to Achieve Goals: Good  OT Frequency: Min 2X/week   Barriers to D/C:  Co-evaluation              AM-PAC OT  "6 Clicks" Daily Activity     Outcome Measure Help from another person eating meals?: A Lot Help from another person taking care of personal grooming?: A Lot Help from another person toileting, which includes using toliet, bedpan, or urinal?: Total Help from another person bathing (including washing, rinsing, drying)?: A Lot Help from another person to put on and taking off regular upper body clothing?: A Lot Help from another person to put on and taking off regular lower body clothing?: Total 6 Click Score: 10   End of Session Equipment Utilized During Treatment: Gait belt;Rolling walker Nurse Communication: Mobility status;Need for lift equipment  Activity Tolerance: Patient tolerated treatment well Patient left: in bed;with call bell/phone within reach;with bed alarm set;Other (comment) (on bedpan, RN made aware)  OT Visit Diagnosis: Other abnormalities of gait and mobility (R26.89);Muscle weakness (generalized) (M62.81);Other symptoms and signs involving cognitive function                Time: 6283-1517 OT Time Calculation (min): 36 min Charges:  OT General Charges $OT Visit: 1 Visit OT Evaluation $OT Eval Moderate Complexity: 1 Mod OT Treatments $Self Care/Home Management : 8-22 mins  Lou Cal, OT Acute Rehabilitation Services Pager [phone] Office [phone]  Raymondo Band 09/09/2019, 12:36 PM

## 2019-09-09 NOTE — Progress Notes (Signed)
Progress Note    Brian [name]  GYJ:[id number] DOB: 1940/11/06  DOA: 09/08/2019 PCP: Wenda Low, MD      Brief Narrative:    Medical records reviewed and are as summarized below:  Brian [name] is a 79 y.o. male with medical history significant for permanent atrial fibrillation on Eliquis, parkinsonism, hypertension, hyperlipidemia, anxiety, and BPH who presents to the ED for evaluation after an unresponsive episode.  Patient was recently seen in the ED on 08/30/2019 for a hypotensive episode without loss of consciousness.  He was brought to the ED for evaluation at which time lab work was reassuring, urinalysis negative for UTI, and CT head without contrast showed generalized cerebral atrophy and small amount of soft tissue air adjacent to the superior and anterolateral aspects of the right sphenoid bone.  He was discharged home.  Earlier on 09/08/2019 patient apparently ate and tolerated lunch around 11 AM.  He was sitting down in the den in his wheelchair when his Occupational Therapy came to the house and found him unresponsive and cold.  He was noted to have a low blood pressure of 74/48 and pulse of 62.  They were unable to wake him or achieve any response.  EMS were called and per ED documentation blood pressure was 70/40 with heart rate of 50.  He was brought to the ED for further evaluation and initially only responsive to painful stimuli on arrival but now has returned to his baseline.  Patient was given IV fluids for hypotension and IV dextrose for hypoglycemia.  Assessment/Plan:   Principal Problem:   Unresponsive episode Active Problems:   Hyperlipidemia   Essential hypertension   Parkinsonism (HCC)   Anxiety   Atrial fibrillation (HCC) [I48.91]   Hypoglycemia without diagnosis of diabetes mellitus   Unresponsive episode with hypoglycemia/hypotension/bradycardia: Patient is now alert.  BP is elevated and heart rate and glucose levels have normalized. Suspect  medication induced +/-autonomic dysfunction in setting of parkinsonism.  Unclear reason for hypoglycemic episode.  He was evaluated by cardiology who have low suspicion for cardiac arrhythmia as etiology.  Dr. Harrell Gave recommended stopping doxazosin and metoprolol and restarting losartan at 25 mg (half the home dose).  Monitor BP closely.   -Monitor on telemetry 2D echo is pending. -Orthostatic vitals, fall precautions, PT/OT -On Lasix 40 mg twice daily at home (for previously documented lower extremity edema), can probably discontinue this as he has no evidence of CHF or edema -Check serum insulin, C-peptide, proinsulin, beta hydroxybutyrate Encourage adequate oral intake.  Permanent atrial fibrillation: Remains in atrial fibrillation, initially slow ventricular response in the ED which is now improved.  CHA2DS2-VASc 4 3. -Continue Eliquis -Holding Toprol-XL  Hypertension: Hypotension has resolved and BP is elevated.  Toprol and doxazosin have been discontinued.  Losartan has been decreased to 25 mg daily.  Lasix as needed for swelling.   Monitor BP closely.  Parkinsonism: Follows with neurology, Dr. Rexene Alberts.  Resume Sinemet and rivastigmine and monitor BP  Hyperlipidemia: Continue pravastatin.  Anxiety: Holding clonazepam due to unresponsive episode with hypotension and bradycardia prior to arrival.  Body mass index is 25.71 kg/m.   Family Communication/Anticipated D/C date and plan/Code Status   DVT prophylaxis:  apixaban (ELIQUIS) tablet 5 mg   Code Status: DNR Family Communication: Plan discussed with patient Disposition Plan:    Status is: Observation  The patient will require care spanning > 2 midnights and should be moved to inpatient because: Inpatient level of care appropriate due  to severity of illness.  Monitor BP closely after changes in blood pressure regimen  Dispo: The patient is from: Home              Anticipated d/c is to: Home               Anticipated d/c date is: 1 day              Patient currently is not medically stable to d/c.           Subjective:   No complaints today.  No headache, dizziness, chest pain or shortness of breath.  Objective:    Vitals:   09/09/19 0822 09/09/19 0823 09/09/19 0830 09/09/19 0900  BP:   (!) 165/105 (!) 148/89  Pulse: 74 86 93 (!) 107  Resp: 14 12 (!) 26 18  Temp:    98.7 F (37.1 C)  TempSrc:      SpO2: 100% 100% 100% 100%  Weight:    88.4 kg  Height:       No data found.  No intake or output data in the 24 hours ending 09/09/19 1239 Filed Weights   09/08/19 1332 09/09/19 0900  Weight: 99.7 kg 88.4 kg    Exam:  GEN: NAD SKIN: No rash EYES: EOMI ENT: MMM CV: Irregular rate and rhythm PULM: CTA B ABD: soft, ND, NT, +BS CNS: AAO x 3, non focal EXT: No edema or tenderness   Data Reviewed:   I have personally reviewed following labs and imaging studies:  Labs: Labs show the following:   Basic Metabolic Panel: Recent Labs  Lab 09/08/19 1348 09/08/19 1536 09/09/19 0555  NA 139  --  139  K 4.4  --  3.7  CL 101  --  104  CO2 28  --  24  GLUCOSE 40*  --  97  BUN 16  --  15  CREATININE 1.25*  --  1.04  CALCIUM 9.6  --  9.1  MG  --  1.8  --    GFR Estimated Creatinine Clearance: 67.2 mL/min (by C-G formula based on SCr of 1.04 mg/dL). Liver Function Tests: Recent Labs  Lab 09/08/19 1348  AST 24  ALT 8  ALKPHOS 45  BILITOT 1.2  PROT 6.8  ALBUMIN 3.9   No results for input(s): LIPASE, AMYLASE in the last 168 hours. No results for input(s): AMMONIA in the last 168 hours. Coagulation profile No results for input(s): INR, PROTIME in the last 168 hours.  CBC: Recent Labs  Lab 09/08/19 1348 09/09/19 0555  WBC 3.9* 4.2  NEUTROABS 2.6  --   HGB 13.4 13.3  HCT 41.1 40.4  MCV 98.3 97.6  PLT 223 210   Cardiac Enzymes: No results for input(s): CKTOTAL, CKMB, CKMBINDEX, TROPONINI in the last 168 hours. BNP (last 3 results) No results  for input(s): PROBNP in the last 8760 hours. CBG: Recent Labs  Lab 09/08/19 1511 09/08/19 1707 09/09/19 0009 09/09/19 0618  GLUCAP 65* 126* 88 85   D-Dimer: No results for input(s): DDIMER in the last 72 hours. Hgb A1c: No results for input(s): HGBA1C in the last 72 hours. Lipid Profile: No results for input(s): CHOL, HDL, LDLCALC, TRIG, CHOLHDL, LDLDIRECT in the last 72 hours. Thyroid function studies: Recent Labs    09/08/19 1536  TSH 1.054   Anemia work up: No results for input(s): VITAMINB12, FOLATE, FERRITIN, TIBC, IRON, RETICCTPCT in the last 72 hours. Sepsis Labs: Recent Labs  Lab 09/08/19 1348 09/09/19  0555  WBC 3.9* 4.2    Microbiology Recent Results (from the past 240 hour(s))  Urine culture     Status: Abnormal   Collection Time: 08/30/19  7:33 PM   Specimen: Urine, Random  Result Value Ref Range Status   Specimen Description   Final    URINE, RANDOM Performed at [facility] [address], [place], [place] [address]    Special Requests   Final    NONE Performed at [facility], [place] [address] [place], [place] [address]    Culture MULTIPLE SPECIES PRESENT, SUGGEST RECOLLECTION (A)  Final   Report Status 09/01/2019 FINAL  Final  SARS Coronavirus 2 by RT PCR (hospital order, performed in [facility] hospital lab) Nasopharyngeal Nasopharyngeal Swab     Status: None   Collection Time: 09/08/19  1:52 PM   Specimen: Nasopharyngeal Swab  Result Value Ref Range Status   SARS Coronavirus 2 NEGATIVE NEGATIVE Final    Comment: (NOTE) SARS-CoV-2 target nucleic acids are NOT DETECTED.  The SARS-CoV-2 RNA is generally detectable in upper and lower respiratory specimens during the acute phase of infection. The lowest concentration of SARS-CoV-2 viral copies this assay can detect is 250 copies / mL. A negative result does not preclude SARS-CoV-2 infection and should not be used as the sole basis for treatment or  other patient management decisions.  A negative result may occur with improper specimen collection / handling, submission of specimen other than nasopharyngeal swab, presence of viral mutation(s) within the areas targeted by this assay, and inadequate number of viral copies (<250 copies / mL). A negative result must be combined with clinical observations, patient history, and epidemiological information.  Fact Sheet for Patients:   StrictlyIdeas.no  Fact Sheet for Healthcare Providers: BankingDealers.co.za  This test is not yet approved or  cleared by the [place] FDA and has been authorized for detection and/or diagnosis of SARS-CoV-2 by FDA under an Emergency Use Authorization (EUA).  This EUA will remain in effect (meaning this test can be used) for the duration of the COVID-19 declaration under Section 564(b)(1) of the Act, 21 U.S.C. section 360bbb-3(b)(1), unless the authorization is terminated or revoked sooner.  Performed at [facility], [place] [address] [place], [place] [address]     Procedures and diagnostic studies:  CT Head Wo Contrast  Result Date: 09/08/2019 CLINICAL DATA:  Altered mental status EXAM: CT HEAD WITHOUT CONTRAST TECHNIQUE: Contiguous axial images were obtained from the base of the skull through the vertex without intravenous contrast. COMPARISON:  CT 08/30/2019 FINDINGS: Brain: No acute territorial infarction, hemorrhage, or intracranial mass. Moderate atrophy. Moderate hypodensity in the white matter consistent with chronic small vessel ischemic change. Stable ventricle size. Vascular: No hyperdense vessels.  Carotid vascular calcification. Skull: Normal. Negative for fracture or focal lesion. Sinuses/Orbits: No acute finding. Other: None IMPRESSION: 1. No CT evidence for acute intracranial abnormality. 2. Atrophy and chronic small vessel ischemic changes of the white matter. Electronically Signed    By: Donavan Foil M.D.   On: 09/08/2019 17:45   DG Chest Portable 1 View  Result Date: 09/08/2019 CLINICAL DATA:  79 year old male found unresponsive in hypotensive. EXAM: PORTABLE CHEST 1 VIEW COMPARISON:  Chest CT 11/30/2008 and earlier. FINDINGS: Portable AP upright view at 1414 hours. Mild chronic cardiomegaly. Other mediastinal contours are within normal limits. Visualized tracheal air column is within normal limits. Allowing for portable technique the lungs are clear. No pneumothorax or pleural effusion. Negative visible  bowel gas pattern and osseous structures. IMPRESSION: Mild chronic cardiomegaly.  No acute cardiopulmonary abnormality. Electronically Signed   By: Genevie Ann M.D.   On: 09/08/2019 14:38    Medications:    apixaban  5 mg Oral BID   carbidopa-levodopa  2 tablet Oral QHS   carbidopa-levodopa  2 tablet Oral See admin instructions   cholecalciferol  1,000 Units Oral Daily   clonazePAM  0.5 mg Oral QHS   escitalopram  10 mg Oral QHS   famotidine  40 mg Oral Daily   losartan  25 mg Oral Daily   potassium chloride  20 mEq Oral Daily   pravastatin  40 mg Oral Daily   rivastigmine  4.5 mg Oral BID   sodium chloride flush  3 mL Intravenous Q12H   sodium chloride flush  3 mL Intravenous Q12H   tamsulosin  0.4 mg Oral QHS   Continuous Infusions:  sodium chloride       LOS: 0 days   Indra Wolters  [facility]     09/09/2019, 12:39 PM

## 2019-09-10 DIAGNOSIS — I4821 Permanent atrial fibrillation: Secondary | ICD-10-CM

## 2019-09-10 LAB — GLUCOSE, CAPILLARY
Glucose-Capillary: 87 mg/dL (ref 70–99)
Glucose-Capillary: 121 mg/dL — ABNORMAL HIGH (ref 70–99)
Glucose-Capillary: 109 mg/dL — ABNORMAL HIGH (ref 70–99)
Glucose-Capillary: 104 mg/dL — ABNORMAL HIGH (ref 70–99)

## 2019-09-10 LAB — INSULIN, RANDOM: Insulin: 6.7 u[IU]/mL (ref 2.6–24.9)

## 2019-09-10 LAB — C-PEPTIDE: C-Peptide: 1.9 ng/mL (ref 1.1–4.4)

## 2019-09-10 NOTE — Progress Notes (Signed)
Progress Note    Brian [name]  QMG:[id number] DOB: 1940/10/01  DOA: 09/08/2019 PCP: Wenda Low, MD      Brief Narrative:    Medical records reviewed and are as summarized below:  Brian [name] is a 79 y.o. male with medical history significant for permanent atrial fibrillation on Eliquis, parkinsonism, hypertension, hyperlipidemia, anxiety, and BPH who presents to the ED for evaluation after an unresponsive episode.  Patient was recently seen in the ED on 08/30/2019 for a hypotensive episode without loss of consciousness.  He was brought to the ED for evaluation at which time lab work was reassuring, urinalysis negative for UTI, and CT head without contrast showed generalized cerebral atrophy and small amount of soft tissue air adjacent to the superior and anterolateral aspects of the right sphenoid bone.  He was discharged home.  Earlier on 09/08/2019 patient apparently ate and tolerated lunch around 11 AM.  He was sitting down in the den in his wheelchair when his Occupational Therapy came to the house and found him unresponsive and cold.  He was noted to have a low blood pressure of 74/48 and pulse of 62.  They were unable to wake him or achieve any response.  EMS were called and per ED documentation blood pressure was 70/40 with heart rate of 50.  He was brought to the ED for further evaluation and initially only responsive to painful stimuli on arrival but now has returned to his baseline.  Patient was given IV fluids for hypotension and IV dextrose for hypoglycemia.  Assessment/Plan:   Principal Problem:   Unresponsive episode Active Problems:   Hyperlipidemia   Essential hypertension   Parkinsonism (HCC)   Anxiety   Atrial fibrillation (HCC) [I48.91]   Hypoglycemia without diagnosis of diabetes mellitus   Bradycardia   Unresponsiveness   Unresponsive episode with hypoglycemia/hypotension/bradycardia: Patient is now alert.  BP is elevated and heart rate and glucose  levels have normalized. Suspect medication induced +/-autonomic dysfunction in setting of parkinsonism.  Unclear reason for hypoglycemic episode.  He was evaluated by cardiology who have low suspicion for cardiac arrhythmia as etiology.  Dr. Harrell Gave recommended stopping doxazosin and metoprolol and restarting losartan at 25 mg (half the home dose).  Monitor BP closely.   -Monitor on telemetry 2D echo showed EF estimated at 60 to 32% but LV diastolic function could not be evaluated.  LA severely dilated, RA is moderately dilated, mild MR and mild AR are present. -Orthostatic vitals, fall precautions, PT/OT -On Lasix 40 mg twice daily at home (for previously documented lower extremity edema).  This can probably be discontinued at discharge since he has no evidence of CHF or edema -serum cortisol, insulin and C-peptide within normal limits.  A1c is 5.6. Encourage adequate oral intake.  Permanent atrial fibrillation: Remains in atrial fibrillation, initially slow ventricular response in the ED which is now improved.  CHA2DS2-VASc 4 3. -Continue Eliquis -Holding Toprol-XL  Hypertension: BP is okay.  Toprol and doxazosin have been discontinued.  Losartan has been decreased to 25 mg daily.  Lasix as needed for swelling.   Monitor BP closely.  Parkinsonism: Follows with neurology, Dr. Rexene Alberts.  Resume Sinemet and rivastigmine and monitor BP  Hyperlipidemia: Continue pravastatin.  Anxiety: Continue clonazepam   Body mass index is 25.8 kg/m.   Family Communication/Anticipated D/C date and plan/Code Status   DVT prophylaxis:  apixaban (ELIQUIS) tablet 5 mg   Code Status: DNR Family Communication: Plan discussed with his wife at  the bedside. Disposition Plan:    Status is: Inpatient  Remains inpatient appropriate because:Unsafe d/c plan.  Follow-up with social worker to assist with disposition.   Dispo: The patient is from: Home              Anticipated d/c is to: SNF               Anticipated d/c date is: 2 days              Patient currently is medically stable to d/c.                 Subjective:   No complaints today.  He feels better.  His wife is at the bedside.  She said patient had had recurrent falls at home.  Objective:    Vitals:   09/09/19 2355 09/10/19 0307 09/10/19 0734 09/10/19 1216  BP: 112/78 (!) 103/57    Pulse: 67 63 (!) 55 (!) 51  Resp: 14 14 16 16   Temp: 97.9 F (36.6 C) 98.3 F (36.8 C) 97.7 F (36.5 C) 98.1 F (36.7 C)  TempSrc: Axillary Oral Oral Axillary  SpO2: 98% 99% 100% 100%  Weight:  88.7 kg    Height:       No data found.   Intake/Output Summary (Last 24 hours) at 09/10/2019 1520 Last data filed at 09/10/2019 8295 Gross per 24 hour  Intake 120 ml  Output 300 ml  Net -180 ml   Filed Weights   09/08/19 1332 09/09/19 0900 09/10/19 0307  Weight: 99.7 kg 88.4 kg 88.7 kg    Exam:  GEN: NAD SKIN: No rash EYES: EOMI. mild swelling noted on the right side of his face around the cheek.  There is no facial tenderness. ENT: MMM CV: Irregular rate and rhythm PULM: CTA B ABD: soft, ND, NT, +BS CNS: AAO x 3, non focal EXT: No edema or tenderness   Data Reviewed:   I have personally reviewed following labs and imaging studies:  Labs: Labs show the following:   Basic Metabolic Panel: Recent Labs  Lab 09/08/19 1348 09/08/19 1536 09/09/19 0555  NA 139  --  139  K 4.4  --  3.7  CL 101  --  104  CO2 28  --  24  GLUCOSE 40*  --  97  BUN 16  --  15  CREATININE 1.25*  --  1.04  CALCIUM 9.6  --  9.1  MG  --  1.8  --    GFR Estimated Creatinine Clearance: 67.2 mL/min (by C-G formula based on SCr of 1.04 mg/dL). Liver Function Tests: Recent Labs  Lab 09/08/19 1348  AST 24  ALT 8  ALKPHOS 45  BILITOT 1.2  PROT 6.8  ALBUMIN 3.9   No results for input(s): LIPASE, AMYLASE in the last 168 hours. No results for input(s): AMMONIA in the last 168 hours. Coagulation profile No results for  input(s): INR, PROTIME in the last 168 hours.  CBC: Recent Labs  Lab 09/08/19 1348 09/09/19 0555  WBC 3.9* 4.2  NEUTROABS 2.6  --   HGB 13.4 13.3  HCT 41.1 40.4  MCV 98.3 97.6  PLT 223 210   Cardiac Enzymes: No results for input(s): CKTOTAL, CKMB, CKMBINDEX, TROPONINI in the last 168 hours. BNP (last 3 results) No results for input(s): PROBNP in the last 8760 hours. CBG: Recent Labs  Lab 09/09/19 1306 09/09/19 1717 09/09/19 2104 09/10/19 0732 09/10/19 1215  GLUCAP 85 92 105* 87 121*  D-Dimer: No results for input(s): DDIMER in the last 72 hours. Hgb A1c: Recent Labs    09/09/19 0555  HGBA1C 5.6   Lipid Profile: No results for input(s): CHOL, HDL, LDLCALC, TRIG, CHOLHDL, LDLDIRECT in the last 72 hours. Thyroid function studies: Recent Labs    09/08/19 1536  TSH 1.054   Anemia work up: No results for input(s): VITAMINB12, FOLATE, FERRITIN, TIBC, IRON, RETICCTPCT in the last 72 hours. Sepsis Labs: Recent Labs  Lab 09/08/19 1348 09/09/19 0555  WBC 3.9* 4.2    Microbiology Recent Results (from the past 240 hour(s))  SARS Coronavirus 2 by RT PCR (hospital order, performed in [facility] hospital lab) Nasopharyngeal Nasopharyngeal Swab     Status: None   Collection Time: 09/08/19  1:52 PM   Specimen: Nasopharyngeal Swab  Result Value Ref Range Status   SARS Coronavirus 2 NEGATIVE NEGATIVE Final    Comment: (NOTE) SARS-CoV-2 target nucleic acids are NOT DETECTED.  The SARS-CoV-2 RNA is generally detectable in upper and lower respiratory specimens during the acute phase of infection. The lowest concentration of SARS-CoV-2 viral copies this assay can detect is 250 copies / mL. A negative result does not preclude SARS-CoV-2 infection and should not be used as the sole basis for treatment or other patient management decisions.  A negative result may occur with improper specimen collection / handling, submission of specimen other than nasopharyngeal swab,  presence of viral mutation(s) within the areas targeted by this assay, and inadequate number of viral copies (<250 copies / mL). A negative result must be combined with clinical observations, patient history, and epidemiological information.  Fact Sheet for Patients:   StrictlyIdeas.no  Fact Sheet for Healthcare Providers: BankingDealers.co.za  This test is not yet approved or  cleared by the [place] FDA and has been authorized for detection and/or diagnosis of SARS-CoV-2 by FDA under an Emergency Use Authorization (EUA).  This EUA will remain in effect (meaning this test can be used) for the duration of the COVID-19 declaration under Section 564(b)(1) of the Act, 21 U.S.C. section 360bbb-3(b)(1), unless the authorization is terminated or revoked sooner.  Performed at [facility], [place] [address] [place], [place] [address]     Procedures and diagnostic studies:  CT Head Wo Contrast  Result Date: 09/08/2019 CLINICAL DATA:  Altered mental status EXAM: CT HEAD WITHOUT CONTRAST TECHNIQUE: Contiguous axial images were obtained from the base of the skull through the vertex without intravenous contrast. COMPARISON:  CT 08/30/2019 FINDINGS: Brain: No acute territorial infarction, hemorrhage, or intracranial mass. Moderate atrophy. Moderate hypodensity in the white matter consistent with chronic small vessel ischemic change. Stable ventricle size. Vascular: No hyperdense vessels.  Carotid vascular calcification. Skull: Normal. Negative for fracture or focal lesion. Sinuses/Orbits: No acute finding. Other: None IMPRESSION: 1. No CT evidence for acute intracranial abnormality. 2. Atrophy and chronic small vessel ischemic changes of the white matter. Electronically Signed   By: Donavan Foil M.D.   On: 09/08/2019 17:45   ECHOCARDIOGRAM COMPLETE  Result Date: 09/09/2019    ECHOCARDIOGRAM REPORT   Patient Name:   Brian [name] Date of Exam:  09/09/2019 Medical Rec #:  [id number]     Height:       73.0 in Accession #:    [id number]    Weight:       194.9 lb Date of Birth:  [birth year]     BSA:          2.128 m Patient Age:  79 years      BP:           148/89 mmHg Patient Gender: M             HR:           77 bpm. Exam Location:  Inpatient Procedure: 2D Echo Indications:    syncope 780.2  History:        Patient has prior history of Echocardiogram examinations, most                 recent 01/01/2016. Parkinson's, Arrythmias:Atrial Fibrillation;                 Risk Factors:Hypertension and Dyslipidemia.  Sonographer:    Johny Chess Referring Phys: [id number] [place]  1. Left ventricular ejection fraction, by estimation, is 60 to 65%. The left ventricle has normal function. The left ventricle has no regional wall motion abnormalities. Left ventricular diastolic function could not be evaluated.  2. Right ventricular systolic function is normal. The right ventricular size is normal. There is normal pulmonary artery systolic pressure. The estimated right ventricular systolic pressure is 14.7 mmHg.  3. Left atrial size was severely dilated.  4. Right atrial size was moderately dilated.  5. The mitral valve is normal in structure. Mild mitral valve regurgitation. No evidence of mitral stenosis.  6. The aortic valve is normal in structure. Aortic valve regurgitation is mild. Mild aortic valve sclerosis is present, with no evidence of aortic valve stenosis.  7. The inferior vena cava is normal in size with greater than 50% respiratory variability, suggesting right atrial pressure of 3 mmHg. FINDINGS  Left Ventricle: Left ventricular ejection fraction, by estimation, is 60 to 65%. The left ventricle has normal function. The left ventricle has no regional wall motion abnormalities. The left ventricular internal cavity size was normal in size. There is  no left ventricular hypertrophy. Left ventricular diastolic function could not be evaluated  due to atrial fibrillation. Left ventricular diastolic function could not be evaluated. Indeterminate filling pressures. Right Ventricle: The right ventricular size is normal. No increase in right ventricular wall thickness. Right ventricular systolic function is normal. There is normal pulmonary artery systolic pressure. The tricuspid regurgitant velocity is 2.81 m/s, and  with an assumed right atrial pressure of 3 mmHg, the estimated right ventricular systolic pressure is 82.9 mmHg. Left Atrium: Left atrial size was severely dilated. Right Atrium: Right atrial size was moderately dilated. Pericardium: Trivial pericardial effusion is present. The pericardial effusion is localized near the right atrium. Mitral Valve: The mitral valve is normal in structure. Normal mobility of the mitral valve leaflets. Mild mitral valve regurgitation. No evidence of mitral valve stenosis. Tricuspid Valve: The tricuspid valve is normal in structure. Tricuspid valve regurgitation is mild . No evidence of tricuspid stenosis. Aortic Valve: The aortic valve is normal in structure. Aortic valve regurgitation is mild. Mild aortic valve sclerosis is present, with no evidence of aortic valve stenosis. Pulmonic Valve: The pulmonic valve was normal in structure. Pulmonic valve regurgitation is not visualized. No evidence of pulmonic stenosis. Aorta: The aortic root is normal in size and structure. Venous: The inferior vena cava was not well visualized. The inferior vena cava is normal in size with greater than 50% respiratory variability, suggesting right atrial pressure of 3 mmHg. IAS/Shunts: No atrial level shunt detected by color flow Doppler.  LEFT VENTRICLE PLAX 2D LVIDd:         4.30 cm LVIDs:  3.00 cm LV PW:         1.20 cm LV IVS:        1.00 cm LVOT diam:     2.10 cm LVOT Area:     3.46 cm  RIGHT VENTRICLE RV S prime:     8.92 cm/s TAPSE (M-mode): 1.4 cm LEFT ATRIUM              Index       RIGHT ATRIUM           Index LA  diam:        5.00 cm  2.35 cm/m  RA Area:     23.10 cm LA Vol (A2C):   156.0 ml 73.31 ml/m RA Volume:   63.70 ml  29.93 ml/m LA Vol (A4C):   149.0 ml 70.02 ml/m LA Biplane Vol: 155.0 ml 72.84 ml/m   AORTA Ao Root diam: 3.20 cm Ao Asc diam:  3.50 cm TRICUSPID VALVE TR Peak grad:   31.6 mmHg TR Vmax:        281.00 cm/s  SHUNTS Systemic Diam: 2.10 cm Mihai Croitoru MD Electronically signed by Sanda Klein MD Signature Date/Time: 09/09/2019/2:46:58 PM    Final     Medications:   . apixaban  5 mg Oral BID  . carbidopa-levodopa  2 tablet Oral QHS  . carbidopa-levodopa  2 tablet Oral 6 X Daily  . cholecalciferol  1,000 Units Oral Daily  . clonazePAM  0.5 mg Oral QHS  . escitalopram  10 mg Oral QHS  . famotidine  40 mg Oral Daily  . losartan  25 mg Oral Daily  . potassium chloride  20 mEq Oral Daily  . pravastatin  40 mg Oral Daily  . rivastigmine  4.5 mg Oral BID  . sodium chloride flush  3 mL Intravenous Q12H  . sodium chloride flush  3 mL Intravenous Q12H  . tamsulosin  0.4 mg Oral QHS   Continuous Infusions: . sodium chloride       LOS: 1 day   Kincaid Tiger  [facility]     09/10/2019, 3:20 PM

## 2019-09-11 LAB — GLUCOSE, CAPILLARY
Glucose-Capillary: 95 mg/dL (ref 70–99)
Glucose-Capillary: 86 mg/dL (ref 70–99)
Glucose-Capillary: 135 mg/dL — ABNORMAL HIGH (ref 70–99)
Glucose-Capillary: 99 mg/dL (ref 70–99)

## 2019-09-11 LAB — SARS CORONAVIRUS 2 BY RT PCR (HOSPITAL ORDER, PERFORMED IN ~~LOC~~ HOSPITAL LAB): SARS Coronavirus 2: NEGATIVE

## 2019-09-11 MED ORDER — SODIUM CHLORIDE 0.9 % IV BOLUS
500.0000 mL | Freq: Once | INTRAVENOUS | Status: AC
  Administered 2019-09-11: 500 mL via INTRAVENOUS

## 2019-09-11 MED ORDER — ARTIFICIAL TEARS OPHTHALMIC OINT
TOPICAL_OINTMENT | OPHTHALMIC | Status: DC | PRN
  Filled 2019-09-11: qty 3.5

## 2019-09-11 NOTE — TOC Initial Note (Signed)
Transition of Care [facility]) - Initial/Assessment Note    Patient Details  Name: Brian [name] MRN: [id number] Date of Birth: 02-24-1941  Transition of Care [facility] CM/SW Contact:    Trula Ore, [place] Phone Number: 09/11/2019, 11:11 AM  Clinical Narrative:                  CSW spoke with patients spouse [name] by phone. Patients spouse [name] agreed to SNF placement for patient. Patients spouse gave CSW permission to fax out initial referral to [facility] area for SNF placement.  Pending bed offers.  CSW will continue to follow.  Expected Discharge Plan: Skilled Nursing Facility Barriers to Discharge: Continued Medical Work up   Patient Goals and CMS Choice   CMS Medicare.gov Compare Post Acute Care list provided to:: Patient Represenative (must comment) [name] Spouse) Choice offered to / list presented to : Spouse [name])  Expected Discharge Plan and Services Expected Discharge Plan: [place]       Living arrangements for the past 2 months: Single Family Home                                      Prior Living Arrangements/Services Living arrangements for the past 2 months: Single Family Home Lives with:: Self, Spouse Patient language and need for interpreter reviewed:: Yes Do you feel safe going back to the place where you live?: No   SNF  Need for Family Participation in Patient Care: Yes (Comment) Care giver support system in place?: Yes (comment)   Criminal Activity/Legal Involvement Pertinent to Current Situation/Hospitalization: No - Comment as needed  Activities of Daily Living      Permission Sought/Granted Permission sought to share information with : Case Manager, Family Supports, Customer service manager Permission granted to share information with : Yes, Verbal Permission Granted  Share Information with NAME: [name]  Permission granted to share info w AGENCY: SNF  Permission granted to share info w Relationship:  Spouse  Permission granted to share info w Contact Information: [name] [phone]  Emotional Assessment       Orientation: : Oriented to Self, Oriented to Place, Oriented to  Time Alcohol / Substance Use: Not Applicable Psych Involvement: No (comment)  Admission diagnosis:  Bradycardia [R00.1] Unresponsive episode [R41.89] Unresponsiveness [R41.89] Patient Active Problem List   Diagnosis Date Noted  . Unresponsiveness 09/09/2019  . Bradycardia   . Unresponsive episode 09/08/2019  . Hypoglycemia without diagnosis of diabetes mellitus 09/08/2019  . Rupture of right triceps tendon 05/20/2017  . Triceps tendon rupture, right, initial encounter 05/20/2017  . Atrial fibrillation ([place]) [I48.91] 02/05/2017  . Urinary retention 12/31/2015  . Acute renal failure (ARF) ([place]) 12/31/2015  . Acute encephalopathy 12/31/2015  . Primary osteoarthritis of right knee 07/12/2015  . Primary osteoarthritis of left knee 11/30/2014  . Encounter for therapeutic drug monitoring 06/21/2013  . Edema of extremities 02/04/2013  . Chronic anticoagulation 02/04/2013  . GERD (gastroesophageal reflux disease)   . Restrictive lung disease   . CKD (chronic kidney disease) stage 2, GFR 60-89 ml/min   . Cellulitis   . Allergic rhinitis   . Anxiety   . Long term (current) use of anticoagulants 01/25/2013  . Parkinsonism ([place]) 06/22/2012  . Paresthesias 06/22/2012  . PULMONARY NODULE 06/02/2008  . SNORING 06/02/2008  . Hyperlipidemia 05/15/2008  . Essential hypertension 05/15/2008  . PULMONARY FUNCTION TESTS, ABNORMAL 05/15/2008   PCP:  Lysle Rubens,  [place] Ar, MD Pharmacy:   [place], [place] - [address] [place] [place] [address] Phone: [phone] Fax: [phone]     Social Determinants of Health (SDOH) Interventions    Readmission Risk Interventions No flowsheet data found.

## 2019-09-11 NOTE — Progress Notes (Signed)
PROGRESS NOTE    Brian [name]  NUU:[id number] DOB: October 23, 1940 DOA: 09/08/2019 PCP: Wenda Low, MD   Brief Narrative:  Patient is a 79 year old male with history of permanent A. fib on Eliquis, Parkinson's, hypertension, hyperlipidemia, anxiety, BPH who presented to the emergency department for the evaluation of an unresponsive episode.  He presented to the emergency department earlier on 08/30/2019 with a hypotensive episode without loss of consciousness, work-up was normal at that time so he was sent home.  He was found by the occupational therapist unresponsive and cold at home.  He was noted to be hypotensive, bradycardic.  Patient was also found to be hypoglycemic on presentation.  He was started on IV fluids, IV dextrose.  Currently he remains hemodynamically stable.  Physical therapy/Occupational Therapy recommended skilled nursing facility on discharge.  Waiting for availability of the bed.  Assessment & Plan:   Principal Problem:   Unresponsive episode Active Problems:   Hyperlipidemia   Essential hypertension   Parkinsonism (HCC)   Anxiety   Atrial fibrillation (HCC) [I48.91]   Hypoglycemia without diagnosis of diabetes mellitus   Bradycardia   Unresponsiveness   Unresponsive episode: Presented with hypoglycemia, hypotension, bradycardia.  Treated with IV fluids, IV glucose.  Currently hemodynamically stable.  Suspected medication induced +/- autonomic dysfunction in the setting of parkinsonism.  Unclear reason for hypoglycemic episode.  He was also evaluated by cardiology, low suspicion for cardiac arrhythmia.  Cardiology recommended to stop doxazosin, metoprolol .  Currently blood pressure stable.  Echo showed normal left ventricle ejection fraction, severely dilated left atrium, moderate dilated right atrium.  He was also taking Lasix 40 mg daily at home which has been discontinued as there is no evidence of CHF or edema.  Serum cortisol, insulin and C-peptide within normal  limits.  Hemoglobin A1c 5.6.   Encourage adequate oral intake.  Permanent A. fib: Currently rate is well controlled.  Initially bradycardic on presentation.  Continue Eliquis for anticoagulation.  Toprol on hold.  Hypertension: Currently blood stable.  Hypotensive on presentation.  Toprol, doxazosin discontinued.  Losartan dose decreased to 25 mg daily.  Parkinsonism/dementia: Confused.Follows with neurology, Dr. Rexene Alberts.  Continue Sinemet, rivastigmine.  Continue supportive care.  Hyperlipidemia: Continue pravastatin  Anxiety: Continue as needed clonazepam  Debility/deconditioning: Patient seen by PT/OT and recommended skilled nursing facility on discharge.  Waiting for bed.             DVT prophylaxis:Eliquis Code Status: DNR Family Communication: None present at the bedside Status is: Inpatient  Remains inpatient appropriate because:Unsafe d/c plan   Dispo: The patient is from: Home              Anticipated d/c is to: SNF              Anticipated d/c date is: 1 day              Patient currently is medically stable to d/c.    Consultants: Cardiology  Procedures:None  Antimicrobials:  Anti-infectives (From admission, onward)   None      Subjective: Patient seen and examined at the bedside this morning. Hemodynamically stable. Comfortable. Confused but alert and awake and obeys commands. Denies any complaints  Objective: Vitals:   09/11/19 0356 09/11/19 0410 09/11/19 0739 09/11/19 1136  BP: 134/83 134/83 (!) 162/107 (!) 147/90  Pulse: (!) 59 62 67 69  Resp: 17 18 18 18   Temp: 97.8 F (36.6 C) 97.7 F (36.5 C) 97.9 F (36.6 C) 97.8 F (36.6 C)  TempSrc: Oral Oral Oral Oral  SpO2: 100% 100% 99% 99%  Weight: 89.3 kg 89.2 kg    Height:        Intake/Output Summary (Last 24 hours) at 09/11/2019 1305 Last data filed at 09/11/2019 0300 Gross per 24 hour  Intake 502.08 ml  Output 650 ml  Net -147.92 ml   Filed Weights   09/10/19 0307 09/11/19 0356  09/11/19 0410  Weight: 88.7 kg 89.3 kg 89.2 kg    Examination:  General exam: Appears calm and comfortable ,Not in distress,average built. Deconditioned, debilitated HEENT:PERRL,Oral mucosa moist, Ear/Nose normal on gross exam Respiratory system: Bilateral equal air entry, normal vesicular breath sounds, no wheezes or crackles  Cardiovascular system: Irregularly irregular rhythm. No JVD, murmurs, rubs, gallops or clicks. No pedal edema. Gastrointestinal system: Abdomen is nondistended, soft and nontender. No organomegaly or masses felt. Normal bowel sounds heard. Central nervous system: Alert and awake but not oriented. No focal neurological deficits. Extremities: No edema, no clubbing ,no cyanosis Skin: No rashes, lesions or ulcers,no icterus ,no pallor   Data Reviewed: I have personally reviewed following labs and imaging studies  CBC: Recent Labs  Lab 09/08/19 1348 09/09/19 0555  WBC 3.9* 4.2  NEUTROABS 2.6  --   HGB 13.4 13.3  HCT 41.1 40.4  MCV 98.3 97.6  PLT 223 846   Basic Metabolic Panel: Recent Labs  Lab 09/08/19 1348 09/08/19 1536 09/09/19 0555  NA 139  --  139  K 4.4  --  3.7  CL 101  --  104  CO2 28  --  24  GLUCOSE 40*  --  97  BUN 16  --  15  CREATININE 1.25*  --  1.04  CALCIUM 9.6  --  9.1  MG  --  1.8  --    GFR: Estimated Creatinine Clearance: 67.2 mL/min (by C-G formula based on SCr of 1.04 mg/dL). Liver Function Tests: Recent Labs  Lab 09/08/19 1348  AST 24  ALT 8  ALKPHOS 45  BILITOT 1.2  PROT 6.8  ALBUMIN 3.9   No results for input(s): LIPASE, AMYLASE in the last 168 hours. No results for input(s): AMMONIA in the last 168 hours. Coagulation Profile: No results for input(s): INR, PROTIME in the last 168 hours. Cardiac Enzymes: No results for input(s): CKTOTAL, CKMB, CKMBINDEX, TROPONINI in the last 168 hours. BNP (last 3 results) No results for input(s): PROBNP in the last 8760 hours. HbA1C: Recent Labs    09/09/19 0555    HGBA1C 5.6   CBG: Recent Labs  Lab 09/10/19 1215 09/10/19 1707 09/10/19 2108 09/11/19 0840 09/11/19 1135  GLUCAP 121* 109* 104* 95 86   Lipid Profile: No results for input(s): CHOL, HDL, LDLCALC, TRIG, CHOLHDL, LDLDIRECT in the last 72 hours. Thyroid Function Tests: Recent Labs    09/08/19 1536  TSH 1.054   Anemia Panel: No results for input(s): VITAMINB12, FOLATE, FERRITIN, TIBC, IRON, RETICCTPCT in the last 72 hours. Sepsis Labs: No results for input(s): PROCALCITON, LATICACIDVEN in the last 168 hours.  Recent Results (from the past 240 hour(s))  SARS Coronavirus 2 by RT PCR (hospital order, performed in [facility] hospital lab) Nasopharyngeal Nasopharyngeal Swab     Status: None   Collection Time: 09/08/19  1:52 PM   Specimen: Nasopharyngeal Swab  Result Value Ref Range Status   SARS Coronavirus 2 NEGATIVE NEGATIVE Final    Comment: (NOTE) SARS-CoV-2 target nucleic acids are NOT DETECTED.  The SARS-CoV-2 RNA is generally detectable in upper and  lower respiratory specimens during the acute phase of infection. The lowest concentration of SARS-CoV-2 viral copies this assay can detect is 250 copies / mL. A negative result does not preclude SARS-CoV-2 infection and should not be used as the sole basis for treatment or other patient management decisions.  A negative result may occur with improper specimen collection / handling, submission of specimen other than nasopharyngeal swab, presence of viral mutation(s) within the areas targeted by this assay, and inadequate number of viral copies (<250 copies / mL). A negative result must be combined with clinical observations, patient history, and epidemiological information.  Fact Sheet for Patients:   StrictlyIdeas.no  Fact Sheet for Healthcare Providers: BankingDealers.co.za  This test is not yet approved or  cleared by the [place] FDA and has been authorized for  detection and/or diagnosis of SARS-CoV-2 by FDA under an Emergency Use Authorization (EUA).  This EUA will remain in effect (meaning this test can be used) for the duration of the COVID-19 declaration under Section 564(b)(1) of the Act, 21 U.S.C. section 360bbb-3(b)(1), unless the authorization is terminated or revoked sooner.  Performed at [facility], [place] [address], [place], [place] [address]          Radiology Studies: ECHOCARDIOGRAM COMPLETE  Result Date: 09/09/2019    ECHOCARDIOGRAM REPORT   Patient Name:   Brian [name] Date of Exam: 09/09/2019 Medical Rec #:  [id number]     Height:       73.0 in Accession #:    [id number]    Weight:       194.9 lb Date of Birth:  Sep 21, 1940     BSA:          2.128 m Patient Age:    70 years      BP:           148/89 mmHg Patient Gender: M             HR:           77 bpm. Exam Location:  Inpatient Procedure: 2D Echo Indications:    syncope 780.2  History:        Patient has prior history of Echocardiogram examinations, most                 recent 01/01/2016. Parkinson's, Arrythmias:Atrial Fibrillation;                 Risk Factors:Hypertension and Dyslipidemia.  Sonographer:    Johny Chess Referring Phys: [id number] [place]  1. Left ventricular ejection fraction, by estimation, is 60 to 65%. The left ventricle has normal function. The left ventricle has no regional wall motion abnormalities. Left ventricular diastolic function could not be evaluated.  2. Right ventricular systolic function is normal. The right ventricular size is normal. There is normal pulmonary artery systolic pressure. The estimated right ventricular systolic pressure is 25.3 mmHg.  3. Left atrial size was severely dilated.  4. Right atrial size was moderately dilated.  5. The mitral valve is normal in structure. Mild mitral valve regurgitation. No evidence of mitral stenosis.  6. The aortic valve is normal in structure. Aortic valve regurgitation is mild.  Mild aortic valve sclerosis is present, with no evidence of aortic valve stenosis.  7. The inferior vena cava is normal in size with greater than 50% respiratory variability, suggesting right atrial pressure of 3 mmHg. FINDINGS  Left Ventricle: Left ventricular ejection fraction, by estimation, is 60 to 65%. The left  ventricle has normal function. The left ventricle has no regional wall motion abnormalities. The left ventricular internal cavity size was normal in size. There is  no left ventricular hypertrophy. Left ventricular diastolic function could not be evaluated due to atrial fibrillation. Left ventricular diastolic function could not be evaluated. Indeterminate filling pressures. Right Ventricle: The right ventricular size is normal. No increase in right ventricular wall thickness. Right ventricular systolic function is normal. There is normal pulmonary artery systolic pressure. The tricuspid regurgitant velocity is 2.81 m/s, and  with an assumed right atrial pressure of 3 mmHg, the estimated right ventricular systolic pressure is 81.8 mmHg. Left Atrium: Left atrial size was severely dilated. Right Atrium: Right atrial size was moderately dilated. Pericardium: Trivial pericardial effusion is present. The pericardial effusion is localized near the right atrium. Mitral Valve: The mitral valve is normal in structure. Normal mobility of the mitral valve leaflets. Mild mitral valve regurgitation. No evidence of mitral valve stenosis. Tricuspid Valve: The tricuspid valve is normal in structure. Tricuspid valve regurgitation is mild . No evidence of tricuspid stenosis. Aortic Valve: The aortic valve is normal in structure. Aortic valve regurgitation is mild. Mild aortic valve sclerosis is present, with no evidence of aortic valve stenosis. Pulmonic Valve: The pulmonic valve was normal in structure. Pulmonic valve regurgitation is not visualized. No evidence of pulmonic stenosis. Aorta: The aortic root is normal in  size and structure. Venous: The inferior vena cava was not well visualized. The inferior vena cava is normal in size with greater than 50% respiratory variability, suggesting right atrial pressure of 3 mmHg. IAS/Shunts: No atrial level shunt detected by color flow Doppler.  LEFT VENTRICLE PLAX 2D LVIDd:         4.30 cm LVIDs:         3.00 cm LV PW:         1.20 cm LV IVS:        1.00 cm LVOT diam:     2.10 cm LVOT Area:     3.46 cm  RIGHT VENTRICLE RV S prime:     8.92 cm/s TAPSE (M-mode): 1.4 cm LEFT ATRIUM              Index       RIGHT ATRIUM           Index LA diam:        5.00 cm  2.35 cm/m  RA Area:     23.10 cm LA Vol (A2C):   156.0 ml 73.31 ml/m RA Volume:   63.70 ml  29.93 ml/m LA Vol (A4C):   149.0 ml 70.02 ml/m LA Biplane Vol: 155.0 ml 72.84 ml/m   AORTA Ao Root diam: 3.20 cm Ao Asc diam:  3.50 cm TRICUSPID VALVE TR Peak grad:   31.6 mmHg TR Vmax:        281.00 cm/s  SHUNTS Systemic Diam: 2.10 cm Mihai Croitoru MD Electronically signed by Sanda Klein MD Signature Date/Time: 09/09/2019/2:46:58 PM    Final         Scheduled Meds: . apixaban  5 mg Oral BID  . carbidopa-levodopa  2 tablet Oral QHS  . carbidopa-levodopa  2 tablet Oral 6 X Daily  . cholecalciferol  1,000 Units Oral Daily  . clonazePAM  0.5 mg Oral QHS  . escitalopram  10 mg Oral QHS  . famotidine  40 mg Oral Daily  . losartan  25 mg Oral Daily  . potassium chloride  20 mEq Oral Daily  .  pravastatin  40 mg Oral Daily  . rivastigmine  4.5 mg Oral BID  . sodium chloride flush  3 mL Intravenous Q12H  . sodium chloride flush  3 mL Intravenous Q12H  . tamsulosin  0.4 mg Oral QHS   Continuous Infusions: . sodium chloride       LOS: 2 days    Time spent: 25 mins.More than 50% of that time was spent in counseling and/or coordination of care.      Shelly Coss, MD [facility] P6/20/2021, 1:05 PM

## 2019-09-11 NOTE — NC FL2 (Signed)
[place] MEDICAID FL2 LEVEL OF CARE SCREENING TOOL     IDENTIFICATION  Patient Name: Brian [name] Birthdate: 01/02/1941 Sex: male Admission Date (Current Location): 09/08/2019  [facility] and [place] Number:  Herbalist and Address:  [facility] [place], [place] [address], [place], [place] [address]      Provider Number: [id number]  Attending Physician Name and Address:  Shelly Coss, MD  Relative Name and Phone Number:  [name] [phone]    Current Level of Care: Hospital Recommended Level of Care: [place] Prior Approval Number:    Date Approved/Denied:   PASRR Number: [id number]  Discharge Plan: SNF    Current Diagnoses: Patient Active Problem List   Diagnosis Date Noted  . Unresponsiveness 09/09/2019  . Bradycardia   . Unresponsive episode 09/08/2019  . Hypoglycemia without diagnosis of diabetes mellitus 09/08/2019  . Rupture of right triceps tendon 05/20/2017  . Triceps tendon rupture, right, initial encounter 05/20/2017  . Atrial fibrillation ([place]) [I48.91] 02/05/2017  . Urinary retention 12/31/2015  . Acute renal failure (ARF) ([place]) 12/31/2015  . Acute encephalopathy 12/31/2015  . Primary osteoarthritis of right knee 07/12/2015  . Primary osteoarthritis of left knee 11/30/2014  . Encounter for therapeutic drug monitoring 06/21/2013  . Edema of extremities 02/04/2013  . Chronic anticoagulation 02/04/2013  . GERD (gastroesophageal reflux disease)   . Restrictive lung disease   . CKD (chronic kidney disease) stage 2, GFR 60-89 ml/min   . Cellulitis   . Allergic rhinitis   . Anxiety   . Long term (current) use of anticoagulants 01/25/2013  . Parkinsonism ([place]) 06/22/2012  . Paresthesias 06/22/2012  . PULMONARY NODULE 06/02/2008  . SNORING 06/02/2008  . Hyperlipidemia 05/15/2008  . Essential hypertension 05/15/2008  . PULMONARY FUNCTION TESTS, ABNORMAL 05/15/2008    Orientation RESPIRATION BLADDER Height &  Weight     Self, Time, Place  Normal Incontinent, External catheter (External Urinary Catheter) Weight: 196 lb 10.4 oz (89.2 kg) Height:  6\' 1"  (185.4 cm)  BEHAVIORAL SYMPTOMS/MOOD NEUROLOGICAL BOWEL NUTRITION STATUS      Continent Diet (See Discharge Summary)  AMBULATORY STATUS COMMUNICATION OF NEEDS Skin   Limited Assist Verbally Normal                       Personal Care Assistance Level of Assistance  Bathing, Dressing, Feeding Bathing Assistance: Maximum assistance Feeding assistance: Independent (able to feed self;Needs help sitting up) Dressing Assistance: Maximum assistance     Functional Limitations Info  Sight, Speech, Hearing Sight Info: Adequate Hearing Info: Adequate Speech Info: Adequate    SPECIAL CARE FACTORS FREQUENCY  PT (By licensed PT), OT (By licensed OT)     PT Frequency: 5x min weekly OT Frequency: 5x min weekly            Contractures Contractures Info: Not present    Additional Factors Info  Code Status, Allergies Code Status Info: DNR Allergies Info: Finasteride,Lisinopril           Current Medications (09/11/2019):  This is the current hospital active medication list Current Facility-Administered Medications  Medication Dose Route Frequency Provider Last Rate Last Admin  . 0.9 %  sodium chloride infusion  250 mL Intravenous PRN Sherwood Gambler, MD      . acetaminophen (TYLENOL) tablet 650 mg  650 mg Oral Q6H PRN Lenore Cordia, MD       Or  . acetaminophen (TYLENOL) suppository 650 mg  650 mg Rectal Q6H  PRN Lenore Cordia, MD      . apixaban Arne Cleveland) tablet 5 mg  5 mg Oral BID Zada Finders R, MD   5 mg at 09/11/19 1000  . carbidopa-levodopa (SINEMET CR) 50-200 MG per tablet controlled release 2 tablet  2 tablet Oral QHS Jennye Boroughs, MD   2 tablet at 09/10/19 2204  . carbidopa-levodopa (SINEMET IR) 25-100 MG per tablet immediate release 2 tablet  2 tablet Oral 6 X Daily Jennye Boroughs, MD   2 tablet at 09/11/19 1000  .  cholecalciferol (VITAMIN D3) tablet 1,000 Units  1,000 Units Oral Daily Lenore Cordia, MD   1,000 Units at 09/11/19 1000  . clonazePAM (KLONOPIN) tablet 0.5 mg  0.5 mg Oral QHS Zada Finders R, MD   0.5 mg at 09/10/19 2204  . escitalopram (LEXAPRO) tablet 10 mg  10 mg Oral QHS Jennye Boroughs, MD   10 mg at 09/10/19 2204  . famotidine (PEPCID) tablet 40 mg  40 mg Oral Daily Lenore Cordia, MD   40 mg at 09/11/19 0959  . losartan (COZAAR) tablet 25 mg  25 mg Oral Daily Buford Dresser, MD   25 mg at 09/11/19 1000  . potassium chloride (KLOR-CON) CR tablet 20 mEq  20 mEq Oral Daily Jennye Boroughs, MD   20 mEq at 09/11/19 1000  . pravastatin (PRAVACHOL) tablet 40 mg  40 mg Oral Daily Zada Finders R, MD   40 mg at 09/11/19 1000  . rivastigmine (EXELON) capsule 4.5 mg  4.5 mg Oral BID Jennye Boroughs, MD   4.5 mg at 09/11/19 0959  . sodium chloride flush (NS) 0.9 % injection 3 mL  3 mL Intravenous Q12H Sherwood Gambler, MD   3 mL at 09/10/19 2203  . sodium chloride flush (NS) 0.9 % injection 3 mL  3 mL Intravenous PRN Sherwood Gambler, MD      . sodium chloride flush (NS) 0.9 % injection 3 mL  3 mL Intravenous Q12H Zada Finders R, MD      . tamsulosin (FLOMAX) capsule 0.4 mg  0.4 mg Oral QHS Jennye Boroughs, MD   0.4 mg at 09/10/19 2204     Discharge Medications: Please see discharge summary for a list of discharge medications.  Relevant Imaging Results:  Relevant Lab Results:   Additional Information [phone]  Trula Ore, LCSWA

## 2019-09-12 LAB — GLUCOSE, CAPILLARY
Glucose-Capillary: 85 mg/dL (ref 70–99)
Glucose-Capillary: 96 mg/dL (ref 70–99)
Glucose-Capillary: 96 mg/dL (ref 70–99)

## 2019-09-12 NOTE — TOC Progression Note (Signed)
Transition of Care [facility]) - Progression Note    Patient Details  Name: Brian [name] MRN: [id number] Date of Birth: 07/31/1940  Transition of Care [facility] CM/SW [place], [place] Phone Number: 09/12/2019, 4:51 PM  Clinical Narrative:     CSW spoke with patients spouse [name] by phone who has chosen [facility] for patient for SNF placement.   Patient has bed at [facility] when medically ready for discharge.  CSW will continue to follow.  Expected Discharge Plan: [place] Barriers to Discharge: Continued Medical Work up  Expected Discharge Plan and Services Expected Discharge Plan: [place] arrangements for the past 2 months: Single Family Home                                       Social Determinants of Health (SDOH) Interventions    Readmission Risk Interventions No flowsheet data found.

## 2019-09-12 NOTE — Progress Notes (Signed)
Physical Therapy Treatment Patient Details Name: Brian [name] MRN: [id number] DOB: 29-Nov-1940 Today's Date: 09/12/2019    History of Present Illness Pt is a 79 y.o. male with PMHx including Afib, parkinsonism, hypertension, hyperlipidemia, anxiety, BPH, hx of bil TKA, who presents to the ED for an unresponsive episode. CT negative for acute changes. Of note pt with previous ED visit on 6/8 for hypotensive episode without loss of consciousness and was d/c home.     PT Comments    Pt was seen for mobility on bed and recovery of AROM to legs with assisted ex's.  Pt declined to try to sit on side of bed, but was able to follow repetitive multimodal cues with extra time.  Pt is actively resisting some movements with legs, but with understanding could then help more to increase active mobility.  Follow acutely for progression of his mobility as tolerated, to return home as he is able.   Follow Up Recommendations  SNF     Equipment Recommendations  None recommended by PT    Recommendations for Other Services       Precautions / Restrictions Precautions Precautions: Fall Precaution Comments: slow to respond to VC's Restrictions Weight Bearing Restrictions: No    Mobility  Bed Mobility Overal bed mobility: Needs Assistance             General bed mobility comments: max assist to scoot up in bed  Transfers                 General transfer comment: declined  Ambulation/Gait                 Stairs             Wheelchair Mobility    Modified Rankin (Stroke Patients Only)       Balance                                            Cognition Arousal/Alertness: Lethargic;Awake/alert Behavior During Therapy: Flat affect Overall Cognitive Status: Impaired/Different from baseline Area of Impairment: Problem solving;Attention;Memory;Following commands;Safety/judgement;Awareness                   Current Attention Level:  Selective Memory: Decreased short-term memory Following Commands: Follows one step commands inconsistently;Follows one step commands with increased time Safety/Judgement: Decreased awareness of deficits;Decreased awareness of safety Awareness: Intellectual Problem Solving: Slow processing;Requires verbal cues;Requires tactile cues;Decreased initiation        Exercises General Exercises - Lower Extremity Ankle Circles/Pumps: AAROM;5 reps Quad Sets: AAROM;5 reps Gluteal Sets: AAROM;5 reps Heel Slides: AAROM;10 reps Hip ABduction/ADduction: AAROM;10 reps Straight Leg Raises: AAROM;10 reps Hip Flexion/Marching: AAROM;10 reps    General Comments        Pertinent Vitals/Pain Pain Assessment: Faces Faces Pain Scale: No hurt    Home Living                      Prior Function            PT Goals (current goals can now be found in the care plan section) Acute Rehab PT Goals Patient Stated Goal: none stated    Frequency    Min 3X/week      PT Plan Current plan remains appropriate    Co-evaluation              AM-PAC PT "  6 Clicks" Mobility   Outcome Measure  Help needed turning from your back to your side while in a flat bed without using bedrails?: A Lot Help needed moving from lying on your back to sitting on the side of a flat bed without using bedrails?: A Lot Help needed moving to and from a bed to a chair (including a wheelchair)?: Total Help needed standing up from a chair using your arms (e.g., wheelchair or bedside chair)?: Total Help needed to walk in hospital room?: Total Help needed climbing 3-5 steps with a railing? : Total 6 Click Score: 8    End of Session   Activity Tolerance: Patient tolerated treatment well Patient left: in bed;with call bell/phone within reach;with bed alarm set Nurse Communication: Mobility status PT Visit Diagnosis: Unsteadiness on feet (R26.81);Muscle weakness (generalized) (M62.81);Difficulty in walking, not  elsewhere classified (R26.2)     Time: 3383-2919 PT Time Calculation (min) (ACUTE ONLY): 25 min  Charges:  $Therapeutic Exercise: 8-22 mins $Therapeutic Activity: 8-22 mins                    Ramond Dial 09/12/2019, 5:53 PM  Mee Hives, PT MS Acute Rehab Dept. Number: [place]

## 2019-09-12 NOTE — TOC Progression Note (Signed)
Transition of Care [facility]) - Progression Note    Patient Details  Name: Brian [name] MRN: [id number] Date of Birth: 12-04-1940  Transition of Care [facility]) CM/SW [place], [place] Phone Number: 09/12/2019, 11:11 AM  Clinical Narrative:     CSW spoke with patients spouse [name] and provided SNF bed offers. Patients spouse [name] will call CSW back with SNF choice.  SNF choice pending.  CSW will continue to follow.  Expected Discharge Plan: [place] Barriers to Discharge: Continued Medical Work up  Expected Discharge Plan and Services Expected Discharge Plan: [place] arrangements for the past 2 months: Single Family Home                                       Social Determinants of Health (SDOH) Interventions    Readmission Risk Interventions No flowsheet data found.

## 2019-09-12 NOTE — Progress Notes (Signed)
PROGRESS NOTE    Brian [name]  LGX:[id number] DOB: [birth year] DOA: 09/08/2019 PCP: Wenda Low, MD   Brief Narrative:  Patient is a 79 year old male with history of permanent A. fib on Eliquis, Parkinson's, hypertension, hyperlipidemia, anxiety, BPH who presented to the emergency department for the evaluation of an unresponsive episode.  He presented to the emergency department earlier on 08/30/2019 with a hypotensive episode without loss of consciousness, work-up was normal at that time so he was sent home.  He was found by the occupational therapist unresponsive and cold at home.  He was noted to be hypotensive, bradycardic.  Patient was also found to be hypoglycemic on presentation.  He was started on IV fluids, IV dextrose.  Currently he remains hemodynamically stable.  Physical therapy/Occupational Therapy recommended skilled nursing facility on discharge.  Waiting for availability of the bed.  Assessment & Plan:   Principal Problem:   Unresponsive episode Active Problems:   Hyperlipidemia   Essential hypertension   Parkinsonism (HCC)   Anxiety   Atrial fibrillation (HCC) [I48.91]   Hypoglycemia without diagnosis of diabetes mellitus   Bradycardia   Unresponsiveness   Unresponsive episode: Presented with hypoglycemia, hypotension, bradycardia.  Treated with IV fluids, IV glucose.  Currently hemodynamically stable.  Suspected medication induced +/- autonomic dysfunction in the setting of parkinsonism.  Unclear reason for hypoglycemic episode.  He was also evaluated by cardiology, low suspicion for cardiac arrhythmia.  Cardiology recommended to stop doxazosin, metoprolol .  Currently blood pressure stable.  Echo showed normal left ventricle ejection fraction, severely dilated left atrium, moderate dilated right atrium.  He was also taking Lasix 40 mg daily at home which has been discontinued as there is no evidence of CHF or edema.  Serum cortisol, insulin and C-peptide within normal  limits.  Hemoglobin A1c 5.6.   Encourage adequate oral intake.  Permanent A. fib: Currently rate is well controlled.  Initially bradycardic on presentation.  Continue Eliquis for anticoagulation.  Toprol on hold.  Hypertension: Currently blood stable.  Hypotensive on presentation.  Toprol, doxazosin discontinued.  Losartan dose decreased to 25 mg daily.  Parkinsonism/dementia: Confused.Follows with neurology, Dr. Rexene Alberts.  Continue Sinemet, rivastigmine.  Continue supportive care.  Hyperlipidemia: Continue pravastatin  Anxiety: Continue as needed clonazepam  Debility/deconditioning: Patient seen by PT/OT and recommended skilled nursing facility on discharge.  Waiting for bed.             DVT prophylaxis:Eliquis Code Status: DNR Family Communication: None present at the bedside Status is: Inpatient  Remains inpatient appropriate because:Unsafe d/c plan   Dispo: The patient is from: Home              Anticipated d/c is to: SNF              Anticipated d/c date is: On the day of availability of SNF bed              Patient currently is medically stable to d/c.    Consultants: Cardiology  Procedures:None  Antimicrobials:  Anti-infectives (From admission, onward)   None      Subjective: Patient seen and examined at bedside this morning.  Hemodynamically stable.  Comfortable.  No new changes from yesterday.  Objective: Vitals:   09/11/19 1620 09/11/19 2012 09/12/19 0025 09/12/19 0408  BP: (!) 141/85 124/82 (!) 128/94 124/73  Pulse: 76 (!) 58 (!) 59 (!) 56  Resp: 20 20 20 18   Temp: 98.1 F (36.7 C) 98.3 F (36.8 C) 98 F (36.7 C)  98.3 F (36.8 C)  TempSrc: Oral Oral Oral Oral  SpO2: 100% 98% 98% 99%  Weight:    89.6 kg  Height:        Intake/Output Summary (Last 24 hours) at 09/12/2019 0847 Last data filed at 09/11/2019 1711 Gross per 24 hour  Intake --  Output 300 ml  Net -300 ml   Filed Weights   09/11/19 0356 09/11/19 0410 09/12/19 0408  Weight:  89.3 kg 89.2 kg 89.6 kg    Examination:  General exam: Appears calm and comfortable ,Not in distress,average built. Deconditioned, debilitated HEENT:PERRL,Oral mucosa moist, Ear/Nose normal on gross exam Respiratory system: Bilateral equal air entry, normal vesicular breath sounds, no wheezes or crackles  Cardiovascular system: Irregularly irregular rhythm. No JVD, murmurs, rubs, gallops or clicks. No pedal edema. Gastrointestinal system: Abdomen is nondistended, soft and nontender. No organomegaly or masses felt. Normal bowel sounds heard. Central nervous system: Alert and awake but not oriented. No focal neurological deficits. Extremities: No edema, no clubbing ,no cyanosis Skin: No rashes, lesions or ulcers,no icterus ,no pallor   Data Reviewed: I have personally reviewed following labs and imaging studies  CBC: Recent Labs  Lab 09/08/19 1348 09/09/19 0555  WBC 3.9* 4.2  NEUTROABS 2.6  --   HGB 13.4 13.3  HCT 41.1 40.4  MCV 98.3 97.6  PLT 223 259   Basic Metabolic Panel: Recent Labs  Lab 09/08/19 1348 09/08/19 1536 09/09/19 0555  NA 139  --  139  K 4.4  --  3.7  CL 101  --  104  CO2 28  --  24  GLUCOSE 40*  --  97  BUN 16  --  15  CREATININE 1.25*  --  1.04  CALCIUM 9.6  --  9.1  MG  --  1.8  --    GFR: Estimated Creatinine Clearance: 67.2 mL/min (by C-G formula based on SCr of 1.04 mg/dL). Liver Function Tests: Recent Labs  Lab 09/08/19 1348  AST 24  ALT 8  ALKPHOS 45  BILITOT 1.2  PROT 6.8  ALBUMIN 3.9   No results for input(s): LIPASE, AMYLASE in the last 168 hours. No results for input(s): AMMONIA in the last 168 hours. Coagulation Profile: No results for input(s): INR, PROTIME in the last 168 hours. Cardiac Enzymes: No results for input(s): CKTOTAL, CKMB, CKMBINDEX, TROPONINI in the last 168 hours. BNP (last 3 results) No results for input(s): PROBNP in the last 8760 hours. HbA1C: No results for input(s): HGBA1C in the last 72  hours. CBG: Recent Labs  Lab 09/11/19 0840 09/11/19 1135 09/11/19 1618 09/11/19 2111 09/12/19 0815  GLUCAP 95 86 135* 99 85   Lipid Profile: No results for input(s): CHOL, HDL, LDLCALC, TRIG, CHOLHDL, LDLDIRECT in the last 72 hours. Thyroid Function Tests: No results for input(s): TSH, T4TOTAL, FREET4, T3FREE, THYROIDAB in the last 72 hours. Anemia Panel: No results for input(s): VITAMINB12, FOLATE, FERRITIN, TIBC, IRON, RETICCTPCT in the last 72 hours. Sepsis Labs: No results for input(s): PROCALCITON, LATICACIDVEN in the last 168 hours.  Recent Results (from the past 240 hour(s))  SARS Coronavirus 2 by RT PCR (hospital order, performed in [facility] hospital lab) Nasopharyngeal Nasopharyngeal Swab     Status: None   Collection Time: 09/08/19  1:52 PM   Specimen: Nasopharyngeal Swab  Result Value Ref Range Status   SARS Coronavirus 2 NEGATIVE NEGATIVE Final    Comment: (NOTE) SARS-CoV-2 target nucleic acids are NOT DETECTED.  The SARS-CoV-2 RNA is generally detectable in  upper and lower respiratory specimens during the acute phase of infection. The lowest concentration of SARS-CoV-2 viral copies this assay can detect is 250 copies / mL. A negative result does not preclude SARS-CoV-2 infection and should not be used as the sole basis for treatment or other patient management decisions.  A negative result may occur with improper specimen collection / handling, submission of specimen other than nasopharyngeal swab, presence of viral mutation(s) within the areas targeted by this assay, and inadequate number of viral copies (<250 copies / mL). A negative result must be combined with clinical observations, patient history, and epidemiological information.  Fact Sheet for Patients:   StrictlyIdeas.no  Fact Sheet for Healthcare Providers: BankingDealers.co.za  This test is not yet approved or  cleared by the [place] FDA  and has been authorized for detection and/or diagnosis of SARS-CoV-2 by FDA under an Emergency Use Authorization (EUA).  This EUA will remain in effect (meaning this test can be used) for the duration of the COVID-19 declaration under Section 564(b)(1) of the Act, 21 U.S.C. section 360bbb-3(b)(1), unless the authorization is terminated or revoked sooner.  Performed at [facility], [place] [address], [place], [place] [address]   SARS Coronavirus 2 by RT PCR (hospital order, performed in [facility] hospital lab) Nasopharyngeal Nasopharyngeal Swab     Status: None   Collection Time: 09/11/19  4:42 PM   Specimen: Nasopharyngeal Swab  Result Value Ref Range Status   SARS Coronavirus 2 NEGATIVE NEGATIVE Final    Comment: (NOTE) SARS-CoV-2 target nucleic acids are NOT DETECTED.  The SARS-CoV-2 RNA is generally detectable in upper and lower respiratory specimens during the acute phase of infection. The lowest concentration of SARS-CoV-2 viral copies this assay can detect is 250 copies / mL. A negative result does not preclude SARS-CoV-2 infection and should not be used as the sole basis for treatment or other patient management decisions.  A negative result may occur with improper specimen collection / handling, submission of specimen other than nasopharyngeal swab, presence of viral mutation(s) within the areas targeted by this assay, and inadequate number of viral copies (<250 copies / mL). A negative result must be combined with clinical observations, patient history, and epidemiological information.  Fact Sheet for Patients:   StrictlyIdeas.no  Fact Sheet for Healthcare Providers: BankingDealers.co.za  This test is not yet approved or  cleared by the [place] FDA and has been authorized for detection and/or diagnosis of SARS-CoV-2 by FDA under an Emergency Use Authorization (EUA).  This EUA will remain in effect (meaning this  test can be used) for the duration of the COVID-19 declaration under Section 564(b)(1) of the Act, 21 U.S.C. section 360bbb-3(b)(1), unless the authorization is terminated or revoked sooner.  Performed at [facility], [place] [address] [place],  [address]          Radiology Studies: No results found.      Scheduled Meds: . apixaban  5 mg Oral BID  . carbidopa-levodopa  2 tablet Oral QHS  . carbidopa-levodopa  2 tablet Oral 6 X Daily  . cholecalciferol  1,000 Units Oral Daily  . clonazePAM  0.5 mg Oral QHS  . escitalopram  10 mg Oral QHS  . famotidine  40 mg Oral Daily  . losartan  25 mg Oral Daily  . potassium chloride  20 mEq Oral Daily  . pravastatin  40 mg Oral Daily  . rivastigmine  4.5 mg Oral BID  . sodium chloride flush  3 mL Intravenous Q12H  . sodium chloride flush  3 mL Intravenous Q12H  . tamsulosin  0.4 mg Oral QHS   Continuous Infusions: . sodium chloride       LOS: 3 days    Time spent: 15 mins.More than 50% of that time was spent in counseling and/or coordination of care.      Shelly Coss, MD [facility] P6/21/2021, 8:47 AM

## 2019-09-13 MED ORDER — CLONAZEPAM 0.5 MG PO TBDP: 0.5000 mg | ORAL_TABLET | Freq: Every day | ORAL | 0 refills | Status: DC

## 2019-09-13 MED ORDER — LOSARTAN POTASSIUM 25 MG PO TABS: 25.0000 mg | ORAL_TABLET | ORAL | Status: AC

## 2019-09-13 NOTE — Progress Notes (Signed)
Pt's stable, DC to [facility] via [facility], report given to [place], [place]

## 2019-09-13 NOTE — Discharge Summary (Signed)
Physician Discharge Summary  Brian [name] TIR:[id number] DOB: 06/22/1940 DOA: 09/08/2019  PCP: Wenda Low, MD  Admit date: 09/08/2019 Discharge date: 09/13/2019  Admitted From: Home Disposition:  SNF  Discharge Condition:Stable CODE STATUS: DNR Diet recommendation: Heart Healthy  Brief/Interim Summary:  Patient is a 79 year old male with history of permanent A. fib on Eliquis, Parkinson's, hypertension, hyperlipidemia, anxiety, BPH who presented to the emergency department for the evaluation of an unresponsive episode.  He presented to the emergency department earlier on 08/30/2019 with a hypotensive episode without loss of consciousness, work-up was normal at that time so he was sent home.  He was found by the occupational therapist unresponsive and cold at home.  He was noted to be hypotensive, bradycardic.  Patient was also found to be hypoglycemic on presentation.  He was started on IV fluids, IV dextrose.    Unresponsive episode was most likely associated with medications.  Currently he remains hemodynamically stable.  Physical therapy/Occupational Therapy recommended skilled nursing facility on discharge.    Following problems were addressed during his hospitalization:  Unresponsive episode: Presented with hypoglycemia, hypotension, bradycardia.  Treated with IV fluids, IV glucose.  Currently hemodynamically stable.  Suspected medication induced +/- autonomic dysfunction in the setting of parkinsonism.  Unclear reason for hypoglycemic episode.  He was also evaluated by cardiology, low suspicion for cardiac arrhythmia.  Cardiology recommended to stop doxazosin, metoprolol .  Currently blood pressure stable.  Echo showed normal left ventricle ejection fraction, severely dilated left atrium, moderate dilated right atrium.  He was also taking Lasix 40 mg daily at home which has been discontinued as there is no evidence of CHF or edema.  Serum cortisol, insulin and C-peptide within normal limits.   Hemoglobin A1c 5.6.  Encourage adequate oral intake.  Permanent A. fib: Currently rate is well controlled.  Initially bradycardic on presentation.  Continue Eliquis for anticoagulation.  Toprol has been stopped.  Hypertension: Currently blood stable.  Hypotensive on presentation.  Toprol, doxazosin discontinued.  Losartan dose decreased to 25 mg daily.  Parkinsonism/dementia: Confused.Follows with neurology, Dr. Rexene Alberts.  Continue Sinemet, rivastigmine.  Continue supportive care.  Hyperlipidemia: Continue pravastatin  Anxiety: Continue as needed clonazepam  Debility/deconditioning: Patient seen by PT/OT and recommended skilled nursing facility on discharge.       Discharge Diagnoses:  Principal Problem:   Unresponsive episode Active Problems:   Hyperlipidemia   Essential hypertension   Parkinsonism (HCC)   Anxiety   Atrial fibrillation (HCC) [I48.91]   Hypoglycemia without diagnosis of diabetes mellitus   Bradycardia   Unresponsiveness    Discharge Instructions  Discharge Instructions    Diet - low sodium heart healthy   Complete by: As directed    Discharge instructions   Complete by: As directed    Take your medications as instructed   Increase activity slowly   Complete by: As directed      Allergies as of 09/13/2019      Reactions   Finasteride Other (See Comments)   More frequent urination.   Lisinopril Cough      Medication List    STOP taking these medications   ammonium lactate 12 % cream Commonly known as: Lac-Hydrin   doxazosin 2 MG tablet Commonly known as: CARDURA   furosemide 40 MG tablet Commonly known as: LASIX   Klor-Con 10 10 MEQ tablet Generic drug: potassium chloride   metoprolol succinate 25 MG 24 hr tablet Commonly known as: TOPROL-XL     TAKE these medications   apixaban 5  MG Tabs tablet Commonly known as: Eliquis Take 1 tablet (5 mg total) by mouth 2 (two) times daily.   carbidopa-levodopa 25-100 MG tablet Commonly  known as: SINEMET IR 2 pills 6 times a day, starting at 7 AM, every 2 1/2 hours What changed:   how much to take  how to take this  when to take this  additional instructions   carbidopa-levodopa 50-200 MG tablet Commonly known as: SINEMET CR Take 2 tablets by mouth at bedtime. What changed: Another medication with the same name was changed. Make sure you understand how and when to take each.   cholecalciferol 1000 units tablet Commonly known as: VITAMIN D Take 1,000 Units by mouth daily.   clonazePAM 0.5 MG disintegrating tablet Commonly known as: KLONOPIN Take 1 tablet (0.5 mg total) by mouth at bedtime.   escitalopram 10 MG tablet Commonly known as: LEXAPRO Take 1 tablet (10 mg total) by mouth at bedtime.   famotidine 40 MG tablet Commonly known as: PEPCID Take 40 mg by mouth daily.   losartan 25 MG tablet Commonly known as: COZAAR Take 1 tablet (25 mg total) by mouth every morning. What changed:   medication strength  how much to take   multivitamin with minerals Tabs tablet Take 1 tablet by mouth daily.   pravastatin 40 MG tablet Commonly known as: PRAVACHOL Take 40 mg by mouth daily.   psyllium 58.6 % packet Commonly known as: METAMUCIL Take 1 packet by mouth daily.   rivastigmine 4.5 MG capsule Commonly known as: EXELON Take 1 capsule (4.5 mg total) by mouth 2 (two) times daily.   tamsulosin 0.4 MG Caps capsule Commonly known as: FLOMAX Take 0.4 mg at bedtime by mouth.   vitamin C 500 MG tablet Commonly known as: ASCORBIC ACID Take 500 mg by mouth daily.       Contact information for after-discharge care    Destination    [facility] .   Service: Skilled Nursing Contact information: [place] [phone]                 Allergies  Allergen Reactions  . Finasteride Other (See Comments)    More frequent urination.  . Lisinopril Cough     Consultations:  None   Procedures/Studies: CT Head Wo Contrast  Result Date: 09/08/2019 CLINICAL DATA:  Altered mental status EXAM: CT HEAD WITHOUT CONTRAST TECHNIQUE: Contiguous axial images were obtained from the base of the skull through the vertex without intravenous contrast. COMPARISON:  CT 08/30/2019 FINDINGS: Brain: No acute territorial infarction, hemorrhage, or intracranial mass. Moderate atrophy. Moderate hypodensity in the white matter consistent with chronic small vessel ischemic change. Stable ventricle size. Vascular: No hyperdense vessels.  Carotid vascular calcification. Skull: Normal. Negative for fracture or focal lesion. Sinuses/Orbits: No acute finding. Other: None IMPRESSION: 1. No CT evidence for acute intracranial abnormality. 2. Atrophy and chronic small vessel ischemic changes of the white matter. Electronically Signed   By: Donavan Foil M.D.   On: 09/08/2019 17:45   CT Head Wo Contrast  Result Date: 08/30/2019 CLINICAL DATA:  Hypotension. EXAM: CT HEAD WITHOUT CONTRAST TECHNIQUE: Contiguous axial images were obtained from the base of the skull through the vertex without intravenous contrast. COMPARISON:  January 30, 2019 FINDINGS: Brain: There is moderate cerebral atrophy with widening of the extra-axial spaces and ventricular dilatation. There are areas of decreased attenuation within the white matter tracts of the supratentorial brain, consistent with microvascular disease  changes. Vascular: No hyperdense vessel or unexpected calcification. Skull: Normal. Negative for fracture or focal lesion. Sinuses/Orbits: No acute finding. Other: A small amount of soft tissue air is seen adjacent to the superior and anterolateral aspects of the right sphenoid bone. IMPRESSION: 1. Generalized cerebral atrophy. 2. Small amount of soft tissue air adjacent to the superior and anterolateral aspects of the right sphenoid bone which may represent sequelae associated with an underlying  fracture. MRI correlation is recommended if this is of clinical concern. Electronically Signed   By: Virgina Norfolk M.D.   On: 08/30/2019 19:42   DG Chest Portable 1 View  Result Date: 09/08/2019 CLINICAL DATA:  79 year old male found unresponsive in hypotensive. EXAM: PORTABLE CHEST 1 VIEW COMPARISON:  Chest CT 11/30/2008 and earlier. FINDINGS: Portable AP upright view at 1414 hours. Mild chronic cardiomegaly. Other mediastinal contours are within normal limits. Visualized tracheal air column is within normal limits. Allowing for portable technique the lungs are clear. No pneumothorax or pleural effusion. Negative visible bowel gas pattern and osseous structures. IMPRESSION: Mild chronic cardiomegaly.  No acute cardiopulmonary abnormality. Electronically Signed   By: Genevie Ann M.D.   On: 09/08/2019 14:38   ECHOCARDIOGRAM COMPLETE  Result Date: 09/09/2019    ECHOCARDIOGRAM REPORT   Patient Name:   JOVAHN BREIT Date of Exam: 09/09/2019 Medical Rec #:  [id number]     Height:       73.0 in Accession #:    [id number]    Weight:       194.9 lb Date of Birth:  Jun 21, 1940     BSA:          2.128 m Patient Age:    89 years      BP:           148/89 mmHg Patient Gender: M             HR:           77 bpm. Exam Location:  Inpatient Procedure: 2D Echo Indications:    syncope 780.2  History:        Patient has prior history of Echocardiogram examinations, most                 recent 01/01/2016. Parkinson's, Arrythmias:Atrial Fibrillation;                 Risk Factors:Hypertension and Dyslipidemia.  Sonographer:    Johny Chess Referring Phys: [id number]  1. Left ventricular ejection fraction, by estimation, is 60 to 65%. The left ventricle has normal function. The left ventricle has no regional wall motion abnormalities. Left ventricular diastolic function could not be evaluated.  2. Right ventricular systolic function is normal. The right ventricular size is normal. There is normal  pulmonary artery systolic pressure. The estimated right ventricular systolic pressure is 30.8 mmHg.  3. Left atrial size was severely dilated.  4. Right atrial size was moderately dilated.  5. The mitral valve is normal in structure. Mild mitral valve regurgitation. No evidence of mitral stenosis.  6. The aortic valve is normal in structure. Aortic valve regurgitation is mild. Mild aortic valve sclerosis is present, with no evidence of aortic valve stenosis.  7. The inferior vena cava is normal in size with greater than 50% respiratory variability, suggesting right atrial pressure of 3 mmHg. FINDINGS  Left Ventricle: Left ventricular ejection fraction, by estimation, is 60 to 65%. The left ventricle has normal function. The left ventricle has no regional wall  motion abnormalities. The left ventricular internal cavity size was normal in size. There is  no left ventricular hypertrophy. Left ventricular diastolic function could not be evaluated due to atrial fibrillation. Left ventricular diastolic function could not be evaluated. Indeterminate filling pressures. Right Ventricle: The right ventricular size is normal. No increase in right ventricular wall thickness. Right ventricular systolic function is normal. There is normal pulmonary artery systolic pressure. The tricuspid regurgitant velocity is 2.81 m/s, and  with an assumed right atrial pressure of 3 mmHg, the estimated right ventricular systolic pressure is 47.6 mmHg. Left Atrium: Left atrial size was severely dilated. Right Atrium: Right atrial size was moderately dilated. Pericardium: Trivial pericardial effusion is present. The pericardial effusion is localized near the right atrium. Mitral Valve: The mitral valve is normal in structure. Normal mobility of the mitral valve leaflets. Mild mitral valve regurgitation. No evidence of mitral valve stenosis. Tricuspid Valve: The tricuspid valve is normal in structure. Tricuspid valve regurgitation is mild . No  evidence of tricuspid stenosis. Aortic Valve: The aortic valve is normal in structure. Aortic valve regurgitation is mild. Mild aortic valve sclerosis is present, with no evidence of aortic valve stenosis. Pulmonic Valve: The pulmonic valve was normal in structure. Pulmonic valve regurgitation is not visualized. No evidence of pulmonic stenosis. Aorta: The aortic root is normal in size and structure. Venous: The inferior vena cava was not well visualized. The inferior vena cava is normal in size with greater than 50% respiratory variability, suggesting right atrial pressure of 3 mmHg. IAS/Shunts: No atrial level shunt detected by color flow Doppler.  LEFT VENTRICLE PLAX 2D LVIDd:         4.30 cm LVIDs:         3.00 cm LV PW:         1.20 cm LV IVS:        1.00 cm LVOT diam:     2.10 cm LVOT Area:     3.46 cm  RIGHT VENTRICLE RV S prime:     8.92 cm/s TAPSE (M-mode): 1.4 cm LEFT ATRIUM              Index       RIGHT ATRIUM           Index LA diam:        5.00 cm  2.35 cm/m  RA Area:     23.10 cm LA Vol (A2C):   156.0 ml 73.31 ml/m RA Volume:   63.70 ml  29.93 ml/m LA Vol (A4C):   149.0 ml 70.02 ml/m LA Biplane Vol: 155.0 ml 72.84 ml/m   AORTA Ao Root diam: 3.20 cm Ao Asc diam:  3.50 cm TRICUSPID VALVE TR Peak grad:   31.6 mmHg TR Vmax:        281.00 cm/s  SHUNTS Systemic Diam: 2.10 cm Dani Gobble Croitoru MD Electronically signed by Sanda Klein MD Signature Date/Time: 09/09/2019/2:46:58 PM    Final        Subjective: Patient seen and examined at the bedside this morning.  Hemodynamically stable for discharge to rehab today  Discharge Exam: Vitals:   09/12/19 2049 09/13/19 0333  BP: (!) 154/98 (!) 152/89  Pulse: 85 70  Resp: 18 18  Temp: 98.6 F (37 C) 97.9 F (36.6 C)  SpO2: 97% 98%   Vitals:   09/12/19 0025 09/12/19 0408 09/12/19 2049 09/13/19 0333  BP: (!) 128/94 124/73 (!) 154/98 (!) 152/89  Pulse: (!) 59 (!) 56 85 70  Resp: 20 18  18 18  Temp: 98 F (36.7 C) 98.3 F (36.8 C) 98.6 F  (37 C) 97.9 F (36.6 C)  TempSrc: Oral Oral Oral Oral  SpO2: 98% 99% 97% 98%  Weight:  89.6 kg  89 kg  Height:        General: Pt is alert, awake, not in acute distress Cardiovascular: RRR, S1/S2 +, no rubs, no gallops Respiratory: CTA bilaterally, no wheezing, no rhonchi Abdominal: Soft, NT, ND, bowel sounds + Extremities: no edema, no cyanosis    The results of significant diagnostics from this hospitalization (including imaging, microbiology, ancillary and laboratory) are listed below for reference.     Microbiology: Recent Results (from the past 240 hour(s))  SARS Coronavirus 2 by RT PCR (hospital order, performed in [facility] hospital lab) Nasopharyngeal Nasopharyngeal Swab     Status: None   Collection Time: 09/08/19  1:52 PM   Specimen: Nasopharyngeal Swab  Result Value Ref Range Status   SARS Coronavirus 2 NEGATIVE NEGATIVE Final    Comment: (NOTE) SARS-CoV-2 target nucleic acids are NOT DETECTED.  The SARS-CoV-2 RNA is generally detectable in upper and lower respiratory specimens during the acute phase of infection. The lowest concentration of SARS-CoV-2 viral copies this assay can detect is 250 copies / mL. A negative result does not preclude SARS-CoV-2 infection and should not be used as the sole basis for treatment or other patient management decisions.  A negative result may occur with improper specimen collection / handling, submission of specimen other than nasopharyngeal swab, presence of viral mutation(s) within the areas targeted by this assay, and inadequate number of viral copies (<250 copies / mL). A negative result must be combined with clinical observations, patient history, and epidemiological information.  Fact Sheet for Patients:   StrictlyIdeas.no  Fact Sheet for Healthcare Providers: BankingDealers.co.za  This test is not yet approved or  cleared by the [place] FDA and has been  authorized for detection and/or diagnosis of SARS-CoV-2 by FDA under an Emergency Use Authorization (EUA).  This EUA will remain in effect (meaning this test can be used) for the duration of the COVID-19 declaration under Section 564(b)(1) of the Act, 21 U.S.C. section 360bbb-3(b)(1), unless the authorization is terminated or revoked sooner.  Performed at [facility], [place] [address] [place], [place] [address]   SARS Coronavirus 2 by RT PCR (hospital order, performed in [facility] hospital lab) Nasopharyngeal Nasopharyngeal Swab     Status: None   Collection Time: 09/11/19  4:42 PM   Specimen: Nasopharyngeal Swab  Result Value Ref Range Status   SARS Coronavirus 2 NEGATIVE NEGATIVE Final    Comment: (NOTE) SARS-CoV-2 target nucleic acids are NOT DETECTED.  The SARS-CoV-2 RNA is generally detectable in upper and lower respiratory specimens during the acute phase of infection. The lowest concentration of SARS-CoV-2 viral copies this assay can detect is 250 copies / mL. A negative result does not preclude SARS-CoV-2 infection and should not be used as the sole basis for treatment or other patient management decisions.  A negative result may occur with improper specimen collection / handling, submission of specimen other than nasopharyngeal swab, presence of viral mutation(s) within the areas targeted by this assay, and inadequate number of viral copies (<250 copies / mL). A negative result must be combined with clinical observations, patient history, and epidemiological information.  Fact Sheet for Patients:   StrictlyIdeas.no  Fact Sheet for Healthcare Providers: BankingDealers.co.za  This test is not yet approved or  cleared by  the [employer] and has been authorized for detection and/or diagnosis of SARS-CoV-2 by FDA under an Emergency Use Authorization (EUA).  This EUA will remain in effect (meaning this test can be  used) for the duration of the COVID-19 declaration under Section 564(b)(1) of the Act, 21 U.S.C. section 360bbb-3(b)(1), unless the authorization is terminated or revoked sooner.  Performed at [facility], [place] [address], [place], [place] [address]      Labs: BNP (last 3 results) No results for input(s): BNP in the last 8760 hours. Basic Metabolic Panel: Recent Labs  Lab 09/08/19 1348 09/08/19 1536 09/09/19 0555  NA 139  --  139  K 4.4  --  3.7  CL 101  --  104  CO2 28  --  24  GLUCOSE 40*  --  97  BUN 16  --  15  CREATININE 1.25*  --  1.04  CALCIUM 9.6  --  9.1  MG  --  1.8  --    Liver Function Tests: Recent Labs  Lab 09/08/19 1348  AST 24  ALT 8  ALKPHOS 45  BILITOT 1.2  PROT 6.8  ALBUMIN 3.9   No results for input(s): LIPASE, AMYLASE in the last 168 hours. No results for input(s): AMMONIA in the last 168 hours. CBC: Recent Labs  Lab 09/08/19 1348 09/09/19 0555  WBC 3.9* 4.2  NEUTROABS 2.6  --   HGB 13.4 13.3  HCT 41.1 40.4  MCV 98.3 97.6  PLT 223 210   Cardiac Enzymes: No results for input(s): CKTOTAL, CKMB, CKMBINDEX, TROPONINI in the last 168 hours. BNP: Invalid input(s): POCBNP CBG: Recent Labs  Lab 09/11/19 1618 09/11/19 2111 09/12/19 0815 09/12/19 1733 09/12/19 2051  GLUCAP 135* 99 85 96 96   D-Dimer No results for input(s): DDIMER in the last 72 hours. Hgb A1c No results for input(s): HGBA1C in the last 72 hours. Lipid Profile No results for input(s): CHOL, HDL, LDLCALC, TRIG, CHOLHDL, LDLDIRECT in the last 72 hours. Thyroid function studies No results for input(s): TSH, T4TOTAL, T3FREE, THYROIDAB in the last 72 hours.  Invalid input(s): FREET3 Anemia work up No results for input(s): VITAMINB12, FOLATE, FERRITIN, TIBC, IRON, RETICCTPCT in the last 72 hours. Urinalysis    Component Value Date/Time   COLORURINE STRAW (A) 09/08/2019 2031   APPEARANCEUR CLEAR 09/08/2019 2031   LABSPEC 1.006 09/08/2019 2031   PHURINE  6.0 09/08/2019 2031   GLUCOSEU NEGATIVE 09/08/2019 2031   HGBUR NEGATIVE 09/08/2019 2031   BILIRUBINUR NEGATIVE 09/08/2019 2031   KETONESUR NEGATIVE 09/08/2019 2031   PROTEINUR NEGATIVE 09/08/2019 2031   UROBILINOGEN 1.0 11/23/2014 1455   NITRITE NEGATIVE 09/08/2019 2031   LEUKOCYTESUR NEGATIVE 09/08/2019 2031   Sepsis Labs Invalid input(s): PROCALCITONIN,  WBC,  LACTICIDVEN Microbiology Recent Results (from the past 240 hour(s))  SARS Coronavirus 2 by RT PCR (hospital order, performed in [facility] hospital lab) Nasopharyngeal Nasopharyngeal Swab     Status: None   Collection Time: 09/08/19  1:52 PM   Specimen: Nasopharyngeal Swab  Result Value Ref Range Status   SARS Coronavirus 2 NEGATIVE NEGATIVE Final    Comment: (NOTE) SARS-CoV-2 target nucleic acids are NOT DETECTED.  The SARS-CoV-2 RNA is generally detectable in upper and lower respiratory specimens during the acute phase of infection. The lowest concentration of SARS-CoV-2 viral copies this assay can detect is 250 copies / mL. A negative result does not preclude SARS-CoV-2 infection and should not be used as the sole basis for treatment  or other patient management decisions.  A negative result may occur with improper specimen collection / handling, submission of specimen other than nasopharyngeal swab, presence of viral mutation(s) within the areas targeted by this assay, and inadequate number of viral copies (<250 copies / mL). A negative result must be combined with clinical observations, patient history, and epidemiological information.  Fact Sheet for Patients:   StrictlyIdeas.no  Fact Sheet for Healthcare Providers: BankingDealers.co.za  This test is not yet approved or  cleared by the [place] FDA and has been authorized for detection and/or diagnosis of SARS-CoV-2 by FDA under an Emergency Use Authorization (EUA).  This EUA will remain in effect (meaning  this test can be used) for the duration of the COVID-19 declaration under Section 564(b)(1) of the Act, 21 U.S.C. section 360bbb-3(b)(1), unless the authorization is terminated or revoked sooner.  Performed at [facility], [place] [address] [place], [place] [address]   SARS Coronavirus 2 by RT PCR (hospital order, performed in [facility] hospital lab) Nasopharyngeal Nasopharyngeal Swab     Status: None   Collection Time: 09/11/19  4:42 PM   Specimen: Nasopharyngeal Swab  Result Value Ref Range Status   SARS Coronavirus 2 NEGATIVE NEGATIVE Final    Comment: (NOTE) SARS-CoV-2 target nucleic acids are NOT DETECTED.  The SARS-CoV-2 RNA is generally detectable in upper and lower respiratory specimens during the acute phase of infection. The lowest concentration of SARS-CoV-2 viral copies this assay can detect is 250 copies / mL. A negative result does not preclude SARS-CoV-2 infection and should not be used as the sole basis for treatment or other patient management decisions.  A negative result may occur with improper specimen collection / handling, submission of specimen other than nasopharyngeal swab, presence of viral mutation(s) within the areas targeted by this assay, and inadequate number of viral copies (<250 copies / mL). A negative result must be combined with clinical observations, patient history, and epidemiological information.  Fact Sheet for Patients:   StrictlyIdeas.no  Fact Sheet for Healthcare Providers: BankingDealers.co.za  This test is not yet approved or  cleared by the [place] FDA and has been authorized for detection and/or diagnosis of SARS-CoV-2 by FDA under an Emergency Use Authorization (EUA).  This EUA will remain in effect (meaning this test can be used) for the duration of the COVID-19 declaration under Section 564(b)(1) of the Act, 21 U.S.C. section 360bbb-3(b)(1), unless the authorization is  terminated or revoked sooner.  Performed at [facility], [place] [address], [place], [place] [address]     Please note: You were cared for by a hospitalist during your hospital stay. Once you are discharged, your primary care physician will handle any further medical issues. Please note that NO REFILLS for any discharge medications will be authorized once you are discharged, as it is imperative that you return to your primary care physician (or establish a relationship with a primary care physician if you do not have one) for your post hospital discharge needs so that they can reassess your need for medications and monitor your lab values.    Time coordinating discharge: 40 minutes  SIGNED:   Shelly Coss, MD  [facility] 09/13/2019, 10:30 AM Pager [phone]  If 7PM-7AM, please contact night-coverage [email or web address] Password [email or web address]

## 2019-09-13 NOTE — Progress Notes (Signed)
Attempt to call report to [facility]. Left number for call back.

## 2019-09-13 NOTE — TOC Transition Note (Signed)
Transition of Care [facility]) - CM/SW Discharge Note   Patient Details  Name: Brian [name] MRN: [id number] Date of Birth: Jun 30, 1940  Transition of Care [facility]) CM/SW Contact:  Trula Ore, [place] Phone Number: 09/13/2019, 11:14 AM   Clinical Narrative:     Patient will DC to: [facility]  Anticipated DC date: 09/13/2019  Family notified: [name]  Transport by: Corey Harold  ?  Per MD patient ready for DC to [facility] . RN, patient, patient's family, and facility notified of DC. Discharge Summary sent to facility. RN given number for report tele#[phone]. DC packet on chart. Ambulance transport requested for patient.  CSW signing off.  Final next level of care: Skilled Nursing Facility Barriers to Discharge: No Barriers Identified   Patient Goals and CMS Choice Patient states their goals for this hospitalization and ongoing recovery are:: to go to SNF CMS Medicare.gov Compare Post Acute Care list provided to:: Patient Represenative (must comment) [name]) Choice offered to / list presented to : Spouse [name])  Discharge Placement              Patient chooses bed at: [facility] Patient to be transferred to facility by: [place] Name of family member notified: [name] Patient and family notified of of transfer: 09/13/19  Discharge Plan and Services                                     Social Determinants of Health (SDOH) Interventions     Readmission Risk Interventions No flowsheet data found.

## 2019-09-13 NOTE — Care Management Important Message (Signed)
Important Message  Patient Details  Name: Brian [name] MRN: [id number] Date of Birth: [birth year]   Medicare Important Message Given:  Yes     Orbie Pyo 09/13/2019, 3:34 PM

## 2019-09-16 LAB — PROINSULIN/INSULIN RATIO
Insulin: 0.89 u[IU]/mL
Proinsulin/Insulin Ratio: 117 %
Proinsulin: 7 pmol/L

## 2019-09-21 ENCOUNTER — Other Ambulatory Visit: Payer: Self-pay | Admitting: *Deleted

## 2019-09-21 NOTE — Patient Outreach (Signed)
Screened for potential [facility] Care Management needs as a benefit of  [employer] Medicare.  Mr. Ozment is currently receiving skilled therapy at [facility] SNF.   Writer attended telephonic interdisciplinary team meeting to assess for disposition needs and transition plan for resident.   Member is from home with spouse. Has mlutiple co-morbidities.   Will continue to follow for potential [facility] needs while member resides in SNF.  Will plan outreach as appropriate.    Marthenia Rolling, MSN-Ed, RN,BSN [place] Acute Care Coordinator [phone] [facility]) [phone]  (Toll free office)

## 2019-09-22 DIAGNOSIS — F0280 Dementia in other diseases classified elsewhere without behavioral disturbance: Secondary | ICD-10-CM | POA: Diagnosis not present

## 2019-09-22 DIAGNOSIS — R338 Other retention of urine: Secondary | ICD-10-CM | POA: Diagnosis not present

## 2019-09-22 DIAGNOSIS — N182 Chronic kidney disease, stage 2 (mild): Secondary | ICD-10-CM | POA: Diagnosis not present

## 2019-09-22 DIAGNOSIS — R351 Nocturia: Secondary | ICD-10-CM | POA: Diagnosis not present

## 2019-09-22 DIAGNOSIS — N529 Male erectile dysfunction, unspecified: Secondary | ICD-10-CM | POA: Diagnosis not present

## 2019-09-22 DIAGNOSIS — I4891 Unspecified atrial fibrillation: Secondary | ICD-10-CM | POA: Diagnosis not present

## 2019-09-22 DIAGNOSIS — G4752 REM sleep behavior disorder: Secondary | ICD-10-CM | POA: Diagnosis not present

## 2019-09-22 DIAGNOSIS — R2689 Other abnormalities of gait and mobility: Secondary | ICD-10-CM | POA: Diagnosis not present

## 2019-09-22 DIAGNOSIS — Z96653 Presence of artificial knee joint, bilateral: Secondary | ICD-10-CM | POA: Diagnosis not present

## 2019-09-22 DIAGNOSIS — I4821 Permanent atrial fibrillation: Secondary | ICD-10-CM | POA: Diagnosis not present

## 2019-09-22 DIAGNOSIS — J45909 Unspecified asthma, uncomplicated: Secondary | ICD-10-CM | POA: Diagnosis not present

## 2019-09-22 DIAGNOSIS — R1312 Dysphagia, oropharyngeal phase: Secondary | ICD-10-CM | POA: Diagnosis not present

## 2019-09-22 DIAGNOSIS — R2681 Unsteadiness on feet: Secondary | ICD-10-CM | POA: Diagnosis not present

## 2019-09-22 DIAGNOSIS — F339 Major depressive disorder, recurrent, unspecified: Secondary | ICD-10-CM | POA: Diagnosis not present

## 2019-09-22 DIAGNOSIS — G4733 Obstructive sleep apnea (adult) (pediatric): Secondary | ICD-10-CM | POA: Diagnosis not present

## 2019-09-22 DIAGNOSIS — R471 Dysarthria and anarthria: Secondary | ICD-10-CM | POA: Diagnosis not present

## 2019-09-22 DIAGNOSIS — K219 Gastro-esophageal reflux disease without esophagitis: Secondary | ICD-10-CM | POA: Diagnosis not present

## 2019-09-22 DIAGNOSIS — G20 Parkinson's disease: Secondary | ICD-10-CM | POA: Diagnosis not present

## 2019-09-22 DIAGNOSIS — Z7901 Long term (current) use of anticoagulants: Secondary | ICD-10-CM | POA: Diagnosis not present

## 2019-09-22 DIAGNOSIS — G319 Degenerative disease of nervous system, unspecified: Secondary | ICD-10-CM | POA: Diagnosis not present

## 2019-09-22 DIAGNOSIS — I10 Essential (primary) hypertension: Secondary | ICD-10-CM | POA: Diagnosis not present

## 2019-09-22 DIAGNOSIS — I051 Rheumatic mitral insufficiency: Secondary | ICD-10-CM | POA: Diagnosis not present

## 2019-09-22 DIAGNOSIS — F419 Anxiety disorder, unspecified: Secondary | ICD-10-CM | POA: Diagnosis not present

## 2019-09-22 DIAGNOSIS — R41841 Cognitive communication deficit: Secondary | ICD-10-CM | POA: Diagnosis not present

## 2019-09-22 DIAGNOSIS — N400 Enlarged prostate without lower urinary tract symptoms: Secondary | ICD-10-CM | POA: Diagnosis not present

## 2019-09-22 DIAGNOSIS — D631 Anemia in chronic kidney disease: Secondary | ICD-10-CM | POA: Diagnosis not present

## 2019-09-22 DIAGNOSIS — G3183 Dementia with Lewy bodies: Secondary | ICD-10-CM | POA: Diagnosis not present

## 2019-09-22 DIAGNOSIS — F329 Major depressive disorder, single episode, unspecified: Secondary | ICD-10-CM | POA: Diagnosis not present

## 2019-09-22 DIAGNOSIS — R911 Solitary pulmonary nodule: Secondary | ICD-10-CM | POA: Diagnosis not present

## 2019-09-22 DIAGNOSIS — I493 Ventricular premature depolarization: Secondary | ICD-10-CM | POA: Diagnosis not present

## 2019-09-22 DIAGNOSIS — N401 Enlarged prostate with lower urinary tract symptoms: Secondary | ICD-10-CM | POA: Diagnosis not present

## 2019-09-22 DIAGNOSIS — I129 Hypertensive chronic kidney disease with stage 1 through stage 4 chronic kidney disease, or unspecified chronic kidney disease: Secondary | ICD-10-CM | POA: Diagnosis not present

## 2019-09-22 DIAGNOSIS — E785 Hyperlipidemia, unspecified: Secondary | ICD-10-CM | POA: Diagnosis not present

## 2019-09-22 DIAGNOSIS — I959 Hypotension, unspecified: Secondary | ICD-10-CM | POA: Diagnosis not present

## 2019-09-29 DIAGNOSIS — I959 Hypotension, unspecified: Secondary | ICD-10-CM | POA: Diagnosis not present

## 2019-09-29 DIAGNOSIS — F0280 Dementia in other diseases classified elsewhere without behavioral disturbance: Secondary | ICD-10-CM | POA: Diagnosis not present

## 2019-09-29 DIAGNOSIS — I129 Hypertensive chronic kidney disease with stage 1 through stage 4 chronic kidney disease, or unspecified chronic kidney disease: Secondary | ICD-10-CM | POA: Diagnosis not present

## 2019-09-29 DIAGNOSIS — I4821 Permanent atrial fibrillation: Secondary | ICD-10-CM | POA: Diagnosis not present

## 2019-09-29 DIAGNOSIS — N182 Chronic kidney disease, stage 2 (mild): Secondary | ICD-10-CM | POA: Diagnosis not present

## 2019-09-29 DIAGNOSIS — G3183 Dementia with Lewy bodies: Secondary | ICD-10-CM | POA: Diagnosis not present

## 2019-10-03 ENCOUNTER — Other Ambulatory Visit: Payer: Self-pay | Admitting: *Deleted

## 2019-10-03 NOTE — Patient Outreach (Signed)
[place] Coordinator follow up  [name] is receiving skilled therapy at [facility] SNF.   Telephone call made to Travers Goodley (spouse/DPR) [phone]. Patient identifiers confirmed. Mrs. Engelmann states transition plan is for member to return home at [facility] dc. States [name] had paid caregiver assist Monday thru Saturday mornings and she provided care the rest of the time. Mrs. Rains states the goal is for member to be able to ambulate with his walker like before. States member is currently wheelchair level in SNF. States member will need to be at a level where he can transfer with limited assistance.   Mrs. Dirocco states family visits member daily in SNF. Therefore she is familiar with [name] current level of function. Mrs. Romey also states she would like for member's eyesight and hearing to be checked because he has complaint of vision and hearing difficulty. States this is new.   Explained [facility] Care Management and Remote Health services. Discussed that writer will continue to follow while member resides in SNF and plan outreach again closer to SNF discharge. Mrs. Nylund states she is seeing slight improvements and is looking for more improvements before taking member home.   Sent [facility] Care Management, Probation officer contact information, and Remote Health information via email to Mrs Kampf.  Will communicate with facility SW. Will continue to follow.   Marthenia Rolling, MSN-Ed, RN,BSN [place] Acute Care Coordinator [phone] [facility]) [phone]  (Toll free office)

## 2019-10-14 DIAGNOSIS — N182 Chronic kidney disease, stage 2 (mild): Secondary | ICD-10-CM | POA: Diagnosis not present

## 2019-10-14 DIAGNOSIS — I4821 Permanent atrial fibrillation: Secondary | ICD-10-CM | POA: Diagnosis not present

## 2019-10-14 DIAGNOSIS — I959 Hypotension, unspecified: Secondary | ICD-10-CM | POA: Diagnosis not present

## 2019-10-14 DIAGNOSIS — I129 Hypertensive chronic kidney disease with stage 1 through stage 4 chronic kidney disease, or unspecified chronic kidney disease: Secondary | ICD-10-CM | POA: Diagnosis not present

## 2019-10-14 DIAGNOSIS — F0280 Dementia in other diseases classified elsewhere without behavioral disturbance: Secondary | ICD-10-CM | POA: Diagnosis not present

## 2019-10-14 DIAGNOSIS — G3183 Dementia with Lewy bodies: Secondary | ICD-10-CM | POA: Diagnosis not present

## 2019-10-17 DIAGNOSIS — N182 Chronic kidney disease, stage 2 (mild): Secondary | ICD-10-CM | POA: Diagnosis not present

## 2019-10-17 DIAGNOSIS — N400 Enlarged prostate without lower urinary tract symptoms: Secondary | ICD-10-CM | POA: Diagnosis not present

## 2019-10-17 DIAGNOSIS — I482 Chronic atrial fibrillation, unspecified: Secondary | ICD-10-CM | POA: Diagnosis not present

## 2019-10-17 DIAGNOSIS — M25559 Pain in unspecified hip: Secondary | ICD-10-CM | POA: Diagnosis not present

## 2019-10-17 DIAGNOSIS — I4821 Permanent atrial fibrillation: Secondary | ICD-10-CM | POA: Diagnosis not present

## 2019-10-17 DIAGNOSIS — R269 Unspecified abnormalities of gait and mobility: Secondary | ICD-10-CM | POA: Diagnosis not present

## 2019-10-17 DIAGNOSIS — G3183 Dementia with Lewy bodies: Secondary | ICD-10-CM | POA: Diagnosis not present

## 2019-10-17 DIAGNOSIS — D696 Thrombocytopenia, unspecified: Secondary | ICD-10-CM | POA: Diagnosis not present

## 2019-10-17 DIAGNOSIS — K219 Gastro-esophageal reflux disease without esophagitis: Secondary | ICD-10-CM | POA: Diagnosis not present

## 2019-10-17 DIAGNOSIS — I959 Hypotension, unspecified: Secondary | ICD-10-CM | POA: Diagnosis not present

## 2019-10-17 DIAGNOSIS — I129 Hypertensive chronic kidney disease with stage 1 through stage 4 chronic kidney disease, or unspecified chronic kidney disease: Secondary | ICD-10-CM | POA: Diagnosis not present

## 2019-10-17 DIAGNOSIS — E782 Mixed hyperlipidemia: Secondary | ICD-10-CM | POA: Diagnosis not present

## 2019-10-17 DIAGNOSIS — F0280 Dementia in other diseases classified elsewhere without behavioral disturbance: Secondary | ICD-10-CM | POA: Diagnosis not present

## 2019-10-17 DIAGNOSIS — G20 Parkinson's disease: Secondary | ICD-10-CM | POA: Diagnosis not present

## 2019-10-17 DIAGNOSIS — I10 Essential (primary) hypertension: Secondary | ICD-10-CM | POA: Diagnosis not present

## 2019-10-17 DIAGNOSIS — F0390 Unspecified dementia without behavioral disturbance: Secondary | ICD-10-CM | POA: Diagnosis not present

## 2019-10-18 ENCOUNTER — Ambulatory Visit: Payer: Medicare Other | Admitting: Neurology

## 2019-10-19 ENCOUNTER — Other Ambulatory Visit: Payer: Self-pay | Admitting: *Deleted

## 2019-10-19 DIAGNOSIS — I129 Hypertensive chronic kidney disease with stage 1 through stage 4 chronic kidney disease, or unspecified chronic kidney disease: Secondary | ICD-10-CM | POA: Diagnosis not present

## 2019-10-19 DIAGNOSIS — F0280 Dementia in other diseases classified elsewhere without behavioral disturbance: Secondary | ICD-10-CM | POA: Diagnosis not present

## 2019-10-19 DIAGNOSIS — N182 Chronic kidney disease, stage 2 (mild): Secondary | ICD-10-CM | POA: Diagnosis not present

## 2019-10-19 DIAGNOSIS — I959 Hypotension, unspecified: Secondary | ICD-10-CM | POA: Diagnosis not present

## 2019-10-19 DIAGNOSIS — G3183 Dementia with Lewy bodies: Secondary | ICD-10-CM | POA: Diagnosis not present

## 2019-10-19 DIAGNOSIS — I4821 Permanent atrial fibrillation: Secondary | ICD-10-CM | POA: Diagnosis not present

## 2019-10-19 NOTE — Patient Outreach (Signed)
[facility] Post- Acute Care Coordinator follow up  Member screened for potential [facility] Care Management needs as a benefit of [place] [employer].  Verified in Patient Brian [name] that Brian [name] transitioned to home from [facility] SNF last week 10/12/19.   Telephone call made to Shakim Faith (wife/DPR) [phone] to discuss [place] Management needs. No answer. HIPAA compliant voicemail message left to request return call.  Previously spoke with Mrs. Lacuesta about [place] Management and Remote Health. 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   Will await return call and plan outreach again accordingly.   Marthenia Rolling, MSN-Ed, RN,BSN [place] Acute Care Coordinator [phone] [facility]) [phone]  (Toll free office)

## 2019-10-20 ENCOUNTER — Telehealth: Payer: Self-pay | Admitting: Neurology

## 2019-10-20 DIAGNOSIS — I129 Hypertensive chronic kidney disease with stage 1 through stage 4 chronic kidney disease, or unspecified chronic kidney disease: Secondary | ICD-10-CM | POA: Diagnosis not present

## 2019-10-20 DIAGNOSIS — N182 Chronic kidney disease, stage 2 (mild): Secondary | ICD-10-CM | POA: Diagnosis not present

## 2019-10-20 DIAGNOSIS — I959 Hypotension, unspecified: Secondary | ICD-10-CM | POA: Diagnosis not present

## 2019-10-20 DIAGNOSIS — I4821 Permanent atrial fibrillation: Secondary | ICD-10-CM | POA: Diagnosis not present

## 2019-10-20 DIAGNOSIS — G3183 Dementia with Lewy bodies: Secondary | ICD-10-CM | POA: Diagnosis not present

## 2019-10-20 DIAGNOSIS — F0280 Dementia in other diseases classified elsewhere without behavioral disturbance: Secondary | ICD-10-CM | POA: Diagnosis not present

## 2019-10-20 NOTE — Telephone Encounter (Signed)
Spoke with Dr. Rexene Alberts. Got VO for requests below.

## 2019-10-20 NOTE — Telephone Encounter (Signed)
I returned the call to [name] at [place] to provide the requested verbal orders.

## 2019-10-20 NOTE — Telephone Encounter (Signed)
Brian [name] from [facility] called stating that he is needing VO for: After recent skilled nursing stay PT eval preformed and is recommending  1 week 1 2 week 1 Pt is currently approaching the ending of the current certification period and [name] is intending to recertifying at the end of next week. Pt is needing referral for MSW for community resources to help support the care givers Also is needing Speech Therapy for swallowing.

## 2019-10-21 DIAGNOSIS — I4821 Permanent atrial fibrillation: Secondary | ICD-10-CM | POA: Diagnosis not present

## 2019-10-21 DIAGNOSIS — I959 Hypotension, unspecified: Secondary | ICD-10-CM | POA: Diagnosis not present

## 2019-10-21 DIAGNOSIS — G3183 Dementia with Lewy bodies: Secondary | ICD-10-CM | POA: Diagnosis not present

## 2019-10-21 DIAGNOSIS — I129 Hypertensive chronic kidney disease with stage 1 through stage 4 chronic kidney disease, or unspecified chronic kidney disease: Secondary | ICD-10-CM | POA: Diagnosis not present

## 2019-10-21 DIAGNOSIS — F0280 Dementia in other diseases classified elsewhere without behavioral disturbance: Secondary | ICD-10-CM | POA: Diagnosis not present

## 2019-10-21 DIAGNOSIS — N182 Chronic kidney disease, stage 2 (mild): Secondary | ICD-10-CM | POA: Diagnosis not present

## 2019-10-25 DIAGNOSIS — I4821 Permanent atrial fibrillation: Secondary | ICD-10-CM | POA: Diagnosis not present

## 2019-10-25 DIAGNOSIS — F0280 Dementia in other diseases classified elsewhere without behavioral disturbance: Secondary | ICD-10-CM | POA: Diagnosis not present

## 2019-10-25 DIAGNOSIS — I129 Hypertensive chronic kidney disease with stage 1 through stage 4 chronic kidney disease, or unspecified chronic kidney disease: Secondary | ICD-10-CM | POA: Diagnosis not present

## 2019-10-25 DIAGNOSIS — G3183 Dementia with Lewy bodies: Secondary | ICD-10-CM | POA: Diagnosis not present

## 2019-10-25 DIAGNOSIS — I959 Hypotension, unspecified: Secondary | ICD-10-CM | POA: Diagnosis not present

## 2019-10-25 DIAGNOSIS — N182 Chronic kidney disease, stage 2 (mild): Secondary | ICD-10-CM | POA: Diagnosis not present

## 2019-10-26 DIAGNOSIS — F0280 Dementia in other diseases classified elsewhere without behavioral disturbance: Secondary | ICD-10-CM | POA: Diagnosis not present

## 2019-10-26 DIAGNOSIS — I959 Hypotension, unspecified: Secondary | ICD-10-CM | POA: Diagnosis not present

## 2019-10-26 DIAGNOSIS — I129 Hypertensive chronic kidney disease with stage 1 through stage 4 chronic kidney disease, or unspecified chronic kidney disease: Secondary | ICD-10-CM | POA: Diagnosis not present

## 2019-10-26 DIAGNOSIS — I4821 Permanent atrial fibrillation: Secondary | ICD-10-CM | POA: Diagnosis not present

## 2019-10-26 DIAGNOSIS — G3183 Dementia with Lewy bodies: Secondary | ICD-10-CM | POA: Diagnosis not present

## 2019-10-26 DIAGNOSIS — N182 Chronic kidney disease, stage 2 (mild): Secondary | ICD-10-CM | POA: Diagnosis not present

## 2019-10-27 DIAGNOSIS — G3183 Dementia with Lewy bodies: Secondary | ICD-10-CM | POA: Diagnosis not present

## 2019-10-27 DIAGNOSIS — N182 Chronic kidney disease, stage 2 (mild): Secondary | ICD-10-CM | POA: Diagnosis not present

## 2019-10-27 DIAGNOSIS — F0280 Dementia in other diseases classified elsewhere without behavioral disturbance: Secondary | ICD-10-CM | POA: Diagnosis not present

## 2019-10-27 DIAGNOSIS — I959 Hypotension, unspecified: Secondary | ICD-10-CM | POA: Diagnosis not present

## 2019-10-27 DIAGNOSIS — I4821 Permanent atrial fibrillation: Secondary | ICD-10-CM | POA: Diagnosis not present

## 2019-10-27 DIAGNOSIS — I129 Hypertensive chronic kidney disease with stage 1 through stage 4 chronic kidney disease, or unspecified chronic kidney disease: Secondary | ICD-10-CM | POA: Diagnosis not present

## 2019-10-28 DIAGNOSIS — F0280 Dementia in other diseases classified elsewhere without behavioral disturbance: Secondary | ICD-10-CM | POA: Diagnosis not present

## 2019-10-28 DIAGNOSIS — I959 Hypotension, unspecified: Secondary | ICD-10-CM | POA: Diagnosis not present

## 2019-10-28 DIAGNOSIS — G3183 Dementia with Lewy bodies: Secondary | ICD-10-CM | POA: Diagnosis not present

## 2019-10-28 DIAGNOSIS — I129 Hypertensive chronic kidney disease with stage 1 through stage 4 chronic kidney disease, or unspecified chronic kidney disease: Secondary | ICD-10-CM | POA: Diagnosis not present

## 2019-10-28 DIAGNOSIS — I4821 Permanent atrial fibrillation: Secondary | ICD-10-CM | POA: Diagnosis not present

## 2019-10-28 DIAGNOSIS — N182 Chronic kidney disease, stage 2 (mild): Secondary | ICD-10-CM | POA: Diagnosis not present

## 2019-10-29 DIAGNOSIS — J45909 Unspecified asthma, uncomplicated: Secondary | ICD-10-CM | POA: Diagnosis not present

## 2019-10-29 DIAGNOSIS — R351 Nocturia: Secondary | ICD-10-CM | POA: Diagnosis not present

## 2019-10-29 DIAGNOSIS — R911 Solitary pulmonary nodule: Secondary | ICD-10-CM | POA: Diagnosis not present

## 2019-10-29 DIAGNOSIS — I959 Hypotension, unspecified: Secondary | ICD-10-CM | POA: Diagnosis not present

## 2019-10-29 DIAGNOSIS — I493 Ventricular premature depolarization: Secondary | ICD-10-CM | POA: Diagnosis not present

## 2019-10-29 DIAGNOSIS — G3183 Dementia with Lewy bodies: Secondary | ICD-10-CM | POA: Diagnosis not present

## 2019-10-29 DIAGNOSIS — G4733 Obstructive sleep apnea (adult) (pediatric): Secondary | ICD-10-CM | POA: Diagnosis not present

## 2019-10-29 DIAGNOSIS — H9190 Unspecified hearing loss, unspecified ear: Secondary | ICD-10-CM | POA: Diagnosis not present

## 2019-10-29 DIAGNOSIS — D631 Anemia in chronic kidney disease: Secondary | ICD-10-CM | POA: Diagnosis not present

## 2019-10-29 DIAGNOSIS — I4821 Permanent atrial fibrillation: Secondary | ICD-10-CM | POA: Diagnosis not present

## 2019-10-29 DIAGNOSIS — G319 Degenerative disease of nervous system, unspecified: Secondary | ICD-10-CM | POA: Diagnosis not present

## 2019-10-29 DIAGNOSIS — E782 Mixed hyperlipidemia: Secondary | ICD-10-CM | POA: Diagnosis not present

## 2019-10-29 DIAGNOSIS — N182 Chronic kidney disease, stage 2 (mild): Secondary | ICD-10-CM | POA: Diagnosis not present

## 2019-10-29 DIAGNOSIS — F0281 Dementia in other diseases classified elsewhere with behavioral disturbance: Secondary | ICD-10-CM | POA: Diagnosis not present

## 2019-10-29 DIAGNOSIS — I083 Combined rheumatic disorders of mitral, aortic and tricuspid valves: Secondary | ICD-10-CM | POA: Diagnosis not present

## 2019-10-29 DIAGNOSIS — Z7901 Long term (current) use of anticoagulants: Secondary | ICD-10-CM | POA: Diagnosis not present

## 2019-10-29 DIAGNOSIS — F329 Major depressive disorder, single episode, unspecified: Secondary | ICD-10-CM | POA: Diagnosis not present

## 2019-10-29 DIAGNOSIS — F419 Anxiety disorder, unspecified: Secondary | ICD-10-CM | POA: Diagnosis not present

## 2019-10-29 DIAGNOSIS — G4752 REM sleep behavior disorder: Secondary | ICD-10-CM | POA: Diagnosis not present

## 2019-10-29 DIAGNOSIS — N401 Enlarged prostate with lower urinary tract symptoms: Secondary | ICD-10-CM | POA: Diagnosis not present

## 2019-10-29 DIAGNOSIS — R471 Dysarthria and anarthria: Secondary | ICD-10-CM | POA: Diagnosis not present

## 2019-10-29 DIAGNOSIS — I1310 Hypertensive heart and chronic kidney disease without heart failure, with stage 1 through stage 4 chronic kidney disease, or unspecified chronic kidney disease: Secondary | ICD-10-CM | POA: Diagnosis not present

## 2019-10-29 DIAGNOSIS — N529 Male erectile dysfunction, unspecified: Secondary | ICD-10-CM | POA: Diagnosis not present

## 2019-10-29 DIAGNOSIS — R338 Other retention of urine: Secondary | ICD-10-CM | POA: Diagnosis not present

## 2019-10-29 DIAGNOSIS — K219 Gastro-esophageal reflux disease without esophagitis: Secondary | ICD-10-CM | POA: Diagnosis not present

## 2019-11-01 DIAGNOSIS — I1310 Hypertensive heart and chronic kidney disease without heart failure, with stage 1 through stage 4 chronic kidney disease, or unspecified chronic kidney disease: Secondary | ICD-10-CM | POA: Diagnosis not present

## 2019-11-01 DIAGNOSIS — G3183 Dementia with Lewy bodies: Secondary | ICD-10-CM | POA: Diagnosis not present

## 2019-11-01 DIAGNOSIS — D631 Anemia in chronic kidney disease: Secondary | ICD-10-CM | POA: Diagnosis not present

## 2019-11-01 DIAGNOSIS — N182 Chronic kidney disease, stage 2 (mild): Secondary | ICD-10-CM | POA: Diagnosis not present

## 2019-11-01 DIAGNOSIS — I4821 Permanent atrial fibrillation: Secondary | ICD-10-CM | POA: Diagnosis not present

## 2019-11-01 DIAGNOSIS — F0281 Dementia in other diseases classified elsewhere with behavioral disturbance: Secondary | ICD-10-CM | POA: Diagnosis not present

## 2019-11-02 DIAGNOSIS — N182 Chronic kidney disease, stage 2 (mild): Secondary | ICD-10-CM | POA: Diagnosis not present

## 2019-11-02 DIAGNOSIS — I1310 Hypertensive heart and chronic kidney disease without heart failure, with stage 1 through stage 4 chronic kidney disease, or unspecified chronic kidney disease: Secondary | ICD-10-CM | POA: Diagnosis not present

## 2019-11-02 DIAGNOSIS — I4821 Permanent atrial fibrillation: Secondary | ICD-10-CM | POA: Diagnosis not present

## 2019-11-02 DIAGNOSIS — G3183 Dementia with Lewy bodies: Secondary | ICD-10-CM | POA: Diagnosis not present

## 2019-11-02 DIAGNOSIS — F0281 Dementia in other diseases classified elsewhere with behavioral disturbance: Secondary | ICD-10-CM | POA: Diagnosis not present

## 2019-11-02 DIAGNOSIS — D631 Anemia in chronic kidney disease: Secondary | ICD-10-CM | POA: Diagnosis not present

## 2019-11-03 DIAGNOSIS — N182 Chronic kidney disease, stage 2 (mild): Secondary | ICD-10-CM | POA: Diagnosis not present

## 2019-11-03 DIAGNOSIS — I1310 Hypertensive heart and chronic kidney disease without heart failure, with stage 1 through stage 4 chronic kidney disease, or unspecified chronic kidney disease: Secondary | ICD-10-CM | POA: Diagnosis not present

## 2019-11-03 DIAGNOSIS — I4821 Permanent atrial fibrillation: Secondary | ICD-10-CM | POA: Diagnosis not present

## 2019-11-03 DIAGNOSIS — F0281 Dementia in other diseases classified elsewhere with behavioral disturbance: Secondary | ICD-10-CM | POA: Diagnosis not present

## 2019-11-03 DIAGNOSIS — D631 Anemia in chronic kidney disease: Secondary | ICD-10-CM | POA: Diagnosis not present

## 2019-11-03 DIAGNOSIS — G3183 Dementia with Lewy bodies: Secondary | ICD-10-CM | POA: Diagnosis not present

## 2019-11-04 DIAGNOSIS — N182 Chronic kidney disease, stage 2 (mild): Secondary | ICD-10-CM | POA: Diagnosis not present

## 2019-11-04 DIAGNOSIS — F0281 Dementia in other diseases classified elsewhere with behavioral disturbance: Secondary | ICD-10-CM | POA: Diagnosis not present

## 2019-11-04 DIAGNOSIS — D631 Anemia in chronic kidney disease: Secondary | ICD-10-CM | POA: Diagnosis not present

## 2019-11-04 DIAGNOSIS — I4821 Permanent atrial fibrillation: Secondary | ICD-10-CM | POA: Diagnosis not present

## 2019-11-04 DIAGNOSIS — I1310 Hypertensive heart and chronic kidney disease without heart failure, with stage 1 through stage 4 chronic kidney disease, or unspecified chronic kidney disease: Secondary | ICD-10-CM | POA: Diagnosis not present

## 2019-11-04 DIAGNOSIS — G3183 Dementia with Lewy bodies: Secondary | ICD-10-CM | POA: Diagnosis not present

## 2019-11-08 DIAGNOSIS — F0281 Dementia in other diseases classified elsewhere with behavioral disturbance: Secondary | ICD-10-CM | POA: Diagnosis not present

## 2019-11-08 DIAGNOSIS — N182 Chronic kidney disease, stage 2 (mild): Secondary | ICD-10-CM | POA: Diagnosis not present

## 2019-11-08 DIAGNOSIS — I4821 Permanent atrial fibrillation: Secondary | ICD-10-CM | POA: Diagnosis not present

## 2019-11-08 DIAGNOSIS — D631 Anemia in chronic kidney disease: Secondary | ICD-10-CM | POA: Diagnosis not present

## 2019-11-08 DIAGNOSIS — G3183 Dementia with Lewy bodies: Secondary | ICD-10-CM | POA: Diagnosis not present

## 2019-11-08 DIAGNOSIS — I1310 Hypertensive heart and chronic kidney disease without heart failure, with stage 1 through stage 4 chronic kidney disease, or unspecified chronic kidney disease: Secondary | ICD-10-CM | POA: Diagnosis not present

## 2019-11-09 ENCOUNTER — Ambulatory Visit (INDEPENDENT_AMBULATORY_CARE_PROVIDER_SITE_OTHER): Payer: Medicare Other | Admitting: Podiatry

## 2019-11-09 ENCOUNTER — Encounter: Payer: Self-pay | Admitting: Podiatry

## 2019-11-09 ENCOUNTER — Other Ambulatory Visit: Payer: Self-pay

## 2019-11-09 DIAGNOSIS — D631 Anemia in chronic kidney disease: Secondary | ICD-10-CM | POA: Diagnosis not present

## 2019-11-09 DIAGNOSIS — D689 Coagulation defect, unspecified: Secondary | ICD-10-CM

## 2019-11-09 DIAGNOSIS — G3183 Dementia with Lewy bodies: Secondary | ICD-10-CM | POA: Diagnosis not present

## 2019-11-09 DIAGNOSIS — N182 Chronic kidney disease, stage 2 (mild): Secondary | ICD-10-CM

## 2019-11-09 DIAGNOSIS — M79676 Pain in unspecified toe(s): Secondary | ICD-10-CM

## 2019-11-09 DIAGNOSIS — B351 Tinea unguium: Secondary | ICD-10-CM | POA: Diagnosis not present

## 2019-11-09 DIAGNOSIS — F0281 Dementia in other diseases classified elsewhere with behavioral disturbance: Secondary | ICD-10-CM | POA: Diagnosis not present

## 2019-11-09 DIAGNOSIS — I1310 Hypertensive heart and chronic kidney disease without heart failure, with stage 1 through stage 4 chronic kidney disease, or unspecified chronic kidney disease: Secondary | ICD-10-CM | POA: Diagnosis not present

## 2019-11-09 DIAGNOSIS — I4821 Permanent atrial fibrillation: Secondary | ICD-10-CM | POA: Diagnosis not present

## 2019-11-09 NOTE — Progress Notes (Signed)
This patient returns to my office for at risk foot care.  This patient requires this care by a professional since this patient will be at risk due to having coagulation defect.  Patient is taking eliquis. Marland Kitchen  He presents to the office with his wife.Patient is in a wheelchair.   This patient is unable to cut nails himself since the patient cannot reach his nails.These nails are painful walking and wearing shoes.  This patient presents for at risk foot care today.  General Appearance  Alert, conversant and in no acute stress.  Vascular  Dorsalis pedis and posterior tibial  pulses are palpable  bilaterally.  Capillary return is within normal limits  bilaterally. Temperature is within normal limits  bilaterally.  Neurologic  Senn-Weinstein monofilament wire test within normal limits  bilaterally. Muscle power within normal limits bilaterally.  Nails Thick disfigured discolored nails with subungual debris  from hallux to fifth toes bilaterally. No evidence of bacterial infection or drainage bilaterally.  Orthopedic  No limitations of motion  feet .  No crepitus or effusions noted.  No bony pathology or digital deformities noted.  Skin  normotropic skin with no porokeratosis noted bilaterally.  No signs of infections or ulcers noted.     Onychomycosis  Pain in right toes  Pain in left toes  Consent was obtained for treatment procedures.   Mechanical debridement of nails 1-5  bilaterally performed with a nail nipper.  Filed with dremel without incident.    Return office visit    3 months                  Told patient to return for periodic foot care and evaluation due to potential at risk complications.   Gardiner Barefoot DPM

## 2019-11-10 DIAGNOSIS — G3183 Dementia with Lewy bodies: Secondary | ICD-10-CM | POA: Diagnosis not present

## 2019-11-10 DIAGNOSIS — I1310 Hypertensive heart and chronic kidney disease without heart failure, with stage 1 through stage 4 chronic kidney disease, or unspecified chronic kidney disease: Secondary | ICD-10-CM | POA: Diagnosis not present

## 2019-11-10 DIAGNOSIS — F0281 Dementia in other diseases classified elsewhere with behavioral disturbance: Secondary | ICD-10-CM | POA: Diagnosis not present

## 2019-11-10 DIAGNOSIS — D631 Anemia in chronic kidney disease: Secondary | ICD-10-CM | POA: Diagnosis not present

## 2019-11-10 DIAGNOSIS — I4821 Permanent atrial fibrillation: Secondary | ICD-10-CM | POA: Diagnosis not present

## 2019-11-10 DIAGNOSIS — N182 Chronic kidney disease, stage 2 (mild): Secondary | ICD-10-CM | POA: Diagnosis not present

## 2019-11-15 DIAGNOSIS — F0281 Dementia in other diseases classified elsewhere with behavioral disturbance: Secondary | ICD-10-CM | POA: Diagnosis not present

## 2019-11-15 DIAGNOSIS — N182 Chronic kidney disease, stage 2 (mild): Secondary | ICD-10-CM | POA: Diagnosis not present

## 2019-11-15 DIAGNOSIS — I1310 Hypertensive heart and chronic kidney disease without heart failure, with stage 1 through stage 4 chronic kidney disease, or unspecified chronic kidney disease: Secondary | ICD-10-CM | POA: Diagnosis not present

## 2019-11-15 DIAGNOSIS — G3183 Dementia with Lewy bodies: Secondary | ICD-10-CM | POA: Diagnosis not present

## 2019-11-15 DIAGNOSIS — I4821 Permanent atrial fibrillation: Secondary | ICD-10-CM | POA: Diagnosis not present

## 2019-11-15 DIAGNOSIS — D631 Anemia in chronic kidney disease: Secondary | ICD-10-CM | POA: Diagnosis not present

## 2019-11-17 DIAGNOSIS — N182 Chronic kidney disease, stage 2 (mild): Secondary | ICD-10-CM | POA: Diagnosis not present

## 2019-11-17 DIAGNOSIS — F0281 Dementia in other diseases classified elsewhere with behavioral disturbance: Secondary | ICD-10-CM | POA: Diagnosis not present

## 2019-11-17 DIAGNOSIS — D631 Anemia in chronic kidney disease: Secondary | ICD-10-CM | POA: Diagnosis not present

## 2019-11-17 DIAGNOSIS — I1310 Hypertensive heart and chronic kidney disease without heart failure, with stage 1 through stage 4 chronic kidney disease, or unspecified chronic kidney disease: Secondary | ICD-10-CM | POA: Diagnosis not present

## 2019-11-17 DIAGNOSIS — I4821 Permanent atrial fibrillation: Secondary | ICD-10-CM | POA: Diagnosis not present

## 2019-11-17 DIAGNOSIS — G3183 Dementia with Lewy bodies: Secondary | ICD-10-CM | POA: Diagnosis not present

## 2019-11-18 DIAGNOSIS — Z961 Presence of intraocular lens: Secondary | ICD-10-CM | POA: Diagnosis not present

## 2019-11-21 ENCOUNTER — Other Ambulatory Visit: Payer: Self-pay

## 2019-11-21 ENCOUNTER — Encounter: Payer: Self-pay | Admitting: Neurology

## 2019-11-21 ENCOUNTER — Ambulatory Visit (INDEPENDENT_AMBULATORY_CARE_PROVIDER_SITE_OTHER): Payer: Medicare Other | Admitting: Neurology

## 2019-11-21 VITALS — BP 134/84 | HR 66 | Ht 73.0 in | Wt 191.0 lb

## 2019-11-21 DIAGNOSIS — F0280 Dementia in other diseases classified elsewhere without behavioral disturbance: Secondary | ICD-10-CM

## 2019-11-21 DIAGNOSIS — G20 Parkinson's disease: Secondary | ICD-10-CM

## 2019-11-21 DIAGNOSIS — G4752 REM sleep behavior disorder: Secondary | ICD-10-CM

## 2019-11-21 DIAGNOSIS — R443 Hallucinations, unspecified: Secondary | ICD-10-CM

## 2019-11-21 DIAGNOSIS — Z9181 History of falling: Secondary | ICD-10-CM | POA: Diagnosis not present

## 2019-11-21 MED ORDER — CLONAZEPAM 0.5 MG PO TBDP: 0.5000 mg | ORAL_TABLET | Freq: Every day | ORAL | 1 refills | Status: DC

## 2019-11-21 NOTE — Patient Instructions (Signed)
Keeping you safe and preventing falls will be of utmost importance.  Please use your wheelchair as directed and follow recommendations from your home health therapists.  We will revisit home health PT, OT and ST later on this year, please call us to request another referral or have your home health agency reach out to last.  We will continue your medications at the current doses.  Please follow-up in about 4 months.

## 2019-11-21 NOTE — Progress Notes (Signed)
Subjective:    [name] ID: Brian [name] is a 79 y.o. male.  HPI      Interim history:  Brian [name] is a 79 year old right-handed gentleman with an underlying medical history of atrial fibrillation, hypertension, OSA, arthritis with bilateral total knee arthroplasties in 2016 and 2017 respectively, and hyperlipidemia who presents for for follow-up consultation of his advanced Parkinson's disease, complicated by memory loss, recurrent falls, arthritis, status post bilateral knee replacement surgeries, bladder hyperactivity, hallucinations.  Brian [name] is accompanied by his [name] again today. I last saw him on 08/17/2019, at which time his [name] requested a virtual visit as Brian [name] was too lethargic and she was unable to bring him to Brian office. His [name] reported recurrent falls.  Brian [name] was not able to use his walker safely by himself.  Brian [name] had home health therapy.  Brian [name] also had additional help at home.  His memory has become worse.  Brian [name] still had ongoing hallucinations.  Brian [name] was supposed to be on Exelon 4.5 mg twice daily but for some reason for about 2 months, Brian [name] had been switched back to 1.5 mg twice daily.  I corrected Brian prescription.  Brian [name] required assistance with all ADLs.    Today, 11/21/2019: His [name] provides his history.  She reports that Brian [name] has fallen several times, typically when Brian [name] tries to transfer, sometimes Brian [name] just forgets to use his wheelchair Brian way Brian [name] was explained, she believes that this has to do with his memory loss also.  Brian [name] sleeps on Brian main level, they have remodeled his bathroom to accommodate a different commode, grab bars and a shower that allows him in a wheelchair to be we wheeled in, if need be.  Brian [name] has had home health therapy through [facility], finishing up with physical therapy.  She would like to get him reevaluated later this year if possible.  Brian [name] had to be hospitalized in June due to altered mental status and being unresponsive, Brian [name] had hypoglycemia, hypotension and bradycardia, was  treated symptomatically with IV fluids and IV glucose.  Brian [name] was transferred to skilled nursing at [facility].  Brian [name] lost weight.  His appetite is better at home.  I reviewed Brian hospital records.  Brian [name] had a head CT without contrast on 09/08/2019 and I reviewed Brian results: IMPRESSION: 1. No CT evidence for acute intracranial abnormality. 2. Atrophy and chronic small vessel ischemic changes of Brian white matter.  Brian [name]'s allergies, current medications, family history, past medical history, past social history, past surgical history and problem list were reviewed and updated as appropriate.    Previously (copied from previous notes for reference):    I saw him on 06/15/2019, at which time his [name] reported that Nuplazid was not affordable, we tried in Brian interim to get them some support through Brian [employer] but it was not doable.  She was advised to continue with his medications.     I saw him on 02/09/2019, at which time his [name] reported that his memory loss was worse and his hallucinations were ongoing.  Brian [name] had more decline in his mobility.  I suggested we increase his Exelon to 4.5 mg bid.    His [name] called in Brian interim requesting a prescription for Nuplazid.  We also sent another referral to geriatric psychiatry and Brian [name] was referred to [facility] psychiatry.    I saw him in a virtual visit on 07/06/2018, at which time I suggested we increase his Sinemet to 2 pills 6 times a day.  Brian [name] was advised to continue with Sinemet CR at bedtime.    In Brian interim, I ordered Nuplazid for his hallucinations but it was too expensive.  I made a referral to geriatric psychiatry.  Brian office Brian [name] was referred to declined Brian referral.     I saw him on 04/22/2018, at which time Brian [name] was noted to be more slower and more unsteady. I suggested we increase his Sinemet to from 2 pills 4 times a day to 2 pills 5 times a day. I also suggested we increase his Exelon from 1.5 mg twice a day to 3 mg twice a  day.   His [name] called in Brian interim in March 2020, reporting that Brian [name] was much more lethargic. She was advised to take him to primary care to get checked out for acute medical problems such as urinary tract infection, dehydration, electrolyte disturbance, upper respiratory infection etc.   I saw him on 12/21/2017, at which time Brian [name] reported having fallen secondary to right leg giving out. Brian [name] was having more difficulty walking. Brian [name] was noted to be more forgetful by his [name]. MMSE was 21 at Brian time and I suggested we start him on Exelon. We kept his other medications Brian same.    I saw him on 08/10/2013, at which time Brian [name] was having physical therapy and occupational therapy at home. Brian [name] had recently been discharged from inpatient rehabilitation at [facility]. Brian [name] had triceps tendon repair on Brian right side in February 2019. I suggested we keep his prescriptions as same including Sinemet, Sinemet CR, Lexapro and clonazepam. I prescribed a U step walker.    I saw him on 05/18/2017, at which time Brian [name] reported a recent fall with injury to right elbow. Brian [name] also bumped his head. Brian [name] fell in Brian bathroom. Brian [name] went to Brian emergency room and I reviewed records at Brian time. Brian [name] had a head CT without contrast which showed no acute process, moderate volume loss, moderate chronic small vessel disease. Brian [name] needed surgery to his right arm under orthopedics.   I saw him on 01/12/2017, at which time Brian [name] reported to recent falls. Brian [name] was trying to work out on his own and was supposed to start working with a Physiological scientist.     I saw him on 07/09/2016, at which time Brian [name] was fairly stable.     I saw him on 03/06/2016 at which time Brian [name] reported doing better, Brian [name] had some residual right knee pain. Brian [name] had an appointment with his orthopedic surgeon. Brian [name] reported one fall that led to an ER visit via EMS. Brian [name] also had an admission to Brian hospital in October 2017 secondary to nausea, vomiting, urinary retention. Brian [name] was noted to have altered  mental status and acute kidney injury. Brian [name] had a head CT without contrast on 12/31/2015 which showed:    1.  No acute intracranial abnormality. 2. Mild increase in generalized atrophy from 2013. Chronic small vessel ischemia is stable.   Brian [name] was in Brian hospital for 4 days, then at [facility] in rehabilitation for about 10 days. Brian [name] has ongoing issues with particularly right leg stiffness, no additional falls, does not appear to be very motivated to exercise. Brian [name] had been in Brian [employer] boxing class. I suggested we continue with Sinemet 2 pills 4 times a day, Sinemet CR at bedtime, gabapentin, clonazepam for RBD at night and Brian only change was an increase in Lexapro from 5 mg to 10 mg daily.   I saw him on 10/22/2015,  at which time Brian [name] reported worse mobility, more stiffness in Brian right leg, more freezing episodes, difficulty getting out of bed especially at night when Brian [name] would have to go to Brian bathroom. His [name] would have to help him. Thankfully, Brian [name] had no recent falls and was using a cane. Brian [name] had finished physical therapy was not using his walker. Brian [name] was not exercising very much to. Brian [name] was more sedentary. Brian [name] had right total knee replacement on 07/12/2015. I suggested we increase his Sinemet to 2 pills 4 times a day. We kept his gabapentin Brian same. We kept Brian Sinemet CR at bedtime Brian same.   I saw him on 04/26/2015, at which time Brian [name] reported taking carbidopa/levodopa 25-100 milligrams strength, about 7 pills per day, 2 for Brian 1st dose, 1 1/2 pills for Brian 2nd and 4 th dose, 2 for Brian 3rd dose, and 1 CR at night. Brian [name] finished physical therapy and was able to walk without a cane. Brian [name] reported right knee pain and indicated that Brian [name] may need right knee surgery as well.   I saw him on 01/18/2015, at which time Brian [name] reported doing better slowly. Brian [name] had finished home health physical therapy. Brian [name] was supposed to start outpatient physical therapy through orthopedics. Brian [name] had left total knee arthroplasty on 11/30/2014. I  reviewed Brian operative note as well as his discharge summary. Brian [name] was discharged on 12/03/2014 to inpatient rehabilitation. Of note, Brian [name] presented to Brian emergency room on 12/16/2014 secondary to repeated vomiting. This was deemed secondary to constipation. Cardiac enzymes were tested which were negative for any acute coronary syndrome. I reviewed Brian emergency room records. Brian [name] had blood work which I reviewed. INR was 2.59. Hemoglobin was slightly low at 11.9 and hematocrit was 34.6. Troponin was negative 2. Brian [name] overall felt a little better since we increased his Sinemet. Brian [name] was taking a long-acting Sinemet CR at night. Brian [name] had some off time in Brian middle of Brian night.  Brian [name] felt Brian [name] was making slow progress after his knee replacement surgery. Brian [name] had some issues with edema. Brian [name] was wearing compression stockings for this. Brian [name] was also placed on furosemide for a little while. Brian [name] was no longer on narcotic pain medications. Brian [name] was using a cane at Brian time. I suggested we continue with his medication regimen, Brian [name] was alternating Sinemet 2 pills with 1-1/2 pills for a total of 4 doses a day.   Brian [name] missed an appointment on 12/19/2014. I saw him on 08/22/2014, at which time Brian [name] reported bilateral knee pain. Brian [name] had seen Dr. Tonita Cong for his knee arthritis and had undergone injections into both knees. Brian [name] did not think Brian injections helped a lot. Brian [name] was exploring knee replacement surgery. Brian [name] was having more difficulty getting out of chairs. Brian [name] had no freezing. Brian [name] did think that Brian increase in Sinemet had helped in Brian past. Brian [name] was alternating 1 pill with 1-1/2 pills. Brian [name] was taking Sinemet CR at night. Brian [name] was on gabapentin 100 mg 3 times a day and Lexapro 5 mg daily as well as clonazepam 0.5 mg each night. Brian [name] had no new memory or mood issues. Brian [name] was overall moving slower. Brian [name] had no recent falls.   In Brian interim, his [name] called on 01/04/2015 reported that Brian [name] had worsening symptoms. I suggested we increase his Sinemet slightly to 2  tablets alternating with 1-1/2 tablets.   I saw him on 06/01/2014, at which time Brian [name] reported overall doing well. Brian [name] was not able to sleep through  Brian night. Brian [name] was consistently waking up between 3 and 4 AM. Brian [name] had nocturia twice on average. Brian [name] felt improved from Brian bladder medication but still reported disrupted sleep. Brian [name] felt clonazepam has helped. His [name] agreed. Brian [name] also felt that Brian increase in Sinemet was helpful in his motor function. It was difficult for him to keep up with Brian 5 pills a day schedule. Brian [name] was going to [facility] 3 times a week for about 30 minutes. Lexapro has helped his mood. His [name] felt that Brian [name] was less anxious. I suggested we increase his clonazepam to 0.5 mg each night. I suggested we change his Sinemet to 1 pill alternating with one half pills for a total of 5 pills daily but for doses. I suggested Brian [name] continue with Sinemet CR at bedtime.   I saw him on 03/27/2014, at which time Brian [name] reported sleeping a little bit better but his tremor was worse. Brian [name] did not feel Sinemet was lasting him 4 hours in between 2 different doses. Brian [name] was going to Brian gym twice a week. Brian [name] was back on an antidepressant. Brian [name] was supposed to start a new bladder medication. Brian [name] never actually started clonazepam for RBD. I prescribed clonazepam for him for RBD. I increased his Sinemet to one fill 5 times a day.   I saw him on 12/01/2013, at which time Brian [name] reported, that Brian [name] stopped Myrbetric due to new onset facial swelling, but this was no better after stopping it. His facial swelling may have started after Brian lexapro. Adding long-acting Sinemet helped him sleep a little bit longer up to 3 AM. I suggested Brian [name] stop Lexapro because of a possible allergic reaction to it. I also added low-dose clonazepam for his RBD and for his sleep.   I saw him on 09/29/2013 at which time Brian [name] presented for a sooner than scheduled appointment because of increasing tremors and parkinsonian symptoms. Brian [name] also felt more anxious but not  frankly depressed. His [name] felt that Brian [name] may have been depressed. She reported dream enactments. She also reported loud snoring. Brian [name] had a sleep study over 5 years ago, and that was negative for OSA at Brian time. Brian [name] had started Myrbetriq recently. Brian [name] has been taking gabapentin 3 times a day, and 7, 11 and 3. Brian [name] takes Sinemet regularly at 7, 11, 3, and 7. I suggested that Brian [name] start low-dose Lexapro at 5 mg strength. I changed Brian timing of his gabapentin to one pill at 7 AM, 1 pill at 11 AM and one pill at 7 PM. I asked him to continue with Sinemet 4 times a day. I asked him to add a long-acting Sinemet CR 50/200 mg strength once daily at bed time. I suggested Brian [name] return for a sleep study. Brian [name] had a diagnostic polysomnogram on 11/09/2013 and I went over his test results with him in detail today. Sleep efficiency was reduced at 58.7% with a latency to sleep of 82 minutes and wake after sleep onset of 117 minutes with mild to moderate sleep fragmentation noted. Brian [name] had an increased percentage of light stage sleep, absence of deep sleep and 16.8% of dream sleep with a prolonged REM latency. Brian [name] had mild to moderate snoring. Total AHI was 3.2 per hour, Brian [name] had some lack of REM atonia.   I saw him on 05/31/2013, at which time I felt Brian [name] was fairly stable. I kept him on Sinemet 4 times a day and gabapentin 100 mg 3 times a day. Brian [name] reported, going up  to 4 times a day with his Sinemet helped. Brian [name] reported no side effects with gabapentin or Sinemet and seemed to tolerate them well. Brian [name] had some residual facial tingling which was tolerable to him. Brian [name] has LBP and went to [facility]. Brian [name] had an Xray of Brian back and was told there was degenerative disease and was given and was given a 3 week taper of oral steroids, which helped. Brian [name] had stopped exercising d/t back pain and was scheduled for Brian [name] evaluation through ortho. Brian [name] has been on metamucil for constipation which helped.   I saw him on 11/30/2012, at which time I suggested that Brian [name]  continue gabapentin 100 mg 1 pill 3 times a day for his paresthesias and encouraged him to continue Sinemet with 25/100 mg strength one tablet 4 times a day, at 7, 11, 3 PM and 7 PM.     I first met him on 06/22/2012 and Brian [name] presented on 08/23/2012 for a sooner than scheduled appointment because Brian [name] felt his medication was not helpful. Brian [name] previously followed with Dr. Morene Antu and had been complaining of paresthesias. Dr. Erling Cruz had started him on low-dose gabapentin. For his parkinsonism which was first noted in June of 2012 Brian [name] was tried on pramipexole which helped, but at his first visit with me Brian [name] told me that his gabapentin was not helpful and Brian [name] also discontinued pramipexole a few months prior because of swelling in his feet and ankles. This improved after stopping both medications. At Brian time of his first visit with me I felt Brian [name] had mild parkinsonism without much in Brian way of lateralization. I started him on gabapentin again because of his paresthesias and also asked him to start low-dose Sinemet. Brian [name] called back stating that Brian Sinemet was not helpful. Brian [name] requested a sooner appointment. Brian [name] had been continuing taking gabapentin 100 mg 3 times a day and Sinemet one pill 3 times a day with minimal improvement as Brian [name] reported last time. I suggested at that visit that Brian [name] increase his Sinemet to one pill 4 times a day and continue with gabapentin 100 mg 3 times a day. I also felt that Brian [name] may have right sided predominant idiopathic Parkinson's disease due to an intermittent tremor noted only on Brian right side.      His Past Medical History Is Significant For: Past Medical History:  Diagnosis Date  . Abnormal PFT 1. 05/18/08  2. 11/30/08   1. Showed mild airflow obstruction, mild restriction, mild diffusion defect; FEV1 2.22(64%), FVC 3.33(65%), FEVi% 67, TLC 5.19(69%), DLCO 77%, +BD  2. FEV1 2.38(73%), FVC 3.81(80%), FEV1% 63, TLC 5.61(80%), DLCO 79%, no BD  . Allergic rhinitis   . Anxiety   . BPH (benign  prostatic hyperplasia)   . Cellulitis    right leg MRSA  . Erectile dysfunction   . GERD (gastroesophageal reflux disease)   . Heart murmur    mild MR by echo  . Hyperlipidemia   . Hypertension   . Osteoarthritis   . Paresthesias 06/22/2012  . Parkinsonism ([place]) 06/22/2012  . Permanent atrial fibrillation ([place])   . Restrictive lung disease     His Past Surgical History Is Significant For: Past Surgical History:  Procedure Laterality Date  . CARDIAC CATHETERIZATION  11/2009   normal coronary arteries  . CARDIOVERSION     multiple  . CATARACT EXTRACTION    . COLONOSCOPY    . COLONOSCOPY WITH PROPOFOL N/A 09/02/2016   Procedure: COLONOSCOPY WITH PROPOFOL;  Surgeon: Earle Gell  K, MD;  Location: [facility];  Service: Endoscopy;  Laterality: N/A;  . EYE SURGERY     bilateral cataract extraction  . JOINT REPLACEMENT    . TOTAL KNEE ARTHROPLASTY Left 11/30/2014   Procedure: TOTAL KNEE ARTHROPLASTY;  Surgeon: Susa Day, MD;  Location: [facility];  Service: Orthopedics;  Laterality: Left;  . TOTAL KNEE ARTHROPLASTY Right 07/12/2015   Procedure: RIGHT TOTAL KNEE ARTHROPLASTY;  Surgeon: Susa Day, MD;  Location: [facility];  Service: Orthopedics;  Laterality: Right;  . TRICEPS TENDON REPAIR Right 05/20/2017  . TRICEPS TENDON REPAIR Right 05/20/2017   Procedure: TRICEPS TENDON REPAIR;  Surgeon: Iran Planas, MD;  Location: [facility];  Service: Orthopedics;  Laterality: Right;    His Family History Is Significant For: Family History  Problem Relation Age of Onset  . Diabetes Mother        DM  . Stroke Father        CVA  . Pancreatic cancer Brother   . Pancreatic cancer Other        Nephew    His Social History Is Significant For: Social History   Socioeconomic History  . Marital status: Married    Spouse name: [name]  . Number of children: 4  . Years of education: masters  . Highest education level: Not on file  Occupational History  . Occupation: Chief of Staff     Comment: Worked with [employer]  Tobacco Use  . Smoking status: Never Smoker  . Smokeless tobacco: Never Used  Vaping Use  . Vaping Use: Never used  Substance and Sexual Activity  . Alcohol use: No    Alcohol/week: 0.0 standard drinks  . Drug use: No  . Sexual activity: Not on file  Other Topics Concern  . Not on file  Social History Narrative   Married and lives in [place].  Retired Optometrist.   No reported caffeine use.    Social Determinants of Health   Financial Resource Strain:   . Difficulty of Paying Living Expenses: Not on file  Food Insecurity:   . Worried About Charity fundraiser in Brian Last Year: Not on file  . Ran Out of Food in Brian Last Year: Not on file  Transportation Needs:   . Lack of Transportation (Medical): Not on file  . Lack of Transportation (Non-Medical): Not on file  Physical Activity:   . Days of Exercise per Week: Not on file  . Minutes of Exercise per Session: Not on file  Stress:   . Feeling of Stress : Not on file  Social Connections:   . Frequency of Communication with Friends and Family: Not on file  . Frequency of Social Gatherings with Friends and Family: Not on file  . Attends Religious Services: Not on file  . Active Member of Clubs or Organizations: Not on file  . Attends Archivist Meetings: Not on file  . Marital Status: Not on file    His Allergies Are:  Allergies  Allergen Reactions  . Finasteride Other (See Comments)    More frequent urination.  . Lisinopril Cough  :   His Current Medications Are:  Outpatient Encounter Medications as of 11/21/2019  Medication Sig  . apixaban (ELIQUIS) 5 MG TABS tablet Take 1 tablet (5 mg total) by mouth 2 (two) times daily.  . carbidopa-levodopa (SINEMET CR) 50-200 MG tablet Take 2 tablets by mouth at bedtime.  . carbidopa-levodopa (SINEMET IR) 25-100 MG tablet 2 pills 6 times a day, starting at  7 AM, every 2 1/2 hours ([name] taking differently: Take 2 tablets by mouth  See admin instructions. Takes 2 tablets by mouth 6 times a day, starting at 7 AM, every 2 1/2 hours.)  . cholecalciferol (VITAMIN D) 1000 units tablet Take 1,000 Units by mouth daily.   . clonazePAM (KLONOPIN) 0.5 MG disintegrating tablet Take 1 tablet (0.5 mg total) by mouth at bedtime.  Marland Kitchen escitalopram (LEXAPRO) 10 MG tablet Take 1 tablet (10 mg total) by mouth at bedtime.  . famotidine (PEPCID) 40 MG tablet Take 40 mg by mouth daily.   Marland Kitchen losartan (COZAAR) 25 MG tablet Take 1 tablet (25 mg total) by mouth every morning.  . Multiple Vitamin (MULTIVITAMIN WITH MINERALS) TABS tablet Take 1 tablet by mouth daily.   . pravastatin (PRAVACHOL) 40 MG tablet Take 40 mg by mouth daily.   . psyllium (METAMUCIL) 58.6 % packet Take 1 packet by mouth daily.  . rivastigmine (EXELON) 4.5 MG capsule Take 1 capsule (4.5 mg total) by mouth 2 (two) times daily.  . tamsulosin (FLOMAX) 0.4 MG CAPS capsule Take 0.4 mg at bedtime by mouth.  . vitamin C (ASCORBIC ACID) 500 MG tablet Take 500 mg by mouth daily.    No facility-administered encounter medications on file as of 11/21/2019.  :  Review of Systems:  Out of a complete 14 point review of systems, all are reviewed and negative with Brian exception of these symptoms as listed below:  Review of Systems  Neurological:       Here for 3 month f/u. Brian [name]'s [name] reports decline since last visit. She sts Brian Brian [name] has fallen 5 times since last visit. Memory is Brian same. Brian [name] is tolerating meds well.     Objective:  Neurological Exam  Physical Exam Physical Examination:   Vitals:   11/21/19 1344  BP: 134/84  Pulse: 66  SpO2: 96%    General Examination: Brian [name] is a very pleasant 79 y.o. male in no acute distress.  Brian [name] is in his wheelchair.  Brian [name] appears deconditioned.  Well-groomed.   HEENT:Normocephalic, atraumatic, pupils are equal, round and reactive to light,s/p cataract repairs. Extraocular tracking shows moderatesaccadic breakdown.Moderate to severe  nuclear rigidity is noted with decrease in passive ROM. No lip, neck or jaw tremor is noted.Brian [name] has mod hypophonia,milddysarthria noted.More anterocollis.Oropharynx examination reveals no new findings, except moderate mouth dryness. Tongue is central, palate symmetrical.Mild sialorrhea, all stable.  Chest:Clear to auscultation without wheezing, rhonchi or crackles noted.  Heart:S1+S2+0, regular and normal without murmurs, rubs or gallops noted.   Abdomen:Soft, non-tender and non-distended.  Extremities:There isnoedema in Brian distal lower extremities bilaterally.   Skin: Warm and dry without trophic changes noted.  Musculoskeletal: exam reveals no obvious joint deformities, tenderness or joint swelling or erythema, well healedscars from TKAs b/l.no obvious swelling, no pain, slight decrease inleg extension range of motion bilaterally.   Neurologically:  Mental status: Brian [name] is awake, alert and pays attention. Hisimmediate and remote memory, attention, language skills and fund of knowledge areimpaired with some slowness in thinking.Mood is normaland affect is normal.  On9/30/2019: MMSE: 21/28, CDT: could not draw. AFT: 11/min.  On1/30/2020: MMSE: 23/28, cannot draw, CDT: Cannot draw a clock, AFT: 8/min.  On11/18/2020: MMSE: 18/30,Cannot draw, cannot draw clock,AFT: 5/min.  On 06/15/2019: MMSE: 17/30, CDT: 1/4, AFT: 10/min.  On 11/21/2019: MMSE: 23/30, AFT: 7/min.  Cranial nerves II - XII are as described above under HEENT exam. Motor exam: Normal bulk,globalstrengthof 4/5, No restingtremor or rebound. Romberg is not  tested for safetyreasons.Reflexes arenot tested today.Fine motor exam shows severe impairment on Brian right, moderate to severe impairment on Brian left. Moderate tosevere slowness noted today. No obvious resting tremor. Cerebellar testing: No dysmetria or intention tremor.  Sensory exam: intact to light touch in Brian upper and  lower extremities.  Gait, station and balance:I did not have him stand or walk for me today, as Brian [name] is in a wheelchair.  Assessment and Plan:   In summary, Brian [name] a very pleasant 79 year old male with a history of atrial fibrillation, hypertension, hyperlipidemia, who presents for followup consultation of his right-sided predominant,Parkinson's disease, akinetic-rigid type, complicated byRecurrent falls, memory loss, anxiety, depression, sleep disturbance, arthritis with status post bilateral knee replacement surgeries constipation and hallucinations. Nuplazid was not affordable unfortunately. Brian [name] has had decline in his mobility over time and has fallen.  Brian [name] has had worsening memory loss and hallucinations. Brian [name] has been able to tolerate Exelon 4.5 mg twice daily.  Brian [name] had a recent hospitalization in June due to altered mental status, in Brian context of hypoglycemia and hypotension.  Brian [name] has been on home health therapy, we will reevaluate Brian need for additional home health therapy towards Brian end of Brian year.  Brian [name] is advised to continue his medications including Sinemet, Sinemet CR, clonazepam, Lexapro, and Exelon.  Brian [name] has been off of gabapentin.  We talked about Brian importance of supportive treatments and fall prevention. I answered all Brian questions today and Brian [name] were in agreement. I spent 30 minutes in total face-to-face time and in reviewing records during pre-charting, more than 50% of which was spent in counseling and coordination of care, reviewing test results, reviewing medications and treatment regimen and/or in discussing or reviewing Brian diagnosis of PD, Brian prognosis and treatment options. Pertinent laboratory and imaging test results that were available during this visit with Brian [name] were reviewed by me and considered in my medical decision making (see chart for details).

## 2019-11-22 DIAGNOSIS — D631 Anemia in chronic kidney disease: Secondary | ICD-10-CM | POA: Diagnosis not present

## 2019-11-22 DIAGNOSIS — I4821 Permanent atrial fibrillation: Secondary | ICD-10-CM | POA: Diagnosis not present

## 2019-11-22 DIAGNOSIS — I1310 Hypertensive heart and chronic kidney disease without heart failure, with stage 1 through stage 4 chronic kidney disease, or unspecified chronic kidney disease: Secondary | ICD-10-CM | POA: Diagnosis not present

## 2019-11-22 DIAGNOSIS — N182 Chronic kidney disease, stage 2 (mild): Secondary | ICD-10-CM | POA: Diagnosis not present

## 2019-11-22 DIAGNOSIS — F0281 Dementia in other diseases classified elsewhere with behavioral disturbance: Secondary | ICD-10-CM | POA: Diagnosis not present

## 2019-11-22 DIAGNOSIS — G3183 Dementia with Lewy bodies: Secondary | ICD-10-CM | POA: Diagnosis not present

## 2019-11-24 ENCOUNTER — Telehealth: Payer: Self-pay | Admitting: Neurology

## 2019-11-24 DIAGNOSIS — I4821 Permanent atrial fibrillation: Secondary | ICD-10-CM | POA: Diagnosis not present

## 2019-11-24 DIAGNOSIS — D631 Anemia in chronic kidney disease: Secondary | ICD-10-CM | POA: Diagnosis not present

## 2019-11-24 DIAGNOSIS — G3183 Dementia with Lewy bodies: Secondary | ICD-10-CM | POA: Diagnosis not present

## 2019-11-24 DIAGNOSIS — F0281 Dementia in other diseases classified elsewhere with behavioral disturbance: Secondary | ICD-10-CM | POA: Diagnosis not present

## 2019-11-24 DIAGNOSIS — N182 Chronic kidney disease, stage 2 (mild): Secondary | ICD-10-CM | POA: Diagnosis not present

## 2019-11-24 DIAGNOSIS — I1310 Hypertensive heart and chronic kidney disease without heart failure, with stage 1 through stage 4 chronic kidney disease, or unspecified chronic kidney disease: Secondary | ICD-10-CM | POA: Diagnosis not present

## 2019-11-24 NOTE — Telephone Encounter (Signed)
Thank you for the notification. I am glad, he was not hurt.

## 2019-11-24 NOTE — Telephone Encounter (Signed)
Dionka @ [facility] called to report a fall pt had last Thursday.  According to wife pt attempted to get up from his wheelchair to go to the bathroom.  The chair was not locked.  Pt was not hurt and there is no bruising from the fall.  This is FYI, no call back requested by Dionka.

## 2019-11-28 DIAGNOSIS — I4821 Permanent atrial fibrillation: Secondary | ICD-10-CM | POA: Diagnosis not present

## 2020-01-05 ENCOUNTER — Telehealth: Payer: Self-pay | Admitting: Neurology

## 2020-01-05 ENCOUNTER — Encounter: Payer: Self-pay | Admitting: Neurology

## 2020-01-05 DIAGNOSIS — G4752 REM sleep behavior disorder: Secondary | ICD-10-CM

## 2020-01-05 MED ORDER — CLONAZEPAM 0.5 MG PO TBDP: 1.0000 mg | ORAL_TABLET | Freq: Every day | ORAL | 0 refills | Status: DC

## 2020-01-05 NOTE — Telephone Encounter (Signed)
Please call or email Brian [name], [name]'s wife with regards to his sleep concerns.  I would like to avoid adding any new medications at this time and trazodone may cause him side effects.  I would like to see if we can increase his clonazepam to 1 mg each bedtime.  He is currently on 0.5 mg and can double up on the clonazepam for total dose of 1 mg each night.  I have adjusted the prescription in this regard.  I hope this helps, please encourage her to keep [place] posted.

## 2020-01-05 NOTE — Telephone Encounter (Signed)
I have reached out to the pt's wife and advised of Dr. Guadelupe Sabin recommendation via my chart. Advised her to message back if need be.

## 2020-02-09 DIAGNOSIS — F0390 Unspecified dementia without behavioral disturbance: Secondary | ICD-10-CM | POA: Diagnosis not present

## 2020-02-09 DIAGNOSIS — E782 Mixed hyperlipidemia: Secondary | ICD-10-CM | POA: Diagnosis not present

## 2020-02-09 DIAGNOSIS — N182 Chronic kidney disease, stage 2 (mild): Secondary | ICD-10-CM | POA: Diagnosis not present

## 2020-02-09 DIAGNOSIS — G20 Parkinson's disease: Secondary | ICD-10-CM | POA: Diagnosis not present

## 2020-02-09 DIAGNOSIS — I519 Heart disease, unspecified: Secondary | ICD-10-CM | POA: Diagnosis not present

## 2020-02-09 DIAGNOSIS — Z23 Encounter for immunization: Secondary | ICD-10-CM | POA: Diagnosis not present

## 2020-02-09 DIAGNOSIS — N400 Enlarged prostate without lower urinary tract symptoms: Secondary | ICD-10-CM | POA: Diagnosis not present

## 2020-02-09 DIAGNOSIS — R269 Unspecified abnormalities of gait and mobility: Secondary | ICD-10-CM | POA: Diagnosis not present

## 2020-02-09 DIAGNOSIS — I10 Essential (primary) hypertension: Secondary | ICD-10-CM | POA: Diagnosis not present

## 2020-02-09 DIAGNOSIS — K219 Gastro-esophageal reflux disease without esophagitis: Secondary | ICD-10-CM | POA: Diagnosis not present

## 2020-02-09 DIAGNOSIS — I482 Chronic atrial fibrillation, unspecified: Secondary | ICD-10-CM | POA: Diagnosis not present

## 2020-02-14 ENCOUNTER — Ambulatory Visit: Payer: Medicare Other | Admitting: Podiatry

## 2020-03-07 ENCOUNTER — Other Ambulatory Visit: Payer: Self-pay

## 2020-03-07 ENCOUNTER — Ambulatory Visit (INDEPENDENT_AMBULATORY_CARE_PROVIDER_SITE_OTHER): Payer: Medicare Other | Admitting: Podiatry

## 2020-03-07 ENCOUNTER — Encounter: Payer: Self-pay | Admitting: Podiatry

## 2020-03-07 DIAGNOSIS — B351 Tinea unguium: Secondary | ICD-10-CM

## 2020-03-07 DIAGNOSIS — D689 Coagulation defect, unspecified: Secondary | ICD-10-CM | POA: Diagnosis not present

## 2020-03-07 DIAGNOSIS — N182 Chronic kidney disease, stage 2 (mild): Secondary | ICD-10-CM

## 2020-03-07 DIAGNOSIS — M79676 Pain in unspecified toe(s): Secondary | ICD-10-CM

## 2020-03-07 NOTE — Progress Notes (Signed)
This patient returns to my office for at risk foot care.  This patient requires this care by a professional since this patient will be at risk due to having coagulation defect.  Patient is taking eliquis. .  He presents to the office with his wife   Patient is in a wheelchair .  This patient is unable to cut nails himself since the patient cannot reach his nails.These nails are painful walking and wearing shoes.  This patient presents for at risk foot care today.  General Appearance  Alert, conversant and in no acute stress.  Vascular  Dorsalis pedis and posterior tibial  pulses are absent   bilaterally.  Capillary return is within normal limits  Bilaterally. Cold feet.  Purplish feet  B/L.  Neurologic  Senn-Weinstein monofilament wire test within normal limits  bilaterally. Muscle power within normal limits bilaterally.  Nails Thick disfigured discolored nails with subungual debris  from hallux to fifth toes bilaterally. No evidence of bacterial infection or drainage bilaterally.  Orthopedic  No limitations of motion  feet .  No crepitus or effusions noted.  No bony pathology or digital deformities noted.  Skin  Thin shiny  skin with no porokeratosis noted bilaterally.  No signs of infections or ulcers noted.     Onychomycosis  Pain in right toes  Pain in left toes  Consent was obtained for treatment procedures.   Mechanical debridement of nails 1-5  bilaterally performed with a nail nipper.  Filed with dremel without incident.    Return office visit    3 months                  Told patient to return for periodic foot care and evaluation due to potential at risk complications.   Shawanna Zanders DPM  

## 2020-03-22 ENCOUNTER — Ambulatory Visit: Payer: Medicare Other | Admitting: Neurology

## 2020-03-26 ENCOUNTER — Encounter: Payer: Self-pay | Admitting: Neurology

## 2020-03-26 ENCOUNTER — Telehealth: Payer: Self-pay

## 2020-03-26 ENCOUNTER — Telehealth (INDEPENDENT_AMBULATORY_CARE_PROVIDER_SITE_OTHER): Payer: Medicare Other | Admitting: Neurology

## 2020-03-26 DIAGNOSIS — G20 Parkinson's disease: Secondary | ICD-10-CM | POA: Diagnosis not present

## 2020-03-26 DIAGNOSIS — F419 Anxiety disorder, unspecified: Secondary | ICD-10-CM | POA: Diagnosis not present

## 2020-03-26 DIAGNOSIS — G4752 REM sleep behavior disorder: Secondary | ICD-10-CM

## 2020-03-26 DIAGNOSIS — F418 Other specified anxiety disorders: Secondary | ICD-10-CM

## 2020-03-26 MED ORDER — ESCITALOPRAM OXALATE 10 MG PO TABS: 10.0000 mg | ORAL_TABLET | Freq: Every day | ORAL | 3 refills | Status: DC

## 2020-03-26 MED ORDER — RIVASTIGMINE 9.5 MG/24HR TD PT24: 9.5000 mg | MEDICATED_PATCH | Freq: Every day | TRANSDERMAL | 12 refills | Status: DC

## 2020-03-26 MED ORDER — CARBIDOPA-LEVODOPA 25-100 MG PO TABS: ORAL_TABLET | ORAL | 3 refills | Status: DC

## 2020-03-26 MED ORDER — CLONAZEPAM 0.5 MG PO TBDP: 1.0000 mg | ORAL_TABLET | Freq: Every day | ORAL | 1 refills | Status: DC

## 2020-03-26 MED ORDER — CARBIDOPA-LEVODOPA ER 50-200 MG PO TBCR: 2.0000 | EXTENDED_RELEASE_TABLET | Freq: Every day | ORAL | 3 refills | Status: DC

## 2020-03-26 NOTE — Patient Instructions (Signed)
Given verbally, during today's virtual video-based encounter, with verbal feedback received.   

## 2020-03-26 NOTE — Telephone Encounter (Signed)
I called pt's wife, Sheffield Slider [place].  Pt understands that although there may be some limitations with this type of visit, we will take all precautions to reduce any security or privacy concerns.  Pt understands that this will be treated like an in office visit and we will file with pt's insurance, and there may be a patient responsible charge related to this service.  Pt's meds, allergies, and PMH were updated.

## 2020-03-26 NOTE — Progress Notes (Signed)
Interim history:   Brian [name] is a 80 year old right-handed gentleman with an underlying medical history of atrial fibrillation, hypertension, OSA, arthritis with bilateral total knee arthroplasties in 2016 and 2017 respectively, and hyperlipidemia who presents for a virtual visit today for follow-up consultation of his Parkinson's disease.  He is accompanied by his wife today and they join from home, I am located in my office.  I last saw him on 11/21/2019, at which time his wife reported that he had had several falls in the interim.  He had physical therapy through home health.  He had to be hospitalized in June due to altered mental status and being unresponsive, he had hypoglycemia, hypotension and bradycardia, was treated symptomatically with IV fluids and IV glucose.  He was transferred to skilled nursing at [facility].  He lost weight.  His appetite was a little better at home.  He had a head CT without contrast on 09/08/2019 and I reviewed the results: IMPRESSION: 1. No CT evidence for acute intracranial abnormality. 2. Atrophy and chronic small vessel ischemic changes of the white matter.   Today, 03/26/2020: He reports very little on his own.  His history is provided by his wife.  He is able to answer simple questions with repeated prompting.  He is having trouble swallowing the Exelon capsules.  Sometimes he treats the capsule but it causes him to throw up at times.  His Sinemet typically starts at 11 AM as he wakes up late.  He takes 2 pills every 2 hours approximately, for a total of 6 doses, 2 pills each time.  He takes 2 pills of the CR between 7 and 9 PM.  He takes clonazepam 0.5 mg strength 2 pills at bedtime.  It may have helped a little bit.  His wife had reached out in October reporting that he was having difficulty with sleep.  I increase the clonazepam from 1 pill to 2 pills at the time.  They have home health aides through [place], 5 days a week, 4 hours at a time.  This helps.  He has  not had any recent falls thankfully.   The patient's allergies, current medications, family history, past medical history, past social history, past surgical history and problem list were reviewed and updated as appropriate.    Previously (copied from previous notes for reference):    I saw him on 08/17/2019, at which time his wife requested a virtual visit as he was too lethargic and she was unable to bring him to the office. His wife reported recurrent falls.  He was not able to use his walker safely by himself.  He had home health therapy.  He also had additional help at home.  His memory has become worse.  He still had ongoing hallucinations.  He was supposed to be on Exelon 4.5 mg twice daily but for some reason for about 2 months, he had been switched back to 1.5 mg twice daily.  I corrected the prescription.  He required assistance with all ADLs.         I saw him on 06/15/2019, at which time his wife reported that Nuplazid was not affordable, we tried in the interim to get them some support through the [employer] but it was not doable.  She was advised to continue with his medications.     I saw him on 02/09/2019, at which time his wife reported that his memory loss was worse and his hallucinations were ongoing.  He had  more decline in his mobility.  I suggested we increase his Exelon to 4.5 mg bid.    His wife called in the interim requesting a prescription for Nuplazid.  We also sent another referral to geriatric psychiatry and he was referred to [facility] psychiatry.    I saw him in a virtual visit on 07/06/2018, at which time I suggested we increase his Sinemet to 2 pills 6 times a day.  He was advised to continue with Sinemet CR at bedtime.    In the interim, I ordered Nuplazid for his hallucinations but it was too expensive.  I made a referral to geriatric psychiatry.  The office he was referred to declined the referral.     I saw him on 04/22/2018, at which time he was noted  to be more slower and more unsteady. I suggested we increase his Sinemet to from 2 pills 4 times a day to 2 pills 5 times a day. I also suggested we increase his Exelon from 1.5 mg twice a day to 3 mg twice a day.   His wife called in the interim in March 2020, reporting that he was much more lethargic. She was advised to take him to primary care to get checked out for acute medical problems such as urinary tract infection, dehydration, electrolyte disturbance, upper respiratory infection etc.   I saw him on 12/21/2017, at which time he reported having fallen secondary to right leg giving out. He was having more difficulty walking. He was noted to be more forgetful by his wife. MMSE was 21 at the time and I suggested we start him on Exelon. We kept his other medications the same.    I saw him on 08/10/2013, at which time he was having physical therapy and occupational therapy at home. He had recently been discharged from inpatient rehabilitation at [facility]. He had triceps tendon repair on the right side in February 2019. I suggested we keep his prescriptions as same including Sinemet, Sinemet CR, Lexapro and clonazepam. I prescribed a U step walker.    I saw him on 05/18/2017, at which time he reported a recent fall with injury to right elbow. He also bumped his head. He fell in the bathroom. He went to the emergency room and I reviewed records at the time. He had a head CT without contrast which showed no acute process, moderate volume loss, moderate chronic small vessel disease. He needed surgery to his right arm under orthopedics.   I saw him on 01/12/2017, at which time he reported to recent falls. He was trying to work out on his own and was supposed to start working with a Physiological scientist.     I saw him on 07/09/2016, at which time he was fairly stable.     I saw him on 03/06/2016 at which time he reported doing better, he had some residual right knee pain. He had an appointment with his  orthopedic surgeon. He reported one fall that led to an ER visit via EMS. He also had an admission to the hospital in October 2017 secondary to nausea, vomiting, urinary retention. He was noted to have altered mental status and acute kidney injury. He had a head CT without contrast on 12/31/2015 which showed:    1.  No acute intracranial abnormality. 2. Mild increase in generalized atrophy from 2013. Chronic small vessel ischemia is stable.   He was in the hospital for 4 days, then at [facility] in rehabilitation for about 10  days. He has ongoing issues with particularly right leg stiffness, no additional falls, does not appear to be very motivated to exercise. He had been in the [employer] boxing class. I suggested we continue with Sinemet 2 pills 4 times a day, Sinemet CR at bedtime, gabapentin, clonazepam for RBD at night and the only change was an increase in Lexapro from 5 mg to 10 mg daily.   I saw him on 10/22/2015, at which time he reported worse mobility, more stiffness in the right leg, more freezing episodes, difficulty getting out of bed especially at night when he would have to go to the bathroom. His wife would have to help him. Thankfully, he had no recent falls and was using a cane. He had finished physical therapy was not using his walker. He was not exercising very much to. He was more sedentary. He had right total knee replacement on 07/12/2015. I suggested we increase his Sinemet to 2 pills 4 times a day. We kept his gabapentin the same. We kept the Sinemet CR at bedtime the same.   I saw him on 04/26/2015, at which time he reported taking carbidopa/levodopa 25-100 milligrams strength, about 7 pills per day, 2 for the 1st dose, 1 1/2 pills for the 2nd and 4 th dose, 2 for the 3rd dose, and 1 CR at night. He finished physical therapy and was able to walk without a cane. He reported right knee pain and indicated that he may need right knee surgery as well.   I saw him on 01/18/2015, at which  time he reported doing better slowly. He had finished home health physical therapy. He was supposed to start outpatient physical therapy through orthopedics. He had left total knee arthroplasty on 11/30/2014. I reviewed the operative note as well as his discharge summary. He was discharged on 12/03/2014 to inpatient rehabilitation. Of note, he presented to the emergency room on 12/16/2014 secondary to repeated vomiting. This was deemed secondary to constipation. Cardiac enzymes were tested which were negative for any acute coronary syndrome. I reviewed the emergency room records. He had blood work which I reviewed. INR was 2.59. Hemoglobin was slightly low at 11.9 and hematocrit was 34.6. Troponin was negative 2. He overall felt a little better since we increased his Sinemet. He was taking a long-acting Sinemet CR at night. He had some off time in the middle of the night.  He felt he was making slow progress after his knee replacement surgery. He had some issues with edema. He was wearing compression stockings for this. He was also placed on furosemide for a little while. He was no longer on narcotic pain medications. He was using a cane at the time. I suggested we continue with his medication regimen, he was alternating Sinemet 2 pills with 1-1/2 pills for a total of 4 doses a day.   He missed an appointment on 12/19/2014. I saw him on 08/22/2014, at which time he reported bilateral knee pain. He had seen Dr. Tonita Cong for his knee arthritis and had undergone injections into both knees. He did not think the injections helped a lot. He was exploring knee replacement surgery. He was having more difficulty getting out of chairs. He had no freezing. He did think that the increase in Sinemet had helped in the past. He was alternating 1 pill with 1-1/2 pills. He was taking Sinemet CR at night. He was on gabapentin 100 mg 3 times a day and Lexapro 5 mg daily as well as clonazepam  0.5 mg each night. He had no new memory  or mood issues. He was overall moving slower. He had no recent falls.   In the interim, his wife called on 01/04/2015 reported that he had worsening symptoms. I suggested we increase his Sinemet slightly to 2 tablets alternating with 1-1/2 tablets.   I saw him on 06/01/2014, at which time he reported overall doing well. He was not able to sleep through the night. He was consistently waking up between 3 and 4 AM. He had nocturia twice on average. He felt improved from the bladder medication but still reported disrupted sleep. He felt clonazepam has helped. His wife agreed. He also felt that the increase in Sinemet was helpful in his motor function. It was difficult for him to keep up with the 5 pills a day schedule. He was going to the [facility] 3 times a week for about 30 minutes. Lexapro has helped his mood. His wife felt that he was less anxious. I suggested we increase his clonazepam to 0.5 mg each night. I suggested we change his Sinemet to 1 pill alternating with one half pills for a total of 5 pills daily but for doses. I suggested he continue with Sinemet CR at bedtime.   I saw him on 03/27/2014, at which time he reported sleeping a little bit better but his tremor was worse. He did not feel Sinemet was lasting him 4 hours in between 2 different doses. He was going to the gym twice a week. He was back on an antidepressant. He was supposed to start a new bladder medication. He never actually started clonazepam for RBD. I prescribed clonazepam for him for RBD. I increased his Sinemet to one fill 5 times a day.   I saw him on 12/01/2013, at which time he reported, that he stopped Myrbetric due to new onset facial swelling, but this was no better after stopping it. His facial swelling may have started after the lexapro. Adding long-acting Sinemet helped him sleep a little bit longer up to 3 AM. I suggested he stop Lexapro because of a possible allergic reaction to it. I also added low-dose clonazepam for his  RBD and for his sleep.   I saw him on 09/29/2013 at which time he presented for a sooner than scheduled appointment because of increasing tremors and parkinsonian symptoms. He also felt more anxious but not frankly depressed. His wife felt that he may have been depressed. She reported dream enactments. She also reported loud snoring. He had a sleep study over 5 years ago, and that was negative for OSA at the time. He had started Myrbetriq recently. He has been taking gabapentin 3 times a day, and 7, 11 and 3. He takes Sinemet regularly at 7, 11, 3, and 7. I suggested that he start low-dose Lexapro at 5 mg strength. I changed the timing of his gabapentin to one pill at 7 AM, 1 pill at 11 AM and one pill at 7 PM. I asked him to continue with Sinemet 4 times a day. I asked him to add a long-acting Sinemet CR 50/200 mg strength once daily at bed time. I suggested he return for a sleep study. He had a diagnostic polysomnogram on 11/09/2013 and I went over his test results with him in detail today. Sleep efficiency was reduced at 58.7% with a latency to sleep of 82 minutes and wake after sleep onset of 117 minutes with mild to moderate sleep fragmentation noted. He had an increased  percentage of light stage sleep, absence of deep sleep and 16.8% of dream sleep with a prolonged REM latency. He had mild to moderate snoring. Total AHI was 3.2 per hour, he had some lack of REM atonia.   I saw him on 05/31/2013, at which time I felt he was fairly stable. I kept him on Sinemet 4 times a day and gabapentin 100 mg 3 times a day. He reported, going up to 4 times a day with his Sinemet helped. He reported no side effects with gabapentin or Sinemet and seemed to tolerate them well. He had some residual facial tingling which was tolerable to him. He has LBP and went to [facility]. He had an Xray of the back and was told there was degenerative disease and was given and was given a 3 week taper of oral steroids, which helped. He  had stopped exercising d/t back pain and was scheduled for PT evaluation through ortho. He has been on metamucil for constipation which helped.   I saw him on 11/30/2012, at which time I suggested that he continue gabapentin 100 mg 1 pill 3 times a day for his paresthesias and encouraged him to continue Sinemet with 25/100 mg strength one tablet 4 times a day, at 7, 11, 3 PM and 7 PM.     I first met him on 06/22/2012 and he presented on 08/23/2012 for a sooner than scheduled appointment because he felt his medication was not helpful. He previously followed with Dr. Morene Antu and had been complaining of paresthesias. Dr. Erling Cruz had started him on low-dose gabapentin. For his parkinsonism which was first noted in June of 2012 he was tried on pramipexole which helped, but at his first visit with me he told me that his gabapentin was not helpful and he also discontinued pramipexole a few months prior because of swelling in his feet and ankles. This improved after stopping both medications. At the time of his first visit with me I felt he had mild parkinsonism without much in the way of lateralization. I started him on gabapentin again because of his paresthesias and also asked him to start low-dose Sinemet. He called back stating that the Sinemet was not helpful. He requested a sooner appointment. He had been continuing taking gabapentin 100 mg 3 times a day and Sinemet one pill 3 times a day with minimal improvement as he reported last time. I suggested at that visit that he increase his Sinemet to one pill 4 times a day and continue with gabapentin 100 mg 3 times a day. I also felt that he may have right sided predominant idiopathic Parkinson's disease due to an intermittent tremor noted only on the right side.     His Past Medical History Is Significant For: Past Medical History:  Diagnosis Date  . Abnormal PFT 1. 05/18/08  2. 11/30/08   1. Showed mild airflow obstruction, mild restriction, mild diffusion  defect; FEV1 2.22(64%), FVC 3.33(65%), FEVi% 67, TLC 5.19(69%), DLCO 77%, +BD  2. FEV1 2.38(73%), FVC 3.81(80%), FEV1% 63, TLC 5.61(80%), DLCO 79%, no BD  . Allergic rhinitis   . Anxiety   . BPH (benign prostatic hyperplasia)   . Cellulitis    right leg MRSA  . Erectile dysfunction   . GERD (gastroesophageal reflux disease)   . Heart murmur    mild MR by echo  . Hyperlipidemia   . Hypertension   . Osteoarthritis   . Paresthesias 06/22/2012  . Parkinsonism ([place]) 06/22/2012  . Permanent  atrial fibrillation ([place])   . Restrictive lung disease     His Past Surgical History Is Significant For: Past Surgical History:  Procedure Laterality Date  . CARDIAC CATHETERIZATION  11/2009   normal coronary arteries  . CARDIOVERSION     multiple  . CATARACT EXTRACTION    . COLONOSCOPY    . COLONOSCOPY WITH PROPOFOL N/A 09/02/2016   Procedure: COLONOSCOPY WITH PROPOFOL;  Surgeon: Garlan Fair, MD;  Location: [facility] END[facility];  Service: Endoscopy;  Laterality: N/A;  . EYE SURGERY     bilateral cataract extraction  . JOINT REPLACEMENT    . TOTAL KNEE ARTHROPLASTY Left 11/30/2014   Procedure: TOTAL KNEE ARTHROPLASTY;  Surgeon: Susa Day, MD;  Location: [facility];  Service: Orthopedics;  Laterality: Left;  . TOTAL KNEE ARTHROPLASTY Right 07/12/2015   Procedure: RIGHT TOTAL KNEE ARTHROPLASTY;  Surgeon: Susa Day, MD;  Location: [facility];  Service: Orthopedics;  Laterality: Right;  . TRICEPS TENDON REPAIR Right 05/20/2017  . TRICEPS TENDON REPAIR Right 05/20/2017   Procedure: TRICEPS TENDON REPAIR;  Surgeon: Iran Planas, MD;  Location: [facility];  Service: Orthopedics;  Laterality: Right;    His Family History Is Significant For: Family History  Problem Relation Age of Onset  . Diabetes Mother        DM  . Stroke Father        CVA  . Pancreatic cancer Brother   . Pancreatic cancer Other        Nephew    His Social History Is Significant For: Social History   Socioeconomic History  . Marital  status: Married    Spouse name: [name]  . Number of children: 4  . Years of education: masters  . Highest education level: Not on file  Occupational History  . Occupation: Chief of Staff    Comment: Worked with [employer]  Tobacco Use  . Smoking status: Never Smoker  . Smokeless tobacco: Never Used  Vaping Use  . Vaping Use: Never used  Substance and Sexual Activity  . Alcohol use: No    Alcohol/week: 0.0 standard drinks  . Drug use: No  . Sexual activity: Not on file  Other Topics Concern  . Not on file  Social History Narrative   Married and lives in [place].  Retired Optometrist.   No reported caffeine use.    Social Determinants of Health   Financial Resource Strain: Not on file  Food Insecurity: Not on file  Transportation Needs: Not on file  Physical Activity: Not on file  Stress: Not on file  Social Connections: Not on file    His Allergies Are:  Allergies  Allergen Reactions  . Finasteride Other (See Comments)    More frequent urination.  . Other Other (See Comments)  . Oxybutynin Other (See Comments)  . Lisinopril Cough and Other (See Comments)  :   His Current Medications Are:  Outpatient Encounter Medications as of 03/26/2020  Medication Sig  . apixaban (ELIQUIS) 5 MG TABS tablet Take 1 tablet (5 mg total) by mouth 2 (two) times daily.  . carbidopa-levodopa (SINEMET CR) 50-200 MG tablet Take 2 tablets by mouth at bedtime.  . carbidopa-levodopa (SINEMET IR) 25-100 MG tablet 2 pills 6 times a day, starting at 7 AM, every 2 1/2 hours (Patient taking differently: Take 2 tablets by mouth See admin instructions. Takes 2 tablets by mouth 6 times a day, starting at 7 AM, every 2 1/2 hours.)  . cholecalciferol (VITAMIN D) 1000 units tablet Take 1,000  Units by mouth daily.   . clonazePAM (KLONOPIN) 0.5 MG disintegrating tablet Take 2 tablets (1 mg total) by mouth at bedtime.  Marland Kitchen escitalopram (LEXAPRO) 10 MG tablet Take 1 tablet (10 mg total) by mouth at  bedtime.  . famotidine (PEPCID) 40 MG tablet Take 40 mg by mouth daily.   Marland Kitchen losartan (COZAAR) 25 MG tablet Take 1 tablet (25 mg total) by mouth every morning.  . Multiple Vitamin (MULTIVITAMIN WITH MINERALS) TABS tablet Take 1 tablet by mouth daily.   . pravastatin (PRAVACHOL) 40 MG tablet Take 40 mg by mouth daily.   . psyllium (METAMUCIL) 58.6 % packet Take 1 packet by mouth daily.  . rivastigmine (EXELON) 4.5 MG capsule Take 1 capsule (4.5 mg total) by mouth 2 (two) times daily.  . tamsulosin (FLOMAX) 0.4 MG CAPS capsule Take 0.4 mg at bedtime by mouth.  . vitamin C (ASCORBIC ACID) 500 MG tablet Take 500 mg by mouth daily.    No facility-administered encounter medications on file as of 03/26/2020.  :  Review of Systems:  Out of a complete 14 point review of systems, all are reviewed and negative with the exception of these symptoms as listed below:  Virtual Visit via Video Note on 03/26/20  I connected with Usher and his wife on 03/26/20 at  1:00 PM EST by a video enabled telemedicine application and verified that I am speaking with the correct person using two identifiers.   I discussed the limitations of evaluation and management by telemedicine and the availability of in person appointments. The patient expressed understanding and agreed to proceed.  History of Present Illness:  See above  Observations/Objective: The patient is situated on the couch.  He has a stooped posture, slight tilt to the upper body.  Moderate facial masking noted, significant hypophonia and dysarthria noted.  He is able to answer some simple questions with prompting.  Assessment and Plan: In summary, THERMON ZULAUF a very pleasant 80 year old male with a history of atrial fibrillation, hypertension, hyperlipidemia, who presents for a virtual visit for followup consultation of his right-sided predominant,Parkinson's disease, akinetic-rigid type, complicated byrecurrent falls, memory loss, anxiety,  depression, sleep disturbance, arthritis with status post bilateral knee replacement surgeriesconstipation andhallucinations. Nuplazid was not affordable, unfortunately.He has had decline in his mobility over time and has fallen repeatedly.  He has had worsening memory loss and hallucinations.He has been able to tolerate Exelon 4.5 mg twice daily, but lately has had trouble swallowing that.  Sometimes he chews the capsule which results in pocketing of the medication also occasional vomiting.  I suggested we switch him to the Exelon patch 9.5 mg daily.  They are agreeable.  He had a hospitalization in June 2021 for altered mental status, in the context of hypoglycemia and hypotension.  He has been on home health therapy, and currently has home health aides, 4 hours daily, 5 days a week.  [place] is [place].  He increase the clonazepam to 1 mg at bedtime in October 2021.  He is advised to continue his medications including Sinemet, Sinemet CR, clonazepam, Lexapro, and Exelon, which we are switching to the patch form at this time.  He has been off of gabapentin.  We talked about the importance of supportive treatments and fall prevention.  He is advised to follow-up in the office in about 3 months, sooner if needed.  I answered all their questions today and the patient and his wife were in agreement.    Follow  Up Instructions:   I discussed the assessment and treatment plan with the patient. The patient was provided an opportunity to ask questions and all were answered. The patient agreed with the plan and demonstrated an understanding of the instructions.   The patient was advised to call back or seek an in-person evaluation if the symptoms worsen or if the condition fails to improve as anticipated.  I provided 30 minutes of non-face-to-face time during this encounter.   Star Age, MD

## 2020-05-22 ENCOUNTER — Encounter: Payer: Self-pay | Admitting: Neurology

## 2020-05-23 MED ORDER — RIVASTIGMINE 9.5 MG/24HR TD PT24: 9.5000 mg | MEDICATED_PATCH | Freq: Every day | TRANSDERMAL | 2 refills | Status: DC

## 2020-06-20 ENCOUNTER — Encounter: Payer: Self-pay | Admitting: Podiatry

## 2020-06-20 ENCOUNTER — Ambulatory Visit (INDEPENDENT_AMBULATORY_CARE_PROVIDER_SITE_OTHER): Payer: Medicare Other | Admitting: Podiatry

## 2020-06-20 ENCOUNTER — Other Ambulatory Visit: Payer: Self-pay

## 2020-06-20 DIAGNOSIS — M79676 Pain in unspecified toe(s): Secondary | ICD-10-CM

## 2020-06-20 DIAGNOSIS — B351 Tinea unguium: Secondary | ICD-10-CM

## 2020-06-20 NOTE — Progress Notes (Signed)
This patient returns to my office for at risk foot care.  This patient requires this care by a professional since this patient will be at risk due to having coagulation defect.  Patient is taking eliquis. Marland Kitchen  He presents to the office with his wife   Patient is in a wheelchair .  This patient is unable to cut nails himself since the patient cannot reach his nails.These nails are painful walking and wearing shoes.  This patient presents for at risk foot care today.  General Appearance  Alert, conversant and in no acute stress.  Vascular  Dorsalis pedis and posterior tibial  pulses are absent   bilaterally.  Capillary return is within normal limits  Bilaterally. Cold feet.  Purplish feet  B/L.  Neurologic  Senn-Weinstein monofilament wire test within normal limits  bilaterally. Muscle power within normal limits bilaterally.  Nails Thick disfigured discolored nails with subungual debris  from hallux to fifth toes bilaterally. No evidence of bacterial infection or drainage bilaterally.  Orthopedic  No limitations of motion  feet .  No crepitus or effusions noted.  No bony pathology or digital deformities noted.  Skin  Thin shiny  skin with no porokeratosis noted bilaterally.  No signs of infections or ulcers noted.     Onychomycosis  Pain in right toes  Pain in left toes  Consent was obtained for treatment procedures.   Mechanical debridement of nails 1-5  bilaterally performed with a nail nipper.  Filed with dremel without incident.    Return office visit    3 months                  Told patient to return for periodic foot care and evaluation due to potential at risk complications.   Gardiner Barefoot DPM

## 2020-07-04 ENCOUNTER — Encounter: Payer: Self-pay | Admitting: Neurology

## 2020-07-04 ENCOUNTER — Other Ambulatory Visit: Payer: Self-pay

## 2020-07-04 ENCOUNTER — Ambulatory Visit (INDEPENDENT_AMBULATORY_CARE_PROVIDER_SITE_OTHER): Payer: Medicare Other | Admitting: Neurology

## 2020-07-04 VITALS — BP 126/84 | HR 66

## 2020-07-04 DIAGNOSIS — G4752 REM sleep behavior disorder: Secondary | ICD-10-CM

## 2020-07-04 DIAGNOSIS — G20 Parkinson's disease: Secondary | ICD-10-CM

## 2020-07-04 DIAGNOSIS — R443 Hallucinations, unspecified: Secondary | ICD-10-CM

## 2020-07-04 DIAGNOSIS — R269 Unspecified abnormalities of gait and mobility: Secondary | ICD-10-CM | POA: Diagnosis not present

## 2020-07-04 DIAGNOSIS — F0280 Dementia in other diseases classified elsewhere without behavioral disturbance: Secondary | ICD-10-CM

## 2020-07-04 DIAGNOSIS — F419 Anxiety disorder, unspecified: Secondary | ICD-10-CM | POA: Diagnosis not present

## 2020-07-04 DIAGNOSIS — Z9181 History of falling: Secondary | ICD-10-CM

## 2020-07-04 DIAGNOSIS — R5381 Other malaise: Secondary | ICD-10-CM

## 2020-07-04 MED ORDER — CLONAZEPAM 1 MG PO TBDP: 1.0000 mg | ORAL_TABLET | Freq: Every day | ORAL | 5 refills | Status: DC

## 2020-07-04 NOTE — Progress Notes (Signed)
Subjective:    Patient ID: Brian Brian [name] is a 80 y.o. male.  HPI     Interim history:   Brian Brian [name] is a 80 year old right-handed gentleman with an underlying medical history of atrial fibrillation, hypertension, OSA, arthritis with bilateral total knee arthroplasties in 2016 and 2017 respectively, and hyperlipidemia who presents for follow-up consultation of his Parkinson's disease, complicated by recurrent falls, memory loss, anxiety, depression, sleep disturbance, arthritis with status post bilateral knee replacement surgeries constipation and hallucinations. He is accompanied by his wife today.  I last saw him in a virtual visit on 03/26/2020, at which time his wife reported that he was having trouble swallowing capsules, particularly the Exelon capsules.  We mutually agreed to switch him to the Exelon patch.  He was advised to continue with Sinemet, Sinemet CR, clonazepam, and Lexapro.  He had home health aide through [facility].   Today, 07/04/2020: He reports that there is nothing new to report.  He is minimally verbal.  He is in no acute distress and history is primarily provided by his wife.  She reports that he had 1 fall, he missed a step while transferring.  He did not hurt himself.  They did have to call EMS to help him get up.  He is more active at night, does not sleep, may have more visual hallucinations and delusions at night.  He often sees little children that are annoying.  He has been taking the clonazepam at night, Exelon patch is working okay and tolerated but he does have a skin reaction to the upper back and they tried it shifting locations but have primarily used the upper back.  She has not put anything on the Brian [name], he does report it is itchy.  He does not have any systemic Brian [name] or hives.  She would like to get a prescription for a shower chair as well as a reclining wheelchair as sometimes he does not want to go to bed and she does not want to have him sleep on an upright wheelchair.   She is looking at respite care.  She reports that their oldest granddaughter graduated from college and is moving to [place].  She would like to see her situated in [place] and would need some help with him during that time.  She does have a CNA come in for 4 hours daily starting at 11 AM.  Patient's wife gives him his first dose around 6 AM, she also lets him drink a boost milkshake.  The CNA comes at 34 and stays for 4 hours on Mondays through Fridays.  This system is working fairly well for now.  The patient's allergies, current medications, family history, past medical history, past social history, past surgical history and problem list were reviewed and updated as appropriate.    Previously (copied from previous notes for reference):   I saw him on 11/21/2019, at which time his wife reported that he had had several falls in the interim.  He had physical therapy through home health.  He had to be hospitalized in June due to altered mental status and being unresponsive, he had hypoglycemia, hypotension and bradycardia, was treated symptomatically with IV fluids and IV glucose.  He was transferred to skilled nursing at [facility].  He lost weight.  His appetite was a little better at home.  He had a head CT without contrast on 09/08/2019 and I reviewed the results: IMPRESSION: 1. No CT evidence for acute intracranial abnormality. 2. Atrophy and  chronic small vessel ischemic changes of the white matter.       I saw him on 08/17/2019, at which time his wife requested a virtual visit as he was too lethargic and she was unable to bring him to the office. His wife reported recurrent falls.  He was not able to use his walker safely by himself.  He had home health therapy.  He also had additional help at home.  His memory has become worse.  He still had ongoing hallucinations.  He was supposed to be on Exelon 4.5 mg twice daily but for some reason for about 2 months, he had been switched back to 1.5 mg  twice daily.  I corrected the prescription.  He required assistance with all ADLs.         I saw him on 06/15/2019, at which time his wife reported that Nuplazid was not affordable, we tried in the interim to get them some support through the [employer] but it was not doable.  She was advised to continue with his medications.     I saw him on 02/09/2019, at which time his wife reported that his memory loss was worse and his hallucinations were ongoing.  He had more decline in his mobility.  I suggested we increase his Exelon to 4.5 mg bid.    His wife called in the interim requesting a prescription for Nuplazid.  We also sent another referral to geriatric psychiatry and he was referred to [facility] psychiatry.    I saw him in a virtual visit on 07/06/2018, at which time I suggested we increase his Sinemet to 2 pills 6 times a day.  He was advised to continue with Sinemet CR at bedtime.    In the interim, I ordered Nuplazid for his hallucinations but it was too expensive.  I made a referral to geriatric psychiatry.  The office he was referred to declined the referral.     I saw him on 04/22/2018, at which time he was noted to be more slower and more unsteady. I suggested we increase his Sinemet to from 2 pills 4 times a day to 2 pills 5 times a day. I also suggested we increase his Exelon from 1.5 mg twice a day to 3 mg twice a day.   His wife called in the interim in March 2020, reporting that he was much more lethargic. She was advised to take him to primary care to get checked out for acute medical problems such as urinary tract infection, dehydration, electrolyte disturbance, upper respiratory infection etc.   I saw him on 12/21/2017, at which time he reported having fallen secondary to right leg giving out. He was having more difficulty walking. He was noted to be more forgetful by his wife. MMSE was 21 at the time and I suggested we start him on Exelon. We kept his other medications the  same.    I saw him on 08/10/2013, at which time he was having physical therapy and occupational therapy at home. He had recently been discharged from inpatient rehabilitation at [facility]. He had triceps tendon repair on the right side in February 2019. I suggested we keep his prescriptions as same including Sinemet, Sinemet CR, Lexapro and clonazepam. I prescribed a U step walker.    I saw him on 05/18/2017, at which time he reported a recent fall with injury to right elbow. He also bumped his head. He fell in the bathroom. He went to the emergency room  and I reviewed records at the time. He had a head CT without contrast which showed no acute process, moderate volume loss, moderate chronic small vessel disease. He needed surgery to his right arm under orthopedics.   I saw him on 01/12/2017, at which time he reported to recent falls. He was trying to work out on his own and was supposed to start working with a Physiological scientist.     I saw him on 07/09/2016, at which time he was fairly stable.     I saw him on 03/06/2016 at which time he reported doing better, he had some residual right knee pain. He had an appointment with his orthopedic surgeon. He reported one fall that led to an ER visit via EMS. He also had an admission to the hospital in October 2017 secondary to nausea, vomiting, urinary retention. He was noted to have altered mental status and acute kidney injury. He had a head CT without contrast on 12/31/2015 which showed:    1.  No acute intracranial abnormality. 2. Mild increase in generalized atrophy from 2013. Chronic small vessel ischemia is stable.   He was in the hospital for 4 days, then at [facility] in rehabilitation for about 10 days. He has ongoing issues with particularly right leg stiffness, no additional falls, does not appear to be very motivated to exercise. He had been in the [employer] boxing class. I suggested we continue with Sinemet 2 pills 4 times a day, Sinemet CR at  bedtime, gabapentin, clonazepam for RBD at night and the only change was an increase in Lexapro from 5 mg to 10 mg daily.   I saw him on 10/22/2015, at which time he reported worse mobility, more stiffness in the right leg, more freezing episodes, difficulty getting out of bed especially at night when he would have to go to the bathroom. His wife would have to help him. Thankfully, he had no recent falls and was using a cane. He had finished physical therapy was not using his walker. He was not exercising very much to. He was more sedentary. He had right total knee replacement on 07/12/2015. I suggested we increase his Sinemet to 2 pills 4 times a day. We kept his gabapentin the same. We kept the Sinemet CR at bedtime the same.   I saw him on 04/26/2015, at which time he reported taking carbidopa/levodopa 25-100 milligrams strength, about 7 pills per day, 2 for the 1st dose, 1 1/2 pills for the 2nd and 4 th dose, 2 for the 3rd dose, and 1 CR at night. He finished physical therapy and was able to walk without a cane. He reported right knee pain and indicated that he may need right knee surgery as well.   I saw him on 01/18/2015, at which time he reported doing better slowly. He had finished home health physical therapy. He was supposed to start outpatient physical therapy through orthopedics. He had left total knee arthroplasty on 11/30/2014. I reviewed the operative note as well as his discharge summary. He was discharged on 12/03/2014 to inpatient rehabilitation. Of note, he presented to the emergency room on 12/16/2014 secondary to repeated vomiting. This was deemed secondary to constipation. Cardiac enzymes were tested which were negative for any acute coronary syndrome. I reviewed the emergency room records. He had blood work which I reviewed. INR was 2.59. Hemoglobin was slightly low at 11.9 and hematocrit was 34.6. Troponin was negative 2. He overall felt a little better since we increased his  Sinemet.  He was taking a long-acting Sinemet CR at night. He had some off time in the middle of the night.  He felt he was making slow progress after his knee replacement surgery. He had some issues with edema. He was wearing compression stockings for this. He was also placed on furosemide for a little while. He was no longer on narcotic pain medications. He was using a cane at the time. I suggested we continue with his medication regimen, he was alternating Sinemet 2 pills with 1-1/2 pills for a total of 4 doses a day.   He missed an appointment on 12/19/2014. I saw him on 08/22/2014, at which time he reported bilateral knee pain. He had seen Dr. Tonita Cong for his knee arthritis and had undergone injections into both knees. He did not think the injections helped a lot. He was exploring knee replacement surgery. He was having more difficulty getting out of chairs. He had no freezing. He did think that the increase in Sinemet had helped in the past. He was alternating 1 pill with 1-1/2 pills. He was taking Sinemet CR at night. He was on gabapentin 100 mg 3 times a day and Lexapro 5 mg daily as well as clonazepam 0.5 mg each night. He had no new memory or mood issues. He was overall moving slower. He had no recent falls.   In the interim, his wife called on 01/04/2015 reported that he had worsening symptoms. I suggested we increase his Sinemet slightly to 2 tablets alternating with 1-1/2 tablets.   I saw him on 06/01/2014, at which time he reported overall doing well. He was not able to sleep through the night. He was consistently waking up between 3 and 4 AM. He had nocturia twice on average. He felt improved from the bladder medication but still reported disrupted sleep. He felt clonazepam has helped. His wife agreed. He also felt that the increase in Sinemet was helpful in his motor function. It was difficult for him to keep up with the 5 pills a day schedule. He was going to the [facility] 3 times a week for about 30 minutes.  Lexapro has helped his mood. His wife felt that he was less anxious. I suggested we increase his clonazepam to 0.5 mg each night. I suggested we change his Sinemet to 1 pill alternating with one half pills for a total of 5 pills daily but for doses. I suggested he continue with Sinemet CR at bedtime.   I saw him on 03/27/2014, at which time he reported sleeping a little bit better but his tremor was worse. He did not feel Sinemet was lasting him 4 hours in between 2 different doses. He was going to the gym twice a week. He was back on an antidepressant. He was supposed to start a new bladder medication. He never actually started clonazepam for RBD. I prescribed clonazepam for him for RBD. I increased his Sinemet to one fill 5 times a day.   I saw him on 12/01/2013, at which time he reported, that he stopped Myrbetric due to new onset facial swelling, but this was no better after stopping it. His facial swelling may have started after the lexapro. Adding long-acting Sinemet helped him sleep a little bit longer up to 3 AM. I suggested he stop Lexapro because of a possible allergic reaction to it. I also added low-dose clonazepam for his RBD and for his sleep.   I saw him on 09/29/2013 at which time he presented  for a sooner than scheduled appointment because of increasing tremors and parkinsonian symptoms. He also felt more anxious but not frankly depressed. His wife felt that he may have been depressed. She reported dream enactments. She also reported loud snoring. He had a sleep study over 5 years ago, and that was negative for OSA at the time. He had started Myrbetriq recently. He has been taking gabapentin 3 times a day, and 7, 11 and 3. He takes Sinemet regularly at 7, 11, 3, and 7. I suggested that he start low-dose Lexapro at 5 mg strength. I changed the timing of his gabapentin to one pill at 7 AM, 1 pill at 11 AM and one pill at 7 PM. I asked him to continue with Sinemet 4 times a day. I asked him to  add a long-acting Sinemet CR 50/200 mg strength once daily at bed time. I suggested he return for a sleep study. He had a diagnostic polysomnogram on 11/09/2013 and I went over his test results with him in detail today. Sleep efficiency was reduced at 58.7% with a latency to sleep of 82 minutes and wake after sleep onset of 117 minutes with mild to moderate sleep fragmentation noted. He had an increased percentage of light stage sleep, absence of deep sleep and 16.8% of dream sleep with a prolonged REM latency. He had mild to moderate snoring. Total AHI was 3.2 per hour, he had some lack of REM atonia.   I saw him on 05/31/2013, at which time I felt he was fairly stable. I kept him on Sinemet 4 times a day and gabapentin 100 mg 3 times a day. He reported, going up to 4 times a day with his Sinemet helped. He reported no side effects with gabapentin or Sinemet and seemed to tolerate them well. He had some residual facial tingling which was tolerable to him. He has LBP and went to [facility]. He had an Xray of the back and was told there was degenerative disease and was given and was given a 3 week taper of oral steroids, which helped. He had stopped exercising d/t back pain and was scheduled for PT evaluation through ortho. He has been on metamucil for constipation which helped.   I saw him on 11/30/2012, at which time I suggested that he continue gabapentin 100 mg 1 pill 3 times a day for his paresthesias and encouraged him to continue Sinemet with 25/100 mg strength one tablet 4 times a day, at 7, 11, 3 PM and 7 PM.     I first met him on 06/22/2012 and he presented on 08/23/2012 for a sooner than scheduled appointment because he felt his medication was not helpful. He previously followed with Dr. Morene Antu and had been complaining of paresthesias. Dr. Erling Cruz had started him on low-dose gabapentin. For his parkinsonism which was first noted in June of 2012 he was tried on pramipexole which helped, but at his  first visit with me he told me that his gabapentin was not helpful and he also discontinued pramipexole a few months prior because of swelling in his feet and ankles. This improved after stopping both medications. At the time of his first visit with me I felt he had mild parkinsonism without much in the way of lateralization. I started him on gabapentin again because of his paresthesias and also asked him to start low-dose Sinemet. He called back stating that the Sinemet was not helpful. He requested a sooner appointment. He had been continuing  taking gabapentin 100 mg 3 times a day and Sinemet one pill 3 times a day with minimal improvement as he reported last time. I suggested at that visit that he increase his Sinemet to one pill 4 times a day and continue with gabapentin 100 mg 3 times a day. I also felt that he may have right sided predominant idiopathic Parkinson's disease due to an intermittent tremor noted only on the right side.    His Past Medical History Is Significant For: Past Medical History:  Diagnosis Date  . Abnormal PFT 1. 05/18/08  2. 11/30/08   1. Showed mild airflow obstruction, mild restriction, mild diffusion defect; FEV1 2.22(64%), FVC 3.33(65%), FEVi% 67, TLC 5.19(69%), DLCO 77%, +BD  2. FEV1 2.38(73%), FVC 3.81(80%), FEV1% 63, TLC 5.61(80%), DLCO 79%, no BD  . Allergic rhinitis   . Anxiety   . BPH (benign prostatic hyperplasia)   . Cellulitis    right leg MRSA  . Erectile dysfunction   . GERD (gastroesophageal reflux disease)   . Heart murmur    mild MR by echo  . Hyperlipidemia   . Hypertension   . Osteoarthritis   . Paresthesias 06/22/2012  . Parkinsonism ([place]) 06/22/2012  . Permanent atrial fibrillation ([place])   . Restrictive lung disease     His Past Surgical History Is Significant For: Past Surgical History:  Procedure Laterality Date  . CARDIAC CATHETERIZATION  11/2009   normal coronary arteries  . CARDIOVERSION     multiple  . CATARACT EXTRACTION    .  COLONOSCOPY    . COLONOSCOPY WITH PROPOFOL N/A 09/02/2016   Procedure: COLONOSCOPY WITH PROPOFOL;  Surgeon: Garlan Fair, MD;  Location: [facility] END[facility]COPY;  Service: Endoscopy;  Laterality: N/A;  . EYE SURGERY     bilateral cataract extraction  . JOINT REPLACEMENT    . TOTAL KNEE ARTHROPLASTY Left 11/30/2014   Procedure: TOTAL KNEE ARTHROPLASTY;  Surgeon: Susa Day, MD;  Location: [facility];  Service: Orthopedics;  Laterality: Left;  . TOTAL KNEE ARTHROPLASTY Right 07/12/2015   Procedure: RIGHT TOTAL KNEE ARTHROPLASTY;  Surgeon: Susa Day, MD;  Location: [facility] ORS;  Service: Orthopedics;  Laterality: Right;  . TRICEPS TENDON REPAIR Right 05/20/2017  . TRICEPS TENDON REPAIR Right 05/20/2017   Procedure: TRICEPS TENDON REPAIR;  Surgeon: Iran Planas, MD;  Location: [place];  Service: Orthopedics;  Laterality: Right;    His Family History Is Significant For: Family History  Problem Relation Age of Onset  . Diabetes Mother        DM  . Stroke Father        CVA  . Pancreatic cancer Brother   . Pancreatic cancer Other        Nephew    His Social History Is Significant For: Social History   Socioeconomic History  . Marital status: Married    Spouse name: [name]  . Number of children: 4  . Years of education: masters  . Highest education level: Not on file  Occupational History  . Occupation: Chief of Staff    Comment: Worked with [employer]  Tobacco Use  . Smoking status: Never Smoker  . Smokeless tobacco: Never Used  Vaping Use  . Vaping Use: Never used  Substance and Sexual Activity  . Alcohol use: No    Alcohol/week: 0.0 standard drinks  . Drug use: No  . Sexual activity: Not on file  Other Topics Concern  . Not on file  Social History Narrative   Married and lives in [place].  Retired Optometrist.   No reported caffeine use.    Social Determinants of Health   Financial Resource Strain: Not on file  Food Insecurity: Not on file  Transportation Needs: Not on  file  Physical Activity: Not on file  Stress: Not on file  Social Connections: Not on file    His Allergies Are:  Allergies  Allergen Reactions  . Finasteride Other (See Comments)    More frequent urination.  . Other Other (See Comments)  . Oxybutynin Other (See Comments)  . Lisinopril Cough and Other (See Comments)  :   His Current Medications Are:  Outpatient Encounter Medications as of 07/04/2020  Medication Sig  . apixaban (ELIQUIS) 5 MG TABS tablet Take 1 tablet (5 mg total) by mouth 2 (two) times daily.  . carbidopa-levodopa (SINEMET CR) 50-200 MG tablet Take 2 tablets by mouth at bedtime.  . carbidopa-levodopa (SINEMET IR) 25-100 MG tablet 2 pills 6 times a day, starting at 11 AM, every 2 hours  . cholecalciferol (VITAMIN D) 1000 units tablet Take 1,000 Units by mouth daily.   . clonazePAM (KLONOPIN) 0.5 MG disintegrating tablet Take 2 tablets (1 mg total) by mouth at bedtime.  Marland Kitchen escitalopram (LEXAPRO) 10 MG tablet Take 1 tablet (10 mg total) by mouth at bedtime.  . famotidine (PEPCID) 40 MG tablet Take 40 mg by mouth daily.   Marland Kitchen losartan (COZAAR) 25 MG tablet Take 1 tablet (25 mg total) by mouth every morning.  . Multiple Vitamin (MULTIVITAMIN WITH MINERALS) TABS tablet Take 1 tablet by mouth daily.   . pravastatin (PRAVACHOL) 40 MG tablet Take 40 mg by mouth daily.   . psyllium (METAMUCIL) 58.6 % packet Take 1 packet by mouth daily.  . rivastigmine (EXELON) 9.5 mg/24hr Place 1 patch (9.5 mg total) onto the skin daily.  . tamsulosin (FLOMAX) 0.4 MG CAPS capsule Take 0.4 mg at bedtime by mouth.  . vitamin C (ASCORBIC ACID) 500 MG tablet Take 500 mg by mouth daily.    No facility-administered encounter medications on file as of 07/04/2020.  :  Review of Systems:  Out of a complete 14 point review of systems, all are reviewed and negative with the exception of these symptoms as listed below: Review of Systems  Neurological:       Here for 3 month f/u. Reports meds overall  are going well. Reports some skin irritation with the PD patch, wanted to discuss getting some Zinc cream. Wife reports Hallucinations are the same. 1 fall reported, no serious injuries.     Objective:  Neurological Exam  Physical Exam Physical Examination:   Vitals:   07/04/20 1308  BP: 126/84  Pulse: 66    General Examination: The patient is a very pleasant 80 y.o. male in no acute distress. He appears somewhat frail, well-groomed, in his wheelchair, with a gait belt.  HEENT:Normocephalic, atraumatic, pupils are equal, round and reactive to light,s/p cataract repairs. Extraocular tracking is impaired.  He has significant nuchal rigidity, decreased range of motion in the neck.  Moderate hypophonia and moderate dysarthria.  He has stable findings on airway examination.  Tongue protrudes centrally and palate elevates symmetrically.    Chest:Clear to auscultation without wheezing, rhonchi or crackles noted.  Heart:S1+S2+0, regular and normal without murmurs, rubs or gallops noted.   Abdomen:Soft, non-tender and non-distended.  Extremities:There isnoedema in the distal lower extremities bilaterally.   Skin: Warm and dry without trophic changes noted.  He does have a round red patch from his previous Exelon  patch location, multiple smaller well-circumscribed erythematous patches, no raised urticaria, no blisters, no widespread Brian [name] in the upper back area.  Musculoskeletal: exam reveals no obvious joint deformities.   Neurologically:  Mental status: The patient is awake, alert and pays attention. Hisimmediate and remote memory, attention, language skills and fund of knowledge areimpaired.  He is unable to provide his history. On9/30/2019: MMSE: 21/28, CDT: could not draw. AFT: 11/min.  On1/30/2020: MMSE: 23/28, cannot draw, CDT: Cannot draw a clock, AFT: 8/min.  On11/18/2020: MMSE: 18/30,Cannot draw, cannot draw clock,AFT: 5/min.  On3/24/2021: MMSE: 17/30,  CDT: 1/4, AFT: 10/min.  On 11/21/2019: MMSE: 23/30, AFT: 7/min.  Cranial nerves II - XII are as described above under HEENT exam. Motor exam: Normal bulk,globalstrengthof 4/5, No restingtremor. Romberg is not tested for safetyreasons.Reflexes arenot tested today.Fine motor exam shows moderate to severe impairment of all fine motor skills, more so on the right.  Cerebellar testing: No dysmetria or intention tremor.  Sensory exam: intact to light touch in the upper and lower extremities.  Gait, station and balance:I did not have him stand or walk for me today, as he is in a wheelchair.  Assessment and Plan:   In summary, Brian Brian [name] a very pleasant 80 year old male with a history of atrial fibrillation, hypertension, hyperlipidemia, who presents for followup consultation of his right-sided predominant,Parkinson's disease, akinetic-rigid type, complicated byRecurrent falls, memory loss, anxiety, depression, sleep disturbance, arthritis with status post bilateral knee replacement surgeriesconstipation andhallucinations. Nuplazid was not affordable unfortunately.He has had decline in his mobility over time and has fallen.  He has had worsening memory loss and hallucinations.He was on Exelon 4.5 mg twice daily but had trouble swallowing the capsules.  We switched him to the patch about 3 months ago after our video visit.  He tolerates the patch but does have a skin reaction to it.  They are advised to change the location and avoid the back for now and switch to other non hairy skin location such as upper inner arm, inner thigh, abdominal area and lower back.  I will switch the clonazepam to 1 mg strength some he does has to take 1 pill once daily at night.  They are encouraged to add some melatonin at night, 3 to 6 mg.  We will keep all his other medications the same.  I will write for a shower chair and reclining wheelchair.  He is advised to continue his medications including  Sinemet, Sinemet CR, clonazepam, Lexapro, and Exelon.  He has been off of gabapentin.  They have a CNA 4 hours a day during the workweek.  She is looking into respite care so she may be able to make a trip to [place] to see her oldest granddaughter settling.  I advised him to follow-up routinely in 4 months, sooner if needed.  I answered all the questions today and the patient and his wife were in agreement. I spent 35 minutes in total face-to-face time and in reviewing records during pre-charting, more than 50% of which was spent in counseling and coordination of care, reviewing test results, reviewing medications and treatment regimen and/or in discussing or reviewing the diagnosis of PD, the prognosis and treatment options. Pertinent laboratory and imaging test results that were available during this visit with the patient were reviewed by me and considered in my medical decision making (see chart for details).

## 2020-07-04 NOTE — Patient Instructions (Addendum)
It was good to see you both again today.  I am glad to hear that things are more or less stable.  I will prescribe a shower chair and reclining wheelchair, we will try to get this through by auto or [employer].  We will continue with your current medication regimen, I have only changed your clonazepam from 0.5 mg pills to 1 mg pill so you only have to take 1 at night.  I have requested that they do not send in a blister pack.  In addition, for your Exelon patch adhesive reaction, try some Benadryl cream and try to give the upper back a break.  You could use other locations such as upper inner arm, inner thigh, lower back and abdominal area to rotate more different areas for the patch.  You can also add some melatonin at night for additional help for sleep, 3 to 6 mg about an hour before bedtime.    We will have you follow-up in 4 months.  Please let us know if there is anything else we can do to help.  Congratulations on your oldest granddaughter graduating from college!

## 2020-07-06 ENCOUNTER — Encounter: Payer: Self-pay | Admitting: Neurology

## 2020-07-06 DIAGNOSIS — G4752 REM sleep behavior disorder: Secondary | ICD-10-CM

## 2020-07-09 MED ORDER — CLONAZEPAM 1 MG PO TBDP: 1.0000 mg | ORAL_TABLET | Freq: Every day | ORAL | 1 refills | Status: DC

## 2020-09-26 ENCOUNTER — Ambulatory Visit: Payer: Medicare Other | Admitting: Podiatry

## 2020-10-22 ENCOUNTER — Telehealth: Payer: Self-pay | Admitting: Neurology

## 2020-10-22 ENCOUNTER — Encounter: Payer: Self-pay | Admitting: *Deleted

## 2020-10-22 ENCOUNTER — Encounter: Payer: Self-pay | Admitting: Neurology

## 2020-10-22 NOTE — Telephone Encounter (Signed)
Letter signed, please notify patient's wife.

## 2020-10-22 NOTE — Telephone Encounter (Signed)
Letter written, to Dr. Rexene Alberts for review and signature.

## 2020-10-22 NOTE — Telephone Encounter (Signed)
Please furnish a letter of support as requested by his wife, see my chart message for reference.  I have followed patient in my clinic for years.  He has not been able to drive; he has a progressive underlying neurodegenerative condition for which there is no cure.  He is advised no longer to drive and I would support that he be removed from his current insurance policy.

## 2020-10-31 ENCOUNTER — Ambulatory Visit: Payer: Medicare Other | Admitting: Neurology

## 2020-11-05 ENCOUNTER — Encounter: Payer: Self-pay | Admitting: Neurology

## 2020-11-05 ENCOUNTER — Other Ambulatory Visit: Payer: Self-pay

## 2020-11-05 ENCOUNTER — Ambulatory Visit (INDEPENDENT_AMBULATORY_CARE_PROVIDER_SITE_OTHER): Payer: Medicare Other | Admitting: Neurology

## 2020-11-05 VITALS — BP 141/90 | HR 71 | Ht 73.0 in | Wt 200.0 lb

## 2020-11-05 DIAGNOSIS — G4752 REM sleep behavior disorder: Secondary | ICD-10-CM

## 2020-11-05 DIAGNOSIS — R443 Hallucinations, unspecified: Secondary | ICD-10-CM | POA: Diagnosis not present

## 2020-11-05 DIAGNOSIS — F418 Other specified anxiety disorders: Secondary | ICD-10-CM

## 2020-11-05 DIAGNOSIS — R5381 Other malaise: Secondary | ICD-10-CM

## 2020-11-05 DIAGNOSIS — F419 Anxiety disorder, unspecified: Secondary | ICD-10-CM

## 2020-11-05 DIAGNOSIS — F0280 Dementia in other diseases classified elsewhere without behavioral disturbance: Secondary | ICD-10-CM

## 2020-11-05 DIAGNOSIS — G20 Parkinson's disease: Secondary | ICD-10-CM

## 2020-11-05 MED ORDER — ESCITALOPRAM OXALATE 10 MG PO TABS: 10.0000 mg | ORAL_TABLET | Freq: Every day | ORAL | 3 refills | Status: AC

## 2020-11-05 MED ORDER — CLONAZEPAM 1 MG PO TBDP: 1.0000 mg | ORAL_TABLET | Freq: Every day | ORAL | 1 refills | Status: DC

## 2020-11-05 MED ORDER — RIVASTIGMINE 9.5 MG/24HR TD PT24: 9.5000 mg | MEDICATED_PATCH | Freq: Every day | TRANSDERMAL | 2 refills | Status: DC

## 2020-11-05 MED ORDER — CARBIDOPA-LEVODOPA 25-100 MG PO TABS: ORAL_TABLET | ORAL | 3 refills | Status: AC

## 2020-11-05 MED ORDER — CARBIDOPA-LEVODOPA ER 50-200 MG PO TBCR: 2.0000 | EXTENDED_RELEASE_TABLET | Freq: Every day | ORAL | 3 refills | Status: DC

## 2020-11-05 NOTE — Progress Notes (Signed)
Subjective:    Patient ID: Brian [name] is a 80 y.o. male.  HPI    Interim history:   Brian [name] is a 80 year old right-handed gentleman with an underlying medical history of atrial fibrillation, hypertension, OSA, arthritis with bilateral total knee arthroplasties in 2016 and 2017 respectively, and hyperlipidemia who presents for follow-up consultation of his Parkinson's disease, complicated by recurrent falls, memory loss, anxiety, depression, sleep disturbance, arthritis with status post bilateral knee replacement surgeries constipation and hallucinations. He is accompanied by his wife today.  I last saw him on 07/04/2020, at which time his wife reported that he had 1 fall.  Thankfully, he did not have any injuries.  He had fairly consistent visual hallucinations and more delusions at night.  He was able to tolerate Exelon patch.  They had constant care at home.     Today, 11/05/2020: He reports feeling okay.  His wife provides most of his history.  She reports things are stable.  He has not had any falls, he does not really walk any longer, he is mainly in the wheelchair with extra padding for support and an additional belt so he does not slide out when he falls asleep.  He eats 2 bigger meals per day, first meal around 11 and second meal around 6 PM.  He is not really able to feed himself well.  Appetite is good and he has not had any choking episodes, food is caught up in smaller pieces.  They do have caretakers throughout the day.  She reports that sometimes he does not sleep very well at night.  Sometimes he sleeps extended hours.  To use the walker to help transfer.  He does not use the walker to walk any longer.  She would welcome physical and Occupational Therapy through home health.  She does feel that his fingers tend to tighten up and curled in.  Constipation is under good control, sometimes he has loose stools.   The patient's allergies, current medications, family history, past medical  history, past social history, past surgical history and problem list were reviewed and updated as appropriate.    Previously (copied from previous notes for reference):    I saw him in a virtual visit on 03/26/2020, at which time his wife reported that he was having trouble swallowing capsules, particularly the Exelon capsules.  We mutually agreed to switch him to the Exelon patch.  He was advised to continue with Sinemet, Sinemet CR, clonazepam, and Lexapro.  He had home health aide through [place].      I saw him on 11/21/2019, at which time his wife reported that he had had several falls in the interim.  He had physical therapy through home health.  He had to be hospitalized in June due to altered mental status and being unresponsive, he had hypoglycemia, hypotension and bradycardia, was treated symptomatically with IV fluids and IV glucose.  He was transferred to skilled nursing at [facility].  He lost weight.  His appetite was a little better at home.  He had a head CT without contrast on 09/08/2019 and I reviewed the results: IMPRESSION: 1. No CT evidence for acute intracranial abnormality. 2. Atrophy and chronic small vessel ischemic changes of the white matter.       I saw him on 08/17/2019, at which time his wife requested a virtual visit as he was too lethargic and she was unable to bring him to the office. His wife reported recurrent falls.  He was not  able to use his walker safely by himself.  He had home health therapy.  He also had additional help at home.  His memory has become worse.  He still had ongoing hallucinations.  He was supposed to be on Exelon 4.5 mg twice daily but for some reason for about 2 months, he had been switched back to 1.5 mg twice daily.  I corrected the prescription.  He required assistance with all ADLs.         I saw him on 06/15/2019, at which time his wife reported that Nuplazid was not affordable, we tried in the interim to get them some support through the  [employer] but it was not doable.  She was advised to continue with his medications.     I saw him on 02/09/2019, at which time his wife reported that his memory loss was worse and his hallucinations were ongoing.  He had more decline in his mobility.  I suggested we increase his Exelon to 4.5 mg bid.    His wife called in the interim requesting a prescription for Nuplazid.  We also sent another referral to geriatric psychiatry and he was referred to [facility] psychiatry.    I saw him in a virtual visit on 07/06/2018, at which time I suggested we increase his Sinemet to 2 pills 6 times a day.  He was advised to continue with Sinemet CR at bedtime.    In the interim, I ordered Nuplazid for his hallucinations but it was too expensive.  I made a referral to geriatric psychiatry.  The office he was referred to declined the referral.     I saw him on 04/22/2018, at which time he was noted to be more slower and more unsteady. I suggested we increase his Sinemet to from 2 pills 4 times a day to 2 pills 5 times a day. I also suggested we increase his Exelon from 1.5 mg twice a day to 3 mg twice a day.   His wife called in the interim in March 2020, reporting that he was much more lethargic. She was advised to take him to primary care to get checked out for acute medical problems such as urinary tract infection, dehydration, electrolyte disturbance, upper respiratory infection etc.   I saw him on 12/21/2017, at which time he reported having fallen secondary to right leg giving out. He was having more difficulty walking. He was noted to be more forgetful by his wife. MMSE was 21 at the time and I suggested we start him on Exelon. We kept his other medications the same.    I saw him on 08/10/2013, at which time he was having physical therapy and occupational therapy at home. He had recently been discharged from inpatient rehabilitation at [facility]. He had triceps tendon repair on the right side in  February 2019. I suggested we keep his prescriptions as same including Sinemet, Sinemet CR, Lexapro and clonazepam. I prescribed a U step walker.    I saw him on 05/18/2017, at which time he reported a recent fall with injury to right elbow. He also bumped his head. He fell in the bathroom. He went to the emergency room and I reviewed records at the time. He had a head CT without contrast which showed no acute process, moderate volume loss, moderate chronic small vessel disease. He needed surgery to his right arm under orthopedics.   I saw him on 01/12/2017, at which time he reported to recent falls. He was  trying to work out on his own and was supposed to start working with a Physiological scientist.     I saw him on 07/09/2016, at which time he was fairly stable.     I saw him on 03/06/2016 at which time he reported doing better, he had some residual right knee pain. He had an appointment with his orthopedic surgeon. He reported one fall that led to an ER visit via EMS. He also had an admission to the hospital in October 2017 secondary to nausea, vomiting, urinary retention. He was noted to have altered mental status and acute kidney injury. He had a head CT without contrast on 12/31/2015 which showed:    1.  No acute intracranial abnormality. 2. Mild increase in generalized atrophy from 2013. Chronic small vessel ischemia is stable.   He was in the hospital for 4 days, then at [facility] in rehabilitation for about 10 days. He has ongoing issues with particularly right leg stiffness, no additional falls, does not appear to be very motivated to exercise. He had been in the PD boxing class. I suggested we continue with Sinemet 2 pills 4 times a day, Sinemet CR at bedtime, gabapentin, clonazepam for RBD at night and the only change was an increase in Lexapro from 5 mg to 10 mg daily.   I saw him on 10/22/2015, at which time he reported worse mobility, more stiffness in the right leg, more freezing  episodes, difficulty getting out of bed especially at night when he would have to go to the bathroom. His wife would have to help him. Thankfully, he had no recent falls and was using a cane. He had finished physical therapy was not using his walker. He was not exercising very much to. He was more sedentary. He had right total knee replacement on 07/12/2015. I suggested we increase his Sinemet to 2 pills 4 times a day. We kept his gabapentin the same. We kept the Sinemet CR at bedtime the same.   I saw him on 04/26/2015, at which time he reported taking carbidopa/levodopa 25-100 milligrams strength, about 7 pills per day, 2 for the 1st dose, 1 1/2 pills for the 2nd and 4 th dose, 2 for the 3rd dose, and 1 CR at night. He finished physical therapy and was able to walk without a cane. He reported right knee pain and indicated that he may need right knee surgery as well.   I saw him on 01/18/2015, at which time he reported doing better slowly. He had finished home health physical therapy. He was supposed to start outpatient physical therapy through orthopedics. He had left total knee arthroplasty on 11/30/2014. I reviewed the operative note as well as his discharge summary. He was discharged on 12/03/2014 to inpatient rehabilitation. Of note, he presented to the emergency room on 12/16/2014 secondary to repeated vomiting. This was deemed secondary to constipation. Cardiac enzymes were tested which were negative for any acute coronary syndrome. I reviewed the emergency room records. He had blood work which I reviewed. INR was 2.59. Hemoglobin was slightly low at 11.9 and hematocrit was 34.6. Troponin was negative 2. He overall felt a little better since we increased his Sinemet. He was taking a long-acting Sinemet CR at night. He had some off time in the middle of the night.  He felt he was making slow progress after his knee replacement surgery. He had some issues with edema. He was wearing compression stockings  for this. He was also placed on  furosemide for a little while. He was no longer on narcotic pain medications. He was using a cane at the time. I suggested we continue with his medication regimen, he was alternating Sinemet 2 pills with 1-1/2 pills for a total of 4 doses a day.   He missed an appointment on 12/19/2014. I saw him on 08/22/2014, at which time he reported bilateral knee pain. He had seen Dr. Tonita Cong for his knee arthritis and had undergone injections into both knees. He did not think the injections helped a lot. He was exploring knee replacement surgery. He was having more difficulty getting out of chairs. He had no freezing. He did think that the increase in Sinemet had helped in the past. He was alternating 1 pill with 1-1/2 pills. He was taking Sinemet CR at night. He was on gabapentin 100 mg 3 times a day and Lexapro 5 mg daily as well as clonazepam 0.5 mg each night. He had no new memory or mood issues. He was overall moving slower. He had no recent falls.   In the interim, his wife called on 01/04/2015 reported that he had worsening symptoms. I suggested we increase his Sinemet slightly to 2 tablets alternating with 1-1/2 tablets.   I saw him on 06/01/2014, at which time he reported overall doing well. He was not able to sleep through the night. He was consistently waking up between 3 and 4 AM. He had nocturia twice on average. He felt improved from the bladder medication but still reported disrupted sleep. He felt clonazepam has helped. His wife agreed. He also felt that the increase in Sinemet was helpful in his motor function. It was difficult for him to keep up with the 5 pills a day schedule. He was going to the [facility] 3 times a week for about 30 minutes. Lexapro has helped his mood. His wife felt that he was less anxious. I suggested we increase his clonazepam to 0.5 mg each night. I suggested we change his Sinemet to 1 pill alternating with one half pills for a total of 5 pills daily but  for doses. I suggested he continue with Sinemet CR at bedtime.   I saw him on 03/27/2014, at which time he reported sleeping a little bit better but his tremor was worse. He did not feel Sinemet was lasting him 4 hours in between 2 different doses. He was going to the gym twice a week. He was back on an antidepressant. He was supposed to start a new bladder medication. He never actually started clonazepam for RBD. I prescribed clonazepam for him for RBD. I increased his Sinemet to one fill 5 times a day.   I saw him on 12/01/2013, at which time he reported, that he stopped Myrbetric due to new onset facial swelling, but this was no better after stopping it. His facial swelling may have started after the lexapro. Adding long-acting Sinemet helped him sleep a little bit longer up to 3 AM. I suggested he stop Lexapro because of a possible allergic reaction to it. I also added low-dose clonazepam for his RBD and for his sleep.   I saw him on 09/29/2013 at which time he presented for a sooner than scheduled appointment because of increasing tremors and parkinsonian symptoms. He also felt more anxious but not frankly depressed. His wife felt that he may have been depressed. She reported dream enactments. She also reported loud snoring. He had a sleep study over 5 years ago, and that was negative  for OSA at the time. He had started Myrbetriq recently. He has been taking gabapentin 3 times a day, and 7, 11 and 3. He takes Sinemet regularly at 7, 11, 3, and 7. I suggested that he start low-dose Lexapro at 5 mg strength. I changed the timing of his gabapentin to one pill at 7 AM, 1 pill at 11 AM and one pill at 7 PM. I asked him to continue with Sinemet 4 times a day. I asked him to add a long-acting Sinemet CR 50/200 mg strength once daily at bed time. I suggested he return for a sleep study. He had a diagnostic polysomnogram on 11/09/2013 and I went over his test results with him in detail today. Sleep efficiency was  reduced at 58.7% with a latency to sleep of 82 minutes and wake after sleep onset of 117 minutes with mild to moderate sleep fragmentation noted. He had an increased percentage of light stage sleep, absence of deep sleep and 16.8% of dream sleep with a prolonged REM latency. He had mild to moderate snoring. Total AHI was 3.2 per hour, he had some lack of REM atonia.   I saw him on 05/31/2013, at which time I felt he was fairly stable. I kept him on Sinemet 4 times a day and gabapentin 100 mg 3 times a day. He reported, going up to 4 times a day with his Sinemet helped. He reported no side effects with gabapentin or Sinemet and seemed to tolerate them well. He had some residual facial tingling which was tolerable to him. He has LBP and went to [facility]. He had an Xray of the back and was told there was degenerative disease and was given and was given a 3 week taper of oral steroids, which helped. He had stopped exercising d/t back pain and was scheduled for PT evaluation through ortho. He has been on metamucil for constipation which helped.   I saw him on 11/30/2012, at which time I suggested that he continue gabapentin 100 mg 1 pill 3 times a day for his paresthesias and encouraged him to continue Sinemet with 25/100 mg strength one tablet 4 times a day, at 7, 11, 3 PM and 7 PM.     I first met him on 06/22/2012 and he presented on 08/23/2012 for a sooner than scheduled appointment because he felt his medication was not helpful. He previously followed with Dr. Morene Antu and had been complaining of paresthesias. Dr. Erling Cruz had started him on low-dose gabapentin. For his parkinsonism which was first noted in June of 2012 he was tried on pramipexole which helped, but at his first visit with me he told me that his gabapentin was not helpful and he also discontinued pramipexole a few months prior because of swelling in his feet and ankles. This improved after stopping both medications. At the time of his first  visit with me I felt he had mild parkinsonism without much in the way of lateralization. I started him on gabapentin again because of his paresthesias and also asked him to start low-dose Sinemet. He called back stating that the Sinemet was not helpful. He requested a sooner appointment. He had been continuing taking gabapentin 100 mg 3 times a day and Sinemet one pill 3 times a day with minimal improvement as he reported last time. I suggested at that visit that he increase his Sinemet to one pill 4 times a day and continue with gabapentin 100 mg 3 times a day. I also  felt that he may have right sided predominant idiopathic Parkinson's disease due to an intermittent tremor noted only on the right side.     His Past Medical History Is Significant For: Past Medical History:  Diagnosis Date   Abnormal PFT 1. 05/18/08  2. 11/30/08   1. Showed mild airflow obstruction, mild restriction, mild diffusion defect; FEV1 2.22(64%), FVC 3.33(65%), FEVi% 67, TLC 5.19(69%), DLCO 77%, +BD  2. FEV1 2.38(73%), FVC 3.81(80%), FEV1% 63, TLC 5.61(80%), DLCO 79%, no BD   Allergic rhinitis    Anxiety    BPH (benign prostatic hyperplasia)    Cellulitis    right leg MRSA   Erectile dysfunction    GERD (gastroesophageal reflux disease)    Heart murmur    mild MR by echo   Hyperlipidemia    Hypertension    Osteoarthritis    Paresthesias 06/22/2012   Parkinsonism ([place]) 06/22/2012   Permanent atrial fibrillation ([place])    Restrictive lung disease     His Past Surgical History Is Significant For: Past Surgical History:  Procedure Laterality Date   CARDIAC CATHETERIZATION  11/2009   normal coronary arteries   CARDIOVERSION     multiple   CATARACT EXTRACTION     COLONOSCOPY     COLONOSCOPY WITH PROPOFOL N/A 09/02/2016   Procedure: COLONOSCOPY WITH PROPOFOL;  Surgeon: Garlan Fair, MD;  Location: [facility];  Service: Endoscopy;  Laterality: N/A;   EYE SURGERY     bilateral cataract extraction   JOINT  REPLACEMENT     TOTAL KNEE ARTHROPLASTY Left 11/30/2014   Procedure: TOTAL KNEE ARTHROPLASTY;  Surgeon: Susa Day, MD;  Location: [facility];  Service: Orthopedics;  Laterality: Left;   TOTAL KNEE ARTHROPLASTY Right 07/12/2015   Procedure: RIGHT TOTAL KNEE ARTHROPLASTY;  Surgeon: Susa Day, MD;  Location: [facility];  Service: Orthopedics;  Laterality: Right;   TRICEPS TENDON REPAIR Right 05/20/2017   TRICEPS TENDON REPAIR Right 05/20/2017   Procedure: TRICEPS TENDON REPAIR;  Surgeon: Iran Planas, MD;  Location: [facility];  Service: Orthopedics;  Laterality: Right;    His Family History Is Significant For: Family History  Problem Relation Age of Onset   Diabetes Mother        DM   Stroke Father        CVA   Pancreatic cancer Brother    Pancreatic cancer Other        Nephew    His Social History Is Significant For: Social History   Socioeconomic History   Marital status: Married    Spouse name: [name]   Number of children: 4   Years of education: masters   Highest education level: Not on file  Occupational History   Occupation: Chief of Staff    Comment: Worked with [employer]  Tobacco Use   Smoking status: Never   Smokeless tobacco: Never  Vaping Use   Vaping Use: Never used  Substance and Sexual Activity   Alcohol use: No    Alcohol/week: 0.0 standard drinks   Drug use: No   Sexual activity: Not on file  Other Topics Concern   Not on file  Social History Narrative   Married and lives in [place].  Retired Optometrist.   No reported caffeine use.    Social Determinants of Health   Financial Resource Strain: Not on file  Food Insecurity: Not on file  Transportation Needs: Not on file  Physical Activity: Not on file  Stress: Not on file  Social Connections: Not on file  His Allergies Are:  Allergies  Allergen Reactions   Finasteride Other (See Comments)    More frequent urination.   Other Other (See Comments)   Oxybutynin Other (See Comments)    Lisinopril Cough and Other (See Comments)  :   His Current Medications Are:  Outpatient Encounter Medications as of 11/05/2020  Medication Sig   apixaban (ELIQUIS) 5 MG TABS tablet Take 1 tablet (5 mg total) by mouth 2 (two) times daily.   carbidopa-levodopa (SINEMET CR) 50-200 MG tablet Take 2 tablets by mouth at bedtime.   carbidopa-levodopa (SINEMET IR) 25-100 MG tablet 2 pills 6 times a day, starting at 11 AM, every 2 hours   cholecalciferol (VITAMIN D) 1000 units tablet Take 1,000 Units by mouth daily.    clonazePAM (KLONOPIN) 1 MG disintegrating tablet Take 1 tablet (1 mg total) by mouth at bedtime.   escitalopram (LEXAPRO) 10 MG tablet Take 1 tablet (10 mg total) by mouth at bedtime.   famotidine (PEPCID) 40 MG tablet Take 40 mg by mouth daily.    losartan (COZAAR) 25 MG tablet Take 1 tablet (25 mg total) by mouth every morning.   Multiple Vitamin (MULTIVITAMIN WITH MINERALS) TABS tablet Take 1 tablet by mouth daily.    pravastatin (PRAVACHOL) 40 MG tablet Take 40 mg by mouth daily.    psyllium (METAMUCIL) 58.6 % packet Take 1 packet by mouth daily.   rivastigmine (EXELON) 9.5 mg/24hr Place 1 patch (9.5 mg total) onto the skin daily.   tamsulosin (FLOMAX) 0.4 MG CAPS capsule Take 0.4 mg at bedtime by mouth.   vitamin C (ASCORBIC ACID) 500 MG tablet Take 500 mg by mouth daily.    No facility-administered encounter medications on file as of 11/05/2020.  :  Review of Systems:  Out of a complete 14 point review of systems, all are reviewed and negative with the exception of these symptoms as listed below:  Review of Systems  Neurological:        Patient here for follow-up of PD. He states he has been doing fine. Wife states no changes.   Objective:  Neurological Exam  Physical Exam Physical Examination:   Vitals:   11/05/20 1303  BP: (!) 141/90  Pulse: 71    General Examination: The patient is a very pleasant 80 y.o. male in no acute distress.  He is situated in his  wheelchair, gait belt in place.  He appears to be a little bit more interactive today.  Well-groomed.    HEENT: Normocephalic, atraumatic, pupils are equal, round and reactive to light, s/p cataract repairs. Extraocular tracking is impaired.  He has significant nuchal rigidity, decreased range of motion in the neck.  Moderate hypophonia and moderate dysarthria.  He has stable findings on airway examination.  Tongue protrudes centrally and palate elevates symmetrically.  Mildly thick saliva.  No obvious sialorrhea.   Chest: Clear to auscultation without wheezing, rhonchi or crackles noted.   Heart: S1+S2+0, mildly irregular.      Abdomen: Soft, non-tender and non-distended.   Extremities: There is no edema in the distal lower extremities bilaterally.    Skin: Warm and dry without trophic changes noted.     Musculoskeletal: exam reveals no obvious joint deformities.     Neurologically: Mental status: The patient is awake, alert and pays attention. His immediate and remote memory, attention, language skills and fund of knowledge are impaired.  He is unable to provide his history.  On 12/21/2017: MMSE: 21/28, CDT: could not draw. AFT: 11/min.  On 04/22/2018: MMSE: 23/28, cannot draw, CDT: Cannot draw a clock, AFT: 8/min.   On 02/09/2019: MMSE: 18/30, Cannot draw, cannot draw clock, AFT: 5/min.   On 06/15/2019: MMSE: 17/30, CDT: 1/4, AFT: 10/min.   On 11/21/2019: MMSE: 23/30, AFT: 7/min.   Cranial nerves II - XII are as described above under HEENT exam. Motor exam: Normal bulk, global strength of 4-/5, No resting tremor. Romberg is not tested for safety reasons. Reflexes are not tested today. Fine motor exam shows moderate to severe impairment of all fine motor skills, more so on the right.  Cerebellar testing: No dysmetria or intention tremor.  Sensory exam: intact to light touch in the upper and lower extremities.  Gait, station and balance: I did not have him stand or walk for me today, as  he is in a wheelchair.   Assessment and Plan:    In summary, DECARLO RIVET is a very pleasant 80 year old male with a history of atrial fibrillation, hypertension, hyperlipidemia, who presents for followup consultation of his right-sided predominant, Parkinson's disease, akinetic-rigid type, complicated by Recurrent falls, memory loss, anxiety, depression, sleep disturbance, arthritis with status post bilateral knee replacement surgeries constipation and hallucinations.  Nuplazid was not affordable unfortunately. He has had decline in his mobility over time and has fallen.  He has had worsening memory loss and hallucinations. He was on Exelon 4.5 mg twice daily but had trouble swallowing the capsules.  We switched him to the patch about 3 months ago after in January 2022.   His exam is stable.  He has had decline in mobility and coordination.  I would like to refer him to home health physical and Occupational Therapy.  I think it would help to do range of motion exercises and work on his coordination in the upper extremities and try to prevent contractures as well.   He is advised to continue his medications including Sinemet, Sinemet CR, clonazepam, Lexapro, and Exelon.  He has been off the gabapentin.  They have a CNA help 24 hours a day during the workweek.  I renewed all prescriptions.  I will place an order for home health physical and occupational therapies. I advised him to follow-up routinely in 4 months, sooner if needed.  I answered all the questions today and the patient and his wife were in agreement. I spent 30 minutes in total face-to-face time and in reviewing records during pre-charting, more than 50% of which was spent in counseling and coordination of care, reviewing test results, reviewing medications and treatment regimen and/or in discussing or reviewing the diagnosis of PD, the prognosis and treatment options. Pertinent laboratory and imaging test results that were available during this  visit with the patient were reviewed by me and considered in my medical decision making (see chart for details).

## 2020-11-05 NOTE — Patient Instructions (Signed)
It was nice to see you both again today.  Your exam is stable and we will maintain your medications, I have renewed your prescriptions today.  In addition, I will refer you to home health physical and Occupational Therapy, we will probably reach out to [facility] again.  I have placed a referral in that regard, I think it would help if you did some range of motion exercises to keep working on mobility and preventing additional stiffness or contractures in your limbs.  It may also help to maintain coordination in your hands.  Please follow-up routinely in 4 months.

## 2020-11-10 DIAGNOSIS — F0280 Dementia in other diseases classified elsewhere without behavioral disturbance: Secondary | ICD-10-CM | POA: Diagnosis not present

## 2020-11-10 DIAGNOSIS — R296 Repeated falls: Secondary | ICD-10-CM | POA: Diagnosis not present

## 2020-11-10 DIAGNOSIS — I4821 Permanent atrial fibrillation: Secondary | ICD-10-CM | POA: Diagnosis not present

## 2020-11-10 DIAGNOSIS — J984 Other disorders of lung: Secondary | ICD-10-CM | POA: Diagnosis not present

## 2020-11-10 DIAGNOSIS — Z7901 Long term (current) use of anticoagulants: Secondary | ICD-10-CM | POA: Diagnosis not present

## 2020-11-10 DIAGNOSIS — G20 Parkinson's disease: Secondary | ICD-10-CM | POA: Diagnosis not present

## 2020-11-10 DIAGNOSIS — G4733 Obstructive sleep apnea (adult) (pediatric): Secondary | ICD-10-CM | POA: Diagnosis not present

## 2020-11-10 DIAGNOSIS — M1990 Unspecified osteoarthritis, unspecified site: Secondary | ICD-10-CM | POA: Diagnosis not present

## 2020-11-10 DIAGNOSIS — F419 Anxiety disorder, unspecified: Secondary | ICD-10-CM | POA: Diagnosis not present

## 2020-11-10 DIAGNOSIS — N400 Enlarged prostate without lower urinary tract symptoms: Secondary | ICD-10-CM | POA: Diagnosis not present

## 2020-11-10 DIAGNOSIS — F32A Depression, unspecified: Secondary | ICD-10-CM | POA: Diagnosis not present

## 2020-11-10 DIAGNOSIS — I10 Essential (primary) hypertension: Secondary | ICD-10-CM | POA: Diagnosis not present

## 2020-11-10 DIAGNOSIS — Z993 Dependence on wheelchair: Secondary | ICD-10-CM | POA: Diagnosis not present

## 2020-11-10 DIAGNOSIS — K219 Gastro-esophageal reflux disease without esophagitis: Secondary | ICD-10-CM | POA: Diagnosis not present

## 2020-11-10 DIAGNOSIS — Z9181 History of falling: Secondary | ICD-10-CM | POA: Diagnosis not present

## 2020-11-10 DIAGNOSIS — E785 Hyperlipidemia, unspecified: Secondary | ICD-10-CM | POA: Diagnosis not present

## 2020-11-12 ENCOUNTER — Telehealth: Payer: Self-pay | Admitting: Neurology

## 2020-11-12 NOTE — Telephone Encounter (Signed)
Returned Craig's call at [phone]. LVM with office number for call back. When [facility] calls back, please give v.o. for PT for 3x for 3 weeks and 2x for 5 weeks as requested.

## 2020-11-12 NOTE — Telephone Encounter (Signed)
Cecilie Lowers physical therapist called requesting VO for 3x for 3 weeks and 2x 5 weeks Please advise.

## 2020-11-13 DIAGNOSIS — I4821 Permanent atrial fibrillation: Secondary | ICD-10-CM | POA: Diagnosis not present

## 2020-11-13 DIAGNOSIS — J984 Other disorders of lung: Secondary | ICD-10-CM | POA: Diagnosis not present

## 2020-11-13 DIAGNOSIS — F0280 Dementia in other diseases classified elsewhere without behavioral disturbance: Secondary | ICD-10-CM | POA: Diagnosis not present

## 2020-11-13 DIAGNOSIS — I10 Essential (primary) hypertension: Secondary | ICD-10-CM | POA: Diagnosis not present

## 2020-11-13 DIAGNOSIS — R296 Repeated falls: Secondary | ICD-10-CM | POA: Diagnosis not present

## 2020-11-13 DIAGNOSIS — G20 Parkinson's disease: Secondary | ICD-10-CM | POA: Diagnosis not present

## 2020-11-13 NOTE — Telephone Encounter (Signed)
I called Cecilie Lowers PT again and LVM with v.o. for PT 3x for 3 weeks and 2x for 5 weeks as requested. Left office number in message in case he has any questions.

## 2020-11-15 DIAGNOSIS — F0280 Dementia in other diseases classified elsewhere without behavioral disturbance: Secondary | ICD-10-CM | POA: Diagnosis not present

## 2020-11-15 DIAGNOSIS — R296 Repeated falls: Secondary | ICD-10-CM | POA: Diagnosis not present

## 2020-11-15 DIAGNOSIS — G20 Parkinson's disease: Secondary | ICD-10-CM | POA: Diagnosis not present

## 2020-11-15 DIAGNOSIS — I4821 Permanent atrial fibrillation: Secondary | ICD-10-CM | POA: Diagnosis not present

## 2020-11-15 DIAGNOSIS — J984 Other disorders of lung: Secondary | ICD-10-CM | POA: Diagnosis not present

## 2020-11-15 DIAGNOSIS — I959 Hypotension, unspecified: Secondary | ICD-10-CM | POA: Diagnosis not present

## 2020-11-15 DIAGNOSIS — I10 Essential (primary) hypertension: Secondary | ICD-10-CM | POA: Diagnosis not present

## 2020-11-15 DIAGNOSIS — R55 Syncope and collapse: Secondary | ICD-10-CM | POA: Diagnosis not present

## 2020-11-19 DIAGNOSIS — I10 Essential (primary) hypertension: Secondary | ICD-10-CM | POA: Diagnosis not present

## 2020-11-19 DIAGNOSIS — F0280 Dementia in other diseases classified elsewhere without behavioral disturbance: Secondary | ICD-10-CM | POA: Diagnosis not present

## 2020-11-19 DIAGNOSIS — R296 Repeated falls: Secondary | ICD-10-CM | POA: Diagnosis not present

## 2020-11-19 DIAGNOSIS — G20 Parkinson's disease: Secondary | ICD-10-CM | POA: Diagnosis not present

## 2020-11-19 DIAGNOSIS — I4821 Permanent atrial fibrillation: Secondary | ICD-10-CM | POA: Diagnosis not present

## 2020-11-19 DIAGNOSIS — J984 Other disorders of lung: Secondary | ICD-10-CM | POA: Diagnosis not present

## 2020-11-21 DIAGNOSIS — M1990 Unspecified osteoarthritis, unspecified site: Secondary | ICD-10-CM | POA: Diagnosis not present

## 2020-11-21 DIAGNOSIS — F0280 Dementia in other diseases classified elsewhere without behavioral disturbance: Secondary | ICD-10-CM | POA: Diagnosis not present

## 2020-11-21 DIAGNOSIS — J984 Other disorders of lung: Secondary | ICD-10-CM | POA: Diagnosis not present

## 2020-11-21 DIAGNOSIS — F0390 Unspecified dementia without behavioral disturbance: Secondary | ICD-10-CM | POA: Diagnosis not present

## 2020-11-21 DIAGNOSIS — K219 Gastro-esophageal reflux disease without esophagitis: Secondary | ICD-10-CM | POA: Diagnosis not present

## 2020-11-21 DIAGNOSIS — I4821 Permanent atrial fibrillation: Secondary | ICD-10-CM | POA: Diagnosis not present

## 2020-11-21 DIAGNOSIS — N182 Chronic kidney disease, stage 2 (mild): Secondary | ICD-10-CM | POA: Diagnosis not present

## 2020-11-21 DIAGNOSIS — G20 Parkinson's disease: Secondary | ICD-10-CM | POA: Diagnosis not present

## 2020-11-21 DIAGNOSIS — I10 Essential (primary) hypertension: Secondary | ICD-10-CM | POA: Diagnosis not present

## 2020-11-21 DIAGNOSIS — N400 Enlarged prostate without lower urinary tract symptoms: Secondary | ICD-10-CM | POA: Diagnosis not present

## 2020-11-21 DIAGNOSIS — E782 Mixed hyperlipidemia: Secondary | ICD-10-CM | POA: Diagnosis not present

## 2020-11-21 DIAGNOSIS — R296 Repeated falls: Secondary | ICD-10-CM | POA: Diagnosis not present

## 2020-11-23 DIAGNOSIS — I10 Essential (primary) hypertension: Secondary | ICD-10-CM | POA: Diagnosis not present

## 2020-11-23 DIAGNOSIS — R296 Repeated falls: Secondary | ICD-10-CM | POA: Diagnosis not present

## 2020-11-23 DIAGNOSIS — J984 Other disorders of lung: Secondary | ICD-10-CM | POA: Diagnosis not present

## 2020-11-23 DIAGNOSIS — I4821 Permanent atrial fibrillation: Secondary | ICD-10-CM | POA: Diagnosis not present

## 2020-11-23 DIAGNOSIS — F0280 Dementia in other diseases classified elsewhere without behavioral disturbance: Secondary | ICD-10-CM | POA: Diagnosis not present

## 2020-11-23 DIAGNOSIS — G20 Parkinson's disease: Secondary | ICD-10-CM | POA: Diagnosis not present

## 2020-11-27 DIAGNOSIS — F0280 Dementia in other diseases classified elsewhere without behavioral disturbance: Secondary | ICD-10-CM | POA: Diagnosis not present

## 2020-11-27 DIAGNOSIS — G20 Parkinson's disease: Secondary | ICD-10-CM | POA: Diagnosis not present

## 2020-11-27 DIAGNOSIS — R296 Repeated falls: Secondary | ICD-10-CM | POA: Diagnosis not present

## 2020-11-27 DIAGNOSIS — I4821 Permanent atrial fibrillation: Secondary | ICD-10-CM | POA: Diagnosis not present

## 2020-11-27 DIAGNOSIS — J984 Other disorders of lung: Secondary | ICD-10-CM | POA: Diagnosis not present

## 2020-11-27 DIAGNOSIS — I10 Essential (primary) hypertension: Secondary | ICD-10-CM | POA: Diagnosis not present

## 2020-11-29 DIAGNOSIS — I4821 Permanent atrial fibrillation: Secondary | ICD-10-CM | POA: Diagnosis not present

## 2020-11-29 DIAGNOSIS — J984 Other disorders of lung: Secondary | ICD-10-CM | POA: Diagnosis not present

## 2020-11-29 DIAGNOSIS — G20 Parkinson's disease: Secondary | ICD-10-CM | POA: Diagnosis not present

## 2020-11-29 DIAGNOSIS — R296 Repeated falls: Secondary | ICD-10-CM | POA: Diagnosis not present

## 2020-11-29 DIAGNOSIS — F0280 Dementia in other diseases classified elsewhere without behavioral disturbance: Secondary | ICD-10-CM | POA: Diagnosis not present

## 2020-11-29 DIAGNOSIS — I10 Essential (primary) hypertension: Secondary | ICD-10-CM | POA: Diagnosis not present

## 2020-11-30 DIAGNOSIS — I10 Essential (primary) hypertension: Secondary | ICD-10-CM | POA: Diagnosis not present

## 2020-11-30 DIAGNOSIS — F0280 Dementia in other diseases classified elsewhere without behavioral disturbance: Secondary | ICD-10-CM | POA: Diagnosis not present

## 2020-11-30 DIAGNOSIS — G20 Parkinson's disease: Secondary | ICD-10-CM | POA: Diagnosis not present

## 2020-11-30 DIAGNOSIS — J984 Other disorders of lung: Secondary | ICD-10-CM | POA: Diagnosis not present

## 2020-11-30 DIAGNOSIS — R296 Repeated falls: Secondary | ICD-10-CM | POA: Diagnosis not present

## 2020-11-30 DIAGNOSIS — I4821 Permanent atrial fibrillation: Secondary | ICD-10-CM | POA: Diagnosis not present

## 2020-12-03 DIAGNOSIS — R296 Repeated falls: Secondary | ICD-10-CM | POA: Diagnosis not present

## 2020-12-03 DIAGNOSIS — I10 Essential (primary) hypertension: Secondary | ICD-10-CM | POA: Diagnosis not present

## 2020-12-03 DIAGNOSIS — G20 Parkinson's disease: Secondary | ICD-10-CM | POA: Diagnosis not present

## 2020-12-03 DIAGNOSIS — J984 Other disorders of lung: Secondary | ICD-10-CM | POA: Diagnosis not present

## 2020-12-03 DIAGNOSIS — F0280 Dementia in other diseases classified elsewhere without behavioral disturbance: Secondary | ICD-10-CM | POA: Diagnosis not present

## 2020-12-03 DIAGNOSIS — I4821 Permanent atrial fibrillation: Secondary | ICD-10-CM | POA: Diagnosis not present

## 2020-12-06 DIAGNOSIS — I10 Essential (primary) hypertension: Secondary | ICD-10-CM | POA: Diagnosis not present

## 2020-12-06 DIAGNOSIS — R296 Repeated falls: Secondary | ICD-10-CM | POA: Diagnosis not present

## 2020-12-06 DIAGNOSIS — F0280 Dementia in other diseases classified elsewhere without behavioral disturbance: Secondary | ICD-10-CM | POA: Diagnosis not present

## 2020-12-06 DIAGNOSIS — G20 Parkinson's disease: Secondary | ICD-10-CM | POA: Diagnosis not present

## 2020-12-06 DIAGNOSIS — I4821 Permanent atrial fibrillation: Secondary | ICD-10-CM | POA: Diagnosis not present

## 2020-12-06 DIAGNOSIS — J984 Other disorders of lung: Secondary | ICD-10-CM | POA: Diagnosis not present

## 2020-12-07 DIAGNOSIS — I4821 Permanent atrial fibrillation: Secondary | ICD-10-CM | POA: Diagnosis not present

## 2020-12-07 DIAGNOSIS — F0280 Dementia in other diseases classified elsewhere without behavioral disturbance: Secondary | ICD-10-CM | POA: Diagnosis not present

## 2020-12-07 DIAGNOSIS — G20 Parkinson's disease: Secondary | ICD-10-CM | POA: Diagnosis not present

## 2020-12-07 DIAGNOSIS — I10 Essential (primary) hypertension: Secondary | ICD-10-CM | POA: Diagnosis not present

## 2020-12-07 DIAGNOSIS — J984 Other disorders of lung: Secondary | ICD-10-CM | POA: Diagnosis not present

## 2020-12-07 DIAGNOSIS — R296 Repeated falls: Secondary | ICD-10-CM | POA: Diagnosis not present

## 2020-12-10 DIAGNOSIS — M1990 Unspecified osteoarthritis, unspecified site: Secondary | ICD-10-CM | POA: Diagnosis not present

## 2020-12-10 DIAGNOSIS — G20 Parkinson's disease: Secondary | ICD-10-CM | POA: Diagnosis not present

## 2020-12-10 DIAGNOSIS — F0280 Dementia in other diseases classified elsewhere without behavioral disturbance: Secondary | ICD-10-CM | POA: Diagnosis not present

## 2020-12-10 DIAGNOSIS — Z9181 History of falling: Secondary | ICD-10-CM | POA: Diagnosis not present

## 2020-12-10 DIAGNOSIS — N400 Enlarged prostate without lower urinary tract symptoms: Secondary | ICD-10-CM | POA: Diagnosis not present

## 2020-12-10 DIAGNOSIS — I4821 Permanent atrial fibrillation: Secondary | ICD-10-CM | POA: Diagnosis not present

## 2020-12-10 DIAGNOSIS — Z993 Dependence on wheelchair: Secondary | ICD-10-CM | POA: Diagnosis not present

## 2020-12-10 DIAGNOSIS — I10 Essential (primary) hypertension: Secondary | ICD-10-CM | POA: Diagnosis not present

## 2020-12-10 DIAGNOSIS — K219 Gastro-esophageal reflux disease without esophagitis: Secondary | ICD-10-CM | POA: Diagnosis not present

## 2020-12-10 DIAGNOSIS — G4733 Obstructive sleep apnea (adult) (pediatric): Secondary | ICD-10-CM | POA: Diagnosis not present

## 2020-12-10 DIAGNOSIS — Z7901 Long term (current) use of anticoagulants: Secondary | ICD-10-CM | POA: Diagnosis not present

## 2020-12-10 DIAGNOSIS — R296 Repeated falls: Secondary | ICD-10-CM | POA: Diagnosis not present

## 2020-12-10 DIAGNOSIS — J984 Other disorders of lung: Secondary | ICD-10-CM | POA: Diagnosis not present

## 2020-12-10 DIAGNOSIS — E785 Hyperlipidemia, unspecified: Secondary | ICD-10-CM | POA: Diagnosis not present

## 2020-12-10 DIAGNOSIS — F419 Anxiety disorder, unspecified: Secondary | ICD-10-CM | POA: Diagnosis not present

## 2020-12-10 DIAGNOSIS — F32A Depression, unspecified: Secondary | ICD-10-CM | POA: Diagnosis not present

## 2020-12-13 DIAGNOSIS — I4821 Permanent atrial fibrillation: Secondary | ICD-10-CM | POA: Diagnosis not present

## 2020-12-13 DIAGNOSIS — J984 Other disorders of lung: Secondary | ICD-10-CM | POA: Diagnosis not present

## 2020-12-13 DIAGNOSIS — G20 Parkinson's disease: Secondary | ICD-10-CM | POA: Diagnosis not present

## 2020-12-13 DIAGNOSIS — I10 Essential (primary) hypertension: Secondary | ICD-10-CM | POA: Diagnosis not present

## 2020-12-13 DIAGNOSIS — F0280 Dementia in other diseases classified elsewhere without behavioral disturbance: Secondary | ICD-10-CM | POA: Diagnosis not present

## 2020-12-13 DIAGNOSIS — R296 Repeated falls: Secondary | ICD-10-CM | POA: Diagnosis not present

## 2020-12-14 DIAGNOSIS — I10 Essential (primary) hypertension: Secondary | ICD-10-CM | POA: Diagnosis not present

## 2020-12-14 DIAGNOSIS — G20 Parkinson's disease: Secondary | ICD-10-CM | POA: Diagnosis not present

## 2020-12-14 DIAGNOSIS — F0280 Dementia in other diseases classified elsewhere without behavioral disturbance: Secondary | ICD-10-CM | POA: Diagnosis not present

## 2020-12-14 DIAGNOSIS — R296 Repeated falls: Secondary | ICD-10-CM | POA: Diagnosis not present

## 2020-12-14 DIAGNOSIS — J984 Other disorders of lung: Secondary | ICD-10-CM | POA: Diagnosis not present

## 2020-12-14 DIAGNOSIS — I4821 Permanent atrial fibrillation: Secondary | ICD-10-CM | POA: Diagnosis not present

## 2020-12-17 DIAGNOSIS — J984 Other disorders of lung: Secondary | ICD-10-CM | POA: Diagnosis not present

## 2020-12-17 DIAGNOSIS — F0280 Dementia in other diseases classified elsewhere without behavioral disturbance: Secondary | ICD-10-CM | POA: Diagnosis not present

## 2020-12-17 DIAGNOSIS — I10 Essential (primary) hypertension: Secondary | ICD-10-CM | POA: Diagnosis not present

## 2020-12-17 DIAGNOSIS — I4821 Permanent atrial fibrillation: Secondary | ICD-10-CM | POA: Diagnosis not present

## 2020-12-17 DIAGNOSIS — R296 Repeated falls: Secondary | ICD-10-CM | POA: Diagnosis not present

## 2020-12-17 DIAGNOSIS — G20 Parkinson's disease: Secondary | ICD-10-CM | POA: Diagnosis not present

## 2020-12-20 DIAGNOSIS — I10 Essential (primary) hypertension: Secondary | ICD-10-CM | POA: Diagnosis not present

## 2020-12-20 DIAGNOSIS — F0390 Unspecified dementia without behavioral disturbance: Secondary | ICD-10-CM | POA: Diagnosis not present

## 2020-12-20 DIAGNOSIS — R296 Repeated falls: Secondary | ICD-10-CM | POA: Diagnosis not present

## 2020-12-20 DIAGNOSIS — M1990 Unspecified osteoarthritis, unspecified site: Secondary | ICD-10-CM | POA: Diagnosis not present

## 2020-12-20 DIAGNOSIS — J984 Other disorders of lung: Secondary | ICD-10-CM | POA: Diagnosis not present

## 2020-12-20 DIAGNOSIS — N182 Chronic kidney disease, stage 2 (mild): Secondary | ICD-10-CM | POA: Diagnosis not present

## 2020-12-20 DIAGNOSIS — K219 Gastro-esophageal reflux disease without esophagitis: Secondary | ICD-10-CM | POA: Diagnosis not present

## 2020-12-20 DIAGNOSIS — I4821 Permanent atrial fibrillation: Secondary | ICD-10-CM | POA: Diagnosis not present

## 2020-12-20 DIAGNOSIS — G20 Parkinson's disease: Secondary | ICD-10-CM | POA: Diagnosis not present

## 2020-12-20 DIAGNOSIS — F0280 Dementia in other diseases classified elsewhere without behavioral disturbance: Secondary | ICD-10-CM | POA: Diagnosis not present

## 2020-12-20 DIAGNOSIS — E782 Mixed hyperlipidemia: Secondary | ICD-10-CM | POA: Diagnosis not present

## 2020-12-20 DIAGNOSIS — N400 Enlarged prostate without lower urinary tract symptoms: Secondary | ICD-10-CM | POA: Diagnosis not present

## 2020-12-25 DIAGNOSIS — I10 Essential (primary) hypertension: Secondary | ICD-10-CM | POA: Diagnosis not present

## 2020-12-25 DIAGNOSIS — J984 Other disorders of lung: Secondary | ICD-10-CM | POA: Diagnosis not present

## 2020-12-25 DIAGNOSIS — R296 Repeated falls: Secondary | ICD-10-CM | POA: Diagnosis not present

## 2020-12-25 DIAGNOSIS — I4821 Permanent atrial fibrillation: Secondary | ICD-10-CM | POA: Diagnosis not present

## 2020-12-25 DIAGNOSIS — G20 Parkinson's disease: Secondary | ICD-10-CM | POA: Diagnosis not present

## 2020-12-25 DIAGNOSIS — F0280 Dementia in other diseases classified elsewhere without behavioral disturbance: Secondary | ICD-10-CM | POA: Diagnosis not present

## 2020-12-28 DIAGNOSIS — G20 Parkinson's disease: Secondary | ICD-10-CM | POA: Diagnosis not present

## 2020-12-28 DIAGNOSIS — F0280 Dementia in other diseases classified elsewhere without behavioral disturbance: Secondary | ICD-10-CM | POA: Diagnosis not present

## 2020-12-28 DIAGNOSIS — I4821 Permanent atrial fibrillation: Secondary | ICD-10-CM | POA: Diagnosis not present

## 2020-12-28 DIAGNOSIS — I10 Essential (primary) hypertension: Secondary | ICD-10-CM | POA: Diagnosis not present

## 2020-12-28 DIAGNOSIS — R296 Repeated falls: Secondary | ICD-10-CM | POA: Diagnosis not present

## 2020-12-28 DIAGNOSIS — J984 Other disorders of lung: Secondary | ICD-10-CM | POA: Diagnosis not present

## 2020-12-31 DIAGNOSIS — J984 Other disorders of lung: Secondary | ICD-10-CM | POA: Diagnosis not present

## 2020-12-31 DIAGNOSIS — G20 Parkinson's disease: Secondary | ICD-10-CM | POA: Diagnosis not present

## 2020-12-31 DIAGNOSIS — I10 Essential (primary) hypertension: Secondary | ICD-10-CM | POA: Diagnosis not present

## 2020-12-31 DIAGNOSIS — F0280 Dementia in other diseases classified elsewhere without behavioral disturbance: Secondary | ICD-10-CM | POA: Diagnosis not present

## 2020-12-31 DIAGNOSIS — I4821 Permanent atrial fibrillation: Secondary | ICD-10-CM | POA: Diagnosis not present

## 2020-12-31 DIAGNOSIS — R296 Repeated falls: Secondary | ICD-10-CM | POA: Diagnosis not present

## 2021-01-02 DIAGNOSIS — R296 Repeated falls: Secondary | ICD-10-CM | POA: Diagnosis not present

## 2021-01-02 DIAGNOSIS — I4821 Permanent atrial fibrillation: Secondary | ICD-10-CM | POA: Diagnosis not present

## 2021-01-02 DIAGNOSIS — J984 Other disorders of lung: Secondary | ICD-10-CM | POA: Diagnosis not present

## 2021-01-02 DIAGNOSIS — I10 Essential (primary) hypertension: Secondary | ICD-10-CM | POA: Diagnosis not present

## 2021-01-02 DIAGNOSIS — G20 Parkinson's disease: Secondary | ICD-10-CM | POA: Diagnosis not present

## 2021-01-02 DIAGNOSIS — F0280 Dementia in other diseases classified elsewhere without behavioral disturbance: Secondary | ICD-10-CM | POA: Diagnosis not present

## 2021-01-04 DIAGNOSIS — I4821 Permanent atrial fibrillation: Secondary | ICD-10-CM | POA: Diagnosis not present

## 2021-01-04 DIAGNOSIS — R296 Repeated falls: Secondary | ICD-10-CM | POA: Diagnosis not present

## 2021-01-04 DIAGNOSIS — J984 Other disorders of lung: Secondary | ICD-10-CM | POA: Diagnosis not present

## 2021-01-04 DIAGNOSIS — G20 Parkinson's disease: Secondary | ICD-10-CM | POA: Diagnosis not present

## 2021-01-04 DIAGNOSIS — F0280 Dementia in other diseases classified elsewhere without behavioral disturbance: Secondary | ICD-10-CM | POA: Diagnosis not present

## 2021-01-04 DIAGNOSIS — I10 Essential (primary) hypertension: Secondary | ICD-10-CM | POA: Diagnosis not present

## 2021-01-07 DIAGNOSIS — E782 Mixed hyperlipidemia: Secondary | ICD-10-CM | POA: Diagnosis not present

## 2021-01-07 DIAGNOSIS — K219 Gastro-esophageal reflux disease without esophagitis: Secondary | ICD-10-CM | POA: Diagnosis not present

## 2021-01-07 DIAGNOSIS — F0390 Unspecified dementia without behavioral disturbance: Secondary | ICD-10-CM | POA: Diagnosis not present

## 2021-01-07 DIAGNOSIS — N182 Chronic kidney disease, stage 2 (mild): Secondary | ICD-10-CM | POA: Diagnosis not present

## 2021-01-07 DIAGNOSIS — M1990 Unspecified osteoarthritis, unspecified site: Secondary | ICD-10-CM | POA: Diagnosis not present

## 2021-01-07 DIAGNOSIS — N400 Enlarged prostate without lower urinary tract symptoms: Secondary | ICD-10-CM | POA: Diagnosis not present

## 2021-01-07 DIAGNOSIS — G20 Parkinson's disease: Secondary | ICD-10-CM | POA: Diagnosis not present

## 2021-01-07 DIAGNOSIS — I10 Essential (primary) hypertension: Secondary | ICD-10-CM | POA: Diagnosis not present

## 2021-01-10 DIAGNOSIS — R338 Other retention of urine: Secondary | ICD-10-CM | POA: Diagnosis not present

## 2021-01-10 DIAGNOSIS — I4821 Permanent atrial fibrillation: Secondary | ICD-10-CM | POA: Diagnosis not present

## 2021-01-10 DIAGNOSIS — I5032 Chronic diastolic (congestive) heart failure: Secondary | ICD-10-CM | POA: Diagnosis not present

## 2021-01-10 DIAGNOSIS — N529 Male erectile dysfunction, unspecified: Secondary | ICD-10-CM | POA: Diagnosis not present

## 2021-01-10 DIAGNOSIS — Z87440 Personal history of urinary (tract) infections: Secondary | ICD-10-CM | POA: Diagnosis not present

## 2021-01-10 DIAGNOSIS — Z9181 History of falling: Secondary | ICD-10-CM | POA: Diagnosis not present

## 2021-01-10 DIAGNOSIS — F0390 Unspecified dementia without behavioral disturbance: Secondary | ICD-10-CM | POA: Diagnosis not present

## 2021-01-10 DIAGNOSIS — G4752 REM sleep behavior disorder: Secondary | ICD-10-CM | POA: Diagnosis not present

## 2021-01-10 DIAGNOSIS — I10 Essential (primary) hypertension: Secondary | ICD-10-CM | POA: Diagnosis not present

## 2021-01-10 DIAGNOSIS — I130 Hypertensive heart and chronic kidney disease with heart failure and stage 1 through stage 4 chronic kidney disease, or unspecified chronic kidney disease: Secondary | ICD-10-CM | POA: Diagnosis not present

## 2021-01-10 DIAGNOSIS — I482 Chronic atrial fibrillation, unspecified: Secondary | ICD-10-CM | POA: Diagnosis not present

## 2021-01-10 DIAGNOSIS — N39498 Other specified urinary incontinence: Secondary | ICD-10-CM | POA: Diagnosis not present

## 2021-01-10 DIAGNOSIS — N182 Chronic kidney disease, stage 2 (mild): Secondary | ICD-10-CM | POA: Diagnosis not present

## 2021-01-10 DIAGNOSIS — R1312 Dysphagia, oropharyngeal phase: Secondary | ICD-10-CM | POA: Diagnosis not present

## 2021-01-10 DIAGNOSIS — F0282 Dementia in other diseases classified elsewhere, unspecified severity, with psychotic disturbance: Secondary | ICD-10-CM | POA: Diagnosis not present

## 2021-01-10 DIAGNOSIS — N401 Enlarged prostate with lower urinary tract symptoms: Secondary | ICD-10-CM | POA: Diagnosis not present

## 2021-01-10 DIAGNOSIS — N400 Enlarged prostate without lower urinary tract symptoms: Secondary | ICD-10-CM | POA: Diagnosis not present

## 2021-01-10 DIAGNOSIS — R1310 Dysphagia, unspecified: Secondary | ICD-10-CM | POA: Diagnosis not present

## 2021-01-10 DIAGNOSIS — I519 Heart disease, unspecified: Secondary | ICD-10-CM | POA: Diagnosis not present

## 2021-01-10 DIAGNOSIS — Z96653 Presence of artificial knee joint, bilateral: Secondary | ICD-10-CM | POA: Diagnosis not present

## 2021-01-10 DIAGNOSIS — N3281 Overactive bladder: Secondary | ICD-10-CM | POA: Diagnosis not present

## 2021-01-10 DIAGNOSIS — R269 Unspecified abnormalities of gait and mobility: Secondary | ICD-10-CM | POA: Diagnosis not present

## 2021-01-10 DIAGNOSIS — F418 Other specified anxiety disorders: Secondary | ICD-10-CM | POA: Diagnosis not present

## 2021-01-10 DIAGNOSIS — Z7901 Long term (current) use of anticoagulants: Secondary | ICD-10-CM | POA: Diagnosis not present

## 2021-01-10 DIAGNOSIS — E782 Mixed hyperlipidemia: Secondary | ICD-10-CM | POA: Diagnosis not present

## 2021-01-10 DIAGNOSIS — I872 Venous insufficiency (chronic) (peripheral): Secondary | ICD-10-CM | POA: Diagnosis not present

## 2021-01-10 DIAGNOSIS — K219 Gastro-esophageal reflux disease without esophagitis: Secondary | ICD-10-CM | POA: Diagnosis not present

## 2021-01-10 DIAGNOSIS — D696 Thrombocytopenia, unspecified: Secondary | ICD-10-CM | POA: Diagnosis not present

## 2021-01-10 DIAGNOSIS — G20 Parkinson's disease: Secondary | ICD-10-CM | POA: Diagnosis not present

## 2021-01-10 DIAGNOSIS — F39 Unspecified mood [affective] disorder: Secondary | ICD-10-CM | POA: Diagnosis not present

## 2021-01-11 ENCOUNTER — Other Ambulatory Visit (HOSPITAL_COMMUNITY): Payer: Self-pay | Admitting: *Deleted

## 2021-01-11 DIAGNOSIS — R059 Cough, unspecified: Secondary | ICD-10-CM

## 2021-01-11 DIAGNOSIS — R1310 Dysphagia, unspecified: Secondary | ICD-10-CM

## 2021-01-14 ENCOUNTER — Ambulatory Visit (HOSPITAL_COMMUNITY)
Admission: RE | Admit: 2021-01-14 | Discharge: 2021-01-14 | Disposition: A | Discharge status: Home / Self Care | Payer: Medicare Other | Source: Ambulatory Visit | Attending: Internal Medicine | Admitting: Internal Medicine

## 2021-01-14 ENCOUNTER — Other Ambulatory Visit: Payer: Self-pay

## 2021-01-14 DIAGNOSIS — R1310 Dysphagia, unspecified: Secondary | ICD-10-CM | POA: Insufficient documentation

## 2021-01-14 DIAGNOSIS — R059 Cough, unspecified: Secondary | ICD-10-CM | POA: Diagnosis not present

## 2021-01-14 DIAGNOSIS — E782 Mixed hyperlipidemia: Secondary | ICD-10-CM | POA: Diagnosis not present

## 2021-01-14 DIAGNOSIS — Z23 Encounter for immunization: Secondary | ICD-10-CM | POA: Diagnosis not present

## 2021-01-14 DIAGNOSIS — I10 Essential (primary) hypertension: Secondary | ICD-10-CM | POA: Diagnosis not present

## 2021-01-14 NOTE — Progress Notes (Signed)
Modified Barium Swallow Progress Note  Patient Details  Name: Brian [name] MRN: [id number] Date of Birth: 01-21-1941  Today's Date: 01/14/2021  Modified Barium Swallow completed.  Full report located under Chart Review in the Imaging Section.  Brief recommendations include the following:  Clinical Impression  Pt demonstrates a moderate oral and oropharyngeal dysphagia secondary to Parkinsons disease. Pt with adequate strength but slow movement and decreased ROM in 75% of efforts. Pts has gradual lingual pumping as a mechanism for liquids and puree bolus propulsion resulting in spillage with prolonged pharyngeal pooling with ongoing lingual pumping prior to swallow initiation. Pt with instances of sensed aspiration before the swallow and also trace silent aspiration after the swallow due to ongoing spillage of lingual and base of tongue residue to pharynx post swallow. Helpful strategies were controlling rate and avoid consecutive sipping,  fully upright head positioning (not tilted left) and thickening liquids. However, at this time pt has not had any negative consequence of trace aspiration of thin liquids. Thickening likely to impact pts pleasure with liquids and quantity of intake. Would not yet recommend thickening; may consider thickening if pt declines further or struggles with acute illness. Consider RMT, education of oral hygiene, and training on throat clearing. Oral holding expected at times, slight posterior head or application of a dry spoon may aid in swallow trigger. Crush meds; pt cannot orally transit whole pills. F/u with home health SLP.   Swallow Evaluation Recommendations       SLP Diet Recommendations: Regular solids;Thin liquid   Liquid Administration via: Straw   Medication Administration: Crushed with puree   Supervision: Patient able to self feed   Compensations: Slow rate;Small sips/bites;Clear throat intermittently       Oral Care Recommendations: Oral care  BID        Esco Joslyn, Katherene Ponto 01/14/2021,3:08 PM

## 2021-01-15 DIAGNOSIS — I4821 Permanent atrial fibrillation: Secondary | ICD-10-CM | POA: Diagnosis not present

## 2021-01-15 DIAGNOSIS — R1312 Dysphagia, oropharyngeal phase: Secondary | ICD-10-CM | POA: Diagnosis not present

## 2021-01-15 DIAGNOSIS — I130 Hypertensive heart and chronic kidney disease with heart failure and stage 1 through stage 4 chronic kidney disease, or unspecified chronic kidney disease: Secondary | ICD-10-CM | POA: Diagnosis not present

## 2021-01-15 DIAGNOSIS — F0282 Dementia in other diseases classified elsewhere, unspecified severity, with psychotic disturbance: Secondary | ICD-10-CM | POA: Diagnosis not present

## 2021-01-15 DIAGNOSIS — G20 Parkinson's disease: Secondary | ICD-10-CM | POA: Diagnosis not present

## 2021-01-15 DIAGNOSIS — I5032 Chronic diastolic (congestive) heart failure: Secondary | ICD-10-CM | POA: Diagnosis not present

## 2021-01-16 DIAGNOSIS — G20 Parkinson's disease: Secondary | ICD-10-CM | POA: Diagnosis not present

## 2021-01-16 DIAGNOSIS — I4821 Permanent atrial fibrillation: Secondary | ICD-10-CM | POA: Diagnosis not present

## 2021-01-16 DIAGNOSIS — R1312 Dysphagia, oropharyngeal phase: Secondary | ICD-10-CM | POA: Diagnosis not present

## 2021-01-16 DIAGNOSIS — F0282 Dementia in other diseases classified elsewhere, unspecified severity, with psychotic disturbance: Secondary | ICD-10-CM | POA: Diagnosis not present

## 2021-01-16 DIAGNOSIS — I130 Hypertensive heart and chronic kidney disease with heart failure and stage 1 through stage 4 chronic kidney disease, or unspecified chronic kidney disease: Secondary | ICD-10-CM | POA: Diagnosis not present

## 2021-01-16 DIAGNOSIS — I5032 Chronic diastolic (congestive) heart failure: Secondary | ICD-10-CM | POA: Diagnosis not present

## 2021-01-17 DIAGNOSIS — I130 Hypertensive heart and chronic kidney disease with heart failure and stage 1 through stage 4 chronic kidney disease, or unspecified chronic kidney disease: Secondary | ICD-10-CM | POA: Diagnosis not present

## 2021-01-17 DIAGNOSIS — F0282 Dementia in other diseases classified elsewhere, unspecified severity, with psychotic disturbance: Secondary | ICD-10-CM | POA: Diagnosis not present

## 2021-01-17 DIAGNOSIS — I4821 Permanent atrial fibrillation: Secondary | ICD-10-CM | POA: Diagnosis not present

## 2021-01-17 DIAGNOSIS — R1312 Dysphagia, oropharyngeal phase: Secondary | ICD-10-CM | POA: Diagnosis not present

## 2021-01-17 DIAGNOSIS — G20 Parkinson's disease: Secondary | ICD-10-CM | POA: Diagnosis not present

## 2021-01-17 DIAGNOSIS — I5032 Chronic diastolic (congestive) heart failure: Secondary | ICD-10-CM | POA: Diagnosis not present

## 2021-01-18 DIAGNOSIS — I5032 Chronic diastolic (congestive) heart failure: Secondary | ICD-10-CM | POA: Diagnosis not present

## 2021-01-18 DIAGNOSIS — F0282 Dementia in other diseases classified elsewhere, unspecified severity, with psychotic disturbance: Secondary | ICD-10-CM | POA: Diagnosis not present

## 2021-01-18 DIAGNOSIS — I130 Hypertensive heart and chronic kidney disease with heart failure and stage 1 through stage 4 chronic kidney disease, or unspecified chronic kidney disease: Secondary | ICD-10-CM | POA: Diagnosis not present

## 2021-01-18 DIAGNOSIS — I4821 Permanent atrial fibrillation: Secondary | ICD-10-CM | POA: Diagnosis not present

## 2021-01-18 DIAGNOSIS — R1312 Dysphagia, oropharyngeal phase: Secondary | ICD-10-CM | POA: Diagnosis not present

## 2021-01-18 DIAGNOSIS — G20 Parkinson's disease: Secondary | ICD-10-CM | POA: Diagnosis not present

## 2021-01-20 ENCOUNTER — Other Ambulatory Visit: Payer: Self-pay | Admitting: Neurology

## 2021-01-22 DIAGNOSIS — R1312 Dysphagia, oropharyngeal phase: Secondary | ICD-10-CM | POA: Diagnosis not present

## 2021-01-22 DIAGNOSIS — I130 Hypertensive heart and chronic kidney disease with heart failure and stage 1 through stage 4 chronic kidney disease, or unspecified chronic kidney disease: Secondary | ICD-10-CM | POA: Diagnosis not present

## 2021-01-22 DIAGNOSIS — I4821 Permanent atrial fibrillation: Secondary | ICD-10-CM | POA: Diagnosis not present

## 2021-01-22 DIAGNOSIS — I5032 Chronic diastolic (congestive) heart failure: Secondary | ICD-10-CM | POA: Diagnosis not present

## 2021-01-22 DIAGNOSIS — F0282 Dementia in other diseases classified elsewhere, unspecified severity, with psychotic disturbance: Secondary | ICD-10-CM | POA: Diagnosis not present

## 2021-01-22 DIAGNOSIS — G20 Parkinson's disease: Secondary | ICD-10-CM | POA: Diagnosis not present

## 2021-01-24 DIAGNOSIS — I5032 Chronic diastolic (congestive) heart failure: Secondary | ICD-10-CM | POA: Diagnosis not present

## 2021-01-24 DIAGNOSIS — M1990 Unspecified osteoarthritis, unspecified site: Secondary | ICD-10-CM | POA: Diagnosis not present

## 2021-01-24 DIAGNOSIS — F0390 Unspecified dementia without behavioral disturbance: Secondary | ICD-10-CM | POA: Diagnosis not present

## 2021-01-24 DIAGNOSIS — I130 Hypertensive heart and chronic kidney disease with heart failure and stage 1 through stage 4 chronic kidney disease, or unspecified chronic kidney disease: Secondary | ICD-10-CM | POA: Diagnosis not present

## 2021-01-24 DIAGNOSIS — N400 Enlarged prostate without lower urinary tract symptoms: Secondary | ICD-10-CM | POA: Diagnosis not present

## 2021-01-24 DIAGNOSIS — E782 Mixed hyperlipidemia: Secondary | ICD-10-CM | POA: Diagnosis not present

## 2021-01-24 DIAGNOSIS — G20 Parkinson's disease: Secondary | ICD-10-CM | POA: Diagnosis not present

## 2021-01-24 DIAGNOSIS — I10 Essential (primary) hypertension: Secondary | ICD-10-CM | POA: Diagnosis not present

## 2021-01-24 DIAGNOSIS — N182 Chronic kidney disease, stage 2 (mild): Secondary | ICD-10-CM | POA: Diagnosis not present

## 2021-01-24 DIAGNOSIS — F0282 Dementia in other diseases classified elsewhere, unspecified severity, with psychotic disturbance: Secondary | ICD-10-CM | POA: Diagnosis not present

## 2021-01-24 DIAGNOSIS — K219 Gastro-esophageal reflux disease without esophagitis: Secondary | ICD-10-CM | POA: Diagnosis not present

## 2021-01-24 DIAGNOSIS — I4821 Permanent atrial fibrillation: Secondary | ICD-10-CM | POA: Diagnosis not present

## 2021-01-24 DIAGNOSIS — R1312 Dysphagia, oropharyngeal phase: Secondary | ICD-10-CM | POA: Diagnosis not present

## 2021-01-28 ENCOUNTER — Telehealth: Payer: Self-pay | Admitting: Neurology

## 2021-01-28 NOTE — Telephone Encounter (Signed)
Sharyn Lull, RN with [facility] called stating pt's wife contacted them and told them that she was having a hard time giving the pt the carbidopa-levodopa (SINEMET IR) 25-100 MG tablet every 2 hours. Wanting to know if there is another schedule to follow. Sharyn Lull requesting a call back.

## 2021-01-28 NOTE — Telephone Encounter (Signed)
LMVM for pts wife to return call.   

## 2021-01-29 DIAGNOSIS — I4821 Permanent atrial fibrillation: Secondary | ICD-10-CM | POA: Diagnosis not present

## 2021-01-29 DIAGNOSIS — I5032 Chronic diastolic (congestive) heart failure: Secondary | ICD-10-CM | POA: Diagnosis not present

## 2021-01-29 DIAGNOSIS — R1312 Dysphagia, oropharyngeal phase: Secondary | ICD-10-CM | POA: Diagnosis not present

## 2021-01-29 DIAGNOSIS — G20 Parkinson's disease: Secondary | ICD-10-CM | POA: Diagnosis not present

## 2021-01-29 DIAGNOSIS — I130 Hypertensive heart and chronic kidney disease with heart failure and stage 1 through stage 4 chronic kidney disease, or unspecified chronic kidney disease: Secondary | ICD-10-CM | POA: Diagnosis not present

## 2021-01-29 DIAGNOSIS — F0282 Dementia in other diseases classified elsewhere, unspecified severity, with psychotic disturbance: Secondary | ICD-10-CM | POA: Diagnosis not present

## 2021-01-30 DIAGNOSIS — I4821 Permanent atrial fibrillation: Secondary | ICD-10-CM | POA: Diagnosis not present

## 2021-01-30 DIAGNOSIS — R1312 Dysphagia, oropharyngeal phase: Secondary | ICD-10-CM | POA: Diagnosis not present

## 2021-01-30 DIAGNOSIS — I5032 Chronic diastolic (congestive) heart failure: Secondary | ICD-10-CM | POA: Diagnosis not present

## 2021-01-30 DIAGNOSIS — I130 Hypertensive heart and chronic kidney disease with heart failure and stage 1 through stage 4 chronic kidney disease, or unspecified chronic kidney disease: Secondary | ICD-10-CM | POA: Diagnosis not present

## 2021-01-30 DIAGNOSIS — G20 Parkinson's disease: Secondary | ICD-10-CM | POA: Diagnosis not present

## 2021-01-30 DIAGNOSIS — F0282 Dementia in other diseases classified elsewhere, unspecified severity, with psychotic disturbance: Secondary | ICD-10-CM | POA: Diagnosis not present

## 2021-01-30 NOTE — Telephone Encounter (Signed)
Combining 4 pills together is too much in one dose. I would recommend, if anything to do 2 pills 4 times a day or 3 times a day.

## 2021-01-30 NOTE — Telephone Encounter (Signed)
I called patient's wife.  I relayed Dr. Guadelupe Sabin message that taking 4 tablets at 1 dose is too much and she recommended for him to take 2 tablets 4 times a day of the Sinemet 25/100 mg tablets or could even do 2 tablets 3 times a day.  She verbalized understanding . She appreciated call back and she will call us back as needed.  I did relay that I spoke to [place] with [facility] as well.

## 2021-01-30 NOTE — Telephone Encounter (Signed)
I called Deidre ST with [facility] along with wife, placed on speaker phone.  Pt is bedridden, and is using wheelchair.  Getting medication crushed tablets due to pt endurance and fatigue (sleeps a lot) has been giving the Carbidopa Levadopa 25/100 4 tabs TID (with meals).  This as been going on for last 2 months.  Seems to have tolerated this per ST/Wife. The question is continue with 4 tabs TID or go to 2 tabs TID.  Please advise.  Tremors seem stable.

## 2021-02-04 DIAGNOSIS — R1312 Dysphagia, oropharyngeal phase: Secondary | ICD-10-CM | POA: Diagnosis not present

## 2021-02-04 DIAGNOSIS — G20 Parkinson's disease: Secondary | ICD-10-CM | POA: Diagnosis not present

## 2021-02-04 DIAGNOSIS — F0282 Dementia in other diseases classified elsewhere, unspecified severity, with psychotic disturbance: Secondary | ICD-10-CM | POA: Diagnosis not present

## 2021-02-04 DIAGNOSIS — I4821 Permanent atrial fibrillation: Secondary | ICD-10-CM | POA: Diagnosis not present

## 2021-02-04 DIAGNOSIS — I5032 Chronic diastolic (congestive) heart failure: Secondary | ICD-10-CM | POA: Diagnosis not present

## 2021-02-04 DIAGNOSIS — I130 Hypertensive heart and chronic kidney disease with heart failure and stage 1 through stage 4 chronic kidney disease, or unspecified chronic kidney disease: Secondary | ICD-10-CM | POA: Diagnosis not present

## 2021-02-06 DIAGNOSIS — I5032 Chronic diastolic (congestive) heart failure: Secondary | ICD-10-CM | POA: Diagnosis not present

## 2021-02-06 DIAGNOSIS — F0282 Dementia in other diseases classified elsewhere, unspecified severity, with psychotic disturbance: Secondary | ICD-10-CM | POA: Diagnosis not present

## 2021-02-06 DIAGNOSIS — G20 Parkinson's disease: Secondary | ICD-10-CM | POA: Diagnosis not present

## 2021-02-06 DIAGNOSIS — I4821 Permanent atrial fibrillation: Secondary | ICD-10-CM | POA: Diagnosis not present

## 2021-02-06 DIAGNOSIS — R1312 Dysphagia, oropharyngeal phase: Secondary | ICD-10-CM | POA: Diagnosis not present

## 2021-02-06 DIAGNOSIS — I130 Hypertensive heart and chronic kidney disease with heart failure and stage 1 through stage 4 chronic kidney disease, or unspecified chronic kidney disease: Secondary | ICD-10-CM | POA: Diagnosis not present

## 2021-02-07 DIAGNOSIS — I4821 Permanent atrial fibrillation: Secondary | ICD-10-CM | POA: Diagnosis not present

## 2021-02-07 DIAGNOSIS — F0282 Dementia in other diseases classified elsewhere, unspecified severity, with psychotic disturbance: Secondary | ICD-10-CM | POA: Diagnosis not present

## 2021-02-07 DIAGNOSIS — R1312 Dysphagia, oropharyngeal phase: Secondary | ICD-10-CM | POA: Diagnosis not present

## 2021-02-07 DIAGNOSIS — I130 Hypertensive heart and chronic kidney disease with heart failure and stage 1 through stage 4 chronic kidney disease, or unspecified chronic kidney disease: Secondary | ICD-10-CM | POA: Diagnosis not present

## 2021-02-07 DIAGNOSIS — I5032 Chronic diastolic (congestive) heart failure: Secondary | ICD-10-CM | POA: Diagnosis not present

## 2021-02-07 DIAGNOSIS — G20 Parkinson's disease: Secondary | ICD-10-CM | POA: Diagnosis not present

## 2021-02-09 DIAGNOSIS — F418 Other specified anxiety disorders: Secondary | ICD-10-CM | POA: Diagnosis not present

## 2021-02-09 DIAGNOSIS — Z96653 Presence of artificial knee joint, bilateral: Secondary | ICD-10-CM | POA: Diagnosis not present

## 2021-02-09 DIAGNOSIS — N39498 Other specified urinary incontinence: Secondary | ICD-10-CM | POA: Diagnosis not present

## 2021-02-09 DIAGNOSIS — N182 Chronic kidney disease, stage 2 (mild): Secondary | ICD-10-CM | POA: Diagnosis not present

## 2021-02-09 DIAGNOSIS — G4752 REM sleep behavior disorder: Secondary | ICD-10-CM | POA: Diagnosis not present

## 2021-02-09 DIAGNOSIS — G20 Parkinson's disease: Secondary | ICD-10-CM | POA: Diagnosis not present

## 2021-02-09 DIAGNOSIS — Z7901 Long term (current) use of anticoagulants: Secondary | ICD-10-CM | POA: Diagnosis not present

## 2021-02-09 DIAGNOSIS — R1312 Dysphagia, oropharyngeal phase: Secondary | ICD-10-CM | POA: Diagnosis not present

## 2021-02-09 DIAGNOSIS — N529 Male erectile dysfunction, unspecified: Secondary | ICD-10-CM | POA: Diagnosis not present

## 2021-02-09 DIAGNOSIS — N3281 Overactive bladder: Secondary | ICD-10-CM | POA: Diagnosis not present

## 2021-02-09 DIAGNOSIS — I130 Hypertensive heart and chronic kidney disease with heart failure and stage 1 through stage 4 chronic kidney disease, or unspecified chronic kidney disease: Secondary | ICD-10-CM | POA: Diagnosis not present

## 2021-02-09 DIAGNOSIS — K219 Gastro-esophageal reflux disease without esophagitis: Secondary | ICD-10-CM | POA: Diagnosis not present

## 2021-02-09 DIAGNOSIS — I4821 Permanent atrial fibrillation: Secondary | ICD-10-CM | POA: Diagnosis not present

## 2021-02-09 DIAGNOSIS — I5032 Chronic diastolic (congestive) heart failure: Secondary | ICD-10-CM | POA: Diagnosis not present

## 2021-02-09 DIAGNOSIS — I872 Venous insufficiency (chronic) (peripheral): Secondary | ICD-10-CM | POA: Diagnosis not present

## 2021-02-09 DIAGNOSIS — E782 Mixed hyperlipidemia: Secondary | ICD-10-CM | POA: Diagnosis not present

## 2021-02-09 DIAGNOSIS — N401 Enlarged prostate with lower urinary tract symptoms: Secondary | ICD-10-CM | POA: Diagnosis not present

## 2021-02-09 DIAGNOSIS — Z87440 Personal history of urinary (tract) infections: Secondary | ICD-10-CM | POA: Diagnosis not present

## 2021-02-09 DIAGNOSIS — F0282 Dementia in other diseases classified elsewhere, unspecified severity, with psychotic disturbance: Secondary | ICD-10-CM | POA: Diagnosis not present

## 2021-02-09 DIAGNOSIS — R338 Other retention of urine: Secondary | ICD-10-CM | POA: Diagnosis not present

## 2021-02-09 DIAGNOSIS — D696 Thrombocytopenia, unspecified: Secondary | ICD-10-CM | POA: Diagnosis not present

## 2021-02-09 DIAGNOSIS — Z9181 History of falling: Secondary | ICD-10-CM | POA: Diagnosis not present

## 2021-02-18 DIAGNOSIS — I482 Chronic atrial fibrillation, unspecified: Secondary | ICD-10-CM | POA: Diagnosis not present

## 2021-02-18 DIAGNOSIS — F39 Unspecified mood [affective] disorder: Secondary | ICD-10-CM | POA: Diagnosis not present

## 2021-02-18 DIAGNOSIS — N400 Enlarged prostate without lower urinary tract symptoms: Secondary | ICD-10-CM | POA: Diagnosis not present

## 2021-02-18 DIAGNOSIS — R269 Unspecified abnormalities of gait and mobility: Secondary | ICD-10-CM | POA: Diagnosis not present

## 2021-02-18 DIAGNOSIS — E782 Mixed hyperlipidemia: Secondary | ICD-10-CM | POA: Diagnosis not present

## 2021-02-18 DIAGNOSIS — F0390 Unspecified dementia without behavioral disturbance: Secondary | ICD-10-CM | POA: Diagnosis not present

## 2021-02-18 DIAGNOSIS — G20 Parkinson's disease: Secondary | ICD-10-CM | POA: Diagnosis not present

## 2021-02-18 DIAGNOSIS — F419 Anxiety disorder, unspecified: Secondary | ICD-10-CM | POA: Diagnosis not present

## 2021-02-18 DIAGNOSIS — I10 Essential (primary) hypertension: Secondary | ICD-10-CM | POA: Diagnosis not present

## 2021-02-18 DIAGNOSIS — D696 Thrombocytopenia, unspecified: Secondary | ICD-10-CM | POA: Diagnosis not present

## 2021-02-18 DIAGNOSIS — I519 Heart disease, unspecified: Secondary | ICD-10-CM | POA: Diagnosis not present

## 2021-02-18 DIAGNOSIS — D6869 Other thrombophilia: Secondary | ICD-10-CM | POA: Diagnosis not present

## 2021-02-19 DIAGNOSIS — I130 Hypertensive heart and chronic kidney disease with heart failure and stage 1 through stage 4 chronic kidney disease, or unspecified chronic kidney disease: Secondary | ICD-10-CM | POA: Diagnosis not present

## 2021-02-19 DIAGNOSIS — R1312 Dysphagia, oropharyngeal phase: Secondary | ICD-10-CM | POA: Diagnosis not present

## 2021-02-19 DIAGNOSIS — I4821 Permanent atrial fibrillation: Secondary | ICD-10-CM | POA: Diagnosis not present

## 2021-02-19 DIAGNOSIS — G20 Parkinson's disease: Secondary | ICD-10-CM | POA: Diagnosis not present

## 2021-02-19 DIAGNOSIS — I5032 Chronic diastolic (congestive) heart failure: Secondary | ICD-10-CM | POA: Diagnosis not present

## 2021-02-19 DIAGNOSIS — F0282 Dementia in other diseases classified elsewhere, unspecified severity, with psychotic disturbance: Secondary | ICD-10-CM | POA: Diagnosis not present

## 2021-02-27 DIAGNOSIS — K219 Gastro-esophageal reflux disease without esophagitis: Secondary | ICD-10-CM | POA: Diagnosis not present

## 2021-02-27 DIAGNOSIS — M1990 Unspecified osteoarthritis, unspecified site: Secondary | ICD-10-CM | POA: Diagnosis not present

## 2021-02-27 DIAGNOSIS — N400 Enlarged prostate without lower urinary tract symptoms: Secondary | ICD-10-CM | POA: Diagnosis not present

## 2021-02-27 DIAGNOSIS — E782 Mixed hyperlipidemia: Secondary | ICD-10-CM | POA: Diagnosis not present

## 2021-02-27 DIAGNOSIS — G20 Parkinson's disease: Secondary | ICD-10-CM | POA: Diagnosis not present

## 2021-02-27 DIAGNOSIS — I10 Essential (primary) hypertension: Secondary | ICD-10-CM | POA: Diagnosis not present

## 2021-02-27 DIAGNOSIS — F0390 Unspecified dementia without behavioral disturbance: Secondary | ICD-10-CM | POA: Diagnosis not present

## 2021-02-27 DIAGNOSIS — N182 Chronic kidney disease, stage 2 (mild): Secondary | ICD-10-CM | POA: Diagnosis not present

## 2021-03-07 ENCOUNTER — Ambulatory Visit: Payer: Medicare Other | Admitting: Neurology

## 2021-04-15 DIAGNOSIS — K219 Gastro-esophageal reflux disease without esophagitis: Secondary | ICD-10-CM | POA: Diagnosis not present

## 2021-04-15 DIAGNOSIS — I10 Essential (primary) hypertension: Secondary | ICD-10-CM | POA: Diagnosis not present

## 2021-04-15 DIAGNOSIS — G20 Parkinson's disease: Secondary | ICD-10-CM | POA: Diagnosis not present

## 2021-04-15 DIAGNOSIS — M1990 Unspecified osteoarthritis, unspecified site: Secondary | ICD-10-CM | POA: Diagnosis not present

## 2021-04-15 DIAGNOSIS — N182 Chronic kidney disease, stage 2 (mild): Secondary | ICD-10-CM | POA: Diagnosis not present

## 2021-04-15 DIAGNOSIS — F0390 Unspecified dementia without behavioral disturbance: Secondary | ICD-10-CM | POA: Diagnosis not present

## 2021-04-15 DIAGNOSIS — E782 Mixed hyperlipidemia: Secondary | ICD-10-CM | POA: Diagnosis not present

## 2021-04-15 DIAGNOSIS — N400 Enlarged prostate without lower urinary tract symptoms: Secondary | ICD-10-CM | POA: Diagnosis not present

## 2021-04-22 ENCOUNTER — Other Ambulatory Visit: Payer: Self-pay | Admitting: Neurology

## 2021-04-22 DIAGNOSIS — G20 Parkinson's disease: Secondary | ICD-10-CM

## 2021-04-22 DIAGNOSIS — G4752 REM sleep behavior disorder: Secondary | ICD-10-CM

## 2021-04-22 DIAGNOSIS — F419 Anxiety disorder, unspecified: Secondary | ICD-10-CM

## 2021-04-25 ENCOUNTER — Telehealth: Payer: Self-pay | Admitting: Neurology

## 2021-04-25 NOTE — Telephone Encounter (Signed)
..   Pt understands that although there may be some limitations with this type of visit, we will take all precautions to reduce any security or privacy concerns.  Pt understands that this will be treated like an in office visit and we will file with pt's insurance, and there may be a patient responsible charge related to this service. ? ?

## 2021-05-02 ENCOUNTER — Other Ambulatory Visit: Payer: Self-pay | Admitting: Neurology

## 2021-05-02 DIAGNOSIS — G4752 REM sleep behavior disorder: Secondary | ICD-10-CM

## 2021-05-02 DIAGNOSIS — G20 Parkinson's disease: Secondary | ICD-10-CM

## 2021-05-02 DIAGNOSIS — F419 Anxiety disorder, unspecified: Secondary | ICD-10-CM

## 2021-05-08 ENCOUNTER — Other Ambulatory Visit: Payer: Self-pay | Admitting: *Deleted

## 2021-05-08 NOTE — Telephone Encounter (Signed)
We received a request from [facility] for an early refill of Clonazepam 1 mg ODT stating patient says that they have lost the prescription. They are asking for a new prescription saying its ok to refill early. The last refill was given on 04/22/2021 #90/90 per [place] registry and [place] records.   I called the pt's wife, [name] (on [place]), and LVM (ok per DPR), asking for a call back letting us know if they have by chance found the medication in the meantime. We would need to get an approval from Dr Rexene Alberts. Asked for call back. When she calls back, please ask her if they found the refill.

## 2021-05-10 ENCOUNTER — Other Ambulatory Visit: Payer: Self-pay | Admitting: Neurology

## 2021-05-10 DIAGNOSIS — G4752 REM sleep behavior disorder: Secondary | ICD-10-CM

## 2021-05-13 NOTE — Telephone Encounter (Signed)
Per Drug Registry, Clonazepam was filled on 04/22/21 for #90.   Please deny this Rx.

## 2021-05-20 ENCOUNTER — Encounter: Payer: Self-pay | Admitting: Neurology

## 2021-05-22 ENCOUNTER — Telehealth (INDEPENDENT_AMBULATORY_CARE_PROVIDER_SITE_OTHER): Payer: Self-pay | Admitting: Neurology

## 2021-05-22 NOTE — Progress Notes (Signed)
Erroneous encounter, patient not available for VV. ?

## 2021-05-23 ENCOUNTER — Telehealth: Payer: Self-pay | Admitting: *Deleted

## 2021-05-23 NOTE — Telephone Encounter (Signed)
Called wife of pt.  Appt r/s with MM/NP per Dr. Rexene Alberts for VV PD/Dementia pt. Pt to be at appt with wife.  ?

## 2021-05-27 ENCOUNTER — Telehealth: Payer: Self-pay | Admitting: Neurology

## 2021-05-27 ENCOUNTER — Ambulatory Visit: Payer: Medicare Other | Admitting: Adult Health

## 2021-05-27 NOTE — Telephone Encounter (Signed)
Pt's wife, Cristen Murcia said, have been waiting for 40 minutes for someone to get on MyChart appt. Informed NP, has been waiting. NP message back "it doesnt show on my end that she logged on. ." ?Informed Ms. Gervase would need to reschedule appt. Ms. Swor said, I want to schedule an appt with Dr. Rexene Alberts instead of the NP. ?Scheduledpatient a MyChart visit with Dr. Rexene Alberts. On 07/22/21 ?

## 2021-06-05 DIAGNOSIS — J984 Other disorders of lung: Secondary | ICD-10-CM | POA: Diagnosis not present

## 2021-06-05 DIAGNOSIS — R630 Anorexia: Secondary | ICD-10-CM | POA: Diagnosis not present

## 2021-06-05 DIAGNOSIS — K219 Gastro-esophageal reflux disease without esophagitis: Secondary | ICD-10-CM | POA: Diagnosis not present

## 2021-06-05 DIAGNOSIS — J302 Other seasonal allergic rhinitis: Secondary | ICD-10-CM | POA: Diagnosis not present

## 2021-06-05 DIAGNOSIS — R1310 Dysphagia, unspecified: Secondary | ICD-10-CM | POA: Diagnosis not present

## 2021-06-05 DIAGNOSIS — N400 Enlarged prostate without lower urinary tract symptoms: Secondary | ICD-10-CM | POA: Diagnosis not present

## 2021-06-05 DIAGNOSIS — G4733 Obstructive sleep apnea (adult) (pediatric): Secondary | ICD-10-CM | POA: Diagnosis not present

## 2021-06-05 DIAGNOSIS — R202 Paresthesia of skin: Secondary | ICD-10-CM | POA: Diagnosis not present

## 2021-06-05 DIAGNOSIS — Z87448 Personal history of other diseases of urinary system: Secondary | ICD-10-CM | POA: Diagnosis not present

## 2021-06-05 DIAGNOSIS — F0282 Dementia in other diseases classified elsewhere, unspecified severity, with psychotic disturbance: Secondary | ICD-10-CM | POA: Diagnosis not present

## 2021-06-05 DIAGNOSIS — M1990 Unspecified osteoarthritis, unspecified site: Secondary | ICD-10-CM | POA: Diagnosis not present

## 2021-06-05 DIAGNOSIS — F0283 Dementia in other diseases classified elsewhere, unspecified severity, with mood disturbance: Secondary | ICD-10-CM | POA: Diagnosis not present

## 2021-06-05 DIAGNOSIS — I4891 Unspecified atrial fibrillation: Secondary | ICD-10-CM | POA: Diagnosis not present

## 2021-06-05 DIAGNOSIS — G20 Parkinson's disease: Secondary | ICD-10-CM | POA: Diagnosis not present

## 2021-06-05 DIAGNOSIS — F0284 Dementia in other diseases classified elsewhere, unspecified severity, with anxiety: Secondary | ICD-10-CM | POA: Diagnosis not present

## 2021-06-05 DIAGNOSIS — I10 Essential (primary) hypertension: Secondary | ICD-10-CM | POA: Diagnosis not present

## 2021-06-06 DIAGNOSIS — R630 Anorexia: Secondary | ICD-10-CM | POA: Diagnosis not present

## 2021-06-06 DIAGNOSIS — F0283 Dementia in other diseases classified elsewhere, unspecified severity, with mood disturbance: Secondary | ICD-10-CM | POA: Diagnosis not present

## 2021-06-06 DIAGNOSIS — F0284 Dementia in other diseases classified elsewhere, unspecified severity, with anxiety: Secondary | ICD-10-CM | POA: Diagnosis not present

## 2021-06-06 DIAGNOSIS — G20 Parkinson's disease: Secondary | ICD-10-CM | POA: Diagnosis not present

## 2021-06-06 DIAGNOSIS — R1310 Dysphagia, unspecified: Secondary | ICD-10-CM | POA: Diagnosis not present

## 2021-06-06 DIAGNOSIS — F0282 Dementia in other diseases classified elsewhere, unspecified severity, with psychotic disturbance: Secondary | ICD-10-CM | POA: Diagnosis not present

## 2021-06-07 DIAGNOSIS — R1310 Dysphagia, unspecified: Secondary | ICD-10-CM | POA: Diagnosis not present

## 2021-06-07 DIAGNOSIS — F0283 Dementia in other diseases classified elsewhere, unspecified severity, with mood disturbance: Secondary | ICD-10-CM | POA: Diagnosis not present

## 2021-06-07 DIAGNOSIS — F0282 Dementia in other diseases classified elsewhere, unspecified severity, with psychotic disturbance: Secondary | ICD-10-CM | POA: Diagnosis not present

## 2021-06-07 DIAGNOSIS — R630 Anorexia: Secondary | ICD-10-CM | POA: Diagnosis not present

## 2021-06-07 DIAGNOSIS — F0284 Dementia in other diseases classified elsewhere, unspecified severity, with anxiety: Secondary | ICD-10-CM | POA: Diagnosis not present

## 2021-06-07 DIAGNOSIS — G20 Parkinson's disease: Secondary | ICD-10-CM | POA: Diagnosis not present

## 2021-06-10 ENCOUNTER — Encounter: Payer: Self-pay | Admitting: Cardiology

## 2021-06-10 ENCOUNTER — Encounter: Payer: Self-pay | Admitting: Neurology

## 2021-06-10 ENCOUNTER — Encounter: Payer: Self-pay | Admitting: Podiatry

## 2021-06-22 DEATH — deceased

## 2021-07-22 ENCOUNTER — Telehealth: Payer: Medicare Other | Admitting: Neurology
